# Patient Record
Sex: Male | Born: 1955
Health system: Southern US, Community
[De-identification: ages and names within clinical notes are randomized; demographics above are authoritative.]

## PROBLEM LIST (undated history)

## (undated) DIAGNOSIS — E7800 Pure hypercholesterolemia, unspecified: Secondary | ICD-10-CM

## (undated) DIAGNOSIS — H9190 Unspecified hearing loss, unspecified ear: Secondary | ICD-10-CM

## (undated) DIAGNOSIS — I639 Cerebral infarction, unspecified: Secondary | ICD-10-CM

## (undated) DIAGNOSIS — E559 Vitamin D deficiency, unspecified: Secondary | ICD-10-CM

## (undated) DIAGNOSIS — N189 Chronic kidney disease, unspecified: Secondary | ICD-10-CM

## (undated) DIAGNOSIS — K219 Gastro-esophageal reflux disease without esophagitis: Secondary | ICD-10-CM

## (undated) DIAGNOSIS — J449 Chronic obstructive pulmonary disease, unspecified: Secondary | ICD-10-CM

## (undated) DIAGNOSIS — I10 Essential (primary) hypertension: Secondary | ICD-10-CM

## (undated) HISTORY — PX: CHOLECYSTECTOMY: SHX55

## (undated) HISTORY — PX: TONSILLECTOMY: SUR1361

## (undated) HISTORY — PX: ELBOW FRACTURE SURGERY: SHX616

## (undated) HISTORY — PX: HERNIA REPAIR: SHX51

## (undated) HISTORY — PX: CATARACT EXTRACTION: SUR2

## (undated) HISTORY — PX: SPLENECTOMY, TOTAL: SHX788

## (undated) HISTORY — PX: VOCAL CORD INJECTION: SHX2663

## (undated) HISTORY — PX: HYDROCELE EXCISION / REPAIR: SUR1145

---

## 2009-02-16 ENCOUNTER — Encounter (INDEPENDENT_AMBULATORY_CARE_PROVIDER_SITE_OTHER): Payer: Self-pay | Admitting: Otolaryngology

## 2009-02-16 ENCOUNTER — Ambulatory Visit (HOSPITAL_COMMUNITY): Admission: RE | Admit: 2009-02-16 | Discharge: 2009-02-16 | Payer: Self-pay | Admitting: Otolaryngology

## 2011-02-13 LAB — CBC
HCT: 44.7 % (ref 39.0–52.0)
Hemoglobin: 15.2 g/dL (ref 13.0–17.0)
MCHC: 34 g/dL (ref 30.0–36.0)
MCV: 92.9 fL (ref 78.0–100.0)
Platelets: 212 10*3/uL (ref 150–400)
RBC: 4.81 MIL/uL (ref 4.22–5.81)
RDW: 13.8 % (ref 11.5–15.5)
WBC: 9.6 10*3/uL (ref 4.0–10.5)

## 2011-02-13 LAB — BASIC METABOLIC PANEL
CO2: 23 mEq/L (ref 19–32)
Calcium: 9.2 mg/dL (ref 8.4–10.5)
Creatinine, Ser: 0.97 mg/dL (ref 0.4–1.5)
GFR calc Af Amer: >60 mL/min (ref >60)
GFR calc non Af Amer: >60 mL/min (ref >60)
Sodium: 138 mEq/L (ref 135–145)

## 2011-03-19 NOTE — Op Note (Signed)
NAME:  Ernest Barnett, Ernest Barnett                ACCOUNT NO.:  [id number]   MEDICAL RECORD NO.:  [id number]          PATIENT TYPE:  AMB   LOCATION:  SDS                          FACILITY:  [facility]   PHYSICIAN:  Melissa Montane, M.D.       DATE OF BIRTH:  18-Dec-1955   DATE OF PROCEDURE:  02/16/2009  DATE OF DISCHARGE:  02/16/2009                               OPERATIVE REPORT   PREOPERATIVE DIAGNOSIS:  Right large vocal cord polyp.   POSTOPERATIVE DIAGNOSIS:  Right large vocal cord polyp.   SURGICAL PROCEDURE:  Microlaryngoscopy with removal of polyp and  microflap.   ANESTHESIA:  General.   ESTIMATED BLOOD LOSS:  Less than 5 mL.   INDICATIONS:  This is a 55 year old with extremely significant  hoarseness and large polyp on his vocal cord that is ball-valving in the  glottis.  He wants to have this removed.  We discussed the procedure.  We discussed risks and benefits as well as options.  All his questions  were answered and consent was obtained.   OPERATION:  The patient was taken to the operating room and placed  supine position.  After general endotracheal tube anesthesia with a  small #6 tube, the patient was examined with the [facility] scope.  It was  then positioned in the larynx and secured and suspended from the [place]  stand.  Using the microscissors, the polyp was grasped and dissection  was started on the lateral aspect of the vocal cord.  The dissection was  done to flay the polyp medially and then starting from the top it was  dissected using the suction to remove some of the thickened material as  well as dissection with the scissors.  Once this was removed, it was a  very large polyp.  The flap was then laid back down over the raw surface  of the vocal cord creating a nice vocal fold edge.  Blood was all  suctioned out and hemostasis was achieved with a pledget.  This was  soaked with adrenaline.  The patient was then examined.  There was no  other lesions identified in his larynx or  hypopharynx.  Awakened and  brought to recovery room in stable condition.  Counts correct.           ______________________________  Melissa Montane, M.D.     JB/MEDQ  D:  02/16/2009  T:  02/17/2009  Job:  [id number]

## 2011-11-05 DIAGNOSIS — I639 Cerebral infarction, unspecified: Secondary | ICD-10-CM

## 2011-11-05 HISTORY — DX: Cerebral infarction, unspecified: I63.9

## 2012-03-24 DIAGNOSIS — I639 Cerebral infarction, unspecified: Secondary | ICD-10-CM

## 2012-03-24 HISTORY — DX: Cerebral infarction, unspecified: I63.9

## 2012-10-06 ENCOUNTER — Encounter (HOSPITAL_COMMUNITY): Payer: Self-pay | Admitting: Pharmacy Technician

## 2012-10-06 ENCOUNTER — Encounter (HOSPITAL_COMMUNITY): Payer: Self-pay

## 2012-10-06 ENCOUNTER — Encounter (HOSPITAL_COMMUNITY)
Admission: RE | Admit: 2012-10-06 | Discharge: 2012-10-06 | Disposition: A | Discharge status: Home / Self Care | Payer: Medicaid Other | Source: Ambulatory Visit | Attending: Ophthalmology | Admitting: Ophthalmology

## 2012-10-06 HISTORY — DX: Unspecified hearing loss, unspecified ear: H91.90

## 2012-10-06 HISTORY — DX: Vitamin D deficiency, unspecified: E55.9

## 2012-10-06 HISTORY — DX: Essential (primary) hypertension: I10

## 2012-10-06 HISTORY — DX: Chronic obstructive pulmonary disease, unspecified: J44.9

## 2012-10-06 HISTORY — DX: Cerebral infarction, unspecified: I63.9

## 2012-10-06 HISTORY — DX: Pure hypercholesterolemia, unspecified: E78.00

## 2012-10-06 LAB — BASIC METABOLIC PANEL
Chloride: 103 mEq/L (ref 96–112)
GFR calc Af Amer: 73 mL/min — ABNORMAL LOW (ref >90)
GFR calc non Af Amer: 63 mL/min — ABNORMAL LOW (ref >90)
Glucose, Bld: 105 mg/dL — ABNORMAL HIGH (ref 70–99)
Potassium: 4.4 mEq/L (ref 3.5–5.1)
Sodium: 138 mEq/L (ref 135–145)

## 2012-10-06 LAB — HEMOGLOBIN AND HEMATOCRIT, BLOOD: HCT: 42.4 % (ref 39.0–52.0)

## 2012-10-06 NOTE — Patient Instructions (Addendum)
Your procedure is scheduled on: 10/08/2012  Report to [facility] at 1200   PM.  Call this number if you have problems the morning of surgery: [phone]   Do not eat food or drink liquids :After Midnight.      Take these medicines the morning of surgery with A SIP OF WATER: lisinopril   Do not wear jewelry, make-up or nail polish.  Do not wear lotions, powders, or perfumes. You may wear deodorant.  Do not shave 48 hours prior to surgery.  Do not bring valuables to the hospital.  Contacts, dentures or bridgework may not be worn into surgery.  Leave suitcase in the car. After surgery it may be brought to your room.  For patients admitted to the hospital, checkout time is 11:00 AM the day of discharge.   Patients discharged the day of surgery will not be allowed to drive home.  :     Please read over the following fact sheets that you were given: Coughing and Deep Breathing, Surgical Site Infection Prevention, Anesthesia Post-op Instructions and Care and Recovery After Surgery    Cataract A cataract is a clouding of the lens of the eye. When a lens becomes cloudy, vision is reduced based on the degree and nature of the clouding. Many cataracts reduce vision to some degree. Some cataracts make people more near-sighted as they develop. Other cataracts increase glare. Cataracts that are ignored and become worse can sometimes look white. The white color can be seen through the pupil. CAUSES   Aging. However, cataracts may occur at any age, even in newborns.   Certain drugs.   Trauma to the eye.   Certain diseases such as diabetes.   Specific eye diseases such as chronic inflammation inside the eye or a sudden attack of a rare form of glaucoma.   Inherited or acquired medical problems.  SYMPTOMS   Gradual, progressive drop in vision in the affected eye.   Severe, rapid visual loss. This most often happens when trauma is the cause.  DIAGNOSIS  To detect a cataract, an eye doctor  examines the lens. Cataracts are best diagnosed with an exam of the eyes with the pupils enlarged (dilated) by drops.  TREATMENT  For an early cataract, vision may improve by using different eyeglasses or stronger lighting. If that does not help your vision, surgery is the only effective treatment. A cataract needs to be surgically removed when vision loss interferes with your everyday activities, such as driving, reading, or watching TV. A cataract may also have to be removed if it prevents examination or treatment of another eye problem. Surgery removes the cloudy lens and usually replaces it with a substitute lens (intraocular lens, IOL).  At a time when both you and your doctor agree, the cataract will be surgically removed. If you have cataracts in both eyes, only one is usually removed at a time. This allows the operated eye to heal and be out of danger from any possible problems after surgery (such as infection or poor wound healing). In rare cases, a cataract may be doing damage to your eye. In these cases, your caregiver may advise surgical removal right away. The vast majority of people who have cataract surgery have better vision afterward. HOME CARE INSTRUCTIONS  If you are not planning surgery, you may be asked to do the following:  Use different eyeglasses.   Use stronger or brighter lighting.   Ask your eye doctor about reducing your medicine dose or  changing medicines if it is thought that a medicine caused your cataract. Changing medicines does not make the cataract go away on its own.   Become familiar with your surroundings. Poor vision can lead to injury. Avoid bumping into things on the affected side. You are at a higher risk for tripping or falling.   Exercise extreme care when driving or operating machinery.   Wear sunglasses if you are sensitive to bright light or experiencing problems with glare.  SEEK IMMEDIATE MEDICAL CARE IF:   You have a worsening or sudden vision  loss.   You notice redness, swelling, or increasing pain in the eye.   You have a fever.  Document Released: 10/21/2005 Document Revised: 10/10/2011 Document Reviewed: 06/14/2011 [facility] Patient Information 2012 [place].PATIENT INSTRUCTIONS POST-ANESTHESIA  IMMEDIATELY FOLLOWING SURGERY:  Do not drive or operate machinery for the first twenty four hours after surgery.  Do not make any important decisions for twenty four hours after surgery or while taking narcotic pain medications or sedatives.  If you develop intractable nausea and vomiting or a severe headache please notify your doctor immediately.  FOLLOW-UP:  Please make an appointment with your surgeon as instructed. You do not need to follow up with anesthesia unless specifically instructed to do so.  WOUND CARE INSTRUCTIONS (if applicable):  Keep a dry clean dressing on the anesthesia/puncture wound site if there is drainage.  Once the wound has quit draining you may leave it open to air.  Generally you should leave the bandage intact for twenty four hours unless there is drainage.  If the epidural site drains for more than 36-48 hours please call the anesthesia department.  QUESTIONS?:  Please feel free to call your physician or the hospital operator if you have any questions, and they will be happy to assist you.

## 2012-10-07 MED ORDER — NEOMYCIN-POLYMYXIN-DEXAMETH 3.5-10000-0.1 OP OINT
TOPICAL_OINTMENT | OPHTHALMIC | Status: AC
  Filled 2012-10-07: qty 3.5

## 2012-10-07 MED ORDER — PHENYLEPHRINE HCL 2.5 % OP SOLN
OPHTHALMIC | Status: AC
  Filled 2012-10-07: qty 2

## 2012-10-07 MED ORDER — TETRACAINE HCL 0.5 % OP SOLN
OPHTHALMIC | Status: AC
  Filled 2012-10-07: qty 2

## 2012-10-07 MED ORDER — LIDOCAINE HCL 3.5 % OP GEL
OPHTHALMIC | Status: AC
  Filled 2012-10-07: qty 5

## 2012-10-07 MED ORDER — LIDOCAINE HCL (PF) 1 % IJ SOLN
INTRAMUSCULAR | Status: AC
  Filled 2012-10-07: qty 2

## 2012-10-08 ENCOUNTER — Ambulatory Visit (HOSPITAL_COMMUNITY)
Admission: RE | Admit: 2012-10-08 | Discharge: 2012-10-08 | Disposition: A | Discharge status: Home / Self Care | Payer: Medicaid Other | Source: Ambulatory Visit | Attending: Ophthalmology | Admitting: Ophthalmology

## 2012-10-08 ENCOUNTER — Ambulatory Visit (HOSPITAL_COMMUNITY): Payer: Medicaid Other | Admitting: Anesthesiology

## 2012-10-08 ENCOUNTER — Encounter (HOSPITAL_COMMUNITY): Payer: Self-pay | Admitting: *Deleted

## 2012-10-08 ENCOUNTER — Encounter (HOSPITAL_COMMUNITY): Payer: Self-pay | Admitting: Anesthesiology

## 2012-10-08 ENCOUNTER — Encounter (HOSPITAL_COMMUNITY)
Admission: RE | Disposition: A | Discharge status: Home / Self Care | Payer: Self-pay | Source: Ambulatory Visit | Attending: Ophthalmology

## 2012-10-08 ENCOUNTER — Encounter (HOSPITAL_COMMUNITY): Payer: Self-pay | Admitting: Ophthalmology

## 2012-10-08 DIAGNOSIS — Z01810 Encounter for preprocedural cardiovascular examination: Secondary | ICD-10-CM | POA: Insufficient documentation

## 2012-10-08 DIAGNOSIS — H2589 Other age-related cataract: Secondary | ICD-10-CM | POA: Insufficient documentation

## 2012-10-08 DIAGNOSIS — Z01812 Encounter for preprocedural laboratory examination: Secondary | ICD-10-CM | POA: Insufficient documentation

## 2012-10-08 DIAGNOSIS — I10 Essential (primary) hypertension: Secondary | ICD-10-CM | POA: Insufficient documentation

## 2012-10-08 DIAGNOSIS — J449 Chronic obstructive pulmonary disease, unspecified: Secondary | ICD-10-CM | POA: Insufficient documentation

## 2012-10-08 DIAGNOSIS — F17200 Nicotine dependence, unspecified, uncomplicated: Secondary | ICD-10-CM | POA: Insufficient documentation

## 2012-10-08 DIAGNOSIS — J4489 Other specified chronic obstructive pulmonary disease: Secondary | ICD-10-CM | POA: Insufficient documentation

## 2012-10-08 HISTORY — PX: CATARACT EXTRACTION W/PHACO: SHX586

## 2012-10-08 SURGERY — PHACOEMULSIFICATION, CATARACT, WITH IOL INSERTION
Anesthesia: Monitor Anesthesia Care | Site: Eye | Laterality: Left | Wound class: Clean

## 2012-10-08 MED ORDER — LIDOCAINE HCL (PF) 1 % IJ SOLN
INTRAOCULAR | Status: DC | PRN
  Administered 2012-10-08: 13:00:00 via OPHTHALMIC

## 2012-10-08 MED ORDER — LACTATED RINGERS IV SOLN
INTRAVENOUS | Status: DC
  Administered 2012-10-08: 13:00:00 via INTRAVENOUS

## 2012-10-08 MED ORDER — POVIDONE-IODINE 5 % OP SOLN
OPHTHALMIC | Status: DC | PRN
  Administered 2012-10-08: 1 via OPHTHALMIC

## 2012-10-08 MED ORDER — CYCLOPENTOLATE-PHENYLEPHRINE 0.2-1 % OP SOLN
1.0000 [drp] | OPHTHALMIC | Status: AC
  Administered 2012-10-08 (×3): 1 [drp] via OPHTHALMIC

## 2012-10-08 MED ORDER — EPINEPHRINE HCL 1 MG/ML IJ SOLN
INTRAMUSCULAR | Status: AC
  Filled 2012-10-08: qty 1

## 2012-10-08 MED ORDER — MIDAZOLAM HCL 5 MG/5ML IJ SOLN
INTRAMUSCULAR | Status: AC
  Filled 2012-10-08: qty 5

## 2012-10-08 MED ORDER — LIDOCAINE 3.5 % OP GEL OPTIME - NO CHARGE
OPHTHALMIC | Status: DC | PRN
  Administered 2012-10-08: 2 [drp] via OPHTHALMIC

## 2012-10-08 MED ORDER — NEOMYCIN-POLYMYXIN-DEXAMETH 0.1 % OP OINT
TOPICAL_OINTMENT | OPHTHALMIC | Status: DC | PRN
  Administered 2012-10-08: 1 via OPHTHALMIC

## 2012-10-08 MED ORDER — EPINEPHRINE HCL 1 MG/ML IJ SOLN
INTRAOCULAR | Status: DC | PRN
  Administered 2012-10-08: 13:00:00

## 2012-10-08 MED ORDER — PHENYLEPHRINE HCL 2.5 % OP SOLN
1.0000 [drp] | OPHTHALMIC | Status: AC
  Administered 2012-10-08 (×3): 1 [drp] via OPHTHALMIC

## 2012-10-08 MED ORDER — BSS IO SOLN
INTRAOCULAR | Status: DC | PRN
  Administered 2012-10-08: 15 mL via INTRAOCULAR

## 2012-10-08 MED ORDER — LIDOCAINE HCL 3.5 % OP GEL
1.0000 "application " | Freq: Once | OPHTHALMIC | Status: AC
  Administered 2012-10-08: 1 via OPHTHALMIC

## 2012-10-08 MED ORDER — TETRACAINE HCL 0.5 % OP SOLN
1.0000 [drp] | OPHTHALMIC | Status: AC
  Administered 2012-10-08 (×3): 1 [drp] via OPHTHALMIC

## 2012-10-08 MED ORDER — MIDAZOLAM HCL 2 MG/2ML IJ SOLN
1.0000 mg | INTRAMUSCULAR | Status: DC | PRN
  Administered 2012-10-08: 2 mg via INTRAVENOUS

## 2012-10-08 MED ORDER — PROVISC 10 MG/ML IO SOLN
INTRAOCULAR | Status: DC | PRN
  Administered 2012-10-08: 8.5 mg via INTRAOCULAR

## 2012-10-08 SURGICAL SUPPLY — 32 items

## 2012-10-08 NOTE — Brief Op Note (Signed)
Pre-Op Dx: Cataract OS Post-Op Dx: Cataract OS Surgeon: Tonny Branch Anesthesia: Topical with MAC Surgery: Cataract Extraction with Intraocular lens Implant OS Implant: Alcon [id number], 19.5D Specimen: None Complications: None

## 2012-10-08 NOTE — H&P (Signed)
I have reviewed the H&P, the patient was re-examined, and I have identified no interval changes in medical condition and plan of care since the history and physical of record  

## 2012-10-08 NOTE — Transfer of Care (Signed)
Immediate Anesthesia Transfer of Care Note  Patient: Ernest Barnett  Procedure(s) Performed: Procedure(s) (LRB) with comments: CATARACT EXTRACTION PHACO AND INTRAOCULAR LENS PLACEMENT (IOC) (Left) - CDE:6.64  Patient Location: PACU and Short Stay  Anesthesia Type:MAC  Level of Consciousness: awake  Airway & Oxygen Therapy: Patient Spontanous Breathing  Post-op Assessment: Report given to PACU RN  Post vital signs: Reviewed and stable  Complications: No apparent anesthesia complications

## 2012-10-08 NOTE — Anesthesia Postprocedure Evaluation (Signed)
  Anesthesia Post-op Note  Patient: Ernest Barnett  Procedure(s) Performed: Procedure(s) (LRB) with comments: CATARACT EXTRACTION PHACO AND INTRAOCULAR LENS PLACEMENT (IOC) (Left) - CDE:6.64  Patient Location: PACU and Short Stay  Anesthesia Type:MAC  Level of Consciousness: awake, alert  and oriented  Airway and Oxygen Therapy: Patient Spontanous Breathing  Post-op Pain: none  Post-op Assessment: Post-op Vital signs reviewed, Patient's Cardiovascular Status Stable, Respiratory Function Stable, Patent Airway and No signs of Nausea or vomiting  Post-op Vital Signs: Reviewed and stable  Complications: No apparent anesthesia complications

## 2012-10-08 NOTE — Anesthesia Preprocedure Evaluation (Addendum)
Anesthesia Evaluation  Patient identified by MRN, date of birth, ID band Patient awake    Reviewed: Allergy & Precautions, H&P , NPO status , Patient's Chart, lab work & pertinent test results  Airway Mallampati: I TM Distance: >3 FB     Dental  (+) Edentulous Upper and Edentulous Lower   Pulmonary COPD COPD inhaler, Current Smoker,  breath sounds clear to auscultation        Cardiovascular hypertension, Pt. on medications Rhythm:Regular Rate:Normal     Neuro/Psych CVA (Left hemiparesis), Residual Symptoms    GI/Hepatic   Endo/Other    Renal/GU      Musculoskeletal   Abdominal   Peds  Hematology   Anesthesia Other Findings   Reproductive/Obstetrics                           Anesthesia Physical Anesthesia Plan  ASA: III  Anesthesia Plan: MAC   Post-op Pain Management:    Induction: Intravenous  Airway Management Planned: Nasal Cannula  Additional Equipment:   Intra-op Plan:   Post-operative Plan:   Informed Consent: I have reviewed the patients History and Physical, chart, labs and discussed the procedure including the risks, benefits and alternatives for the proposed anesthesia with the patient or authorized representative who has indicated his/her understanding and acceptance.     Plan Discussed with:   Anesthesia Plan Comments:         Anesthesia Quick Evaluation

## 2012-10-09 NOTE — Op Note (Signed)
NAME:  SIMSLaterrence, Bennight NO.:  [id number]  MEDICAL RECORD NO.:  [id number]  LOCATION:  APPO                          FACILITY:  [facility]  PHYSICIAN:  Richardo Hanks, MD       DATE OF BIRTH:  1956-06-28  DATE OF PROCEDURE:  10/08/2012 DATE OF DISCHARGE:  10/08/2012                              OPERATIVE REPORT   PREOPERATIVE DIAGNOSIS:  Combined cataract, left eye, diagnosis code 366.19.  POSTOPERATIVE DIAGNOSIS:  Combined cataract, left eye, diagnosis code 366.19.  OPERATION PERFORMED:  Phacoemulsification with posterior chamber intraocular lens implantation, left eye.  SURGEON:  Franky Macho. Anuja Manka, MD  ANESTHESIA:  Topical with monitored anesthesia care and IV sedation.  OPERATIVE SUMMARY:  In the preoperative area, dilating drops were placed into the left eye.  The patient was then brought into the operating room where he was placed under general anesthesia.  The eye was then prepped and draped.  Beginning with a 75 blade, a paracentesis port was made at the surgeon's 2 o'clock position.  The anterior chamber was then filled with a 1% nonpreserved lidocaine solution with epinephrine.  This was followed by Viscoat to deepen the chamber.  A small fornix-based peritomy was performed superiorly.  Next, a single iris hook was placed through the limbus superiorly.  A 2.4-mm keratome blade was then used to make a clear corneal incision over the iris hook.  A bent cystotome needle and Utrata forceps were used to create a continuous tear capsulotomy.  Hydrodissection was performed using balanced salt solution on a fine cannula.  The lens nucleus was then removed using phacoemulsification in a quadrant cracking technique.  The cortical material was then removed with irrigation and aspiration.  The capsular bag and anterior chamber were refilled with Provisc.  The wound was widened to approximately 3 mm and a posterior chamber intraocular lens was placed into the capsular bag  without difficulty using an [employer] lens injecting system.  A single 10-0 nylon suture was then used to close the incision as well as stromal hydration.  The Provisc was removed from the anterior chamber and capsular bag with irrigation and aspiration.  At this point, the wounds were tested for leak, which were negative.  The anterior chamber remained deep and stable.  The patient tolerated the procedure well.  There were no operative complications, and he awoke from general anesthesia without problem.  No surgical specimens.  Prosthetic device used, Alcon AcrySof posterior chamber lens, model SN60WF power of 19.5, serial number is [id number].          ______________________________ Richardo Hanks, MD     KEH/MEDQ  D:  10/08/2012  T:  10/09/2012  Job:  [id number]

## 2012-10-12 ENCOUNTER — Encounter (HOSPITAL_COMMUNITY): Payer: Self-pay | Admitting: Ophthalmology

## 2015-07-07 ENCOUNTER — Encounter: Payer: Self-pay | Admitting: Internal Medicine

## 2015-11-13 DIAGNOSIS — M25552 Pain in left hip: Secondary | ICD-10-CM | POA: Diagnosis not present

## 2015-11-13 DIAGNOSIS — E782 Mixed hyperlipidemia: Secondary | ICD-10-CM | POA: Diagnosis not present

## 2015-11-13 DIAGNOSIS — I10 Essential (primary) hypertension: Secondary | ICD-10-CM | POA: Diagnosis not present

## 2015-12-25 DIAGNOSIS — E782 Mixed hyperlipidemia: Secondary | ICD-10-CM | POA: Diagnosis not present

## 2015-12-25 DIAGNOSIS — Z0000 Encounter for general adult medical examination without abnormal findings: Secondary | ICD-10-CM | POA: Diagnosis not present

## 2015-12-25 DIAGNOSIS — I10 Essential (primary) hypertension: Secondary | ICD-10-CM | POA: Diagnosis not present

## 2016-01-01 DIAGNOSIS — M14862 Arthropathies in other specified diseases classified elsewhere, left knee: Secondary | ICD-10-CM | POA: Diagnosis not present

## 2016-03-19 DIAGNOSIS — L03311 Cellulitis of abdominal wall: Secondary | ICD-10-CM | POA: Diagnosis not present

## 2016-03-19 DIAGNOSIS — M14862 Arthropathies in other specified diseases classified elsewhere, left knee: Secondary | ICD-10-CM | POA: Diagnosis not present

## 2016-03-19 DIAGNOSIS — I10 Essential (primary) hypertension: Secondary | ICD-10-CM | POA: Diagnosis not present

## 2016-04-05 DIAGNOSIS — J168 Pneumonia due to other specified infectious organisms: Secondary | ICD-10-CM | POA: Diagnosis not present

## 2016-07-09 DIAGNOSIS — J4521 Mild intermittent asthma with (acute) exacerbation: Secondary | ICD-10-CM | POA: Diagnosis not present

## 2016-07-09 DIAGNOSIS — I10 Essential (primary) hypertension: Secondary | ICD-10-CM | POA: Diagnosis not present

## 2016-07-09 DIAGNOSIS — Z1389 Encounter for screening for other disorder: Secondary | ICD-10-CM | POA: Diagnosis not present

## 2016-07-09 DIAGNOSIS — N4289 Other specified disorders of prostate: Secondary | ICD-10-CM | POA: Diagnosis not present

## 2016-07-09 DIAGNOSIS — Z0000 Encounter for general adult medical examination without abnormal findings: Secondary | ICD-10-CM | POA: Diagnosis not present

## 2016-10-14 DIAGNOSIS — I10 Essential (primary) hypertension: Secondary | ICD-10-CM | POA: Diagnosis not present

## 2016-10-14 DIAGNOSIS — N181 Chronic kidney disease, stage 1: Secondary | ICD-10-CM | POA: Diagnosis not present

## 2016-10-14 DIAGNOSIS — J4521 Mild intermittent asthma with (acute) exacerbation: Secondary | ICD-10-CM | POA: Diagnosis not present

## 2016-10-14 DIAGNOSIS — N4289 Other specified disorders of prostate: Secondary | ICD-10-CM | POA: Diagnosis not present

## 2017-02-04 DIAGNOSIS — N4289 Other specified disorders of prostate: Secondary | ICD-10-CM | POA: Diagnosis not present

## 2017-02-04 DIAGNOSIS — J4521 Mild intermittent asthma with (acute) exacerbation: Secondary | ICD-10-CM | POA: Diagnosis not present

## 2017-02-04 DIAGNOSIS — I10 Essential (primary) hypertension: Secondary | ICD-10-CM | POA: Diagnosis not present

## 2017-02-04 DIAGNOSIS — N181 Chronic kidney disease, stage 1: Secondary | ICD-10-CM | POA: Diagnosis not present

## 2017-02-05 DIAGNOSIS — Z125 Encounter for screening for malignant neoplasm of prostate: Secondary | ICD-10-CM | POA: Diagnosis not present

## 2017-02-05 DIAGNOSIS — N181 Chronic kidney disease, stage 1: Secondary | ICD-10-CM | POA: Diagnosis not present

## 2017-02-05 DIAGNOSIS — I10 Essential (primary) hypertension: Secondary | ICD-10-CM | POA: Diagnosis not present

## 2017-05-09 DIAGNOSIS — N181 Chronic kidney disease, stage 1: Secondary | ICD-10-CM | POA: Diagnosis not present

## 2017-05-09 DIAGNOSIS — I10 Essential (primary) hypertension: Secondary | ICD-10-CM | POA: Diagnosis not present

## 2017-05-09 DIAGNOSIS — N4289 Other specified disorders of prostate: Secondary | ICD-10-CM | POA: Diagnosis not present

## 2017-05-09 DIAGNOSIS — J4521 Mild intermittent asthma with (acute) exacerbation: Secondary | ICD-10-CM | POA: Diagnosis not present

## 2017-06-30 DIAGNOSIS — K611 Rectal abscess: Secondary | ICD-10-CM | POA: Diagnosis not present

## 2017-10-09 DIAGNOSIS — I10 Essential (primary) hypertension: Secondary | ICD-10-CM | POA: Diagnosis not present

## 2017-10-09 DIAGNOSIS — J4521 Mild intermittent asthma with (acute) exacerbation: Secondary | ICD-10-CM | POA: Diagnosis not present

## 2017-10-09 DIAGNOSIS — N181 Chronic kidney disease, stage 1: Secondary | ICD-10-CM | POA: Diagnosis not present

## 2017-10-09 DIAGNOSIS — N4289 Other specified disorders of prostate: Secondary | ICD-10-CM | POA: Diagnosis not present

## 2017-10-16 ENCOUNTER — Encounter: Payer: Self-pay | Admitting: Internal Medicine

## 2017-10-16 DIAGNOSIS — N181 Chronic kidney disease, stage 1: Secondary | ICD-10-CM | POA: Diagnosis not present

## 2017-10-16 DIAGNOSIS — J4521 Mild intermittent asthma with (acute) exacerbation: Secondary | ICD-10-CM | POA: Diagnosis not present

## 2017-10-16 DIAGNOSIS — N4289 Other specified disorders of prostate: Secondary | ICD-10-CM | POA: Diagnosis not present

## 2017-10-16 DIAGNOSIS — I10 Essential (primary) hypertension: Secondary | ICD-10-CM | POA: Diagnosis not present

## 2017-11-04 HISTORY — PX: RETINAL DETACHMENT SURGERY: SHX105

## 2018-01-07 DIAGNOSIS — N181 Chronic kidney disease, stage 1: Secondary | ICD-10-CM | POA: Diagnosis not present

## 2018-01-07 DIAGNOSIS — J4521 Mild intermittent asthma with (acute) exacerbation: Secondary | ICD-10-CM | POA: Diagnosis not present

## 2018-01-07 DIAGNOSIS — I10 Essential (primary) hypertension: Secondary | ICD-10-CM | POA: Diagnosis not present

## 2018-01-07 DIAGNOSIS — N4289 Other specified disorders of prostate: Secondary | ICD-10-CM | POA: Diagnosis not present

## 2018-01-16 DIAGNOSIS — H33059 Total retinal detachment, unspecified eye: Secondary | ICD-10-CM | POA: Diagnosis not present

## 2018-01-16 DIAGNOSIS — H33022 Retinal detachment with multiple breaks, left eye: Secondary | ICD-10-CM | POA: Diagnosis not present

## 2018-01-16 DIAGNOSIS — Z961 Presence of intraocular lens: Secondary | ICD-10-CM | POA: Diagnosis not present

## 2018-01-16 DIAGNOSIS — H26491 Other secondary cataract, right eye: Secondary | ICD-10-CM | POA: Diagnosis not present

## 2018-01-19 DIAGNOSIS — H33022 Retinal detachment with multiple breaks, left eye: Secondary | ICD-10-CM | POA: Diagnosis not present

## 2018-02-10 ENCOUNTER — Telehealth (INDEPENDENT_AMBULATORY_CARE_PROVIDER_SITE_OTHER): Payer: Self-pay | Admitting: *Deleted

## 2018-02-10 NOTE — Telephone Encounter (Signed)
Opened in error

## 2018-02-16 DIAGNOSIS — H33059 Total retinal detachment, unspecified eye: Secondary | ICD-10-CM | POA: Diagnosis not present

## 2018-02-16 DIAGNOSIS — H33022 Retinal detachment with multiple breaks, left eye: Secondary | ICD-10-CM | POA: Diagnosis not present

## 2018-02-16 DIAGNOSIS — H26491 Other secondary cataract, right eye: Secondary | ICD-10-CM | POA: Diagnosis not present

## 2018-02-16 DIAGNOSIS — H3342 Traction detachment of retina, left eye: Secondary | ICD-10-CM | POA: Diagnosis not present

## 2018-03-02 DIAGNOSIS — H33012 Retinal detachment with single break, left eye: Secondary | ICD-10-CM | POA: Diagnosis not present

## 2018-03-02 DIAGNOSIS — H3342 Traction detachment of retina, left eye: Secondary | ICD-10-CM | POA: Diagnosis not present

## 2018-03-02 DIAGNOSIS — H33052 Total retinal detachment, left eye: Secondary | ICD-10-CM | POA: Diagnosis not present

## 2018-03-10 DIAGNOSIS — H3342 Traction detachment of retina, left eye: Secondary | ICD-10-CM | POA: Diagnosis not present

## 2018-03-31 ENCOUNTER — Encounter (INDEPENDENT_AMBULATORY_CARE_PROVIDER_SITE_OTHER): Payer: Self-pay | Admitting: Internal Medicine

## 2018-03-31 ENCOUNTER — Ambulatory Visit (INDEPENDENT_AMBULATORY_CARE_PROVIDER_SITE_OTHER): Payer: Medicare Other | Admitting: Internal Medicine

## 2018-03-31 ENCOUNTER — Ambulatory Visit (INDEPENDENT_AMBULATORY_CARE_PROVIDER_SITE_OTHER): Payer: Self-pay | Admitting: Internal Medicine

## 2018-03-31 VITALS — BP 122/90 | HR 70 | Temp 97.8°F | Resp 18 | Ht 67.0 in | Wt 216.7 lb

## 2018-03-31 DIAGNOSIS — Z86010 Personal history of colonic polyps: Secondary | ICD-10-CM | POA: Diagnosis not present

## 2018-03-31 DIAGNOSIS — K5909 Other constipation: Secondary | ICD-10-CM | POA: Diagnosis not present

## 2018-03-31 MED ORDER — POLYETHYLENE GLYCOL 3350 17 G PO PACK
17.0000 g | PACK | Freq: Every day | ORAL | 5 refills | Status: DC
Start: 2018-03-31 — End: 2022-11-08
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
## 2018-03-31 NOTE — Patient Instructions (Signed)
High-fiber diet. Take polyethylene glycol/MiraLAX on schedule which can be every day or every other day.  Can adjust dose as discussed. Will request copy of recent blood work from Dr. Joselyn Arrow office Colonoscopy to be scheduled in September 2019.

## 2018-04-14 DIAGNOSIS — N4289 Other specified disorders of prostate: Secondary | ICD-10-CM | POA: Diagnosis not present

## 2018-04-14 DIAGNOSIS — J4521 Mild intermittent asthma with (acute) exacerbation: Secondary | ICD-10-CM | POA: Diagnosis not present

## 2018-04-14 DIAGNOSIS — I10 Essential (primary) hypertension: Secondary | ICD-10-CM | POA: Diagnosis not present

## 2018-04-14 DIAGNOSIS — N181 Chronic kidney disease, stage 1: Secondary | ICD-10-CM | POA: Diagnosis not present

## 2018-04-14 DIAGNOSIS — Z125 Encounter for screening for malignant neoplasm of prostate: Secondary | ICD-10-CM | POA: Diagnosis not present

## 2018-04-15 DIAGNOSIS — H3342 Traction detachment of retina, left eye: Secondary | ICD-10-CM | POA: Diagnosis not present

## 2018-04-15 DIAGNOSIS — H35352 Cystoid macular degeneration, left eye: Secondary | ICD-10-CM | POA: Diagnosis not present

## 2018-06-09 DIAGNOSIS — H3342 Traction detachment of retina, left eye: Secondary | ICD-10-CM | POA: Diagnosis not present

## 2018-06-09 DIAGNOSIS — H35352 Cystoid macular degeneration, left eye: Secondary | ICD-10-CM | POA: Diagnosis not present

## 2018-06-09 DIAGNOSIS — H35372 Puckering of macula, left eye: Secondary | ICD-10-CM | POA: Diagnosis not present

## 2018-06-09 DIAGNOSIS — H33022 Retinal detachment with multiple breaks, left eye: Secondary | ICD-10-CM | POA: Diagnosis not present

## 2018-06-24 DIAGNOSIS — H35372 Puckering of macula, left eye: Secondary | ICD-10-CM | POA: Diagnosis not present

## 2018-06-24 DIAGNOSIS — Z9889 Other specified postprocedural states: Secondary | ICD-10-CM | POA: Diagnosis not present

## 2018-07-02 DIAGNOSIS — H35352 Cystoid macular degeneration, left eye: Secondary | ICD-10-CM | POA: Diagnosis not present

## 2018-07-02 DIAGNOSIS — H35372 Puckering of macula, left eye: Secondary | ICD-10-CM | POA: Diagnosis not present

## 2018-07-10 ENCOUNTER — Encounter (INDEPENDENT_AMBULATORY_CARE_PROVIDER_SITE_OTHER): Payer: Self-pay | Admitting: *Deleted

## 2018-07-16 DIAGNOSIS — H59032 Cystoid macular edema following cataract surgery, left eye: Secondary | ICD-10-CM | POA: Diagnosis not present

## 2018-07-16 DIAGNOSIS — H59812 Chorioretinal scars after surgery for detachment, left eye: Secondary | ICD-10-CM | POA: Diagnosis not present

## 2018-07-16 DIAGNOSIS — H524 Presbyopia: Secondary | ICD-10-CM | POA: Diagnosis not present

## 2018-07-27 DIAGNOSIS — J4521 Mild intermittent asthma with (acute) exacerbation: Secondary | ICD-10-CM | POA: Diagnosis not present

## 2018-07-27 DIAGNOSIS — Z1389 Encounter for screening for other disorder: Secondary | ICD-10-CM | POA: Diagnosis not present

## 2018-07-27 DIAGNOSIS — Z0000 Encounter for general adult medical examination without abnormal findings: Secondary | ICD-10-CM | POA: Diagnosis not present

## 2018-07-27 DIAGNOSIS — I10 Essential (primary) hypertension: Secondary | ICD-10-CM | POA: Diagnosis not present

## 2018-07-27 DIAGNOSIS — N181 Chronic kidney disease, stage 1: Secondary | ICD-10-CM | POA: Diagnosis not present

## 2018-08-06 ENCOUNTER — Other Ambulatory Visit (INDEPENDENT_AMBULATORY_CARE_PROVIDER_SITE_OTHER): Payer: Self-pay | Admitting: *Deleted

## 2018-08-06 DIAGNOSIS — R1310 Dysphagia, unspecified: Secondary | ICD-10-CM | POA: Insufficient documentation

## 2018-08-06 DIAGNOSIS — Z86010 Personal history of colonic polyps: Secondary | ICD-10-CM | POA: Insufficient documentation

## 2018-08-10 DIAGNOSIS — M10072 Idiopathic gout, left ankle and foot: Secondary | ICD-10-CM | POA: Diagnosis not present

## 2018-08-25 DIAGNOSIS — H35352 Cystoid macular degeneration, left eye: Secondary | ICD-10-CM | POA: Diagnosis not present

## 2018-08-25 DIAGNOSIS — H35372 Puckering of macula, left eye: Secondary | ICD-10-CM | POA: Diagnosis not present

## 2018-08-25 DIAGNOSIS — H3342 Traction detachment of retina, left eye: Secondary | ICD-10-CM | POA: Diagnosis not present

## 2018-10-06 ENCOUNTER — Telehealth (INDEPENDENT_AMBULATORY_CARE_PROVIDER_SITE_OTHER): Payer: Self-pay | Admitting: *Deleted

## 2018-10-06 ENCOUNTER — Encounter (INDEPENDENT_AMBULATORY_CARE_PROVIDER_SITE_OTHER): Payer: Self-pay | Admitting: *Deleted

## 2018-10-06 NOTE — Telephone Encounter (Signed)
Patient needs suprep 

## 2018-10-07 MED ORDER — SUPREP BOWEL PREP KIT 17.5-3.13-1.6 GM/177ML PO SOLN: 1.0000 | Freq: Once | ORAL | 0 refills | Status: AC

## 2018-10-14 ENCOUNTER — Telehealth (INDEPENDENT_AMBULATORY_CARE_PROVIDER_SITE_OTHER): Payer: Self-pay | Admitting: *Deleted

## 2018-10-14 NOTE — Telephone Encounter (Signed)
agree

## 2018-10-14 NOTE — Telephone Encounter (Signed)
Referring MD/PCP: hasanaj   Procedure: tcs/egd/ed  Reason/Indication:  Hx polyps, dysphagia  Has patient had this procedure before?  Yes, TCS 2016  If so, when, by whom and where?    Is there a family history of colon cancer?  no  Who?  What age when diagnosed?    Is patient diabetic?   no      Does patient have prosthetic heart valve or mechanical valve?  no  Do you have a pacemaker?  no  Has patient ever had endocarditis? no  Has patient had joint replacement within last 12 months?  no  Is patient constipated or do they take laxatives? no  Does patient have a history of alcohol/drug use?  no  Is patient on blood thinner such as Coumadin, Plavix and/or Aspirin? yes  Medications: asa 81 mg daily, fish oil 1000 mg tid, pravastatin 40 mg daily, tamsulosin 0.4 mg daily, sodium bicarb 10 mg bid  Allergies: nkda  Medication Adjustment per Dr Lindi Adie, NP: asa 2 dasy  Procedure date & time: 11/12/18 at 3524

## 2018-10-29 DIAGNOSIS — N181 Chronic kidney disease, stage 1: Secondary | ICD-10-CM | POA: Diagnosis not present

## 2018-10-29 DIAGNOSIS — M10072 Idiopathic gout, left ankle and foot: Secondary | ICD-10-CM | POA: Diagnosis not present

## 2018-10-29 DIAGNOSIS — I10 Essential (primary) hypertension: Secondary | ICD-10-CM | POA: Diagnosis not present

## 2018-10-29 DIAGNOSIS — E119 Type 2 diabetes mellitus without complications: Secondary | ICD-10-CM | POA: Diagnosis not present

## 2018-10-29 DIAGNOSIS — E785 Hyperlipidemia, unspecified: Secondary | ICD-10-CM | POA: Diagnosis not present

## 2018-11-12 ENCOUNTER — Ambulatory Visit (HOSPITAL_COMMUNITY)
Admission: RE | Admit: 2018-11-12 | Discharge: 2018-11-12 | Disposition: A | Discharge status: Home / Self Care | Payer: Medicare Other | Attending: Internal Medicine | Admitting: Internal Medicine

## 2018-11-12 ENCOUNTER — Encounter (HOSPITAL_COMMUNITY): Payer: Self-pay | Admitting: *Deleted

## 2018-11-12 ENCOUNTER — Other Ambulatory Visit: Payer: Self-pay

## 2018-11-12 ENCOUNTER — Encounter (HOSPITAL_COMMUNITY)
Admission: RE | Disposition: A | Discharge status: Home / Self Care | Payer: Self-pay | Source: Home / Self Care | Attending: Internal Medicine

## 2018-11-12 DIAGNOSIS — R1314 Dysphagia, pharyngoesophageal phase: Secondary | ICD-10-CM

## 2018-11-12 DIAGNOSIS — R1310 Dysphagia, unspecified: Secondary | ICD-10-CM

## 2018-11-12 DIAGNOSIS — K3189 Other diseases of stomach and duodenum: Secondary | ICD-10-CM

## 2018-11-12 DIAGNOSIS — Z79899 Other long term (current) drug therapy: Secondary | ICD-10-CM | POA: Diagnosis not present

## 2018-11-12 DIAGNOSIS — J449 Chronic obstructive pulmonary disease, unspecified: Secondary | ICD-10-CM | POA: Diagnosis not present

## 2018-11-12 DIAGNOSIS — F17210 Nicotine dependence, cigarettes, uncomplicated: Secondary | ICD-10-CM | POA: Diagnosis not present

## 2018-11-12 DIAGNOSIS — Z86010 Personal history of colonic polyps: Secondary | ICD-10-CM

## 2018-11-12 DIAGNOSIS — I10 Essential (primary) hypertension: Secondary | ICD-10-CM | POA: Diagnosis not present

## 2018-11-12 DIAGNOSIS — K210 Gastro-esophageal reflux disease with esophagitis: Secondary | ICD-10-CM | POA: Insufficient documentation

## 2018-11-12 DIAGNOSIS — D128 Benign neoplasm of rectum: Secondary | ICD-10-CM | POA: Diagnosis not present

## 2018-11-12 DIAGNOSIS — D123 Benign neoplasm of transverse colon: Secondary | ICD-10-CM

## 2018-11-12 DIAGNOSIS — Z7982 Long term (current) use of aspirin: Secondary | ICD-10-CM | POA: Insufficient documentation

## 2018-11-12 DIAGNOSIS — Q438 Other specified congenital malformations of intestine: Secondary | ICD-10-CM | POA: Diagnosis not present

## 2018-11-12 DIAGNOSIS — Z09 Encounter for follow-up examination after completed treatment for conditions other than malignant neoplasm: Secondary | ICD-10-CM

## 2018-11-12 DIAGNOSIS — K635 Polyp of colon: Secondary | ICD-10-CM | POA: Insufficient documentation

## 2018-11-12 DIAGNOSIS — K222 Esophageal obstruction: Secondary | ICD-10-CM | POA: Insufficient documentation

## 2018-11-12 DIAGNOSIS — K644 Residual hemorrhoidal skin tags: Secondary | ICD-10-CM

## 2018-11-12 DIAGNOSIS — D120 Benign neoplasm of cecum: Secondary | ICD-10-CM

## 2018-11-12 DIAGNOSIS — I69354 Hemiplegia and hemiparesis following cerebral infarction affecting left non-dominant side: Secondary | ICD-10-CM | POA: Diagnosis not present

## 2018-11-12 DIAGNOSIS — K449 Diaphragmatic hernia without obstruction or gangrene: Secondary | ICD-10-CM

## 2018-11-12 DIAGNOSIS — K621 Rectal polyp: Secondary | ICD-10-CM

## 2018-11-12 HISTORY — PX: COLONOSCOPY: SHX5424

## 2018-11-12 HISTORY — PX: ESOPHAGOGASTRODUODENOSCOPY: SHX5428

## 2018-11-12 HISTORY — PX: ESOPHAGEAL DILATION: SHX303

## 2018-11-12 HISTORY — DX: Cerebral infarction, unspecified: I63.9

## 2018-11-12 HISTORY — PX: BIOPSY: SHX5522

## 2018-11-12 HISTORY — PX: POLYPECTOMY: SHX5525

## 2018-11-12 SURGERY — COLONOSCOPY
Anesthesia: Moderate Sedation

## 2018-11-12 MED ORDER — MEPERIDINE HCL 50 MG/ML IJ SOLN
INTRAMUSCULAR | Status: AC
  Filled 2018-11-12: qty 1

## 2018-11-12 MED ORDER — MEPERIDINE HCL 50 MG/ML IJ SOLN
INTRAMUSCULAR | Status: DC | PRN
  Administered 2018-11-12: 25 mg

## 2018-11-12 MED ORDER — SODIUM CHLORIDE 0.9 % IV SOLN
INTRAVENOUS | Status: DC
  Administered 2018-11-12: 1000 mL via INTRAVENOUS

## 2018-11-12 MED ORDER — STERILE WATER FOR IRRIGATION IR SOLN
Status: DC | PRN
  Administered 2018-11-12: 11:00:00

## 2018-11-12 MED ORDER — MIDAZOLAM HCL 5 MG/5ML IJ SOLN
INTRAMUSCULAR | Status: DC | PRN
  Administered 2018-11-12 (×3): 2 mg via INTRAVENOUS

## 2018-11-12 MED ORDER — MIDAZOLAM HCL 5 MG/5ML IJ SOLN
INTRAMUSCULAR | Status: AC
  Filled 2018-11-12: qty 10

## 2018-11-12 MED ORDER — LIDOCAINE VISCOUS HCL 2 % MT SOLN
OROMUCOSAL | Status: DC | PRN
  Administered 2018-11-12: 1 via OROMUCOSAL

## 2018-11-12 MED ORDER — PANTOPRAZOLE SODIUM 40 MG PO TBEC: 40.0000 mg | DELAYED_RELEASE_TABLET | Freq: Two times a day (BID) | ORAL | 2 refills | Status: DC

## 2018-11-12 MED ORDER — LIDOCAINE VISCOUS HCL 2 % MT SOLN
OROMUCOSAL | Status: AC
  Filled 2018-11-12: qty 15

## 2018-11-12 NOTE — Op Note (Signed)
[facility] Patient Name: Ernest Barnett Procedure Date: 11/12/2018 10:07 AM MRN: [id number] Date of Birth: 06-Aug-1956 Attending MD: Hildred Laser , MD CSN: [id number] Age: 63 Admit Type: Outpatient Procedure:                Colonoscopy Indications:              High risk colon cancer surveillance: Personal                            history of colonic polyps Providers:                Hildred Laser, MD, Janeece Riggers, RN, Aram Candela Referring MD:             Stoney Bang, MD Medicines:                Midazolam 2 mg IV Complications:            No immediate complications. Estimated Blood Loss:     Estimated blood loss was minimal. Procedure:                After obtaining informed consent, the colonoscope                            was passed under direct vision. Throughout the                            procedure, the patient's blood pressure, pulse, and                            oxygen saturations were monitored continuously. The                            [id number]-[id number] ([id number]) scope was introduced through                            the anus and advanced to the the cecum, identified                            by appendiceal orifice and ileocecal valve. The                            colonoscopy was technically difficult and complex                            due to a redundant colon and a tortuous colon. The                            patient tolerated the procedure well. The quality                            of the bowel preparation was adequate. The                            ileocecal valve, appendiceal orifice, and rectum  were photographed. Scope In: 10:49:41 AM Scope Out: 11:25:40 AM Scope Withdrawal Time: 0 hours 17 minutes 17 seconds  Total Procedure Duration: 0 hours 35 minutes 59 seconds  Findings:      The perianal and digital rectal examinations were normal.      A small polyp was found in the cecum. The polyp was sessile. Biopsies   were taken with a cold forceps for histology. The pathology specimen was       placed into Bottle Number 1.      Two sessile polyps were found in the rectum and splenic flexure. The       polyps were small in size. These polyps were removed with a cold snare.       Resection and retrieval were complete. The pathology specimen was placed       into Bottle Number 1.      External hemorrhoids were found during retroflexion. Impression:               - One small polyp in the cecum. Biopsied.                           - Two small polyps in the rectum and at the splenic                            flexure, removed with a cold snare. Resected and                            retrieved.                           - External hemorrhoids. Moderate Sedation:      Moderate (conscious) sedation was administered by the endoscopy nurse       and supervised by the endoscopist. The following parameters were       monitored: oxygen saturation, heart rate, blood pressure, CO2       capnography and response to care. Total physician intraservice time was       36 minutes. Recommendation:           - Patient has a contact number available for                            emergencies. The signs and symptoms of potential                            delayed complications were discussed with the                            patient. Return to normal activities tomorrow.                            Written discharge instructions were provided to the                            patient.                           - Resume previous diet today.                           -  Continue present medications.                           - No aspirin, ibuprofen, naproxen, or other                            non-steroidal anti-inflammatory drugs for 1 day.                           - Await pathology results.                           - Repeat colonoscopy in 5 years for surveillance. Procedure Code(s):        --- Professional ---                            [phone], Colonoscopy, flexible; with removal of                            tumor(s), polyp(s), or other lesion(s) by snare                            technique                           45380, 59, Colonoscopy, flexible; with biopsy,                            single or multiple                           99153, Moderate sedation; each additional 15                            minutes intraservice time                           G0500, Moderate sedation services provided by the                            same physician or other qualified health care                            professional performing a gastrointestinal                            endoscopic service that sedation supports,                            requiring the presence of an independent trained                            observer to assist in the monitoring of the                            patient's level of consciousness and physiological  status; initial 15 minutes of intra-service time;                            patient age 45 years or older (additional time may                            be reported with [id number], as appropriate) Diagnosis Code(s):        --- Professional ---                           Z86.010, Personal history of colonic polyps                           D12.0, Benign neoplasm of cecum                           K62.1, Rectal polyp                           D12.3, Benign neoplasm of transverse colon (hepatic                            flexure or splenic flexure)                           K64.4, Residual hemorrhoidal skin tags CPT copyright 2018 [employer]. All rights reserved. The codes documented in this report are preliminary and upon coder review may  be revised to meet current compliance requirements. Hildred Laser, MD Hildred Laser, MD 11/12/2018 11:43:08 AM This report has been signed electronically. Number of Addenda: 0

## 2018-11-12 NOTE — Discharge Instructions (Signed)
No aspirin or NSAIDs for 24 hours. Resume other medications as before. Pantoprazole 40 mg by mouth 30 minutes before breakfast and evening meal daily. Resume usual diet. Antireflux measures sheet. No driving for 24 hours. Physician will call with biopsy results. Office visit in 2 months.  Gastroesophageal Reflux Disease, Adult Gastroesophageal reflux (GER) happens when acid from the stomach flows up into the tube that connects the mouth and the stomach (esophagus). Normally, food travels down the esophagus and stays in the stomach to be digested. However, when a person has GER, food and stomach acid sometimes move back up into the esophagus. If this becomes a more serious problem, the person may be diagnosed with a disease called gastroesophageal reflux disease (GERD). GERD occurs when the reflux:  Happens often.  Causes frequent or severe symptoms.  Causes problems such as damage to the esophagus. When stomach acid comes in contact with the esophagus, the acid may cause soreness (inflammation) in the esophagus. Over time, GERD may create small holes (ulcers) in the lining of the esophagus. What are the causes? This condition is caused by a problem with the muscle between the esophagus and the stomach (lower esophageal sphincter, or LES). Normally, the LES muscle closes after food passes through the esophagus to the stomach. When the LES is weakened or abnormal, it does not close properly, and that allows food and stomach acid to go back up into the esophagus. The LES can be weakened by certain dietary substances, medicines, and medical conditions, including:  Tobacco use.  Pregnancy.  Having a hiatal hernia.  Alcohol use.  Certain foods and beverages, such as coffee, chocolate, onions, and peppermint. What increases the risk? You are more likely to develop this condition if you:  Have an increased body weight.  Have a connective tissue disorder.  Use NSAID medicines. What are  the signs or symptoms? Symptoms of this condition include:  Heartburn.  Difficult or painful swallowing.  The feeling of having a lump in the throat.  Abitter taste in the mouth.  Bad breath.  Having a large amount of saliva.  Having an upset or bloated stomach.  Belching.  Chest pain. Different conditions can cause chest pain. Make sure you see your health care provider if you experience chest pain.  Shortness of breath or wheezing.  Ongoing (chronic) cough or a night-time cough.  Wearing away of tooth enamel.  Weight loss. How is this diagnosed? Your health care provider will take a medical history and perform a physical exam. To determine if you have mild or severe GERD, your health care provider may also monitor how you respond to treatment. You may also have tests, including:  A test to examine your stomach and esophagus with a small camera (endoscopy).  A test thatmeasures the acidity level in your esophagus.  A test thatmeasures how much pressure is on your esophagus.  A barium swallow or modified barium swallow test to show the shape, size, and functioning of your esophagus. How is this treated? The goal of treatment is to help relieve your symptoms and to prevent complications. Treatment for this condition may vary depending on how severe your symptoms are. Your health care provider may recommend:  Changes to your diet.  Medicine.  Surgery. Follow these instructions at home: Eating and drinking   Follow a diet as recommended by your health care provider. This may involve avoiding foods and drinks such as: ? Coffee and tea (with or without caffeine). ? Drinks that containalcohol. ?  Energy drinks and sports drinks. ? Carbonated drinks or sodas. ? Chocolate and cocoa. ? Peppermint and mint flavorings. ? Garlic and onions. ? Horseradish. ? Spicy and acidic foods, including peppers, chili powder, curry powder, vinegar, hot sauces, and barbecue  sauce. ? Citrus fruit juices and citrus fruits, such as oranges, lemons, and limes. ? Tomato-based foods, such as red sauce, chili, salsa, and pizza with red sauce. ? Fried and fatty foods, such as donuts, french fries, potato chips, and high-fat dressings. ? High-fat meats, such as hot dogs and fatty cuts of red and white meats, such as rib eye steak, sausage, ham, and bacon. ? High-fat dairy items, such as whole milk, butter, and cream cheese.  Eat small, frequent meals instead of large meals.  Avoid drinking large amounts of liquid with your meals.  Avoid eating meals during the 2-3 hours before bedtime.  Avoid lying down right after you eat.  Do not exercise right after you eat. Lifestyle   Do not use any products that contain nicotine or tobacco, such as cigarettes, e-cigarettes, and chewing tobacco. If you need help quitting, ask your health care provider.  Try to reduce your stress by using methods such as yoga or meditation. If you need help reducing stress, ask your health care provider.  If you are overweight, reduce your weight to an amount that is healthy for you. Ask your health care provider for guidance about a safe weight loss goal. General instructions  Pay attention to any changes in your symptoms.  Take over-the-counter and prescription medicines only as told by your health care provider. Do not take aspirin, ibuprofen, or other NSAIDs unless your health care provider told you to do so.  Wear loose-fitting clothing. Do not wear anything tight around your waist that causes pressure on your abdomen.  Raise (elevate) the head of your bed about 6 inches (15 cm).  Avoid bending over if this makes your symptoms worse.  Keep all follow-up visits as told by your health care provider. This is important. Contact a health care provider if:  You have: ? New symptoms. ? Unexplained weight loss. ? Difficulty swallowing or it hurts to swallow. ? Wheezing or a persistent  cough. ? A hoarse voice.  Your symptoms do not improve with treatment. Get help right away if you:  Have pain in your arms, neck, jaw, teeth, or back.  Feel sweaty, dizzy, or light-headed.  Have chest pain or shortness of breath.  Vomit and your vomit looks like blood or coffee grounds.  Faint.  Have stool that is bloody or black.  Cannot swallow, drink, or eat. Summary  Gastroesophageal reflux happens when acid from the stomach flows up into the esophagus. GERD is a disease in which the reflux happens often, causes frequent or severe symptoms, or causes problems such as damage to the esophagus.  Treatment for this condition may vary depending on how severe your symptoms are. Your health care provider may recommend diet and lifestyle changes, medicine, or surgery.  Contact a health care provider if you have new or worsening symptoms.  Take over-the-counter and prescription medicines only as told by your health care provider. Do not take aspirin, ibuprofen, or other NSAIDs unless your health care provider told you to do so.  Keep all follow-up visits as told by your health care provider. This is important. This information is not intended to replace advice given to you by your health care provider. Make sure you discuss any questions you have  with your health care provider. Document Released: 07/31/2005 Document Revised: 04/29/2018 Document Reviewed: 04/29/2018 Elsevier Interactive Patient Education  2019 [place] [facility], Adult, Care After This sheet gives you information about how to care for yourself after your procedure. Your health care provider may also give you more specific instructions. If you have problems or questions, contact your health care provider. What can I expect after the procedure? After the procedure, it is common to have:  A sore throat.  Mild stomach pain or discomfort.  Bloating.  Nausea. Follow these instructions at  home:   Follow instructions from your health care provider about what to eat or drink after your procedure.  Return to your normal activities as told by your health care provider. Ask your health care provider what activities are safe for you.  Take over-the-counter and prescription medicines only as told by your health care provider.  Do not drive for 24 hours if you were given a sedative during your procedure.  Keep all follow-up visits as told by your health care provider. This is important. Contact a health care provider if you have:  A sore throat that lasts longer than one day.  Trouble swallowing. Get help right away if:  You vomit blood or your vomit looks like coffee grounds.  You have: ? A fever. ? Bloody, black, or tarry stools. ? A severe sore throat or you cannot swallow. ? Difficulty breathing. ? Severe pain in your chest or abdomen. Summary  After the procedure, it is common to have a sore throat, mild stomach discomfort, bloating, and nausea.  Do not drive for 24 hours if you were given a sedative during the procedure.  Follow instructions from your health care provider about what to eat or drink after your procedure.  Return to your normal activities as told by your health care provider. This information is not intended to replace advice given to you by your health care provider. Make sure you discuss any questions you have with your health care provider. Document Released: 04/21/2012 Document Revised: 03/23/2018 Document Reviewed: 03/23/2018 Elsevier Interactive Patient Education  2019 [employer].  Colonoscopy, Adult, Care After This sheet gives you information about how to care for yourself after your procedure. Your health care provider may also give you more specific instructions. If you have problems or questions, contact your health care provider. What can I expect after the procedure? After the procedure, it is common to have: A small amount of  blood in your stool for 24 hours after the procedure. Some gas. Mild abdominal cramping or bloating. Follow these instructions at home: General instructions For the first 24 hours after the procedure: Do not drive or use machinery. Do not sign important documents. Do not drink alcohol. Do your regular daily activities at a slower pace than normal. Eat soft, easy-to-digest foods. Take over-the-counter or prescription medicines only as told by your health care provider. Relieving cramping and bloating  Try walking around when you have cramps or feel bloated. Apply heat to your abdomen as told by your health care provider. Use a heat source that your health care provider recommends, such as a moist heat pack or a heating pad. Place a towel between your skin and the heat source. Leave the heat on for 20-30 minutes. Remove the heat if your skin turns bright red. This is especially important if you are unable to feel pain, heat, or cold. You may have a greater risk of getting burned. Eating and  drinking  Drink enough fluid to keep your urine pale yellow. Resume your normal diet as instructed by your health care provider. Avoid heavy or fried foods that are hard to digest. Avoid drinking alcohol for as long as instructed by your health care provider. Contact a health care provider if: You have blood in your stool 2-3 days after the procedure. Get help right away if: You have more than a small spotting of blood in your stool. You pass large blood clots in your stool. Your abdomen is swollen. You have nausea or vomiting. You have a fever. You have increasing abdominal pain that is not relieved with medicine. Summary After the procedure, it is common to have a small amount of blood in your stool. You may also have mild abdominal cramping and bloating. For the first 24 hours after the procedure, do not drive or use machinery, sign important documents, or drink alcohol. Contact your health  care provider if you have a lot of blood in your stool, nausea or vomiting, a fever, or increased abdominal pain. This information is not intended to replace advice given to you by your health care provider. Make sure you discuss any questions you have with your health care provider. Document Released: 06/04/2004 Document Revised: 08/13/2017 Document Reviewed: 01/02/2016 Elsevier Interactive Patient Education  2019 [employer].

## 2018-11-12 NOTE — H&P (Addendum)
Ernest Barnett is an 63 y.o. male.   Chief Complaint: Patient is here for EGD ED and colonoscopy. HPI: Patient 63 year old Caucasian male who presents with few months history of dysphagia to solids primarily with meats.  He feels it gets lodged in lower sternal area.  He has had few episodes where he had to regurgitate food for relief.  He states swallowing has improved since he has cut back on cigarette smoking.  He is down to smoking 3 to 4 cigarettes/day.  He denies frequent heartburn.  He denies abdominal pain melena or rectal bleeding.  History of colonic adenomas.  Last exam was in [facility] by Dr. Sunday Shams in September 2016.  He had a large and a small adenoma removed. Last aspirin dose was 1 week ago.  Family history is negative for CRC.  Past Medical History:  Diagnosis Date  . COPD (chronic obstructive pulmonary disease) ([place])   . CVA (cerebral vascular accident) ([place]) 2013  . HOH (hard of hearing)   . Hypercholesteremia   . Hypertension   . Stroke ([place]) 03/24/2012   left sided weakness  . Vitamin D deficiency     Past Surgical History:  Procedure Laterality Date  . CATARACT EXTRACTION     right eye  . CATARACT EXTRACTION W/PHACO  10/08/2012   Procedure: CATARACT EXTRACTION PHACO AND INTRAOCULAR LENS PLACEMENT ([facility]);  Surgeon: Tonny Branch, MD;  Location: [facility];  Service: Ophthalmology;  Laterality: Left;  CDE:6.64  . CHOLECYSTECTOMY     [facility]  . ELBOW FRACTURE SURGERY     left  . HERNIA REPAIR     right inguinal  . HYDROCELE EXCISION / REPAIR    . RETINAL DETACHMENT SURGERY Left 2019  . TONSILLECTOMY    . VOCAL CORD INJECTION     removal of polyp-2005    History reviewed. No pertinent family history. Social History:  reports that he has been smoking cigarettes. He has a 60.00 pack-year smoking history. He has never used smokeless tobacco. He reports current alcohol use. He reports that he does not use drugs.  Allergies:  Allergies  Allergen Reactions  . Pravastatin     Knee pain    Medications Prior to Admission  Medication Sig Dispense Refill  . aspirin EC 81 MG tablet Take 81 mg by mouth daily.    . Omega-3 Fatty Acids (FISH OIL) 1000 MG CAPS Take 3,000 mg by mouth daily.     . polyethylene glycol (MIRALAX / GLYCOLAX) packet Take 17 g by mouth daily. Patient states that he takes as needed. (Patient taking differently: Take 17 g by mouth daily as needed for mild constipation. ) 30 each 5  . sodium bicarbonate 650 MG tablet Take 650 mg by mouth 2 (two) times daily.    . tamsulosin (FLOMAX) 0.4 MG CAPS capsule Take 0.4 mg by mouth daily.       No results found for this or any previous visit (from the past 48 hour(s)). No results found.  ROS  Blood pressure 104/63, pulse 92, temperature 98.3 F (36.8 C), temperature source Oral, resp. rate 17, height 5\' 7"  (1.702 m), weight 101.2 kg, SpO2 97 %. Physical Exam  Constitutional: He appears well-developed and well-nourished.  HENT:  Mouth/Throat: Oropharynx is clear and moist.  Eyes: Conjunctivae are normal. No scleral icterus.  Neck: No thyromegaly present.  Cardiovascular: Normal rate, regular rhythm and normal heart sounds.  No murmur heard. Respiratory: Effort normal and breath sounds normal.  GI:  Abdomen is  full.  He has right inguinal scar.  Small umbilical hernia noted.  Abdomen is soft and nontender with organomegaly or masses.  Musculoskeletal:        General: No edema.  Lymphadenopathy:    He has no cervical adenopathy.  Neurological: He is alert.  Skin: Skin is warm and dry.     Assessment/Plan Esophageal dysphagia. History of colonic adenomas EGD with ED and surveillance colonoscopy.  Hildred Laser, MD 11/12/2018, 10:24 AM

## 2018-11-12 NOTE — Op Note (Signed)
[facility] Patient Name: Ernest Barnett Procedure Date: 11/12/2018 10:13 AM MRN: [id number] Date of Birth: Mar 08, 1956 Attending MD: Hildred Laser , MD CSN: [id number] Age: 63 Admit Type: Outpatient Procedure:                Upper GI endoscopy Indications:              Esophageal dysphagia Providers:                Hildred Laser, MD, Janeece Riggers, RN, Aram Candela Referring MD:             Stoney Bang, MD Medicines:                Lidocaine spray, Meperidine 25 mg IV, Midazolam 6                            mg IV Complications:            No immediate complications. Estimated Blood Loss:     Estimated blood loss was minimal. Procedure:                Pre-Anesthesia Assessment:                           - Prior to the procedure, a History and Physical                            was performed, and patient medications and                            allergies were reviewed. The patient's tolerance of                            previous anesthesia was also reviewed. The risks                            and benefits of the procedure and the sedation                            options and risks were discussed with the patient.                            All questions were answered, and informed consent                            was obtained. Prior Anticoagulants: The patient                            last took aspirin 7 days prior to the procedure.                            ASA Grade Assessment: III - A patient with severe                            systemic disease. After reviewing the risks and  benefits, the patient was deemed in satisfactory                            condition to undergo the procedure.                           After obtaining informed consent, the endoscope was                            passed under direct vision. Throughout the                            procedure, the patient's blood pressure, pulse, and   oxygen saturations were monitored continuously. The                            GIF-H190 ([id number]) scope was introduced through the                            mouth, and advanced to the second part of duodenum.                            The upper GI endoscopy was accomplished without                            difficulty. The patient tolerated the procedure                            well. Scope In: 10:36:56 AM Scope Out: 10:44:40 AM Total Procedure Duration: 0 hours 7 minutes 44 seconds  Findings:      The proximal esophagus and mid esophagus were normal.      LA Grade C (one or more mucosal breaks continuous between tops of 2 or       more mucosal folds, less than 75% circumference) esophagitis with no       bleeding was found 34 to 37 cm from the incisors.      One benign-appearing, intrinsic moderate stenosis was found 37 cm from       the incisors(GEJ). This stenosis measured less than one cm (in length).       The stenosis was traversed. A TTS dilator was passed through the scope.       Dilation with a 15-16.5-18 mm balloon dilator was performed to 15 mm and       16.5 mm. The dilation site was examined and showed mild mucosal       disruption, moderate improvement in luminal narrowing and no perforation.      A 3 cm hiatal hernia was present.      The entire examined stomach was normal.      Patchy mildly erythematous mucosa without active bleeding and with no       stigmata of bleeding was found in the duodenal bulb.      The second portion of the duodenum and major papilla were normal. Impression:               - Normal proximal esophagus and mid esophagus.                           -  LA Grade C reflux esophagitis.                           - Benign-appearing esophageal stenosis at GEJ.                            Dilated.                           - 3 cm hiatal hernia.                           - Normal stomach.                           - Erythematous duodenopathy.                            - Normal second portion of the duodenum and major                            papilla.                           - No specimens collected. Moderate Sedation:      Moderate (conscious) sedation was administered by the endoscopy nurse       and supervised by the endoscopist. The following parameters were       monitored: oxygen saturation, heart rate, blood pressure, CO2       capnography and response to care. Total physician intraservice time was       14 minutes. Recommendation:           - Patient has a contact number available for                            emergencies. The signs and symptoms of potential                            delayed complications were discussed with the                            patient. Return to normal activities tomorrow.                            Written discharge instructions were provided to the                            patient.                           - Resume previous diet today.                           - Continue present medications.                           - No aspirin, ibuprofen, naproxen, or other  non-steroidal anti-inflammatory drugs for 1 day.                           - Use Protonix (pantoprazole) 40 mg PO BID today.                           - Return to GI clinic in 2 months. Procedure Code(s):        --- Professional ---                           [phone], Esophagogastroduodenoscopy, flexible,                            transoral; with transendoscopic balloon dilation of                            esophagus (less than 30 mm diameter)                           G0500, Moderate sedation services provided by the                            same physician or other qualified health care                            professional performing a gastrointestinal                            endoscopic service that sedation supports,                            requiring the presence of an independent trained                             observer to assist in the monitoring of the                            patient's level of consciousness and physiological                            status; initial 15 minutes of intra-service time;                            patient age 62 years or older (additional time may                            be reported with [phone], as appropriate) Diagnosis Code(s):        --- Professional ---                           K21.0, Gastro-esophageal reflux disease with                            esophagitis  K22.2, Esophageal obstruction                           K44.9, Diaphragmatic hernia without obstruction or                            gangrene                           K31.89, Other diseases of stomach and duodenum                           R13.14, Dysphagia, pharyngoesophageal phase CPT copyright 2018 [employer]. All rights reserved. The codes documented in this report are preliminary and upon coder review may  be revised to meet current compliance requirements. Hildred Laser, MD Hildred Laser, MD 11/12/2018 11:38:07 AM This report has been signed electronically. Number of Addenda: 0

## 2018-11-16 ENCOUNTER — Encounter (HOSPITAL_COMMUNITY): Payer: Self-pay | Admitting: Internal Medicine

## 2019-01-26 ENCOUNTER — Ambulatory Visit (INDEPENDENT_AMBULATORY_CARE_PROVIDER_SITE_OTHER): Payer: Medicare Other | Admitting: Internal Medicine

## 2019-03-23 ENCOUNTER — Ambulatory Visit (INDEPENDENT_AMBULATORY_CARE_PROVIDER_SITE_OTHER): Payer: Medicare Other | Admitting: Internal Medicine

## 2019-04-07 DIAGNOSIS — Z0000 Encounter for general adult medical examination without abnormal findings: Secondary | ICD-10-CM | POA: Diagnosis not present

## 2019-04-07 DIAGNOSIS — E785 Hyperlipidemia, unspecified: Secondary | ICD-10-CM | POA: Diagnosis not present

## 2019-04-07 DIAGNOSIS — M10072 Idiopathic gout, left ankle and foot: Secondary | ICD-10-CM | POA: Diagnosis not present

## 2019-04-07 DIAGNOSIS — I10 Essential (primary) hypertension: Secondary | ICD-10-CM | POA: Diagnosis not present

## 2019-04-07 DIAGNOSIS — N181 Chronic kidney disease, stage 1: Secondary | ICD-10-CM | POA: Diagnosis not present

## 2019-04-08 ENCOUNTER — Other Ambulatory Visit (INDEPENDENT_AMBULATORY_CARE_PROVIDER_SITE_OTHER): Payer: Self-pay | Admitting: Internal Medicine

## 2019-09-09 DIAGNOSIS — N181 Chronic kidney disease, stage 1: Secondary | ICD-10-CM | POA: Diagnosis not present

## 2019-09-09 DIAGNOSIS — E785 Hyperlipidemia, unspecified: Secondary | ICD-10-CM | POA: Diagnosis not present

## 2019-09-09 DIAGNOSIS — M10072 Idiopathic gout, left ankle and foot: Secondary | ICD-10-CM | POA: Diagnosis not present

## 2019-09-09 DIAGNOSIS — M25462 Effusion, left knee: Secondary | ICD-10-CM | POA: Diagnosis not present

## 2019-09-09 DIAGNOSIS — I10 Essential (primary) hypertension: Secondary | ICD-10-CM | POA: Diagnosis not present

## 2019-10-30 DIAGNOSIS — Z961 Presence of intraocular lens: Secondary | ICD-10-CM | POA: Diagnosis not present

## 2019-10-30 DIAGNOSIS — Z8669 Personal history of other diseases of the nervous system and sense organs: Secondary | ICD-10-CM | POA: Diagnosis not present

## 2019-10-30 DIAGNOSIS — H524 Presbyopia: Secondary | ICD-10-CM | POA: Diagnosis not present

## 2019-10-30 DIAGNOSIS — H4301 Vitreous prolapse, right eye: Secondary | ICD-10-CM | POA: Diagnosis not present

## 2019-12-07 DIAGNOSIS — N181 Chronic kidney disease, stage 1: Secondary | ICD-10-CM | POA: Diagnosis not present

## 2019-12-07 DIAGNOSIS — M25462 Effusion, left knee: Secondary | ICD-10-CM | POA: Diagnosis not present

## 2019-12-07 DIAGNOSIS — J0191 Acute recurrent sinusitis, unspecified: Secondary | ICD-10-CM | POA: Diagnosis not present

## 2019-12-07 DIAGNOSIS — I10 Essential (primary) hypertension: Secondary | ICD-10-CM | POA: Diagnosis not present

## 2019-12-07 DIAGNOSIS — E785 Hyperlipidemia, unspecified: Secondary | ICD-10-CM | POA: Diagnosis not present

## 2020-01-03 DIAGNOSIS — R0781 Pleurodynia: Secondary | ICD-10-CM | POA: Diagnosis not present

## 2020-01-04 DIAGNOSIS — R0781 Pleurodynia: Secondary | ICD-10-CM | POA: Diagnosis not present

## 2020-04-25 ENCOUNTER — Ambulatory Visit (INDEPENDENT_AMBULATORY_CARE_PROVIDER_SITE_OTHER): Payer: Medicare Other | Admitting: Internal Medicine

## 2020-04-25 ENCOUNTER — Other Ambulatory Visit: Payer: Self-pay

## 2020-04-25 ENCOUNTER — Encounter (INDEPENDENT_AMBULATORY_CARE_PROVIDER_SITE_OTHER): Payer: Self-pay | Admitting: Internal Medicine

## 2020-04-25 VITALS — BP 125/83 | HR 82 | Temp 98.4°F | Resp 22 | Ht 67.0 in | Wt 222.3 lb

## 2020-04-25 DIAGNOSIS — R1310 Dysphagia, unspecified: Secondary | ICD-10-CM

## 2020-04-25 DIAGNOSIS — K2100 Gastro-esophageal reflux disease with esophagitis, without bleeding: Secondary | ICD-10-CM

## 2020-04-25 DIAGNOSIS — K5909 Other constipation: Secondary | ICD-10-CM | POA: Insufficient documentation

## 2020-04-25 DIAGNOSIS — R1319 Other dysphagia: Secondary | ICD-10-CM

## 2020-04-25 DIAGNOSIS — K219 Gastro-esophageal reflux disease without esophagitis: Secondary | ICD-10-CM | POA: Insufficient documentation

## 2020-04-25 MED ORDER — PANTOPRAZOLE SODIUM 40 MG PO TBEC: DELAYED_RELEASE_TABLET | ORAL | 5 refills | Status: DC

## 2020-04-25 NOTE — Patient Instructions (Addendum)
Please schedule your dentures lying as soon as possible. Remember to eat slowly and chew your food thoroughly. Take pantoprazole 40 mg by mouth 30 minutes before breakfast and evening meal daily. Take polyethylene glycol daily or every other day.  You can titrate the dose.  Goal is for you to have at least 3 bowel movements per week. Esophagogastroduodenoscopy with esophageal dilation to be scheduled.

## 2020-04-25 NOTE — Progress Notes (Signed)
Presenting complaint;  Dysphagia.  Database and subjective:  Patient is 64 year old Caucasian male who has history of ulcerative reflux esophagitis complicated by distal esophageal stricture which was last dilated in January 2020 with a balloon to 16.5 mm.  Patient was advised to continue PPI double dose.  He also has history of colonic adenomas.  He had 3 small polyps removed on his last colonoscopy of January 2020 and 2 of these were adenomas.  He was advised follow-up exam in January 2025.  He is here for scheduled visit accompanied by his daughter [name]. He has been experiencing dysphagia for the last 6 months.  He has difficulty with meats and steak.  He points to upper sternal area site of bolus obstruction.  He does not take PPI anymore.  He also missed couple of appointments last year.  He states he was able to swallow much better and was not having any heartburn therefore he decided to stop pantoprazole.  He says he does not have heartburn often.  He continues to complain of constipation.  He may have 1-2 bowel movements per week.  He denies melena or rectal bleeding.  His appetite is good and his weight has been stable.  Weight record reveals that he has gained 4 pounds since his last visit of May 2019. Patient states he is a fast eater and is only using upper dentures as lower dentures do not fit well.  Current Medications: Outpatient Encounter Medications as of 04/25/2020  Medication Sig  . aspirin EC 81 MG tablet Take 1 tablet (81 mg total) by mouth daily.  . Omega-3 Fatty Acids (FISH OIL) 1000 MG CAPS Take 3,000 mg by mouth daily.   . polyethylene glycol (MIRALAX / GLYCOLAX) packet Take 17 g by mouth daily. Patient states that he takes as needed. (Patient taking differently: Take 17 g by mouth daily as needed for mild constipation. )  . sodium bicarbonate 650 MG tablet Take 650 mg by mouth 2 (two) times daily.  . tamsulosin (FLOMAX) 0.4 MG CAPS capsule Take 0.4 mg by mouth daily.    . pantoprazole (PROTONIX) 40 MG tablet TAKE 1 TABLET BY MOUTH TWICE DAILY BEFORE A  MEAL (Patient not taking: Reported on 04/25/2020)   No facility-administered encounter medications on file as of 04/25/2020.     Objective: Blood pressure 125/83, pulse 82, temperature 98.4 F (36.9 C), temperature source Oral, resp. rate (!) 22, height 5\' 7"  (1.702 m), weight 222 lb 4.8 oz (100.8 kg). Patient is alert and in no acute distress. He he is edentulous and lower jaw and has upper dental plate. Conjunctiva is pink. Sclera is nonicteric Oropharyngeal mucosa is normal. No neck masses or thyromegaly noted. Cardiac exam with regular rhythm normal S1 and S2. No murmur or gallop noted. Lungs are clear to auscultation. Abdomen is full.  He has small umbilical hernia.  Hernia is completely reducible.  Abdomen is soft and nontender with organomegaly or masses. No LE edema or clubbing noted.   Assessment:  #1.  Esophageal dysphagia.  He has well-documented history of high-grade distal esophageal stricture secondary to reflux esophagitis.  He had grade C esophagitis on his last EGD 18 months ago.  He needs to be back on acid suppression.  #2.  Chronic constipation.  He needs to be taking polyethylene glycol on schedule rather than on as needed basis.  Goal is for him to have at least 3 bowel movements a week.  #3.  History of colonic adenomas.  Next colonoscopy  in January 2025.   Plan:  Patient advised to get his dentures fixed so that he can chew food thoroughly before swallowing. Begin pantoprazole 40 mg by mouth 30 minutes before breakfast and evening meal daily. Patient advised to take polyethylene glycol daily or every other day and he can titrate the dose. Esophagogastroduodenoscopy with esophageal dilation to be scheduled in near future. Office visit in 3 months.

## 2020-04-26 ENCOUNTER — Other Ambulatory Visit (INDEPENDENT_AMBULATORY_CARE_PROVIDER_SITE_OTHER): Payer: Self-pay | Admitting: *Deleted

## 2020-04-26 DIAGNOSIS — R1319 Other dysphagia: Secondary | ICD-10-CM

## 2020-04-27 ENCOUNTER — Other Ambulatory Visit (INDEPENDENT_AMBULATORY_CARE_PROVIDER_SITE_OTHER): Payer: Self-pay | Admitting: *Deleted

## 2020-05-01 ENCOUNTER — Other Ambulatory Visit (INDEPENDENT_AMBULATORY_CARE_PROVIDER_SITE_OTHER): Payer: Self-pay | Admitting: *Deleted

## 2020-05-09 ENCOUNTER — Other Ambulatory Visit (HOSPITAL_COMMUNITY)
Admission: RE | Admit: 2020-05-09 | Discharge: 2020-05-09 | Disposition: A | Discharge status: Home / Self Care | Payer: Medicare Other | Source: Ambulatory Visit | Attending: Internal Medicine | Admitting: Internal Medicine

## 2020-05-09 ENCOUNTER — Other Ambulatory Visit: Payer: Self-pay

## 2020-05-09 DIAGNOSIS — Z01812 Encounter for preprocedural laboratory examination: Secondary | ICD-10-CM | POA: Diagnosis not present

## 2020-05-09 DIAGNOSIS — Z20822 Contact with and (suspected) exposure to covid-19: Secondary | ICD-10-CM | POA: Diagnosis not present

## 2020-05-09 LAB — SARS CORONAVIRUS 2 (TAT 6-24 HRS): SARS Coronavirus 2: NEGATIVE

## 2020-05-10 ENCOUNTER — Encounter (HOSPITAL_COMMUNITY): Payer: Self-pay | Admitting: Internal Medicine

## 2020-05-10 ENCOUNTER — Other Ambulatory Visit: Payer: Self-pay

## 2020-05-10 ENCOUNTER — Ambulatory Visit (INDEPENDENT_AMBULATORY_CARE_PROVIDER_SITE_OTHER): Payer: Medicare Other | Admitting: Gastroenterology

## 2020-05-10 ENCOUNTER — Ambulatory Visit (HOSPITAL_COMMUNITY)
Admission: RE | Admit: 2020-05-10 | Discharge: 2020-05-10 | Disposition: A | Discharge status: Home / Self Care | Payer: Medicare Other | Attending: Internal Medicine | Admitting: Internal Medicine

## 2020-05-10 ENCOUNTER — Encounter (HOSPITAL_COMMUNITY)
Admission: RE | Disposition: A | Discharge status: Home / Self Care | Payer: Self-pay | Source: Home / Self Care | Attending: Internal Medicine

## 2020-05-10 DIAGNOSIS — K2270 Barrett's esophagus without dysplasia: Secondary | ICD-10-CM | POA: Insufficient documentation

## 2020-05-10 DIAGNOSIS — F17210 Nicotine dependence, cigarettes, uncomplicated: Secondary | ICD-10-CM | POA: Insufficient documentation

## 2020-05-10 DIAGNOSIS — J449 Chronic obstructive pulmonary disease, unspecified: Secondary | ICD-10-CM | POA: Insufficient documentation

## 2020-05-10 DIAGNOSIS — I10 Essential (primary) hypertension: Secondary | ICD-10-CM | POA: Diagnosis not present

## 2020-05-10 DIAGNOSIS — Z20822 Contact with and (suspected) exposure to covid-19: Secondary | ICD-10-CM | POA: Diagnosis not present

## 2020-05-10 DIAGNOSIS — K222 Esophageal obstruction: Secondary | ICD-10-CM

## 2020-05-10 DIAGNOSIS — Z8673 Personal history of transient ischemic attack (TIA), and cerebral infarction without residual deficits: Secondary | ICD-10-CM | POA: Diagnosis not present

## 2020-05-10 DIAGNOSIS — Z79899 Other long term (current) drug therapy: Secondary | ICD-10-CM | POA: Insufficient documentation

## 2020-05-10 DIAGNOSIS — R1314 Dysphagia, pharyngoesophageal phase: Secondary | ICD-10-CM

## 2020-05-10 DIAGNOSIS — K449 Diaphragmatic hernia without obstruction or gangrene: Secondary | ICD-10-CM | POA: Insufficient documentation

## 2020-05-10 DIAGNOSIS — Z7982 Long term (current) use of aspirin: Secondary | ICD-10-CM | POA: Insufficient documentation

## 2020-05-10 DIAGNOSIS — K228 Other specified diseases of esophagus: Secondary | ICD-10-CM | POA: Insufficient documentation

## 2020-05-10 DIAGNOSIS — E7800 Pure hypercholesterolemia, unspecified: Secondary | ICD-10-CM | POA: Diagnosis not present

## 2020-05-10 DIAGNOSIS — K209 Esophagitis, unspecified without bleeding: Secondary | ICD-10-CM

## 2020-05-10 DIAGNOSIS — R1319 Other dysphagia: Secondary | ICD-10-CM

## 2020-05-10 HISTORY — PX: ESOPHAGOGASTRODUODENOSCOPY: SHX5428

## 2020-05-10 HISTORY — PX: BIOPSY: SHX5522

## 2020-05-10 HISTORY — PX: ESOPHAGEAL DILATION: SHX303

## 2020-05-10 SURGERY — EGD (ESOPHAGOGASTRODUODENOSCOPY)
Anesthesia: Moderate Sedation

## 2020-05-10 MED ORDER — LIDOCAINE VISCOUS HCL 2 % MT SOLN
OROMUCOSAL | Status: DC | PRN
  Administered 2020-05-10: 4 mL via OROMUCOSAL

## 2020-05-10 MED ORDER — MEPERIDINE HCL 50 MG/ML IJ SOLN
INTRAMUSCULAR | Status: AC
  Filled 2020-05-10: qty 1

## 2020-05-10 MED ORDER — MEPERIDINE HCL 50 MG/ML IJ SOLN
INTRAMUSCULAR | Status: DC | PRN
  Administered 2020-05-10 (×2): 25 mg via INTRAVENOUS

## 2020-05-10 MED ORDER — MIDAZOLAM HCL 5 MG/5ML IJ SOLN
INTRAMUSCULAR | Status: DC | PRN
  Administered 2020-05-10: 1 mg via INTRAVENOUS
  Administered 2020-05-10 (×2): 2 mg via INTRAVENOUS

## 2020-05-10 MED ORDER — SODIUM CHLORIDE 0.9 % IV SOLN: INTRAVENOUS | Status: DC

## 2020-05-10 MED ORDER — LIDOCAINE VISCOUS HCL 2 % MT SOLN
OROMUCOSAL | Status: AC
  Filled 2020-05-10: qty 15

## 2020-05-10 MED ORDER — STERILE WATER FOR IRRIGATION IR SOLN
Status: DC | PRN
  Administered 2020-05-10: 1.5 mL

## 2020-05-10 MED ORDER — MIDAZOLAM HCL 5 MG/5ML IJ SOLN
INTRAMUSCULAR | Status: AC
  Filled 2020-05-10: qty 10

## 2020-05-10 NOTE — Op Note (Signed)
[facility] Patient Name: Ernest Barnett Procedure Date: 05/10/2020 2:13 PM MRN: [id number] Date of Birth: 1956/03/26 Attending MD: Hildred Laser , MD CSN: [id number] Age: 64 Admit Type: Outpatient Procedure:                Upper GI endoscopy Indications:              Esophageal dysphagia, Stricture of the esophagus,                            Follow-up of esophageal stricture, For therapy of                            esophageal stricture Providers:                Hildred Laser, MD, Otis Peak B. Sharon Seller, RN, Aram Candela Referring MD:             Stoney Bang MD, MD Medicines:                Lidocaine spray, Meperidine 50 mg IV, Midazolam 5                            mg IV Complications:            No immediate complications. Estimated Blood Loss:     Estimated blood loss was minimal. Procedure:                Pre-Anesthesia Assessment:                           - Prior to the procedure, a History and Physical                            was performed, and patient medications and                            allergies were reviewed. The patient's tolerance of                            previous anesthesia was also reviewed. The risks                            and benefits of the procedure and the sedation                            options and risks were discussed with the patient.                            All questions were answered, and informed consent                            was obtained. Prior Anticoagulants: The patient has  taken no previous anticoagulant or antiplatelet                            agents except for aspirin. ASA Grade Assessment: II                            - A patient with mild systemic disease. After                            reviewing the risks and benefits, the patient was                            deemed in satisfactory condition to undergo the                            procedure.                            After obtaining informed consent, the endoscope was                            passed under direct vision. Throughout the                            procedure, the patient's blood pressure, pulse, and                            oxygen saturations were monitored continuously. The                            GIF-H190 ([id number]) scope was introduced through the                            mouth, and advanced to the second part of duodenum.                            The upper GI endoscopy was accomplished without                            difficulty. The patient tolerated the procedure                            well. Scope In: 2:42:23 PM Scope Out: 2:51:15 PM Total Procedure Duration: 0 hours 8 minutes 52 seconds  Findings:      The hypopharynx was normal.      LA Grade A (one or more mucosal breaks less than 5 mm, not extending       between tops of 2 mucosal folds) esophagitis with no bleeding was found       36 to 37 cm from the incisors.      There were esophageal mucosal changes consistent with short-segment       Barrett's esophagus present in the distal esophagus. The maximum       longitudinal extent of these mucosal changes was [place] of Barrett's  Segment]15 mm in length. Mucosa was biopsied with a cold forceps for       histology. One specimen bottle was sent to pathology. The pathology       specimen was placed into Bottle Number 1.      One benign-appearing, intrinsic mild stenosis was found 37 cm from the       incisors. The stenosis was traversed. A TTS dilator was passed through       the scope. Dilation with a 15-16.5-18 mm balloon dilator was performed       to 15 mm, 16.5 mm and 18 mm. The dilation site was examined and showed       moderate mucosal disruption, moderate improvement in luminal narrowing       and no perforation.      A 3 cm hiatal hernia was present.      The entire examined stomach was normal.      The duodenal bulb and second portion  of the duodenum were normal. Impression:               - Normal hypopharynx.                           - LA Grade A esophagitis with no bleeding.                           - Esophageal mucosal changes consistent with                            short-segment Barrett's esophagus. Biopsied.                           - Benign-appearing esophageal stenosis. Dilated.                           - 3 cm hiatal hernia.                           - Normal stomach.                           - Normal duodenal bulb and second portion of the                            duodenum.                           comment; esophageal ulcers have healed and now he                            has developed short segment Barretts. Moderate Sedation:      Moderate (conscious) sedation was administered by the endoscopy nurse       and supervised by the endoscopist. The following parameters were       monitored: oxygen saturation, heart rate, blood pressure, CO2       capnography and response to care. Total physician intraservice time was       12 minutes. Recommendation:           - Patient has a contact number available for  emergencies. The signs and symptoms of potential                            delayed complications were discussed with the                            patient. Return to normal activities tomorrow.                            Written discharge instructions were provided to the                            patient.                           - Mechanical soft diet today.                           - Continue present medications.                           - No aspirin, ibuprofen, naproxen, or other                            non-steroidal anti-inflammatory drugs for 3 days.                           - Await pathology results.                           - Return to GI clinic in 3 months. Procedure Code(s):        --- Professional ---                           [phone],  Esophagogastroduodenoscopy, flexible,                            transoral; with transendoscopic balloon dilation of                            esophagus (less than 30 mm diameter)                           G0500, Moderate sedation services provided by the                            same physician or other qualified health care                            professional performing a gastrointestinal                            endoscopic service that sedation supports,                            requiring the presence of an independent trained  observer to assist in the monitoring of the                            patient's level of consciousness and physiological                            status; initial 15 minutes of intra-service time;                            patient age 11 years or older (additional time may                            be reported with [phone], as appropriate) Diagnosis Code(s):        --- Professional ---                           K22.8, Other specified diseases of esophagus                           K20.90, Esophagitis, unspecified without bleeding                           K22.2, Esophageal obstruction                           K44.9, Diaphragmatic hernia without obstruction or                            gangrene                           R13.14, Dysphagia, pharyngoesophageal phase CPT copyright 2019 American Medical Association. All rights reserved. The codes documented in this report are preliminary and upon coder review may  be revised to meet current compliance requirements. Hildred Laser, MD Hildred Laser, MD 05/10/2020 3:05:26 PM This report has been signed electronically. Number of Addenda: 0

## 2020-05-10 NOTE — H&P (Addendum)
Ernest Barnett is an 64 y.o. male.   Chief Complaint: Patient is here for esophagogastroduodenoscopy with esophageal dilation. HPI: Patient is 64 year old Caucasian male who has history of erosive/ulcerative reflux esophagitis complicated by high-grade stricture which was dilated 18 months ago to 16.5 mm.  Patient was seen in the office recently complaining of solid food dysphagia.  He was not taking his PPI.  He was advised to go back on PPI.  He says since his office visit he only had one episode of dysphagia.  He denies heartburn throat symptoms abdominal pain melena anorexia or weight loss. Last aspirin dose was yesterday.  Past Medical History:  Diagnosis Date  . COPD (chronic obstructive pulmonary disease) ([place])   . CVA (cerebral vascular accident) ([place]) 2013  . HOH (hard of hearing)   . Hypercholesteremia   . Hypertension   . Stroke ([place]) 03/24/2012   left sided weakness  . Vitamin D deficiency         GERD complicated by esophageal stricture.  Past Surgical History:  Procedure Laterality Date  . BIOPSY  11/12/2018   Procedure: BIOPSY;  Surgeon: Rogene Houston, MD;  [facility];  Service: Endoscopy;;  colon  . CATARACT EXTRACTION     right eye  . CATARACT EXTRACTION W/PHACO  10/08/2012   Procedure: CATARACT EXTRACTION PHACO AND INTRAOCULAR LENS PLACEMENT (IOC);  Surgeon: Tonny Branch, MD;  Location: [facility];  Service: Ophthalmology;  Laterality: Left;  CDE:6.64  . CHOLECYSTECTOMY     MMH  . COLONOSCOPY N/A 11/12/2018   Procedure: COLONOSCOPY;  Surgeon: Rogene Houston, MD;  [facility];  Service: Endoscopy;  Laterality: N/A;  1030  . ELBOW FRACTURE SURGERY     left  . ESOPHAGEAL DILATION N/A 11/12/2018   Procedure: ESOPHAGEAL DILATION;  Surgeon: Rogene Houston, MD;  [facility];  Service: Endoscopy;  Laterality: N/A;  . ESOPHAGOGASTRODUODENOSCOPY N/A 11/12/2018   Procedure: ESOPHAGOGASTRODUODENOSCOPY (EGD);  Surgeon: Rogene Houston, MD;   [facility];  Service: Endoscopy;  Laterality: N/A;  . HERNIA REPAIR     right inguinal  . HYDROCELE EXCISION / REPAIR    . POLYPECTOMY  11/12/2018   Procedure: POLYPECTOMY;  Surgeon: Rogene Houston, MD;  [facility];  Service: Endoscopy;;  colon   . RETINAL DETACHMENT SURGERY Left 2019  . TONSILLECTOMY    . VOCAL CORD INJECTION     removal of polyp-2005    History reviewed. No pertinent family history. Social History:  reports that he has been smoking cigarettes. He has a 60.00 pack-year smoking history. He has never used smokeless tobacco. He reports current alcohol use. He reports that he does not use drugs.  Allergies: No Known Allergies  Medications Prior to Admission  Medication Sig Dispense Refill  . aspirin EC 81 MG tablet Take 1 tablet (81 mg total) by mouth daily.    . Omega-3 Fatty Acids (FISH OIL) 1000 MG CAPS Take 1,000 mg by mouth in the morning, at noon, and at bedtime.     . pantoprazole (PROTONIX) 40 MG tablet TAKE 1 TABLET BY MOUTH TWICE DAILY BEFORE A  MEAL (Patient taking differently: Take 40 mg by mouth 2 (two) times daily. ) 60 tablet 5  . polyethylene glycol (MIRALAX / GLYCOLAX) packet Take 17 g by mouth daily. Patient states that he takes as needed. (Patient taking differently: Take 17 g by mouth daily as needed for mild constipation. ) 30 each 5  .  pravastatin (PRAVACHOL) 40 MG tablet Take 40 mg by mouth daily.    . sodium bicarbonate 650 MG tablet Take 650 mg by mouth 2 (two) times daily.    . tamsulosin (FLOMAX) 0.4 MG CAPS capsule Take 0.4 mg by mouth daily.       Results for orders placed or performed during the hospital encounter of 05/09/20 (from the past 48 hour(s))  SARS CORONAVIRUS 2 (TAT 6-24 HRS) Nasopharyngeal Nasopharyngeal Swab     Status: None   Collection Time: 05/09/20  8:30 AM   Specimen: Nasopharyngeal Swab  Result Value Ref Range   SARS Coronavirus 2 NEGATIVE NEGATIVE    Comment: (NOTE) SARS-CoV-2 target nucleic  acids are NOT DETECTED.  The SARS-CoV-2 RNA is generally detectable in upper and lower respiratory specimens during the acute phase of infection. Negative results do not preclude SARS-CoV-2 infection, do not rule out co-infections with other pathogens, and should not be used as the sole basis for treatment or other patient management decisions. Negative results must be combined with clinical observations, patient history, and epidemiological information. The expected result is Negative.  Fact Sheet for Patients: SugarRoll.be  Fact Sheet for Healthcare Providers: https://www.woods-mathews.com/  This test is not yet approved or cleared by the [place] FDA and  has been authorized for detection and/or diagnosis of SARS-CoV-2 by FDA under an Emergency Use Authorization (EUA). This EUA will remain  in effect (meaning this test can be used) for the duration of the COVID-19 declaration under Se ction 564(b)(1) of the Act, 21 U.S.C. section 360bbb-3(b)(1), unless the authorization is terminated or revoked sooner.  Performed at [facility], [place] [address] [place], [place] [address]    No results found.  Review of Systems  Blood pressure (!) 137/96, pulse 75, temperature 98.2 F (36.8 C), temperature source Oral, resp. rate (!) 26, height 5\' 7"  (1.702 m), SpO2 98 %. Physical Exam HENT:     Mouth/Throat:     Mouth: Mucous membranes are moist.     Pharynx: Oropharynx is clear.  Eyes:     General: No scleral icterus.    Conjunctiva/sclera: Conjunctivae normal.  Cardiovascular:     Rate and Rhythm: Normal rate and regular rhythm.     Heart sounds: Normal heart sounds. No murmur heard.   Pulmonary:     Effort: Pulmonary effort is normal.     Breath sounds: Normal breath sounds.  Abdominal:     Comments: Abdomen is full but soft and nontender with organomegaly or masses.  Musculoskeletal:        General: No swelling.      Cervical back: Neck supple.  Lymphadenopathy:     Cervical: No cervical adenopathy.  Skin:    General: Skin is warm and dry.  Neurological:     Mental Status: He is alert.  Psychiatric:        Mood and Affect: Mood normal.      Assessment/Plan Esophageal dysphagia secondary to known esophageal stricture secondary to GERD. Esophagogastroduodenoscopy with esophageal dilation.  Hildred Laser, MD 05/10/2020, 2:32 PM

## 2020-05-10 NOTE — Discharge Instructions (Signed)
No aspirin or NSAIDs for 3 days. Resume other medications as before. Mechanical soft diet today and usual diet starting tomorrow. No driving for 24 hours. Physician will call with biopsy results. Office visit in 4 months.      Upper Endoscopy, Adult, Care After This sheet gives you information about how to care for yourself after your procedure. Your health care provider may also give you more specific instructions. If you have problems or questions, contact your health care provider. What can I expect after the procedure? After the procedure, it is common to have:  A sore throat.  Mild stomach pain or discomfort.  Bloating.  Nausea. Follow these instructions at home:   Follow instructions from your health care provider about what to eat or drink after your procedure.  Return to your normal activities as told by your health care provider. Ask your health care provider what activities are safe for you.  Take over-the-counter and prescription medicines only as told by your health care provider.  Do not drive for 24 hours if you were given a sedative during your procedure.  Keep all follow-up visits as told by your health care provider. This is important. Contact a health care provider if you have:  A sore throat that lasts longer than one day.  Trouble swallowing. Get help right away if:  You vomit blood or your vomit looks like coffee grounds.  You have: ? A fever. ? Bloody, black, or tarry stools. ? A severe sore throat or you cannot swallow. ? Difficulty breathing. ? Severe pain in your chest or abdomen. Summary  After the procedure, it is common to have a sore throat, mild stomach discomfort, bloating, and nausea.  Do not drive for 24 hours if you were given a sedative during the procedure.  Follow instructions from your health care provider about what to eat or drink after your procedure.  Return to your normal activities as told by your health care  provider. This information is not intended to replace advice given to you by your health care provider. Make sure you discuss any questions you have with your health care provider. Document Revised: 04/14/2018 Document Reviewed: 03/23/2018 Elsevier Patient Education  [place] A soft-food eating plan includes foods that are safe and easy to chew and swallow. Your health care provider or dietitian can help you find foods and flavors that fit into this plan. Follow this plan until your health care provider or dietitian says it is safe to start eating other foods and food textures. What are tips for following this plan? General guidelines   Take small bites of food, or cut food into pieces about  inch or smaller. Bite-sized pieces of food are easier to chew and swallow.  Eat moist foods. Avoid overly dry foods.  Avoid foods that: ? Are difficult to swallow, such as dry, chunky, crispy, or sticky foods. ? Are difficult to chew, such as hard, tough, or stringy foods. ? Contain nuts, seeds, or fruits.  Follow instructions from your dietitian about the types of liquids that are safe for you to swallow. You may be allowed to have: ? Thick liquids only. This includes only liquids that are thicker than honey. ? Thin and thick liquids. This includes all beverages and foods that become liquid at room temperature.  To make thick liquids: ? Purchase a commercial liquid thickening powder. These are available at grocery stores and pharmacies. ? Mix the thickener into  liquids according to instructions on the label. ? Purchase ready-made thickened liquids. ? Thicken soup by pureeing, straining to remove chunks, and adding flour, potato flakes, or corn starch. ? Add commercial thickener to foods that become liquid at room temperature, such as milk shakes, yogurt, ice cream, gelatin, and sherbet.  Ask your health care provider whether you need to take a fiber  supplement. Cooking  Cook meats so they stay tender and moist. Use methods like braising, stewing, or baking in liquid.  Cook vegetables and fruit until they are soft enough to be mashed with a fork.  Peel soft, fresh fruits such as peaches, nectarines, and melons.  When making soup, make sure chunks of meat and vegetables are smaller than  inch.  Reheat leftover foods slowly so that a tough crust does not form. What foods are allowed? The items listed below may not be a complete list. Talk with your dietitian about what dietary choices are best for you. Grains Breads, muffins, pancakes, or waffles moistened with syrup, jelly, or butter. Dry cereals well-moistened with milk. Moist, cooked cereals. Well-cooked pasta and rice. Vegetables All soft-cooked vegetables. Shredded lettuce. Fruits All canned and cooked fruits. Soft, peeled fresh fruits. Strawberries. Dairy Milk. Cream. Yogurt. Cottage cheese. Soft cheese without the rind. Meats and other protein foods Tender, moist ground meat, poultry, or fish. Meat cooked in gravy or sauces. Eggs. Sweets and desserts Ice cream. Milk shakes. Sherbet. Pudding. Fats and oils Butter. Margarine. Olive, canola, sunflower, and grapeseed oil. Smooth salad dressing. Smooth cream cheese. Mayonnaise. Gravy. What foods are not allowed? The items listed bemay not be a complete list. Talk with your dietitian about what dietary choices are best for you. Grains Coarse or dry cereals, such as bran, granola, and shredded wheat. Tough or chewy crusty breads, such as [place] bread or baguettes. Breads with nuts, seeds, or fruit. Vegetables All raw vegetables. Cooked corn. Cooked vegetables that are tough or stringy. Tough, crisp, fried potatoes and potato skins. Fruits Fresh fruits with skins or seeds, or both, such as apples, pears, and grapes. Stringy, high-pulp fruits, such as papaya, pineapple, coconut, and mango. Fruit leather and all dried  fruit. Dairy Yogurt with nuts or coconut. Meats and other protein foods Hard, dry sausages. Dry meat, poultry, or fish. Meats with gristle. Fish with bones. Fried meat or fish. Lunch meat and hotdogs. Nuts and seeds. Chunky peanut butter or other nut butters. Sweets and desserts Cakes or cookies that are very dry or chewy. Desserts with dried fruit, nuts, or coconut. Fried pastries. Very rich pastries. Fats and oils Cream cheese with fruit or nuts. Salad dressings with seeds or chunks. Summary  A soft-food eating plan includes foods that are safe and easy to swallow. Generally, the foods should be soft enough to be mashed with a fork.  Avoid foods that are dry, hard to chew, crunchy, sticky, stringy, or crispy.  Ask your health care provider whether you need to thicken your liquids and if you need to take a fiber supplement. This information is not intended to replace advice given to you by your health care provider. Make sure you discuss any questions you have with your health care provider. Document Revised: 02/11/2019 Document Reviewed: 12/24/2016 Elsevier Patient Education  [place].

## 2020-05-12 LAB — SURGICAL PATHOLOGY

## 2020-05-17 ENCOUNTER — Encounter (HOSPITAL_COMMUNITY): Payer: Self-pay | Admitting: Internal Medicine

## 2020-05-17 DIAGNOSIS — Z0000 Encounter for general adult medical examination without abnormal findings: Secondary | ICD-10-CM | POA: Diagnosis not present

## 2020-05-17 DIAGNOSIS — I10 Essential (primary) hypertension: Secondary | ICD-10-CM | POA: Diagnosis not present

## 2020-05-17 DIAGNOSIS — R0781 Pleurodynia: Secondary | ICD-10-CM | POA: Diagnosis not present

## 2020-05-17 DIAGNOSIS — E785 Hyperlipidemia, unspecified: Secondary | ICD-10-CM | POA: Diagnosis not present

## 2020-05-17 DIAGNOSIS — N181 Chronic kidney disease, stage 1: Secondary | ICD-10-CM | POA: Diagnosis not present

## 2020-07-25 ENCOUNTER — Encounter (INDEPENDENT_AMBULATORY_CARE_PROVIDER_SITE_OTHER): Payer: Self-pay

## 2020-07-26 ENCOUNTER — Ambulatory Visit (INDEPENDENT_AMBULATORY_CARE_PROVIDER_SITE_OTHER): Payer: Medicare Other | Admitting: Gastroenterology

## 2020-08-21 ENCOUNTER — Ambulatory Visit (INDEPENDENT_AMBULATORY_CARE_PROVIDER_SITE_OTHER): Payer: Medicare Other | Admitting: Gastroenterology

## 2020-08-30 DIAGNOSIS — E785 Hyperlipidemia, unspecified: Secondary | ICD-10-CM | POA: Diagnosis not present

## 2020-08-30 DIAGNOSIS — Z0000 Encounter for general adult medical examination without abnormal findings: Secondary | ICD-10-CM | POA: Diagnosis not present

## 2020-08-30 DIAGNOSIS — I10 Essential (primary) hypertension: Secondary | ICD-10-CM | POA: Diagnosis not present

## 2020-08-30 DIAGNOSIS — N181 Chronic kidney disease, stage 1: Secondary | ICD-10-CM | POA: Diagnosis not present

## 2020-09-11 ENCOUNTER — Other Ambulatory Visit: Payer: Self-pay

## 2020-09-11 ENCOUNTER — Encounter (INDEPENDENT_AMBULATORY_CARE_PROVIDER_SITE_OTHER): Payer: Self-pay | Admitting: Gastroenterology

## 2020-09-11 ENCOUNTER — Ambulatory Visit (INDEPENDENT_AMBULATORY_CARE_PROVIDER_SITE_OTHER): Payer: Medicare Other | Admitting: Gastroenterology

## 2020-09-11 VITALS — BP 120/75 | Temp 97.8°F | Ht 67.0 in | Wt 228.7 lb

## 2020-09-11 DIAGNOSIS — K5909 Other constipation: Secondary | ICD-10-CM

## 2020-09-11 DIAGNOSIS — Z86010 Personal history of colonic polyps: Secondary | ICD-10-CM

## 2020-09-11 DIAGNOSIS — K219 Gastro-esophageal reflux disease without esophagitis: Secondary | ICD-10-CM | POA: Diagnosis not present

## 2020-09-11 MED ORDER — PANTOPRAZOLE SODIUM 40 MG PO TBEC: DELAYED_RELEASE_TABLET | ORAL | 3 refills | Status: DC

## 2020-09-11 NOTE — Progress Notes (Signed)
Patient profile: Ernest Barnett is a 64 y.o. male seen for follow up. Last seen for EGD 05/2020 as below.   History of Present Illness: Ernest Barnett is seen today for follow-up. He reports having resolution of dysphagia after endoscopy 3 months ago. He is currently not feeling any GERD nausea vomiting or epigastric pain. He is taking PPI twice a day. He denies NSAID use. Denies severe GERD symptoms before starting PPI.  He has constipation & uses MiraLAX every other day. He does work as a Presenter, broadcasting and does not take before working given it can be a long ways from the restroom. He denies any blood in stool or abdominal pain. Feels his bowels are doing well w/ current regimen. Appetite good and weight stable. No concerns today.  Non-smoker. No alcohol.  Wt Readings from Last 3 Encounters:  09/11/20 228 lb 11.2 oz (103.7 kg)  04/25/20 222 lb 4.8 oz (100.8 kg)  11/12/18 223 lb (101.2 kg)     Last Colonoscopy: 11/2018-- One small polyp in the cecum. Biopsied. - Two small polyps in the rectum and at the splenic flexure, removed with a cold snare. Resected and retrieved. - External hemorrhoids. Patient had 3 small polyps and 2 were tubular adenomas. Results given to patient. He will need follow-up colonoscopy in 5 years.    Last Endoscopy: 05/2020-Normal hypopharynx. - LA Grade A esophagitis with no bleeding. - Esophageal mucosal changes consistent with short-segment Barrett's esophagus. Biopsied. - Benign-appearing esophageal stenosis. Dilated. - 3 cm hiatal hernia. - Normal stomach. - Normal duodenal bulb and second portion of the duodenum. comment; esophageal ulcers have healed and now he has developed short segment Barretts. Distal esophageal biopsy confirms Barrett's esophagus.     Past Medical History:  Past Medical History:  Diagnosis Date  . COPD (chronic obstructive pulmonary disease) ([place])   . CVA (cerebral vascular accident) ([place]) 2013  . HOH (hard of hearing)    . Hypercholesteremia   . Hypertension   . Stroke ([place]) 03/24/2012   left sided weakness  . Vitamin D deficiency     Problem List: Patient Active Problem List   Diagnosis Date Noted  . GERD (gastroesophageal reflux disease) 04/25/2020  . Chronic constipation 04/25/2020  . Hx of colonic polyps 08/06/2018  . Dysphagia 08/06/2018    Past Surgical History: Past Surgical History:  Procedure Laterality Date  . BIOPSY  11/12/2018   Procedure: BIOPSY;  Surgeon: Rogene Houston, MD;  [facility];  Service: Endoscopy;;  colon  . BIOPSY  05/10/2020   Procedure: BIOPSY;  Surgeon: Rogene Houston, MD;  [facility];  Service: Endoscopy;;  esophagus  . CATARACT EXTRACTION     right eye  . CATARACT EXTRACTION W/PHACO  10/08/2012   Procedure: CATARACT EXTRACTION PHACO AND INTRAOCULAR LENS PLACEMENT (IOC);  Surgeon: Tonny Branch, MD;  Location: [facility];  Service: Ophthalmology;  Laterality: Left;  CDE:6.64  . CHOLECYSTECTOMY     MMH  . COLONOSCOPY N/A 11/12/2018   Procedure: COLONOSCOPY;  Surgeon: Rogene Houston, MD;  [facility];  Service: Endoscopy;  Laterality: N/A;  1030  . ELBOW FRACTURE SURGERY     left  . ESOPHAGEAL DILATION N/A 11/12/2018   Procedure: ESOPHAGEAL DILATION;  Surgeon: Rogene Houston, MD;  [facility];  Service: Endoscopy;  Laterality: N/A;  . ESOPHAGEAL DILATION N/A 05/10/2020   Procedure: ESOPHAGEAL DILATION;  Surgeon: Rogene Houston, MD;  [facility];  Service: Endoscopy;  Laterality: N/A;  . ESOPHAGOGASTRODUODENOSCOPY N/A 11/12/2018   Procedure: ESOPHAGOGASTRODUODENOSCOPY (EGD);  Surgeon: Rogene Houston, MD;  [facility];  Service: Endoscopy;  Laterality: N/A;  . ESOPHAGOGASTRODUODENOSCOPY N/A 05/10/2020   Procedure: ESOPHAGOGASTRODUODENOSCOPY (EGD);  Surgeon: Rogene Houston, MD;  [facility];  Service: Endoscopy;  Laterality: N/A;  210  . HERNIA REPAIR     right inguinal  . HYDROCELE  EXCISION / REPAIR    . POLYPECTOMY  11/12/2018   Procedure: POLYPECTOMY;  Surgeon: Rogene Houston, MD;  [facility];  Service: Endoscopy;;  colon   . RETINAL DETACHMENT SURGERY Left 2019  . TONSILLECTOMY    . VOCAL CORD INJECTION     removal of polyp-2005    Allergies: No Known Allergies    Home Medications:  Current Outpatient Medications:  .  amLODipine (NORVASC) 5 MG tablet, Take 5 mg by mouth daily., Disp: , Rfl:  .  aspirin EC 81 MG tablet, Take 1 tablet (81 mg total) by mouth daily., Disp: 30 tablet, Rfl: 11 .  Omega-3 Fatty Acids (FISH OIL) 1000 MG CAPS, Take 1,000 mg by mouth in the morning, at noon, and at bedtime. , Disp: , Rfl:  .  pantoprazole (PROTONIX) 40 MG tablet, TAKE 1 TABLET BY MOUTH ONCE A DAY BEFORE BREAKFAST, Disp: 90 tablet, Rfl: 3 .  polyethylene glycol (MIRALAX / GLYCOLAX) packet, Take 17 g by mouth daily. Patient states that he takes as needed. (Patient taking differently: Take 17 g by mouth daily as needed for mild constipation. ), Disp: 30 each, Rfl: 5 .  pravastatin (PRAVACHOL) 40 MG tablet, Take 40 mg by mouth daily., Disp: , Rfl:  .  sodium bicarbonate 650 MG tablet, Take 650 mg by mouth 2 (two) times daily., Disp: , Rfl:  .  tamsulosin (FLOMAX) 0.4 MG CAPS capsule, Take 0.4 mg by mouth daily. , Disp: , Rfl:    Family History: family history is not on file.    Social History:   reports that he has been smoking cigarettes. He has a 60.00 pack-year smoking history. He has never used smokeless tobacco. He reports current alcohol use. He reports that he does not use drugs.   Review of Systems: Constitutional: Denies weight loss/weight gain  Eyes: No changes in vision. ENT: No oral lesions, sore throat.  GI: see HPI.  Heme/Lymph: No easy bruising.  CV: No chest pain.  GU: No hematuria.  Integumentary: No rashes.  Neuro: No headaches.  Psych: No depression/anxiety.  Endocrine: No heat/cold intolerance.  Allergic/Immunologic: No  urticaria.  Resp: No cough, SOB.  Musculoskeletal: No joint swelling.    Physical Examination: BP 120/75 (BP Location: Left Arm, Patient Position: Sitting, Cuff Size: Normal)   Temp 97.8 F (36.6 C) (Oral)   Ht 5\' 7"  (1.702 m)   Wt 228 lb 11.2 oz (103.7 kg)   BMI 35.82 kg/m  Gen: NAD, alert and oriented x 4 HEENT: PEERLA, EOMI, Neck: supple, no JVD Chest: CTA bilaterally, no wheezes, crackles, or other adventitious sounds CV: RRR, no m/g/c/r Abd: soft, NT, ND, +BS in all four quadrants; no HSM, guarding, ridigity, or rebound tenderness Ext: no edema, well perfused with 2+ pulses, Skin: no rash or lesions noted on observed skin Lymph: no noted LAD  Data Reviewed:  No labs available in Care Everywhere.   Assessment/Plan: Ernest Barnett is a 65 y.o. male seen today for follow-up.  1. GERD/Barrett's-with history of esophageal ulcers that were healed  on last endoscopy. Currently on PPI twice daily. Will decrease to Protonix 40 mg once a day. He is laying flat about 15 minutes after eating - we discussed remaining upright after meals and avoiding trigger foods.We will plan for repeat endoscopy for Barrett's in 3 to 5 years. He will contact me if he gets any reflux symptoms with dosage decrease.  2. Constipation-mild and well-controlled with as needed MiraLAX.  3. History of colon polyps-due for repeat 2025.    Follow-up 1 year if doing well, he will call sooner with any concerns or new symptoms.  Ernest Barnett was seen today for dysphagia.  Diagnoses and all orders for this visit:  Chronic GERD  Personal history of colonic polyps  Chronic constipation  Other orders -     pantoprazole (PROTONIX) 40 MG tablet; TAKE 1 TABLET BY MOUTH ONCE A DAY BEFORE BREAKFAST        I personally performed the service, non-incident to. (WP)  Laurine Blazer, [facility]

## 2020-09-11 NOTE — Patient Instructions (Addendum)
We are decreasing Protonix to 40 mg once a day. Please contact me if you get any symptoms with the lower dose. Try to remain upright after meals as discussed. Follow-up in 1 year-sooner if needed

## 2020-12-04 DIAGNOSIS — Z0000 Encounter for general adult medical examination without abnormal findings: Secondary | ICD-10-CM | POA: Diagnosis not present

## 2020-12-04 DIAGNOSIS — E785 Hyperlipidemia, unspecified: Secondary | ICD-10-CM | POA: Diagnosis not present

## 2020-12-04 DIAGNOSIS — I10 Essential (primary) hypertension: Secondary | ICD-10-CM | POA: Diagnosis not present

## 2020-12-04 DIAGNOSIS — N181 Chronic kidney disease, stage 1: Secondary | ICD-10-CM | POA: Diagnosis not present

## 2020-12-05 ENCOUNTER — Other Ambulatory Visit (INDEPENDENT_AMBULATORY_CARE_PROVIDER_SITE_OTHER): Payer: Self-pay | Admitting: *Deleted

## 2020-12-05 MED ORDER — PANTOPRAZOLE SODIUM 40 MG PO TBEC: 40.0000 mg | DELAYED_RELEASE_TABLET | Freq: Two times a day (BID) | ORAL | 3 refills | Status: DC

## 2020-12-08 DIAGNOSIS — I129 Hypertensive chronic kidney disease with stage 1 through stage 4 chronic kidney disease, or unspecified chronic kidney disease: Secondary | ICD-10-CM | POA: Diagnosis not present

## 2020-12-08 DIAGNOSIS — E559 Vitamin D deficiency, unspecified: Secondary | ICD-10-CM | POA: Diagnosis not present

## 2020-12-08 DIAGNOSIS — N185 Chronic kidney disease, stage 5: Secondary | ICD-10-CM | POA: Diagnosis not present

## 2020-12-08 DIAGNOSIS — E872 Acidosis: Secondary | ICD-10-CM | POA: Diagnosis not present

## 2020-12-08 DIAGNOSIS — N170 Acute kidney failure with tubular necrosis: Secondary | ICD-10-CM | POA: Diagnosis not present

## 2020-12-08 DIAGNOSIS — Z79899 Other long term (current) drug therapy: Secondary | ICD-10-CM | POA: Diagnosis not present

## 2020-12-08 DIAGNOSIS — N189 Chronic kidney disease, unspecified: Secondary | ICD-10-CM | POA: Diagnosis not present

## 2020-12-08 DIAGNOSIS — R809 Proteinuria, unspecified: Secondary | ICD-10-CM | POA: Diagnosis not present

## 2020-12-08 DIAGNOSIS — D631 Anemia in chronic kidney disease: Secondary | ICD-10-CM | POA: Diagnosis not present

## 2020-12-12 DIAGNOSIS — E559 Vitamin D deficiency, unspecified: Secondary | ICD-10-CM | POA: Diagnosis not present

## 2020-12-12 DIAGNOSIS — Z79899 Other long term (current) drug therapy: Secondary | ICD-10-CM | POA: Diagnosis not present

## 2020-12-12 DIAGNOSIS — N170 Acute kidney failure with tubular necrosis: Secondary | ICD-10-CM | POA: Diagnosis not present

## 2020-12-12 DIAGNOSIS — I129 Hypertensive chronic kidney disease with stage 1 through stage 4 chronic kidney disease, or unspecified chronic kidney disease: Secondary | ICD-10-CM | POA: Diagnosis not present

## 2020-12-12 DIAGNOSIS — N185 Chronic kidney disease, stage 5: Secondary | ICD-10-CM | POA: Diagnosis not present

## 2020-12-12 DIAGNOSIS — R809 Proteinuria, unspecified: Secondary | ICD-10-CM | POA: Diagnosis not present

## 2020-12-13 ENCOUNTER — Other Ambulatory Visit: Payer: Self-pay | Admitting: Nephrology

## 2020-12-13 ENCOUNTER — Other Ambulatory Visit (HOSPITAL_COMMUNITY): Payer: Self-pay | Admitting: Nephrology

## 2020-12-13 DIAGNOSIS — I129 Hypertensive chronic kidney disease with stage 1 through stage 4 chronic kidney disease, or unspecified chronic kidney disease: Secondary | ICD-10-CM

## 2020-12-13 DIAGNOSIS — N170 Acute kidney failure with tubular necrosis: Secondary | ICD-10-CM

## 2020-12-13 DIAGNOSIS — N185 Chronic kidney disease, stage 5: Secondary | ICD-10-CM

## 2020-12-13 DIAGNOSIS — D631 Anemia in chronic kidney disease: Secondary | ICD-10-CM

## 2020-12-19 ENCOUNTER — Ambulatory Visit (HOSPITAL_COMMUNITY)
Admission: RE | Admit: 2020-12-19 | Discharge: 2020-12-19 | Disposition: A | Discharge status: Home / Self Care | Payer: Medicare Other | Source: Ambulatory Visit | Attending: Nephrology | Admitting: Nephrology

## 2020-12-19 ENCOUNTER — Other Ambulatory Visit: Payer: Self-pay

## 2020-12-19 DIAGNOSIS — D631 Anemia in chronic kidney disease: Secondary | ICD-10-CM | POA: Diagnosis not present

## 2020-12-19 DIAGNOSIS — N170 Acute kidney failure with tubular necrosis: Secondary | ICD-10-CM | POA: Insufficient documentation

## 2020-12-19 DIAGNOSIS — N179 Acute kidney failure, unspecified: Secondary | ICD-10-CM | POA: Diagnosis not present

## 2020-12-19 DIAGNOSIS — N185 Chronic kidney disease, stage 5: Secondary | ICD-10-CM

## 2020-12-19 DIAGNOSIS — N281 Cyst of kidney, acquired: Secondary | ICD-10-CM | POA: Diagnosis not present

## 2020-12-19 DIAGNOSIS — I129 Hypertensive chronic kidney disease with stage 1 through stage 4 chronic kidney disease, or unspecified chronic kidney disease: Secondary | ICD-10-CM | POA: Diagnosis not present

## 2021-01-12 ENCOUNTER — Other Ambulatory Visit (HOSPITAL_COMMUNITY): Payer: Self-pay | Admitting: Nephrology

## 2021-01-12 DIAGNOSIS — N2581 Secondary hyperparathyroidism of renal origin: Secondary | ICD-10-CM

## 2021-01-12 DIAGNOSIS — E872 Acidosis, unspecified: Secondary | ICD-10-CM

## 2021-01-12 DIAGNOSIS — N185 Chronic kidney disease, stage 5: Secondary | ICD-10-CM

## 2021-01-12 DIAGNOSIS — D472 Monoclonal gammopathy: Secondary | ICD-10-CM

## 2021-01-12 DIAGNOSIS — D631 Anemia in chronic kidney disease: Secondary | ICD-10-CM

## 2021-01-12 DIAGNOSIS — N281 Cyst of kidney, acquired: Secondary | ICD-10-CM | POA: Diagnosis not present

## 2021-01-12 DIAGNOSIS — I120 Hypertensive chronic kidney disease with stage 5 chronic kidney disease or end stage renal disease: Secondary | ICD-10-CM

## 2021-01-12 DIAGNOSIS — E211 Secondary hyperparathyroidism, not elsewhere classified: Secondary | ICD-10-CM | POA: Diagnosis not present

## 2021-01-12 DIAGNOSIS — R809 Proteinuria, unspecified: Secondary | ICD-10-CM | POA: Diagnosis not present

## 2021-01-12 DIAGNOSIS — N189 Chronic kidney disease, unspecified: Secondary | ICD-10-CM | POA: Diagnosis not present

## 2021-01-15 ENCOUNTER — Encounter (HOSPITAL_COMMUNITY): Payer: Self-pay | Admitting: Radiology

## 2021-01-15 NOTE — Progress Notes (Signed)
Ernest Barnett Male, 65 y.o., 02-25-1956  MRN:  [id number] Phone:  [phone] [name])       PCP:  Neale Burly, MD Coverage:  [place] Medicare            RE: US Kidney Biopsy Received: Today [name], [name], MD  Garth Bigness D  This will be a random renal biopsy. Thanks for asking to clarify.   [name]        Previous Messages   ----- Message -----  From: Garth Bigness D  Sent: 01/12/2021  5:14 PM EDT  To: [facility] Procedure Requests  Subject: US Kidney Biopsy                 Question whether Random or needs Review, to me could go both way because it mentions Renal Cyst also.   Procedure:  US Kidney Biopsy   Reason: Stage 5 chronic kidney disease due to benign hypertension  Monoclonal gammopathy of undetermined significance  Anemia of chronic renal failure, unspecified CKD stage  Proteinuria, unspecified type  Secondary hyperparathyroidism  Complex renal cyst  Acidosis  Simple renal cyst   History: US Renal in computer   Provider: Liana Gerold   Provider Contact: [phone]

## 2021-01-23 ENCOUNTER — Other Ambulatory Visit: Payer: Self-pay | Admitting: *Deleted

## 2021-01-23 DIAGNOSIS — N186 End stage renal disease: Secondary | ICD-10-CM

## 2021-01-24 ENCOUNTER — Other Ambulatory Visit: Payer: Self-pay | Admitting: Radiology

## 2021-01-25 ENCOUNTER — Ambulatory Visit (INDEPENDENT_AMBULATORY_CARE_PROVIDER_SITE_OTHER): Payer: Medicare HMO

## 2021-01-25 ENCOUNTER — Other Ambulatory Visit (HOSPITAL_COMMUNITY): Payer: Self-pay | Admitting: Nephrology

## 2021-01-25 ENCOUNTER — Ambulatory Visit (HOSPITAL_COMMUNITY)
Admission: RE | Admit: 2021-01-25 | Discharge: 2021-01-25 | Disposition: A | Discharge status: Home / Self Care | Payer: Medicare HMO | Source: Ambulatory Visit | Attending: Nephrology | Admitting: Nephrology

## 2021-01-25 ENCOUNTER — Other Ambulatory Visit: Payer: Self-pay

## 2021-01-25 ENCOUNTER — Encounter (HOSPITAL_COMMUNITY): Payer: Self-pay

## 2021-01-25 DIAGNOSIS — E872 Acidosis, unspecified: Secondary | ICD-10-CM

## 2021-01-25 DIAGNOSIS — N186 End stage renal disease: Secondary | ICD-10-CM | POA: Diagnosis not present

## 2021-01-25 DIAGNOSIS — I120 Hypertensive chronic kidney disease with stage 5 chronic kidney disease or end stage renal disease: Secondary | ICD-10-CM

## 2021-01-25 DIAGNOSIS — N2581 Secondary hyperparathyroidism of renal origin: Secondary | ICD-10-CM

## 2021-01-25 DIAGNOSIS — E211 Secondary hyperparathyroidism, not elsewhere classified: Secondary | ICD-10-CM | POA: Diagnosis not present

## 2021-01-25 DIAGNOSIS — N281 Cyst of kidney, acquired: Secondary | ICD-10-CM

## 2021-01-25 DIAGNOSIS — D631 Anemia in chronic kidney disease: Secondary | ICD-10-CM | POA: Diagnosis not present

## 2021-01-25 DIAGNOSIS — R809 Proteinuria, unspecified: Secondary | ICD-10-CM | POA: Diagnosis not present

## 2021-01-25 DIAGNOSIS — N185 Chronic kidney disease, stage 5: Secondary | ICD-10-CM

## 2021-01-25 DIAGNOSIS — D472 Monoclonal gammopathy: Secondary | ICD-10-CM | POA: Diagnosis not present

## 2021-01-25 DIAGNOSIS — N189 Chronic kidney disease, unspecified: Secondary | ICD-10-CM | POA: Diagnosis not present

## 2021-01-25 LAB — CBC
HCT: 35.5 % — ABNORMAL LOW (ref 39.0–52.0)
Hemoglobin: 11.9 g/dL — ABNORMAL LOW (ref 13.0–17.0)
MCH: 31.5 pg (ref 26.0–34.0)
MCHC: 33.5 g/dL (ref 30.0–36.0)
MCV: 93.9 fL (ref 80.0–100.0)
Platelets: 227 10*3/uL (ref 150–400)
RBC: 3.78 MIL/uL — ABNORMAL LOW (ref 4.22–5.81)
RDW: 14.6 % (ref 11.5–15.5)
WBC: 11.2 10*3/uL — ABNORMAL HIGH (ref 4.0–10.5)
nRBC: 0 % (ref 0.0–0.2)

## 2021-01-25 LAB — PROTIME-INR
INR: 1.1 (ref 0.8–1.2)
Prothrombin Time: 13.6 seconds (ref 11.4–15.2)

## 2021-01-25 MED ORDER — SODIUM CHLORIDE 0.9 % IV SOLN: INTRAVENOUS | Status: DC

## 2021-01-25 MED ORDER — FENTANYL CITRATE (PF) 100 MCG/2ML IJ SOLN
INTRAMUSCULAR | Status: AC
  Filled 2021-01-25: qty 2

## 2021-01-25 MED ORDER — GELATIN ABSORBABLE 12-7 MM EX MISC
CUTANEOUS | Status: AC
  Filled 2021-01-25: qty 1

## 2021-01-25 MED ORDER — MIDAZOLAM HCL 2 MG/2ML IJ SOLN
INTRAMUSCULAR | Status: AC
  Filled 2021-01-25: qty 2

## 2021-01-25 MED ORDER — LIDOCAINE HCL (PF) 1 % IJ SOLN
INTRAMUSCULAR | Status: AC
  Filled 2021-01-25: qty 30

## 2021-01-25 MED ORDER — FENTANYL CITRATE (PF) 100 MCG/2ML IJ SOLN
INTRAMUSCULAR | Status: AC | PRN
  Administered 2021-01-25: 50 ug via INTRAVENOUS

## 2021-01-25 MED ORDER — MIDAZOLAM HCL 2 MG/2ML IJ SOLN
INTRAMUSCULAR | Status: AC | PRN
  Administered 2021-01-25: 1 mg via INTRAVENOUS

## 2021-01-25 NOTE — H&P (Signed)
Chief Complaint: Patient was seen in consultation today for proteinuria  Referring Physician(s): Valmy S  Supervising Physician: Corrie Mckusick  Patient Status: [facility] - Out-pt  History of Present Illness: Ernest Barnett is a 64 y.o. male with past medical history of COPD, HTN, hearing loss/deaf who has experienced worsening renal function, now Stage 5 CKD with proteinuria.  IR consulted for renal biopsy at the request of Dr. [name].   Patient presents to [facility]iology today in his usual state of health.  He has been NPO.  He does not take blood thinners.  Denies new complaints or concerns. He is agreeable to procedure today.   Past Medical History:  Diagnosis Date  . COPD (chronic obstructive pulmonary disease) ([place])   . CVA (cerebral vascular accident) ([place]) 2013  . HOH (hard of hearing)   . Hypercholesteremia   . Hypertension   . Stroke ([place]) 03/24/2012   left sided weakness  . Vitamin D deficiency     Past Surgical History:  Procedure Laterality Date  . BIOPSY  11/12/2018   Procedure: BIOPSY;  Surgeon: Rogene Houston, MD;  Location: [facility];  Service: Endoscopy;;  colon  . BIOPSY  05/10/2020   Procedure: BIOPSY;  Surgeon: Rogene Houston, MD;  Location: [facility];  Service: Endoscopy;;  esophagus  . CATARACT EXTRACTION     right eye  . CATARACT EXTRACTION W/PHACO  10/08/2012   Procedure: CATARACT EXTRACTION PHACO AND INTRAOCULAR LENS PLACEMENT (IOC);  Surgeon: Tonny Branch, MD;  Location: [facility] ORS;  Service: Ophthalmology;  Laterality: Left;  CDE:6.64  . CHOLECYSTECTOMY     MM[facility]  . COLONOSCOPY N/A 11/12/2018   Procedure: COLONOSCOPY;  Surgeon: Rogene Houston, MD;  Location: [facility];  Service: Endoscopy;  Laterality: N/A;  1030  . ELBOW FRACTURE SURGERY     left  . ESOPHAGEAL DILATION N/A 11/12/2018   Procedure: ESOPHAGEAL DILATION;  Surgeon: Rogene Houston, MD;  [facility];  Service: Endoscopy;  Laterality: N/A;  . ESOPHAGEAL  DILATION N/A 05/10/2020   Procedure: ESOPHAGEAL DILATION;  Surgeon: Rogene Houston, MD;  [facility];  Service: Endoscopy;  Laterality: N/A;  . ESOPHAGOGASTRODUODENOSCOPY N/A 11/12/2018   Procedure: ESOPHAGOGASTRODUODENOSCOPY (EGD);  Surgeon: Rogene Houston, MD;  [facility];  Service: Endoscopy;  Laterality: N/A;  . ESOPHAGOGASTRODUODENOSCOPY N/A 05/10/2020   Procedure: ESOPHAGOGASTRODUODENOSCOPY (EGD);  Surgeon: Rogene Houston, MD;  [facility];  Service: Endoscopy;  Laterality: N/A;  210  . HERNIA REPAIR     right inguinal  . HYDROCELE EXCISION / REPAIR    . POLYPECTOMY  11/12/2018   Procedure: POLYPECTOMY;  Surgeon: Rogene Houston, MD;  Location[facility];  Service: Endoscopy;;  colon   . RETINAL DETACHMENT SURGERY Left 2019  . TONSILLECTOMY    . VOCAL CORD INJECTION     removal of polyp-2005    Allergies: Patient has no known allergies.  Medications: Prior to Admission medications   Medication Sig Start Date End Date Taking? Authorizing Provider  amLODipine (NORVASC) 5 MG tablet Take 5 mg by mouth daily. 08/30/20  Yes [provider]  aspirin EC 81 MG tablet Take 1 tablet (81 mg total) by mouth daily. 05/13/20  Yes Rehman, Mechele Dawley, MD  calcitRIOL (ROCALTROL) 0.25 MCG capsule Take 0.25 mcg by mouth every Monday, Wednesday, and Friday.   Yes [provider]  cholecalciferol (VITAMIN D3) 25 MCG (1000 UNIT) tablet Take 1,000 Units by  mouth daily.   Yes [provider]  Omega-3 Fatty Acids (FISH OIL) 1000 MG CAPS Take 1,000 mg by mouth in the morning, at noon, and at bedtime.    Yes [provider]  pantoprazole (PROTONIX) 40 MG tablet Take 1 tablet (40 mg total) by mouth 2 (two) times daily. 12/05/20  Yes Rehman, Mechele Dawley, MD  polyethylene glycol (MIRALAX / GLYCOLAX) packet Take 17 g by mouth daily. Patient states that he takes as needed. Patient taking differently: Take 17 g by mouth daily as needed for mild  constipation. 03/31/18  Yes Rehman, Mechele Dawley, MD  pravastatin (PRAVACHOL) 40 MG tablet Take 40 mg by mouth daily.   Yes [provider]  sodium bicarbonate 650 MG tablet Take 1,300 mg by mouth 3 (three) times daily.   Yes [provider]  tamsulosin (FLOMAX) 0.4 MG CAPS capsule Take 0.4 mg by mouth daily.    Yes [provider]     History reviewed. No pertinent family history.  Social History   Socioeconomic History  . Marital status: Significant Other    Spouse name: Not on file  . Number of children: Not on file  . Years of education: Not on file  . Highest education level: Not on file  Occupational History  . Not on file  Tobacco Use  . Smoking status: Current Every Day Smoker    Packs/day: 2.00    Years: 30.00    Pack years: 60.00    Types: Cigarettes  . Smokeless tobacco: Never Used  Vaping Use  . Vaping Use: Every day  . Substances: Nicotine  Substance and Sexual Activity  . Alcohol use: Yes    Comment: occassional  . Drug use: No  . Sexual activity: Yes    Birth control/protection: None  Other Topics Concern  . Not on file  Social History Narrative  . Not on file   Social Determinants of Health   Financial Resource Strain: Not on file  Food Insecurity: Not on file  Transportation Needs: Not on file  Physical Activity: Not on file  Stress: Not on file  Social Connections: Not on file     Review of Systems: A 12 point ROS discussed and pertinent positives are indicated in the HPI above.  All other systems are negative.  Review of Systems  Constitutional: Negative for fatigue and fever.  Respiratory: Negative for cough and shortness of breath.   Cardiovascular: Negative for chest pain.  Gastrointestinal: Negative for abdominal pain, constipation, nausea and vomiting.  Musculoskeletal: Negative for back pain.  Psychiatric/Behavioral: Negative for behavioral problems and confusion.    Vital Signs: BP 115/84   Pulse 86   Temp  98.1 F (36.7 C) (Oral)   Ht 5\' 7"  (1.702 m)   Wt 235 lb (106.6 kg)   SpO2 98%   BMI 36.81 kg/m   Physical Exam Vitals and nursing note reviewed.  Constitutional:      General: He is not in acute distress.    Appearance: Normal appearance. He is not ill-appearing.  HENT:     Mouth/Throat:     Mouth: Mucous membranes are moist.     Pharynx: Oropharynx is clear.  Cardiovascular:     Rate and Rhythm: Normal rate and regular rhythm.  Pulmonary:     Effort: Pulmonary effort is normal. No respiratory distress.     Breath sounds: Normal breath sounds.  Abdominal:     General: Abdomen is flat.     Palpations: Abdomen  is soft.  Skin:    General: Skin is warm and dry.  Neurological:     General: No focal deficit present.     Mental Status: He is alert and oriented to person, place, and time. Mental status is at baseline.  Psychiatric:        Mood and Affect: Mood normal.        Behavior: Behavior normal.        Thought Content: Thought content normal.        Judgment: Judgment normal.      MD Evaluation Airway: WNL Heart: WNL Abdomen: WNL Chest/ Lungs: WNL ASA  Classification: 3 Mallampati/Airway Score: Three   Imaging: No results found.  Labs:  CBC: Recent Labs    01/25/21 0654  WBC 11.2*  HGB 11.9*  HCT 35.5*  PLT 227    COAGS: Recent Labs    01/25/21 0654  INR 1.1    BMP: No results for input(s): NA, K, CL, CO2, GLUCOSE, BUN, CALCIUM, CREATININE, GFRNONAA, GFRAA in the last 8760 hours.  Invalid input(s): CMP  LIVER FUNCTION TESTS: No results for input(s): BILITOT, AST, ALT, ALKPHOS, PROT, ALBUMIN in the last 8760 hours.  TUMOR MARKERS: No results for input(s): AFPTM, CEA, CA199, CHROMGRNA in the last 8760 hours.  Assessment and Plan: Patient with past medical history of HTN presents with complaint of proteinuria, progressive chronic kidney disease.  IR consulted for renal biopsy at the request of Dr. [name]. Case reviewed by Dr. Earleen Newport who  approves patient for procedure.  Patient presents today in their usual state of health.  He has been NPO and is not currently on blood thinners.  BP 115/84 today.  Risks and benefits of biopsy was discussed with the patient and/or patient's family including, but not limited to bleeding, infection, damage to adjacent structures or low yield requiring additional tests.  All of the questions were answered and there is agreement to proceed.  Consent signed and in chart.    Thank you for this interesting consult.  I greatly enjoyed meeting Ernest Barnett and look forward to participating in their care.  A copy of this report was sent to the requesting provider on this date.  Electronically Signed: Docia Barrier, PA 01/25/2021, 8:25 AM   I spent a total of  30 Minutes   in face to face in clinical consultation, greater than 50% of which was counseling/coordinating care for proteinuria.

## 2021-01-25 NOTE — Procedures (Signed)
Interventional Radiology Procedure Note  Procedure: US guided biopsy of left kidney, medical renal Complications: None EBL: None Recommendations: - Bedrest 2 hours.   - Routine wound care - Follow up pathology - Advance diet   Signed,  Remedios Mckone, DO   

## 2021-01-25 NOTE — Discharge Instructions (Signed)
Percutaneous Kidney Biopsy, Care After This sheet gives you information about how to care for yourself after your procedure. Your health care provider may also give you more specific instructions. If you have problems or questions, contact your health care provider. What can I expect after the procedure? After the procedure, it is common to have:  Pain or soreness near the biopsy site.  Pink or cloudy urine for 24 hours after the procedure. This is normal. Follow these instructions at home: Activity  Return to your normal activities as told by your health care provider. Ask your health care provider what activities are safe for you.  If you were given a sedative during the procedure, it can affect you for several hours. Do not drive or operate machinery until your health care provider says that it is safe.  Do not lift anything that is heavier than 10 lb (4.5 kg), or the limit that you are told, until your health care provider says that it is safe. 5 days  Avoid activities that take a lot of effort until your health care provider approves. Most people will have to wait 2 weeks before returning to activities such as exercise or sex. General instructions  Take over-the-counter and prescription medicines only as told by your health care provider.  Follow instructions from your health care provider about eating or drinking restrictions.  You may remove your band-aid in 24 hours. Then you may shower.  Check your biopsy site every day for signs of infection. Check for: ? More redness, swelling, or pain. ? Fluid or blood. ? Warmth. ? Pus or a bad smell.  Keep all follow-up visits as told by your health care provider. This is important.   Contact a health care provider if:  You have more redness, swelling, or pain around your biopsy site.  You have fluid or blood coming from your biopsy site.  Your biopsy site feels warm to the touch.  You have pus or a bad smell coming from your  biopsy site.  You have blood in your urine more than 24 hours after your procedure. Get help right away if:  Your urine is dark red or brown.  You have a fever.  You are not able to urinate.  You feel burning when you urinate.  You feel dizzy or light-headed.  You have severe pain in your abdomen or side. Summary  After the procedure, it is common to have pain or soreness at the biopsy site and pink or cloudy urine for the first 24 hours.  Check your biopsy site each day for signs of infection, such as more redness, swelling, or pain; fluid, blood, pus or a bad smell coming from the biopsy site; or the biopsy site feeling warm to the touch.  Return to your normal activities as told by your health care provider. This information is not intended to replace advice given to you by your health care provider. Make sure you discuss any questions you have with your health care provider. Document Revised: 01/14/2020 Document Reviewed: 01/14/2020 Elsevier Patient Education  [place].

## 2021-01-29 ENCOUNTER — Ambulatory Visit: Payer: Medicare HMO | Admitting: Vascular Surgery

## 2021-01-29 ENCOUNTER — Encounter: Payer: Self-pay | Admitting: Vascular Surgery

## 2021-01-29 ENCOUNTER — Other Ambulatory Visit: Payer: Self-pay

## 2021-01-29 VITALS — BP 115/81 | HR 97 | Temp 99.5°F | Resp 18 | Ht 67.0 in | Wt 236.0 lb

## 2021-01-29 DIAGNOSIS — N184 Chronic kidney disease, stage 4 (severe): Secondary | ICD-10-CM | POA: Diagnosis not present

## 2021-01-29 NOTE — Progress Notes (Signed)
[facility] [place]  Patient name: Ernest Barnett MRN: [id number] DOB: 27-Jul-1956 Sex: male  REASON FOR CONSULT: Discuss access for hemodialysis  Nephrologist: [name]  HPI: Ernest Barnett is a 65 y.o. male, who is here today for discussion of access for hemodialysis.  He is here with his significant other who is helpful in providing history.  He does have a progressive renal insufficiency.  Long history of hypertension.  He is not on dialysis.  He does not have a pacemaker.  He has not had central venous catheters.  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  He is left-handed.  No history of cardiac disease  Past Medical History:  Diagnosis Date  . COPD (chronic obstructive pulmonary disease) ([place])   . CVA (cerebral vascular accident) ([place]) 2013  . HOH (hard of hearing)   . Hypercholesteremia   . Hypertension   . Stroke ([place]) 03/24/2012   left sided weakness  . Vitamin D deficiency     History reviewed. No pertinent family history.  SOCIAL HISTORY: Social History   Socioeconomic History  . Marital status: Significant Other    Spouse name: Not on file  . Number of children: Not on file  . Years of education: Not on file  . Highest education level: Not on file  Occupational History  . Not on file  Tobacco Use  . Smoking status: Current Every Day Smoker    Packs/day: 2.00    Years: 30.00    Pack years: 60.00    Types: Cigarettes, E-cigarettes  . Smokeless tobacco: Never Used  Vaping Use  . Vaping Use: Every day  . Substances: Nicotine  Substance and Sexual Activity  . Alcohol use: Yes    Comment: occassional  . Drug use: No  . Sexual activity: Yes    Birth control/protection: None  Other Topics Concern  . Not on file  Social History Narrative  . Not on file   Social Determinants of Health   Financial Resource Strain: Not on file  Food  Insecurity: Not on file  Transportation Needs: Not on file  Physical Activity: Not on file  Stress: Not on file  Social Connections: Not on file  Intimate Partner Violence: Not on file    No Known Allergies  Current Outpatient Medications  Medication Sig Dispense Refill  . amLODipine (NORVASC) 5 MG tablet Take 5 mg by mouth daily.    [name] aspirin EC 81 MG tablet Take 1 tablet (81 mg total) by mouth daily. 30 tablet 11  . calcitRIOL (ROCALTROL) 0.25 MCG capsule Take 0.25 mcg by mouth every Monday, Wednesday, and Friday.    . cholecalciferol (VITAMIN D3) 25 MCG (1000 UNIT) tablet Take 1,000 Units by mouth daily.    . Omega-3 Fatty Acids (FISH OIL) 1000 MG CAPS Take 1,000 mg by mouth in the morning, at noon, and at bedtime.     . pantoprazole (PROTONIX) 40 MG tablet Take 1 tablet (40 mg total) by mouth 2 (two) times daily. 180 tablet 3  . polyethylene glycol (MIRALAX / GLYCOLAX) packet Take 17 g by mouth daily. Patient states that he takes as needed. (Patient taking differently: Take 17 g by mouth daily as needed for mild constipation.) 30 each 5  . pravastatin (PRAVACHOL) 40 MG tablet Take 40 mg by mouth daily.    . sodium bicarbonate 650 MG tablet Take 1,300 mg by mouth 3 (three)  times daily.    . tamsulosin (FLOMAX) 0.4 MG CAPS capsule Take 0.4 mg by mouth daily.      No current facility-administered medications for this visit.    REVIEW OF SYSTEMS:  [X]  denotes positive finding, [ ]  denotes negative finding Cardiac  Comments:  Chest pain or chest pressure:    Shortness of breath upon exertion: x   Short of breath when lying flat:    Irregular heart rhythm:        Vascular    Pain in calf, thigh, or hip brought on by ambulation:    Pain in feet at night that wakes you up from your sleep:     Blood clot in your veins:    Leg swelling:         Pulmonary    Oxygen at home:    Productive cough:     Wheezing:  x       Neurologic    Sudden weakness in arms or legs:     Sudden  numbness in arms or legs:     Sudden onset of difficulty speaking or slurred speech:    Temporary loss of vision in one eye:     Problems with dizziness:         Gastrointestinal    Blood in stool:     Vomited blood:         Genitourinary    Burning when urinating:     Blood in urine:        Psychiatric    Major depression:         Hematologic    Bleeding problems:    Problems with blood clotting too easily:        Skin    Rashes or ulcers:        Constitutional    Fever or chills:      PHYSICAL EXAM: Vitals:   01/29/21 1154  BP: 115/81  Pulse: 97  Resp: 18  Temp: 99.5 F (37.5 C)  TempSrc: Other (Comment)  SpO2: 95%  Weight: 236 lb (107 kg)  Height: 5\' 7"  (1.702 m)    GENERAL: The patient is a well-nourished male, in no acute distress. The vital signs are documented above. CARDIOVASCULAR: 2+ radial pulses bilaterally.  Moderate size cephalic vein superficially bilaterally PULMONARY: There is good air exchange  MUSCULOSKELETAL: There are no major deformities or cyanosis. NEUROLOGIC: No focal weakness or paresthesias are detected. SKIN: There are no ulcers or rashes noted. PSYCHIATRIC: The patient has a normal affect.  DATA:  Noninvasive studies from 01/25/2021 were reviewed.  This reveals moderate size cephalic vein throughout the course in the forearm and upper arm.  He does have normal triphasic flow at the wrist bilaterally  MEDICAL ISSUES: Had a very long discussion with the patient and his significant other.  Discussed option for hemodialysis.  I discussed tunneled catheters, AV fistula and AV graft.  I discussed the advantages and disadvantages of each of these.  He does have adequate cephalic vein for fistula attempt.  He is left-handed so therefore would recommend right arm radiocephalic fistula attempt.  If at the time of surgery is vein appears small at this level proceed with brachiocephalic.  He understands ongoing need for maintenance for any access  and the possibility of nonmaturation.  He is scheduled for surgery at [facility] on 02/08/2021   [name], MD [facility] Tel [phone] Pager [phone]  Note: Portions of this report may have been transcribed using voice recognition software.  Every effort has been made to ensure accuracy; however, inadvertent computerized transcription errors may still be present.  

## 2021-01-29 NOTE — H&P (View-Only) (Signed)
[facility] [place]  Patient name: Ernest Barnett MRN: [id number] DOB: January 15, 1956 Sex: male  REASON FOR CONSULT: Discuss access for hemodialysis  Nephrologist: [name]  HPI: Ernest Barnett is a 65 y.o. male, who is here today for discussion of access for hemodialysis.  He is here with his significant other who is helpful in providing history.  He does have a progressive renal insufficiency.  Long history of hypertension.  He is not on dialysis.  He does not have a pacemaker.  He has not had central venous catheters.  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  He is left-handed.  No history of cardiac disease  Past Medical History:  Diagnosis Date  . COPD (chronic obstructive pulmonary disease) ([place])   . CVA (cerebral vascular accident) ([place]) 2013  . HOH (hard of hearing)   . Hypercholesteremia   . Hypertension   . Stroke ([place]) 03/24/2012   left sided weakness  . Vitamin D deficiency     History reviewed. No pertinent family history.  SOCIAL HISTORY: Social History   Socioeconomic History  . Marital status: Significant Other    Spouse name: Not on file  . Number of children: Not on file  . Years of education: Not on file  . Highest education level: Not on file  Occupational History  . Not on file  Tobacco Use  . Smoking status: Current Every Day Smoker    Packs/day: 2.00    Years: 30.00    Pack years: 60.00    Types: Cigarettes, E-cigarettes  . Smokeless tobacco: Never Used  Vaping Use  . Vaping Use: Every day  . Substances: Nicotine  Substance and Sexual Activity  . Alcohol use: Yes    Comment: occassional  . Drug use: No  . Sexual activity: Yes    Birth control/protection: None  Other Topics Concern  . Not on file  Social History Narrative  . Not on file   Social Determinants of Health   Financial Resource Strain: Not on file  Food  Insecurity: Not on file  Transportation Needs: Not on file  Physical Activity: Not on file  Stress: Not on file  Social Connections: Not on file  Intimate Partner Violence: Not on file    No Known Allergies  Current Outpatient Medications  Medication Sig Dispense Refill  . amLODipine (NORVASC) 5 MG tablet Take 5 mg by mouth daily.    [name] aspirin EC 81 MG tablet Take 1 tablet (81 mg total) by mouth daily. 30 tablet 11  . calcitRIOL (ROCALTROL) 0.25 MCG capsule Take 0.25 mcg by mouth every Monday, Wednesday, and Friday.    . cholecalciferol (VITAMIN D3) 25 MCG (1000 UNIT) tablet Take 1,000 Units by mouth daily.    . Omega-3 Fatty Acids (FISH OIL) 1000 MG CAPS Take 1,000 mg by mouth in the morning, at noon, and at bedtime.     . pantoprazole (PROTONIX) 40 MG tablet Take 1 tablet (40 mg total) by mouth 2 (two) times daily. 180 tablet 3  . polyethylene glycol (MIRALAX / GLYCOLAX) packet Take 17 g by mouth daily. Patient states that he takes as needed. (Patient taking differently: Take 17 g by mouth daily as needed for mild constipation.) 30 each 5  . pravastatin (PRAVACHOL) 40 MG tablet Take 40 mg by mouth daily.    . sodium bicarbonate 650 MG tablet Take 1,300 mg by mouth 3 (three)  times daily.    . tamsulosin (FLOMAX) 0.4 MG CAPS capsule Take 0.4 mg by mouth daily.      No current facility-administered medications for this visit.    REVIEW OF SYSTEMS:  [X]  denotes positive finding, [ ]  denotes negative finding Cardiac  Comments:  Chest pain or chest pressure:    Shortness of breath upon exertion: x   Short of breath when lying flat:    Irregular heart rhythm:        Vascular    Pain in calf, thigh, or hip brought on by ambulation:    Pain in feet at night that wakes you up from your sleep:     Blood clot in your veins:    Leg swelling:         Pulmonary    Oxygen at home:    Productive cough:     Wheezing:  x       Neurologic    Sudden weakness in arms or legs:     Sudden  numbness in arms or legs:     Sudden onset of difficulty speaking or slurred speech:    Temporary loss of vision in one eye:     Problems with dizziness:         Gastrointestinal    Blood in stool:     Vomited blood:         Genitourinary    Burning when urinating:     Blood in urine:        Psychiatric    Major depression:         Hematologic    Bleeding problems:    Problems with blood clotting too easily:        Skin    Rashes or ulcers:        Constitutional    Fever or chills:      PHYSICAL EXAM: Vitals:   01/29/21 1154  BP: 115/81  Pulse: 97  Resp: 18  Temp: 99.5 F (37.5 C)  TempSrc: Other (Comment)  SpO2: 95%  Weight: 236 lb (107 kg)  Height: 5\' 7"  (1.702 m)    GENERAL: The patient is a well-nourished male, in no acute distress. The vital signs are documented above. CARDIOVASCULAR: 2+ radial pulses bilaterally.  Moderate size cephalic vein superficially bilaterally PULMONARY: There is good air exchange  MUSCULOSKELETAL: There are no major deformities or cyanosis. NEUROLOGIC: No focal weakness or paresthesias are detected. SKIN: There are no ulcers or rashes noted. PSYCHIATRIC: The patient has a normal affect.  DATA:  Noninvasive studies from 01/25/2021 were reviewed.  This reveals moderate size cephalic vein throughout the course in the forearm and upper arm.  He does have normal triphasic flow at the wrist bilaterally  MEDICAL ISSUES: Had a very long discussion with the patient and his significant other.  Discussed option for hemodialysis.  I discussed tunneled catheters, AV fistula and AV graft.  I discussed the advantages and disadvantages of each of these.  He does have adequate cephalic vein for fistula attempt.  He is left-handed so therefore would recommend right arm radiocephalic fistula attempt.  If at the time of surgery is vein appears small at this level proceed with brachiocephalic.  He understands ongoing need for maintenance for any access  and the possibility of nonmaturation.  He is scheduled for surgery at [facility] on 02/08/2021   [name], MD [facility] Tel [phone] Pager [phone]  Note: Portions of this report may have been transcribed using voice recognition software.  Every effort has been made to ensure accuracy; however, inadvertent computerized transcription errors may still be present.  

## 2021-01-31 ENCOUNTER — Other Ambulatory Visit: Payer: Self-pay

## 2021-02-01 ENCOUNTER — Inpatient Hospital Stay (HOSPITAL_COMMUNITY): Payer: Medicare HMO

## 2021-02-01 ENCOUNTER — Ambulatory Visit (HOSPITAL_COMMUNITY)
Admission: RE | Admit: 2021-02-01 | Discharge: 2021-02-01 | Disposition: A | Discharge status: Home / Self Care | Payer: Medicare HMO | Source: Ambulatory Visit | Attending: Hematology | Admitting: Hematology

## 2021-02-01 ENCOUNTER — Inpatient Hospital Stay (HOSPITAL_COMMUNITY): Payer: Medicare HMO | Attending: Hematology | Admitting: Hematology

## 2021-02-01 ENCOUNTER — Encounter (HOSPITAL_COMMUNITY): Payer: Self-pay | Admitting: Hematology

## 2021-02-01 ENCOUNTER — Other Ambulatory Visit: Payer: Self-pay

## 2021-02-01 ENCOUNTER — Encounter (HOSPITAL_COMMUNITY): Payer: Self-pay

## 2021-02-01 VITALS — BP 119/82 | HR 86 | Temp 98.1°F | Resp 18 | Ht 67.0 in | Wt 237.0 lb

## 2021-02-01 DIAGNOSIS — D472 Monoclonal gammopathy: Secondary | ICD-10-CM | POA: Insufficient documentation

## 2021-02-01 DIAGNOSIS — Z8673 Personal history of transient ischemic attack (TIA), and cerebral infarction without residual deficits: Secondary | ICD-10-CM | POA: Insufficient documentation

## 2021-02-01 DIAGNOSIS — R69 Illness, unspecified: Secondary | ICD-10-CM | POA: Diagnosis not present

## 2021-02-01 DIAGNOSIS — Z87891 Personal history of nicotine dependence: Secondary | ICD-10-CM

## 2021-02-01 DIAGNOSIS — N189 Chronic kidney disease, unspecified: Secondary | ICD-10-CM | POA: Insufficient documentation

## 2021-02-01 DIAGNOSIS — Z801 Family history of malignant neoplasm of trachea, bronchus and lung: Secondary | ICD-10-CM | POA: Insufficient documentation

## 2021-02-01 DIAGNOSIS — F17290 Nicotine dependence, other tobacco product, uncomplicated: Secondary | ICD-10-CM | POA: Diagnosis not present

## 2021-02-01 DIAGNOSIS — Z122 Encounter for screening for malignant neoplasm of respiratory organs: Secondary | ICD-10-CM

## 2021-02-01 DIAGNOSIS — N185 Chronic kidney disease, stage 5: Secondary | ICD-10-CM

## 2021-02-01 LAB — CBC WITH DIFFERENTIAL/PLATELET
Abs Immature Granulocytes: 0.04 10*3/uL (ref 0.00–0.07)
Basophils Absolute: 0.1 10*3/uL (ref 0.0–0.1)
Basophils Relative: 1 %
Eosinophils Absolute: 0.2 10*3/uL (ref 0.0–0.5)
Eosinophils Relative: 2 %
HCT: 37.6 % — ABNORMAL LOW (ref 39.0–52.0)
Hemoglobin: 12.2 g/dL — ABNORMAL LOW (ref 13.0–17.0)
Immature Granulocytes: 0 %
Lymphocytes Relative: 43 %
Lymphs Abs: 4.8 10*3/uL — ABNORMAL HIGH (ref 0.7–4.0)
MCH: 31.3 pg (ref 26.0–34.0)
MCHC: 32.4 g/dL (ref 30.0–36.0)
MCV: 96.4 fL (ref 80.0–100.0)
Monocytes Absolute: 1.1 10*3/uL — ABNORMAL HIGH (ref 0.1–1.0)
Monocytes Relative: 10 %
Neutro Abs: 4.9 10*3/uL (ref 1.7–7.7)
Neutrophils Relative %: 44 %
Platelets: 231 10*3/uL (ref 150–400)
RBC: 3.9 MIL/uL — ABNORMAL LOW (ref 4.22–5.81)
RDW: 14.9 % (ref 11.5–15.5)
WBC: 11.1 10*3/uL — ABNORMAL HIGH (ref 4.0–10.5)
nRBC: 0 % (ref 0.0–0.2)

## 2021-02-01 LAB — COMPREHENSIVE METABOLIC PANEL
ALT: 13 U/L (ref 0–44)
AST: 7 U/L — ABNORMAL LOW (ref 15–41)
Albumin: 3.5 g/dL (ref 3.5–5.0)
Alkaline Phosphatase: 27 U/L — ABNORMAL LOW (ref 38–126)
Anion gap: 9 (ref 5–15)
BUN: 31 mg/dL — ABNORMAL HIGH (ref 8–23)
CO2: 20 mmol/L — ABNORMAL LOW (ref 22–32)
Calcium: 9.4 mg/dL (ref 8.9–10.3)
Chloride: 109 mmol/L (ref 98–111)
Creatinine, Ser: 4.81 mg/dL — ABNORMAL HIGH (ref 0.61–1.24)
GFR, Estimated: 13 mL/min — ABNORMAL LOW (ref >60)
Glucose, Bld: 91 mg/dL (ref 70–99)
Potassium: 4.5 mmol/L (ref 3.5–5.1)
Sodium: 138 mmol/L (ref 135–145)
Total Bilirubin: 0.6 mg/dL (ref 0.3–1.2)
Total Protein: 8.9 g/dL — ABNORMAL HIGH (ref 6.5–8.1)

## 2021-02-01 LAB — LACTATE DEHYDROGENASE: LDH: 130 U/L (ref 98–192)

## 2021-02-01 NOTE — Progress Notes (Signed)
Received referral for initial lung cancer screening scan. Contacted patient and obtained smoking history (started age 65 until the age of 53, smoking at least 2PPD former cigarette smoker, 84 pack year) as well as answering questions related to the screening process. Patient denies signs/symptoms of lung cancer such as weight loss or hemoptysis. Patient denies comorbidity that would prevent curative treatment if lung cancer were to be found. Patient had SDMV with Dr. Delton Coombes on 02/01/2021.

## 2021-02-01 NOTE — Patient Instructions (Signed)
[place] at [facility] Discharge Instructions  You were seen and examined today by Dr. Delton Coombes. Dr. Delton Coombes is a hematologist, meaning he specializes in blood disorders. Dr. Delton Coombes discussed your past medical history, family history of cancer/blood disorders, and the events that led to you being here today.  You were referred to Dr. Delton Coombes due to an abnormal protein that is being spilled into your urine. This is likely related to a blood disorder, known as MGUS. Dr. Delton Coombes has recommended additional blood work today. Dr. Delton Coombes has also recommended a lung cancer screening CT scan due to your history of smoking.   Dr. Delton Coombes will see you back in 2-3 weeks.   Thank you for choosing [place] at [facility] to provide your oncology and hematology care.  To afford each patient quality time with our provider, please arrive at least 15 minutes before your scheduled appointment time.   If you have a lab appointment with the [facility] please come in thru the Main Entrance and check in at the main information desk.  You need to re-schedule your appointment should you arrive 10 or more minutes late.  We strive to give you quality time with our providers, and arriving late affects you and other patients whose appointments are after yours.  Also, if you no show three or more times for appointments you may be dismissed from the clinic at the providers discretion.     Again, thank you for choosing [facility].  Our hope is that these requests will decrease the amount of time that you wait before being seen by our physicians.       _____________________________________________________________  Should you have questions after your visit to [facility], please contact our office at [phone] and follow the prompts.  Our office hours are 8:00 a.m. and 4:30 p.m. Monday - Friday.  Please note that voicemails  left after 4:00 p.m. may not be returned until the following business day.  We are closed weekends and major holidays.  You do have access to a nurse 24-7, just call the main number to the clinic [phone] and do not press any options, hold on the line and a nurse will answer the phone.    For prescription refill requests, have your pharmacy contact our office and allow 72 hours.    Due to Covid, you will need to wear a mask upon entering the hospital. If you do not have a mask, a mask will be given to you at the Main Entrance upon arrival. For doctor visits, patients may have 1 support person age 16 or older with them. For treatment visits, patients can not have anyone with them due to social distancing guidelines and our immunocompromised population.
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
## 2021-02-02 LAB — KAPPA/LAMBDA LIGHT CHAINS
Kappa free light chain: 794.6 mg/L — ABNORMAL HIGH (ref 3.3–19.4)
Kappa, lambda light chain ratio: 49.97 — ABNORMAL HIGH (ref 0.26–1.65)
Lambda free light chains: 15.9 mg/L (ref 5.7–26.3)

## 2021-02-02 LAB — BETA 2 MICROGLOBULIN, SERUM: Beta-2 Microglobulin: 5.5 mg/L — ABNORMAL HIGH (ref 0.6–2.4)

## 2021-02-05 ENCOUNTER — Encounter (HOSPITAL_COMMUNITY)
Admission: RE | Admit: 2021-02-05 | Discharge: 2021-02-05 | Disposition: A | Discharge status: Home / Self Care | Payer: Medicare HMO | Source: Ambulatory Visit | Attending: Vascular Surgery | Admitting: Vascular Surgery

## 2021-02-05 ENCOUNTER — Encounter (HOSPITAL_COMMUNITY): Payer: Self-pay

## 2021-02-05 ENCOUNTER — Other Ambulatory Visit: Payer: Self-pay

## 2021-02-05 HISTORY — DX: Gastro-esophageal reflux disease without esophagitis: K21.9

## 2021-02-05 HISTORY — DX: Chronic kidney disease, unspecified: N18.9

## 2021-02-05 LAB — PROTEIN ELECTROPHORESIS, SERUM
A/G Ratio: 1 (ref 0.7–1.7)
Albumin ELP: 4 g/dL (ref 2.9–4.4)
Alpha-1-Globulin: 0.3 g/dL (ref 0.0–0.4)
Alpha-2-Globulin: 0.7 g/dL (ref 0.4–1.0)
Beta Globulin: 0.8 g/dL (ref 0.7–1.3)
Gamma Globulin: 2.4 g/dL — ABNORMAL HIGH (ref 0.4–1.8)
Globulin, Total: 4.1 g/dL — ABNORMAL HIGH (ref 2.2–3.9)
M-Spike, %: 2 g/dL — ABNORMAL HIGH
Total Protein ELP: 8.1 g/dL (ref 6.0–8.5)

## 2021-02-05 LAB — IMMUNOFIXATION ELECTROPHORESIS
IgA: 42 mg/dL — ABNORMAL LOW (ref 61–437)
IgG (Immunoglobin G), Serum: 2556 mg/dL — ABNORMAL HIGH (ref 603–1613)
IgM (Immunoglobulin M), Srm: 19 mg/dL — ABNORMAL LOW (ref 20–172)
Total Protein ELP: 8.6 g/dL — ABNORMAL HIGH (ref 6.0–8.5)

## 2021-02-07 ENCOUNTER — Other Ambulatory Visit (HOSPITAL_COMMUNITY)
Admission: RE | Admit: 2021-02-07 | Discharge: 2021-02-07 | Disposition: A | Discharge status: Home / Self Care | Payer: Medicare HMO | Source: Ambulatory Visit | Attending: Vascular Surgery | Admitting: Vascular Surgery

## 2021-02-07 ENCOUNTER — Encounter (HOSPITAL_COMMUNITY)
Admission: RE | Admit: 2021-02-07 | Discharge: 2021-02-07 | Disposition: A | Discharge status: Home / Self Care | Payer: Medicare HMO | Source: Ambulatory Visit | Attending: Vascular Surgery | Admitting: Vascular Surgery

## 2021-02-07 ENCOUNTER — Other Ambulatory Visit: Payer: Self-pay

## 2021-02-07 DIAGNOSIS — Z01818 Encounter for other preprocedural examination: Secondary | ICD-10-CM | POA: Diagnosis not present

## 2021-02-07 DIAGNOSIS — Z20822 Contact with and (suspected) exposure to covid-19: Secondary | ICD-10-CM | POA: Insufficient documentation

## 2021-02-07 LAB — SARS CORONAVIRUS 2 (TAT 6-24 HRS): SARS Coronavirus 2: NEGATIVE

## 2021-02-08 ENCOUNTER — Ambulatory Visit (HOSPITAL_COMMUNITY): Payer: Medicare HMO | Admitting: Certified Registered Nurse Anesthetist

## 2021-02-08 ENCOUNTER — Other Ambulatory Visit: Payer: Self-pay

## 2021-02-08 ENCOUNTER — Ambulatory Visit (HOSPITAL_COMMUNITY)
Admission: RE | Admit: 2021-02-08 | Discharge: 2021-02-08 | Disposition: A | Discharge status: Home / Self Care | Payer: Medicare HMO | Attending: Vascular Surgery | Admitting: Vascular Surgery

## 2021-02-08 ENCOUNTER — Encounter (HOSPITAL_COMMUNITY): Payer: Self-pay | Admitting: Vascular Surgery

## 2021-02-08 ENCOUNTER — Encounter (HOSPITAL_COMMUNITY)
Admission: RE | Disposition: A | Discharge status: Home / Self Care | Payer: Self-pay | Source: Home / Self Care | Attending: Vascular Surgery

## 2021-02-08 DIAGNOSIS — Z992 Dependence on renal dialysis: Secondary | ICD-10-CM

## 2021-02-08 DIAGNOSIS — J449 Chronic obstructive pulmonary disease, unspecified: Secondary | ICD-10-CM | POA: Diagnosis not present

## 2021-02-08 DIAGNOSIS — Z79899 Other long term (current) drug therapy: Secondary | ICD-10-CM | POA: Insufficient documentation

## 2021-02-08 DIAGNOSIS — R69 Illness, unspecified: Secondary | ICD-10-CM | POA: Diagnosis not present

## 2021-02-08 DIAGNOSIS — N185 Chronic kidney disease, stage 5: Secondary | ICD-10-CM | POA: Diagnosis not present

## 2021-02-08 DIAGNOSIS — Z7982 Long term (current) use of aspirin: Secondary | ICD-10-CM | POA: Diagnosis not present

## 2021-02-08 DIAGNOSIS — I120 Hypertensive chronic kidney disease with stage 5 chronic kidney disease or end stage renal disease: Secondary | ICD-10-CM | POA: Diagnosis not present

## 2021-02-08 DIAGNOSIS — F17210 Nicotine dependence, cigarettes, uncomplicated: Secondary | ICD-10-CM | POA: Diagnosis not present

## 2021-02-08 DIAGNOSIS — N186 End stage renal disease: Secondary | ICD-10-CM

## 2021-02-08 HISTORY — PX: AV FISTULA PLACEMENT: SHX1204

## 2021-02-08 LAB — POCT I-STAT, CHEM 8
BUN: 37 mg/dL — ABNORMAL HIGH (ref 8–23)
Calcium, Ion: 1.12 mmol/L — ABNORMAL LOW (ref 1.15–1.40)
Chloride: 110 mmol/L (ref 98–111)
Creatinine, Ser: 5.7 mg/dL — ABNORMAL HIGH (ref 0.61–1.24)
Glucose, Bld: 92 mg/dL (ref 70–99)
HCT: 38 % — ABNORMAL LOW (ref 39.0–52.0)
Hemoglobin: 12.9 g/dL — ABNORMAL LOW (ref 13.0–17.0)
Potassium: 4.3 mmol/L (ref 3.5–5.1)
Sodium: 141 mmol/L (ref 135–145)
TCO2: 19 mmol/L — ABNORMAL LOW (ref 22–32)

## 2021-02-08 SURGERY — ARTERIOVENOUS (AV) FISTULA CREATION
Anesthesia: General | Site: Arm Lower | Laterality: Right

## 2021-02-08 MED ORDER — PROPOFOL 10 MG/ML IV BOLUS
INTRAVENOUS | Status: DC | PRN
  Administered 2021-02-08: 20 mg via INTRAVENOUS
  Administered 2021-02-08: 40 mg via INTRAVENOUS
  Administered 2021-02-08: 10 mg via INTRAVENOUS

## 2021-02-08 MED ORDER — LIDOCAINE HCL (PF) 2 % IJ SOLN
INTRAMUSCULAR | Status: AC
  Filled 2021-02-08: qty 5

## 2021-02-08 MED ORDER — PROPOFOL 10 MG/ML IV BOLUS
INTRAVENOUS | Status: AC
  Filled 2021-02-08: qty 40

## 2021-02-08 MED ORDER — CHLORHEXIDINE GLUCONATE 0.12 % MT SOLN: 15.0000 mL | Freq: Once | OROMUCOSAL | Status: DC

## 2021-02-08 MED ORDER — ONDANSETRON HCL 4 MG/2ML IJ SOLN: 4.0000 mg | Freq: Once | INTRAMUSCULAR | Status: DC | PRN

## 2021-02-08 MED ORDER — PHENYLEPHRINE HCL (PRESSORS) 10 MG/ML IV SOLN
INTRAVENOUS | Status: DC | PRN
  Administered 2021-02-08 (×3): 80 ug via INTRAVENOUS

## 2021-02-08 MED ORDER — MIDAZOLAM HCL 2 MG/2ML IJ SOLN
INTRAMUSCULAR | Status: AC
  Filled 2021-02-08: qty 2

## 2021-02-08 MED ORDER — CEFAZOLIN SODIUM-DEXTROSE 2-4 GM/100ML-% IV SOLN
2.0000 g | INTRAVENOUS | Status: AC
  Administered 2021-02-08: 2 g via INTRAVENOUS

## 2021-02-08 MED ORDER — SODIUM CHLORIDE 0.9 % IV SOLN: INTRAVENOUS | Status: DC | PRN

## 2021-02-08 MED ORDER — HEPARIN SODIUM (PORCINE) 1000 UNIT/ML IJ SOLN
INTRAMUSCULAR | Status: AC
  Filled 2021-02-08: qty 6

## 2021-02-08 MED ORDER — LIDOCAINE-EPINEPHRINE 0.5 %-1:200000 IJ SOLN
INTRAMUSCULAR | Status: AC
  Filled 2021-02-08: qty 1

## 2021-02-08 MED ORDER — MIDAZOLAM HCL 2 MG/2ML IJ SOLN
INTRAMUSCULAR | Status: DC | PRN
  Administered 2021-02-08: 1 mg via INTRAVENOUS

## 2021-02-08 MED ORDER — FENTANYL CITRATE (PF) 100 MCG/2ML IJ SOLN
INTRAMUSCULAR | Status: AC
  Filled 2021-02-08: qty 2

## 2021-02-08 MED ORDER — 0.9 % SODIUM CHLORIDE (POUR BTL) OPTIME
TOPICAL | Status: DC | PRN
  Administered 2021-02-08: 1000 mL

## 2021-02-08 MED ORDER — CEFAZOLIN SODIUM-DEXTROSE 2-4 GM/100ML-% IV SOLN
INTRAVENOUS | Status: AC
  Filled 2021-02-08: qty 100

## 2021-02-08 MED ORDER — PHENYLEPHRINE 40 MCG/ML (10ML) SYRINGE FOR IV PUSH (FOR BLOOD PRESSURE SUPPORT)
PREFILLED_SYRINGE | INTRAVENOUS | Status: AC
  Filled 2021-02-08: qty 10

## 2021-02-08 MED ORDER — LIDOCAINE HCL (CARDIAC) PF 100 MG/5ML IV SOSY
PREFILLED_SYRINGE | INTRAVENOUS | Status: DC | PRN
  Administered 2021-02-08: 60 mg via INTRATRACHEAL

## 2021-02-08 MED ORDER — PROPOFOL 500 MG/50ML IV EMUL
INTRAVENOUS | Status: DC | PRN
  Administered 2021-02-08: 25 ug/kg/min via INTRAVENOUS

## 2021-02-08 MED ORDER — OXYCODONE-ACETAMINOPHEN 5-325 MG PO TABS: 1.0000 | ORAL_TABLET | Freq: Four times a day (QID) | ORAL | 0 refills | Status: DC | PRN

## 2021-02-08 MED ORDER — LIDOCAINE-EPINEPHRINE 0.5 %-1:200000 IJ SOLN
INTRAMUSCULAR | Status: DC | PRN
  Administered 2021-02-08: 2 mL

## 2021-02-08 MED ORDER — SEVOFLURANE IN SOLN
RESPIRATORY_TRACT | Status: AC
  Filled 2021-02-08: qty 250

## 2021-02-08 MED ORDER — ORAL CARE MOUTH RINSE: 15.0000 mL | Freq: Once | OROMUCOSAL | Status: DC

## 2021-02-08 MED ORDER — LACTATED RINGERS IV SOLN: INTRAVENOUS | Status: DC

## 2021-02-08 MED ORDER — FENTANYL CITRATE (PF) 100 MCG/2ML IJ SOLN: 25.0000 ug | INTRAMUSCULAR | Status: DC | PRN

## 2021-02-08 MED ORDER — FENTANYL CITRATE (PF) 100 MCG/2ML IJ SOLN
INTRAMUSCULAR | Status: DC | PRN
  Administered 2021-02-08: 25 ug via INTRAVENOUS
  Administered 2021-02-08: 50 ug via INTRAVENOUS
  Administered 2021-02-08: 25 ug via INTRAVENOUS

## 2021-02-08 MED ORDER — LACTATED RINGERS IV SOLN: INTRAVENOUS | Status: DC | PRN

## 2021-02-08 SURGICAL SUPPLY — 41 items
ADH SKN CLS APL DERMABOND .7 (GAUZE/BANDAGES/DRESSINGS) ×1
ARMBAND PINK RESTRICT EXTREMIT (MISCELLANEOUS) ×2 IMPLANT
BAG HAMPER (MISCELLANEOUS) ×2 IMPLANT
CANNULA VESSEL 3MM 2 BLNT TIP (CANNULA) ×2 IMPLANT
CLIP LIGATING EXTRA MED SLVR (CLIP) ×2 IMPLANT
CLIP LIGATING EXTRA SM BLUE (MISCELLANEOUS) ×2 IMPLANT
COVER LIGHT HANDLE STERIS (MISCELLANEOUS) ×4 IMPLANT
COVER MAYO STAND XLG (MISCELLANEOUS) ×2 IMPLANT
COVER PROBE W GEL 5X96 (DRAPES) ×2 IMPLANT
COVER WAND RF STERILE (DRAPES) ×2 IMPLANT
DECANTER SPIKE VIAL GLASS SM (MISCELLANEOUS) ×2 IMPLANT
DERMABOND ADVANCED (GAUZE/BANDAGES/DRESSINGS) ×1
DERMABOND ADVANCED .7 DNX12 (GAUZE/BANDAGES/DRESSINGS) ×1 IMPLANT
DRAPE HALF SHEET 40X57 (DRAPES) ×2 IMPLANT
ELECT REM PT RETURN 9FT ADLT (ELECTROSURGICAL) ×2
ELECTRODE REM PT RTRN 9FT ADLT (ELECTROSURGICAL) ×1 IMPLANT
GAUZE SPONGE 4X4 12PLY STRL (GAUZE/BANDAGES/DRESSINGS) ×2 IMPLANT
GLOVE SS BIOGEL STRL SZ 7.5 (GLOVE) ×1 IMPLANT
GLOVE SUPERSENSE BIOGEL SZ 7.5 (GLOVE) ×1
GLOVE SURG UNDER POLY LF SZ7 (GLOVE) ×6 IMPLANT
GOWN STRL REUS W/TWL LRG LVL3 (GOWN DISPOSABLE) ×6 IMPLANT
IV NS 500ML (IV SOLUTION) ×2
IV NS 500ML BAXH (IV SOLUTION) ×1 IMPLANT
KIT BLADEGUARD II DBL (SET/KITS/TRAYS/PACK) ×4 IMPLANT
KIT TURNOVER KIT A (KITS) ×2 IMPLANT
MANIFOLD NEPTUNE II (INSTRUMENTS) ×2 IMPLANT
MARKER SKIN DUAL TIP RULER LAB (MISCELLANEOUS) ×4 IMPLANT
NEEDLE HYPO 18GX1.5 BLUNT FILL (NEEDLE) ×2 IMPLANT
NS IRRIG 1000ML POUR BTL (IV SOLUTION) ×2 IMPLANT
PACK CV ACCESS (CUSTOM PROCEDURE TRAY) ×2 IMPLANT
PAD ARMBOARD 7.5X6 YLW CONV (MISCELLANEOUS) ×4 IMPLANT
SET BASIN LINEN APH (SET/KITS/TRAYS/PACK) ×2 IMPLANT
SOL PREP POV-IOD 4OZ 10% (MISCELLANEOUS) ×2 IMPLANT
SOL PREP PROV IODINE SCRUB 4OZ (MISCELLANEOUS) ×2 IMPLANT
SUT PROLENE 6 0 CC (SUTURE) ×2 IMPLANT
SUT SILK 2 0 SH (SUTURE) IMPLANT
SUT VIC AB 3-0 SH 27 (SUTURE) ×2
SUT VIC AB 3-0 SH 27X BRD (SUTURE) ×1 IMPLANT
SYR 10ML LL (SYRINGE) ×2 IMPLANT
SYR CONTROL 10ML LL (SYRINGE) ×2 IMPLANT
UNDERPAD 30X36 HEAVY ABSORB (UNDERPADS AND DIAPERS) ×2 IMPLANT

## 2021-02-08 NOTE — Anesthesia Preprocedure Evaluation (Signed)
Anesthesia Evaluation  Patient identified by MRN, date of birth, ID band Patient awake    Reviewed: Allergy & Precautions, H&P , NPO status , Patient's Chart, lab work & pertinent test results, reviewed documented beta blocker date and time   Airway Mallampati: II  TM Distance: >3 FB Neck ROM: full    Dental no notable dental hx.    Pulmonary COPD,  COPD inhaler, former smoker,    Pulmonary exam normal breath sounds clear to auscultation       Cardiovascular Exercise Tolerance: Good hypertension, negative cardio ROS   Rhythm:regular Rate:Normal     Neuro/Psych CVA negative psych ROS   GI/Hepatic Neg liver ROS, GERD  Medicated,  Endo/Other  negative endocrine ROS  Renal/GU CRFRenal disease  negative genitourinary   Musculoskeletal   Abdominal   Peds  Hematology negative hematology ROS (+)   Anesthesia Other Findings   Reproductive/Obstetrics negative OB ROS                             Anesthesia Physical Anesthesia Plan  ASA: III  Anesthesia Plan: General   Post-op Pain Management:    Induction:   PONV Risk Score and Plan:   Airway Management Planned:   Additional Equipment:   Intra-op Plan:   Post-operative Plan:   Informed Consent: I have reviewed the patients History and Physical, chart, labs and discussed the procedure including the risks, benefits and alternatives for the proposed anesthesia with the patient or authorized representative who has indicated his/her understanding and acceptance.     Dental Advisory Given  Plan Discussed with: CRNA  Anesthesia Plan Comments:         Anesthesia Quick Evaluation

## 2021-02-08 NOTE — Discharge Instructions (Signed)
[facility]  Discharge Instructions  AV Fistula or Graft Surgery for Dialysis Access  Please refer to the following instructions for your post-procedure care. Your surgeon or physician assistant will discuss any changes with you.  Activity  You may drive the day following your surgery, if you are comfortable and no longer taking prescription pain medication. Resume full activity as the soreness in your incision resolves.  Bathing/Showering  You may shower after you go home. Keep your incision dry for 48 hours. Do not soak in a bathtub, hot tub, or swim until the incision heals completely. You may not shower if you have a hemodialysis catheter.  Incision Care  Clean your incision with mild soap and water after 48 hours. Pat the area dry with a clean towel. You do not need a bandage unless otherwise instructed. Do not apply any ointments or creams to your incision. You may have skin glue on your incision. Do not peel it off. It will come off on its own in about one week. Your arm may swell a bit after surgery. To reduce swelling use pillows to elevate your arm so it is above your heart. Your doctor will tell you if you need to lightly wrap your arm with an ACE bandage.  Diet  Resume your normal diet. There are not special food restrictions following this procedure. In order to heal from your surgery, it is CRITICAL to get adequate nutrition. Your body requires vitamins, minerals, and protein. Vegetables are the best source of vitamins and minerals. Vegetables also provide the perfect balance of protein. Processed food has little nutritional value, so try to avoid this.  Medications  Resume taking all of your medications. If your incision is causing pain, you may take over-the counter pain relievers such as acetaminophen (Tylenol). If you were prescribed a stronger pain medication, please be aware these medications can cause nausea and constipation. Prevent  nausea by taking the medication with a snack or meal. Avoid constipation by drinking plenty of fluids and eating foods with high amount of fiber, such as fruits, vegetables, and grains.  Do not take Tylenol if you are taking prescription pain medications.  Follow up Your surgeon may want to see you in the office following your access surgery. If so, this will be arranged at the time of your surgery.  Please call us immediately for any of the following conditions:  . Increased pain, redness, drainage (pus) from your incision site . Fever of 101 degrees or higher . Severe or worsening pain at your incision site . Hand pain or numbness. .  Reduce your risk of vascular disease:  . Stop smoking. If you would like help, call [facility] at 1-800-QUIT-NOW (561)682-2799) or [place] at 574-320-0243  . Manage your cholesterol . Maintain a desired weight . Control your diabetes . Keep your blood pressure down  Dialysis  It will take several weeks to several months for your new dialysis access to be ready for use. Your surgeon will determine when it is okay to use it. Your nephrologist will continue to direct your dialysis. You can continue to use your Permcath until your new access is ready for use.   02/08/2021 Ernest Barnett [id number] 1956/01/19  Surgeon(s): Early, Arvilla Meres, MD  Procedure(s): RIGHT ARM ARTERIOVENOUS (AV) FISTULA CREATION   May stick graft immediately   May stick graft on designated area only:   x Do not stick fistula for 12 weeks    If  you have any questions, please call the office at [phone].   Monitored Anesthesia Care, Care After This sheet gives you information about how to care for yourself after your procedure. Your health care provider may also give you more specific instructions. If you have problems or questions, contact your health care provider. What can I expect after the procedure? After the procedure, it is common to  have:  Tiredness.  Forgetfulness about what happened after the procedure.  Impaired judgment for important decisions.  Nausea or vomiting.  Some difficulty with balance. Follow these instructions at home: For the time period you were told by your health care provider:  Rest as needed.  Do not participate in activities where you could fall or become injured.  Do not drive or use machinery.  Do not drink alcohol.  Do not take sleeping pills or medicines that cause drowsiness.  Do not make important decisions or sign legal documents.  Do not take care of children on your own.      Eating and drinking  Follow the diet that is recommended by your health care provider.  Drink enough fluid to keep your urine pale yellow.  If you vomit: ? Drink water, juice, or soup when you can drink without vomiting. ? Make sure you have little or no nausea before eating solid foods. General instructions  Have a responsible adult stay with you for the time you are told. It is important to have someone help care for you until you are awake and alert.  Take over-the-counter and prescription medicines only as told by your health care provider.  If you have sleep apnea, surgery and certain medicines can increase your risk for breathing problems. Follow instructions from your health care provider about wearing your sleep device: ? Anytime you are sleeping, including during daytime naps. ? While taking prescription pain medicines, sleeping medicines, or medicines that make you drowsy.  Avoid smoking.  Keep all follow-up visits as told by your health care provider. This is important. Contact a health care provider if:  You keep feeling nauseous or you keep vomiting.  You feel light-headed.  You are still sleepy or having trouble with balance after 24 hours.  You develop a rash.  You have a fever.  You have redness or swelling around the IV site. Get help right away if:  You have  trouble breathing.  You have new-onset confusion at home. Summary  For several hours after your procedure, you may feel tired. You may also be forgetful and have poor judgment.  Have a responsible adult stay with you for the time you are told. It is important to have someone help care for you until you are awake and alert.  Rest as told. Do not drive or operate machinery. Do not drink alcohol or take sleeping pills.  Get help right away if you have trouble breathing, or if you suddenly become confused. This information is not intended to replace advice given to you by your health care provider. Make sure you discuss any questions you have with your health care provider. Document Revised: 07/06/2020 Document Reviewed: 09/23/2019 Elsevier Patient Education  2021 [employer].

## 2021-02-08 NOTE — Anesthesia Postprocedure Evaluation (Signed)
Anesthesia Post Note  Patient: Ernest Barnett  Procedure(s) Performed: RIGHT ARM ARTERIOVENOUS (AV) FISTULA CREATION (Right Arm Lower)  Patient location during evaluation: Phase II Anesthesia Type: General Level of consciousness: awake Pain management: pain level controlled Vital Signs Assessment: post-procedure vital signs reviewed and stable Respiratory status: spontaneous breathing and respiratory function stable Cardiovascular status: blood pressure returned to baseline and stable Postop Assessment: no headache and no apparent nausea or vomiting Anesthetic complications: no Comments: Late entry   No complications documented.   Last Vitals:  Vitals:   02/08/21 1130 02/08/21 1139  BP: (!) 135/94 (!) 149/88  Pulse: 85 74  Resp: (!) 30 13  Temp:  36.7 C  SpO2: 98% 96%    Last Pain:  Vitals:   02/08/21 1139  TempSrc: Oral  PainSc: 0-No pain                 Louann Sjogren

## 2021-02-08 NOTE — Transfer of Care (Signed)
Immediate Anesthesia Transfer of Care Note  Patient: Ernest Barnett  Procedure(s) Performed: RIGHT ARM ARTERIOVENOUS (AV) FISTULA CREATION (Right Arm Lower)  Patient Location: PACU  Anesthesia Type:General  Level of Consciousness: awake and alert   Airway & Oxygen Therapy: Patient Spontanous Breathing and Patient connected to nasal cannula oxygen  Post-op Assessment: Report given to RN and Post -op Vital signs reviewed and stable  Post vital signs: Reviewed and stable  Last Vitals:  Vitals Value Taken Time  BP    Temp    Pulse    Resp    SpO2      Last Pain: There were no vitals filed for this visit.       Complications: No complications documented.

## 2021-02-08 NOTE — Op Note (Signed)
    OPERATIVE REPORT  DATE OF SURGERY: 02/08/2021  PATIENT: Ernest Barnett, 65 y.o. male MRN: [id number]  DOB: 1955/11/27  PRE-OPERATIVE DIAGNOSIS: Chronic renal insufficiency  POST-OPERATIVE DIAGNOSIS:  Same  PROCEDURE: Right radiocephalic AV fistula creation  SURGEON:  Curt Jews, M.D.  PHYSICIAN ASSISTANT: Fulton Mole, RNFA  The assistant was needed for exposure and to expedite the case  ANESTHESIA: Local with sedation  EBL: per anesthesia record  Total I/O In: 450 [I.V.:350; IV Piggyback:100] Out: 2 [Blood:2]  BLOOD ADMINISTERED: none  DRAINS: none  SPECIMEN: none  COUNTS CORRECT:  YES  PATIENT DISPOSITION:  PACU - hemodynamically stable  PROCEDURE DETAILS: Patient was taken operating placed supine position with area of the right arm was prepped draped you sterile fashion.  SonoSite ultrasound confirmed preoperative vein mapping showed good size cephalic vein.  Incision was made using local anesthesia between the radial artery and the cephalic vein at the wrist.  The cephalic vein was mobilized tributary branches were ligated with 3-0 and 4-0 silk ties and divided.  The vein was ligated distally and divided and was mobilized to the level of the radial artery.  The radial artery was split in the same incision.  It was of good caliber.  The artery was occluded proximally and distally with Serafin clamps and the artery was opened with an 11 blade and extended longitudinally with Potts scissors.  The vein was cut to the appropriate length and was spatulated and sewn end-to-side to the artery with a running 6-0 Prolene suture.  Clamps were removed and excellent thrill was noted.  The wounds irrigated with saline.  Hemostasis obtained after cautery.  Wounds were closed with 3-0 Vicryl in the subcutaneous and subcuticular tissue.  Sterile dressing was applied the patient was transferred to the recovery room in stable condition   [name], M.D., [facility] 02/08/2021 11:07  AM  Note: Portions of this report may have been transcribed using voice recognition software.  Every effort has been made to ensure accuracy; however, inadvertent computerized transcription errors may still be present.

## 2021-02-08 NOTE — Interval H&P Note (Signed)
History and Physical Interval Note:  02/08/2021 9:41 AM  Ernest Barnett  has presented today for surgery, with the diagnosis of CKD STAGE V.  The various methods of treatment have been discussed with the patient and family. After consideration of risks, benefits and other options for treatment, the patient has consented to  Procedure(s): RIGHT ARM ARTERIOVENOUS (AV) FISTULA CREATION (Right) as a surgical intervention.  The patient's history has been reviewed, patient examined, no change in status, stable for surgery.  I have reviewed the patient's chart and labs.  Questions were answered to the patient's satisfaction.     Curt Jews

## 2021-02-09 ENCOUNTER — Encounter (HOSPITAL_COMMUNITY): Payer: Self-pay | Admitting: Vascular Surgery

## 2021-02-13 DIAGNOSIS — N189 Chronic kidney disease, unspecified: Secondary | ICD-10-CM | POA: Diagnosis not present

## 2021-02-13 DIAGNOSIS — E211 Secondary hyperparathyroidism, not elsewhere classified: Secondary | ICD-10-CM | POA: Diagnosis not present

## 2021-02-13 DIAGNOSIS — N281 Cyst of kidney, acquired: Secondary | ICD-10-CM | POA: Diagnosis not present

## 2021-02-13 DIAGNOSIS — D472 Monoclonal gammopathy: Secondary | ICD-10-CM | POA: Diagnosis not present

## 2021-02-13 DIAGNOSIS — E872 Acidosis: Secondary | ICD-10-CM | POA: Diagnosis not present

## 2021-02-13 DIAGNOSIS — N185 Chronic kidney disease, stage 5: Secondary | ICD-10-CM | POA: Diagnosis not present

## 2021-02-13 DIAGNOSIS — I120 Hypertensive chronic kidney disease with stage 5 chronic kidney disease or end stage renal disease: Secondary | ICD-10-CM | POA: Diagnosis not present

## 2021-02-13 DIAGNOSIS — D631 Anemia in chronic kidney disease: Secondary | ICD-10-CM | POA: Diagnosis not present

## 2021-02-13 DIAGNOSIS — R809 Proteinuria, unspecified: Secondary | ICD-10-CM | POA: Diagnosis not present

## 2021-02-13 LAB — SURGICAL PATHOLOGY

## 2021-02-15 ENCOUNTER — Encounter (HOSPITAL_COMMUNITY): Payer: Self-pay

## 2021-02-15 ENCOUNTER — Other Ambulatory Visit (HOSPITAL_COMMUNITY): Payer: Self-pay

## 2021-02-15 ENCOUNTER — Ambulatory Visit (HOSPITAL_COMMUNITY)
Admission: RE | Admit: 2021-02-15 | Discharge: 2021-02-15 | Disposition: A | Discharge status: Home / Self Care | Payer: Medicare HMO | Source: Ambulatory Visit | Attending: Hematology | Admitting: Hematology

## 2021-02-15 ENCOUNTER — Other Ambulatory Visit: Payer: Self-pay

## 2021-02-15 ENCOUNTER — Other Ambulatory Visit (HOSPITAL_COMMUNITY): Payer: Self-pay | Admitting: Hematology

## 2021-02-15 DIAGNOSIS — I120 Hypertensive chronic kidney disease with stage 5 chronic kidney disease or end stage renal disease: Secondary | ICD-10-CM | POA: Diagnosis not present

## 2021-02-15 DIAGNOSIS — Z87891 Personal history of nicotine dependence: Secondary | ICD-10-CM | POA: Insufficient documentation

## 2021-02-15 DIAGNOSIS — E872 Acidosis: Secondary | ICD-10-CM | POA: Diagnosis not present

## 2021-02-15 DIAGNOSIS — N189 Chronic kidney disease, unspecified: Secondary | ICD-10-CM | POA: Diagnosis not present

## 2021-02-15 DIAGNOSIS — D472 Monoclonal gammopathy: Secondary | ICD-10-CM

## 2021-02-15 DIAGNOSIS — R809 Proteinuria, unspecified: Secondary | ICD-10-CM | POA: Diagnosis not present

## 2021-02-15 DIAGNOSIS — N08 Glomerular disorders in diseases classified elsewhere: Secondary | ICD-10-CM | POA: Diagnosis not present

## 2021-02-15 DIAGNOSIS — C9000 Multiple myeloma not having achieved remission: Secondary | ICD-10-CM | POA: Diagnosis not present

## 2021-02-15 DIAGNOSIS — Z122 Encounter for screening for malignant neoplasm of respiratory organs: Secondary | ICD-10-CM | POA: Diagnosis not present

## 2021-02-15 DIAGNOSIS — D631 Anemia in chronic kidney disease: Secondary | ICD-10-CM | POA: Diagnosis not present

## 2021-02-15 DIAGNOSIS — N185 Chronic kidney disease, stage 5: Secondary | ICD-10-CM | POA: Diagnosis not present

## 2021-02-15 DIAGNOSIS — E211 Secondary hyperparathyroidism, not elsewhere classified: Secondary | ICD-10-CM | POA: Diagnosis not present

## 2021-02-15 NOTE — Progress Notes (Signed)
This patient was evaluated for MGUS in our office at the request of Dr. [name].  He is found to have M spike of 2 g/dL.  Kappa light chains were elevated at 794 with a ratio of 49.97. Dr. [name] has called me and informed that his kidney biopsy was positive for deposition of kappa light chains consistent with myeloma involvement.  I have called and talked to him and informed him about the findings.  He will need bone marrow aspiration and biopsy and a whole-body PET CT scan for staging of multiple myeloma.  Bone marrow biopsy will be scheduled on 02/21/2021 at 7:30 AM.  PET scan will be scheduled on 02/25/2021.

## 2021-02-15 NOTE — Progress Notes (Signed)
Order for PET scan placed per Dr. Delton Coombes. Patient to be scheduled for PET scan and BMBx per Dr. Delton Coombes this will be discussed at next visit.

## 2021-02-19 ENCOUNTER — Encounter (HOSPITAL_COMMUNITY): Payer: Self-pay

## 2021-02-19 NOTE — Progress Notes (Signed)
Patient notified of LDCT Lung Cancer Screening Results via mail with the recommendation to follow-up in 12 months. Patient's referring provider has been sent a copy of results. Results are as follows:  IMPRESSION: 1. Lung-RADS 1, negative. Continue annual screening with low-dose chest CT without contrast in 12 months. 2. Aortic atherosclerosis (ICD10-I70.0), coronary artery atherosclerosis and emphysema (ICD10-J43.9). 3. Peripheral predominant areas of interstitial thickening and mild architectural distortion, likely post infectious or inflammatory scarring. Sequelae of prior COVID-19 pneumonia could have this appearance. 4. Mild distal esophageal wall thickening, suggesting esophagitis. The patient has had prior endoscopies and esophageal dilatations. 5. Apparent asymmetry involving the left side of the larynx on the first image of the exam. Correlate with any symptoms of hoarseness to suggest laryngeal pathology.

## 2021-02-20 ENCOUNTER — Encounter (HOSPITAL_COMMUNITY): Payer: Self-pay

## 2021-02-21 ENCOUNTER — Other Ambulatory Visit (HOSPITAL_COMMUNITY)
Admission: RE | Admit: 2021-02-21 | Discharge: 2021-02-21 | Disposition: A | Discharge status: Home / Self Care | Payer: Medicare HMO | Source: Ambulatory Visit | Attending: Hematology | Admitting: Hematology

## 2021-02-21 ENCOUNTER — Inpatient Hospital Stay (HOSPITAL_COMMUNITY): Payer: Medicare HMO | Attending: Hematology | Admitting: Hematology

## 2021-02-21 ENCOUNTER — Other Ambulatory Visit: Payer: Self-pay

## 2021-02-21 ENCOUNTER — Encounter (HOSPITAL_COMMUNITY): Payer: Self-pay

## 2021-02-21 VITALS — BP 148/97 | HR 89 | Temp 97.2°F | Resp 17

## 2021-02-21 DIAGNOSIS — Z87891 Personal history of nicotine dependence: Secondary | ICD-10-CM | POA: Diagnosis not present

## 2021-02-21 DIAGNOSIS — Z122 Encounter for screening for malignant neoplasm of respiratory organs: Secondary | ICD-10-CM

## 2021-02-21 DIAGNOSIS — Z801 Family history of malignant neoplasm of trachea, bronchus and lung: Secondary | ICD-10-CM | POA: Insufficient documentation

## 2021-02-21 DIAGNOSIS — D472 Monoclonal gammopathy: Secondary | ICD-10-CM | POA: Diagnosis not present

## 2021-02-21 DIAGNOSIS — F17200 Nicotine dependence, unspecified, uncomplicated: Secondary | ICD-10-CM | POA: Insufficient documentation

## 2021-02-21 DIAGNOSIS — D631 Anemia in chronic kidney disease: Secondary | ICD-10-CM | POA: Insufficient documentation

## 2021-02-21 DIAGNOSIS — D47Z9 Other specified neoplasms of uncertain behavior of lymphoid, hematopoietic and related tissue: Secondary | ICD-10-CM | POA: Diagnosis not present

## 2021-02-21 DIAGNOSIS — D72820 Lymphocytosis (symptomatic): Secondary | ICD-10-CM | POA: Diagnosis not present

## 2021-02-21 DIAGNOSIS — D72829 Elevated white blood cell count, unspecified: Secondary | ICD-10-CM | POA: Diagnosis not present

## 2021-02-21 DIAGNOSIS — D6489 Other specified anemias: Secondary | ICD-10-CM | POA: Diagnosis not present

## 2021-02-21 DIAGNOSIS — I10 Essential (primary) hypertension: Secondary | ICD-10-CM | POA: Insufficient documentation

## 2021-02-21 DIAGNOSIS — H9190 Unspecified hearing loss, unspecified ear: Secondary | ICD-10-CM | POA: Diagnosis not present

## 2021-02-21 DIAGNOSIS — R5383 Other fatigue: Secondary | ICD-10-CM | POA: Insufficient documentation

## 2021-02-21 DIAGNOSIS — N184 Chronic kidney disease, stage 4 (severe): Secondary | ICD-10-CM | POA: Insufficient documentation

## 2021-02-21 DIAGNOSIS — R69 Illness, unspecified: Secondary | ICD-10-CM | POA: Diagnosis not present

## 2021-02-21 LAB — CBC WITH DIFFERENTIAL/PLATELET
Abs Immature Granulocytes: 0.05 10*3/uL (ref 0.00–0.07)
Basophils Absolute: 0.1 10*3/uL (ref 0.0–0.1)
Basophils Relative: 1 %
Eosinophils Absolute: 0.3 10*3/uL (ref 0.0–0.5)
Eosinophils Relative: 3 %
HCT: 36 % — ABNORMAL LOW (ref 39.0–52.0)
Hemoglobin: 11.7 g/dL — ABNORMAL LOW (ref 13.0–17.0)
Immature Granulocytes: 0 %
Lymphocytes Relative: 52 %
Lymphs Abs: 6.3 10*3/uL — ABNORMAL HIGH (ref 0.7–4.0)
MCH: 31.7 pg (ref 26.0–34.0)
MCHC: 32.5 g/dL (ref 30.0–36.0)
MCV: 97.6 fL (ref 80.0–100.0)
Monocytes Absolute: 1 10*3/uL (ref 0.1–1.0)
Monocytes Relative: 9 %
Neutro Abs: 4.1 10*3/uL (ref 1.7–7.7)
Neutrophils Relative %: 35 %
Platelets: 211 10*3/uL (ref 150–400)
RBC: 3.69 MIL/uL — ABNORMAL LOW (ref 4.22–5.81)
RDW: 15.2 % (ref 11.5–15.5)
WBC Morphology: >20
WBC: 11.9 10*3/uL — ABNORMAL HIGH (ref 4.0–10.5)
nRBC: 0 % (ref 0.0–0.2)

## 2021-02-21 NOTE — Progress Notes (Signed)
Patient here today for bone marrow biopsy. Procedure explained and consent signed by all parties at 736. Patient placed in prone position with both arms above head. Time out conducted at 745 and team agreed. Procedure started at 750. Patient tolerated procedure well with minimal pain and discomfort. Specimens collected and labeled appropriately. Procedure completed at 802. Dressing applied and patient reposition on back, sitting up, resting at 807. Specimens taken to lab for processing. Patient stable during procedure and discharged home ambulatory with family member and  in stable condition at 38.

## 2021-02-23 LAB — SURGICAL PATHOLOGY

## 2021-02-26 ENCOUNTER — Ambulatory Visit (HOSPITAL_COMMUNITY): Payer: Medicare HMO | Admitting: Hematology

## 2021-02-26 ENCOUNTER — Other Ambulatory Visit (HOSPITAL_COMMUNITY): Payer: Medicare HMO

## 2021-02-27 ENCOUNTER — Encounter (HOSPITAL_COMMUNITY): Payer: Self-pay | Admitting: Hematology

## 2021-02-27 DIAGNOSIS — K22719 Barrett's esophagus with dysplasia, unspecified: Secondary | ICD-10-CM | POA: Diagnosis not present

## 2021-02-27 DIAGNOSIS — E782 Mixed hyperlipidemia: Secondary | ICD-10-CM | POA: Diagnosis not present

## 2021-02-27 DIAGNOSIS — H33019 Retinal detachment with single break, unspecified eye: Secondary | ICD-10-CM | POA: Diagnosis not present

## 2021-02-27 DIAGNOSIS — Z8673 Personal history of transient ischemic attack (TIA), and cerebral infarction without residual deficits: Secondary | ICD-10-CM | POA: Diagnosis not present

## 2021-02-27 DIAGNOSIS — Q393 Congenital stenosis and stricture of esophagus: Secondary | ICD-10-CM | POA: Diagnosis not present

## 2021-02-27 DIAGNOSIS — N185 Chronic kidney disease, stage 5: Secondary | ICD-10-CM | POA: Diagnosis not present

## 2021-02-27 DIAGNOSIS — I10 Essential (primary) hypertension: Secondary | ICD-10-CM | POA: Diagnosis not present

## 2021-02-27 DIAGNOSIS — Z1289 Encounter for screening for malignant neoplasm of other sites: Secondary | ICD-10-CM | POA: Diagnosis not present

## 2021-02-27 DIAGNOSIS — J449 Chronic obstructive pulmonary disease, unspecified: Secondary | ICD-10-CM | POA: Diagnosis not present

## 2021-02-27 DIAGNOSIS — Z0189 Encounter for other specified special examinations: Secondary | ICD-10-CM | POA: Diagnosis not present

## 2021-02-28 ENCOUNTER — Encounter (HOSPITAL_COMMUNITY): Payer: Medicare HMO | Admitting: Hematology

## 2021-03-02 NOTE — Progress Notes (Signed)
INDICATION: MGUS, to evaluate for myeloma.   Bone Marrow Biopsy and Aspiration Procedure Note   The patient was identified by name and date of birth, prior to start of the procedure and a timeout was performed.   An informed consent was obtained after discussing potential risks including bleeding, infection and pain.  The left posterior iliac crest was palpated, cleaned with ChloraPrep, and drapes applied.  1% lidocaine is infiltrated into the skin, subcutaneous tissue and periosteum.  Bone marrow was aspirated and smears made.  With the help of Jamshidi needle a core biopsy was obtained.  Pressure was applied to the biopsy site and bandage was placed over the biopsy site. Patient was made to lie on the back for 15 mins prior to discharge.  The procedure was tolerated well. COMPLICATIONS: None BLOOD LOSS: none Patient was discharged home in stable condition to return in 2 weeks to review results.  Patient was provided with post bone marrow biopsy instructions and instructed to call if there was any bleeding or worsening pain.  Specimens sent for flow cytometry, cytogenetics and additional studies.  Signed Derek Jack, MD

## 2021-03-02 NOTE — Progress Notes (Signed)
[place] [address], [place] [address]   CLINIC:  Medical Oncology/Hematology  Patient Care Team: Neale Burly, MD as PCP - General (Unknown Physician Specialty) Brien Mates, RN as Oncology Nurse Navigator (Oncology)  CHIEF COMPLAINTS/PURPOSE OF CONSULTATION:   MGUS  HISTORY OF PRESENTING ILLNESS:  Ernest Barnett 65 y.o. male is seen today for follow-up of MGUS at the request of Dr. [name].  He denies any new onset bone pains.  Baseline tiredness is stable.  No fevers, night sweats or weight loss.  No recurrent infections.   MEDICAL HISTORY:  Past Medical History:  Diagnosis Date  . Chronic kidney disease   . COPD (chronic obstructive pulmonary disease) ([place])   . CVA (cerebral vascular accident) ([employer]) 2013  . GERD (gastroesophageal reflux disease)   . HOH (hard of hearing)   . Hypercholesteremia   . Hypertension   . Stroke ([place]) 03/24/2012   left sided weakness  . Vitamin D deficiency     SURGICAL HISTORY: Past Surgical History:  Procedure Laterality Date  . AV FISTULA PLACEMENT Right 02/08/2021   Procedure: RIGHT ARM ARTERIOVENOUS (AV) FISTULA CREATION;  Surgeon: [name], MD;  Location: [facility];  Service: Vascular;  Laterality: Right;  . BIOPSY  11/12/2018   Procedure: BIOPSY;  Surgeon: Rogene Houston, MD;  Location: [facility];  Service: Endoscopy;;  colon  . BIOPSY  05/10/2020   Procedure: BIOPSY;  Surgeon: Rogene Houston, MD;  Location: [facility];  Service: Endoscopy;;  esophagus  . CATARACT EXTRACTION     right eye  . CATARACT EXTRACTION W/PHACO  10/08/2012   Procedure: CATARACT EXTRACTION PHACO AND INTRAOCULAR LENS PLACEMENT (IOC);  Surgeon: Tonny Branch, MD;  Location: [facility];  Service: Ophthalmology;  Laterality: Left;  CDE:6.64  . CHOLECYSTECTOMY     MMH  . COLONOSCOPY N/A 11/12/2018   Procedure: COLONOSCOPY;  Surgeon: Rogene Houston, MD;  Location: [facility];  Service: Endoscopy;  Laterality: N/A;  1030  .  ELBOW FRACTURE SURGERY     left  . ESOPHAGEAL DILATION N/A 11/12/2018   Procedure: ESOPHAGEAL DILATION;  Surgeon: Rogene Houston, MD;  [facility];  Service: Endoscopy;  Laterality: N/A;  . ESOPHAGEAL DILATION N/A 05/10/2020   Procedure: ESOPHAGEAL DILATION;  Surgeon: Rogene Houston, MD;  Location[facility];  Service: Endoscopy;  Laterality: N/A;  . ESOPHAGOGASTRODUODENOSCOPY N/A 11/12/2018   Procedure: ESOPHAGOGASTRODUODENOSCOPY (EGD);  Surgeon: Rogene Houston, MD;  [facility];  Service: Endoscopy;  Laterality: N/A;  . ESOPHAGOGASTRODUODENOSCOPY N/A 05/10/2020   Procedure: ESOPHAGOGASTRODUODENOSCOPY (EGD);  Surgeon: Rogene Houston, MD;  [facility];  Service: Endoscopy;  Laterality: N/A;  210  . HERNIA REPAIR     right inguinal  . HYDROCELE EXCISION / REPAIR    . POLYPECTOMY  11/12/2018   Procedure: POLYPECTOMY;  Surgeon: Rogene Houston, MD;  [facility];  Service: Endoscopy;;  colon   . RETINAL DETACHMENT SURGERY Left 2019  . SPLENECTOMY, TOTAL    . TONSILLECTOMY    . VOCAL CORD INJECTION     removal of polyp-2005    SOCIAL HISTORY: Social History   Socioeconomic History  . Marital status: Significant Other    Spouse name: Not on file  . Number of children: Not on file  . Years of education: Not on file  . Highest education level: Not on file  Occupational History  . Not on  file  Tobacco Use  . Smoking status: Former Smoker    Packs/day: 2.00    Years: 40.00    Pack years: 80.00    Types: Cigarettes, E-cigarettes  . Smokeless tobacco: Current User  . Tobacco comment: Pt only vap now  Vaping Use  . Vaping Use: Every day  . Substances: Nicotine  Substance and Sexual Activity  . Alcohol use: Yes    Comment: occassional  . Drug use: No  . Sexual activity: Yes    Birth control/protection: None  Other Topics Concern  . Not on file  Social History Narrative  . Not on file   Social Determinants of Health    Financial Resource Strain: Low Risk   . Difficulty of Paying Living Expenses: Not hard at all  Food Insecurity: No Food Insecurity  . Worried About Charity fundraiser in the Last Year: Never true  . Ran Out of Food in the Last Year: Never true  Transportation Needs: No Transportation Needs  . Lack of Transportation (Medical): No  . Lack of Transportation (Non-Medical): No  Physical Activity: Insufficiently Active  . Days of Exercise per Week: 2 days  . Minutes of Exercise per Session: 20 min  Stress: No Stress Concern Present  . Feeling of Stress : Not at all  Social Connections: Moderately Isolated  . Frequency of Communication with Friends and Family: More than three times a week  . Frequency of Social Gatherings with Friends and Family: More than three times a week  . Attends Religious Services: 1 to 4 times per year  . Active Member of Clubs or Organizations: No  . Attends Archivist Meetings: Never  . Marital Status: Never married  Intimate Partner Violence: Not At Risk  . Fear of Current or Ex-Partner: No  . Emotionally Abused: No  . Physically Abused: No  . Sexually Abused: No    FAMILY HISTORY: No family history on file.  ALLERGIES:  has No Known Allergies.  MEDICATIONS:  Current Outpatient Medications  Medication Sig Dispense Refill  . amLODipine (NORVASC) 5 MG tablet Take 5 mg by mouth daily.    [name] aspirin EC 81 MG tablet Take 1 tablet (81 mg total) by mouth daily. 30 tablet 11  . calcitRIOL (ROCALTROL) 0.25 MCG capsule Take 0.25 mcg by mouth every Monday, Wednesday, and Friday.    . cholecalciferol (VITAMIN D3) 25 MCG (1000 UNIT) tablet Take 1,000 Units by mouth daily.    . Omega-3 Fatty Acids (FISH OIL) 1000 MG CAPS Take 1,000 mg by mouth in the morning, at noon, and at bedtime.     [name] oxyCODONE-acetaminophen (PERCOCET) 5-325 MG tablet Take 1 tablet by mouth every 6 (six) hours as needed for severe pain. 8 tablet 0  . pantoprazole (PROTONIX) 40 MG  tablet Take 1 tablet (40 mg total) by mouth 2 (two) times daily. 180 tablet 3  . polyethylene glycol (MIRALAX / GLYCOLAX) packet Take 17 g by mouth daily. Patient states that he takes as needed. (Patient taking differently: Take 17 g by mouth daily as needed for mild constipation.) 30 each 5  . pravastatin (PRAVACHOL) 40 MG tablet Take 40 mg by mouth daily.    . sodium bicarbonate 650 MG tablet Take 1,300 mg by mouth 3 (three) times daily.    . tamsulosin (FLOMAX) 0.4 MG CAPS capsule Take 0.4 mg by mouth daily.      No current facility-administered medications for this visit.    REVIEW OF SYSTEMS:  Review of Systems  Constitutional: Positive for fatigue (50%). Negative for appetite change, chills, diaphoresis, fever and unexpected weight change.  HENT:   Positive for hearing loss.   Musculoskeletal: Negative for arthralgias and myalgias.  Neurological: Negative for numbness.  All other systems reviewed and are negative.    PHYSICAL EXAMINATION: ECOG PERFORMANCE STATUS: 1 - Symptomatic but completely ambulatory  Vitals:   02/21/21 0736 02/21/21 0825  BP: 118/81 (!) 148/97  Pulse: 88 89  Resp: 15 17  Temp: 97.6 F (36.4 C) (!) 97.2 F (36.2 C)  SpO2: 96% 96%   There were no vitals filed for this visit. Physical Exam Vitals reviewed.  Constitutional:      Appearance: Normal appearance. He is obese.  Cardiovascular:     Rate and Rhythm: Normal rate and regular rhythm.     Heart sounds: Normal heart sounds.  Pulmonary:     Effort: Pulmonary effort is normal.     Breath sounds: Normal breath sounds.  Chest:  Breasts:     Right: No axillary adenopathy or supraclavicular adenopathy.     Left: No axillary adenopathy or supraclavicular adenopathy.    Abdominal:     Palpations: Abdomen is soft. There is no hepatomegaly, splenomegaly or mass.     Tenderness: There is no abdominal tenderness.     Hernia: No hernia is present.  Musculoskeletal:     Right lower leg: No edema.      Left lower leg: No edema.  Lymphadenopathy:     Cervical: No cervical adenopathy.     Upper Body:     Right upper body: No supraclavicular, axillary or pectoral adenopathy.     Left upper body: No supraclavicular, axillary or pectoral adenopathy.     Lower Body: No right inguinal adenopathy. No left inguinal adenopathy.  Neurological:     General: No focal deficit present.     Mental Status: He is alert and oriented to person, place, and time.  Psychiatric:        Mood and Affect: Mood normal.        Behavior: Behavior normal.      LABORATORY DATA:  I have reviewed the data as listed Recent Results (from the past 2160 hour(s))  CBC upon arrival     Status: Abnormal   Collection Time: 01/25/21  6:54 AM  Result Value Ref Range   WBC 11.2 (H) 4.0 - 10.5 K/uL   RBC 3.78 (L) 4.22 - 5.81 MIL/uL   Hemoglobin 11.9 (L) 13.0 - 17.0 g/dL   HCT 35.5 (L) 39.0 - 52.0 %   MCV 93.9 80.0 - 100.0 fL   MCH 31.5 26.0 - 34.0 pg   MCHC 33.5 30.0 - 36.0 g/dL   RDW 14.6 11.5 - 15.5 %   Platelets 227 150 - 400 K/uL   nRBC 0.0 0.0 - 0.2 %    Comment: Performed at [facility], [place] [address], [place], [place] [address]  Protime-INR upon arrival     Status: None   Collection Time: 01/25/21  6:54 AM  Result Value Ref Range   Prothrombin Time 13.6 11.4 - 15.2 seconds   INR 1.1 0.8 - 1.2    Comment: (NOTE) INR goal varies based on device and disease states. Performed at [facility], [place] [address], [place],  [address]   Surgical pathology     Status: None   Collection Time: 01/25/21  8:56 AM  Result Value Ref Range   SURGICAL PATHOLOGY  SURGICAL PATHOLOGY CASE: [id number] PATIENT: Timmey Bade Surgical Pathology Report     Clinical History: medial renal biopsy, proteinuria (cm)   FINAL MICROSCOPIC DIAGNOSIS:  A. KIDNEY, LEFT, NEEDLE CORE BIOPSY: - See OUTSIDE PATHOLOGY REPORT under RESULTS REVIEW or the LABS TAB under "[facility] KIDNEY BIOPSY SEND OUT" in  [facility].   GROSS DESCRIPTION:  Received in saline are 2 cores of tan-pink soft tissue, each 2 cm in length and 0.1 cm in diameter. One is submitted in formalin for light microscopy and possible EM studies, and remaining core is submitted in Michel's media for immunofluorescence studies.  The entire specimen is sent to the [facility], Department of Pathology at the [facility] of Medicine at [facility].  SW 01/26/2021    Final Diagnosis performed by Thressa Sheller, MD.   Electronically signed 02/13/2021 Technical and / or Professional components performed at [facility]. [place], [place] [address], [place], [place]  [address].  Immunohistochemistry Technical component (if applicable) was performed at [facility]. [address] [place], [place], [place] [address].   IMMUNOHISTOCHEMISTRY DISCLAIMER (if applicable): Some of these immunohistochemical stains may have been developed and the performance characteristics determine by [facility] Some may not have been cleared or approved by the U.S. Food and Drug Administration. The FDA has determined that such clearance or approval is not necessary. This test is used for clinical purposes. It should not be regarded as investigational or for research. This laboratory is certified under the [place] (CLIA-88) as qualified to perform high complexity clinical laboratory testing.  The controls stained appropriately.   CBC with Differential     Status: Abnormal   Collection Time: 02/01/21  9:23 AM  Result Value Ref Range   WBC 11.1 (H) 4.0 - 10.5 K/uL   RBC 3.90 (L) 4.22 - 5.81 MIL/uL   Hemoglobin 12.2 (L) 13.0 - 17.0 g/dL   HCT 37.6 (L) 39.0 - 52.0 %   MCV 96.4 80.0 - 100.0 fL   MCH 31.3 26.0 - 34.0 pg   MCHC 32.4 30.0 - 36.0 g/dL   RDW 14.9 11.5 - 15.5 %   Platelets 231 150 - 400 K/uL   nRBC 0.0 0.0 - 0.2 %   Neutrophils Relative % 44 %    Neutro Abs 4.9 1.7 - 7.7 K/uL   Lymphocytes Relative 43 %   Lymphs Abs 4.8 (H) 0.7 - 4.0 K/uL   Monocytes Relative 10 %   Monocytes Absolute 1.1 (H) 0.1 - 1.0 K/uL   Eosinophils Relative 2 %   Eosinophils Absolute 0.2 0.0 - 0.5 K/uL   Basophils Relative 1 %   Basophils Absolute 0.1 0.0 - 0.1 K/uL   Immature Granulocytes 0 %   Abs Immature Granulocytes 0.04 0.00 - 0.07 K/uL    Comment: Performed at [facility], [address] [place], [place] [address]  Comprehensive metabolic panel     Status: Abnormal   Collection Time: 02/01/21  9:23 AM  Result Value Ref Range   Sodium 138 135 - 145 mmol/L   Potassium 4.5 3.5 - 5.1 mmol/L   Chloride 109 98 - 111 mmol/L   CO2 20 (L) 22 - 32 mmol/L   Glucose, Bld 91 70 - 99 mg/dL    Comment: Glucose reference range applies only to samples taken after fasting for at least 8 hours.   BUN 31 (H) 8 - 23 mg/dL   Creatinine, Ser 4.81 (H) 0.61 - 1.24  mg/dL   Calcium 9.4 8.9 - 10.3 mg/dL   Total Protein 8.9 (H) 6.5 - 8.1 g/dL   Albumin 3.5 3.5 - 5.0 g/dL   AST 7 (L) 15 - 41 U/L   ALT 13 0 - 44 U/L   Alkaline Phosphatase 27 (L) 38 - 126 U/L   Total Bilirubin 0.6 0.3 - 1.2 mg/dL   GFR, Estimated 13 (L) >60 mL/min    Comment: (NOTE) Calculated using the CKD-EPI Creatinine Equation (2021)    Anion gap 9 5 - 15    Comment: Performed at [facility], [address] [place], [place] [address]  Lactate dehydrogenase     Status: None   Collection Time: 02/01/21  9:23 AM  Result Value Ref Range   LDH 130 98 - 192 U/L    Comment: Performed at [facility], [address] [place], [place] [address]  Beta 2 microglobulin, serum     Status: Abnormal   Collection Time: 02/01/21  9:23 AM  Result Value Ref Range   Beta-2 Microglobulin 5.5 (H) 0.6 - 2.4 mg/L    Comment: (NOTE) Siemens Immulite 2000 Immunochemiluminometric assay (ICMA) Values obtained with different assay methods or kits cannot be used interchangeably. Results cannot be interpreted as absolute  evidence of the presence or absence of malignant disease. Performed At: [facility] [place], [place] [id number] Rush Farmer MD JS:[id number]   Immunofixation electrophoresis     Status: Abnormal   Collection Time: 02/01/21  9:23 AM  Result Value Ref Range   Total Protein ELP 8.6 (H) 6.0 - 8.5 g/dL   IgG (Immunoglobin G), Serum 2,556 (H) 603 - 1,613 mg/dL   IgA 42 (L) 61 - 437 mg/dL    Comment: Result confirmed on concentration.   IgM (Immunoglobulin M), Srm 19 (L) 20 - 172 mg/dL    Comment: (NOTE) Result confirmed on concentration. Performed At: [facility] [place], [place] [id number] Rush Farmer MD RS:[id number]    Immunofixation Result, Serum Comment (A)     Comment: (NOTE) Immunofixation shows IgG monoclonal protein with kappa light chain specificity. Please note that samples from patients receiving DARZALEX(R) (daratumumab) or SARCLISA(R)(isatuximab-irfc) treatment can appear as an "IgG kappa" and mask a complete response (CR). If this patient is receiving these therapies, this IFE assay interference can be removed by ordering test number 123218-"Immunofixation, Daratumumab-Specific, Serum" or [id number]-"Immunofixation, Isatuximab-Specific, Serum" and submitting a new sample for testing or by calling the lab to add this test to the current sample.   Protein electrophoresis, serum     Status: Abnormal   Collection Time: 02/01/21  9:23 AM  Result Value Ref Range   Total Protein ELP 8.1 6.0 - 8.5 g/dL   Albumin ELP 4.0 2.9 - 4.4 g/dL   Alpha-1-Globulin 0.3 0.0 - 0.4 g/dL   Alpha-2-Globulin 0.7 0.4 - 1.0 g/dL   Beta Globulin 0.8 0.7 - 1.3 g/dL   Gamma Globulin 2.4 (H) 0.4 - 1.8 g/dL   M-Spike, % 2.0 (H) Not Observed g/dL   SPE Interp. Comment     Comment: (NOTE) The SPE pattern demonstrates a single peak (M-spike) in the gamma region which may represent monoclonal protein. This peak may also be caused by circulating  immune complexes, cryoglobulins, C-reactive protein, fibrinogen or hemolysis.  If clinically indicated, the presence of a monoclonal gammopathy may be confirmed by immuno- fixation, as well as an evaluation of the urine for the presence of Bence-Jones protein. Performed At: [facility]  [address]  [place], [place] [id number] Rush Farmer MD [id number]    Comment Comment     Comment: (NOTE) Protein electrophoresis scan will follow via computer, mail, or courier delivery.    Globulin, Total 4.1 (H) 2.2 - 3.9 g/dL   A/G Ratio 1.0 0.7 - 1.7  Kappa/lambda light chains     Status: Abnormal   Collection Time: 02/01/21  9:23 AM  Result Value Ref Range   Kappa free light chain 794.6 (H) 3.3 - 19.4 mg/L   Lamda free light chains 15.9 5.7 - 26.3 mg/L   Kappa, lamda light chain ratio 49.97 (H) 0.26 - 1.65    Comment: (NOTE) Performed At: [facility] [place], [place] [id number] Rush Farmer MD YS:[id number]   SARS CORONAVIRUS 2 (TAT 6-24 HRS)     Status: None   Collection Time: 02/07/21  1:08 PM  Result Value Ref Range   SARS Coronavirus 2 NEGATIVE NEGATIVE    Comment: (NOTE) SARS-CoV-2 target nucleic acids are NOT DETECTED.  The SARS-CoV-2 RNA is generally detectable in upper and lower respiratory specimens during the acute phase of infection. Negative results do not preclude SARS-CoV-2 infection, do not rule out co-infections with other pathogens, and should not be used as the sole basis for treatment or other patient management decisions. Negative results must be combined with clinical observations, patient history, and epidemiological information. The expected result is Negative.  Fact Sheet for Patients: SugarRoll.be  Fact Sheet for Healthcare Providers: https://www.woods-mathews.com/  This test is not yet approved or cleared by the [place] FDA and  has been authorized for detection  and/or diagnosis of SARS-CoV-2 by FDA under an Emergency Use Authorization (EUA). This EUA will remain  in effect (meaning this test can be used) for the duration of the COVID-19 declaration under Se ction 564(b)(1) of the Act, 21 U.S.C. section 360bbb-3(b)(1), unless the authorization is terminated or revoked sooner.  Performed at [facility], [place] [address], [place], [place] [address]   I-STAT, [place] Clap 8     Status: Abnormal   Collection Time: 02/08/21  8:41 AM  Result Value Ref Range   Sodium 141 135 - 145 mmol/L   Potassium 4.3 3.5 - 5.1 mmol/L   Chloride 110 98 - 111 mmol/L   BUN 37 (H) 8 - 23 mg/dL   Creatinine, Ser 5.70 (H) 0.61 - 1.24 mg/dL   Glucose, Bld 92 70 - 99 mg/dL    Comment: Glucose reference range applies only to samples taken after fasting for at least 8 hours.   Calcium, Ion 1.12 (L) 1.15 - 1.40 mmol/L   TCO2 19 (L) 22 - 32 mmol/L   Hemoglobin 12.9 (L) 13.0 - 17.0 g/dL   HCT 38.0 (L) 39.0 - 52.0 %  Surgical pathology     Status: None   Collection Time: 02/21/21 12:00 AM  Result Value Ref Range   SURGICAL PATHOLOGY      SURGICAL PATHOLOGY CASE: [id number] PATIENT: Ernest Barnett Bone Marrow Report     Clinical History: MGUS     DIAGNOSIS:  BONE MARROW, ASPIRATE, CLOT, CORE: -Slightly hypercellular bone marrow for age with plasma cell neoplasm -Lymphocytosis. -See comment  PERIPHERAL BLOOD: -Normocytic-normochromic anemia -Leukocytosis with lymphocytosis  COMMENT:  The bone marrow is slightly hypercellular for age with trilineage hematopoiesis and generally nonspecific changes.  In this background, the plasma cells are increased in number representing 11% of all cells in the aspirate associated with numerous small clusters in the biopsy sections.  The plasma cells display kappa light chain restriction consistent with plasma cell neoplasm.  Correlation with cytogenetic and FISH studies is recommended. The bone marrow and peripheral  blood also show lymphocytosis primarily composed of small lymphoid cells without overt atypia admixed with large granular lymphocytes.  Flo w cytometric analysis shows predominance of T lymphocytes with generally nonspecific changes and no monoclonal B cell population is identified.  The features are not considered diagnostic of a lymphoproliferative process and may be secondary in nature, as a result, of infection, immune mediated process, immunosuppression/dysregulation, etc. Clinical correlation and follow-up are recommended.  MICROSCOPIC DESCRIPTION:  PERIPHERAL BLOOD SMEAR: The red blood cells display mild anisopoikilocytosis with mild polychromasia.  The white blood cells are slightly increased in number with predominance of lymphocytes consisting of small lymphoid cells without overt atypia and large granular lymphocytes.  The platelets are normal in number.  BONE MARROW ASPIRATE: Bone marrow particles present. Erythroid precursors: Progressive maturation with occasional late precursors displaying nuclear cytoplasmic dyssynchrony Granulocytic precursors: Orderly and progressive maturation for the  most part.  Occasional hypogranular and /or hypolobated neutrophils are seen Megakaryocytes: Abundant with predominantly normal morphology Lymphocytes/plasma cells: The plasma cells are increased in number representing 11% of all cells with lack of large aggregates or sheets. The lymphocytes are relatively abundant (27%) and mostly consisting of small lymphoid cells with high nuclear cytoplasmic ratio and round to slightly irregular nuclei, dense chromatin, and inconspicuous nucleoli. Admixed are scattered large granular lymphocytes.  Large lymphoid aggregates are not seen.  TOUCH PREPARATIONS: A mixture of cell types seen.  CLOT AND BIOPSY: The clot sections are suboptimal mostly consisting of blood clot.  The core biopsy shows 50% to 60% cellularity with a mixture of myeloid  cell types.  Large aggregates or sheets of plasma cells are not identified, and significant lymphoid aggregates are not seen. Immunohistochemical stain for CD138 and in situ hybridization for kap pa and lambda were performed on blocks A1 with appropriate controls.  CD138 highlights an increased plasma cell component consisting of interstitial cells and numerous predominantly small clusters.  The plasma cells display kappa light chain restriction.  IRON STAIN: Iron stains are performed on a bone marrow aspirate or touch imprint smear and section of clot. The controls stained appropriately.       Storage Iron: Increased      Ring Sideroblasts: Absent  ADDITIONAL DATA/TESTING: The specimen was sent for cytogenetic analysis and FISH for multiple myeloma and separate report will follow.  Flow cytometric analysis of the lymphoid population [phone]) shows predominance of T lymphocytes expressing pan T cell antigens but with relative abundance of CD8 positive cells and slight reversal of the CD4: CD8 ratio.  A minor fraction of T cells also expresses CD56.  This is associated with slight increase in gamma-delta T-cells.  No monoclonal B-cell population identified .  CELL COUNT DATA:  Bone Marrow count performed on 500 cells shows: Blasts:   0%   Myeloid:  44% Promyelocytes: 0%   Erythroid:     13% Myelocytes:    3%   Lymphocytes:   27% Metamyelocytes:     0%   Plasma cells:  11% Bands:    11% Neutrophils:   28%  M:E ratio:     3.4 Eosinophils:   2% Basophils:     0% Monocytes:     5%  Lab Data: CBC performed on 02/21/2021 shows: WBC: 11.9 k/uL Neutrophils:   38% Hgb: 11.7g/dL  Lymphocytes:   53% HCT: 36.0 %  Monocytes:     7% MCV: 97.6 fL   Eosinophils:   2% RDW: 15.2 %    Basophils:     0% PLT: 211 k/uL    GROSS DESCRIPTION:  A: Aspirate smear  B: The specimen is received in B-plus fixative and consists of a 5.0 x 5.0 x 2.0 mm aggregate of red-brown clotted blood.   The specimen is entirely submitted in one cassette.  C: The specimen is received in B-plus fixative and consists of a 1.0 cm in length by 0.2 cm in diameter core of tan-brown bone.  The specimen is entirely submitted in one cassette following deca lcification with Immunocal.  Craig Staggers 02/21/2021)   Final Diagnosis performed by Susanne Greenhouse, MD.   Electronically signed 02/23/2021 Technical and / or Professional components performed at [facility], [place] [address], [place], [place] [address].  Immunohistochemistry Technical component (if applicable) was performed at [facility]. [address] [place], [place], [place] [address].   IMMUNOHISTOCHEMISTRY DISCLAIMER (if applicable): Some of these immunohistochemical stains may have been developed and the performance characteristics determine by [facility]. Some may not have been cleared or approved by the U.S. Food and Drug Administration. The FDA has determined that such clearance or approval is not necessary. This test is used for clinical purposes. It should not be regarded as investigational or for research. This laboratory is certified under the [place] (CLIA-88) as qualifie d to perform high complexity clinical laboratory testing.  The controls stained appropriately.   Surgical pathology     Status: None   Collection Time: 02/21/21 12:00 AM  Result Value Ref Range   SURGICAL PATHOLOGY      Surgical Pathology CASE: [id number] PATIENT: Ernest Barnett Flow Pathology Report     Clinical history: None provided     DIAGNOSIS:  -Predominance of T lymphocytes with relative abundance of CD8 positive cells -Slight increase in gamma-delta T-cells. -No monoclonal B cell population identified -See comment  COMMENT:  Flow cytometric analysis of the lymphoid population representing 43% of all cells in the sample shows predominance of T  lymphocytes expressing pan T cell antigens but with a relative abundance of CD8 positive cells and slight reversal of the CD4: CD8 ratio.  This is associated with slight increase in gamma delta T cells.  No monoclonal B-cell population is identified. The overall findings are not considered specific or diagnostic of a lymphoproliferative process and may be related to infection, immune mediated process, immune suppression/immune dysregulation.  Clinical correlation is recommended.   GATING AND PHENOTYPIC ANALYSIS:  Gate d population: Flow cytometric immunophenotyping is performed using antibodies to the antigens listed in the table below. Electronic gates are placed around a cell cluster displaying light scatter properties corresponding to: lymphocytes  Abnormal Cells in gated population: See comment  Phenotype of Abnormal Cells: See comment                       Lymphoid Antigens       Myeloid Antigens Miscellaneous CD2  tested    CD10 tested    CD11b     ND   CD45 tested CD3  tested    CD19 tested    CD11c     ND   HLA-Dr    ND CD4  tested    CD20 tested    CD13 ND   CD34 tested CD5  tested    CD22 ND  CD14 ND   CD38 tested CD7  tested    CD79b     ND   CD15 ND   CD138     ND CD8  tested    CD103     ND   CD16 ND   TdT  ND CD25 ND   CD200     tested    CD33 ND   CD123     ND TCRab     ND   sKappa    tested    CD64 ND   CD41 ND TCRgd     tested    sLambda   tested    CD117     ND   CD61 ND CD56 tested    cKappa    ND   MPO  ND   CD71 ND CD57 ND   cLambda   ND        [id number]     GROSS DESCRIPTION:  Reference Case [id number]    Final Diagnosis performed by Susanne Greenhouse, MD.   Electronically signed 02/23/2021 Technical and / or Professional components performed at [facility], [place] [address] [place], [place] [address].  The above tests were developed and their performance characteristics determined by the [facility] system for the physical  and immunophenotypic characterization of cell populations. They have not been cleared by the U.S. Food and Drug administration. The  FDA has determined that such clearance or approval is not necessary. This test is used for clinical purposes. It should not be  regarded as investigational or for research   CBC with Differential/Platelet     Status: Abnormal   Collection Time: 02/21/21  8:00 AM  Result Value Ref Range   WBC 11.9 (H) 4.0 - 10.5 K/uL   RBC 3.69 (L) 4.22 - 5.81 MIL/uL   Hemoglobin 11.7 (L) 13.0 - 17.0 g/dL   HCT 36.0 (L) 39.0 - 52.0 %   MCV 97.6 80.0 - 100.0 fL   MCH 31.7 26.0 - 34.0 pg   MCHC 32.5 30.0 - 36.0 g/dL   RDW 15.2 11.5 - 15.5 %   Platelets 211 150 - 400 K/uL   nRBC 0.0 0.0 - 0.2 %   Neutrophils Relative % 35 %   Neutro Abs 4.1 1.7 - 7.7 K/uL   Lymphocytes Relative 52 %   Lymphs Abs 6.3 (H) 0.7 - 4.0 K/uL   Monocytes Relative 9 %   Monocytes Absolute 1.0 0.1 - 1.0 K/uL   Eosinophils Relative 3 %   Eosinophils Absolute 0.3 0.0 - 0.5 K/uL   Basophils Relative 1 %   Basophils Absolute 0.1 0.0 - 0.1 K/uL   WBC Morphology >20% Large granular lymphocytes    Immature Granulocytes 0 %   Abs Immature Granulocytes 0.05 0.00 - 0.07 K/uL   Acanthocytes PRESENT    Burr Cells PRESENT     Comment: Performed at [facility], [place] [address], [place], [place] [address]    RADIOGRAPHIC STUDIES: I have personally reviewed the radiological images as listed and agreed with the findings in the report. DG Bone Survey Met  Result Date: 02/01/2021 CLINICAL DATA:  MGUS EXAM: METASTATIC BONE SURVEY COMPARISON:  None. FINDINGS: Heart is normal size.  No confluent airspace opacities or effusions. No suspicious focal lytic lesion or acute bony abnormality. Degenerative changes in the shoulders, lower cervical spine and lumbar spine. There appear to be bilateral L5 pars defects with 10 mm of anterolisthesis of L5 on S1. IMPRESSION: No  acute bony abnormality or  focal suspicious lytic lesion. Electronically Signed   By: Rolm Baptise M.D.   On: 02/01/2021 21:11   CT CHEST LUNG CANCER SCREENING LOW DOSE WO CONTRAST  Result Date: 02/18/2021 CLINICAL DATA:  Eighty-four pack-year smoking history, quitting 5 years ago. EXAM: CT CHEST WITHOUT CONTRAST LOW-DOSE FOR LUNG CANCER SCREENING TECHNIQUE: Multidetector CT imaging of the chest was performed following the standard protocol without IV contrast. COMPARISON:  01/04/2020 chest radiograph from [facility] [place]. No prior CT. FINDINGS: Cardiovascular: Bovine arch. Aortic atherosclerosis. Mild cardiomegaly, without pericardial effusion. Multivessel coronary artery atherosclerosis. Mediastinum/Nodes: No mediastinal or definite hilar adenopathy, given limitations of unenhanced CT. Suspicion of mild distal esophageal wall thickening on 39/2. Apparent asymmetry involving the left side of the larynx on the first image of the exam. Lungs/Pleura: No pleural fluid. Mild centrilobular emphysema. Motion degradation inferiorly is mild. No suspicious pulmonary nodule or mass. Slightly peripheral predominant bilateral areas of interstitial thickening and mild architectural distortion. Upper Abdomen: Splenectomy. Cholecystectomy. Normal imaged portions of the liver, stomach, pancreas, adrenal glands, kidneys. Musculoskeletal: Remote left rib fractures. IMPRESSION: 1. Lung-RADS 1, negative. Continue annual screening with low-dose chest CT without contrast in 12 months. 2. Aortic atherosclerosis (ICD10-I70.0), coronary artery atherosclerosis and emphysema (ICD10-J43.9). 3. Peripheral predominant areas of interstitial thickening and mild architectural distortion, likely post infectious or inflammatory scarring. Sequelae of prior COVID-19 pneumonia could have this appearance. 4. Mild distal esophageal wall thickening, suggesting esophagitis. The patient has had prior endoscopies and esophageal dilatations. 5. Apparent asymmetry involving the left  side of the larynx on the first image of the exam. Correlate with any symptoms of hoarseness to suggest laryngeal pathology. Electronically Signed   By: Abigail Miyamoto M.D.   On: 02/18/2021 18:38    ASSESSMENT:  1.  IgG kappa light chain monoclonal gammopathy: -Seen at the request of Dr. [name] for IgG kappa monoclonal protein detected in the urine on work-up for CKD. -He is hard of hearing.  Denies any fevers, night sweats or weight loss.  Denies any new onset bone pains. -No recurrent infections.  2.  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  Smoked 2 packs/day for 32 years and quit smoking cigarettes and vaping now. -Mother had small cell lung cancer.  3.  CKD: -He has stage V CKD thought to be secondary to hypertension. -Renal ultrasound on 12/19/2020 was negative for obstructive uropathy and findings were consistent with chronic medical renal disease.  Bilateral kidney cysts were seen. -He has 2.7 g of proteinuria on 24-hour urine. -Kidney biopsy was done on 01/25/2021.    PLAN:  1.  IgG kappa light chain monoclonal gammopathy: -He has 2 g of M spike. - Dr. [name] has called and told me that his kidney biopsy was positive for kappa light chain deposition. - I have talked to the patient about the need for bone marrow aspiration and biopsy to evaluate for multiple myeloma. - We talked about the procedure in detail including rare chance of bleeding and infection. - He gives [place] permission to proceed with the bone marrow biopsy today. - We will follow him in 2 weeks to discuss results and further plan.  2.  Smoking history: -Because of his smoking history, we have ordered CT scan at prior visit.    All questions were answered. The patient knows to call the clinic with any problems, questions or concerns.   Derek Jack, MD 03/02/21 1:15 PM  [place]  [phone]   Saunders Revel, am acting as a scribe for Dr.  Sanda Linger.  I, Derek Jack MD, have reviewed the above documentation for accuracy and completeness, and I agree with the above.

## 2021-03-05 ENCOUNTER — Encounter (HOSPITAL_COMMUNITY): Payer: Self-pay | Admitting: Hematology

## 2021-03-06 ENCOUNTER — Encounter (INDEPENDENT_AMBULATORY_CARE_PROVIDER_SITE_OTHER): Payer: Medicare HMO | Admitting: Ophthalmology

## 2021-03-06 ENCOUNTER — Inpatient Hospital Stay (HOSPITAL_COMMUNITY): Payer: Medicare HMO | Attending: Hematology | Admitting: Hematology

## 2021-03-06 ENCOUNTER — Other Ambulatory Visit: Payer: Self-pay

## 2021-03-06 VITALS — BP 118/74 | HR 96 | Temp 98.9°F | Resp 17 | Wt 240.1 lb

## 2021-03-06 DIAGNOSIS — Z5111 Encounter for antineoplastic chemotherapy: Secondary | ICD-10-CM | POA: Insufficient documentation

## 2021-03-06 DIAGNOSIS — Z5112 Encounter for antineoplastic immunotherapy: Secondary | ICD-10-CM | POA: Insufficient documentation

## 2021-03-06 DIAGNOSIS — C9000 Multiple myeloma not having achieved remission: Secondary | ICD-10-CM | POA: Diagnosis not present

## 2021-03-06 DIAGNOSIS — D472 Monoclonal gammopathy: Secondary | ICD-10-CM | POA: Diagnosis not present

## 2021-03-06 NOTE — Progress Notes (Signed)
START ON PATHWAY REGIMEN - Multiple Myeloma and Other Plasma Cell Dyscrasias ? ? ?  A cycle is every 28 days: ?    Dexamethasone  ?    Bortezomib  ?    Cyclophosphamide  ? ?**Always confirm dose/schedule in your pharmacy ordering system** ? ?Patient Characteristics: ?Multiple Myeloma, Newly Diagnosed, Transplant Eligible, Standard Risk ?Disease Classification: Multiple Myeloma ?R-ISS Staging: II ?Therapeutic Status: Newly Diagnosed ?Is Patient Eligible for Transplant<= Transplant Eligible ?Risk Status: Standard Risk ?Intent of Therapy: ?Non-Curative / Palliative Intent, Discussed with Patient ?

## 2021-03-06 NOTE — Progress Notes (Signed)
[place], [place] [address]   CLINIC:  Medical Oncology/Hematology  PCP:  Neale Burly, MD [place] / [place] [address]  [phone]  REASON FOR VISIT:  Follow-up for MGUS  PRIOR THERAPY: None  CURRENT THERAPY: To start CyBorD weekly  INTERVAL HISTORY:  [name], a 65 y.o. male, returns for routine follow-up for his MGUS. Ernest Barnett was last seen on 02/21/2021.  Today he is accompanied by his wife and he reports feeling well. He tolerated the bone marrow biopsy well and is recovering from it. He was not started on HD yet.  He has a detached retina and is scheduled to see Dr. Zadie Rhine on 05/04. He is scheduled to have a PET scan on 05/05.   REVIEW OF SYSTEMS:  Review of Systems  Constitutional: Positive for fatigue (75%). Negative for appetite change.  All other systems reviewed and are negative.   PAST MEDICAL/SURGICAL HISTORY:  Past Medical History:  Diagnosis Date  . Chronic kidney disease   . COPD (chronic obstructive pulmonary disease) ([place])   . CVA (cerebral vascular accident) ([place]) 2013  . GERD (gastroesophageal reflux disease)   . HOH (hard of hearing)   . Hypercholesteremia   . Hypertension   . Stroke ([place]) 03/24/2012   left sided weakness  . Vitamin D deficiency    Past Surgical History:  Procedure Laterality Date  . AV FISTULA PLACEMENT Right 02/08/2021   Procedure: RIGHT ARM ARTERIOVENOUS (AV) FISTULA CREATION;  Surgeon: [name], MD;  Location: [facility];  Service: Vascular;  Laterality: Right;  . BIOPSY  11/12/2018   Procedure: BIOPSY;  Surgeon: Rogene Houston, MD;  Location: [facility];  Service: Endoscopy;;  colon  . BIOPSY  05/10/2020   Procedure: BIOPSY;  Surgeon: Rogene Houston, MD;  Location: [facility];  Service: Endoscopy;;  esophagus  . CATARACT EXTRACTION     right eye  . CATARACT EXTRACTION W/PHACO  10/08/2012   Procedure: CATARACT EXTRACTION PHACO AND INTRAOCULAR LENS  PLACEMENT (IOC);  Surgeon: Tonny Branch, MD;  Location: [facility];  Service: Ophthalmology;  Laterality: Left;  CDE:6.64  . CHOLECYSTECTOMY     [facility]  . COLONOSCOPY N/A 11/12/2018   Procedure: COLONOSCOPY;  Surgeon: Rogene Houston, MD;  Location: [facility];  Service: Endoscopy;  Laterality: N/A;  1030  . ELBOW FRACTURE SURGERY     left  . ESOPHAGEAL DILATION N/A 11/12/2018   Procedure: ESOPHAGEAL DILATION;  Surgeon: Rogene Houston, MD;  Location: [facility];  Service: Endoscopy;  Laterality: N/A;  . ESOPHAGEAL DILATION N/A 05/10/2020   Procedure: ESOPHAGEAL DILATION;  Surgeon: Rogene Houston, MD;  Location: [facility];  Service: Endoscopy;  Laterality: N/A;  . ESOPHAGOGASTRODUODENOSCOPY N/A 11/12/2018   Procedure: ESOPHAGOGASTRODUODENOSCOPY (EGD);  Surgeon: Rogene Houston, MD;  [facility]: [facility];  Service: Endoscopy;  Laterality: N/A;  . ESOPHAGOGASTRODUODENOSCOPY N/A 05/10/2020   Procedure: ESOPHAGOGASTRODUODENOSCOPY (EGD);  Surgeon: Rogene Houston, MD;  [facility];  Service: Endoscopy;  Laterality: N/A;  210  . HERNIA REPAIR     right inguinal  . HYDROCELE EXCISION / REPAIR    . POLYPECTOMY  11/12/2018   Procedure: POLYPECTOMY;  Surgeon: Rogene Houston, MD;  Location: [facility];  Service: Endoscopy;;  colon   . RETINAL DETACHMENT SURGERY Left 2019  . SPLENECTOMY, TOTAL    . TONSILLECTOMY    . VOCAL CORD INJECTION  removal of polyp-2005    SOCIAL HISTORY:  Social History   Socioeconomic History  . Marital status: Significant Other    Spouse name: Not on file  . Number of children: Not on file  . Years of education: Not on file  . Highest education level: Not on file  Occupational History  . Not on file  Tobacco Use  . Smoking status: Former Smoker    Packs/day: 2.00    Years: 40.00    Pack years: 80.00    Types: Cigarettes, E-cigarettes  . Smokeless tobacco: Current User  . Tobacco comment: Pt only vap now  Vaping Use  . Vaping Use: Every  day  . Substances: Nicotine  Substance and Sexual Activity  . Alcohol use: Yes    Comment: occassional  . Drug use: No  . Sexual activity: Yes    Birth control/protection: None  Other Topics Concern  . Not on file  Social History Narrative  . Not on file   Social Determinants of Health   Financial Resource Strain: Low Risk   . Difficulty of Paying Living Expenses: Not hard at all  Food Insecurity: No Food Insecurity  . Worried About Charity fundraiser in the Last Year: Never true  . Ran Out of Food in the Last Year: Never true  Transportation Needs: No Transportation Needs  . Lack of Transportation (Medical): No  . Lack of Transportation (Non-Medical): No  Physical Activity: Insufficiently Active  . Days of Exercise per Week: 2 days  . Minutes of Exercise per Session: 20 min  Stress: No Stress Concern Present  . Feeling of Stress : Not at all  Social Connections: Moderately Isolated  . Frequency of Communication with Friends and Family: More than three times a week  . Frequency of Social Gatherings with Friends and Family: More than three times a week  . Attends Religious Services: 1 to 4 times per year  . Active Member of Clubs or Organizations: No  . Attends Archivist Meetings: Never  . Marital Status: Never married  Intimate Partner Violence: Not At Risk  . Fear of Current or Ex-Partner: No  . Emotionally Abused: No  . Physically Abused: No  . Sexually Abused: No    FAMILY HISTORY:  No family history on file.  CURRENT MEDICATIONS:  Current Outpatient Medications  Medication Sig Dispense Refill  . amLODipine (NORVASC) 5 MG tablet Take 5 mg by mouth daily.    [name] aspirin EC 81 MG tablet Take 1 tablet (81 mg total) by mouth daily. 30 tablet 11  . calcitRIOL (ROCALTROL) 0.25 MCG capsule Take 0.25 mcg by mouth every Monday, Wednesday, and Friday.    . cholecalciferol (VITAMIN D3) 25 MCG (1000 UNIT) tablet Take 1,000 Units by mouth daily.    . Omega-3 Fatty  Acids (FISH OIL) 1000 MG CAPS Take 1,000 mg by mouth in the morning, at noon, and at bedtime.     . pantoprazole (PROTONIX) 40 MG tablet Take 1 tablet (40 mg total) by mouth 2 (two) times daily. 180 tablet 3  . polyethylene glycol (MIRALAX / GLYCOLAX) packet Take 17 g by mouth daily. Patient states that he takes as needed. (Patient taking differently: Take 17 g by mouth daily as needed for mild constipation.) 30 each 5  . pravastatin (PRAVACHOL) 40 MG tablet Take 40 mg by mouth daily.    . sodium bicarbonate 650 MG tablet Take 1,300 mg by mouth 3 (three) times daily.    [name]  tamsulosin (FLOMAX) 0.4 MG CAPS capsule Take 0.4 mg by mouth daily.      No current facility-administered medications for this visit.    ALLERGIES:  No Known Allergies  PHYSICAL EXAM:  Performance status (ECOG): 1 - Symptomatic but completely ambulatory  Vitals:   03/06/21 1626  BP: 118/74  Pulse: 96  Resp: 17  Temp: 98.9 F (37.2 C)  SpO2: 100%   Wt Readings from Last 3 Encounters:  03/06/21 240 lb 2 oz (108.9 kg)  02/05/21 236 lb (107 kg)  02/01/21 237 lb (107.5 kg)   Physical Exam Vitals reviewed.  Constitutional:      Appearance: Normal appearance. He is obese.  Neurological:     General: No focal deficit present.     Mental Status: He is alert and oriented to person, place, and time.  Psychiatric:        Mood and Affect: Mood normal.        Behavior: Behavior normal.     LABORATORY DATA:  I have reviewed the labs as listed.  CBC Latest Ref Rng & Units 02/21/2021 02/08/2021 02/01/2021  WBC 4.0 - 10.5 K/uL 11.9(H) - 11.1(H)  Hemoglobin 13.0 - 17.0 g/dL 11.7(L) 12.9(L) 12.2(L)  Hematocrit 39.0 - 52.0 % 36.0(L) 38.0(L) 37.6(L)  Platelets 150 - 400 K/uL 211 - 231   CMP Latest Ref Rng & Units 02/08/2021 02/01/2021 10/06/2012  Glucose 70 - 99 mg/dL 92 91 105(H)  BUN 8 - 23 mg/dL 37(H) 31(H) 20  Creatinine 0.61 - 1.24 mg/dL 5.70(H) 4.81(H) 1.25  Sodium 135 - 145 mmol/L 141 138 138  Potassium 3.5 - 5.1  mmol/L 4.3 4.5 4.4  Chloride 98 - 111 mmol/L 110 109 103  CO2 22 - 32 mmol/L - 20(L) 24  Calcium 8.9 - 10.3 mg/dL - 9.4 10.0  Total Protein 6.5 - 8.1 g/dL - 8.9(H) -  Total Bilirubin 0.3 - 1.2 mg/dL - 0.6 -  Alkaline Phos 38 - 126 U/L - 27(L) -  AST 15 - 41 U/L - 7(L) -  ALT 0 - 44 U/L - 13 -      Component Value Date/Time   RBC 3.69 (L) 02/21/2021 0800   MCV 97.6 02/21/2021 0800   MCH 31.7 02/21/2021 0800   MCHC 32.5 02/21/2021 0800   RDW 15.2 02/21/2021 0800   LYMPHSABS 6.3 (H) 02/21/2021 0800   MONOABS 1.0 02/21/2021 0800   EOSABS 0.3 02/21/2021 0800   BASOSABS 0.1 02/21/2021 0800    DIAGNOSTIC IMAGING:  I have independently reviewed the scans and discussed with the patient. CT CHEST LUNG CANCER SCREENING LOW DOSE WO CONTRAST  Result Date: 02/18/2021 CLINICAL DATA:  Eighty-four pack-year smoking history, quitting 5 years ago. EXAM: CT CHEST WITHOUT CONTRAST LOW-DOSE FOR LUNG CANCER SCREENING TECHNIQUE: Multidetector CT imaging of the chest was performed following the standard protocol without IV contrast. COMPARISON:  01/04/2020 chest radiograph from [facility] [place]. No prior CT. FINDINGS: Cardiovascular: Bovine arch. Aortic atherosclerosis. Mild cardiomegaly, without pericardial effusion. Multivessel coronary artery atherosclerosis. Mediastinum/Nodes: No mediastinal or definite hilar adenopathy, given limitations of unenhanced CT. Suspicion of mild distal esophageal wall thickening on 39/2. Apparent asymmetry involving the left side of the larynx on the first image of the exam. Lungs/Pleura: No pleural fluid. Mild centrilobular emphysema. Motion degradation inferiorly is mild. No suspicious pulmonary nodule or mass. Slightly peripheral predominant bilateral areas of interstitial thickening and mild architectural distortion. Upper Abdomen: Splenectomy. Cholecystectomy. Normal imaged portions of the liver, stomach, pancreas, adrenal glands, kidneys. Musculoskeletal:  Remote left rib  fractures. IMPRESSION: 1. Lung-RADS 1, negative. Continue annual screening with low-dose chest CT without contrast in 12 months. 2. Aortic atherosclerosis (ICD10-I70.0), coronary artery atherosclerosis and emphysema (ICD10-J43.9). 3. Peripheral predominant areas of interstitial thickening and mild architectural distortion, likely post infectious or inflammatory scarring. Sequelae of prior COVID-19 pneumonia could have this appearance. 4. Mild distal esophageal wall thickening, suggesting esophagitis. The patient has had prior endoscopies and esophageal dilatations. 5. Apparent asymmetry involving the left side of the larynx on the first image of the exam. Correlate with any symptoms of hoarseness to suggest laryngeal pathology. Electronically Signed   By: Abigail Miyamoto M.D.   On: 02/18/2021 18:38     ASSESSMENT:  1.  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  Basis for CKD is hypertension associated arteriosclerosis with arterionephrosclerosis. - Bone marrow biopsy on 02/21/2021 with slightly hypercellular bone marrow with trilineage hematopoiesis.  Plasma cells increased in number representing 11% of all cells with kappa light chain restriction. - Myeloma FISH panel with no evidence of T p53.  Plasma cell enrichment yielded limited cellularity, therefore only tested for T p53 probes at. - Chromosome analysis 45, X,- Y (8)/46, XY (12) - Labs on 02/01/2021-M spike 2 g, kappa light chain 794, free light chain ratio 49.97.  Beta-2 microglobulin 5.5, albumin 3.5.  LDH normal.  2.  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  Smoked 2 packs/day for 32 years and quit smoking cigarettes and vaping now. -Mother had small cell lung cancer.  3.  CKD: -He has stage V CKD thought to be secondary to hypertension. -Renal ultrasound on 12/19/2020  was negative for obstructive uropathy and findings were consistent with chronic medical renal disease.  Bilateral kidney cysts were seen. -He has 2.7 g of proteinuria on 24-hour urine. -Kidney biopsy was done on 01/25/2021.   PLAN:  1.  Stage II, standard risk IgG kappa multiple myeloma: -I have discussed the findings of bone marrow biopsy with the patient in detail. - I have recommended 24-hour urine for total protein, UPEP, urine immunofixation as a baseline prior to start of therapy. - He is already scheduled for PET scan this Friday. - Discussed treatment with CyBorD regimen because of his volatile renal function.  Once his renal function stabilizes, we will switch him to lenalidomide based regimen. - We talked about the CyBorD regimen with weekly cyclophosphamide and Velcade with dexamethasone 40 mg.  We talked about side effects in detail. - We have also talked about consolidation with autologous stem cell transplant. - We will make a referral to the closest transplant center. - We will likely get him started on treatment this week because of his rapidly worsening renal function.  2.  Smoking history: -We reviewed CT chest from 02/15/2021 which was lung RADS 1.  3.  ID prophylaxis: - Once his creatinine stabilizes, we will start him on acyclovir 400 mg once to twice daily based on his renal function. - He will also start on aspirin 81 mg for thromboprophylaxis.  4.  Myeloma bone disease: -Due to his poor renal function, will likely consider denosumab or bisphosphonates like Zometa.  Orders placed this encounter:  Orders Placed This Encounter  Procedures  . 24 hr Ur UPEP/UIFE/Light Chains/TP   Total time spent is 40 minutes with more than 70% of the time spent face-to-face discussing new diagnosis, prognosis,  treatment plan, counseling and coordination of care.  Derek Jack, MD [place] [phone]   I, Milinda Antis, am acting as a scribe for Dr.  Sanda Linger.  I, Derek Jack MD, have reviewed the above documentation for accuracy and completeness, and I agree with the above.

## 2021-03-06 NOTE — Patient Instructions (Addendum)
[place] at [facility] Discharge Instructions  You were seen and examined today by Dr. Delton Coombes.   You have been diagnosed with Multiple Myeloma, this is a type of blood cancer arising from the bone marrow. This has began to deplete your kidney function. Dr. Delton Coombes has recommended a regimen known as CyBorD with the ultimate goal of getting you stable enough for a bone marrow transplant. A bone marrow transplant could only be done if the kidney function improves and would be done at [facility].  This cancer is not curable but is controllable for a long time!   Thank you for choosing [place] at [facility] to provide your oncology and hematology care.  To afford each patient quality time with our provider, please arrive at least 15 minutes before your scheduled appointment time.   If you have a lab appointment with the [facility] please come in thru the Main Entrance and check in at the main information desk.  You need to re-schedule your appointment should you arrive 10 or more minutes late.  We strive to give you quality time with our providers, and arriving late affects you and other patients whose appointments are after yours.  Also, if you no show three or more times for appointments you may be dismissed from the clinic at the providers discretion.     Again, thank you for choosing [facility].  Our hope is that these requests will decrease the amount of time that you wait before being seen by our physicians.       _____________________________________________________________  Should you have questions after your visit to [facility], please contact our office at [phone] and follow the prompts.  Our office hours are 8:00 a.m. and 4:30 p.m. Monday - Friday.  Please note that voicemails left after 4:00 p.m. may not be returned until the following business day.  We are closed weekends and major  holidays.  You do have access to a nurse 24-7, just call the main number to the clinic [phone] and do not press any options, hold on the line and a nurse will answer the phone.    For prescription refill requests, have your pharmacy contact our office and allow 72 hours.    Due to Covid, you will need to wear a mask upon entering the hospital. If you do not have a mask, a mask will be given to you at the Main Entrance upon arrival. For doctor visits, patients may have 1 support person age 62 or older with them. For treatment visits, patients can not have anyone with them due to social distancing guidelines and our immunocompromised population.

## 2021-03-07 ENCOUNTER — Encounter (INDEPENDENT_AMBULATORY_CARE_PROVIDER_SITE_OTHER): Payer: Self-pay | Admitting: Ophthalmology

## 2021-03-07 ENCOUNTER — Ambulatory Visit (INDEPENDENT_AMBULATORY_CARE_PROVIDER_SITE_OTHER): Payer: Medicare HMO | Admitting: Ophthalmology

## 2021-03-07 DIAGNOSIS — Z8669 Personal history of other diseases of the nervous system and sense organs: Secondary | ICD-10-CM

## 2021-03-07 DIAGNOSIS — C9000 Multiple myeloma not having achieved remission: Secondary | ICD-10-CM

## 2021-03-07 DIAGNOSIS — H35352 Cystoid macular degeneration, left eye: Secondary | ICD-10-CM

## 2021-03-07 DIAGNOSIS — H35372 Puckering of macula, left eye: Secondary | ICD-10-CM

## 2021-03-07 DIAGNOSIS — Z961 Presence of intraocular lens: Secondary | ICD-10-CM

## 2021-03-07 HISTORY — DX: Presence of intraocular lens: Z96.1

## 2021-03-07 HISTORY — DX: Cystoid macular degeneration, left eye: H35.352

## 2021-03-07 HISTORY — DX: Puckering of macula, left eye: H35.372

## 2021-03-07 NOTE — Assessment & Plan Note (Signed)
Patient and family report that he has had recent diagnosis of multiple myeloma.  Therapy is pending at this point pending "insurance approval "

## 2021-03-07 NOTE — Assessment & Plan Note (Signed)
Condition resolved OS in the past

## 2021-03-07 NOTE — Addendum Note (Signed)
Addended by: Joie Bimler on: 03/07/2021 09:51 AM   Modules accepted: Orders

## 2021-03-07 NOTE — Assessment & Plan Note (Signed)
No active disease OS 

## 2021-03-07 NOTE — Progress Notes (Signed)
03/07/2021     CHIEF COMPLAINT Patient presents for Blurred Vision (WIP blurred VA OD x 1 year //Pt c/o hazy and blurred VA OD x 6 months-1 yr approx. Pt sts blur only occurs at night and in dark lighting. Pt c/o difficulty with night driving. No ocular pain, flashes, or floaters OU. Pt denies changes to VA OS.)   HISTORY OF PRESENT ILLNESS: Ernest Barnett is a 65 y.o. male who presents to the clinic today for:   HPI    Blurred Vision    In right eye.  Onset was gradual.  Vision is blurred, hazy and fluctuating.  Severity is moderate.  This started 1 year ago.  Occurring intermittently.  It is worse in the evening.  Context:  distance vision, night driving and dim lighting.  Since onset it is stable.  Treatments tried include no treatments.  Response to treatment was no improvement. Additional comments: WIP blurred VA OD x 1 year   Pt c/o hazy and blurred VA OD x 6 months-1 yr approx. Pt sts blur only occurs at night and in dark lighting. Pt c/o difficulty with night driving. No ocular pain, flashes, or floaters OU. Pt denies changes to VA OS.       Last edited by Rockie Neighbours, [place] on 03/07/2021  9:08 AM. (History)      Referring physician: Neale Burly, MD [place],  [place] [id number]  HISTORICAL INFORMATION:   Selected notes from the [facility]: No current outpatient medications on file. (Ophthalmic Drugs)   No current facility-administered medications for this visit. (Ophthalmic Drugs)   Current Outpatient Medications (Other)  Medication Sig  . amLODipine (NORVASC) 5 MG tablet Take 5 mg by mouth daily.  [name] aspirin EC 81 MG tablet Take 1 tablet (81 mg total) by mouth daily.  . calcitRIOL (ROCALTROL) 0.25 MCG capsule Take 0.25 mcg by mouth every Monday, Wednesday, and Friday.  . cholecalciferol (VITAMIN D3) 25 MCG (1000 UNIT) tablet Take 1,000 Units by mouth daily.  . Omega-3 Fatty Acids (FISH OIL) 1000 MG CAPS Take 1,000  mg by mouth in the morning, at noon, and at bedtime.   . pantoprazole (PROTONIX) 40 MG tablet Take 1 tablet (40 mg total) by mouth 2 (two) times daily.  . polyethylene glycol (MIRALAX / GLYCOLAX) packet Take 17 g by mouth daily. Patient states that he takes as needed. (Patient taking differently: Take 17 g by mouth daily as needed for mild constipation.)  . pravastatin (PRAVACHOL) 40 MG tablet Take 40 mg by mouth daily.  . sodium bicarbonate 650 MG tablet Take 1,300 mg by mouth 3 (three) times daily.  . tamsulosin (FLOMAX) 0.4 MG CAPS capsule Take 0.4 mg by mouth daily.    No current facility-administered medications for this visit. (Other)      REVIEW OF SYSTEMS:    ALLERGIES No Known Allergies  PAST MEDICAL HISTORY Past Medical History:  Diagnosis Date  . Chronic kidney disease   . COPD (chronic obstructive pulmonary disease) ([place])   . CVA (cerebral vascular accident) ([place]) 2013  . GERD (gastroesophageal reflux disease)   . HOH (hard of hearing)   . Hypercholesteremia   . Hypertension   . Stroke ([place]) 03/24/2012   left sided weakness  . Vitamin D deficiency    Past Surgical History:  Procedure Laterality Date  . AV FISTULA PLACEMENT Right 02/08/2021   Procedure: RIGHT ARM ARTERIOVENOUS (AV)  FISTULA CREATION;  Surgeon: [name], MD;  Location: [facility];  Service: Vascular;  Laterality: Right;  . BIOPSY  11/12/2018   Procedure: BIOPSY;  Surgeon: Rogene Houston, MD;  Location: [facility] SU[facility];  Service: Endoscopy;;  colon  . BIOPSY  05/10/2020   Procedure: BIOPSY;  Surgeon: Rogene Houston, MD;  Location: [facility];  Service: Endoscopy;;  esophagus  . CATARACT EXTRACTION     right eye  . CATARACT EXTRACTION W/PHACO  10/08/2012   Procedure: CATARACT EXTRACTION PHACO AND INTRAOCULAR LENS PLACEMENT (IOC);  Surgeon: Tonny Branch, MD;  Location: [facility];  Service: Ophthalmology;  Laterality: Left;  CDE:6.64  . CHOLECYSTECTOMY     MMH  . COLONOSCOPY N/A 11/12/2018   Procedure:  COLONOSCOPY;  Surgeon: Rogene Houston, MD;  Location: [facility];  Service: Endoscopy;  Laterality: N/A;  1030  . ELBOW FRACTURE SURGERY     left  . ESOPHAGEAL DILATION N/A 11/12/2018   Procedure: ESOPHAGEAL DILATION;  Surgeon: Rogene Houston, MD;  Location: [facility];  Service: Endoscopy;  Laterality: N/A;  . ESOPHAGEAL DILATION N/A 05/10/2020   Procedure: ESOPHAGEAL DILATION;  Surgeon: Rogene Houston, MD;  [facility]: [facility];  Service: Endoscopy;  Laterality: N/A;  . ESOPHAGOGASTRODUODENOSCOPY N/A 11/12/2018   Procedure: ESOPHAGOGASTRODUODENOSCOPY (EGD);  Surgeon: Rogene Houston, MD;  [facility]: [facility];  Service: Endoscopy;  Laterality: N/A;  . ESOPHAGOGASTRODUODENOSCOPY N/A 05/10/2020   Procedure: ESOPHAGOGASTRODUODENOSCOPY (EGD);  Surgeon: Rogene Houston, MD;  [facility];  Service: Endoscopy;  Laterality: N/A;  210  . HERNIA REPAIR     right inguinal  . HYDROCELE EXCISION / REPAIR    . POLYPECTOMY  11/12/2018   Procedure: POLYPECTOMY;  Surgeon: Rogene Houston, MD;  Location: [facility];  Service: Endoscopy;;  colon   . RETINAL DETACHMENT SURGERY Left 2019  . SPLENECTOMY, TOTAL    . TONSILLECTOMY    . VOCAL CORD INJECTION     removal of polyp-2005    FAMILY HISTORY History reviewed. No pertinent family history.  SOCIAL HISTORY Social History   Tobacco Use  . Smoking status: Former Smoker    Packs/day: 2.00    Years: 40.00    Pack years: 80.00    Types: Cigarettes, E-cigarettes  . Smokeless tobacco: Current User  . Tobacco comment: Pt only vap now  Vaping Use  . Vaping Use: Every day  . Substances: Nicotine  Substance Use Topics  . Alcohol use: Yes    Comment: occassional  . Drug use: No         OPHTHALMIC EXAM:  Base Eye Exam    Visual Acuity (ETDRS)      Right Left   Dist cc 20/25 20/100   Dist ph cc  NI   Correction: Glasses       Tonometry (Tonopen, 9:13 AM)      Right Left   Pressure 19 12       Pupils       Dark Light Shape React APD   Right 5 5 Irregular Minimal None   Left 2 2 Round Minimal None       Visual Fields (Counting fingers)      Left Right     Full   Restrictions Total superior nasal deficiency        Extraocular Movement      Right Left    Full Full       Neuro/Psych  Oriented x3: Yes   Mood/Affect: Normal       Dilation    Both eyes: 1.0% Mydriacyl, 2.5% Phenylephrine @ 9:13 AM        Slit Lamp and Fundus Exam    External Exam      Right Left   External Normal Normal       Slit Lamp Exam      Right Left   Lids/Lashes Normal Normal   Conjunctiva/Sclera White and quiet White and quiet   Cornea Clear Clear   Anterior Chamber Deep and quiet, with strand from the capsule to the cornea extending from the 6:00 meridian of the anterior capsule towards the corneal incision site, no visible significant traction Deep and quiet   Iris Round and reactive Round and reactive   Lens Centered posterior chamber intraocular lens Centered posterior chamber intraocular lens   Anterior Vitreous Normal Normal       Fundus Exam      Right Left   Posterior Vitreous Normal Clear, vitrectomized   Disc Normal Normal   C/D Ratio 0.6 0.7   Macula Normal Normal   Vessels Normal Normal   Periphery Normal Good chorioretinal scarring 360, previous from para retinal detachment          IMAGING AND PROCEDURES  Imaging and Procedures for 03/07/21  OCT, Retina - OU - Both Eyes       Right Eye Quality was good. Scan locations included subfoveal. Central Foveal Thickness: 240. Progression has been stable. Findings include normal foveal contour.   Left Eye Quality was good. Scan locations included subfoveal. Central Foveal Thickness: 266. Progression has been stable.   Notes No active maculopathy OD or OS  No signs of active maculopathy or retinopathy of either eye                ASSESSMENT/PLAN:  Cystoid macular edema of left eye Condition resolved OS in the  past  Left epiretinal membrane No active disease OS  Multiple myeloma ([place]) Patient and family report that he has had recent diagnosis of multiple myeloma.  Therapy is pending at this point pending "insurance approval "        ICD-10-CM   1. Left epiretinal membrane  H35.372 OCT, Retina - OU - Both Eyes  2. Cystoid macular edema of left eye  H35.352   3. Pseudophakia  Z96.1   4. History of retinal detachment  Z86.69 OCT, Retina - OU - Both Eyes  5. Multiple myeloma not having achieved remission (HCC)  C90.00     1.  I explained to the patient and family that he should use vitamin A daily for 5 days potentially simply on a multivitamin on a daily basis or if not approved for via multivitamin use, use vitamin A supplement 1 pill daily for 5 days thereafter 2-3 times weekly.  This should replenish potential for night blindness difficulties from vitamin A deficiency as he does have GI issues.    2.  I also explained that there is a distant effect of cancer, multiple myeloma with antibody antiphotoreceptor , could be potential in his case yet this will not improve until multiple myeloma therapy is instituted as is currently contemplated  3.  No structural impediments exist at this time with excellent 20/25 vision distantly during daylight, treat with vitamin A for short period of time to see if his nighttime vision improves  Ophthalmic Meds Ordered this visit:  No orders of the defined types were placed in  this encounter.      Return in about 6 weeks (around 04/18/2021) for DILATE OU, COLOR FP, OCT.  There are no Patient Instructions on file for this visit.   Explained the diagnoses, plan, and follow up with the patient and they expressed understanding.  Patient expressed understanding of the importance of proper follow up care.   Clent Demark Shia Eber M.D. Diseases & Surgery of the Retina and Vitreous [facility]ina [facility] Diabetic [place] 03/07/21     Abbreviations: M myopia  (nearsighted); A astigmatism; H hyperopia (farsighted); P presbyopia; Mrx spectacle prescription;  CTL contact lenses; OD right eye; OS left eye; OU both eyes  XT exotropia; ET esotropia; PEK punctate epithelial keratitis; PEE punctate epithelial erosions; DES dry eye syndrome; MGD meibomian gland dysfunction; ATs artificial tears; PFAT's preservative free artificial tears; [place] nuclear sclerotic cataract; PSC posterior subcapsular cataract; ERM epi-retinal membrane; PVD posterior vitreous detachment; RD retinal detachment; DM diabetes mellitus; DR diabetic retinopathy; NPDR non-proliferative diabetic retinopathy; PDR proliferative diabetic retinopathy; CSME clinically significant macular edema; DME diabetic macular edema; dbh dot blot hemorrhages; CWS cotton wool spot; POAG primary open angle glaucoma; C/D cup-to-disc ratio; HVF humphrey visual field; GVF goldmann visual field; OCT optical coherence tomography; IOP intraocular pressure; BRVO Branch retinal vein occlusion; CRVO central retinal vein occlusion; CRAO central retinal artery occlusion; BRAO branch retinal artery occlusion; RT retinal tear; SB scleral buckle; PPV pars plana vitrectomy; VH Vitreous hemorrhage; PRP panretinal laser photocoagulation; IVK intravitreal kenalog; VMT vitreomacular traction; MH Macular hole;  NVD neovascularization of the disc; NVE neovascularization elsewhere; AREDS age related eye disease study; ARMD age related macular degeneration; POAG primary open angle glaucoma; EBMD epithelial/anterior basement membrane dystrophy; ACIOL anterior chamber intraocular lens; IOL intraocular lens; PCIOL posterior chamber intraocular lens; Phaco/IOL phacoemulsification with intraocular lens placement; [place] photorefractive keratectomy; LASIK laser assisted in situ keratomileusis; HTN hypertension; DM diabetes mellitus; COPD chronic obstructive pulmonary disease

## 2021-03-08 ENCOUNTER — Encounter (HOSPITAL_COMMUNITY)
Admission: RE | Admit: 2021-03-08 | Discharge: 2021-03-08 | Disposition: A | Discharge status: Home / Self Care | Payer: Medicare HMO | Source: Ambulatory Visit | Attending: Hematology | Admitting: Hematology

## 2021-03-08 ENCOUNTER — Other Ambulatory Visit: Payer: Self-pay

## 2021-03-08 DIAGNOSIS — D472 Monoclonal gammopathy: Secondary | ICD-10-CM | POA: Diagnosis not present

## 2021-03-08 LAB — GLUCOSE, CAPILLARY: Glucose-Capillary: 85 mg/dL (ref 70–99)

## 2021-03-08 MED ORDER — FLUDEOXYGLUCOSE F - 18 (FDG) INJECTION
11.9000 | Freq: Once | INTRAVENOUS | Status: AC | PRN
  Administered 2021-03-08: 11.9 via INTRAVENOUS

## 2021-03-09 ENCOUNTER — Other Ambulatory Visit: Payer: Self-pay | Admitting: *Deleted

## 2021-03-09 DIAGNOSIS — N184 Chronic kidney disease, stage 4 (severe): Secondary | ICD-10-CM

## 2021-03-09 NOTE — Patient Instructions (Signed)
[place] are diagnosed with multiple myeloma.  You will be treated in the clinic weekly with a combination of chemotherapy drugs.  Those drugs are bortezomib (Velcade), cyclophosphamide (Cytoxan), and dexamethasone (not a chemo drug).  The intent of treatment is to control this disease, keep it from spreading further, and to alleviate any symptoms you may be having related to this disease.  You will see the doctor regularly throughout treatment.  We will obtain blood work from you prior to every treatment and monitor your results to make sure it is safe to give your treatment. The doctor monitors your response to treatment by the way you are feeling, your blood work, and by obtaining scans periodically.  There will be wait times while you are here for treatment.  It will take about 30 minutes to 1 hour for your lab work to result.  Then there will be wait times while pharmacy mixes your medications.    Bortezomib (Velcade)  About This Drug  Bortezomib is used to treat cancer. It is given in the vein (IV) or by a shot under the skin (subcutaneously).  You will receive this injection under your skin.  Possible Side Effects  . Bone marrow suppression. Decrease in the number of white blood cells, red blood cells, and platelets. This may raise your risk of infection, make you tired and weak (fatigue), and raise your risk of bleeding.  . Nausea and vomiting (throwing up)  . Constipation (not able to move bowels)  . Diarrhea (loose bowel movements)  . Fever  . Tiredness  . Decreased appetite (decreased hunger)  . Effects on the nerves are called peripheral neuropathy. You may feel numbness, tingling, or pain in your hands and feet. It may be hard for you to button your clothes, open jars, or walk as usual. The effect on the nerves may get worse with more doses of the drug. These effects get better in some people after the drug is stopped but it does  not get better in all people.  . Rash  Note: Each of the side effects above was reported in 20% or greater of patients treated with bortezomib. Not all possible side effects are included above.  Warnings and Precautions  . Severe peripheral neuropathy  . Low blood pressure  . Congestive heart failure - your heart has less ability to pump blood properly.  . Trouble breathing because of fluid build-up and/or inflammation in your lungs  . Nausea, vomiting, diarrhea and constipation which sometimes requires treatment to help lessen these side effects. There is also an increased risk of developing a partial or complete blockage of your small and/or large intestine.  . Changes in your central nervous system can happen. The central nervous system is made up of your brain and spinal cord. You could feel extreme tiredness, agitation, confusion, have hallucinations (see or hear things that are not there), trouble understanding or speaking, loss of control of your bowels or bladder, eyesight changes, numbness or lack of strength to your arms, legs, face, or body, seizures or coma. If you start to have any of these symptoms let your doctor know right away.  . Tumor lysis syndrome: This drug may act on the cancer cells very quickly. This may affect how your kidneys work.  . Changes in your liver function   Increased risk of a syndrome that affects your red blood cells, platelets and blood vessels in your kidneys, which can cause kidney  failure and be life-threatening.  Important Information  . This drug may be present in the saliva, tears, sweat, urine, stool, vomit, semen, and vaginal secretions. Talk to your doctor and/or your nurse about the necessary precautions to take during this time.  . This drug may impair your ability to drive or use machinery. Use caution and tell your nurse or doctor if you feel dizzy, very sleepy, and/or experience low blood pressure.  Treating Side Effects  .  Manage tiredness by pacing your activities for the day.  . Be sure to include periods of rest between energy-draining activities.  . To decrease the risk of infection, wash your hands regularly.  . Avoid close contact with people who have a cold, the flu, or other infections.  . Take your temperature as your doctor or nurse tells you, and whenever you feel like you may have a fever.  . To help decrease the risk of bleeding, use a soft toothbrush. Check with your nurse before using dental floss.  . Be very careful when using knives or tools.  . Use an electric shaver instead of a razor.  . Ask your doctor or nurse about medicines that are available to help stop or lessen constipation.  . If you are not able to move your bowels, check with your doctor or nurse before you use enemas, laxatives, or suppositories.  . Drink plenty of fluids (a minimum of eight glasses per day is recommended).  . If you throw up or have loose bowel movements, you should drink more fluids so that you do not become dehydrated (lack of water in the body from losing too much fluid).  . If you have diarrhea, eat low-fiber foods that are high in protein and calories and avoid foods that can irritate your digestive tracts or lead to cramping.  . Ask your nurse or doctor about medicine that can lessen or stop your diarrhea.  . To help with nausea and vomiting, eat small, frequent meals instead of three large meals a day. Choose foods and drinks that are at room temperature. Ask your nurse or doctor about other helpful tips and medicine that is available to help stop or lessen these symptoms.  . To help with decreased appetite, eat foods high in calories and protein, such as meat, poultry, fish, dry beans, tofu, eggs, nuts, milk, yogurt, cheese, ice cream, pudding, and nutritional supplements.  . Consider using sauces and spices to increase taste. Daily exercise, with your doctor's approval, may increase your  appetite.  . If you have numbness and tingling in your hands and feet, be careful when cooking, walking, and handling sharp objects and hot liquids.  . If you get a rash do not put anything on it unless your doctor or nurse says you may. Keep the area around the rash clean and dry. Ask your doctor for medicine if your rash bothers you.  Food and Drug Interactions  . This drug may interact with grapefruit and grapefruit juice. Talk to your doctor as this could make side effects worse.  . Check with your doctor or pharmacist about all other prescription medicines and over-the-counter medicines and dietary supplements (vitamins, minerals, herbs and others) you are taking before starting this medicine as there are known drug interactions with bortezomib. Also, check with your doctor or pharmacist before starting any new prescription or over-the-counter medicines, or dietary supplements to make sure that there are no interactions.  . Avoid the use of St. John's Wort with bortezomib  as this may lower the levels of the drug in your body, which can make it less effective.  When to Call the Doctor  Call your doctor or nurse if you have any of these symptoms and/or any new or unusual symptoms:  . Fever of 100.4 F (38 C) or higher  . Chills  . Tiredness that interferes with your daily activities  . Feeling dizzy or lightheaded  . Feeling that your heart is beating in a fast or not normal way (palpitations)  . Cough  . Wheezing or trouble breathing  . Easy bleeding or bruising  . Confusion and/or agitation  . Hallucinations  . Trouble understanding or speaking  . Blurry vision or changes in your eyesight  . Numbness or lack of strength to your arms, legs, face, or body  . Symptoms of a seizure such as confusion, blacking out, passing out, loss of hearing or vision, blurred vision, unusual smells or tastes (such as burning rubber), trouble talking, tremors or shaking in parts or all  of the body, repeated body movements, tense muscles that do not relax, and loss of control of urine and bowels. If you or your family member suspects you are having a seizure, call 911 right away.  . Nausea that stops you from eating or drinking and/or is not relieved by prescribed medicines  . Throwing up more than 3 times a day  . Lasting loss of appetite or rapid weight loss of five pounds in a week  . No bowel movement in 3 days or when you feel uncomfortable.  . Abdominal pain that does not go away  . Diarrhea, 4 times in one day or diarrhea with lack of strength or a feeling of being dizzy  . Numbness, tingling, or pain your hands and feet  . Swelling of legs, ankles, and/or feet  . Weight gain of 5 pounds in one week (fluid retention)  . Decreased urine, or very dark urine  . New rash and/or itching  . Rash that is not relieved by prescribed medicines  . Signs of tumor lysis: Confusion or agitation, decreased urine, nausea/vomiting, diarrhea, muscle cramping, numbness and/or tingling, seizures.  . Signs of possible liver problems: dark urine, pale bowel movements, bad stomach pain, feeling very tired and weak, unusual itching, or yellowing of the eyes or skin  . If you think you are pregnant or may have impregnated your partner  Reproduction Warnings  . Pregnancy warning: This drug can have harmful effects on the unborn baby. Women of child bearing potential should use effective methods of birth control during your cancer treatment and for at least 7 months after treatment. Men with male partners of childbearing potential should use effective methods of birth control during your cancer treatment and for at least 4 months after your cancer treatment. Let your doctor know right away if you think you may be pregnant or may have impregnated your partner.  . Breastfeeding warning: Women should not breastfeed during treatment and for 2 months month after treatment because this  drug could enter the breast milk and cause harm to a breastfeeding baby.  . Fertility warning: In men and women both, this drug may affect your ability to have children in the future. Talk with your doctor or nurse if you plan to have children. Ask for information on sperm or egg banking.   Dexamethasone (Decadron)  About This Drug  Dexamethasone is used to treat cancer, to decrease inflammation and sometimes used before and after  chemotherapy to prevent or treat nausea and/or vomiting. It is given in the vein (IV) or orally (by mouth).  Possible Side Effects  . Headache  . High blood pressure  . Abnormal heart beat  . Tiredness and weakness  . Changes in mood, which may include depression or a feeling of extreme well-being  . Trouble sleeping  . Increased sweating  . Increased appetite (increased hunger)  . Weight gain  . Increase risk of infections  . Pain in your abdomen  . Nausea  . Skin changes such as rash, dryness, redness  . Blood sugar levels may change  . Electrolyte changes  . Swelling of your legs, ankles and/or feet  . Changes in your liver function  . You may be at risk for cataracts, glaucoma or infections of the eye  . Muscle loss and / or weakness (lack of muscle strength)  . Increased risk of developing osteoporosis- your bones may become weak and brittle  Note: Not all possible side effects are included above.  Warnings and Precautions  . This drug may cause you to feel irritable, nervous or restless.  . Allergic reactions, including anaphylaxis are rare but may happen in some patients. Signs of allergic reaction to this drug may be swelling of the face, feeling like your tongue or throat are swelling, trouble breathing, rash, itching, fever, chills, feeling dizzy, and/or feeling that your heart is beating in a fast or not normal way. If this happens, do not take another dose of this drug. You should get urgent medical treatment.  .  High blood pressure and changes in electrolytes, which can cause fluid build-up around your heart, lungs or elsewhere.  . Increased risk of developing a hole in your stomach, small, and/or large intestine if you have ulcers in the lining of your stomach and/or intestine, or have diverticulitis, ulcerative colitis and/or other diseases that affect the gastrointestinal tract.  . Effects on the endocrine glands including the pituitary, adrenals or thyroid during or after use of this medication.  . Changes in the tissue of the heart, that can cause your heart to have less ability to pump blood. You may be short of breath or our arms, hands, legs and feet may swell.  . Increased risk of heart attack.  . Severe depression and other psychiatric disorders such as mood changes.  . Burning, pain and itching around your anus may happen when this drug is given in the vein too rapidly (IV). It usually happens suddenly and resolves in less than 1 minute.  Important Information  . Talk to your doctor or your nurse before stopping this medication, it should be stopped gradually. Depending on the dose and length of treatment, you could experience serious side effects if stopped abruptly (suddenly).  . Talk to your doctor before receiving any vaccinations during your treatment. Some vaccinations are not recommended while receiving dexamethasone.  How to Take Your Medication  . For Oral (by mouth): You can take the medicine with or without food. If you have nausea or upset stomach, take it with food.  . Missed dose: If you miss a dose, do not take 2 doses at the same time or extra doses. . If you vomit a dose, take your next dose at the regular time. Do not take 2 doses at the same time  . Handling: Wash your hands after handling your medicine, your caretakers should not handle your medicine with bare hands and should wear latex gloves.  . Storage:  Store this medicine in the original container at room  temperature. Protect from moisture and light. Discuss with your nurse or your doctor how to dispose of unused medicine.  Treating Side Effects  . Drink plenty of fluids (a minimum of eight glasses per day is recommended).  . To help with nausea and vomiting, eat small, frequent meals instead of three large meals a day. Choose foods and drinks that are at room temperature. Ask your nurse or doctor about other helpful tips and medicine that is available to help stop or lessen these symptoms.  . If you throw up, you should drink more fluids so that you do not become dehydrated (lack of water in the body from losing too much fluid).  . Manage tiredness by pacing your activities for the day.  . Be sure to include periods of rest between energy-draining activities.  . To help with muscle weakness, get regular exercise. If you feel too tired to exercise vigorously, try taking a short walk.  . If you are having trouble sleeping, talk to your nurse or doctor on tips to help you sleep better.  . If you are feeling depressed, talk to your nurse or doctor about it.  [name] Keeping your pain under control is important to your well-being. Please tell your doctor or nurse if you are experiencing pain.  . If you have diabetes, keep good control of your blood sugar level. Tell your nurse or your doctor if your glucose levels are higher or lower than normal.  . To decrease the risk of infection, wash your hands regularly.  . Avoid close contact with people who have a cold, the flu, or other infections.  . Take your temperature as your doctor or nurse tells you, and whenever you feel like you may have a fever.  . If you get a rash do not put anything on it unless your doctor or nurse says you may. Keep the area around the rash clean and dry. Ask your doctor for medicine if your rash bothers you.  . Moisturize your skin several times day.  . Avoid sun exposure and apply sunscreen routinely when  outdoors.  Food and Drug Interactions  . There are no known interactions of dexamethasone with food.  . Check with your doctor or pharmacist about all other prescription medicines and over-the-counter medicines and dietary supplements (vitamins, minerals, herbs and others) you are taking before starting this medicine as there are known drug interactions with dexamethasone. Also, check with your doctor or pharmacist before starting any new prescription or over-the-counter medicines, or dietary supplement to make sure that there are no interactions.  . There are known interactions of dexamethasone with other medicines and products like acetaminophen, aspirin, and ibuprofen. Ask your doctor what over-the-counter (OTC) medicines you can take.  When to Call the Doctor  Call your doctor or nurse if you have any of these symptoms and/or any new or unusual symptoms:  . Fever of 100.4 F (38 C) or higher  . Chills  . A headache that does not go away  . Trouble breathing  . Blurry vision or other changes in eyesight  . Feel irritable, nervous or restless  . Trouble falling or staying asleep  . Severe mood changes such as depression or unusual thoughts and/or behaviors  . Thoughts of hurting yourself or others, and suicide  . Tiredness that interferes with your daily activities  . Feeling that your heart is beating in a fast, slow or not  normal way  . Feeling dizzy or lightheaded  . Chest pain or symptoms of a heart attack. Most heart attacks involve pain in the center of the chest that lasts more than a few minutes. The pain may go away and come back, or it can be constant. It can feel like pressure, squeezing, fullness, or pain. Sometimes pain is felt in one or both arms, the back, neck, jaw, or stomach. If any of these symptoms last 2 minutes, call 911.  [name] Heartburn or indigestion  . Nausea that stops you from eating or drinking and/or is not relieved by prescribed medicines  .  Throwing up  . Pain in your abdomen that does not go away  . Abnormal blood sugar  . Unusual thirst, passing urine often, headache, sweating, shakiness, irritability  . Swelling of legs, ankles, or feet  . Weight gain of 5 pounds in one week (fluid retention)  . Signs of possible liver problems: dark urine, pale bowel movements, bad stomach pain, feeling very tired and weak, unusual itching, or yellowing of the eyes or skin  . Severe muscle weakness  . A new rash or a rash that is not relieved by prescribed medicines  . Signs of allergic reaction: swelling of the face, feeling like your tongue or throat are swelling, trouble breathing, rash, itching, fever, chills, feeling dizzy, and/or feeling that your heart is beating in a fast or not normal way. If this happens, call 911 for emergency care.  . If you think you may be pregnant  Reproduction Warnings  . Pregnancy warning: It is not known if this drug may harm an unborn child. For this reason, be sure to talk with your doctor if you are pregnant or planning to become pregnant while receiving this drug. Let your doctor know right away if you think you may be pregnant or may have impregnated your partner.  . Breastfeeding warning: It is not known if this drug passes into breast milk. For this reason, women should talk to their doctor about the risks and benefits of breastfeeding during treatment with this drug because this drug may enter the breast milk and cause harm to a breastfeeding baby.  . Fertility warning: Human fertility studies have not been done with this drug. Talk with your doctor or nurse if you plan to have children. Ask for information on sperm banking.   Cyclophosphamide (Generic Name) Other Names: Cytoxan, Neosar  About This Drug Cyclophosphamide is a drug used to treat cancer. It is given in the vein (IV).  Takes 30 minutes for this drug to infuse.  Possible Side Effects (More Common) . Nausea and throwing up  (vomiting). These symptoms may happen within a few hours after your treatment and may last up to 72 hours. Medicines are available to stop or lessen these side effects.  . Bone marrow depression. This is a decrease in the number of white blood cells, red blood cells, and platelets. This may raise your risk of infection, make you tired and weak (fatigue), and raise your risk of bleeding.  . Hair loss: You may notice hair getting thin. Some patients lose their hair. Hair loss is often complete scalp hair loss and can involve loss of eyebrows, eyelashes, and pubic hair. You may notice this a few days or weeks after treatment has started. Most often hair loss is temporary; your hair should grow back when treatment is done.  . Decreased appetite (decreased hunger)  . Blurred vision  . Soreness of  the mouth and throat. You may have red areas, white patches, or sores that hurt.  . Effects on the bladder. This drug may cause irritation and bleeding in the bladder. You may have blood in your urine. To help stop this, you will get extra fluids to help you pass more urine. You may get a drug called mesna, which helps to decrease irritation and bleeding. You may also get a medicine to help you pass more urine. You may have a catheter (tube) placed in your bladder so that your bladder will be washed with this drug.  Possible Side Effects (Less Common) . Darkening of the skin or nails  . Metallic taste in the mouth  . Changes in lung tissue may happen with large amounts of this drug. These changes may not last forever, and your lung tissue may go back to normal. Sometimes these changes may not be seen for many years. You may get a cough or have trouble catching your breath.  Allergic Reactions   Serious allergic reactions including anaphylaxis are rare. While you are getting this drug in your vein (IV), tell your nurse right away if you have any of these symptoms of an allergic reaction:  . Trouble  catching your breath  . Feeling like your tongue or throat are swelling  . Feeling your heart beat quickly or in a not normal way (palpitations)  . Feeling dizzy or lightheaded  . Flushing, itching, rash, and/or hives  Treating Side Effects  . Drink 6-8 cups of fluids each day unless your doctor has told you to limit your fluid intake due to some other health problem. A cup is 8 ounces of fluid. If you throw up or have loose bowel movements you should drink more fluids so that you do not become dehydrated (lack water in the body due to losing too much fluid).  . Ask your doctor or nurse about medicine that is available to help stop or lessen nausea or throwing up.  . Mouth care is very important. Your mouth care should consist of routine, gentle cleaning of your teeth or dentures and rinsing your mouth with a mixture of 1/2 teaspoon of salt in 8 ounces of water or  teaspoon of baking soda in 8 ounces of water. This should be done at least after each meal and at bedtime.  . If you have mouth sores, avoid mouthwash that has alcohol. Also avoid alcohol and smoking because they can bother your mouth and throat.  . Talk with your nurse about getting a wig before you lose your hair. Also, call the [place] at 800-ACS-2345 to find out information about the " Look Good.Marland KitchenMarland KitchenFeel Better" program close to where you live. It is a free program where women undergoing chemotherapy learn about wigs, turbans and scarves as well as makeup techniques and skin and nail care.  Important Information  . Whenever you tell a doctor or nurse your health history, always tell them that you have received cyclophosphamide in the past.  . If you take this drug by mouth swallow the medicine whole. Do not chew, break or crush it.  . You can take the medicine with or without food. If you have nausea, take it with food. Do not take the pills at bedtime.  Food and Drug Interactions There are no known  interactions of cyclophosphamide with food. This drug may interact with other medicines. Tell your doctor and pharmacist about all the medicines and dietary supplements (vitamins, minerals, herbs  and others) that you are taking at this time. The safety and use of dietary supplements and alternative diets are often not known. Using these might affect your cancer or interfere with your treatment. Until more is known, you should not use dietary supplements or alternative diets without your cancer doctor's help.  When to Call the Doctor Call your doctor or nurse right away if you have any of these symptoms:  . Fever of 100.4 F (38 C) or higher  . Chills  . Bleeding or bruising that is not normal  . Blurred vision or other changes in eyesight  . Pain when passing urine; blood in urine  . Pain in your lower back or side  . Wheezing or trouble breathing  . Swelling of legs, ankles, or feet  . Feeling dizzy or lightheaded  . Feeling confused or agitated  . Signs of liver problems: dark urine, pale bowel movements, bad stomach pain, feeling very tired and weak, unusual itching, or yellowing of the eyes or skin  . Unusual thirst or passing urine often  . Nausea that stops you from eating or drinking  . Throwing up  Call your doctor or nurse as soon as possible if any of these symptoms happen:  . Pain in your mouth or throat that makes it hard to eat or drink  . Nausea not relieved by prescribed medicines   Sexual Problems and Reproductive Concerns . Infertility warning: Sexual problems and reproduction concerns may happen. In both men and women, this drug may affect your ability to have children. This cannot be determined before your treatment. Talk with your doctor or nurse if you plan to have children. Ask for information on sperm or egg banking.  . In men, this drug may interfere with your ability to make sperm, but it should not change your ability to have sexual relations.  .  In women, menstrual bleeding may become irregular or stop while you are getting this drug. Do not assume that you cannot become pregnant if you do not have a menstrual period.  . Women may go through signs of menopause (change of life) like vaginal dryness or itching. Vaginal lubricants can be used to lessen vaginal dryness, itching, and pain during sexual relations.  . Genetic counseling is available for you to talk about the effects of this drug therapy on future pregnancies. Also, a genetic counselor can look at the possible risk of problems in the unborn baby due to this medicine if an exposure happens during pregnancy.  . Pregnancy warning: This drug may have harmful effects on the unborn child, so effective methods of birth control should be used during your cancer treatment.  . Breast feeding warning: Women should not breast feed during treatment because this drug could enter the breast milk and badly harm a breast feeding baby   [place]:  Hydration Increase your fluid intake 48 hours prior to treatment and drink at least 8 to 12 cups (64 ounces) of water/decaffeinated beverages per day after treatment. You can still have your cup of coffee or soda but these beverages do not count as part of your 8 to 12 cups that you need to drink daily. No alcohol intake.  Medications Continue taking your normal prescription medication as prescribed.  If you start any new herbal or new supplements please let us know first to make sure it is safe.  Mouth Care Have teeth cleaned professionally before starting treatment. Keep dentures and partial  plates clean. Use soft toothbrush and do not use mouthwashes that contain alcohol. Biotene is a good mouthwash that is available at most pharmacies or may be ordered by calling [phone]. Use warm salt water gargles (1 teaspoon salt per 1 quart warm water) before and after meals and at bedtime. If you need dental work,  please let the doctor know before you go for your appointment so that we can coordinate the best possible time for you in regards to your chemo regimen. You need to also let your dentist know that you are actively taking chemo. We may need to do labs prior to your dental appointment.  Skin Care Always use sunscreen that has not expired and with SPF (Sun Protection Factor) of 50 or higher. Wear hats to protect your head from the sun. Remember to use sunscreen on your hands, ears, face, & feet.  Use good moisturizing lotions such as udder cream, eucerin, or even Vaseline. Some chemotherapies can cause dry skin, color changes in your skin and nails.    . Avoid long, hot showers or baths. . Use gentle, fragrance-free soaps and laundry detergent. . Use moisturizers, preferably creams or ointments rather than lotions because the thicker consistency is better at preventing skin dehydration. Apply the cream or ointment within 15 minutes of showering. Reapply moisturizer at night, and moisturize your hands every time after you wash them.  Hair Loss (if your doctor says your hair will fall out)  . If your doctor says that your hair is likely to fall out, decide before you begin chemo whether you want to wear a wig. You may want to shop before treatment to match your hair color. . Hats, turbans, and scarves can also camouflage hair loss, although some people prefer to leave their heads uncovered. If you go bare-headed outdoors, be sure to use sunscreen on your scalp. . Cut your hair short. It eases the inconvenience of shedding lots of hair, but it also can reduce the emotional impact of watching your hair fall out. . Don't perm or color your hair during chemotherapy. Those chemical treatments are already damaging to hair and can enhance hair loss. Once your chemo treatments are done and your hair has grown back, it's OK to resume dyeing or perming hair.  With chemotherapy, hair loss is almost always temporary.  But when it grows back, it may be a different color or texture. In older adults who still had hair color before chemotherapy, the new growth may be completely gray.  Often, new hair is very fine and soft.  Infection Prevention Please wash your hands for at least 30 seconds using warm soapy water. Handwashing is the #1 way to prevent the spread of germs. Stay away from sick people or people who are getting over a cold. If you develop respiratory systems such as green/yellow mucus production or productive cough or persistent cough let us know and we will see if you need an antibiotic. It is a good idea to keep a pair of gloves on when going into grocery stores/[employer] to decrease your risk of coming into contact with germs on the carts, etc. Carry alcohol hand gel with you at all times and use it frequently if out in public. If your temperature reaches 100.4 or higher please call the clinic and let us know.  If it is after hours or on the weekend please go to the ER if your temperature is over 100.4.  Please have your own personal thermometer at  home to use.    Sex and bodily fluids If you are going to have sex, a condom must be used to protect the person that isn't taking chemotherapy. Chemo can decrease your libido (sex drive). For a few days after chemotherapy, chemotherapy can be excreted through your bodily fluids.  When using the toilet please close the lid and flush the toilet twice.  Do this for a few day after you have had chemotherapy.   Effects of chemotherapy on your sex life Some changes are simple and won't last long. They won't affect your sex life permanently.  Sometimes you may feel: . too tired . not strong enough to be very active . sick or sore  . not in the mood . anxious or low  Your anxiety might not seem related to sex. For example, you may be worried about the cancer and how your treatment is going. Or you may be worried about money, or about how you family are coping with  your illness.  These things can cause stress, which can affect your interest in sex. It's important to talk to your partner about how you feel.  Remember - the changes to your sex life don't usually last long. There's usually no medical reason to stop having sex during chemo. The drugs won't have any long term physical effects on your performance or enjoyment of sex. Cancer can't be passed on to your partner during sex  Contraception It's important to use reliable contraception during treatment. Avoid getting pregnant while you or your partner are having chemotherapy. This is because the drugs may harm the baby. Sometimes chemotherapy drugs can leave a man or woman infertile.  This means you would not be able to have children in the future. You might want to talk to someone about permanent infertility. It can be very difficult to learn that you may no longer be able to have children. Some people find counselling helpful. There might be ways to preserve your fertility, although this is easier for men than for women. You may want to speak to a fertility expert. You can talk about sperm banking or harvesting your eggs. You can also ask about other fertility options, such as donor eggs. If you have or have had breast cancer, your doctor might advise you not to take the contraceptive pill. This is because the hormones in it might affect the cancer. It is not known for sure whether or not chemotherapy drugs can be passed on through semen or secretions from the vagina. Because of this some doctors advise people to use a barrier method if you have sex during treatment. This applies to vaginal, anal or oral sex. Generally, doctors advise a barrier method only for the time you are actually having the treatment and for about a week after your treatment. Advice like this can be worrying, but this does not mean that you have to avoid being intimate with your partner. You can still have close contact with your partner and  continue to enjoy sex.  Animals If you have cats or birds we just ask that you not change the litter or change the cage.  Please have someone else do this for you while you are on chemotherapy.   Food Safety During and After Cancer Treatment Food safety is important for people both during and after cancer treatment. Cancer and cancer treatments, such as chemotherapy, radiation therapy, and stem cell/bone marrow transplantation, often weaken the immune system. This makes it harder for your  body to protect itself from foodborne illness, also called food poisoning. Foodborne illness is caused by eating food that contains harmful bacteria, parasites, or viruses.  Foods to avoid Some foods have a higher risk of becoming tainted with bacteria. These include: [name] Unwashed fresh fruit and vegetables, especially leafy vegetables that can hide dirt and other contaminants . Raw sprouts, such as alfalfa sprouts . Raw or undercooked beef, especially ground beef, or other raw or undercooked meat and poultry . Fatty, fried, or spicy foods immediately before or after treatment.  These can sit heavy on your stomach and make you feel nauseous. . Raw or undercooked shellfish, such as oysters. . Sushi and sashimi, which often contain raw fish.  . Unpasteurized beverages, such as unpasteurized fruit juices, raw milk, raw yogurt, or cider . Undercooked eggs, such as soft boiled, over easy, and poached; raw, unpasteurized eggs; or foods made with raw egg, such as homemade raw cookie dough and homemade mayonnaise  Simple steps for food safety  Shop smart. . Do not buy food stored or displayed in an unclean area. . Do not buy bruised or damaged fruits or vegetables. . Do not buy cans that have cracks, dents, or bulges. . Pick up foods that can spoil at the end of your shopping trip and store them in a cooler on the way home.  Prepare and clean up foods carefully. . Rinse all fresh fruits and vegetables under  running water, and dry them with a clean towel or paper towel. . Clean the top of cans before opening them. . After preparing food, wash your hands for 20 seconds with hot water and soap. Pay special attention to areas between fingers and under nails. . Clean your utensils and dishes with hot water and soap. [name] Disinfect your kitchen and cutting boards using 1 teaspoon of liquid, unscented bleach mixed into 1 quart of water.    Dispose of old food. . Eat canned and packaged food before its expiration date (the "use by" or "best before" date). . Consume refrigerated leftovers within 3 to 4 days. After that time, throw out the food. Even if the food does not smell or look spoiled, it still may be unsafe. Some bacteria, such as Listeria, can grow even on foods stored in the refrigerator if they are kept for too long.  Take precautions when eating out. . At restaurants, avoid buffets and salad bars where food sits out for a long time and comes in contact with many people. Food can become contaminated when someone with a virus, often a norovirus, or another "bug" handles it. . Put any leftover food in a "to-go" container yourself, rather than having the server do it. And, refrigerate leftovers as soon as you get home. . Choose restaurants that are clean and that are willing to prepare your food as you order it cooked.   AT HOME MEDICATIONS:  Compazine/Prochlorperazine 28m tablet. Take 1 tablet every 6 hours as needed for nausea/vomiting. (This can make you sleepy)   EMLA cream. Apply a quarter size amount to port site 1 hour prior to chemo. Do not rub in. Cover with plastic wrap.    Diarrhea Sheet   If you are having loose stools/diarrhea, please purchase Imodium and begin taking as outlined:  At the first sign of poorly formed or loose  stools you should begin taking Imodium (loperamide) 2 mg capsules.  Take two tablets (417m followed by one tablet (6m27mevery 2 hours - DO NOT EXCEED 8 tablets in 24 hours.  If it is bedtime and you are having loose stools, take 2 tablets at bedtime, then 2 tablets every 4 hours until morning.   Always call the [facility] you are having loose stools/diarrhea that you can't get under control.  Loose stools/diarrhea leads to dehydration (loss of water) in your body.  We have other options of trying to get the loose stools/diarrhea to stop but you must let us [place]!   Constipation Sheet  Colace - 100 mg capsules - take 2 capsules daily.  If this doesn't help then you can increase to 2 capsules twice daily.  Please call if the above does not work for you. Do not go more than 2 days without a bowel movement.  It is very important that you do not become constipated.  It will make you feel sick to your stomach (nausea) and can cause abdominal pain and vomiting.  Nausea Sheet   Compazine/Prochlorperazine 27m38mblet. Take 1 tablet every 6 hours as needed for nausea/vomiting (This can make you drowsy).  If you are having persistent nausea (nausea that does not stop) please call the [facility] let us [place] the amount of nausea that you are experiencing.  If you begin to vomit, you need to call the [facility] if it is the weekend and you have vomited more than one time and can't get it to stop-go to the Emergency Room.  Persistent nausea/vomiting can lead to dehydration (loss of fluid in your body) and will make you feel very weak and unwell. Ice chips, sips of clear liquids, foods that are at room temperature, crackers, and toast tend to be better tolerated.   SYMPTOMS TO REPORT AS SOON AS POSSIBLE AFTER TREATMENT:  FEVER GREATER THAN 100.4 F  CHILLS WITH OR WITHOUT FEVER  NAUSEA AND VOMITING THAT IS NOT CONTROLLED WITH YOUR NAUSEA MEDICATION  UNUSUAL SHORTNESS OF BREATH  UNUSUAL  BRUISING OR BLEEDING  TENDERNESS IN MOUTH AND THROAT WITH OR WITHOUT PRESENCE OF ULCERS  URINARY PROBLEMS  BOWEL PROBLEMS  UNUSUAL RASH      Wear comfortable clothing and clothing appropriate for easy access to any Portacath or PICC line. Let us [place] if there is anything that we can do to make your therapy better!    What to do if you need assistance after hours or on the weekends: CALL [phone] on the line, do not hang up.  You will hear multiple messages but at the end you will be connected with a nurse triage line.  They will contact the doctor if necessary.  Most of the time they will be able to assist you.  Do not call the hospital operator.      I have been informed and understand all of the instructions given to me and have received a copy. I have been instructed to call the clinic [phone] or my family physician as soon as possible for continued medical care, if indicated. I do not have any more questions at this time but understand that I may call the [facility] or the Patient Navigator at [phone] during office hours should I have questions or need assistance in obtaining follow-up care.

## 2021-03-13 ENCOUNTER — Other Ambulatory Visit: Payer: Self-pay

## 2021-03-13 ENCOUNTER — Inpatient Hospital Stay (HOSPITAL_COMMUNITY): Payer: Medicare HMO

## 2021-03-13 DIAGNOSIS — D472 Monoclonal gammopathy: Secondary | ICD-10-CM | POA: Diagnosis not present

## 2021-03-13 DIAGNOSIS — D631 Anemia in chronic kidney disease: Secondary | ICD-10-CM | POA: Diagnosis not present

## 2021-03-13 DIAGNOSIS — R809 Proteinuria, unspecified: Secondary | ICD-10-CM | POA: Diagnosis not present

## 2021-03-13 DIAGNOSIS — N189 Chronic kidney disease, unspecified: Secondary | ICD-10-CM | POA: Diagnosis not present

## 2021-03-13 DIAGNOSIS — C888 Other malignant immunoproliferative diseases: Secondary | ICD-10-CM | POA: Diagnosis not present

## 2021-03-13 DIAGNOSIS — I120 Hypertensive chronic kidney disease with stage 5 chronic kidney disease or end stage renal disease: Secondary | ICD-10-CM | POA: Diagnosis not present

## 2021-03-13 DIAGNOSIS — E211 Secondary hyperparathyroidism, not elsewhere classified: Secondary | ICD-10-CM | POA: Diagnosis not present

## 2021-03-13 DIAGNOSIS — Z5111 Encounter for antineoplastic chemotherapy: Secondary | ICD-10-CM | POA: Diagnosis not present

## 2021-03-13 DIAGNOSIS — Z5112 Encounter for antineoplastic immunotherapy: Secondary | ICD-10-CM | POA: Diagnosis not present

## 2021-03-13 DIAGNOSIS — N185 Chronic kidney disease, stage 5: Secondary | ICD-10-CM | POA: Diagnosis not present

## 2021-03-13 DIAGNOSIS — E872 Acidosis: Secondary | ICD-10-CM | POA: Diagnosis not present

## 2021-03-13 DIAGNOSIS — C9000 Multiple myeloma not having achieved remission: Secondary | ICD-10-CM | POA: Diagnosis not present

## 2021-03-13 MED ORDER — PROCHLORPERAZINE MALEATE 10 MG PO TABS: 10.0000 mg | ORAL_TABLET | Freq: Four times a day (QID) | ORAL | 3 refills | Status: DC | PRN

## 2021-03-13 NOTE — Progress Notes (Signed)
Pharmacist Chemotherapy Monitoring - Initial Assessment    Anticipated start date: 03/21/21   Regimen:  . Are orders appropriate based on the patient's diagnosis, regimen, and cycle? Yes . Does the plan date match the patient's scheduled date? Yes . Is the sequencing of drugs appropriate? Yes . Are the premedications appropriate for the patient's regimen? Yes . Prior Authorization for treatment is: Approved o If applicable, is the correct biosimilar selected based on the patient's insurance? not applicable  Organ Function and Labs: [name] Are dose adjustments needed based on the patient's renal function, hepatic function, or hematologic function? No . Are appropriate labs ordered prior to the start of patient's treatment? Yes . Other organ system assessment, if indicated: N/A . The following baseline labs, if indicated, have been ordered: N/A  Dose Assessment: . Are the drug doses appropriate? yes . Are the following correct: o Drug concentrations Yes o IV fluid compatible with drug Yes o Administration routes Yes o Timing of therapy Yes . If applicable, does the patient have documented access for treatment and/or plans for port-a-cath placement? no . If applicable, have lifetime cumulative doses been properly documented and assessed? not applicable Lifetime Dose Tracking  No doses have been documented on this patient for the following tracked chemicals: Doxorubicin, Epirubicin, Idarubicin, Daunorubicin, Mitoxantrone, Bleomycin, Oxaliplatin, Carboplatin, Liposomal Doxorubicin  o   Toxicity Monitoring/Prevention: . The patient has the following take home antiemetics prescribed: Prochlorperazine . The patient has the following take home medications prescribed: Needs antiviral sent to pharmacy . Medication allergies and previous infusion related reactions, if applicable, have been reviewed and addressed. Yes . The patient's current medication list has been assessed for drug-drug  interactions with their chemotherapy regimen. no significant drug-drug interactions were identified on review.  Order Review: . Are the treatment plan orders signed? No . Is the patient scheduled to see a provider prior to their treatment? Yes  I verify that I have reviewed each item in the above checklist and answered each question accordingly.  Wynona Neat, [place], 03/13/2021  4:11 PM

## 2021-03-13 NOTE — Progress Notes (Signed)

## 2021-03-14 ENCOUNTER — Inpatient Hospital Stay (HOSPITAL_COMMUNITY): Payer: Medicare HMO

## 2021-03-14 VITALS — BP 122/69 | HR 74 | Temp 97.1°F | Resp 18 | Wt 234.7 lb

## 2021-03-14 DIAGNOSIS — Z5112 Encounter for antineoplastic immunotherapy: Secondary | ICD-10-CM | POA: Diagnosis not present

## 2021-03-14 DIAGNOSIS — C9000 Multiple myeloma not having achieved remission: Secondary | ICD-10-CM

## 2021-03-14 DIAGNOSIS — Z5111 Encounter for antineoplastic chemotherapy: Secondary | ICD-10-CM | POA: Diagnosis not present

## 2021-03-14 LAB — CBC WITH DIFFERENTIAL/PLATELET
Abs Immature Granulocytes: 0.08 10*3/uL — ABNORMAL HIGH (ref 0.00–0.07)
Basophils Absolute: 0.1 10*3/uL (ref 0.0–0.1)
Basophils Relative: 1 %
Eosinophils Absolute: 0.2 10*3/uL (ref 0.0–0.5)
Eosinophils Relative: 2 %
HCT: 35.2 % — ABNORMAL LOW (ref 39.0–52.0)
Hemoglobin: 11.5 g/dL — ABNORMAL LOW (ref 13.0–17.0)
Immature Granulocytes: 1 %
Lymphocytes Relative: 51 %
Lymphs Abs: 5.7 10*3/uL — ABNORMAL HIGH (ref 0.7–4.0)
MCH: 31.5 pg (ref 26.0–34.0)
MCHC: 32.7 g/dL (ref 30.0–36.0)
MCV: 96.4 fL (ref 80.0–100.0)
Monocytes Absolute: 1 10*3/uL (ref 0.1–1.0)
Monocytes Relative: 9 %
Neutro Abs: 4 10*3/uL (ref 1.7–7.7)
Neutrophils Relative %: 36 %
Platelets: 227 10*3/uL (ref 150–400)
RBC: 3.65 MIL/uL — ABNORMAL LOW (ref 4.22–5.81)
RDW: 14.6 % (ref 11.5–15.5)
WBC: 11.1 10*3/uL — ABNORMAL HIGH (ref 4.0–10.5)
nRBC: 0 % (ref 0.0–0.2)

## 2021-03-14 LAB — COMPREHENSIVE METABOLIC PANEL WITH GFR
ALT: 19 U/L (ref 0–44)
AST: 13 U/L — ABNORMAL LOW (ref 15–41)
Albumin: 3.5 g/dL (ref 3.5–5.0)
Alkaline Phosphatase: 27 U/L — ABNORMAL LOW (ref 38–126)
Anion gap: 8 (ref 5–15)
BUN: 40 mg/dL — ABNORMAL HIGH (ref 8–23)
CO2: 17 mmol/L — ABNORMAL LOW (ref 22–32)
Calcium: 8.9 mg/dL (ref 8.9–10.3)
Chloride: 108 mmol/L (ref 98–111)
Creatinine, Ser: 4.5 mg/dL — ABNORMAL HIGH (ref 0.61–1.24)
GFR, Estimated: 14 mL/min — ABNORMAL LOW
Glucose, Bld: 99 mg/dL (ref 70–99)
Potassium: 4.1 mmol/L (ref 3.5–5.1)
Sodium: 133 mmol/L — ABNORMAL LOW (ref 135–145)
Total Bilirubin: 0.7 mg/dL (ref 0.3–1.2)
Total Protein: 8.8 g/dL — ABNORMAL HIGH (ref 6.5–8.1)

## 2021-03-14 MED ORDER — HEPARIN SOD (PORK) LOCK FLUSH 100 UNIT/ML IV SOLN: 500.0000 [IU] | Freq: Once | INTRAVENOUS | Status: DC | PRN

## 2021-03-14 MED ORDER — SODIUM CHLORIDE 0.9% FLUSH: 10.0000 mL | INTRAVENOUS | Status: DC | PRN

## 2021-03-14 MED ORDER — SODIUM CHLORIDE 0.9 % IV SOLN
200.0000 mg/m2 | Freq: Once | INTRAVENOUS | Status: AC
  Administered 2021-03-14: 460 mg via INTRAVENOUS
  Filled 2021-03-14: qty 23

## 2021-03-14 MED ORDER — SODIUM CHLORIDE 0.9 % IV SOLN
40.0000 mg | Freq: Once | INTRAVENOUS | Status: AC
  Administered 2021-03-14: 40 mg via INTRAVENOUS
  Filled 2021-03-14: qty 4

## 2021-03-14 MED ORDER — PALONOSETRON HCL INJECTION 0.25 MG/5ML
0.2500 mg | Freq: Once | INTRAVENOUS | Status: AC
  Administered 2021-03-14: 0.25 mg via INTRAVENOUS
  Filled 2021-03-14: qty 5

## 2021-03-14 MED ORDER — SODIUM CHLORIDE 0.9 % IV SOLN: Freq: Once | INTRAVENOUS | Status: AC

## 2021-03-14 MED ORDER — BORTEZOMIB CHEMO SQ INJECTION 3.5 MG (2.5MG/ML)
1.5000 mg/m2 | Freq: Once | INTRAMUSCULAR | Status: AC
  Administered 2021-03-14: 3.5 mg via SUBCUTANEOUS
  Filled 2021-03-14: qty 1.4

## 2021-03-14 NOTE — Patient Instructions (Signed)
[facility]  Discharge Instructions: Thank you for choosing [facility] to provide your oncology and hematology care.  If you have a lab appointment with the Cancer Center, please come in thru the Main Entrance and check in at the main information desk.  Wear comfortable clothing and clothing appropriate for easy access to any Portacath or PICC line.   We strive to give you quality time with your provider. You may need to reschedule your appointment if you arrive late (15 or more minutes).  Arriving late affects you and other patients whose appointments are after yours.  Also, if you miss three or more appointments without notifying the office, you may be dismissed from the clinic at the provider's discretion.      For prescription refill requests, have your pharmacy contact our office and allow 72 hours for refills to be completed.        To help prevent nausea and vomiting after your treatment, we encourage you to take your nausea medication as directed.  BELOW ARE SYMPTOMS THAT SHOULD BE REPORTED IMMEDIATELY: *FEVER GREATER THAN 100.4 F (38 C) OR HIGHER *CHILLS OR SWEATING *NAUSEA AND VOMITING THAT IS NOT CONTROLLED WITH YOUR NAUSEA MEDICATION *UNUSUAL SHORTNESS OF BREATH *UNUSUAL BRUISING OR BLEEDING *URINARY PROBLEMS (pain or burning when urinating, or frequent urination) *BOWEL PROBLEMS (unusual diarrhea, constipation, pain near the anus) TENDERNESS IN MOUTH AND THROAT WITH OR WITHOUT PRESENCE OF ULCERS (sore throat, sores in mouth, or a toothache) UNUSUAL RASH, SWELLING OR PAIN  UNUSUAL VAGINAL DISCHARGE OR ITCHING   Items with * indicate a potential emergency and should be followed up as soon as possible or go to the Emergency Department if any problems should occur.  Please show the CHEMOTHERAPY ALERT CARD or IMMUNOTHERAPY ALERT CARD at check-in to the Emergency Department and triage nurse.  Should you have questions after your visit or need to cancel  or reschedule your appointment, please contact [facility] [phone]  and follow the prompts.  Office hours are 8:00 a.m. to 4:30 p.m. Monday - Friday. Please note that voicemails left after 4:00 p.m. may not be returned until the following business day.  We are closed weekends and major holidays. You have access to a nurse at all times for urgent questions. Please call the main number to the clinic [phone] and follow the prompts.  For any non-urgent questions, you may also contact your provider using MyChart. We now offer e-Visits for anyone 18 and older to request care online for non-urgent symptoms. For details visit [email or web address].   Also download the MyChart app! Go to the app store, search "MyChart", open the app, select [facility], and log in with your MyChart username and password.  Due to Covid, a mask is required upon entering the hospital/clinic. If you do not have a mask, one will be given to you upon arrival. For doctor visits, patients may have 1 support person aged 18 or older with them. For treatment visits, patients cannot have anyone with them due to current Covid guidelines and our immunocompromised population.  

## 2021-03-14 NOTE — Progress Notes (Signed)
Treatment given per orders. Patient tolerated it well without problems. Vitals stable and discharged home from clinic ambulatory. Follow up as scheduled.  

## 2021-03-15 ENCOUNTER — Telehealth (HOSPITAL_COMMUNITY): Payer: Self-pay

## 2021-03-15 LAB — UPEP/UIFE/LIGHT CHAINS/TP, 24-HR UR
% BETA, Urine: 20.5 %
ALPHA 1 URINE: 7.5 %
Albumin, U: 12.1 %
Alpha 2, Urine: 18.7 %
Free Kappa Lt Chains,Ur: 2047.38 mg/L — ABNORMAL HIGH (ref 1.17–86.46)
Free Kappa/Lambda Ratio: 41.88 — ABNORMAL HIGH (ref 1.83–14.26)
Free Lambda Lt Chains,Ur: 48.89 mg/L — ABNORMAL HIGH (ref 0.27–15.21)
GAMMA GLOBULIN URINE: 41.2 %
M-SPIKE %, Urine: 17.6 % — ABNORMAL HIGH
M-Spike, Mg/24 Hr: 400 mg/24 hr — ABNORMAL HIGH
Total Protein, Urine-Ur/day: 2270 mg/24 hr — ABNORMAL HIGH (ref 30–150)
Total Protein, Urine: 103.2 mg/dL
Total Volume: 2200

## 2021-03-15 NOTE — Telephone Encounter (Signed)
24 hour follow up call- left message for patient to call clinic if he has any questions or concerns.

## 2021-03-16 DIAGNOSIS — N185 Chronic kidney disease, stage 5: Secondary | ICD-10-CM | POA: Diagnosis not present

## 2021-03-16 DIAGNOSIS — D631 Anemia in chronic kidney disease: Secondary | ICD-10-CM | POA: Diagnosis not present

## 2021-03-16 DIAGNOSIS — I120 Hypertensive chronic kidney disease with stage 5 chronic kidney disease or end stage renal disease: Secondary | ICD-10-CM | POA: Diagnosis not present

## 2021-03-16 DIAGNOSIS — E872 Acidosis: Secondary | ICD-10-CM | POA: Diagnosis not present

## 2021-03-16 DIAGNOSIS — N08 Glomerular disorders in diseases classified elsewhere: Secondary | ICD-10-CM | POA: Diagnosis not present

## 2021-03-16 DIAGNOSIS — C9000 Multiple myeloma not having achieved remission: Secondary | ICD-10-CM | POA: Diagnosis not present

## 2021-03-16 DIAGNOSIS — N189 Chronic kidney disease, unspecified: Secondary | ICD-10-CM | POA: Diagnosis not present

## 2021-03-16 DIAGNOSIS — E211 Secondary hyperparathyroidism, not elsewhere classified: Secondary | ICD-10-CM | POA: Diagnosis not present

## 2021-03-16 DIAGNOSIS — R809 Proteinuria, unspecified: Secondary | ICD-10-CM | POA: Diagnosis not present

## 2021-03-19 ENCOUNTER — Other Ambulatory Visit: Payer: Self-pay

## 2021-03-19 ENCOUNTER — Ambulatory Visit (INDEPENDENT_AMBULATORY_CARE_PROVIDER_SITE_OTHER): Payer: Medicare HMO

## 2021-03-19 ENCOUNTER — Ambulatory Visit (INDEPENDENT_AMBULATORY_CARE_PROVIDER_SITE_OTHER): Payer: Medicare HMO | Admitting: Vascular Surgery

## 2021-03-19 ENCOUNTER — Encounter: Payer: Self-pay | Admitting: Vascular Surgery

## 2021-03-19 VITALS — BP 114/70 | HR 78 | Temp 98.2°F | Resp 18 | Ht 67.0 in | Wt 238.0 lb

## 2021-03-19 DIAGNOSIS — N184 Chronic kidney disease, stage 4 (severe): Secondary | ICD-10-CM | POA: Diagnosis not present

## 2021-03-19 NOTE — Progress Notes (Signed)
[facility] [place]  Patient name: Ernest Barnett MRN: [id number] DOB: [birth year] Sex: male  REASON FOR VISIT: Follow-up recent right radiocephalic AV fistula  Nephrologist: Wolfgang Phoenix  HPI: Ernest Barnett is a 65 y.o. male in today for follow-up of AV fistula creation at [facility] on 02/08/2021.  He has had no discomfort regarding his incision.  He does report occasional positional numbness in his right hand which is not limiting to him.  He has started chemotherapy for his multiple myeloma.  Current Outpatient Medications  Medication Sig Dispense Refill  . amLODipine (NORVASC) 5 MG tablet Take 5 mg by mouth daily.    [name] aspirin EC 81 MG tablet Take 1 tablet (81 mg total) by mouth daily. 30 tablet 11  . bortezomib IV (VELCADE) 3.5 MG injection Inject 1.5 mg/m2 into the vein once a week.    . calcitRIOL (ROCALTROL) 0.25 MCG capsule Take 0.25 mcg by mouth every Monday, Wednesday, and Friday.    . cholecalciferol (VITAMIN D3) 25 MCG (1000 UNIT) tablet Take 1,000 Units by mouth daily.    . CYCLOPHOSPHAMIDE IV Inject 300 mg/m2 into the vein once a week.    [name] dexamethasone 40 mg in sodium chloride 0.9 % 50 mL Inject 40 mg into the vein once a week.    . Multiple Vitamin (MULTIVITAMIN) tablet Take by mouth.    . Omega-3 Fatty Acids (FISH OIL) 1000 MG CAPS Take 1,000 mg by mouth in the morning, at noon, and at bedtime.     . pantoprazole (PROTONIX) 40 MG tablet Take 1 tablet (40 mg total) by mouth 2 (two) times daily. 180 tablet 3  . polyethylene glycol (MIRALAX / GLYCOLAX) packet Take 17 g by mouth daily. Patient states that he takes as needed. (Patient taking differently: Take 17 g by mouth daily as needed for mild constipation.) 30 each 5  . pravastatin (PRAVACHOL) 40 MG tablet Take 40 mg by mouth daily.    . prochlorperazine (COMPAZINE) 10 MG tablet Take 1 tablet (10 mg total) by mouth every 6 (six) hours as needed for nausea or  vomiting. 30 tablet 3  . sodium bicarbonate 650 MG tablet Take 1,300 mg by mouth 3 (three) times daily.    . tamsulosin (FLOMAX) 0.4 MG CAPS capsule Take 0.4 mg by mouth daily.      No current facility-administered medications for this visit.     PHYSICAL EXAM: Vitals:   03/19/21 0831  BP: 114/70  Pulse: 78  Resp: 18  Temp: 98.2 F (36.8 C)  TempSrc: Other (Comment)  SpO2: 96%  Weight: 238 lb (108 kg)  Height: 5\' 7"  (1.702 m)    GENERAL: The patient is a well-nourished male, in no acute distress. The vital signs are documented above. Right radiocephalic incision well-healed.  He does have an excellent thrill with good maturation of his cephalic vein.  The vein does course with outflow into the basilic vein on the more medial aspect of his antecubital space.  He does have a Palpable competing branch in the mid distal forearm that extends to the lateral aspect of his forearm  Duplex reveals good flow through his fistula with good [name] size maturation.  Several competing branches are seen.  MEDICAL ISSUES: Good initial result from his AV fistula.  He continues to not need hemodialysis.  He will continue to be followed.  I feel that he has a very high likelihood that this will be successful for dialysis.  I  did discuss the potential need to ligate competing branches if he has failure of maturation.  Explained that this would be done as an outpatient procedure.  I feel that there is a very high chance that this will not be required.  We will be available if he needs dialysis and has inadequate maturation of his fistula.  We will not schedule a follow-up unless needed   Larina Earthly, MD [facility] [phone]  Note: Portions of this report may have been transcribed using voice recognition software.  Every effort has been made to ensure accuracy; however, inadvertent computerized transcription errors may still be present.

## 2021-03-20 NOTE — Progress Notes (Signed)
[place], [place] [address]   CLINIC:  Medical Oncology/Hematology  PCP:  Celene Squibb, MD [address] [address] [phone]   REASON FOR VISIT:  Follow-up for MGUS  PRIOR THERAPY: none  NGS Results: not done  CURRENT THERAPY: CyBorD weekly  BRIEF ONCOLOGIC HISTORY:  Oncology History  Multiple myeloma ([place])  03/06/2021 Initial Diagnosis   Multiple myeloma ([place])   03/06/2021 Cancer Staging   Staging form: Plasma Cell Myeloma and Plasma Cell Disorders, AJCC 8th Edition - Clinical stage from 03/06/2021: RISS Stage II (Beta-2-microglobulin (mg/L): 5.5, Albumin (g/dL): 3.5, ISS: Stage III, High-risk cytogenetics: Absent, LDH: Normal) - Signed by Derek Jack, MD on 03/06/2021 Stage prefix: Initial diagnosis Beta 2 microglobulin range (mg/L): Greater than or equal to 5.5 Albumin range (g/dL): Greater than or equal to 3.5 Cytogenetics: No abnormalities Pretreatment monoclonal protein level in serum (M spike) (g/dL): 2 Pretreatment serum free kappa light chain level (g/L): 794 Pretreatment serum free lambda light chain level (g/L): 15.9   03/14/2021 -  Chemotherapy    Patient is on Treatment Plan: MULTIPLE MYELOMA CYBORD - WEEKLY BORTEZOMIB        CANCER STAGING: Cancer Staging Multiple myeloma ([place]) Staging form: Plasma Cell Myeloma and Plasma Cell Disorders, AJCC 8th Edition - Clinical stage from 03/06/2021: RISS Stage II (Beta-2-microglobulin (mg/L): 5.5, Albumin (g/dL): 3.5, ISS: Stage III, High-risk cytogenetics: Absent, LDH: Normal) - Signed by Derek Jack, MD on 03/06/2021   INTERVAL HISTORY:  [name], a 65 y.o. male, returns for routine follow-up and consideration for next cycle of chemotherapy. Ernest Barnett was last seen on 03/06/2021.  Due for day#8 cycle #1 of Velcade today.   Overall, he tells me he has been feeling pretty well.  He denies n/v/d or fatigue. He denies tingling, numbness,  and reports a good appetite. After receiving his previous injection he reports redness, but he denies sleep disturbance.   Overall, he feels ready for next cycle of chemo today.   REVIEW OF SYSTEMS:  Review of Systems  Constitutional: Positive for fatigue (75%). Negative for appetite change.  Gastrointestinal: Negative for diarrhea, nausea and vomiting.  Neurological: Negative for numbness.  All other systems reviewed and are negative.   PAST MEDICAL/SURGICAL HISTORY:  Past Medical History:  Diagnosis Date  . Chronic kidney disease   . COPD (chronic obstructive pulmonary disease) ([place])   . CVA (cerebral vascular accident) ([place]) 2013  . GERD (gastroesophageal reflux disease)   . HOH (hard of hearing)   . Hypercholesteremia   . Hypertension   . Stroke ([place]) 03/24/2012   left sided weakness  . Vitamin D deficiency    Past Surgical History:  Procedure Laterality Date  . AV FISTULA PLACEMENT Right 02/08/2021   Procedure: RIGHT ARM ARTERIOVENOUS (AV) FISTULA CREATION;  Surgeon: [name], MD;  Location: [facility];  Service: Vascular;  Laterality: Right;  . BIOPSY  11/12/2018   Procedure: BIOPSY;  Surgeon: Rogene Houston, MD;  Location: [facility];  Service: Endoscopy;;  colon  . BIOPSY  05/10/2020   Procedure: BIOPSY;  Surgeon: Rogene Houston, MD;  Location: [facility];  Service: Endoscopy;;  esophagus  . CATARACT EXTRACTION     right eye  . CATARACT EXTRACTION W/PHACO  10/08/2012   Procedure: CATARACT EXTRACTION PHACO AND INTRAOCULAR LENS PLACEMENT (IOC);  Surgeon: Tonny Branch, MD;  Location: [facility];  Service: Ophthalmology;  Laterality: Left;  CDE:6.64  .  CHOLECYSTECTOMY     [facility]  . COLONOSCOPY N/A 11/12/2018   Procedure: COLONOSCOPY;  Surgeon: Rogene Houston, MD;  Location: [facility];  Service: Endoscopy;  Laterality: N/A;  1030  . ELBOW FRACTURE SURGERY     left  . ESOPHAGEAL DILATION N/A 11/12/2018   Procedure: ESOPHAGEAL DILATION;  Surgeon: Rogene Houston, MD;   Location: [facility];  Service: Endoscopy;  Laterality: N/A;  . ESOPHAGEAL DILATION N/A 05/10/2020   Procedure: ESOPHAGEAL DILATION;  Surgeon: Rogene Houston, MD;  [facility];  Service: Endoscopy;  Laterality: N/A;  . ESOPHAGOGASTRODUODENOSCOPY N/A 11/12/2018   Procedure: ESOPHAGOGASTRODUODENOSCOPY (EGD);  Surgeon: Rogene Houston, MD;  [facility];  Service: Endoscopy;  Laterality: N/A;  . ESOPHAGOGASTRODUODENOSCOPY N/A 05/10/2020   Procedure: ESOPHAGOGASTRODUODENOSCOPY (EGD);  Surgeon: Rogene Houston, MD;  [facility];  Service: Endoscopy;  Laterality: N/A;  210  . HERNIA REPAIR     right inguinal  . HYDROCELE EXCISION / REPAIR    . POLYPECTOMY  11/12/2018   Procedure: POLYPECTOMY;  Surgeon: Rogene Houston, MD;  [facility];  Service: Endoscopy;;  colon   . RETINAL DETACHMENT SURGERY Left 2019  . SPLENECTOMY, TOTAL    . TONSILLECTOMY    . VOCAL CORD INJECTION     removal of polyp-2005    SOCIAL HISTORY:  Social History   Socioeconomic History  . Marital status: Significant Other    Spouse name: Not on file  . Number of children: Not on file  . Years of education: Not on file  . Highest education level: Not on file  Occupational History  . Not on file  Tobacco Use  . Smoking status: Former Smoker    Packs/day: 2.00    Years: 40.00    Pack years: 80.00    Types: Cigarettes, E-cigarettes  . Smokeless tobacco: Current User  . Tobacco comment: Pt only vap now  Vaping Use  . Vaping Use: Every day  . Substances: Nicotine  Substance and Sexual Activity  . Alcohol use: Yes    Comment: occassional  . Drug use: No  . Sexual activity: Yes    Birth control/protection: None  Other Topics Concern  . Not on file  Social History Narrative  . Not on file   Social Determinants of Health   Financial Resource Strain: Low Risk   . Difficulty of Paying Living Expenses: Not hard at all  Food Insecurity: No Food Insecurity  .  Worried About Charity fundraiser in the Last Year: Never true  . Ran Out of Food in the Last Year: Never true  Transportation Needs: No Transportation Needs  . Lack of Transportation (Medical): No  . Lack of Transportation (Non-Medical): No  Physical Activity: Insufficiently Active  . Days of Exercise per Week: 2 days  . Minutes of Exercise per Session: 20 min  Stress: No Stress Concern Present  . Feeling of Stress : Not at all  Social Connections: Moderately Isolated  . Frequency of Communication with Friends and Family: More than three times a week  . Frequency of Social Gatherings with Friends and Family: More than three times a week  . Attends Religious Services: 1 to 4 times per year  . Active Member of Clubs or Organizations: No  . Attends Archivist Meetings: Never  . Marital Status: Never married  Intimate Partner Violence: Not At Risk  . Fear of Current or Ex-Partner: No  . Emotionally  Abused: No  . Physically Abused: No  . Sexually Abused: No    FAMILY HISTORY:  No family history on file.  CURRENT MEDICATIONS:  Current Outpatient Medications  Medication Sig Dispense Refill  . amLODipine (NORVASC) 5 MG tablet Take 5 mg by mouth daily.    [name] aspirin EC 81 MG tablet Take 1 tablet (81 mg total) by mouth daily. 30 tablet 11  . bortezomib IV (VELCADE) 3.5 MG injection Inject 1.5 mg/m2 into the vein once a week.    . calcitRIOL (ROCALTROL) 0.25 MCG capsule Take 0.25 mcg by mouth every Monday, Wednesday, and Friday.    . cholecalciferol (VITAMIN D3) 25 MCG (1000 UNIT) tablet Take 1,000 Units by mouth daily.    . CYCLOPHOSPHAMIDE IV Inject 300 mg/m2 into the vein once a week.    [name] dexamethasone 40 mg in sodium chloride 0.9 % 50 mL Inject 40 mg into the vein once a week.    . Multiple Vitamin (MULTIVITAMIN) tablet Take by mouth.    . Omega-3 Fatty Acids (FISH OIL) 1000 MG CAPS Take 1,000 mg by mouth in the morning, at noon, and at bedtime.     . pantoprazole  (PROTONIX) 40 MG tablet Take 1 tablet (40 mg total) by mouth 2 (two) times daily. 180 tablet 3  . polyethylene glycol (MIRALAX / GLYCOLAX) packet Take 17 g by mouth daily. Patient states that he takes as needed. (Patient taking differently: Take 17 g by mouth daily as needed for mild constipation.) 30 each 5  . pravastatin (PRAVACHOL) 40 MG tablet Take 40 mg by mouth daily.    . prochlorperazine (COMPAZINE) 10 MG tablet Take 1 tablet (10 mg total) by mouth every 6 (six) hours as needed for nausea or vomiting. 30 tablet 3  . sodium bicarbonate 650 MG tablet Take 1,300 mg by mouth 3 (three) times daily.    . tamsulosin (FLOMAX) 0.4 MG CAPS capsule Take 0.4 mg by mouth daily.      No current facility-administered medications for this visit.    ALLERGIES:  No Known Allergies  PHYSICAL EXAM:  Performance status (ECOG): 1 - Symptomatic but completely ambulatory  There were no vitals filed for this visit. Wt Readings from Last 3 Encounters:  03/19/21 238 lb (108 kg)  03/14/21 234 lb 11.2 oz (106.5 kg)  03/06/21 240 lb 2 oz (108.9 kg)   Physical Exam Vitals reviewed.  Constitutional:      Appearance: Normal appearance.  Cardiovascular:     Rate and Rhythm: Normal rate and regular rhythm.     Pulses: Normal pulses.     Heart sounds: Normal heart sounds.  Pulmonary:     Effort: Pulmonary effort is normal.     Breath sounds: Normal breath sounds.  Musculoskeletal:     Right lower leg: No edema.     Left lower leg: No edema.  Neurological:     General: No focal deficit present.     Mental Status: He is alert and oriented to person, place, and time.  Psychiatric:        Mood and Affect: Mood normal.        Behavior: Behavior normal.     LABORATORY DATA:  I have reviewed the labs as listed.  CBC Latest Ref Rng & Units 03/14/2021 02/21/2021 02/08/2021  WBC 4.0 - 10.5 K/uL 11.1(H) 11.9(H) -  Hemoglobin 13.0 - 17.0 g/dL 11.5(L) 11.7(L) 12.9(L)  Hematocrit 39.0 - 52.0 % 35.2(L) 36.0(L)  38.0(L)  Platelets 150 - 400  K/uL 227 211 -   CMP Latest Ref Rng & Units 03/14/2021 02/08/2021 02/01/2021  Glucose 70 - 99 mg/dL 99 92 91  BUN 8 - 23 mg/dL 40(H) 37(H) 31(H)  Creatinine 0.61 - 1.24 mg/dL 4.50(H) 5.70(H) 4.81(H)  Sodium 135 - 145 mmol/L 133(L) 141 138  Potassium 3.5 - 5.1 mmol/L 4.1 4.3 4.5  Chloride 98 - 111 mmol/L 108 110 109  CO2 22 - 32 mmol/L 17(L) - 20(L)  Calcium 8.9 - 10.3 mg/dL 8.9 - 9.4  Total Protein 6.5 - 8.1 g/dL 8.8(H) - 8.9(H)  Total Bilirubin 0.3 - 1.2 mg/dL 0.7 - 0.6  Alkaline Phos 38 - 126 U/L 27(L) - 27(L)  AST 15 - 41 U/L 13(L) - 7(L)  ALT 0 - 44 U/L 19 - 13    DIAGNOSTIC IMAGING:  I have independently reviewed the scans and discussed with the patient. NM PET Image Initial (PI) Whole Body  Result Date: 03/09/2021 CLINICAL DATA:  Initial treatment strategy for monoclonal gammopathy of undetermined significance. EXAM: NUCLEAR MEDICINE PET WHOLE BODY TECHNIQUE: 11.9 mCi F-18 FDG was injected intravenously. Full-ring PET imaging was performed from the head to foot after the radiotracer. CT data was obtained and used for attenuation correction and anatomic localization. Fasting blood glucose: Not provided mg/dl COMPARISON:  Chest CT 12/18/2020 FINDINGS: Mediastinal blood pool activity: SUV max 3.7 HEAD/NECK: Hypermetabolism in the RIGHT frontal lobe consistent with remote infarction. Stable by CT report from 2013. No hypermetabolic cervical lymph nodes. Incidental CT findings: none CHEST: No hypermetabolic mediastinal or hilar nodes. No suspicious pulmonary nodules on the CT scan. Incidental CT findings: none ABDOMEN/PELVIS: No abnormal hypermetabolic activity within the liver, pancreas, adrenal glands, or spleen. No hypermetabolic lymph nodes in the abdomen or pelvis. Incidental CT findings: Benign RIGHT renal cyst. Atherosclerotic calcification of the aorta. Prostate unremarkable. SKELETON: No focal metabolic activity within the skeleton. Incidental CT findings: No  lytic lesions on CT portion of exam. Bilateral pars defects at L5 grade 1 anterolisthesis. EXTREMITIES: Physiologic activity noted within the musculature of the RIGHT lower extremity. No abnormal activity within the skeleton. Incidental CT findings: none IMPRESSION: 1. No evidence of active multiple myeloma on whole-body FDG PET scan. 2. No soft tissue plasmacytoma. 3. No suspicious lytic lesions on CT portion exam. Electronically Signed   By: Suzy Bouchard M.D.   On: 03/09/2021 10:48   VAS US DUPLEX DIALYSIS ACCESS (AVF, AVG)  Result Date: 03/19/2021 DIALYSIS ACCESS Patient Name:  ARLYNN MCDERMID Arens  Date of Exam:   03/19/2021 Medical Rec #: [id number]       Accession #:    [id number] Date of Birth: 10-30-1956       Patient Gender: M Patient Age:   65Y Exam [facility] Procedure:      VAS US DUPLEX DIALYSIS ACCESS (AVF, AVG) Referring Phys: [place] --------------------------------------------------------------------------------  Reason for Exam: Routine follow up. Access Site: Right Upper Extremity. Access Type: Radial-cephalic AVF. History: Created on 02/08/21. Performing Technologist: Ralene Cork RVT  Examination Guidelines: A complete evaluation includes B-mode imaging, spectral Doppler, color Doppler, and power Doppler as needed of all accessible portions of each vessel. Unilateral testing is considered an integral part of a complete examination. Limited examinations for reoccurring indications may be performed as noted.  Findings: +--------------------+----------+-----------------+--------+ AVF                 PSV (cm/s)Flow Vol (mL/min)Comments +--------------------+----------+-----------------+--------+ Native artery inflow   207  695                +--------------------+----------+-----------------+--------+ AVF Anastomosis        510                              +--------------------+----------+-----------------+--------+   +------------+----------+-------------+----------+--------------------------+ OUTFLOW VEINPSV (cm/s)Diameter (cm)Depth (cm)         Describe          +------------+----------+-------------+----------+--------------------------+ Prox Forearm   111        0.42        0.55         branch: 0.329        +------------+----------+-------------+----------+--------------------------+ Mid Forearm     83        0.46        0.50         Retained valve       +------------+----------+-------------+----------+--------------------------+ Dist Forearm   405        0.38        0.67   branch: 0.347 and 0.306 cm +------------+----------+-------------+----------+--------------------------+   Summary: Patent right radiocephalic AVF. Branches in the proximal and distal forearm . Retained valve with no stenosis in the mid forearm.  *See table(s) above for measurements and observations.  Diagnosing physician: Curt Jews MD Electronically signed by Curt Jews MD on 03/19/2021 at 10:45:03 AM.    --------------------------------------------------------------------------------   Final    OCT, Retina - OU - Both Eyes  Result Date: 03/07/2021 Right Eye Quality was good. Scan locations included subfoveal. Central Foveal Thickness: 240. Progression has been stable. Findings include normal foveal contour. Left Eye Quality was good. Scan locations included subfoveal. Central Foveal Thickness: 266. Progression has been stable. Notes No active maculopathy OD or OS No signs of active maculopathy or retinopathy of either eye    ASSESSMENT:  1. 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  Basis for CKD is hypertension associated arteriosclerosis with arterionephrosclerosis. - Bone marrow biopsy on 02/21/2021 with slightly hypercellular bone marrow with trilineage hematopoiesis.  Plasma cells  increased in number representing 11% of all cells with kappa light chain restriction. - Myeloma FISH panel with no evidence of T p53.  Plasma cell enrichment yielded limited cellularity, therefore only tested for T p53 probes at. - Chromosome analysis 45, X,- Y (8)/46, XY (12) - Labs on 02/01/2021-M spike 2 g, kappa light chain 794, free light chain ratio 49.97.  Beta-2 microglobulin 5.5, albumin 3.5.  LDH normal. - PET scan on 03/08/2021 with no evidence of active myeloma.  No soft tissue plasmacytoma. - 24-hour urine total protein 2.2 g on 03/13/2021.  Immunofixation positive for IgG kappa. - CyBorD started on 03/14/2021.  2. Social/family history: -He is retired Administrator. No exposure to chemicals. Smoked 2 packs/day for 32 years and quit smoking cigarettes and vaping now. -Mother had small cell lung cancer.  3. CKD: -He has stage V CKD thought to be secondary to hypertension. -Renal ultrasound on 12/19/2020 was negative for obstructive uropathy and findings were consistent with chronic medical renal disease. Bilateral kidney cysts were seen. -He has 2.7 g of proteinuria on 24-hour urine. -Kidney biopsy was done on 01/25/2021.   PLAN:  1.  Stage II, standard riskIgG kappa multiple myeloma: -He has tolerated first weekly dose of CyBorD last week reasonably well.  Did not have any GI side  effects. - We have also reviewed PET scan results which did not show any bone lesions. - Reviewed his labs today.  Creatinine improved to 4.01 from 4.50.  LFTs are grossly normal.  White count and platelet count is adequate to proceed with treatment today. - He will proceed with day 8 cycle 1 today.  We will maintain cyclophosphamide at 200 mg per metered squared during cycle 1.  I plan to see him back in 3 weeks for follow-up. - I plan to increase cyclophosphamide 300 mg per metered square during cycle 2. - We will consider introducing Revlimid in place of cyclophosphamide once renal function  stabilizes. - We will consider referral to transplant center.  2. Smoking history: -CT chest on 02/15/2021 was lung RADS 1.  3.  ID prophylaxis: -  Continue aspirin for thromboprophylaxis. - Once his renal function improves, we will start him on acyclovir.  4.  Myeloma bone disease: -Due to his poor renal function, we will likely consider denosumab or Zometa.   Orders placed this encounter:  No orders of the defined types were placed in this encounter.    Derek Jack, MD [place] [phone]   I, Thana Ates, am acting as a scribe for Dr. Derek Jack.  I, Derek Jack MD, have reviewed the above documentation for accuracy and completeness, and I agree with the above.

## 2021-03-21 ENCOUNTER — Inpatient Hospital Stay (HOSPITAL_COMMUNITY): Payer: Medicare HMO | Admitting: Hematology

## 2021-03-21 ENCOUNTER — Inpatient Hospital Stay (HOSPITAL_COMMUNITY): Payer: Medicare HMO

## 2021-03-21 ENCOUNTER — Other Ambulatory Visit: Payer: Self-pay

## 2021-03-21 VITALS — BP 111/70 | HR 89 | Temp 97.5°F | Resp 20 | Wt 241.0 lb

## 2021-03-21 VITALS — BP 110/74 | HR 91 | Temp 97.3°F | Resp 18

## 2021-03-21 DIAGNOSIS — Z5111 Encounter for antineoplastic chemotherapy: Secondary | ICD-10-CM | POA: Diagnosis not present

## 2021-03-21 DIAGNOSIS — D472 Monoclonal gammopathy: Secondary | ICD-10-CM | POA: Diagnosis not present

## 2021-03-21 DIAGNOSIS — C9000 Multiple myeloma not having achieved remission: Secondary | ICD-10-CM

## 2021-03-21 DIAGNOSIS — Z5112 Encounter for antineoplastic immunotherapy: Secondary | ICD-10-CM | POA: Diagnosis not present

## 2021-03-21 LAB — CBC WITH DIFFERENTIAL/PLATELET
Abs Immature Granulocytes: 0.09 10*3/uL — ABNORMAL HIGH (ref 0.00–0.07)
Basophils Absolute: 0.1 10*3/uL (ref 0.0–0.1)
Basophils Relative: 1 %
Eosinophils Absolute: 0.3 10*3/uL (ref 0.0–0.5)
Eosinophils Relative: 3 %
HCT: 31.9 % — ABNORMAL LOW (ref 39.0–52.0)
Hemoglobin: 10.5 g/dL — ABNORMAL LOW (ref 13.0–17.0)
Immature Granulocytes: 1 %
Lymphocytes Relative: 43 %
Lymphs Abs: 4.9 10*3/uL — ABNORMAL HIGH (ref 0.7–4.0)
MCH: 31.3 pg (ref 26.0–34.0)
MCHC: 32.9 g/dL (ref 30.0–36.0)
MCV: 95.2 fL (ref 80.0–100.0)
Monocytes Absolute: 1 10*3/uL (ref 0.1–1.0)
Monocytes Relative: 9 %
Neutro Abs: 4.8 10*3/uL (ref 1.7–7.7)
Neutrophils Relative %: 43 %
Platelets: 186 10*3/uL (ref 150–400)
RBC: 3.35 MIL/uL — ABNORMAL LOW (ref 4.22–5.81)
RDW: 14.9 % (ref 11.5–15.5)
WBC: 11.1 10*3/uL — ABNORMAL HIGH (ref 4.0–10.5)
nRBC: 0.3 % — ABNORMAL HIGH (ref 0.0–0.2)

## 2021-03-21 LAB — COMPREHENSIVE METABOLIC PANEL
ALT: 16 U/L (ref 0–44)
AST: 10 U/L — ABNORMAL LOW (ref 15–41)
Albumin: 3.2 g/dL — ABNORMAL LOW (ref 3.5–5.0)
Alkaline Phosphatase: 27 U/L — ABNORMAL LOW (ref 38–126)
Anion gap: 7 (ref 5–15)
BUN: 36 mg/dL — ABNORMAL HIGH (ref 8–23)
CO2: 20 mmol/L — ABNORMAL LOW (ref 22–32)
Calcium: 8.5 mg/dL — ABNORMAL LOW (ref 8.9–10.3)
Chloride: 108 mmol/L (ref 98–111)
Creatinine, Ser: 4.01 mg/dL — ABNORMAL HIGH (ref 0.61–1.24)
GFR, Estimated: 16 mL/min — ABNORMAL LOW (ref >60)
Glucose, Bld: 114 mg/dL — ABNORMAL HIGH (ref 70–99)
Potassium: 3.5 mmol/L (ref 3.5–5.1)
Sodium: 135 mmol/L (ref 135–145)
Total Bilirubin: 0.6 mg/dL (ref 0.3–1.2)
Total Protein: 7.9 g/dL (ref 6.5–8.1)

## 2021-03-21 MED ORDER — SODIUM CHLORIDE 0.9 % IV SOLN: Freq: Once | INTRAVENOUS | Status: AC

## 2021-03-21 MED ORDER — SODIUM CHLORIDE 0.9 % IV SOLN
200.0000 mg/m2 | Freq: Once | INTRAVENOUS | Status: AC
  Administered 2021-03-21: 460 mg via INTRAVENOUS
  Filled 2021-03-21: qty 23

## 2021-03-21 MED ORDER — PALONOSETRON HCL INJECTION 0.25 MG/5ML
0.2500 mg | Freq: Once | INTRAVENOUS | Status: AC
  Administered 2021-03-21: 0.25 mg via INTRAVENOUS
  Filled 2021-03-21: qty 5

## 2021-03-21 MED ORDER — BORTEZOMIB CHEMO SQ INJECTION 3.5 MG (2.5MG/ML)
1.5000 mg/m2 | Freq: Once | INTRAMUSCULAR | Status: AC
  Administered 2021-03-21: 3.5 mg via SUBCUTANEOUS
  Filled 2021-03-21: qty 1.4

## 2021-03-21 MED ORDER — SODIUM CHLORIDE 0.9 % IV SOLN
40.0000 mg | Freq: Once | INTRAVENOUS | Status: AC
Start: 2021-03-21 — End: 2021-03-21
  Administered 2021-03-21: 40 mg via INTRAVENOUS
  Filled 2021-03-21: qty 4

## 2021-03-21 NOTE — Progress Notes (Signed)
OK to treat with today's labs per RN.   Maintain cytoxan at 200 mg/m2 for first cycle per Dr Delton Coombes.  T.O. Dr Rhys Martini, PharmD

## 2021-03-21 NOTE — Patient Instructions (Signed)
[facility] at [facility] Discharge Instructions  You were seen today by Dr. Katragadda. He went over your recent results. Dr. Katragadda will see you back in 3 weeks for labs and follow up.   Thank you for choosing [facility] at [facility] to provide your oncology and hematology care.  To afford each patient quality time with our provider, please arrive at least 15 minutes before your scheduled appointment time.   If you have a lab appointment with the Cancer Center please come in thru the Main Entrance and check in at the main information desk  You need to re-schedule your appointment should you arrive 10 or more minutes late.  We strive to give you quality time with our providers, and arriving late affects you and other patients whose appointments are after yours.  Also, if you no show three or more times for appointments you may be dismissed from the clinic at the providers discretion.     Again, thank you for choosing [facility].  Our hope is that these requests will decrease the amount of time that you wait before being seen by our physicians.       _____________________________________________________________  Should you have questions after your visit to [facility], please contact our office at [phone] between the hours of 8:00 a.m. and 4:30 p.m.  Voicemails left after 4:00 p.m. will not be returned until the following business day.  For prescription refill requests, have your pharmacy contact our office and allow 72 hours.    Cancer Center Support Programs:   > Cancer Support Group  2nd Tuesday of the month 1pm-2pm, Journey Room    

## 2021-03-21 NOTE — Patient Instructions (Signed)
[facility]  Discharge Instructions: Thank you for choosing [facility] to provide your oncology and hematology care.  If you have a lab appointment with the [facility], please come in thru the Main Entrance and check in at the main information desk.  Wear comfortable clothing and clothing appropriate for easy access to any Portacath or PICC line.   We strive to give you quality time with your provider. You may need to reschedule your appointment if you arrive late (15 or more minutes).  Arriving late affects you and other patients whose appointments are after yours.  Also, if you miss three or more appointments without notifying the office, you may be dismissed from the clinic at the provider's discretion.      For prescription refill requests, have your pharmacy contact our office and allow 72 hours for refills to be completed.    Today you received the following chemotherapy and/or immunotherapy agents: Velcade & Cytoxan     To help prevent nausea and vomiting after your treatment, we encourage you to take your nausea medication as directed.  BELOW ARE SYMPTOMS THAT SHOULD BE REPORTED IMMEDIATELY: . *FEVER GREATER THAN 100.4 F (38 C) OR HIGHER . *CHILLS OR SWEATING . *NAUSEA AND VOMITING THAT IS NOT CONTROLLED WITH YOUR NAUSEA MEDICATION . *UNUSUAL SHORTNESS OF BREATH . *UNUSUAL BRUISING OR BLEEDING . *URINARY PROBLEMS (pain or burning when urinating, or frequent urination) . *BOWEL PROBLEMS (unusual diarrhea, constipation, pain near the anus) . TENDERNESS IN MOUTH AND THROAT WITH OR WITHOUT PRESENCE OF ULCERS (sore throat, sores in mouth, or a toothache) . UNUSUAL RASH, SWELLING OR PAIN  . UNUSUAL VAGINAL DISCHARGE OR ITCHING   Items with * indicate a potential emergency and should be followed up as soon as possible or go to the Emergency Department if any problems should occur.  Please show the CHEMOTHERAPY ALERT CARD or IMMUNOTHERAPY ALERT CARD at  check-in to the Emergency Department and triage nurse.  Should you have questions after your visit or need to cancel or reschedule your appointment, please contact [facility] [phone]  and follow the prompts.  Office hours are 8:00 a.m. to 4:30 p.m. Monday - Friday. Please note that voicemails left after 4:00 p.m. may not be returned until the following business day.  We are closed weekends and major holidays. You have access to a nurse at all times for urgent questions. Please call the main number to the clinic [phone] and follow the prompts.  For any non-urgent questions, you may also contact your provider using MyChart. We now offer e-Visits for anyone 28 and older to request care online for non-urgent symptoms. For details visit [email or web address].   Also download the MyChart app! Go to the app store, search "MyChart", open the app, select [facility], and log in with your MyChart username and password.  Due to Covid, a mask is required upon entering the hospital/clinic. If you do not have a mask, one will be given to you upon arrival. For doctor visits, patients may have 1 support person aged 31 or older with them. For treatment visits, patients cannot have anyone with them due to current Covid guidelines and our immunocompromised population.

## 2021-03-21 NOTE — Progress Notes (Signed)
Okay to treat with today's labs per Dr. Delton Coombes.  Patient tolerated Velcade injection with no complaints voiced. Lab work reviewed. See MAR for details. Injection site clean and dry with no bruising or swelling noted. Patient stable during and after injection. Band aid applied. Patient tolerated Cytoxan with no complaints voiced. Side effects with management reviewed understanding verbalized. IV site clean and dry with no bruising or swelling noted at site. Good blood return noted before and after administration of chemotherapy. Band aid applied. Patient left in satisfactory condition with VSS and no s/s of distress noted.

## 2021-03-28 ENCOUNTER — Other Ambulatory Visit: Payer: Self-pay

## 2021-03-28 ENCOUNTER — Inpatient Hospital Stay (HOSPITAL_COMMUNITY): Payer: Medicare HMO

## 2021-03-28 VITALS — BP 117/70 | HR 79 | Temp 96.9°F | Resp 18 | Wt 239.4 lb

## 2021-03-28 DIAGNOSIS — C9000 Multiple myeloma not having achieved remission: Secondary | ICD-10-CM

## 2021-03-28 DIAGNOSIS — Z5112 Encounter for antineoplastic immunotherapy: Secondary | ICD-10-CM | POA: Diagnosis not present

## 2021-03-28 DIAGNOSIS — Z5111 Encounter for antineoplastic chemotherapy: Secondary | ICD-10-CM | POA: Diagnosis not present

## 2021-03-28 LAB — CBC WITH DIFFERENTIAL/PLATELET
Abs Immature Granulocytes: 0.05 10*3/uL (ref 0.00–0.07)
Basophils Absolute: 0.1 10*3/uL (ref 0.0–0.1)
Basophils Relative: 1 %
Eosinophils Absolute: 0.2 10*3/uL (ref 0.0–0.5)
Eosinophils Relative: 2 %
HCT: 35.4 % — ABNORMAL LOW (ref 39.0–52.0)
Hemoglobin: 11.4 g/dL — ABNORMAL LOW (ref 13.0–17.0)
Immature Granulocytes: 1 %
Lymphocytes Relative: 44 %
Lymphs Abs: 5 10*3/uL — ABNORMAL HIGH (ref 0.7–4.0)
MCH: 31.8 pg (ref 26.0–34.0)
MCHC: 32.2 g/dL (ref 30.0–36.0)
MCV: 98.6 fL (ref 80.0–100.0)
Monocytes Absolute: 1 10*3/uL (ref 0.1–1.0)
Monocytes Relative: 9 %
Neutro Abs: 4.8 10*3/uL (ref 1.7–7.7)
Neutrophils Relative %: 43 %
Platelets: 168 10*3/uL (ref 150–400)
RBC: 3.59 MIL/uL — ABNORMAL LOW (ref 4.22–5.81)
RDW: 15.5 % (ref 11.5–15.5)
WBC: 11 10*3/uL — ABNORMAL HIGH (ref 4.0–10.5)
nRBC: 0.4 % — ABNORMAL HIGH (ref 0.0–0.2)

## 2021-03-28 LAB — COMPREHENSIVE METABOLIC PANEL
ALT: 18 U/L (ref 0–44)
AST: 12 U/L — ABNORMAL LOW (ref 15–41)
Albumin: 3.5 g/dL (ref 3.5–5.0)
Alkaline Phosphatase: 30 U/L — ABNORMAL LOW (ref 38–126)
Anion gap: 7 (ref 5–15)
BUN: 30 mg/dL — ABNORMAL HIGH (ref 8–23)
CO2: 22 mmol/L (ref 22–32)
Calcium: 9.1 mg/dL (ref 8.9–10.3)
Chloride: 108 mmol/L (ref 98–111)
Creatinine, Ser: 3.58 mg/dL — ABNORMAL HIGH (ref 0.61–1.24)
GFR, Estimated: 18 mL/min — ABNORMAL LOW (ref >60)
Glucose, Bld: 86 mg/dL (ref 70–99)
Potassium: 4.2 mmol/L (ref 3.5–5.1)
Sodium: 137 mmol/L (ref 135–145)
Total Bilirubin: 0.5 mg/dL (ref 0.3–1.2)
Total Protein: 8.3 g/dL — ABNORMAL HIGH (ref 6.5–8.1)

## 2021-03-28 MED ORDER — SODIUM CHLORIDE 0.9 % IV SOLN
40.0000 mg | Freq: Once | INTRAVENOUS | Status: AC
  Administered 2021-03-28: 40 mg via INTRAVENOUS
  Filled 2021-03-28: qty 4

## 2021-03-28 MED ORDER — PALONOSETRON HCL INJECTION 0.25 MG/5ML
0.2500 mg | Freq: Once | INTRAVENOUS | Status: AC
  Administered 2021-03-28: 0.25 mg via INTRAVENOUS
  Filled 2021-03-28: qty 5

## 2021-03-28 MED ORDER — SODIUM CHLORIDE 0.9 % IV SOLN
200.0000 mg/m2 | Freq: Once | INTRAVENOUS | Status: AC
  Administered 2021-03-28: 460 mg via INTRAVENOUS
  Filled 2021-03-28: qty 23

## 2021-03-28 MED ORDER — BORTEZOMIB CHEMO SQ INJECTION 3.5 MG (2.5MG/ML)
1.5000 mg/m2 | Freq: Once | INTRAMUSCULAR | Status: AC
  Administered 2021-03-28: 3.5 mg via SUBCUTANEOUS
  Filled 2021-03-28: qty 1.4

## 2021-03-28 MED ORDER — SODIUM CHLORIDE 0.9 % IV SOLN: Freq: Once | INTRAVENOUS | Status: AC

## 2021-03-28 NOTE — Patient Instructions (Signed)
[facility]  Discharge Instructions: Thank you for choosing [facility] to provide your oncology and hematology care.  If you have a lab appointment with the Cancer Center, please come in thru the Main Entrance and check in at the main information desk.  Wear comfortable clothing and clothing appropriate for easy access to any Portacath or PICC line.   We strive to give you quality time with your provider. You may need to reschedule your appointment if you arrive late (15 or more minutes).  Arriving late affects you and other patients whose appointments are after yours.  Also, if you miss three or more appointments without notifying the office, you may be dismissed from the clinic at the provider's discretion.      For prescription refill requests, have your pharmacy contact our office and allow 72 hours for refills to be completed.    Today you received the following chemotherapy and/or immunotherapy agents Cytoxan and Velcade.   To help prevent nausea and vomiting after your treatment, we encourage you to take your nausea medication as directed.  BELOW ARE SYMPTOMS THAT SHOULD BE REPORTED IMMEDIATELY: *FEVER GREATER THAN 100.4 F (38 C) OR HIGHER *CHILLS OR SWEATING *NAUSEA AND VOMITING THAT IS NOT CONTROLLED WITH YOUR NAUSEA MEDICATION *UNUSUAL SHORTNESS OF BREATH *UNUSUAL BRUISING OR BLEEDING *URINARY PROBLEMS (pain or burning when urinating, or frequent urination) *BOWEL PROBLEMS (unusual diarrhea, constipation, pain near the anus) TENDERNESS IN MOUTH AND THROAT WITH OR WITHOUT PRESENCE OF ULCERS (sore throat, sores in mouth, or a toothache) UNUSUAL RASH, SWELLING OR PAIN  UNUSUAL VAGINAL DISCHARGE OR ITCHING   Items with * indicate a potential emergency and should be followed up as soon as possible or go to the Emergency Department if any problems should occur.  Please show the CHEMOTHERAPY ALERT CARD or IMMUNOTHERAPY ALERT CARD at check-in to the  Emergency Department and triage nurse.  Should you have questions after your visit or need to cancel or reschedule your appointment, please contact [facility] [phone]  and follow the prompts.  Office hours are 8:00 a.m. to 4:30 p.m. Monday - Friday. Please note that voicemails left after 4:00 p.m. may not be returned until the following business day.  We are closed weekends and major holidays. You have access to a nurse at all times for urgent questions. Please call the main number to the clinic [phone] and follow the prompts.  For any non-urgent questions, you may also contact your provider using MyChart. We now offer e-Visits for anyone 18 and older to request care online for non-urgent symptoms. For details visit [email or web address].   Also download the MyChart app! Go to the app store, search "MyChart", open the app, select [facility], and log in with your MyChart username and password.  Due to Covid, a mask is required upon entering the hospital/clinic. If you do not have a mask, one will be given to you upon arrival. For doctor visits, patients may have 1 support person aged 18 or older with them. For treatment visits, patients cannot have anyone with them due to current Covid guidelines and our immunocompromised population.  

## 2021-03-28 NOTE — Progress Notes (Signed)
Patient presents today for Cytoxan infusion and Velcade injection per MD order.  Vital signs within parameters for treatment.  Labs pending.  Patient has no new complaints since last visit.  Peripheral IV started and blood return noted pre and post infusion.  Cytoxan infusion and Velcade injection given today per MD orders.  Tolerated infusion and injection without adverse affects.  Injection site WNL.  Vital signs stable.  No complaints at this time.  Discharge from clinic ambulatory in stable condition.  Alert and oriented X 3.  Follow up with [facility] as scheduled.

## 2021-03-28 NOTE — Progress Notes (Signed)
Patient with CKD, Dr Delton Coombes is aware of elevated creatinine levels and ok to treat.  Keeping Cyclophosphamide at 200 mg/m2 for first cycle per Dr Delton Coombes.  Henreitta Leber, PharmD

## 2021-04-04 ENCOUNTER — Inpatient Hospital Stay (HOSPITAL_COMMUNITY): Payer: Medicare HMO

## 2021-04-04 ENCOUNTER — Encounter (HOSPITAL_COMMUNITY): Payer: Self-pay

## 2021-04-04 ENCOUNTER — Inpatient Hospital Stay (HOSPITAL_COMMUNITY): Payer: Medicare HMO | Attending: Hematology

## 2021-04-04 ENCOUNTER — Ambulatory Visit (HOSPITAL_COMMUNITY): Payer: Medicare HMO

## 2021-04-04 ENCOUNTER — Other Ambulatory Visit: Payer: Self-pay

## 2021-04-04 VITALS — BP 112/77 | HR 84 | Temp 96.9°F | Resp 19 | Wt 242.0 lb

## 2021-04-04 DIAGNOSIS — C9000 Multiple myeloma not having achieved remission: Secondary | ICD-10-CM | POA: Insufficient documentation

## 2021-04-04 DIAGNOSIS — Z5111 Encounter for antineoplastic chemotherapy: Secondary | ICD-10-CM | POA: Insufficient documentation

## 2021-04-04 DIAGNOSIS — M2550 Pain in unspecified joint: Secondary | ICD-10-CM | POA: Diagnosis not present

## 2021-04-04 DIAGNOSIS — Z7952 Long term (current) use of systemic steroids: Secondary | ICD-10-CM | POA: Insufficient documentation

## 2021-04-04 DIAGNOSIS — T451X5A Adverse effect of antineoplastic and immunosuppressive drugs, initial encounter: Secondary | ICD-10-CM | POA: Insufficient documentation

## 2021-04-04 DIAGNOSIS — Z79899 Other long term (current) drug therapy: Secondary | ICD-10-CM | POA: Diagnosis not present

## 2021-04-04 DIAGNOSIS — Z5112 Encounter for antineoplastic immunotherapy: Secondary | ICD-10-CM | POA: Diagnosis not present

## 2021-04-04 DIAGNOSIS — M7989 Other specified soft tissue disorders: Secondary | ICD-10-CM | POA: Diagnosis not present

## 2021-04-04 DIAGNOSIS — D6481 Anemia due to antineoplastic chemotherapy: Secondary | ICD-10-CM | POA: Diagnosis not present

## 2021-04-04 DIAGNOSIS — R600 Localized edema: Secondary | ICD-10-CM | POA: Diagnosis not present

## 2021-04-04 DIAGNOSIS — N184 Chronic kidney disease, stage 4 (severe): Secondary | ICD-10-CM | POA: Diagnosis not present

## 2021-04-04 LAB — CBC WITH DIFFERENTIAL/PLATELET
Basophils Absolute: 0 10*3/uL (ref 0.0–0.1)
Basophils Relative: 0 %
Eosinophils Absolute: 0.1 10*3/uL (ref 0.0–0.5)
Eosinophils Relative: 1 %
HCT: 33.3 % — ABNORMAL LOW (ref 39.0–52.0)
Hemoglobin: 10.7 g/dL — ABNORMAL LOW (ref 13.0–17.0)
Lymphocytes Relative: 58 %
Lymphs Abs: 5.3 10*3/uL — ABNORMAL HIGH (ref 0.7–4.0)
MCH: 31.5 pg (ref 26.0–34.0)
MCHC: 32.1 g/dL (ref 30.0–36.0)
MCV: 97.9 fL (ref 80.0–100.0)
Monocytes Absolute: 0.5 10*3/uL (ref 0.1–1.0)
Monocytes Relative: 5 %
Neutro Abs: 3.3 10*3/uL (ref 1.7–7.7)
Neutrophils Relative %: 36 %
Platelets: 159 10*3/uL (ref 150–400)
RBC: 3.4 MIL/uL — ABNORMAL LOW (ref 4.22–5.81)
RDW: 15.7 % — ABNORMAL HIGH (ref 11.5–15.5)
WBC: 9.1 10*3/uL (ref 4.0–10.5)
nRBC: 1.5 % — ABNORMAL HIGH (ref 0.0–0.2)
nRBC: 5 /100 WBC — ABNORMAL HIGH

## 2021-04-04 LAB — COMPREHENSIVE METABOLIC PANEL
ALT: 22 U/L (ref 0–44)
AST: 13 U/L — ABNORMAL LOW (ref 15–41)
Albumin: 3.4 g/dL — ABNORMAL LOW (ref 3.5–5.0)
Alkaline Phosphatase: 29 U/L — ABNORMAL LOW (ref 38–126)
Anion gap: 6 (ref 5–15)
BUN: 41 mg/dL — ABNORMAL HIGH (ref 8–23)
CO2: 21 mmol/L — ABNORMAL LOW (ref 22–32)
Calcium: 8.8 mg/dL — ABNORMAL LOW (ref 8.9–10.3)
Chloride: 108 mmol/L (ref 98–111)
Creatinine, Ser: 3.42 mg/dL — ABNORMAL HIGH (ref 0.61–1.24)
GFR, Estimated: 19 mL/min — ABNORMAL LOW (ref >60)
Glucose, Bld: 96 mg/dL (ref 70–99)
Potassium: 4.1 mmol/L (ref 3.5–5.1)
Sodium: 135 mmol/L (ref 135–145)
Total Bilirubin: 0.6 mg/dL (ref 0.3–1.2)
Total Protein: 7.9 g/dL (ref 6.5–8.1)

## 2021-04-04 LAB — SURGICAL PATHOLOGY

## 2021-04-04 MED ORDER — SODIUM CHLORIDE 0.9% FLUSH: 10.0000 mL | INTRAVENOUS | Status: DC | PRN

## 2021-04-04 MED ORDER — PALONOSETRON HCL INJECTION 0.25 MG/5ML
0.2500 mg | Freq: Once | INTRAVENOUS | Status: AC
Start: 2021-04-04 — End: 2021-04-04
  Administered 2021-04-04: 0.25 mg via INTRAVENOUS
  Filled 2021-04-04: qty 5

## 2021-04-04 MED ORDER — BORTEZOMIB CHEMO SQ INJECTION 3.5 MG (2.5MG/ML)
1.5000 mg/m2 | Freq: Once | INTRAMUSCULAR | Status: AC
  Administered 2021-04-04: 3.5 mg via SUBCUTANEOUS
  Filled 2021-04-04: qty 1.4

## 2021-04-04 MED ORDER — SODIUM CHLORIDE 0.9 % IV SOLN
200.0000 mg/m2 | Freq: Once | INTRAVENOUS | Status: AC
  Administered 2021-04-04: 460 mg via INTRAVENOUS
  Filled 2021-04-04: qty 23

## 2021-04-04 MED ORDER — SODIUM CHLORIDE 0.9 % IV SOLN
40.0000 mg | Freq: Once | INTRAVENOUS | Status: AC
  Administered 2021-04-04: 40 mg via INTRAVENOUS
  Filled 2021-04-04: qty 4

## 2021-04-04 MED ORDER — SODIUM CHLORIDE 0.9 % IV SOLN
Freq: Once | INTRAVENOUS | Status: AC
Start: 2021-04-04 — End: 2021-04-04

## 2021-04-04 NOTE — Progress Notes (Signed)
Creatinine 3.42.  Okay for treatment per Dr Raliegh Ip.   Tolerated treatment well today without incidence.  Stable during and after treatment.  Vital signs stable prior to discharge.  Discharged in stable condition ambulatory. AVS reviewed.

## 2021-04-04 NOTE — Patient Instructions (Signed)
[facility]  Discharge Instructions: Thank you for choosing [facility] to provide your oncology and hematology care.  If you have a lab appointment with the [facility], please come in thru the Main Entrance and check in at the main information desk.  Wear comfortable clothing and clothing appropriate for easy access to any Portacath or PICC line.   We strive to give you quality time with your provider. You may need to reschedule your appointment if you arrive late (15 or more minutes).  Arriving late affects you and other patients whose appointments are after yours.  Also, if you miss three or more appointments without notifying the office, you may be dismissed from the clinic at the provider's discretion.      For prescription refill requests, have your pharmacy contact our office and allow 72 hours for refills to be completed.    Today you received the following chemotherapy and/or immunotherapy agents velcade/cytoxan      To help prevent nausea and vomiting after your treatment, we encourage you to take your nausea medication as directed.  BELOW ARE SYMPTOMS THAT SHOULD BE REPORTED IMMEDIATELY: . *FEVER GREATER THAN 100.4 F (38 C) OR HIGHER . *CHILLS OR SWEATING . *NAUSEA AND VOMITING THAT IS NOT CONTROLLED WITH YOUR NAUSEA MEDICATION . *UNUSUAL SHORTNESS OF BREATH . *UNUSUAL BRUISING OR BLEEDING . *URINARY PROBLEMS (pain or burning when urinating, or frequent urination) . *BOWEL PROBLEMS (unusual diarrhea, constipation, pain near the anus) . TENDERNESS IN MOUTH AND THROAT WITH OR WITHOUT PRESENCE OF ULCERS (sore throat, sores in mouth, or a toothache) . UNUSUAL RASH, SWELLING OR PAIN  . UNUSUAL VAGINAL DISCHARGE OR ITCHING   Items with * indicate a potential emergency and should be followed up as soon as possible or go to the Emergency Department if any problems should occur.  Please show the CHEMOTHERAPY ALERT CARD or IMMUNOTHERAPY ALERT CARD at  check-in to the Emergency Department and triage nurse.  Should you have questions after your visit or need to cancel or reschedule your appointment, please contact [facility] [phone]  and follow the prompts.  Office hours are 8:00 a.m. to 4:30 p.m. Monday - Friday. Please note that voicemails left after 4:00 p.m. may not be returned until the following business day.  We are closed weekends and major holidays. You have access to a nurse at all times for urgent questions. Please call the main number to the clinic [phone] and follow the prompts.  For any non-urgent questions, you may also contact your provider using MyChart. We now offer e-Visits for anyone 43 and older to request care online for non-urgent symptoms. For details visit [email or web address].   Also download the MyChart app! Go to the app store, search "MyChart", open the app, select [facility], and log in with your MyChart username and password.  Due to Covid, a mask is required upon entering the hospital/clinic. If you do not have a mask, one will be given to you upon arrival. For doctor visits, patients may have 1 support person aged 76 or older with them. For treatment visits, patients cannot have anyone with them due to current Covid guidelines and our immunocompromised population.    Bortezomib injection What is this medicine? BORTEZOMIB (bor TEZ oh mib) targets proteins in cancer cells and stops the cancer cells from growing. It treats multiple myeloma and mantle cell lymphoma. This medicine may be used for other purposes; ask your health care provider or pharmacist if  you have questions. COMMON BRAND NAME(S): Velcade What should I tell my health care provider before I take this medicine? They need to know if you have any of these conditions:  dehydration  diabetes (high blood sugar)  heart disease  liver disease  tingling of the fingers or toes or other nerve disorder  an unusual or allergic  reaction to bortezomib, mannitol, boron, other medicines, foods, dyes, or preservatives  pregnant or trying to get pregnant  breast-feeding How should I use this medicine? This medicine is injected into a vein or under the skin. It is given by a health care provider in a hospital or clinic setting. Talk to your health care provider about the use of this medicine in children. Special care may be needed. Overdosage: If you think you have taken too much of this medicine contact a poison control center or emergency room at once. NOTE: This medicine is only for you. Do not share this medicine with others. What if I miss a dose? Keep appointments for follow-up doses. It is important not to miss your dose. Call your health care provider if you are unable to keep an appointment. What may interact with this medicine? This medicine may interact with the following medications:  ketoconazole  rifampin This list may not describe all possible interactions. Give your health care provider a list of all the medicines, herbs, non-prescription drugs, or dietary supplements you use. Also tell them if you smoke, drink alcohol, or use illegal drugs. Some items may interact with your medicine. What should I watch for while using this medicine? Your condition will be monitored carefully while you are receiving this medicine. You may need blood work done while you are taking this medicine. You may get drowsy or dizzy. Do not drive, use machinery, or do anything that needs mental alertness until you know how this medicine affects you. Do not stand up or sit up quickly, especially if you are an older patient. This reduces the risk of dizzy or fainting spells This medicine may increase your risk of getting an infection. Call your health care provider for advice if you get a fever, chills, sore throat, or other symptoms of a cold or flu. Do not treat yourself. Try to avoid being around people who are sick. Check with  your health care provider if you have severe diarrhea, nausea, and vomiting, or if you sweat a lot. The loss of too much body fluid may make it dangerous for you to take this medicine. Do not become pregnant while taking this medicine or for 7 months after stopping it. Women should inform their health care provider if they wish to become pregnant or think they might be pregnant. Men should not father a child while taking this medicine and for 4 months after stopping it. There is a potential for serious harm to an unborn child. Talk to your health care provider for more information. Do not breast-feed an infant while taking this medicine or for 2 months after stopping it. This medicine may make it more difficult to get pregnant or father a child. Talk to your health care provider if you are concerned about your fertility. What side effects may I notice from receiving this medicine? Side effects that you should report to your doctor or health care professional as soon as possible:  allergic reactions (skin rash; itching or hives; swelling of the face, lips, or tongue)  bleeding (bloody or black, tarry stools; red or dark brown urine; spitting up  blood or brown material that looks like coffee grounds; red spots on the skin; unusual bruising or bleeding from the eye, gums, or nose)  blurred vision or changes in vision  confusion  constipation  headache  heart failure (trouble breathing; fast, irregular heartbeat; sudden weight gain; swelling of the ankles, feet, hands)  infection (fever, chills, cough, sore throat, pain or trouble passing urine)  lack or loss of appetite  liver injury (dark yellow or brown urine; general ill feeling or flu-like symptoms; loss of appetite, right upper belly pain; yellowing of the eyes or skin)  low blood pressure (dizziness; feeling faint or lightheaded, falls; unusually weak or tired)  muscle cramps  pain, redness, or irritation at site where  injected  pain, tingling, numbness in the hands or feet  seizures  trouble breathing  unusual bruising or bleeding Side effects that usually do not require medical attention (report to your doctor or health care professional if they continue or are bothersome):  diarrhea  nausea  stomach pain  trouble sleeping  vomiting This list may not describe all possible side effects. Call your doctor for medical advice about side effects. You may report side effects to FDA at 1-800-FDA-1088. Where should I keep my medicine? This medicine is given in a hospital or clinic. It will not be stored at home. NOTE: This sheet is a summary. It may not cover all possible information. If you have questions about this medicine, talk to your doctor, pharmacist, or health care provider.  2021 Elsevier/Gold Standard (2020-10-12 13:22:53)   Cyclophosphamide Injection What is this medicine? CYCLOPHOSPHAMIDE (sye kloe FOSS fa mide) is a chemotherapy drug. It slows the growth of cancer cells. This medicine is used to treat many types of cancer like lymphoma, myeloma, leukemia, breast cancer, and ovarian cancer, to name a few. This medicine may be used for other purposes; ask your health care provider or pharmacist if you have questions. COMMON BRAND NAME(S): Cytoxan, Neosar What should I tell my health care provider before I take this medicine? They need to know if you have any of these conditions:  heart disease  history of irregular heartbeat  infection  kidney disease  liver disease  low blood counts, like white cells, platelets, or red blood cells  on hemodialysis  recent or ongoing radiation therapy  scarring or thickening of the lungs  trouble passing urine  an unusual or allergic reaction to cyclophosphamide, other medicines, foods, dyes, or preservatives  pregnant or trying to get pregnant  breast-feeding How should I use this medicine? This drug is usually given as an  injection into a vein or muscle or by infusion into a vein. It is administered in a hospital or clinic by a specially trained health care professional. Talk to your pediatrician regarding the use of this medicine in children. Special care may be needed. Overdosage: If you think you have taken too much of this medicine contact a poison control center or emergency room at once. NOTE: This medicine is only for you. Do not share this medicine with others. What if I miss a dose? It is important not to miss your dose. Call your doctor or health care professional if you are unable to keep an appointment. What may interact with this medicine?  amphotericin B  azathioprine  certain antivirals for HIV or hepatitis  certain medicines for blood pressure, heart disease, irregular heart beat  certain medicines that treat or prevent blood clots like warfarin  certain other medicines for cancer  cyclosporine  etanercept  indomethacin  medicines that relax muscles for surgery  medicines to increase blood counts  metronidazole This list may not describe all possible interactions. Give your health care provider a list of all the medicines, herbs, non-prescription drugs, or dietary supplements you use. Also tell them if you smoke, drink alcohol, or use illegal drugs. Some items may interact with your medicine. What should I watch for while using this medicine? Your condition will be monitored carefully while you are receiving this medicine. You may need blood work done while you are taking this medicine. Drink water or other fluids as directed. Urinate often, even at night. Some products may contain alcohol. Ask your health care professional if this medicine contains alcohol. Be sure to tell all health care professionals you are taking this medicine. Certain medicines, like metronidazole and disulfiram, can cause an unpleasant reaction when taken with alcohol. The reaction includes flushing,  headache, nausea, vomiting, sweating, and increased thirst. The reaction can last from 30 minutes to several hours. Do not become pregnant while taking this medicine or for 1 year after stopping it. Women should inform their health care professional if they wish to become pregnant or think they might be pregnant. Men should not father a child while taking this medicine and for 4 months after stopping it. There is potential for serious side effects to an unborn child. Talk to your health care professional for more information. Do not breast-feed an infant while taking this medicine or for 1 week after stopping it. This medicine has caused ovarian failure in some women. This medicine may make it more difficult to get pregnant. Talk to your health care professional if you are concerned about your fertility. This medicine has caused decreased sperm counts in some men. This may make it more difficult to father a child. Talk to your health care professional if you are concerned about your fertility. Call your health care professional for advice if you get a fever, chills, or sore throat, or other symptoms of a cold or flu. Do not treat yourself. This medicine decreases your body's ability to fight infections. Try to avoid being around people who are sick. Avoid taking medicines that contain aspirin, acetaminophen, ibuprofen, naproxen, or ketoprofen unless instructed by your health care professional. These medicines may hide a fever. Talk to your health care professional about your risk of cancer. You may be more at risk for certain types of cancer if you take this medicine. If you are going to need surgery or other procedure, tell your health care professional that you are using this medicine. Be careful brushing or flossing your teeth or using a toothpick because you may get an infection or bleed more easily. If you have any dental work done, tell your dentist you are receiving this medicine. What side effects  may I notice from receiving this medicine? Side effects that you should report to your doctor or health care professional as soon as possible:  allergic reactions like skin rash, itching or hives, swelling of the face, lips, or tongue  breathing problems  nausea, vomiting  signs and symptoms of bleeding such as bloody or black, tarry stools; red or dark brown urine; spitting up blood or brown material that looks like coffee grounds; red spots on the skin; unusual bruising or bleeding from the eyes, gums, or nose  signs and symptoms of heart failure like fast, irregular heartbeat, sudden weight gain; swelling of the ankles, feet, hands  signs and symptoms  of infection like fever; chills; cough; sore throat; pain or trouble passing urine  signs and symptoms of kidney injury like trouble passing urine or change in the amount of urine  signs and symptoms of liver injury like dark yellow or brown urine; general ill feeling or flu-like symptoms; light-colored stools; loss of appetite; nausea; right upper belly pain; unusually weak or tired; yellowing of the eyes or skin Side effects that usually do not require medical attention (report to your doctor or health care professional if they continue or are bothersome):  confusion  decreased hearing  diarrhea  facial flushing  hair loss  headache  loss of appetite  missed menstrual periods  signs and symptoms of low red blood cells or anemia such as unusually weak or tired; feeling faint or lightheaded; falls  skin discoloration This list may not describe all possible side effects. Call your doctor for medical advice about side effects. You may report side effects to FDA at 1-800-FDA-1088. Where should I keep my medicine? This drug is given in a hospital or clinic and will not be stored at home. NOTE: This sheet is a summary. It may not cover all possible information. If you have questions about this medicine, talk to your doctor,  pharmacist, or health care provider.  2021 Elsevier/Gold Standard (2019-07-26 09:53:29)

## 2021-04-09 DIAGNOSIS — N529 Male erectile dysfunction, unspecified: Secondary | ICD-10-CM | POA: Diagnosis not present

## 2021-04-09 DIAGNOSIS — N400 Enlarged prostate without lower urinary tract symptoms: Secondary | ICD-10-CM | POA: Diagnosis not present

## 2021-04-09 DIAGNOSIS — C9000 Multiple myeloma not having achieved remission: Secondary | ICD-10-CM | POA: Diagnosis not present

## 2021-04-09 DIAGNOSIS — N189 Chronic kidney disease, unspecified: Secondary | ICD-10-CM | POA: Diagnosis not present

## 2021-04-09 DIAGNOSIS — R69 Illness, unspecified: Secondary | ICD-10-CM | POA: Diagnosis not present

## 2021-04-09 DIAGNOSIS — I129 Hypertensive chronic kidney disease with stage 1 through stage 4 chronic kidney disease, or unspecified chronic kidney disease: Secondary | ICD-10-CM | POA: Diagnosis not present

## 2021-04-09 DIAGNOSIS — E785 Hyperlipidemia, unspecified: Secondary | ICD-10-CM | POA: Diagnosis not present

## 2021-04-09 DIAGNOSIS — Z6837 Body mass index (BMI) 37.0-37.9, adult: Secondary | ICD-10-CM | POA: Diagnosis not present

## 2021-04-09 DIAGNOSIS — K219 Gastro-esophageal reflux disease without esophagitis: Secondary | ICD-10-CM | POA: Diagnosis not present

## 2021-04-11 ENCOUNTER — Inpatient Hospital Stay (HOSPITAL_COMMUNITY): Payer: Medicare HMO | Admitting: Hematology and Oncology

## 2021-04-11 ENCOUNTER — Encounter (HOSPITAL_COMMUNITY): Payer: Self-pay | Admitting: *Deleted

## 2021-04-11 ENCOUNTER — Other Ambulatory Visit (HOSPITAL_COMMUNITY): Payer: Medicare HMO

## 2021-04-11 ENCOUNTER — Other Ambulatory Visit: Payer: Self-pay

## 2021-04-11 ENCOUNTER — Ambulatory Visit (HOSPITAL_COMMUNITY): Payer: Medicare HMO

## 2021-04-11 ENCOUNTER — Inpatient Hospital Stay (HOSPITAL_COMMUNITY): Payer: Medicare HMO

## 2021-04-11 ENCOUNTER — Other Ambulatory Visit (HOSPITAL_COMMUNITY): Payer: Self-pay | Admitting: *Deleted

## 2021-04-11 ENCOUNTER — Encounter (HOSPITAL_COMMUNITY): Payer: Self-pay | Admitting: Hematology and Oncology

## 2021-04-11 VITALS — BP 122/66 | HR 77 | Temp 97.9°F | Resp 18

## 2021-04-11 DIAGNOSIS — T451X5A Adverse effect of antineoplastic and immunosuppressive drugs, initial encounter: Secondary | ICD-10-CM | POA: Diagnosis not present

## 2021-04-11 DIAGNOSIS — D6481 Anemia due to antineoplastic chemotherapy: Secondary | ICD-10-CM

## 2021-04-11 DIAGNOSIS — M25561 Pain in right knee: Secondary | ICD-10-CM

## 2021-04-11 DIAGNOSIS — R600 Localized edema: Secondary | ICD-10-CM | POA: Diagnosis not present

## 2021-04-11 DIAGNOSIS — Z5111 Encounter for antineoplastic chemotherapy: Secondary | ICD-10-CM | POA: Diagnosis not present

## 2021-04-11 DIAGNOSIS — M25569 Pain in unspecified knee: Secondary | ICD-10-CM | POA: Insufficient documentation

## 2021-04-11 DIAGNOSIS — R252 Cramp and spasm: Secondary | ICD-10-CM

## 2021-04-11 DIAGNOSIS — M25562 Pain in left knee: Secondary | ICD-10-CM

## 2021-04-11 DIAGNOSIS — C9000 Multiple myeloma not having achieved remission: Secondary | ICD-10-CM

## 2021-04-11 DIAGNOSIS — D472 Monoclonal gammopathy: Secondary | ICD-10-CM

## 2021-04-11 DIAGNOSIS — N184 Chronic kidney disease, stage 4 (severe): Secondary | ICD-10-CM | POA: Diagnosis not present

## 2021-04-11 DIAGNOSIS — Z7952 Long term (current) use of systemic steroids: Secondary | ICD-10-CM | POA: Diagnosis not present

## 2021-04-11 DIAGNOSIS — M7989 Other specified soft tissue disorders: Secondary | ICD-10-CM | POA: Diagnosis not present

## 2021-04-11 DIAGNOSIS — M2550 Pain in unspecified joint: Secondary | ICD-10-CM | POA: Diagnosis not present

## 2021-04-11 DIAGNOSIS — Z5112 Encounter for antineoplastic immunotherapy: Secondary | ICD-10-CM | POA: Diagnosis not present

## 2021-04-11 LAB — CBC WITH DIFFERENTIAL/PLATELET
Abs Immature Granulocytes: 0.04 10*3/uL (ref 0.00–0.07)
Basophils Absolute: 0 10*3/uL (ref 0.0–0.1)
Basophils Relative: 1 %
Eosinophils Absolute: 0.3 10*3/uL (ref 0.0–0.5)
Eosinophils Relative: 3 %
HCT: 31.9 % — ABNORMAL LOW (ref 39.0–52.0)
Hemoglobin: 10.6 g/dL — ABNORMAL LOW (ref 13.0–17.0)
Immature Granulocytes: 1 %
Lymphocytes Relative: 37 %
Lymphs Abs: 2.8 10*3/uL (ref 0.7–4.0)
MCH: 32.2 pg (ref 26.0–34.0)
MCHC: 33.2 g/dL (ref 30.0–36.0)
MCV: 97 fL (ref 80.0–100.0)
Monocytes Absolute: 0.9 10*3/uL (ref 0.1–1.0)
Monocytes Relative: 11 %
Neutro Abs: 3.6 10*3/uL (ref 1.7–7.7)
Neutrophils Relative %: 47 %
Platelets: 161 10*3/uL (ref 150–400)
RBC: 3.29 MIL/uL — ABNORMAL LOW (ref 4.22–5.81)
RDW: 15.8 % — ABNORMAL HIGH (ref 11.5–15.5)
WBC: 7.6 10*3/uL (ref 4.0–10.5)
nRBC: 1.7 % — ABNORMAL HIGH (ref 0.0–0.2)

## 2021-04-11 LAB — COMPREHENSIVE METABOLIC PANEL
ALT: 24 U/L (ref 0–44)
AST: 15 U/L (ref 15–41)
Albumin: 3.5 g/dL (ref 3.5–5.0)
Alkaline Phosphatase: 30 U/L — ABNORMAL LOW (ref 38–126)
Anion gap: 8 (ref 5–15)
BUN: 33 mg/dL — ABNORMAL HIGH (ref 8–23)
CO2: 19 mmol/L — ABNORMAL LOW (ref 22–32)
Calcium: 8.8 mg/dL — ABNORMAL LOW (ref 8.9–10.3)
Chloride: 109 mmol/L (ref 98–111)
Creatinine, Ser: 3.41 mg/dL — ABNORMAL HIGH (ref 0.61–1.24)
GFR, Estimated: 19 mL/min — ABNORMAL LOW (ref >60)
Glucose, Bld: 96 mg/dL (ref 70–99)
Potassium: 4.2 mmol/L (ref 3.5–5.1)
Sodium: 136 mmol/L (ref 135–145)
Total Bilirubin: 0.7 mg/dL (ref 0.3–1.2)
Total Protein: 7.9 g/dL (ref 6.5–8.1)

## 2021-04-11 MED ORDER — PALONOSETRON HCL INJECTION 0.25 MG/5ML
0.2500 mg | Freq: Once | INTRAVENOUS | Status: AC
  Administered 2021-04-11: 0.25 mg via INTRAVENOUS
  Filled 2021-04-11: qty 5

## 2021-04-11 MED ORDER — SODIUM CHLORIDE 0.9 % IV SOLN: Freq: Once | INTRAVENOUS | Status: AC

## 2021-04-11 MED ORDER — BORTEZOMIB CHEMO SQ INJECTION 3.5 MG (2.5MG/ML)
1.5000 mg/m2 | Freq: Once | INTRAMUSCULAR | Status: AC
  Administered 2021-04-11: 3.5 mg via SUBCUTANEOUS
  Filled 2021-04-11: qty 1.4

## 2021-04-11 MED ORDER — SODIUM CHLORIDE 0.9 % IV SOLN
300.0000 mg/m2 | Freq: Once | INTRAVENOUS | Status: AC
  Administered 2021-04-11: 680 mg via INTRAVENOUS
  Filled 2021-04-11: qty 34

## 2021-04-11 MED ORDER — CALCIUM CARBONATE ANTACID 500 MG PO CHEW
2.0000 | CHEWABLE_TABLET | Freq: Once | ORAL | Status: AC
  Administered 2021-04-11: 400 mg via ORAL
  Filled 2021-04-11: qty 2

## 2021-04-11 MED ORDER — SODIUM CHLORIDE 0.9 % IV SOLN
40.0000 mg | Freq: Once | INTRAVENOUS | Status: AC
  Administered 2021-04-11: 40 mg via INTRAVENOUS
  Filled 2021-04-11: qty 4

## 2021-04-11 MED ORDER — PREDNISONE 10 MG PO TABS: 10.0000 mg | ORAL_TABLET | Freq: Every day | ORAL | 0 refills | Status: DC

## 2021-04-11 NOTE — Assessment & Plan Note (Signed)
He has bilateral acute knee pain, worse on the left On exam, it appears that his knee is slightly swollen Could be due to gout I recommend increase fluid hydration and a short course of prednisone

## 2021-04-11 NOTE — Patient Instructions (Signed)
[facility]  Discharge Instructions: Thank you for choosing [facility] to provide your oncology and hematology care.  If you have a lab appointment with the Cancer Center, please come in thru the Main Entrance and check in at the main information desk.  Wear comfortable clothing and clothing appropriate for easy access to any Portacath or PICC line.   We strive to give you quality time with your provider. You may need to reschedule your appointment if you arrive late (15 or more minutes).  Arriving late affects you and other patients whose appointments are after yours.  Also, if you miss three or more appointments without notifying the office, you may be dismissed from the clinic at the provider's discretion.      For prescription refill requests, have your pharmacy contact our office and allow 72 hours for refills to be completed.    Today you received the following chemotherapy and/or immunotherapy agents    To help prevent nausea and vomiting after your treatment, we encourage you to take your nausea medication as directed.  BELOW ARE SYMPTOMS THAT SHOULD BE REPORTED IMMEDIATELY: . *FEVER GREATER THAN 100.4 F (38 C) OR HIGHER . *CHILLS OR SWEATING . *NAUSEA AND VOMITING THAT IS NOT CONTROLLED WITH YOUR NAUSEA MEDICATION . *UNUSUAL SHORTNESS OF BREATH . *UNUSUAL BRUISING OR BLEEDING . *URINARY PROBLEMS (pain or burning when urinating, or frequent urination) . *BOWEL PROBLEMS (unusual diarrhea, constipation, pain near the anus) . TENDERNESS IN MOUTH AND THROAT WITH OR WITHOUT PRESENCE OF ULCERS (sore throat, sores in mouth, or a toothache) . UNUSUAL RASH, SWELLING OR PAIN  . UNUSUAL VAGINAL DISCHARGE OR ITCHING   Items with * indicate a potential emergency and should be followed up as soon as possible or go to the Emergency Department if any problems should occur.  Please show the CHEMOTHERAPY ALERT CARD or IMMUNOTHERAPY ALERT CARD at check-in to the  Emergency Department and triage nurse.  Should you have questions after your visit or need to cancel or reschedule your appointment, please contact [facility] [phone]  and follow the prompts.  Office hours are 8:00 a.m. to 4:30 p.m. Monday - Friday. Please note that voicemails left after 4:00 p.m. may not be returned until the following business day.  We are closed weekends and major holidays. You have access to a nurse at all times for urgent questions. Please call the main number to the clinic [phone] and follow the prompts.  For any non-urgent questions, you may also contact your provider using MyChart. We now offer e-Visits for anyone 18 and older to request care online for non-urgent symptoms. For details visit [email or web address].   Also download the MyChart app! Go to the app store, search "MyChart", open the app, select [facility], and log in with your MyChart username and password.  Due to Covid, a mask is required upon entering the hospital/clinic. If you do not have a mask, one will be given to you upon arrival. For doctor visits, patients may have 1 support person aged 18 or older with them. For treatment visits, patients cannot have anyone with them due to current Covid guidelines and our immunocompromised population.  

## 2021-04-11 NOTE — Progress Notes (Signed)
Labs reviewed with Dr. Alvy Bimler, will proceed with treatment today. Creatinine noted.   Treatment given per orders. Patient tolerated it well without problems. Vitals stable and discharged home from clinic ambulatory. Follow up as scheduled.

## 2021-04-11 NOTE — Assessment & Plan Note (Signed)
He has slight hypocalcemia I recommend Tums daily It may help with his cramps He will continue vitamin D supplement

## 2021-04-11 NOTE — Assessment & Plan Note (Signed)
So far, he tolerated treatment very well He has mild swelling of both knees, likely exacerbated by recent treatment and possible gout We will proceed with treatment as scheduled I recommend a short course of daily prednisone after today's treatment Per Dr. Tomie China instruction, we will increase the dose of Cytoxan today The patient is informed to watch out for additional side effects from treatment

## 2021-04-11 NOTE — Assessment & Plan Note (Signed)
This is likely due to recent treatment. The patient denies recent history of bleeding such as epistaxis, hematuria or hematochezia. He is asymptomatic from the anemia. I will observe for now.  He does not require transfusion now. I will continue the chemotherapy at current dose without dosage adjustment.  If the anemia gets progressive worse in the future, I might have to delay his treatment or adjust the chemotherapy dose.  

## 2021-04-11 NOTE — Progress Notes (Signed)
[facility] FOLLOW-UP progress notes  Patient Care Team: Celene Squibb, MD as PCP - General (Internal Medicine) Brien Mates, RN as Oncology Nurse Navigator (Oncology)  CHIEF COMPLAINTS/PURPOSE OF VISIT:  Multiple myeloma, for further chemotherapy   HISTORY OF PRESENTING ILLNESS:  Ernest Barnett 65 y.o. male was seen in the absence of his primary oncologist The patient was diagnosed with multiple myeloma, has been receiving CyBorD treatment He tolerated treatment well except this past week, developed significant bilateral knee pain He used to have knee problem on the left side He had history of gout He also complains of leg cramps No nausea or changes in bowel habits Denies peripheral neuropathy  I reviewed the patient's records extensive and collaborated the history with the patient. Summary of his history is as follows: Oncology History  Multiple myeloma ([place])  03/06/2021 Initial Diagnosis   Multiple myeloma ([place])   03/06/2021 Cancer Staging   Staging form: Plasma Cell Myeloma and Plasma Cell Disorders, AJCC 8th Edition - Clinical stage from 03/06/2021: RISS Stage II (Beta-2-microglobulin (mg/L): 5.5, Albumin (g/dL): 3.5, ISS: Stage III, High-risk cytogenetics: Absent, LDH: Normal) - Signed by Derek Jack, MD on 03/06/2021 Stage prefix: Initial diagnosis Beta 2 microglobulin range (mg/L): Greater than or equal to 5.5 Albumin range (g/dL): Greater than or equal to 3.5 Cytogenetics: No abnormalities Pretreatment monoclonal protein level in serum (M spike) (g/dL): 2 Pretreatment serum free kappa light chain level (g/L): 794 Pretreatment serum free lambda light chain level (g/L): 15.9   03/14/2021 -  Chemotherapy    Patient is on Treatment Plan: MULTIPLE MYELOMA CYBORD - WEEKLY BORTEZOMIB        MEDICAL HISTORY:  Past Medical History:  Diagnosis Date  . Chronic kidney disease   . COPD (chronic obstructive pulmonary disease) ([place])   . CVA (cerebral  vascular accident) ([place]) 2013  . GERD (gastroesophageal reflux disease)   . HOH (hard of hearing)   . Hypercholesteremia   . Hypertension   . Stroke ([place]) 03/24/2012   left sided weakness  . Vitamin D deficiency     SURGICAL HISTORY: Past Surgical History:  Procedure Laterality Date  . AV FISTULA PLACEMENT Right 02/08/2021   Procedure: RIGHT ARM ARTERIOVENOUS (AV) FISTULA CREATION;  Surgeon: [name], MD;  Location: [facility];  Service: Vascular;  Laterality: Right;  . BIOPSY  11/12/2018   Procedure: BIOPSY;  Surgeon: Rogene Houston, MD;  Location: [facility];  Service: Endoscopy;;  colon  . BIOPSY  05/10/2020   Procedure: BIOPSY;  Surgeon: Rogene Houston, MD;  Location: [facility];  Service: Endoscopy;;  esophagus  . CATARACT EXTRACTION     right eye  . CATARACT EXTRACTION W/PHACO  10/08/2012   Procedure: CATARACT EXTRACTION PHACO AND INTRAOCULAR LENS PLACEMENT (IOC);  Surgeon: Tonny Branch, MD;  Location: [facility];  Service: Ophthalmology;  Laterality: Left;  CDE:6.64  . CHOLECYSTECTOMY     MMH  . COLONOSCOPY N/A 11/12/2018   Procedure: COLONOSCOPY;  Surgeon: Rogene Houston, MD;  Location: [facility];  Service: Endoscopy;  Laterality: N/A;  1030  . ELBOW FRACTURE SURGERY     left  . ESOPHAGEAL DILATION N/A 11/12/2018   Procedure: ESOPHAGEAL DILATION;  Surgeon: Rogene Houston, MD;  [facility];  Service: Endoscopy;  Laterality: N/A;  . ESOPHAGEAL DILATION N/A 05/10/2020   Procedure: ESOPHAGEAL DILATION;  Surgeon: Rogene Houston, MD;  [facility];  Service: Endoscopy;  Laterality: N/A;  .  ESOPHAGOGASTRODUODENOSCOPY N/A 11/12/2018   Procedure: ESOPHAGOGASTRODUODENOSCOPY (EGD);  Surgeon: Rogene Houston, MD;  [facility];  Service: Endoscopy;  Laterality: N/A;  . ESOPHAGOGASTRODUODENOSCOPY N/A 05/10/2020   Procedure: ESOPHAGOGASTRODUODENOSCOPY (EGD);  Surgeon: Rogene Houston, MD;  [facility];  Service: Endoscopy;  Laterality: N/A;   210  . HERNIA REPAIR     right inguinal  . HYDROCELE EXCISION / REPAIR    . POLYPECTOMY  11/12/2018   Procedure: POLYPECTOMY;  Surgeon: Rogene Houston, MD;  [facility];  Service: Endoscopy;;  colon   . RETINAL DETACHMENT SURGERY Left 2019  . SPLENECTOMY, TOTAL    . TONSILLECTOMY    . VOCAL CORD INJECTION     removal of polyp-2005    SOCIAL HISTORY: Social History   Socioeconomic History  . Marital status: Significant Other    Spouse name: Not on file  . Number of children: Not on file  . Years of education: Not on file  . Highest education level: Not on file  Occupational History  . Not on file  Tobacco Use  . Smoking status: Former Smoker    Packs/day: 2.00    Years: 40.00    Pack years: 80.00    Types: Cigarettes, E-cigarettes  . Smokeless tobacco: Current User  . Tobacco comment: Pt only vap now  Vaping Use  . Vaping Use: Every day  . Substances: Nicotine  Substance and Sexual Activity  . Alcohol use: Yes    Comment: occassional  . Drug use: No  . Sexual activity: Yes    Birth control/protection: None  Other Topics Concern  . Not on file  Social History Narrative  . Not on file   Social Determinants of Health   Financial Resource Strain: Low Risk   . Difficulty of Paying Living Expenses: Not hard at all  Food Insecurity: No Food Insecurity  . Worried About Charity fundraiser in the Last Year: Never true  . Ran Out of Food in the Last Year: Never true  Transportation Needs: No Transportation Needs  . Lack of Transportation (Medical): No  . Lack of Transportation (Non-Medical): No  Physical Activity: Insufficiently Active  . Days of Exercise per Week: 2 days  . Minutes of Exercise per Session: 20 min  Stress: No Stress Concern Present  . Feeling of Stress : Not at all  Social Connections: Moderately Isolated  . Frequency of Communication with Friends and Family: More than three times a week  . Frequency of Social Gatherings with Friends  and Family: More than three times a week  . Attends Religious Services: 1 to 4 times per year  . Active Member of Clubs or Organizations: No  . Attends Archivist Meetings: Never  . Marital Status: Never married  Intimate Partner Violence: Not At Risk  . Fear of Current or Ex-Partner: No  . Emotionally Abused: No  . Physically Abused: No  . Sexually Abused: No    FAMILY HISTORY: History reviewed. No pertinent family history.  ALLERGIES:  has No Known Allergies.  MEDICATIONS:  Current Outpatient Medications  Medication Sig Dispense Refill  . amLODipine (NORVASC) 5 MG tablet Take 5 mg by mouth daily.    [name] aspirin EC 81 MG tablet Take 1 tablet (81 mg total) by mouth daily. 30 tablet 11  . bortezomib IV (VELCADE) 3.5 MG injection Inject 1.5 mg/m2 into the vein once a week.    . calcitRIOL (ROCALTROL) 0.25 MCG capsule Take 0.25  mcg by mouth every Monday, Wednesday, and Friday.    . cholecalciferol (VITAMIN D3) 25 MCG (1000 UNIT) tablet Take 1,000 Units by mouth daily.    . CYCLOPHOSPHAMIDE IV Inject 300 mg/m2 into the vein once a week.    [name] dexamethasone 40 mg in sodium chloride 0.9 % 50 mL Inject 40 mg into the vein once a week.    . Multiple Vitamin (MULTIVITAMIN) tablet Take by mouth.    . Omega-3 Fatty Acids (FISH OIL) 1000 MG CAPS Take 1,000 mg by mouth in the morning, at noon, and at bedtime.     . pantoprazole (PROTONIX) 40 MG tablet Take 1 tablet (40 mg total) by mouth 2 (two) times daily. 180 tablet 3  . pravastatin (PRAVACHOL) 40 MG tablet Take 40 mg by mouth daily.    . predniSONE (DELTASONE) 10 MG tablet Take 1 tablet (10 mg total) by mouth daily with breakfast. 7 tablet 0  . prochlorperazine (COMPAZINE) 10 MG tablet Take 1 tablet (10 mg total) by mouth every 6 (six) hours as needed for nausea or vomiting. 30 tablet 3  . sodium bicarbonate 650 MG tablet Take 1,300 mg by mouth 3 (three) times daily.    . tamsulosin (FLOMAX) 0.4 MG CAPS capsule Take 0.4 mg by mouth  daily.     . polyethylene glycol (MIRALAX / GLYCOLAX) packet Take 17 g by mouth daily. Patient states that he takes as needed. (Patient not taking: Reported on 04/11/2021) 30 each 5   No current facility-administered medications for this visit.    REVIEW OF SYSTEMS:   Constitutional: Denies fevers, chills or abnormal night sweats Eyes: Denies blurriness of vision, double vision or watery eyes Ears, nose, mouth, throat, and face: Denies mucositis or sore throat Respiratory: Denies cough, dyspnea or wheezes Cardiovascular: Denies palpitation, chest discomfort or lower extremity swelling Gastrointestinal:  Denies nausea, heartburn or change in bowel habits Skin: Denies abnormal skin rashes Lymphatics: Denies new lymphadenopathy or easy bruising Neurological:Denies numbness, tingling or new weaknesses Behavioral/Psych: Mood is stable, no new changes  All other systems were reviewed with the patient and are negative.  PHYSICAL EXAMINATION: ECOG PERFORMANCE STATUS: 1 - Symptomatic but completely ambulatory  Vitals:   04/11/21 1002  BP: 108/67  Pulse: 86  Resp: 18  Temp: 97.8 F (36.6 C)  SpO2: 98%   Filed Weights   04/11/21 1002  Weight: 240 lb (108.9 kg)    GENERAL:alert, no distress and comfortable SKIN: skin color, texture, turgor are normal, no rashes or significant lesions.  Noted skin bruises EYES: normal, conjunctiva are pink and non-injected, sclera clear OROPHARYNX:no exudate, normal lips, buccal mucosa, and tongue  NECK: supple, thyroid normal size, non-tender, without nodularity LYMPH:  no palpable lymphadenopathy in the cervical, axillary or inguinal LUNGS: clear to auscultation and percussion with normal breathing effort HEART: regular rate & rhythm and no murmurs without lower extremity edema ABDOMEN:abdomen soft, non-tender and normal bowel sounds.   Musculoskeletal:no cyanosis of digits and no clubbing Noted left knee joint swelling PSYCH: alert & oriented x 3  with fluent speech NEURO: no focal motor/sensory deficits  LABORATORY DATA:  I have reviewed the data as listed Lab Results  Component Value Date   WBC 7.6 04/11/2021   HGB 10.6 (L) 04/11/2021   HCT 31.9 (L) 04/11/2021   MCV 97.0 04/11/2021   PLT 161 04/11/2021   Recent Labs    03/21/21 1228 03/28/21 1222 04/04/21 0955  NA 135 137 135  K 3.5 4.2 4.1  CL 108 108 108  CO2 20* 22 21*  GLUCOSE 114* 86 96  BUN 36* 30* 41*  CREATININE 4.01* 3.58* 3.42*  CALCIUM 8.5* 9.1 8.8*  GFRNONAA 16* 18* 19*  PROT 7.9 8.3* 7.9  ALBUMIN 3.2* 3.5 3.4*  AST 10* 12* 13*  ALT _0 ALKPHOS 27* 30* 29*  BILITOT 0.6 0.5 0.6    RADIOGRAPHIC STUDIES: I have personally reviewed the radiological images as listed and agreed with the findings in the report. VAS US DUPLEX DIALYSIS ACCESS (AVF, AVG)  Result Date: 03/19/2021 DIALYSIS ACCESS Patient Name:  Ernest Barnett  Date of Exam:   03/19/2021 Medical Rec #: [id number]       Accession #:    [id number] Date of Birth: April 12, 1956       Patient Gender: M Patient Age:   65Y Exam Location:  [facility] Procedure:      VAS US DUPLEX DIALYSIS ACCESS (AVF, AVG) Referring Phys: [place] --------------------------------------------------------------------------------  Reason for Exam: Routine follow up. Access Site: Right Upper Extremity. Access Type: Radial-cephalic AVF. History: Created on 02/08/21. Performing Technologist: Ralene Cork RVT  Examination Guidelines: A complete evaluation includes B-mode imaging, spectral Doppler, color Doppler, and power Doppler as needed of all accessible portions of each vessel. Unilateral testing is considered an integral part of a complete examination. Limited examinations for reoccurring indications may be performed as noted.  Findings: +--------------------+----------+-----------------+--------+ AVF                 PSV (cm/s)Flow Vol (mL/min)Comments  +--------------------+----------+-----------------+--------+ Native artery inflow   207           695                +--------------------+----------+-----------------+--------+ AVF Anastomosis        510                              +--------------------+----------+-----------------+--------+  +------------+----------+-------------+----------+--------------------------+ OUTFLOW VEINPSV (cm/s)Diameter (cm)Depth (cm)         Describe          +------------+----------+-------------+----------+--------------------------+ Prox Forearm   111        0.42        0.55         branch: 0.329        +------------+----------+-------------+----------+--------------------------+ Mid Forearm     83        0.46        0.50         Retained valve       +------------+----------+-------------+----------+--------------------------+ Dist Forearm   405        0.38        0.67   branch: 0.347 and 0.306 cm +------------+----------+-------------+----------+--------------------------+   Summary: Patent right radiocephalic AVF. Branches in the proximal and distal forearm . Retained valve with no stenosis in the mid forearm.  *See table(s) above for measurements and observations.  Diagnosing physician: Curt Jews MD Electronically signed by Curt Jews MD on 03/19/2021 at 10:45:03 AM.    --------------------------------------------------------------------------------   Final     ASSESSMENT & PLAN:  Multiple myeloma ([place]) So far, he tolerated treatment very well He has mild swelling of both knees, likely exacerbated by recent treatment and possible gout We will proceed with treatment as scheduled I recommend a short course of daily prednisone after today's treatment Per Dr. Tomie China instruction, we will increase the dose of Cytoxan today The patient is informed  to watch out for additional side effects from treatment  Anemia due to antineoplastic chemotherapy This is likely due to recent  treatment. The patient denies recent history of bleeding such as epistaxis, hematuria or hematochezia. He is asymptomatic from the anemia. I will observe for now.  He does not require transfusion now. I will continue the chemotherapy at current dose without dosage adjustment.  If the anemia gets progressive worse in the future, I might have to delay his treatment or adjust the chemotherapy dose.   Knee pain, acute He has bilateral acute knee pain, worse on the left On exam, it appears that his knee is slightly swollen Could be due to gout I recommend increase fluid hydration and a short course of prednisone  Hypocalcemia He has slight hypocalcemia I recommend Tums daily It may help with his cramps He will continue vitamin D supplement   No orders of the defined types were placed in this encounter.   All questions were answered. The patient knows to call the clinic with any problems, questions or concerns. The total time spent in the appointment was 25 minutes encounter with patients including review of chart and various tests results, discussions about plan of care and coordination of care plan   Heath Lark, MD 04/11/2021 10:44 AM

## 2021-04-12 LAB — PROTEIN ELECTROPHORESIS, SERUM
A/G Ratio: 1.2 (ref 0.7–1.7)
Albumin ELP: 3.9 g/dL (ref 2.9–4.4)
Alpha-1-Globulin: 0.2 g/dL (ref 0.0–0.4)
Alpha-2-Globulin: 0.6 g/dL (ref 0.4–1.0)
Beta Globulin: 0.8 g/dL (ref 0.7–1.3)
Gamma Globulin: 1.7 g/dL (ref 0.4–1.8)
Globulin, Total: 3.3 g/dL (ref 2.2–3.9)
M-Spike, %: 1.5 g/dL — ABNORMAL HIGH
Total Protein ELP: 7.2 g/dL (ref 6.0–8.5)

## 2021-04-12 LAB — KAPPA/LAMBDA LIGHT CHAINS
Kappa free light chain: 382 mg/L — ABNORMAL HIGH (ref 3.3–19.4)
Kappa, lambda light chain ratio: 35.7 — ABNORMAL HIGH (ref 0.26–1.65)
Lambda free light chains: 10.7 mg/L (ref 5.7–26.3)

## 2021-04-18 ENCOUNTER — Encounter (INDEPENDENT_AMBULATORY_CARE_PROVIDER_SITE_OTHER): Payer: Medicare HMO | Admitting: Ophthalmology

## 2021-04-18 ENCOUNTER — Inpatient Hospital Stay (HOSPITAL_COMMUNITY): Payer: Medicare HMO

## 2021-04-18 ENCOUNTER — Encounter (INDEPENDENT_AMBULATORY_CARE_PROVIDER_SITE_OTHER): Payer: Self-pay

## 2021-04-18 ENCOUNTER — Other Ambulatory Visit: Payer: Self-pay

## 2021-04-18 VITALS — BP 119/80 | HR 91 | Temp 97.2°F | Resp 20 | Wt 243.2 lb

## 2021-04-18 DIAGNOSIS — N184 Chronic kidney disease, stage 4 (severe): Secondary | ICD-10-CM | POA: Diagnosis not present

## 2021-04-18 DIAGNOSIS — M7989 Other specified soft tissue disorders: Secondary | ICD-10-CM | POA: Diagnosis not present

## 2021-04-18 DIAGNOSIS — R600 Localized edema: Secondary | ICD-10-CM | POA: Diagnosis not present

## 2021-04-18 DIAGNOSIS — C9000 Multiple myeloma not having achieved remission: Secondary | ICD-10-CM

## 2021-04-18 DIAGNOSIS — Z5111 Encounter for antineoplastic chemotherapy: Secondary | ICD-10-CM | POA: Diagnosis not present

## 2021-04-18 DIAGNOSIS — Z5112 Encounter for antineoplastic immunotherapy: Secondary | ICD-10-CM | POA: Diagnosis not present

## 2021-04-18 DIAGNOSIS — D6481 Anemia due to antineoplastic chemotherapy: Secondary | ICD-10-CM | POA: Diagnosis not present

## 2021-04-18 DIAGNOSIS — M2550 Pain in unspecified joint: Secondary | ICD-10-CM | POA: Diagnosis not present

## 2021-04-18 DIAGNOSIS — T451X5A Adverse effect of antineoplastic and immunosuppressive drugs, initial encounter: Secondary | ICD-10-CM | POA: Diagnosis not present

## 2021-04-18 DIAGNOSIS — Z7952 Long term (current) use of systemic steroids: Secondary | ICD-10-CM | POA: Diagnosis not present

## 2021-04-18 LAB — COMPREHENSIVE METABOLIC PANEL
ALT: 20 U/L (ref 0–44)
AST: 14 U/L — ABNORMAL LOW (ref 15–41)
Albumin: 3.4 g/dL — ABNORMAL LOW (ref 3.5–5.0)
Alkaline Phosphatase: 33 U/L — ABNORMAL LOW (ref 38–126)
Anion gap: 7 (ref 5–15)
BUN: 38 mg/dL — ABNORMAL HIGH (ref 8–23)
CO2: 21 mmol/L — ABNORMAL LOW (ref 22–32)
Calcium: 9 mg/dL (ref 8.9–10.3)
Chloride: 109 mmol/L (ref 98–111)
Creatinine, Ser: 3.28 mg/dL — ABNORMAL HIGH (ref 0.61–1.24)
GFR, Estimated: 20 mL/min — ABNORMAL LOW (ref >60)
Glucose, Bld: 124 mg/dL — ABNORMAL HIGH (ref 70–99)
Potassium: 4 mmol/L (ref 3.5–5.1)
Sodium: 137 mmol/L (ref 135–145)
Total Bilirubin: 0.2 mg/dL — ABNORMAL LOW (ref 0.3–1.2)
Total Protein: 7.5 g/dL (ref 6.5–8.1)

## 2021-04-18 LAB — CBC WITH DIFFERENTIAL/PLATELET
Abs Immature Granulocytes: 0.05 10*3/uL (ref 0.00–0.07)
Basophils Absolute: 0.1 10*3/uL (ref 0.0–0.1)
Basophils Relative: 1 %
Eosinophils Absolute: 0.2 10*3/uL (ref 0.0–0.5)
Eosinophils Relative: 3 %
HCT: 31.3 % — ABNORMAL LOW (ref 39.0–52.0)
Hemoglobin: 10.3 g/dL — ABNORMAL LOW (ref 13.0–17.0)
Immature Granulocytes: 1 %
Lymphocytes Relative: 42 %
Lymphs Abs: 3.3 10*3/uL (ref 0.7–4.0)
MCH: 32.2 pg (ref 26.0–34.0)
MCHC: 32.9 g/dL (ref 30.0–36.0)
MCV: 97.8 fL (ref 80.0–100.0)
Monocytes Absolute: 0.8 10*3/uL (ref 0.1–1.0)
Monocytes Relative: 10 %
Neutro Abs: 3.5 10*3/uL (ref 1.7–7.7)
Neutrophils Relative %: 43 %
Platelets: 177 10*3/uL (ref 150–400)
RBC: 3.2 MIL/uL — ABNORMAL LOW (ref 4.22–5.81)
RDW: 15.8 % — ABNORMAL HIGH (ref 11.5–15.5)
WBC: 7.8 10*3/uL (ref 4.0–10.5)
nRBC: 1.7 % — ABNORMAL HIGH (ref 0.0–0.2)

## 2021-04-18 MED ORDER — SODIUM CHLORIDE 0.9 % IV SOLN
300.0000 mg/m2 | Freq: Once | INTRAVENOUS | Status: AC
  Administered 2021-04-18: 680 mg via INTRAVENOUS
  Filled 2021-04-18: qty 34

## 2021-04-18 MED ORDER — SODIUM CHLORIDE 0.9 % IV SOLN
40.0000 mg | Freq: Once | INTRAVENOUS | Status: AC
Start: 2021-04-18 — End: 2021-04-18
  Administered 2021-04-18: 40 mg via INTRAVENOUS
  Filled 2021-04-18: qty 4

## 2021-04-18 MED ORDER — SODIUM CHLORIDE 0.9 % IV SOLN: Freq: Once | INTRAVENOUS | Status: AC

## 2021-04-18 MED ORDER — BORTEZOMIB CHEMO SQ INJECTION 3.5 MG (2.5MG/ML)
1.5000 mg/m2 | Freq: Once | INTRAMUSCULAR | Status: AC
  Administered 2021-04-18: 3.5 mg via SUBCUTANEOUS
  Filled 2021-04-18: qty 1.4

## 2021-04-18 MED ORDER — PALONOSETRON HCL INJECTION 0.25 MG/5ML
0.2500 mg | Freq: Once | INTRAVENOUS | Status: AC
  Administered 2021-04-18: 0.25 mg via INTRAVENOUS
  Filled 2021-04-18: qty 5

## 2021-04-18 NOTE — Progress Notes (Signed)
Patient presents today for Cytoxan and Velcade per MD order.  Vital signs within parameters for treatment.  Labs pending.  Patient has no new complaints since last treatment.  Labs reviewed, creatinine noted to be 3.28.  Dr. Delton Coombes notified, message received patient okay for treatment.   Peripheral IV started and blood return noted pre and post infusion.    Stable during Velcade administration without incident; injection site WNL; see MAR for injection details.  Patient tolerated procedure well and without incident.  Tolerated infusion without adverse affects.  Vital signs stable.  No complaints at this time.  Discharge from clinic ambulatory in stable condition.  Alert and oriented X 3.  Follow up with [facility] as scheduled.

## 2021-04-18 NOTE — Patient Instructions (Signed)
[facility]  Discharge Instructions: Thank you for choosing [facility] to provide your oncology and hematology care.  If you have a lab appointment with the [facility], please come in thru the Main Entrance and check in at the main information desk.  Wear comfortable clothing and clothing appropriate for easy access to any Portacath or PICC line.   We strive to give you quality time with your provider. You may need to reschedule your appointment if you arrive late (15 or more minutes).  Arriving late affects you and other patients whose appointments are after yours.  Also, if you miss three or more appointments without notifying the office, you may be dismissed from the clinic at the provider's discretion.      For prescription refill requests, have your pharmacy contact our office and allow 72 hours for refills to be completed.    Today you received the following chemotherapy and/or immunotherapy agents Cytoxan infusion/Velcade injection      To help prevent nausea and vomiting after your treatment, we encourage you to take your nausea medication as directed.  BELOW ARE SYMPTOMS THAT SHOULD BE REPORTED IMMEDIATELY: *FEVER GREATER THAN 100.4 F (38 C) OR HIGHER *CHILLS OR SWEATING *NAUSEA AND VOMITING THAT IS NOT CONTROLLED WITH YOUR NAUSEA MEDICATION *UNUSUAL SHORTNESS OF BREATH *UNUSUAL BRUISING OR BLEEDING *URINARY PROBLEMS (pain or burning when urinating, or frequent urination) *BOWEL PROBLEMS (unusual diarrhea, constipation, pain near the anus) TENDERNESS IN MOUTH AND THROAT WITH OR WITHOUT PRESENCE OF ULCERS (sore throat, sores in mouth, or a toothache) UNUSUAL RASH, SWELLING OR PAIN  UNUSUAL VAGINAL DISCHARGE OR ITCHING   Items with * indicate a potential emergency and should be followed up as soon as possible or go to the Emergency Department if any problems should occur.  Please show the CHEMOTHERAPY ALERT CARD or IMMUNOTHERAPY ALERT CARD at  check-in to the Emergency Department and triage nurse.  Should you have questions after your visit or need to cancel or reschedule your appointment, please contact [facility] [phone]  and follow the prompts.  Office hours are 8:00 a.m. to 4:30 p.m. Monday - Friday. Please note that voicemails left after 4:00 p.m. may not be returned until the following business day.  We are closed weekends and major holidays. You have access to a nurse at all times for urgent questions. Please call the main number to the clinic [phone] and follow the prompts.  For any non-urgent questions, you may also contact your provider using MyChart. We now offer e-Visits for anyone 65 and older to request care online for non-urgent symptoms. For details visit [email or web address].   Also download the MyChart app! Go to the app store, search "MyChart", open the app, select [facility], and log in with your MyChart username and password.  Due to Covid, a mask is required upon entering the hospital/clinic. If you do not have a mask, one will be given to you upon arrival. For doctor visits, patients may have 1 support person aged 16 or older with them. For treatment visits, patients cannot have anyone with them due to current Covid guidelines and our immunocompromised population.

## 2021-04-25 ENCOUNTER — Inpatient Hospital Stay (HOSPITAL_COMMUNITY): Payer: Medicare HMO

## 2021-04-25 ENCOUNTER — Other Ambulatory Visit: Payer: Self-pay

## 2021-04-25 VITALS — BP 109/62 | HR 87 | Temp 96.7°F | Resp 18 | Wt 245.0 lb

## 2021-04-25 DIAGNOSIS — T451X5A Adverse effect of antineoplastic and immunosuppressive drugs, initial encounter: Secondary | ICD-10-CM | POA: Diagnosis not present

## 2021-04-25 DIAGNOSIS — Z5111 Encounter for antineoplastic chemotherapy: Secondary | ICD-10-CM | POA: Diagnosis not present

## 2021-04-25 DIAGNOSIS — Z7952 Long term (current) use of systemic steroids: Secondary | ICD-10-CM | POA: Diagnosis not present

## 2021-04-25 DIAGNOSIS — N184 Chronic kidney disease, stage 4 (severe): Secondary | ICD-10-CM | POA: Diagnosis not present

## 2021-04-25 DIAGNOSIS — C9000 Multiple myeloma not having achieved remission: Secondary | ICD-10-CM

## 2021-04-25 DIAGNOSIS — D6481 Anemia due to antineoplastic chemotherapy: Secondary | ICD-10-CM | POA: Diagnosis not present

## 2021-04-25 DIAGNOSIS — M7989 Other specified soft tissue disorders: Secondary | ICD-10-CM | POA: Diagnosis not present

## 2021-04-25 DIAGNOSIS — M2550 Pain in unspecified joint: Secondary | ICD-10-CM | POA: Diagnosis not present

## 2021-04-25 DIAGNOSIS — R600 Localized edema: Secondary | ICD-10-CM | POA: Diagnosis not present

## 2021-04-25 DIAGNOSIS — Z5112 Encounter for antineoplastic immunotherapy: Secondary | ICD-10-CM | POA: Diagnosis not present

## 2021-04-25 LAB — COMPREHENSIVE METABOLIC PANEL
ALT: 23 U/L (ref 0–44)
AST: 12 U/L — ABNORMAL LOW (ref 15–41)
Albumin: 3.3 g/dL — ABNORMAL LOW (ref 3.5–5.0)
Alkaline Phosphatase: 31 U/L — ABNORMAL LOW (ref 38–126)
Anion gap: 6 (ref 5–15)
BUN: 32 mg/dL — ABNORMAL HIGH (ref 8–23)
CO2: 21 mmol/L — ABNORMAL LOW (ref 22–32)
Calcium: 8.9 mg/dL (ref 8.9–10.3)
Chloride: 109 mmol/L (ref 98–111)
Creatinine, Ser: 3.19 mg/dL — ABNORMAL HIGH (ref 0.61–1.24)
GFR, Estimated: 21 mL/min — ABNORMAL LOW (ref >60)
Glucose, Bld: 118 mg/dL — ABNORMAL HIGH (ref 70–99)
Potassium: 4.2 mmol/L (ref 3.5–5.1)
Sodium: 136 mmol/L (ref 135–145)
Total Bilirubin: 0.6 mg/dL (ref 0.3–1.2)
Total Protein: 6.9 g/dL (ref 6.5–8.1)

## 2021-04-25 LAB — CBC WITH DIFFERENTIAL/PLATELET
Abs Immature Granulocytes: 0.09 10*3/uL — ABNORMAL HIGH (ref 0.00–0.07)
Basophils Absolute: 0.1 10*3/uL (ref 0.0–0.1)
Basophils Relative: 1 %
Eosinophils Absolute: 0.2 10*3/uL (ref 0.0–0.5)
Eosinophils Relative: 2 %
HCT: 30.1 % — ABNORMAL LOW (ref 39.0–52.0)
Hemoglobin: 9.9 g/dL — ABNORMAL LOW (ref 13.0–17.0)
Immature Granulocytes: 1 %
Lymphocytes Relative: 25 %
Lymphs Abs: 2.5 10*3/uL (ref 0.7–4.0)
MCH: 32.4 pg (ref 26.0–34.0)
MCHC: 32.9 g/dL (ref 30.0–36.0)
MCV: 98.4 fL (ref 80.0–100.0)
Monocytes Absolute: 1.1 10*3/uL — ABNORMAL HIGH (ref 0.1–1.0)
Monocytes Relative: 11 %
Neutro Abs: 6 10*3/uL (ref 1.7–7.7)
Neutrophils Relative %: 60 %
Platelets: 184 10*3/uL (ref 150–400)
RBC: 3.06 MIL/uL — ABNORMAL LOW (ref 4.22–5.81)
RDW: 16.4 % — ABNORMAL HIGH (ref 11.5–15.5)
WBC: 9.9 10*3/uL (ref 4.0–10.5)
nRBC: 2.4 % — ABNORMAL HIGH (ref 0.0–0.2)

## 2021-04-25 MED ORDER — SODIUM CHLORIDE 0.9 % IV SOLN: Freq: Once | INTRAVENOUS | Status: AC

## 2021-04-25 MED ORDER — PALONOSETRON HCL INJECTION 0.25 MG/5ML
0.2500 mg | Freq: Once | INTRAVENOUS | Status: AC
  Administered 2021-04-25: 0.25 mg via INTRAVENOUS
  Filled 2021-04-25: qty 5

## 2021-04-25 MED ORDER — SODIUM CHLORIDE 0.9 % IV SOLN
300.0000 mg/m2 | Freq: Once | INTRAVENOUS | Status: AC
  Administered 2021-04-25: 680 mg via INTRAVENOUS
  Filled 2021-04-25: qty 34

## 2021-04-25 MED ORDER — SODIUM CHLORIDE 0.9 % IV SOLN
40.0000 mg | Freq: Once | INTRAVENOUS | Status: AC
  Administered 2021-04-25: 40 mg via INTRAVENOUS
  Filled 2021-04-25: qty 4

## 2021-04-25 MED ORDER — BORTEZOMIB CHEMO SQ INJECTION 3.5 MG (2.5MG/ML)
1.5000 mg/m2 | Freq: Once | INTRAMUSCULAR | Status: AC
  Administered 2021-04-25: 3.5 mg via SUBCUTANEOUS
  Filled 2021-04-25: qty 1.4

## 2021-04-25 NOTE — Progress Notes (Signed)
Patient presents today for Velcade and Cytoxan per providers orders.  Vital signs within parameters for treatment.  Labs reviewed and creatinine noted to be elevate, MD aware.  Message from Dr. Alvy Bimler okay to proceed with treatment.  Velcade injection and Cytoxan infusion given today per MD orders.  Stable during injection and infusion without adverse affects.  Injection site WNL.  Vital signs stable.  No complaints at this time.  Discharge from clinic ambulatory in stable condition.  Alert and oriented X 3.  Follow up with [facility] as scheduled.

## 2021-04-25 NOTE — Patient Instructions (Signed)
[facility]  Discharge Instructions: Thank you for choosing [facility] to provide your oncology and hematology care.  If you have a lab appointment with the Cancer Center, please come in thru the Main Entrance and check in at the main information desk.  Wear comfortable clothing and clothing appropriate for easy access to any Portacath or PICC line.   We strive to give you quality time with your provider. You may need to reschedule your appointment if you arrive late (15 or more minutes).  Arriving late affects you and other patients whose appointments are after yours.  Also, if you miss three or more appointments without notifying the office, you may be dismissed from the clinic at the provider's discretion.      For prescription refill requests, have your pharmacy contact our office and allow 72 hours for refills to be completed.    Today you received the following chemotherapy and/or immunotherapy agents Cytoxan and Velcade.   To help prevent nausea and vomiting after your treatment, we encourage you to take your nausea medication as directed.  BELOW ARE SYMPTOMS THAT SHOULD BE REPORTED IMMEDIATELY: *FEVER GREATER THAN 100.4 F (38 C) OR HIGHER *CHILLS OR SWEATING *NAUSEA AND VOMITING THAT IS NOT CONTROLLED WITH YOUR NAUSEA MEDICATION *UNUSUAL SHORTNESS OF BREATH *UNUSUAL BRUISING OR BLEEDING *URINARY PROBLEMS (pain or burning when urinating, or frequent urination) *BOWEL PROBLEMS (unusual diarrhea, constipation, pain near the anus) TENDERNESS IN MOUTH AND THROAT WITH OR WITHOUT PRESENCE OF ULCERS (sore throat, sores in mouth, or a toothache) UNUSUAL RASH, SWELLING OR PAIN  UNUSUAL VAGINAL DISCHARGE OR ITCHING   Items with * indicate a potential emergency and should be followed up as soon as possible or go to the Emergency Department if any problems should occur.  Please show the CHEMOTHERAPY ALERT CARD or IMMUNOTHERAPY ALERT CARD at check-in to the  Emergency Department and triage nurse.  Should you have questions after your visit or need to cancel or reschedule your appointment, please contact [facility] [phone]  and follow the prompts.  Office hours are 8:00 a.m. to 4:30 p.m. Monday - Friday. Please note that voicemails left after 4:00 p.m. may not be returned until the following business day.  We are closed weekends and major holidays. You have access to a nurse at all times for urgent questions. Please call the main number to the clinic [phone] and follow the prompts.  For any non-urgent questions, you may also contact your provider using MyChart. We now offer e-Visits for anyone 18 and older to request care online for non-urgent symptoms. For details visit [email or web address].   Also download the MyChart app! Go to the app store, search "MyChart", open the app, select [facility], and log in with your MyChart username and password.  Due to Covid, a mask is required upon entering the hospital/clinic. If you do not have a mask, one will be given to you upon arrival. For doctor visits, patients may have 1 support person aged 18 or older with them. For treatment visits, patients cannot have anyone with them due to current Covid guidelines and our immunocompromised population.  

## 2021-05-01 DIAGNOSIS — E872 Acidosis: Secondary | ICD-10-CM | POA: Diagnosis not present

## 2021-05-01 DIAGNOSIS — N185 Chronic kidney disease, stage 5: Secondary | ICD-10-CM | POA: Diagnosis not present

## 2021-05-01 DIAGNOSIS — N189 Chronic kidney disease, unspecified: Secondary | ICD-10-CM | POA: Diagnosis not present

## 2021-05-01 DIAGNOSIS — N08 Glomerular disorders in diseases classified elsewhere: Secondary | ICD-10-CM | POA: Diagnosis not present

## 2021-05-01 DIAGNOSIS — C9000 Multiple myeloma not having achieved remission: Secondary | ICD-10-CM | POA: Diagnosis not present

## 2021-05-01 DIAGNOSIS — I120 Hypertensive chronic kidney disease with stage 5 chronic kidney disease or end stage renal disease: Secondary | ICD-10-CM | POA: Diagnosis not present

## 2021-05-01 DIAGNOSIS — D631 Anemia in chronic kidney disease: Secondary | ICD-10-CM | POA: Diagnosis not present

## 2021-05-01 DIAGNOSIS — R809 Proteinuria, unspecified: Secondary | ICD-10-CM | POA: Diagnosis not present

## 2021-05-01 DIAGNOSIS — E211 Secondary hyperparathyroidism, not elsewhere classified: Secondary | ICD-10-CM | POA: Diagnosis not present

## 2021-05-02 ENCOUNTER — Inpatient Hospital Stay (HOSPITAL_COMMUNITY): Payer: Medicare HMO

## 2021-05-02 ENCOUNTER — Other Ambulatory Visit: Payer: Self-pay

## 2021-05-02 ENCOUNTER — Encounter (HOSPITAL_COMMUNITY): Payer: Self-pay | Admitting: Hematology and Oncology

## 2021-05-02 ENCOUNTER — Inpatient Hospital Stay (HOSPITAL_BASED_OUTPATIENT_CLINIC_OR_DEPARTMENT_OTHER): Payer: Medicare HMO | Admitting: Hematology and Oncology

## 2021-05-02 VITALS — BP 104/64 | HR 83 | Temp 97.0°F | Resp 18

## 2021-05-02 DIAGNOSIS — C9000 Multiple myeloma not having achieved remission: Secondary | ICD-10-CM

## 2021-05-02 DIAGNOSIS — D6481 Anemia due to antineoplastic chemotherapy: Secondary | ICD-10-CM | POA: Diagnosis not present

## 2021-05-02 DIAGNOSIS — Z5112 Encounter for antineoplastic immunotherapy: Secondary | ICD-10-CM | POA: Diagnosis not present

## 2021-05-02 DIAGNOSIS — M2550 Pain in unspecified joint: Secondary | ICD-10-CM | POA: Diagnosis not present

## 2021-05-02 DIAGNOSIS — T451X5A Adverse effect of antineoplastic and immunosuppressive drugs, initial encounter: Secondary | ICD-10-CM

## 2021-05-02 DIAGNOSIS — R600 Localized edema: Secondary | ICD-10-CM | POA: Insufficient documentation

## 2021-05-02 DIAGNOSIS — Z5111 Encounter for antineoplastic chemotherapy: Secondary | ICD-10-CM | POA: Diagnosis not present

## 2021-05-02 DIAGNOSIS — M7989 Other specified soft tissue disorders: Secondary | ICD-10-CM | POA: Diagnosis not present

## 2021-05-02 DIAGNOSIS — Z7952 Long term (current) use of systemic steroids: Secondary | ICD-10-CM | POA: Diagnosis not present

## 2021-05-02 DIAGNOSIS — N184 Chronic kidney disease, stage 4 (severe): Secondary | ICD-10-CM | POA: Diagnosis not present

## 2021-05-02 LAB — COMPREHENSIVE METABOLIC PANEL
ALT: 25 U/L (ref 0–44)
AST: 16 U/L (ref 15–41)
Albumin: 3.4 g/dL — ABNORMAL LOW (ref 3.5–5.0)
Alkaline Phosphatase: 33 U/L — ABNORMAL LOW (ref 38–126)
Anion gap: 6 (ref 5–15)
BUN: 32 mg/dL — ABNORMAL HIGH (ref 8–23)
CO2: 23 mmol/L (ref 22–32)
Calcium: 8.4 mg/dL — ABNORMAL LOW (ref 8.9–10.3)
Chloride: 107 mmol/L (ref 98–111)
Creatinine, Ser: 3.06 mg/dL — ABNORMAL HIGH (ref 0.61–1.24)
GFR, Estimated: 22 mL/min — ABNORMAL LOW (ref >60)
Glucose, Bld: 100 mg/dL — ABNORMAL HIGH (ref 70–99)
Potassium: 4.1 mmol/L (ref 3.5–5.1)
Sodium: 136 mmol/L (ref 135–145)
Total Bilirubin: 0.4 mg/dL (ref 0.3–1.2)
Total Protein: 7.3 g/dL (ref 6.5–8.1)

## 2021-05-02 LAB — CBC WITH DIFFERENTIAL/PLATELET
Abs Immature Granulocytes: 0.08 10*3/uL — ABNORMAL HIGH (ref 0.00–0.07)
Basophils Absolute: 0.1 10*3/uL (ref 0.0–0.1)
Basophils Relative: 1 %
Eosinophils Absolute: 0.2 10*3/uL (ref 0.0–0.5)
Eosinophils Relative: 3 %
HCT: 31.5 % — ABNORMAL LOW (ref 39.0–52.0)
Hemoglobin: 10.2 g/dL — ABNORMAL LOW (ref 13.0–17.0)
Immature Granulocytes: 1 %
Lymphocytes Relative: 33 %
Lymphs Abs: 2.4 10*3/uL (ref 0.7–4.0)
MCH: 32.5 pg (ref 26.0–34.0)
MCHC: 32.4 g/dL (ref 30.0–36.0)
MCV: 100.3 fL — ABNORMAL HIGH (ref 80.0–100.0)
Monocytes Absolute: 1.2 10*3/uL — ABNORMAL HIGH (ref 0.1–1.0)
Monocytes Relative: 16 %
Neutro Abs: 3.3 10*3/uL (ref 1.7–7.7)
Neutrophils Relative %: 46 %
Platelets: 156 10*3/uL (ref 150–400)
RBC: 3.14 MIL/uL — ABNORMAL LOW (ref 4.22–5.81)
RDW: 16.5 % — ABNORMAL HIGH (ref 11.5–15.5)
WBC: 7.1 10*3/uL (ref 4.0–10.5)
nRBC: 2.9 % — ABNORMAL HIGH (ref 0.0–0.2)

## 2021-05-02 MED ORDER — BORTEZOMIB CHEMO SQ INJECTION 3.5 MG (2.5MG/ML)
1.5000 mg/m2 | Freq: Once | INTRAMUSCULAR | Status: AC
  Administered 2021-05-02: 3.5 mg via SUBCUTANEOUS
  Filled 2021-05-02: qty 1.4

## 2021-05-02 MED ORDER — PALONOSETRON HCL INJECTION 0.25 MG/5ML
0.2500 mg | Freq: Once | INTRAVENOUS | Status: AC
  Administered 2021-05-02: 0.25 mg via INTRAVENOUS
  Filled 2021-05-02: qty 5

## 2021-05-02 MED ORDER — SODIUM CHLORIDE 0.9 % IV SOLN: Freq: Once | INTRAVENOUS | Status: AC

## 2021-05-02 MED ORDER — SODIUM CHLORIDE 0.9 % IV SOLN
300.0000 mg/m2 | Freq: Once | INTRAVENOUS | Status: AC
  Administered 2021-05-02: 680 mg via INTRAVENOUS
  Filled 2021-05-02: qty 34

## 2021-05-02 MED ORDER — SODIUM CHLORIDE 0.9 % IV SOLN
20.0000 mg | Freq: Once | INTRAVENOUS | Status: AC
  Administered 2021-05-02: 20 mg via INTRAVENOUS
  Filled 2021-05-02: qty 20

## 2021-05-02 NOTE — Assessment & Plan Note (Signed)
His kidney function is stable We will proceed with treatment as scheduled

## 2021-05-02 NOTE — Assessment & Plan Note (Signed)
This is likely due to recent treatment. The patient denies recent history of bleeding such as epistaxis, hematuria or hematochezia. He is asymptomatic from the anemia. I will observe for now.  He does not require transfusion now. I will continue the chemotherapy at current dose without dosage adjustment.  If the anemia gets progressive worse in the future, I might have to delay his treatment or adjust the chemotherapy dose.  

## 2021-05-02 NOTE — Progress Notes (Signed)
[name] OFFICE PROGRESS NOTE  Patient Care Team: Celene Squibb, MD as PCP - General (Internal Medicine) Brien Mates, RN as Oncology Nurse Navigator (Oncology)  ASSESSMENT & PLAN:  Multiple myeloma [facility]) So far, he tolerated treatment very well except for occasional joint pain as well as progressive weight gain and swelling We will proceed with treatment as scheduled I plan to reduce his premedication dexamethasone to 20 mg for the next 2 weeks   Anemia due to antineoplastic chemotherapy This is likely due to recent treatment. The patient denies recent history of bleeding such as epistaxis, hematuria or hematochezia. He is asymptomatic from the anemia. I will observe for now.  He does not require transfusion now. I will continue the chemotherapy at current dose without dosage adjustment.  If the anemia gets progressive worse in the future, I might have to delay his treatment or adjust the chemotherapy dose.   Chronic kidney disease (CKD), stage IV (severe) (HCC) His kidney function is stable We will proceed with treatment as scheduled  Bilateral leg edema He appears somewhat cushingoid His legs are swollen likely due to side effects of treatment As above, plan to reduce the dose of dexamethasone premedication He stated he is not taking much of prednisone I also recommend he stop taking amlodipine for a week to see if that would reduce his leg swelling  No orders of the defined types were placed in this encounter.   All questions were answered. The patient knows to call the clinic with any problems, questions or concerns. The total time spent in the appointment was 30 minutes encounter with patients including review of chart and various tests results, discussions about plan of care and coordination of care plan   Ernest Lark, MD 05/02/2021 10:38 AM  INTERVAL HISTORY: Please see below for problem oriented charting. He returns for chemotherapy and  follow-up He complains of leg swelling He denies nausea or side effects of treatment He has minimal joint pain since last time I saw him and only uses prednisone sporadically No recent infection, fever or chills  SUMMARY OF ONCOLOGIC HISTORY: Oncology History  Multiple myeloma ([place])  03/06/2021 Initial Diagnosis   Multiple myeloma ([place])    03/06/2021 Cancer Staging   Staging form: Plasma Cell Myeloma and Plasma Cell Disorders, AJCC 8th Edition - Clinical stage from 03/06/2021: RISS Stage II (Beta-2-microglobulin (mg/L): 5.5, Albumin (g/dL): 3.5, ISS: Stage III, High-risk cytogenetics: Absent, LDH: Normal) - Signed by Derek Jack, MD on 03/06/2021  Stage prefix: Initial diagnosis  Beta 2 microglobulin range (mg/L): Greater than or equal to 5.5  Albumin range (g/dL): Greater than or equal to 3.5  Cytogenetics: No abnormalities  Pretreatment monoclonal protein level in serum (M spike) (g/dL): 2  Pretreatment serum free kappa light chain level (g/L): 794  Pretreatment serum free lambda light chain level (g/L): 15.9    03/14/2021 -  Chemotherapy    Patient is on Treatment Plan: MULTIPLE MYELOMA CYBORD - WEEKLY BORTEZOMIB         REVIEW OF SYSTEMS:   Constitutional: Denies fevers, chills or abnormal weight loss Eyes: Denies blurriness of vision Ears, nose, mouth, throat, and face: Denies mucositis or sore throat Respiratory: Denies cough, dyspnea or wheezes Cardiovascular: Denies palpitation, chest discomfort  Gastrointestinal:  Denies nausea, heartburn or change in bowel habits Skin: Denies abnormal skin rashes Lymphatics: Denies new lymphadenopathy or easy bruising Neurological:Denies numbness, tingling or new weaknesses Behavioral/Psych: Mood is stable, no new changes  All other systems  were reviewed with the patient and are negative.  I have reviewed the past medical history, past surgical history, social history and family history with the patient and they are  unchanged from previous note.  ALLERGIES:  has No Known Allergies.  MEDICATIONS:  Current Outpatient Medications  Medication Sig Dispense Refill   amLODipine (NORVASC) 5 MG tablet Take 5 mg by mouth daily.     aspirin EC 81 MG tablet Take 1 tablet (81 mg total) by mouth daily. 30 tablet 11   bortezomib IV (VELCADE) 3.5 MG injection Inject 1.5 mg/m2 into the vein once a week.     calcitRIOL (ROCALTROL) 0.25 MCG capsule Take 0.25 mcg by mouth every Monday, Wednesday, and Friday.     cholecalciferol (VITAMIN D3) 25 MCG (1000 UNIT) tablet Take 1,000 Units by mouth daily.     CYCLOPHOSPHAMIDE IV Inject 300 mg/m2 into the vein once a week.     dexamethasone 40 mg in sodium chloride 0.9 % 50 mL Inject 40 mg into the vein once a week.     Multiple Vitamin (MULTIVITAMIN) tablet Take by mouth.     Omega-3 Fatty Acids (FISH OIL) 1000 MG CAPS Take 1,000 mg by mouth in the morning, at noon, and at bedtime.      pantoprazole (PROTONIX) 40 MG tablet Take 1 tablet (40 mg total) by mouth 2 (two) times daily. 180 tablet 3   pravastatin (PRAVACHOL) 40 MG tablet Take 40 mg by mouth daily.     predniSONE (DELTASONE) 10 MG tablet Take 1 tablet (10 mg total) by mouth daily with breakfast. 7 tablet 0   sodium bicarbonate 650 MG tablet Take 1,300 mg by mouth 3 (three) times daily.     tamsulosin (FLOMAX) 0.4 MG CAPS capsule Take 0.4 mg by mouth daily.      polyethylene glycol (MIRALAX / GLYCOLAX) packet Take 17 g by mouth daily. Patient states that he takes as needed. (Patient not taking: Reported on 05/02/2021) 30 each 5   prochlorperazine (COMPAZINE) 10 MG tablet Take 1 tablet (10 mg total) by mouth every 6 (six) hours as needed for nausea or vomiting. (Patient not taking: Reported on 05/02/2021) 30 tablet 3   No current facility-administered medications for this visit.    PHYSICAL EXAMINATION: ECOG PERFORMANCE STATUS: 1 - Symptomatic but completely ambulatory  Vitals:   05/02/21 0903  BP: 110/67  Pulse: 87   Resp: 18  Temp: (!) 97 F (36.1 C)  SpO2: 98%   Filed Weights   05/02/21 0903  Weight: 242 lb 14.4 oz (110.2 kg)    GENERAL:alert, no distress and comfortable.  He looks cushingoid SKIN: skin color, texture, turgor are normal, no rashes or significant lesions EYES: normal, Conjunctiva are pink and non-injected, sclera clear OROPHARYNX:no exudate, no erythema and lips, buccal mucosa, and tongue normal  NECK: supple, thyroid normal size, non-tender, without nodularity LYMPH:  no palpable lymphadenopathy in the cervical, axillary or inguinal LUNGS: clear to auscultation and percussion with normal breathing effort HEART: regular rate & rhythm and no murmurs with moderate bilateral lower extremity edema ABDOMEN:abdomen soft, non-tender and normal bowel sounds Musculoskeletal:no cyanosis of digits and no clubbing  NEURO: alert & oriented x 3 with fluent speech, no focal motor/sensory deficits  LABORATORY DATA:  I have reviewed the data as listed    Component Value Date/Time   NA 136 05/02/2021 0805   K 4.1 05/02/2021 0805   CL 107 05/02/2021 0805   CO2 23 05/02/2021 0805   GLUCOSE  100 (H) 05/02/2021 0805   BUN 32 (H) 05/02/2021 0805   CREATININE 3.06 (H) 05/02/2021 0805   CALCIUM 8.4 (L) 05/02/2021 0805   PROT 7.3 05/02/2021 0805   ALBUMIN 3.4 (L) 05/02/2021 0805   AST 16 05/02/2021 0805   ALT 25 05/02/2021 0805   ALKPHOS 33 (L) 05/02/2021 0805   BILITOT 0.4 05/02/2021 0805   GFRNONAA 22 (L) 05/02/2021 0805   GFRAA 73 (L) 10/06/2012 0925    No results found for: SPEP, UPEP  Lab Results  Component Value Date   WBC 7.1 05/02/2021   NEUTROABS 3.3 05/02/2021   HGB 10.2 (L) 05/02/2021   HCT 31.5 (L) 05/02/2021   MCV 100.3 (H) 05/02/2021   PLT 156 05/02/2021      Chemistry      Component Value Date/Time   NA 136 05/02/2021 0805   K 4.1 05/02/2021 0805   CL 107 05/02/2021 0805   CO2 23 05/02/2021 0805   BUN 32 (H) 05/02/2021 0805   CREATININE 3.06 (H) 05/02/2021  0805      Component Value Date/Time   CALCIUM 8.4 (L) 05/02/2021 0805   ALKPHOS 33 (L) 05/02/2021 0805   AST 16 05/02/2021 0805   ALT 25 05/02/2021 0805   BILITOT 0.4 05/02/2021 0805

## 2021-05-02 NOTE — Progress Notes (Signed)
Patient presents today for treatment and follow up visit with Dr. Alvy Bimler. Vital signs within parameters for today's treatment. Patient denies pain . Patient denies any significant changes since his last treatment. Patient denies fatigue, nausea, diarrhea, and rashes.  Patient has complaints of his ankles swelling bilateral that he states is worse since his last treatment. 2+ pitting edema noted bilateral ankles.  Creatinine today 3.06. CKD diagnosis.   Patient seen by Dr. Alvy Bimler today.  Labs reviewed by MD. Per note we will proceed with treatment today. Decadron 40 mgs decreased to 20 mgs IV per Dr. Calton Dach note.    Treatment given today per MD orders. Tolerated infusion without adverse affects. Vital signs stable. No complaints at this time. Discharged from clinic ambulatory in stable condition. Alert and oriented x 3. F/U with [facility] as scheduled.

## 2021-05-02 NOTE — Patient Instructions (Signed)
[facility]  Discharge Instructions: Thank you for choosing [facility] to provide your oncology and hematology care.  If you have a lab appointment with the Cancer Center, please come in thru the Main Entrance and check in at the main information desk.  Wear comfortable clothing and clothing appropriate for easy access to any Portacath or PICC line.   We strive to give you quality time with your provider. You may need to reschedule your appointment if you arrive late (15 or more minutes).  Arriving late affects you and other patients whose appointments are after yours.  Also, if you miss three or more appointments without notifying the office, you may be dismissed from the clinic at the provider's discretion.      For prescription refill requests, have your pharmacy contact our office and allow 72 hours for refills to be completed.    Today you received the following chemotherapy and/or immunotherapy agents Cytoxan and Velcade.   To help prevent nausea and vomiting after your treatment, we encourage you to take your nausea medication as directed.  BELOW ARE SYMPTOMS THAT SHOULD BE REPORTED IMMEDIATELY: *FEVER GREATER THAN 100.4 F (38 C) OR HIGHER *CHILLS OR SWEATING *NAUSEA AND VOMITING THAT IS NOT CONTROLLED WITH YOUR NAUSEA MEDICATION *UNUSUAL SHORTNESS OF BREATH *UNUSUAL BRUISING OR BLEEDING *URINARY PROBLEMS (pain or burning when urinating, or frequent urination) *BOWEL PROBLEMS (unusual diarrhea, constipation, pain near the anus) TENDERNESS IN MOUTH AND THROAT WITH OR WITHOUT PRESENCE OF ULCERS (sore throat, sores in mouth, or a toothache) UNUSUAL RASH, SWELLING OR PAIN  UNUSUAL VAGINAL DISCHARGE OR ITCHING   Items with * indicate a potential emergency and should be followed up as soon as possible or go to the Emergency Department if any problems should occur.  Please show the CHEMOTHERAPY ALERT CARD or IMMUNOTHERAPY ALERT CARD at check-in to the  Emergency Department and triage nurse.  Should you have questions after your visit or need to cancel or reschedule your appointment, please contact [facility] [phone]  and follow the prompts.  Office hours are 8:00 a.m. to 4:30 p.m. Monday - Friday. Please note that voicemails left after 4:00 p.m. may not be returned until the following business day.  We are closed weekends and major holidays. You have access to a nurse at all times for urgent questions. Please call the main number to the clinic [phone] and follow the prompts.  For any non-urgent questions, you may also contact your provider using MyChart. We now offer e-Visits for anyone 18 and older to request care online for non-urgent symptoms. For details visit [email or web address].   Also download the MyChart app! Go to the app store, search "MyChart", open the app, select [facility], and log in with your MyChart username and password.  Due to Covid, a mask is required upon entering the hospital/clinic. If you do not have a mask, one will be given to you upon arrival. For doctor visits, patients may have 1 support person aged 18 or older with them. For treatment visits, patients cannot have anyone with them due to current Covid guidelines and our immunocompromised population.  

## 2021-05-02 NOTE — Assessment & Plan Note (Signed)
So far, he tolerated treatment very well except for occasional joint pain as well as progressive weight gain and swelling We will proceed with treatment as scheduled I plan to reduce his premedication dexamethasone to 20 mg for the next 2 weeks

## 2021-05-02 NOTE — Assessment & Plan Note (Signed)
He appears somewhat cushingoid His legs are swollen likely due to side effects of treatment As above, plan to reduce the dose of dexamethasone premedication He stated he is not taking much of prednisone I also recommend he stop taking amlodipine for a week to see if that would reduce his leg swelling

## 2021-05-04 ENCOUNTER — Other Ambulatory Visit (HOSPITAL_COMMUNITY): Payer: Self-pay

## 2021-05-04 DIAGNOSIS — C9000 Multiple myeloma not having achieved remission: Secondary | ICD-10-CM

## 2021-05-04 DIAGNOSIS — N08 Glomerular disorders in diseases classified elsewhere: Secondary | ICD-10-CM | POA: Diagnosis not present

## 2021-05-04 DIAGNOSIS — D631 Anemia in chronic kidney disease: Secondary | ICD-10-CM | POA: Diagnosis not present

## 2021-05-04 DIAGNOSIS — N189 Chronic kidney disease, unspecified: Secondary | ICD-10-CM | POA: Diagnosis not present

## 2021-05-04 DIAGNOSIS — R600 Localized edema: Secondary | ICD-10-CM | POA: Diagnosis not present

## 2021-05-04 DIAGNOSIS — E211 Secondary hyperparathyroidism, not elsewhere classified: Secondary | ICD-10-CM | POA: Diagnosis not present

## 2021-05-04 DIAGNOSIS — R809 Proteinuria, unspecified: Secondary | ICD-10-CM | POA: Diagnosis not present

## 2021-05-04 DIAGNOSIS — I120 Hypertensive chronic kidney disease with stage 5 chronic kidney disease or end stage renal disease: Secondary | ICD-10-CM | POA: Diagnosis not present

## 2021-05-04 DIAGNOSIS — N185 Chronic kidney disease, stage 5: Secondary | ICD-10-CM | POA: Diagnosis not present

## 2021-05-04 DIAGNOSIS — E872 Acidosis: Secondary | ICD-10-CM | POA: Diagnosis not present

## 2021-05-09 ENCOUNTER — Other Ambulatory Visit (HOSPITAL_COMMUNITY): Payer: Self-pay | Admitting: Hematology and Oncology

## 2021-05-09 ENCOUNTER — Inpatient Hospital Stay (HOSPITAL_COMMUNITY): Payer: Medicare HMO | Attending: Hematology

## 2021-05-09 ENCOUNTER — Inpatient Hospital Stay (HOSPITAL_COMMUNITY): Payer: Medicare HMO

## 2021-05-09 ENCOUNTER — Other Ambulatory Visit: Payer: Self-pay

## 2021-05-09 VITALS — BP 115/73 | HR 84 | Temp 97.1°F | Resp 20 | Wt 242.2 lb

## 2021-05-09 DIAGNOSIS — Z5111 Encounter for antineoplastic chemotherapy: Secondary | ICD-10-CM | POA: Diagnosis present

## 2021-05-09 DIAGNOSIS — Z5112 Encounter for antineoplastic immunotherapy: Secondary | ICD-10-CM | POA: Insufficient documentation

## 2021-05-09 DIAGNOSIS — C9000 Multiple myeloma not having achieved remission: Secondary | ICD-10-CM | POA: Diagnosis present

## 2021-05-09 LAB — COMPREHENSIVE METABOLIC PANEL
ALT: 22 U/L (ref 0–44)
AST: 17 U/L (ref 15–41)
Albumin: 3.5 g/dL (ref 3.5–5.0)
Alkaline Phosphatase: 37 U/L — ABNORMAL LOW (ref 38–126)
Anion gap: 8 (ref 5–15)
BUN: 33 mg/dL — ABNORMAL HIGH (ref 8–23)
CO2: 24 mmol/L (ref 22–32)
Calcium: 8.7 mg/dL — ABNORMAL LOW (ref 8.9–10.3)
Chloride: 105 mmol/L (ref 98–111)
Creatinine, Ser: 3.32 mg/dL — ABNORMAL HIGH (ref 0.61–1.24)
GFR, Estimated: 20 mL/min — ABNORMAL LOW (ref >60)
Glucose, Bld: 99 mg/dL (ref 70–99)
Potassium: 4.1 mmol/L (ref 3.5–5.1)
Sodium: 137 mmol/L (ref 135–145)
Total Bilirubin: 0.2 mg/dL — ABNORMAL LOW (ref 0.3–1.2)
Total Protein: 7.6 g/dL (ref 6.5–8.1)

## 2021-05-09 LAB — CBC WITH DIFFERENTIAL/PLATELET
Abs Immature Granulocytes: 0.08 10*3/uL — ABNORMAL HIGH (ref 0.00–0.07)
Basophils Absolute: 0.1 10*3/uL (ref 0.0–0.1)
Basophils Relative: 1 %
Eosinophils Absolute: 0.2 10*3/uL (ref 0.0–0.5)
Eosinophils Relative: 3 %
HCT: 31.9 % — ABNORMAL LOW (ref 39.0–52.0)
Hemoglobin: 10.4 g/dL — ABNORMAL LOW (ref 13.0–17.0)
Immature Granulocytes: 1 %
Lymphocytes Relative: 37 %
Lymphs Abs: 2.8 10*3/uL (ref 0.7–4.0)
MCH: 32.5 pg (ref 26.0–34.0)
MCHC: 32.6 g/dL (ref 30.0–36.0)
MCV: 99.7 fL (ref 80.0–100.0)
Monocytes Absolute: 1.1 10*3/uL — ABNORMAL HIGH (ref 0.1–1.0)
Monocytes Relative: 15 %
Neutro Abs: 3.2 10*3/uL (ref 1.7–7.7)
Neutrophils Relative %: 43 %
Platelets: 176 10*3/uL (ref 150–400)
RBC: 3.2 MIL/uL — ABNORMAL LOW (ref 4.22–5.81)
RDW: 16.5 % — ABNORMAL HIGH (ref 11.5–15.5)
WBC: 7.6 10*3/uL (ref 4.0–10.5)
nRBC: 1.9 % — ABNORMAL HIGH (ref 0.0–0.2)

## 2021-05-09 LAB — MAGNESIUM: Magnesium: 2 mg/dL (ref 1.7–2.4)

## 2021-05-09 MED ORDER — PALONOSETRON HCL INJECTION 0.25 MG/5ML
0.2500 mg | Freq: Once | INTRAVENOUS | Status: AC
  Administered 2021-05-09: 0.25 mg via INTRAVENOUS
  Filled 2021-05-09: qty 5

## 2021-05-09 MED ORDER — SODIUM CHLORIDE 0.9 % IV SOLN
300.0000 mg/m2 | Freq: Once | INTRAVENOUS | Status: AC
  Administered 2021-05-09: 680 mg via INTRAVENOUS
  Filled 2021-05-09: qty 34

## 2021-05-09 MED ORDER — SODIUM CHLORIDE 0.9% FLUSH: 10.0000 mL | INTRAVENOUS | Status: DC | PRN

## 2021-05-09 MED ORDER — HEPARIN SOD (PORK) LOCK FLUSH 100 UNIT/ML IV SOLN: 500.0000 [IU] | Freq: Once | INTRAVENOUS | Status: DC | PRN

## 2021-05-09 MED ORDER — BORTEZOMIB CHEMO SQ INJECTION 3.5 MG (2.5MG/ML)
1.5000 mg/m2 | Freq: Once | INTRAMUSCULAR | Status: AC
  Administered 2021-05-09: 3.5 mg via SUBCUTANEOUS
  Filled 2021-05-09: qty 1.4

## 2021-05-09 MED ORDER — SODIUM CHLORIDE 0.9 % IV SOLN
20.0000 mg | Freq: Once | INTRAVENOUS | Status: AC
  Administered 2021-05-09: 20 mg via INTRAVENOUS
  Filled 2021-05-09: qty 20

## 2021-05-09 MED ORDER — SODIUM CHLORIDE 0.9 % IV SOLN: Freq: Once | INTRAVENOUS | Status: AC

## 2021-05-09 NOTE — Patient Instructions (Signed)
[facility]  Discharge Instructions: Thank you for choosing [facility] to provide your oncology and hematology care.  If you have a lab appointment with the Cancer Center, please come in thru the Main Entrance and check in at the main information desk.  Wear comfortable clothing and clothing appropriate for easy access to any Portacath or PICC line.   We strive to give you quality time with your provider. You may need to reschedule your appointment if you arrive late (15 or more minutes).  Arriving late affects you and other patients whose appointments are after yours.  Also, if you miss three or more appointments without notifying the office, you may be dismissed from the clinic at the provider's discretion.      For prescription refill requests, have your pharmacy contact our office and allow 72 hours for refills to be completed.    Today you received the following chemotherapy and/or immunotherapy agents Cytoxan and Velcade.   To help prevent nausea and vomiting after your treatment, we encourage you to take your nausea medication as directed.  BELOW ARE SYMPTOMS THAT SHOULD BE REPORTED IMMEDIATELY: *FEVER GREATER THAN 100.4 F (38 C) OR HIGHER *CHILLS OR SWEATING *NAUSEA AND VOMITING THAT IS NOT CONTROLLED WITH YOUR NAUSEA MEDICATION *UNUSUAL SHORTNESS OF BREATH *UNUSUAL BRUISING OR BLEEDING *URINARY PROBLEMS (pain or burning when urinating, or frequent urination) *BOWEL PROBLEMS (unusual diarrhea, constipation, pain near the anus) TENDERNESS IN MOUTH AND THROAT WITH OR WITHOUT PRESENCE OF ULCERS (sore throat, sores in mouth, or a toothache) UNUSUAL RASH, SWELLING OR PAIN  UNUSUAL VAGINAL DISCHARGE OR ITCHING   Items with * indicate a potential emergency and should be followed up as soon as possible or go to the Emergency Department if any problems should occur.  Please show the CHEMOTHERAPY ALERT CARD or IMMUNOTHERAPY ALERT CARD at check-in to the  Emergency Department and triage nurse.  Should you have questions after your visit or need to cancel or reschedule your appointment, please contact [facility] [phone]  and follow the prompts.  Office hours are 8:00 a.m. to 4:30 p.m. Monday - Friday. Please note that voicemails left after 4:00 p.m. may not be returned until the following business day.  We are closed weekends and major holidays. You have access to a nurse at all times for urgent questions. Please call the main number to the clinic [phone] and follow the prompts.  For any non-urgent questions, you may also contact your provider using MyChart. We now offer e-Visits for anyone 18 and older to request care online for non-urgent symptoms. For details visit [email or web address].   Also download the MyChart app! Go to the app store, search "MyChart", open the app, select , and log in with your MyChart username and password.  Due to Covid, a mask is required upon entering the hospital/clinic. If you do not have a mask, one will be given to you upon arrival. For doctor visits, patients may have 1 support person aged 18 or older with them. For treatment visits, patients cannot have anyone with them due to current Covid guidelines and our immunocompromised population.  

## 2021-05-09 NOTE — Progress Notes (Signed)
Patient presents today for Cytoxan infusion and Velcade injection per providers orders.  Vital signs within parameters for treatment.  Labs pending.  Patient has no new complaints since last treatment.  Patient states that his nephrologist started him on Lasix BID but he can not remember the milligrams.  He also states that he stopped taking his blood pressure medication like the doctor instructed him to do, but he can not remember the name of that medication either.  Labs reviewed and creatinine noted to be 3.32, MD notified.  Per Dr. Alvy Bimler okay to proceed with treatment.  Cytoxan and Velcade given today per MD orders.  Stable during infusion and injection without adverse affects.  Injection site WNL.  Vital signs stable.  No complaints at this time.  Discharge from clinic ambulatory in stable condition.  Alert and oriented X 3.  Follow up with [facility] as scheduled.

## 2021-05-16 ENCOUNTER — Inpatient Hospital Stay (HOSPITAL_COMMUNITY): Payer: Medicare HMO

## 2021-05-16 ENCOUNTER — Other Ambulatory Visit: Payer: Self-pay

## 2021-05-16 VITALS — BP 110/66 | HR 86 | Temp 96.9°F | Resp 18 | Wt 243.0 lb

## 2021-05-16 DIAGNOSIS — Z5112 Encounter for antineoplastic immunotherapy: Secondary | ICD-10-CM | POA: Diagnosis not present

## 2021-05-16 DIAGNOSIS — Z5111 Encounter for antineoplastic chemotherapy: Secondary | ICD-10-CM | POA: Diagnosis not present

## 2021-05-16 DIAGNOSIS — C9000 Multiple myeloma not having achieved remission: Secondary | ICD-10-CM | POA: Diagnosis not present

## 2021-05-16 LAB — CBC WITH DIFFERENTIAL/PLATELET
Abs Immature Granulocytes: 0.08 10*3/uL — ABNORMAL HIGH (ref 0.00–0.07)
Basophils Absolute: 0.1 10*3/uL (ref 0.0–0.1)
Basophils Relative: 1 %
Eosinophils Absolute: 0.2 10*3/uL (ref 0.0–0.5)
Eosinophils Relative: 3 %
HCT: 31.3 % — ABNORMAL LOW (ref 39.0–52.0)
Hemoglobin: 10.5 g/dL — ABNORMAL LOW (ref 13.0–17.0)
Immature Granulocytes: 1 %
Lymphocytes Relative: 37 %
Lymphs Abs: 2.5 10*3/uL (ref 0.7–4.0)
MCH: 32.9 pg (ref 26.0–34.0)
MCHC: 33.5 g/dL (ref 30.0–36.0)
MCV: 98.1 fL (ref 80.0–100.0)
Monocytes Absolute: 1 10*3/uL (ref 0.1–1.0)
Monocytes Relative: 15 %
Neutro Abs: 2.8 10*3/uL (ref 1.7–7.7)
Neutrophils Relative %: 43 %
Platelets: 184 10*3/uL (ref 150–400)
RBC: 3.19 MIL/uL — ABNORMAL LOW (ref 4.22–5.81)
RDW: 16.6 % — ABNORMAL HIGH (ref 11.5–15.5)
WBC: 6.7 10*3/uL (ref 4.0–10.5)
nRBC: 1.5 % — ABNORMAL HIGH (ref 0.0–0.2)

## 2021-05-16 LAB — COMPREHENSIVE METABOLIC PANEL
ALT: 25 U/L (ref 0–44)
AST: 16 U/L (ref 15–41)
Albumin: 3.7 g/dL (ref 3.5–5.0)
Alkaline Phosphatase: 37 U/L — ABNORMAL LOW (ref 38–126)
Anion gap: 8 (ref 5–15)
BUN: 35 mg/dL — ABNORMAL HIGH (ref 8–23)
CO2: 21 mmol/L — ABNORMAL LOW (ref 22–32)
Calcium: 8.6 mg/dL — ABNORMAL LOW (ref 8.9–10.3)
Chloride: 107 mmol/L (ref 98–111)
Creatinine, Ser: 3.61 mg/dL — ABNORMAL HIGH (ref 0.61–1.24)
GFR, Estimated: 18 mL/min — ABNORMAL LOW (ref >60)
Glucose, Bld: 100 mg/dL — ABNORMAL HIGH (ref 70–99)
Potassium: 4.1 mmol/L (ref 3.5–5.1)
Sodium: 136 mmol/L (ref 135–145)
Total Bilirubin: 0.8 mg/dL (ref 0.3–1.2)
Total Protein: 7.6 g/dL (ref 6.5–8.1)

## 2021-05-16 LAB — LACTATE DEHYDROGENASE: LDH: 210 U/L — ABNORMAL HIGH (ref 98–192)

## 2021-05-16 LAB — MAGNESIUM: Magnesium: 2.1 mg/dL (ref 1.7–2.4)

## 2021-05-16 MED ORDER — SODIUM CHLORIDE 0.9 % IV SOLN
300.0000 mg/m2 | Freq: Once | INTRAVENOUS | Status: AC
  Administered 2021-05-16: 680 mg via INTRAVENOUS
  Filled 2021-05-16: qty 34

## 2021-05-16 MED ORDER — SODIUM CHLORIDE 0.9 % IV SOLN
40.0000 mg | Freq: Once | INTRAVENOUS | Status: AC
  Administered 2021-05-16: 40 mg via INTRAVENOUS
  Filled 2021-05-16: qty 4

## 2021-05-16 MED ORDER — SODIUM CHLORIDE 0.9 % IV SOLN
Freq: Once | INTRAVENOUS | Status: AC
Start: 2021-05-16 — End: 2021-05-16

## 2021-05-16 MED ORDER — PALONOSETRON HCL INJECTION 0.25 MG/5ML
0.2500 mg | Freq: Once | INTRAVENOUS | Status: AC
  Administered 2021-05-16: 0.25 mg via INTRAVENOUS
  Filled 2021-05-16: qty 5

## 2021-05-16 MED ORDER — BORTEZOMIB CHEMO SQ INJECTION 3.5 MG (2.5MG/ML)
1.5000 mg/m2 | Freq: Once | INTRAMUSCULAR | Status: AC
  Administered 2021-05-16: 3.5 mg via SUBCUTANEOUS
  Filled 2021-05-16: qty 1.4

## 2021-05-16 NOTE — Patient Instructions (Signed)
[facility]  Discharge Instructions: Thank you for choosing [facility] to provide your oncology and hematology care.  If you have a lab appointment with the Cancer Center, please come in thru the Main Entrance and check in at the main information desk.  Wear comfortable clothing and clothing appropriate for easy access to any Portacath or PICC line.   We strive to give you quality time with your provider. You may need to reschedule your appointment if you arrive late (15 or more minutes).  Arriving late affects you and other patients whose appointments are after yours.  Also, if you miss three or more appointments without notifying the office, you may be dismissed from the clinic at the provider's discretion.      For prescription refill requests, have your pharmacy contact our office and allow 72 hours for refills to be completed.    Today you received the following chemotherapy and/or immunotherapy agents Cytoxan and Velcade.   To help prevent nausea and vomiting after your treatment, we encourage you to take your nausea medication as directed.  BELOW ARE SYMPTOMS THAT SHOULD BE REPORTED IMMEDIATELY: *FEVER GREATER THAN 100.4 F (38 C) OR HIGHER *CHILLS OR SWEATING *NAUSEA AND VOMITING THAT IS NOT CONTROLLED WITH YOUR NAUSEA MEDICATION *UNUSUAL SHORTNESS OF BREATH *UNUSUAL BRUISING OR BLEEDING *URINARY PROBLEMS (pain or burning when urinating, or frequent urination) *BOWEL PROBLEMS (unusual diarrhea, constipation, pain near the anus) TENDERNESS IN MOUTH AND THROAT WITH OR WITHOUT PRESENCE OF ULCERS (sore throat, sores in mouth, or a toothache) UNUSUAL RASH, SWELLING OR PAIN  UNUSUAL VAGINAL DISCHARGE OR ITCHING   Items with * indicate a potential emergency and should be followed up as soon as possible or go to the Emergency Department if any problems should occur.  Please show the CHEMOTHERAPY ALERT CARD or IMMUNOTHERAPY ALERT CARD at check-in to the  Emergency Department and triage nurse.  Should you have questions after your visit or need to cancel or reschedule your appointment, please contact [facility] [phone]  and follow the prompts.  Office hours are 8:00 a.m. to 4:30 p.m. Monday - Friday. Please note that voicemails left after 4:00 p.m. may not be returned until the following business day.  We are closed weekends and major holidays. You have access to a nurse at all times for urgent questions. Please call the main number to the clinic [phone] and follow the prompts.  For any non-urgent questions, you may also contact your provider using MyChart. We now offer e-Visits for anyone 18 and older to request care online for non-urgent symptoms. For details visit [email or web address].   Also download the MyChart app! Go to the app store, search "MyChart", open the app, select [facility], and log in with your MyChart username and password.  Due to Covid, a mask is required upon entering the hospital/clinic. If you do not have a mask, one will be given to you upon arrival. For doctor visits, patients may have 1 support person aged 18 or older with them. For treatment visits, patients cannot have anyone with them due to current Covid guidelines and our immunocompromised population.  

## 2021-05-16 NOTE — Progress Notes (Signed)
Patient presents today for Cytoxan and Velcade per providers order.  Vital signs within parameters for treatment.  Labs reviewed and creatinine noted to be 3.61, MD notified.  Message received from Dr. Delton Coombes, okay to proceed with treatment.  Peripheral IV started and blood return noted pre and post infusion.    Cytoxan and Velcade given today per MD orders.  Tolerated infusion without adverse affects.   Velcade administration without incident; injection site WNL; see MAR for injection details.  Discharge from clinic ambulatory in stable condition.  Alert and oriented X 3.  Follow up with [facility] as scheduled.

## 2021-05-16 NOTE — Progress Notes (Signed)
Ok to proceed with elevated serum creatinine per Dr Delton Coombes.  Henreitta Leber, PharmD

## 2021-05-17 LAB — KAPPA/LAMBDA LIGHT CHAINS
Kappa free light chain: 298.5 mg/L — ABNORMAL HIGH (ref 3.3–19.4)
Kappa, lambda light chain ratio: 25.08 — ABNORMAL HIGH (ref 0.26–1.65)
Lambda free light chains: 11.9 mg/L (ref 5.7–26.3)

## 2021-05-18 ENCOUNTER — Encounter (HOSPITAL_COMMUNITY): Payer: Self-pay

## 2021-05-18 LAB — PROTEIN ELECTROPHORESIS, SERUM
A/G Ratio: 1.2 (ref 0.7–1.7)
Albumin ELP: 4 g/dL (ref 2.9–4.4)
Alpha-1-Globulin: 0.2 g/dL (ref 0.0–0.4)
Alpha-2-Globulin: 0.7 g/dL (ref 0.4–1.0)
Beta Globulin: 0.8 g/dL (ref 0.7–1.3)
Gamma Globulin: 1.6 g/dL (ref 0.4–1.8)
Globulin, Total: 3.3 g/dL (ref 2.2–3.9)
M-Spike, %: 1 g/dL — ABNORMAL HIGH
Total Protein ELP: 7.3 g/dL (ref 6.0–8.5)

## 2021-05-18 NOTE — Progress Notes (Signed)
This patient called saying that his right leg from the knee down is swollen after treatment yesterday @ 4:49pm. Wants to know if the treatment he is on now can cause this or what he needs to do.  Dr. Tomie China response-  it is concerning for DVT. can he get evaluated in ER asap? Myeloma and treatments put them at high risk for DVT.  Patient is aware of Dr. Tomie China recommendation of going to the ER due to increased risk of DVT.  Patient states that both of his legs have been swelling but the right was worse yesterday evening. He states that the swelling has come down today and that he is going to wait and see if it changes. He states that if the swelling gets worse again, starts hurting or feeling warm to the touch he will go to the ER.

## 2021-05-21 LAB — IMMUNOFIXATION ELECTROPHORESIS
IgA: 26 mg/dL — ABNORMAL LOW (ref 61–437)
IgG (Immunoglobin G), Serum: 1576 mg/dL (ref 603–1613)
IgM (Immunoglobulin M), Srm: 12 mg/dL — ABNORMAL LOW (ref 20–172)
Total Protein ELP: 7.1 g/dL (ref 6.0–8.5)

## 2021-05-22 ENCOUNTER — Encounter: Payer: Self-pay | Admitting: Nurse Practitioner

## 2021-05-22 ENCOUNTER — Ambulatory Visit (INDEPENDENT_AMBULATORY_CARE_PROVIDER_SITE_OTHER): Payer: Medicare HMO | Admitting: Nurse Practitioner

## 2021-05-22 ENCOUNTER — Other Ambulatory Visit: Payer: Self-pay

## 2021-05-22 VITALS — BP 132/76 | HR 102 | Temp 97.4°F | Ht 67.0 in | Wt 243.0 lb

## 2021-05-22 DIAGNOSIS — R1319 Other dysphagia: Secondary | ICD-10-CM | POA: Diagnosis not present

## 2021-05-22 DIAGNOSIS — N185 Chronic kidney disease, stage 5: Secondary | ICD-10-CM

## 2021-05-22 DIAGNOSIS — D472 Monoclonal gammopathy: Secondary | ICD-10-CM | POA: Diagnosis not present

## 2021-05-22 DIAGNOSIS — Z0001 Encounter for general adult medical examination with abnormal findings: Secondary | ICD-10-CM

## 2021-05-22 DIAGNOSIS — C9000 Multiple myeloma not having achieved remission: Secondary | ICD-10-CM

## 2021-05-22 DIAGNOSIS — Z139 Encounter for screening, unspecified: Secondary | ICD-10-CM

## 2021-05-22 DIAGNOSIS — R351 Nocturia: Secondary | ICD-10-CM

## 2021-05-22 DIAGNOSIS — I10 Essential (primary) hypertension: Secondary | ICD-10-CM

## 2021-05-22 NOTE — Assessment & Plan Note (Signed)
-  has had weight gain since starting steroids -reviewed records from oncology

## 2021-05-22 NOTE — Assessment & Plan Note (Signed)
-  had recent leg swelling -he stopped amlodipine recently on advice of oncology for the leg swelling -avoid ACEi/ARB per nephrology recommendations

## 2021-05-22 NOTE — Assessment & Plan Note (Signed)
-  followed by Dr. Delton Coombes -undergoing treatment now

## 2021-05-22 NOTE — Assessment & Plan Note (Signed)
-  takes flomax for BPH -change lasix to 20 mg in the AM and 20 mg at lunch to prevent nocturia

## 2021-05-22 NOTE — Progress Notes (Signed)
New Patient Office Visit  Subjective:  Patient ID: Ernest Barnett, male    DOB: [birth year]  Age: 65 y.o. MRN: [id number]  CC:  Chief Complaint  Patient presents with   New Patient (Initial Visit)    Here to establish care. Complains about his fluid pill today, is up all through the night to urinate.     HPI Ernest Barnett presents for new patient visit. Transferring care from [name], MD. Last physical was over a year ago. Last labs were drawn last Wed. With [facility].  He is undergoing treatment for multiple myeloma at [facility]. He also sees Dr. [name] for CKD-4.  He had AV fistula created by Dr. Donnetta Hutching on 02/08/21 in case he needs hemodialysis in the future.  He had right leg swelling after his cancer treatment last week, but the swelling has since resolved.  Past Medical History:  Diagnosis Date   Chronic kidney disease    COPD (chronic obstructive pulmonary disease) ([place])    CVA (cerebral vascular accident) ([place]) 2013   GERD (gastroesophageal reflux disease)    HOH (hard of hearing)    Hypercholesteremia    Hypertension    Stroke ([place]) 03/24/2012   left sided weakness   Vitamin D deficiency     Past Surgical History:  Procedure Laterality Date   AV FISTULA PLACEMENT Right 02/08/2021   Procedure: RIGHT ARM ARTERIOVENOUS (AV) FISTULA CREATION;  Surgeon: [name], MD;  Location: [facility];  Service: Vascular;  Laterality: Right;   BIOPSY  11/12/2018   Procedure: BIOPSY;  Surgeon: Rogene Houston, MD;  Location: [facility];  Service: Endoscopy;;  colon   BIOPSY  05/10/2020   Procedure: BIOPSY;  Surgeon: Rogene Houston, MD;  Location: [facility];  Service: Endoscopy;;  esophagus   CATARACT EXTRACTION     right eye   CATARACT EXTRACTION W/PHACO  10/08/2012   Procedure: CATARACT EXTRACTION PHACO AND INTRAOCULAR LENS PLACEMENT ([place]);  Surgeon: Tonny Branch, MD;  Location: [facility];  Service: Ophthalmology;  Laterality: Left;  CDE:6.64   CHOLECYSTECTOMY      [place]   COLONOSCOPY N/A 11/12/2018   Procedure: COLONOSCOPY;  Surgeon: Rogene Houston, MD;  Location: [facility];  Service: Endoscopy;  Laterality: N/A;  1030   ELBOW FRACTURE SURGERY     left   ESOPHAGEAL DILATION N/A 11/12/2018   Procedure: ESOPHAGEAL DILATION;  Surgeon: Rogene Houston, MD;  [facility];  Service: Endoscopy;  Laterality: N/A;   ESOPHAGEAL DILATION N/A 05/10/2020   Procedure: ESOPHAGEAL DILATION;  Surgeon: Rogene Houston, MD;  [facility];  Service: Endoscopy;  Laterality: N/A;   ESOPHAGOGASTRODUODENOSCOPY N/A 11/12/2018   Procedure: ESOPHAGOGASTRODUODENOSCOPY (EGD);  Surgeon: Rogene Houston, MD;  [facility];  Service: Endoscopy;  Laterality: N/A;   ESOPHAGOGASTRODUODENOSCOPY N/A 05/10/2020   Procedure: ESOPHAGOGASTRODUODENOSCOPY (EGD);  Surgeon: Rogene Houston, MD;  [facility];  Service: Endoscopy;  Laterality: N/A;  210   HERNIA REPAIR     right inguinal   HYDROCELE EXCISION / REPAIR     POLYPECTOMY  11/12/2018   Procedure: POLYPECTOMY;  Surgeon: Rogene Houston, MD;  Location[facility];  Service: Endoscopy;;  colon    RETINAL DETACHMENT SURGERY Left 2019   SPLENECTOMY, TOTAL     TONSILLECTOMY     VOCAL CORD INJECTION     removal of polyp-2005    History reviewed. No pertinent family history.  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  Tobacco Use   Smoking status: Former    Packs/day: 2.00    Years: 40.00    Pack years: 80.00    Types: Cigarettes, E-cigarettes   Smokeless tobacco: Current   Tobacco comments:    Pt only vap now  Vaping Use   Vaping Use: Every day   Substances: Nicotine  Substance and Sexual Activity   Alcohol use: Yes     Comment: occassional- 1 beer, but usually less than 1 drink per day   Drug use: No   Sexual activity: Yes    Birth control/protection: None  Other Topics Concern   Not on file  Social History Narrative   2 children, both nearby   Social Determinants of Health   Financial Resource Strain: Low Risk    Difficulty of Paying Living Expenses: Not hard at all  Food Insecurity: No Food Insecurity   Worried About Charity fundraiser in the Last Year: Never true   Arboriculturist in the Last Year: Never true  Transportation Needs: No Transportation Needs   Lack of Transportation (Medical): No   Lack of Transportation (Non-Medical): No  Physical Activity: Insufficiently Active   Days of Exercise per Week: 2 days   Minutes of Exercise per Session: 20 min  Stress: No Stress Concern Present   Feeling of Stress : Not at all  Social Connections: Moderately Isolated   Frequency of Communication with Friends and Family: More than three times a week   Frequency of Social Gatherings with Friends and Family: More than three times a week   Attends Religious Services: 1 to 4 times per year   Active Member of [employer] or Organizations: No   Attends Archivist Meetings: Never   Marital Status: Never married  Human resources officer Violence: Not At Risk   Fear of Current or Ex-Partner: No   Emotionally Abused: No   Physically Abused: No   Sexually Abused: No    ROS Review of Systems  Constitutional: Negative.   Respiratory: Negative.    Cardiovascular:        Leg swelling resolved  Genitourinary:        Urinating at night that started with lasix BID; he has been taking lasix around 8 AM and 8 PM  Psychiatric/Behavioral: Negative.     Objective:   Today's Vitals: BP 132/76 (BP Location: Left Arm, Patient Position: Sitting, Cuff Size: Normal)   Pulse (!) 102   Temp (!) 97.4 F (36.3 C) (Temporal)   Ht _0  (1.702 m)   Wt 243 lb (110.2 kg)   SpO2 94%   BMI 38.06 kg/m   Physical  Exam Constitutional:      Appearance: Normal appearance. He is obese.  Cardiovascular:     Rate and Rhythm: Normal rate and regular rhythm.     Pulses: Normal pulses.     Heart sounds: Normal heart sounds.  Pulmonary:     Effort: Pulmonary effort is normal.     Breath sounds: Normal breath sounds.  Neurological:     Mental Status: He is alert.  Psychiatric:  Mood and Affect: Mood normal.        Behavior: Behavior normal.        Thought Content: Thought content normal.        Judgment: Judgment normal.    Assessment & Plan:   Problem List Items Addressed This Visit       Cardiovascular and Mediastinum   Essential hypertension, benign    -had recent leg swelling -he stopped amlodipine recently on advice of oncology for the leg swelling -avoid ACEi/ARB per nephrology recommendations       Relevant Medications   furosemide (LASIX) 20 MG tablet   Other Relevant Orders   Lipid Panel With LDL/HDL Ratio     Digestive   Dysphagia    -no current isseus         Genitourinary   Chronic kidney disease, stage V (HCC)     Other   MGUS (monoclonal gammopathy of unknown significance)    -followed by Dr. Delton Coombes -undergoing treatment now       Multiple myeloma ([place])    -has had weight gain since starting steroids -reviewed records from oncology       Hypocalcemia   Nocturia    -takes flomax for BPH -change lasix to 20 mg in the AM and 20 mg at lunch to prevent nocturia       Relevant Orders   PSA   Other Visit Diagnoses     Screening due    -  Primary   Relevant Orders   Hepatitis C antibody   HIV Antibody (routine testing w rflx)   Encounter for general adult medical examination with abnormal findings       Relevant Orders   Lipid Panel With LDL/HDL Ratio   TSH       Outpatient Encounter Medications as of 05/22/2021  Medication Sig   aspirin EC 81 MG tablet Take 1 tablet (81 mg total) by mouth daily.   bortezomib IV (VELCADE) 3.5 MG  injection Inject 1.5 mg/m2 into the vein once a week.   calcitRIOL (ROCALTROL) 0.25 MCG capsule Take 0.25 mcg by mouth every Monday, Wednesday, and Friday.   cholecalciferol (VITAMIN D3) 25 MCG (1000 UNIT) tablet Take 1,000 Units by mouth daily.   CYCLOPHOSPHAMIDE IV Inject 300 mg/m2 into the vein once a week.   dexamethasone 40 mg in sodium chloride 0.9 % 50 mL Inject 40 mg into the vein once a week.   furosemide (LASIX) 20 MG tablet Take 20 mg by mouth daily.   Multiple Vitamin (MULTIVITAMIN) tablet Take by mouth.   Omega-3 Fatty Acids (FISH OIL) 1000 MG CAPS Take 1,000 mg by mouth in the morning, at noon, and at bedtime.    pantoprazole (PROTONIX) 40 MG tablet Take 1 tablet (40 mg total) by mouth 2 (two) times daily.   polyethylene glycol (MIRALAX / GLYCOLAX) packet Take 17 g by mouth daily. Patient states that he takes as needed.   pravastatin (PRAVACHOL) 40 MG tablet Take 40 mg by mouth daily.   sodium bicarbonate 650 MG tablet Take 1,300 mg by mouth 3 (three) times daily.   tamsulosin (FLOMAX) 0.4 MG CAPS capsule Take 0.4 mg by mouth daily.    amLODipine (NORVASC) 5 MG tablet Take 5 mg by mouth daily. (Patient not taking: Reported on 05/22/2021)   [DISCONTINUED] predniSONE (DELTASONE) 10 MG tablet Take 1 tablet (10 mg total) by mouth daily with breakfast. (Patient not taking: Reported on 05/22/2021)   [DISCONTINUED] prochlorperazine (COMPAZINE) 10 MG tablet Take 1  tablet (10 mg total) by mouth every 6 (six) hours as needed for nausea or vomiting. (Patient not taking: Reported on 05/22/2021)   No facility-administered encounter medications on file as of 05/22/2021.    Follow-up: Return in about 1 month (around 06/22/2021) for Physical Exam.   Noreene Larsson, NP

## 2021-05-22 NOTE — Progress Notes (Signed)
Ernest Barnett [address], [name]   CLINIC:  Medical Oncology/Hematology  PCP:  Ernest Larsson, NP [address]  [address] / [place] [address] [phone]   REASON FOR VISIT:  Follow-up for MGUS  PRIOR THERAPY: none  NGS Results: not done  CURRENT THERAPY: CyBorD weekly  BRIEF ONCOLOGIC HISTORY:  Oncology History  Multiple myeloma ([place])  03/06/2021 Initial Diagnosis   Multiple myeloma ([place])    03/06/2021 Cancer Staging   Staging form: Plasma Cell Myeloma and Plasma Cell Disorders, AJCC 8th Edition - Clinical stage from 03/06/2021: RISS Stage II (Beta-2-microglobulin (mg/L): 5.5, Albumin (g/dL): 3.5, ISS: Stage III, High-risk cytogenetics: Absent, LDH: Normal) - Signed by Derek Jack, MD on 03/06/2021  Stage prefix: Initial diagnosis  Beta 2 microglobulin range (mg/L): Greater than or equal to 5.5  Albumin range (g/dL): Greater than or equal to 3.5  Cytogenetics: No abnormalities  Pretreatment monoclonal protein level in serum (M spike) (g/dL): 2  Pretreatment serum free kappa light chain level (g/L): 794  Pretreatment serum free lambda light chain level (g/L): 15.9    03/14/2021 -  Chemotherapy    Patient is on Treatment Plan: MULTIPLE MYELOMA CYBORD - WEEKLY BORTEZOMIB         CANCER STAGING: Cancer Staging Multiple myeloma ([place]) Staging form: Plasma Cell Myeloma and Plasma Cell Disorders, AJCC 8th Edition - Clinical stage from 03/06/2021: RISS Stage II (Beta-2-microglobulin (mg/L): 5.5, Albumin (g/dL): 3.5, ISS: Stage III, High-risk cytogenetics: Absent, LDH: Normal) - Signed by Derek Jack, MD on 03/06/2021  Virtual Visit via Video Note  I connected with Ernest Barnett on '@TODAY' @ at 10:45 AM EDT by a video enabled telemedicine application and verified that I am speaking with the correct person using two identifiers.  Location: Patient: clinic Provider: home  Others present: Ernest Rolls, RN  I  discussed the limitations of evaluation and management by telemedicine and the availability of in person appointments. The patient expressed understanding and agreed to proceed.  INTERVAL HISTORY:  Ernest Barnett, a 65 y.o. male, returns for routine follow-up and consideration for next cycle of chemotherapy. Ernest Barnett was last seen on 03/21/21.  Due for day #15 cycle #3 of CyBorD today.   Overall, he tells me he has been feeling pretty well. He reports swellings in his legs which are more pronounced in the right leg. He also reports cramping in his legs and right hand. He has been taking an antacid to try and treat the cramping. He is taking lasix BID. He denies tingling and numbness in his hands and feet.  Overall, he feels ready for next cycle of chemo today.   REVIEW OF SYSTEMS:  Review of Systems  Respiratory:  Positive for cough and shortness of breath.   Cardiovascular:  Positive for leg swelling.  Musculoskeletal:  Positive for myalgias (Cramps in R hand and legs).   PAST MEDICAL/SURGICAL HISTORY:  Past Medical History:  Diagnosis Date   Chronic kidney disease    COPD (chronic obstructive pulmonary disease) ([place])    CVA (cerebral vascular accident) ([place]) 2013   GERD (gastroesophageal reflux disease)    HOH (hard of hearing)    Hypercholesteremia    Hypertension    Stroke ([place]) 03/24/2012   left sided weakness   Vitamin D deficiency    Past Surgical History:  Procedure Laterality Date   AV FISTULA PLACEMENT Right 02/08/2021   Procedure: RIGHT ARM ARTERIOVENOUS (AV) FISTULA CREATION;  Surgeon: Donnetta Hutching,  Arvilla Meres, MD;  Location: [facility];  Service: Vascular;  Laterality: Right;   BIOPSY  11/12/2018   Procedure: BIOPSY;  Surgeon: Rogene Houston, MD;  Location: [facility];  Service: Endoscopy;;  colon   BIOPSY  05/10/2020   Procedure: BIOPSY;  Surgeon: Rogene Houston, MD;  Location: [facility];  Service: Endoscopy;;  esophagus   CATARACT EXTRACTION     right eye   CATARACT  EXTRACTION [facility]  10/08/2012   Procedure: CATARACT EXTRACTION PHACO AND INTRAOCULAR LENS PLACEMENT ([place]);  Surgeon: Tonny Branch, MD;  Location: [facility];  Service: Ophthalmology;  Laterality: Left;  CDE:6.64   CHOLECYSTECTOMY     [place]   COLONOSCOPY N/A 11/12/2018   Procedure: COLONOSCOPY;  Surgeon: Rogene Houston, MD;  Location: [facility];  Service: Endoscopy;  Laterality: N/A;  1030   ELBOW FRACTURE SURGERY     left   ESOPHAGEAL DILATION N/A 11/12/2018   Procedure: ESOPHAGEAL DILATION;  Surgeon: Rogene Houston, MD;  Location: [facility];  Service: Endoscopy;  Laterality: N/A;   ESOPHAGEAL DILATION N/A 05/10/2020   Procedure: ESOPHAGEAL DILATION;  Surgeon: Rogene Houston, MD;  [facility]: [facility];  Service: Endoscopy;  Laterality: N/A;   ESOPHAGOGASTRODUODENOSCOPY N/A 11/12/2018   Procedure: ESOPHAGOGASTRODUODENOSCOPY (EGD);  Surgeon: Rogene Houston, MD;  [facility];  Service: Endoscopy;  Laterality: N/A;   ESOPHAGOGASTRODUODENOSCOPY N/A 05/10/2020   Procedure: ESOPHAGOGASTRODUODENOSCOPY (EGD);  Surgeon: Rogene Houston, MD;  [facility];  Service: Endoscopy;  Laterality: N/A;  210   HERNIA REPAIR     right inguinal   HYDROCELE EXCISION / REPAIR     POLYPECTOMY  11/12/2018   Procedure: POLYPECTOMY;  Surgeon: Rogene Houston, MD;  [facility];  Service: Endoscopy;;  colon    RETINAL DETACHMENT SURGERY Left 2019   SPLENECTOMY, TOTAL     TONSILLECTOMY     VOCAL CORD INJECTION     removal of polyp-2005    SOCIAL HISTORY:  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  Tobacco Use   Smoking status: Former    Packs/day: 2.00    Years: 40.00    Pack years: 80.00    Types: Cigarettes,  E-cigarettes   Smokeless tobacco: Current   Tobacco comments:    Pt only vap now  Media planner   Vaping Use: Every day   Substances: Nicotine  Substance and Sexual Activity   Alcohol use: Yes    Comment: occassional- 1 beer, but usually less than 1 drink per day   Drug use: No   Sexual activity: Yes    Birth control/protection: None  Other Topics Concern   Not on file  Social History Narrative   2 children, both nearby   Social Determinants of Health   Financial Resource Strain: Low Risk    Difficulty of Paying Living Expenses: Not hard at all  Food Insecurity: No Food Insecurity   Worried About Charity fundraiser in the Last Year: Never true   [place] in the Last Year: Never true  Transportation Needs: No Transportation Needs   Lack of Transportation (Medical): No  Lack of Transportation (Non-Medical): No  Physical Activity: Insufficiently Active   Days of Exercise per Week: 2 days   Minutes of Exercise per Session: 20 min  Stress: No Stress Concern Present   Feeling of Stress : Not at all  Social Connections: Moderately Isolated   Frequency of Communication with Friends and Family: More than three times a week   Frequency of Social Gatherings with Friends and Family: More than three times a week   Attends Religious Services: 1 to 4 times per year   Active Member of [employer] or Organizations: No   Attends Archivist Meetings: Never   Marital Status: Never married  Human resources officer Violence: Not At Risk   Fear of Current or Ex-Partner: No   Emotionally Abused: No   Physically Abused: No   Sexually Abused: No    FAMILY HISTORY:  No family history on file.  CURRENT MEDICATIONS:  Current Outpatient Medications  Medication Sig Dispense Refill   amLODipine (NORVASC) 5 MG tablet Take 5 mg by mouth daily. (Patient not taking: Reported on 05/22/2021)     aspirin EC 81 MG tablet Take 1 tablet (81 mg total) by mouth daily. 30 tablet 11   bortezomib IV  (VELCADE) 3.5 MG injection Inject 1.5 mg/m2 into the vein once a week.     calcitRIOL (ROCALTROL) 0.25 MCG capsule Take 0.25 mcg by mouth every Monday, Wednesday, and Friday.     cholecalciferol (VITAMIN D3) 25 MCG (1000 UNIT) tablet Take 1,000 Units by mouth daily.     CYCLOPHOSPHAMIDE IV Inject 300 mg/m2 into the vein once a week.     dexamethasone 40 mg in sodium chloride 0.9 % 50 mL Inject 40 mg into the vein once a week.     furosemide (LASIX) 20 MG tablet Take 20 mg by mouth daily.     Multiple Vitamin (MULTIVITAMIN) tablet Take by mouth.     Omega-3 Fatty Acids (FISH OIL) 1000 MG CAPS Take 1,000 mg by mouth in the morning, at noon, and at bedtime.      pantoprazole (PROTONIX) 40 MG tablet Take 1 tablet (40 mg total) by mouth 2 (two) times daily. 180 tablet 3   polyethylene glycol (MIRALAX / GLYCOLAX) packet Take 17 g by mouth daily. Patient states that he takes as needed. 30 each 5   pravastatin (PRAVACHOL) 40 MG tablet Take 40 mg by mouth daily.     sodium bicarbonate 650 MG tablet Take 1,300 mg by mouth 3 (three) times daily.     tamsulosin (FLOMAX) 0.4 MG CAPS capsule Take 0.4 mg by mouth daily.      No current facility-administered medications for this visit.    ALLERGIES:  No Known Allergies  Performance status (ECOG): 1 - Symptomatic but completely ambulatory  There were no vitals filed for this visit. Wt Readings from Last 3 Encounters:  05/22/21 243 lb (110.2 kg)  05/16/21 243 lb (110.2 kg)  05/09/21 242 lb 3.2 oz (109.9 kg)   LABORATORY DATA:  I have reviewed the labs as listed.  CBC Latest Ref Rng & Units 05/16/2021 05/09/2021 05/02/2021  WBC 4.0 - 10.5 K/uL 6.7 7.6 7.1  Hemoglobin 13.0 - 17.0 g/dL 10.5(L) 10.4(L) 10.2(L)  Hematocrit 39.0 - 52.0 % 31.3(L) 31.9(L) 31.5(L)  Platelets 150 - 400 K/uL 184 176 156   CMP Latest Ref Rng & Units 05/16/2021 05/09/2021 05/02/2021  Glucose 70 - 99 mg/dL 100(H) 99 100(H)  BUN 8 - 23 mg/dL 35(H) 33(H) 32(H)  Creatinine  0.61 - 1.24  mg/dL 3.61(H) 3.32(H) 3.06(H)  Sodium 135 - 145 mmol/L 136 137 136  Potassium 3.5 - 5.1 mmol/L 4.1 4.1 4.1  Chloride 98 - 111 mmol/L 107 105 107  CO2 22 - 32 mmol/L 21(L) 24 23  Calcium 8.9 - 10.3 mg/dL 8.6(L) 8.7(L) 8.4(L)  Total Protein 6.5 - 8.1 g/dL 7.6 7.6 7.3  Total Bilirubin 0.3 - 1.2 mg/dL 0.8 0.2(L) 0.4  Alkaline Phos 38 - 126 U/L 37(L) 37(L) 33(L)  AST 15 - 41 U/L '16 17 16  ' ALT 0 - 44 U/L '25 22 25    ' DIAGNOSTIC IMAGING:  I have independently reviewed the scans and discussed with the patient. No results found.   ASSESSMENT:  1.  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  Basis for CKD is hypertension associated arteriosclerosis with arterionephrosclerosis. - Bone marrow biopsy on 02/21/2021 with slightly hypercellular bone marrow with trilineage hematopoiesis.  Plasma cells increased in number representing 11% of all cells with kappa light chain restriction. - Myeloma FISH panel with no evidence of T p53.  Plasma cell enrichment yielded limited cellularity, therefore only tested for T p53 probes at. - Chromosome analysis 45, X,- Y (8)/46, XY (12) - Labs on 02/01/2021-M spike 2 g, kappa light chain 794, free light chain ratio 49.97.  Beta-2 microglobulin 5.5, albumin 3.5.  LDH normal. - PET scan on 03/08/2021 with no evidence of active myeloma.  No soft tissue plasmacytoma. - 24-hour urine total protein 2.2 g on 03/13/2021.  Immunofixation positive for IgG kappa. - CyBorD started on 03/14/2021.   2.  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  Smoked 2 packs/day for 32 years and quit smoking cigarettes and vaping now. -Mother had small cell lung cancer.   3.  CKD: -He has stage V CKD thought to be secondary to hypertension. -Renal ultrasound on 12/19/2020 was negative for obstructive uropathy and findings were  consistent with chronic medical renal disease.  Bilateral kidney cysts were seen. -He has 2.7 g of proteinuria on 24-hour urine. -Kidney biopsy was done on 01/25/2021.   PLAN:  1.  Stage II, standard risk IgG kappa multiple myeloma: - He is here for cycle 3-day 15 of CyBorD.  Cycle 3-day 1 was 05/09/2021. - Dexamethasone was cut down to 20 mg weekly secondary to leg swelling and weight gain.  Norvasc was discontinued. - He reports leg swellings, right more than left.  He does not report any pain.  We will get ultrasound Doppler of the right leg. - He also complains of leg cramps at nighttime.  Electrolytes are normal.  I have told him to try some mustard or pickle juice. - I have reviewed his labs today which showed normal white count and platelet count.  Creatinine is 3.6 and calcium 9.1.  LFTs are normal. - Reviewed myeloma panel from 05/16/2021.  M spike improved to 1 g from 2 g prior to treatment.  Kappa light chains improved to 98 from 794.  Ratio improved to 25 from 49. - I have talked to him about bone marrow transplant consultation.  He was initially reluctant.  I will send him to Dr. Aris Lot at [facility] for an opinion regarding auto stem cell transplant. - If he is a candidate for transplant, will likely hold Cytoxan after 4-6 cycles. - RTC 3 weeks for follow-up.  Plan to repeat  myeloma labs in 2 weeks.   2.  Smoking history: - CT chest on 02/15/2021 was lung RADS 1.   3.  ID prophylaxis: - Continue aspirin for thromboprophylaxis. - Acyclovir was delayed due to worsening renal function.   4.  Myeloma bone disease: - His creatinine is 3.66 with a GFR of 18. - He is not a candidate for Zometa.  We will start him on denosumab.  5.  Lower extremity swelling: - He is on Lasix 20 mg twice daily.  Norvasc was discontinued.   Orders placed this encounter:  No orders of the defined types were placed in this encounter.  I provided 30 minutes of non-face-to-face time during this  encounter.  Derek Jack, MD [place] [phone]   I, Thana Ates, am acting as a scribe for Dr. Derek Jack.  I, Derek Jack MD, have reviewed the above documentation for accuracy and completeness, and I agree with the above.

## 2021-05-22 NOTE — Patient Instructions (Signed)
Please have fasting labs drawn 2-3 days prior to your appointment so we can discuss the results during your office visit.  

## 2021-05-22 NOTE — Assessment & Plan Note (Signed)
-  no current isseus

## 2021-05-23 ENCOUNTER — Inpatient Hospital Stay (HOSPITAL_COMMUNITY): Payer: Medicare HMO

## 2021-05-23 ENCOUNTER — Inpatient Hospital Stay (HOSPITAL_COMMUNITY): Payer: Medicare HMO | Admitting: Hematology

## 2021-05-23 ENCOUNTER — Ambulatory Visit (HOSPITAL_COMMUNITY)
Admission: RE | Admit: 2021-05-23 | Discharge: 2021-05-23 | Disposition: A | Discharge status: Home / Self Care | Payer: Medicare HMO | Source: Ambulatory Visit | Attending: Hematology | Admitting: Hematology

## 2021-05-23 VITALS — BP 108/76 | HR 95 | Temp 98.3°F | Resp 16 | Wt 241.4 lb

## 2021-05-23 VITALS — BP 117/75 | HR 87 | Temp 96.8°F | Resp 18

## 2021-05-23 DIAGNOSIS — R252 Cramp and spasm: Secondary | ICD-10-CM | POA: Diagnosis not present

## 2021-05-23 DIAGNOSIS — C9000 Multiple myeloma not having achieved remission: Secondary | ICD-10-CM | POA: Diagnosis not present

## 2021-05-23 DIAGNOSIS — M79661 Pain in right lower leg: Secondary | ICD-10-CM | POA: Diagnosis not present

## 2021-05-23 DIAGNOSIS — M7989 Other specified soft tissue disorders: Secondary | ICD-10-CM

## 2021-05-23 DIAGNOSIS — Z5112 Encounter for antineoplastic immunotherapy: Secondary | ICD-10-CM | POA: Diagnosis not present

## 2021-05-23 DIAGNOSIS — Z5111 Encounter for antineoplastic chemotherapy: Secondary | ICD-10-CM | POA: Diagnosis not present

## 2021-05-23 LAB — CBC WITH DIFFERENTIAL/PLATELET
Abs Immature Granulocytes: 0.12 10*3/uL — ABNORMAL HIGH (ref 0.00–0.07)
Basophils Absolute: 0.1 10*3/uL (ref 0.0–0.1)
Basophils Relative: 1 %
Eosinophils Absolute: 0.3 10*3/uL (ref 0.0–0.5)
Eosinophils Relative: 3 %
HCT: 31.8 % — ABNORMAL LOW (ref 39.0–52.0)
Hemoglobin: 10.7 g/dL — ABNORMAL LOW (ref 13.0–17.0)
Immature Granulocytes: 2 %
Lymphocytes Relative: 32 %
Lymphs Abs: 2.4 10*3/uL (ref 0.7–4.0)
MCH: 33.2 pg (ref 26.0–34.0)
MCHC: 33.6 g/dL (ref 30.0–36.0)
MCV: 98.8 fL (ref 80.0–100.0)
Monocytes Absolute: 1.2 10*3/uL — ABNORMAL HIGH (ref 0.1–1.0)
Monocytes Relative: 16 %
Neutro Abs: 3.6 10*3/uL (ref 1.7–7.7)
Neutrophils Relative %: 46 %
Platelets: 179 10*3/uL (ref 150–400)
RBC: 3.22 MIL/uL — ABNORMAL LOW (ref 4.22–5.81)
RDW: 16.6 % — ABNORMAL HIGH (ref 11.5–15.5)
WBC: 7.6 10*3/uL (ref 4.0–10.5)
nRBC: 3 % — ABNORMAL HIGH (ref 0.0–0.2)

## 2021-05-23 LAB — COMPREHENSIVE METABOLIC PANEL
ALT: 24 U/L (ref 0–44)
AST: 16 U/L (ref 15–41)
Albumin: 3.7 g/dL (ref 3.5–5.0)
Alkaline Phosphatase: 36 U/L — ABNORMAL LOW (ref 38–126)
Anion gap: 8 (ref 5–15)
BUN: 35 mg/dL — ABNORMAL HIGH (ref 8–23)
CO2: 23 mmol/L (ref 22–32)
Calcium: 9.1 mg/dL (ref 8.9–10.3)
Chloride: 103 mmol/L (ref 98–111)
Creatinine, Ser: 3.66 mg/dL — ABNORMAL HIGH (ref 0.61–1.24)
GFR, Estimated: 18 mL/min — ABNORMAL LOW (ref >60)
Glucose, Bld: 91 mg/dL (ref 70–99)
Potassium: 4.3 mmol/L (ref 3.5–5.1)
Sodium: 134 mmol/L — ABNORMAL LOW (ref 135–145)
Total Bilirubin: 0.8 mg/dL (ref 0.3–1.2)
Total Protein: 7.5 g/dL (ref 6.5–8.1)

## 2021-05-23 LAB — MAGNESIUM: Magnesium: 2.4 mg/dL (ref 1.7–2.4)

## 2021-05-23 MED ORDER — PALONOSETRON HCL INJECTION 0.25 MG/5ML
0.2500 mg | Freq: Once | INTRAVENOUS | Status: AC
  Administered 2021-05-23: 0.25 mg via INTRAVENOUS
  Filled 2021-05-23: qty 5

## 2021-05-23 MED ORDER — BORTEZOMIB CHEMO SQ INJECTION 3.5 MG (2.5MG/ML)
1.5000 mg/m2 | Freq: Once | INTRAMUSCULAR | Status: AC
  Administered 2021-05-23: 3.5 mg via SUBCUTANEOUS
  Filled 2021-05-23: qty 1.4

## 2021-05-23 MED ORDER — SODIUM CHLORIDE 0.9 % IV SOLN
300.0000 mg/m2 | Freq: Once | INTRAVENOUS | Status: AC
  Administered 2021-05-23: 680 mg via INTRAVENOUS
  Filled 2021-05-23: qty 34

## 2021-05-23 MED ORDER — SODIUM CHLORIDE 0.9 % IV SOLN
40.0000 mg | Freq: Once | INTRAVENOUS | Status: AC
  Administered 2021-05-23: 40 mg via INTRAVENOUS
  Filled 2021-05-23: qty 4

## 2021-05-23 MED ORDER — SODIUM CHLORIDE 0.9 % IV SOLN: Freq: Once | INTRAVENOUS | Status: AC

## 2021-05-23 NOTE — Progress Notes (Signed)
Patient has been assessed, vital signs and labs have been reviewed by Dr. Katragadda. ANC, Creatinine, LFTs, and Platelets are within treatment parameters per Dr. Katragadda. The patient is good to proceed with treatment at this time. Primary RN and pharmacy aware.  

## 2021-05-23 NOTE — Patient Instructions (Signed)
[place]  Discharge Instructions: Thank you for choosing [facility] to provide your oncology and hematology care.  If you have a lab appointment with the [facility], please come in thru the Main Entrance and check in at the main information desk.  Wear comfortable clothing and clothing appropriate for easy access to any Portacath or PICC line.   We strive to give you quality time with your provider. You may need to reschedule your appointment if you arrive late (15 or more minutes).  Arriving late affects you and other patients whose appointments are after yours.  Also, if you miss three or more appointments without notifying the office, you may be dismissed from the clinic at the provider's discretion.      For prescription refill requests, have your pharmacy contact our office and allow 72 hours for refills to be completed.    Today you received the following chemotherapy and/or immunotherapy agents Velcade/Cytoxan.       To help prevent nausea and vomiting after your treatment, we encourage you to take your nausea medication as directed.  BELOW ARE SYMPTOMS THAT SHOULD BE REPORTED IMMEDIATELY: *FEVER GREATER THAN 100.4 F (38 C) OR HIGHER *CHILLS OR SWEATING *NAUSEA AND VOMITING THAT IS NOT CONTROLLED WITH YOUR NAUSEA MEDICATION *UNUSUAL SHORTNESS OF BREATH *UNUSUAL BRUISING OR BLEEDING *URINARY PROBLEMS (pain or burning when urinating, or frequent urination) *BOWEL PROBLEMS (unusual diarrhea, constipation, pain near the anus) TENDERNESS IN MOUTH AND THROAT WITH OR WITHOUT PRESENCE OF ULCERS (sore throat, sores in mouth, or a toothache) UNUSUAL RASH, SWELLING OR PAIN  UNUSUAL VAGINAL DISCHARGE OR ITCHING   Items with * indicate a potential emergency and should be followed up as soon as possible or go to the Emergency Department if any problems should occur.  Please show the CHEMOTHERAPY ALERT CARD or IMMUNOTHERAPY ALERT CARD at check-in to the  Emergency Department and triage nurse.  Should you have questions after your visit or need to cancel or reschedule your appointment, please contact [facility] [phone]  and follow the prompts.  Office hours are 8:00 a.m. to 4:30 p.m. Monday - Friday. Please note that voicemails left after 4:00 p.m. may not be returned until the following business day.  We are closed weekends and major holidays. You have access to a nurse at all times for urgent questions. Please call the main number to the clinic [phone] and follow the prompts.  For any non-urgent questions, you may also contact your provider using MyChart. We now offer e-Visits for anyone 64 and older to request care online for non-urgent symptoms. For details visit [email or web address].   Also download the MyChart app! Go to the app store, search "MyChart", open the app, select [place], and log in with your MyChart username and password.  Due to Covid, a mask is required upon entering the hospital/clinic. If you do not have a mask, one will be given to you upon arrival. For doctor visits, patients may have 1 support person aged 64 or older with them. For treatment visits, patients cannot have anyone with them due to current Covid guidelines and our immunocompromised population.

## 2021-05-23 NOTE — Progress Notes (Signed)
      Message received from [facility] / Dr. Delton Coombes to proceed with treatment. Labs reviewd and MD aware of patient's 3.66 Creatinine level. Patient to receive Delton See today if authorized. U/S today of the right leg due to swelling and cramping per secure chat.   Treatment given today per MD orders. Tolerated infusion without adverse affects. Vital signs stable. No complaints at this time. Discharged from clinic ambulatory in stable condition. Alert and oriented x 3. F/U with [facility] as scheduled.

## 2021-05-23 NOTE — Patient Instructions (Addendum)
[facility] at [facility] Discharge Instructions  You were seen today by Dr. Katragadda. He went over your recent results, and you received your treatment. Dr. Katragadda will see you back in 3 weeks for labs and follow up.   Thank you for choosing [facility] at [facility] to provide your oncology and hematology care.  To afford each patient quality time with our provider, please arrive at least 15 minutes before your scheduled appointment time.   If you have a lab appointment with the Cancer Center please come in thru the Main Entrance and check in at the main information desk  You need to re-schedule your appointment should you arrive 10 or more minutes late.  We strive to give you quality time with our providers, and arriving late affects you and other patients whose appointments are after yours.  Also, if you no show three or more times for appointments you may be dismissed from the clinic at the providers discretion.     Again, thank you for choosing [facility].  Our hope is that these requests will decrease the amount of time that you wait before being seen by our physicians.       _____________________________________________________________  Should you have questions after your visit to [facility], please contact our office at [phone] between the hours of 8:00 a.m. and 4:30 p.m.  Voicemails left after 4:00 p.m. will not be returned until the following business day.  For prescription refill requests, have your pharmacy contact our office and allow 72 hours.    Cancer Center Support Programs:   > Cancer Support Group  2nd Tuesday of the month 1pm-2pm, Journey Room   

## 2021-05-24 ENCOUNTER — Encounter (HOSPITAL_COMMUNITY): Payer: Self-pay | Admitting: Lab

## 2021-05-24 NOTE — Progress Notes (Unsigned)
Referral sent to [facility] Dr Aris Lot .  Records faxed on 7/21

## 2021-05-29 ENCOUNTER — Ambulatory Visit (INDEPENDENT_AMBULATORY_CARE_PROVIDER_SITE_OTHER): Payer: Medicare HMO | Admitting: *Deleted

## 2021-05-29 ENCOUNTER — Other Ambulatory Visit: Payer: Self-pay

## 2021-05-29 DIAGNOSIS — Z0000 Encounter for general adult medical examination without abnormal findings: Secondary | ICD-10-CM | POA: Diagnosis not present

## 2021-05-29 DIAGNOSIS — Z1159 Encounter for screening for other viral diseases: Secondary | ICD-10-CM

## 2021-05-29 NOTE — Patient Instructions (Signed)
Ernest Barnett , Thank you for taking time to come for your Medicare Wellness Visit. I appreciate your ongoing commitment to your health goals. Please review the following plan we discussed and let me know if I can assist you in the future.   Screening recommendations/referrals: Colonoscopy: Completed Due 11-13-2023 Recommended yearly ophthalmology/optometry visit for glaucoma screening and checkup Recommended yearly dental visit for hygiene and checkup  Vaccinations: Influenza vaccine: Will discuss with provider at upcoming appointment Pneumococcal vaccine: Completed at prior providers office Tdap vaccine: Completed at prior providers office Shingles vaccine: Patient declined, advised where to get if he chooses to have done     Advanced directives: Information provided   Conditions/risks identified: Hypertension  Next appointment: 1 year   Preventive Care 40-64 Years, Male Preventive care refers to lifestyle choices and visits with your health care provider that can promote health and wellness. What does preventive care include? A yearly physical exam. This is also called an annual well check. Dental exams once or twice a year. Routine eye exams. Ask your health care provider how often you should have your eyes checked. Personal lifestyle choices, including: Daily care of your teeth and gums. Regular physical activity. Eating a healthy diet. Avoiding tobacco and drug use. Limiting alcohol use. Practicing safe sex. Taking low-dose aspirin every day starting at age 3. What happens during an annual well check? The services and screenings done by your health care provider during your annual well check will depend on your age, overall health, lifestyle risk factors, and family history of disease. Counseling  Your health care provider may ask you questions about your: Alcohol use. Tobacco use. Drug use. Emotional well-being. Home and relationship well-being. Sexual activity. Eating  habits. Work and work Statistician. Screening  You may have the following tests or measurements: Height, weight, and BMI. Blood pressure. Lipid and cholesterol levels. These may be checked every 5 years, or more frequently if you are over 29 years old. Skin check. Lung cancer screening. You may have this screening every year starting at age 23 if you have a 30-pack-year history of smoking and currently smoke or have quit within the past 15 years. Fecal occult blood test (FOBT) of the stool. You may have this test every year starting at age 84. Flexible sigmoidoscopy or colonoscopy. You may have a sigmoidoscopy every 5 years or a colonoscopy every 10 years starting at age 8. Prostate cancer screening. Recommendations will vary depending on your family history and other risks. Hepatitis C blood test. Hepatitis B blood test. Sexually transmitted disease (STD) testing. Diabetes screening. This is done by checking your blood sugar (glucose) after you have not eaten for a while (fasting). You may have this done every 1-3 years. Discuss your test results, treatment options, and if necessary, the need for more tests with your health care provider. Vaccines  Your health care provider may recommend certain vaccines, such as: Influenza vaccine. This is recommended every year. Tetanus, diphtheria, and acellular pertussis (Tdap, Td) vaccine. You may need a Td booster every 10 years. Zoster vaccine. You may need this after age 10. Pneumococcal 13-valent conjugate (PCV13) vaccine. You may need this if you have certain conditions and have not been vaccinated. Pneumococcal polysaccharide (PPSV23) vaccine. You may need one or two doses if you smoke cigarettes or if you have certain conditions. Talk to your health care provider about which screenings and vaccines you need and how often you need them. This information is not intended to replace advice given to  you by your health care provider. Make sure you  discuss any questions you have with your health care provider. Document Released: 11/17/2015 Document Revised: 07/10/2016 Document Reviewed: 08/22/2015 Elsevier Interactive Patient Education  2017 [place] Prevention in the Home Falls can cause injuries. They can happen to people of all ages. There are many things you can do to make your home safe and to help prevent falls. What can I do on the outside of my home? Regularly fix the edges of walkways and driveways and fix any cracks. Remove anything that might make you trip as you walk through a door, such as a raised step or threshold. Trim any bushes or trees on the path to your home. Use bright outdoor lighting. Clear any walking paths of anything that might make someone trip, such as rocks or tools. Regularly check to see if handrails are loose or broken. Make sure that both sides of any steps have handrails. Any raised decks and porches should have guardrails on the edges. Have any leaves, snow, or ice cleared regularly. Use sand or salt on walking paths during winter. Clean up any spills in your garage right away. This includes oil or grease spills. What can I do in the bathroom? Use night lights. Install grab bars by the toilet and in the tub and shower. Do not use towel bars as grab bars. Use non-skid mats or decals in the tub or shower. If you need to sit down in the shower, use a plastic, non-slip stool. Keep the floor dry. Clean up any water that spills on the floor as soon as it happens. Remove soap buildup in the tub or shower regularly. Attach bath mats securely with double-sided non-slip rug tape. Do not have throw rugs and other things on the floor that can make you trip. What can I do in the bedroom? Use night lights. Make sure that you have a light by your bed that is easy to reach. Do not use any sheets or blankets that are too big for your bed. They should not hang down onto the floor. Have a firm chair  that has side arms. You can use this for support while you get dressed. Do not have throw rugs and other things on the floor that can make you trip. What can I do in the kitchen? Clean up any spills right away. Avoid walking on wet floors. Keep items that you use a lot in easy-to-reach places. If you need to reach something above you, use a strong step stool that has a grab bar. Keep electrical cords out of the way. Do not use floor polish or wax that makes floors slippery. If you must use wax, use non-skid floor wax. Do not have throw rugs and other things on the floor that can make you trip. What can I do with my stairs? Do not leave any items on the stairs. Make sure that there are handrails on both sides of the stairs and use them. Fix handrails that are broken or loose. Make sure that handrails are as long as the stairways. Check any carpeting to make sure that it is firmly attached to the stairs. Fix any carpet that is loose or worn. Avoid having throw rugs at the top or bottom of the stairs. If you do have throw rugs, attach them to the floor with carpet tape. Make sure that you have a light switch at the top of the stairs and the bottom of the stairs.  If you do not have them, ask someone to add them for you. What else can I do to help prevent falls? Wear shoes that: Do not have high heels. Have rubber bottoms. Are comfortable and fit you well. Are closed at the toe. Do not wear sandals. If you use a stepladder: Make sure that it is fully opened. Do not climb a closed stepladder. Make sure that both sides of the stepladder are locked into place. Ask someone to hold it for you, if possible. Clearly mark and make sure that you can see: Any grab bars or handrails. First and last steps. Where the edge of each step is. Use tools that help you move around (mobility aids) if they are needed. These include: Canes. Walkers. Scooters. Crutches. Turn on the lights when you go into a  dark area. Replace any light bulbs as soon as they burn out. Set up your furniture so you have a clear path. Avoid moving your furniture around. If any of your floors are uneven, fix them. If there are any pets around you, be aware of where they are. Review your medicines with your doctor. Some medicines can make you feel dizzy. This can increase your chance of falling. Ask your doctor what other things that you can do to help prevent falls. This information is not intended to replace advice given to you by your health care provider. Make sure you discuss any questions you have with your health care provider. Document Released: 08/17/2009 Document Revised: 03/28/2016 Document Reviewed: 11/25/2014 Elsevier Interactive Patient Education  2017 [employer].

## 2021-05-29 NOTE — Progress Notes (Signed)
Subjective:   Ernest Barnett is a 65 y.o. male who presents for Medicare Annual/Subsequent preventive examination.  Review of Systems           Objective:    There were no vitals filed for this visit. There is no height or weight on file to calculate BMI.  Advanced Directives 05/23/2021 05/09/2021 05/02/2021 04/18/2021 04/11/2021 04/04/2021 03/28/2021  Does Patient Have a Medical Advance Directive? No No No No No No No  Would patient like information on creating a medical advance directive? No - Patient declined No - Patient declined No - Patient declined No - Patient declined No - Patient declined No - Patient declined No - Patient declined  Pre-existing out of facility DNR order (yellow form or pink MOST form) - - - - - - -    Current Medications (verified) Outpatient Encounter Medications as of 05/29/2021  Medication Sig   amLODipine (NORVASC) 5 MG tablet Take 5 mg by mouth daily.   aspirin EC 81 MG tablet Take 1 tablet (81 mg total) by mouth daily.   bortezomib IV (VELCADE) 3.5 MG injection Inject 1.5 mg/m2 into the vein once a week.   calcitRIOL (ROCALTROL) 0.25 MCG capsule Take 0.25 mcg by mouth every Monday, Wednesday, and Friday.   Calcium Carbonate-Vit D-Min (CALCIUM 1200 PO) Take 1-2 tablets by mouth daily.   cholecalciferol (VITAMIN D3) 25 MCG (1000 UNIT) tablet Take 1,000 Units by mouth daily.   CYCLOPHOSPHAMIDE IV Inject 300 mg/m2 into the vein once a week.   dexamethasone 40 mg in sodium chloride 0.9 % 50 mL Inject 40 mg into the vein once a week.   furosemide (LASIX) 20 MG tablet Take 20 mg by mouth daily.   Multiple Vitamin (MULTIVITAMIN) tablet Take by mouth.   Omega-3 Fatty Acids (FISH OIL) 1000 MG CAPS Take 1,000 mg by mouth in the morning, at noon, and at bedtime.    pantoprazole (PROTONIX) 40 MG tablet Take 1 tablet (40 mg total) by mouth 2 (two) times daily.   polyethylene glycol (MIRALAX / GLYCOLAX) packet Take 17 g by mouth daily. Patient states that he takes as  needed.   pravastatin (PRAVACHOL) 40 MG tablet Take 40 mg by mouth daily.   sodium bicarbonate 650 MG tablet Take 1,300 mg by mouth 3 (three) times daily.   tamsulosin (FLOMAX) 0.4 MG CAPS capsule Take 0.4 mg by mouth daily.    No facility-administered encounter medications on file as of 05/29/2021.    Allergies (verified) Patient has no known allergies.   History: Past Medical History:  Diagnosis Date   Chronic kidney disease    COPD (chronic obstructive pulmonary disease) ([place])    CVA (cerebral vascular accident) ([place]) 2013   GERD (gastroesophageal reflux disease)    HOH (hard of hearing)    Hypercholesteremia    Hypertension    Stroke ([place]) 03/24/2012   left sided weakness   Vitamin D deficiency    Past Surgical History:  Procedure Laterality Date   AV FISTULA PLACEMENT Right 02/08/2021   Procedure: RIGHT ARM ARTERIOVENOUS (AV) FISTULA CREATION;  Surgeon: [name], MD;  Location: [facility];  Service: Vascular;  Laterality: Right;   BIOPSY  11/12/2018   Procedure: BIOPSY;  Surgeon: Rogene Houston, MD;  Location[facility];  Service: Endoscopy;;  colon   BIOPSY  05/10/2020   Procedure: BIOPSY;  Surgeon: Rogene Houston, MD;  [facility];  Service: Endoscopy;;  esophagus   CATARACT EXTRACTION  right eye   CATARACT EXTRACTION W/PHACO  10/08/2012   Procedure: CATARACT EXTRACTION PHACO AND INTRAOCULAR LENS PLACEMENT (IOC);  Surgeon: Tonny Branch, MD;  Location: [facility];  Service: Ophthalmology;  Laterality: Left;  CDE:6.64   CHOLECYSTECTOMY     [place]   COLONOSCOPY N/A 11/12/2018   Procedure: COLONOSCOPY;  Surgeon: Rogene Houston, MD;  [facility];  Service: Endoscopy;  Laterality: N/A;  1030   ELBOW FRACTURE SURGERY     left   ESOPHAGEAL DILATION N/A 11/12/2018   Procedure: ESOPHAGEAL DILATION;  Surgeon: Rogene Houston, MD;  [facility];  Service: Endoscopy;  Laterality: N/A;   ESOPHAGEAL DILATION N/A 05/10/2020   Procedure: ESOPHAGEAL  DILATION;  Surgeon: Rogene Houston, MD;  [facility];  Service: Endoscopy;  Laterality: N/A;   ESOPHAGOGASTRODUODENOSCOPY N/A 11/12/2018   Procedure: ESOPHAGOGASTRODUODENOSCOPY (EGD);  Surgeon: Rogene Houston, MD;  [facility];  Service: Endoscopy;  Laterality: N/A;   ESOPHAGOGASTRODUODENOSCOPY N/A 05/10/2020   Procedure: ESOPHAGOGASTRODUODENOSCOPY (EGD);  Surgeon: Rogene Houston, MD;  [facility];  Service: Endoscopy;  Laterality: N/A;  210   HERNIA REPAIR     right inguinal   HYDROCELE EXCISION / REPAIR     POLYPECTOMY  11/12/2018   Procedure: POLYPECTOMY;  Surgeon: Rogene Houston, MD;  [facility];  Service: Endoscopy;;  colon    RETINAL DETACHMENT SURGERY Left 2019   SPLENECTOMY, TOTAL     TONSILLECTOMY     VOCAL CORD INJECTION     removal of polyp-2005   No family history on file. 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  Tobacco Use   Smoking status: Former    Packs/day: 2.00    Years: 40.00    Pack years: 80.00    Types: Cigarettes, E-cigarettes   Smokeless tobacco: Current   Tobacco comments:    Pt only vap now  Vaping Use   Vaping Use: Every day   Substances: Nicotine  Substance and Sexual Activity   Alcohol use: Yes    Comment: occassional- 1 beer, but usually less than 1 drink per day   Drug use: No   Sexual activity: Yes    Birth control/protection: None  Other Topics Concern   Not on file  Social History Narrative   2 children, both nearby   Social Determinants of Health   Financial Resource Strain: Low Risk    Difficulty of Paying Living Expenses: Not hard at all  Food Insecurity: No Food Insecurity   Worried About Charity fundraiser in the  Last Year: Never true   Arboriculturist in the Last Year: Never true  Transportation Needs: No Transportation Needs   Lack of Transportation (Medical): No   Lack of Transportation (Non-Medical): No  Physical Activity: Insufficiently Active   Days of Exercise per Week: 2 days   Minutes of Exercise per Session: 20 min  Stress: No Stress Concern Present   Feeling of Stress : Not at all  Social Connections: Moderately Isolated   Frequency of Communication with Friends and Family: More than three times a week   Frequency of Social Gatherings with  Friends and Family: More than three times a week   Attends Religious Services: 1 to 4 times per year   Active Member of [employer] or Organizations: No   Attends Archivist Meetings: Never   Marital Status: Never married    Tobacco Counseling Ready to quit: Not Answered Counseling given: Not Answered Tobacco comments: Pt only vap now   Clinical Intake:                 Diabetic?no         Activities of Daily Living In your present state of health, do you have any difficulty performing the following activities: 02/05/2021  Hearing? Y  Comment bilateral  Vision? N  Difficulty concentrating or making decisions? N  Walking or climbing stairs? (No Data)  Comment left sided weakness  Dressing or bathing? N  Doing errands, shopping? N  Some recent data might be hidden    Patient Care Team: Lindell Spar, MD as PCP - General (Internal Medicine) Brien Mates, RN as Oncology Nurse Navigator (Oncology)  Indicate any recent Medical Services you may have received from other than [facility] providers in the past year (date may be approximate).     Assessment:   This is a routine wellness examination for [place].  Hearing/Vision screen No results found.  Dietary issues and exercise activities discussed:     Goals Addressed   None   Depression Screen PHQ 2/9 Scores 05/22/2021 02/01/2021  PHQ - 2 Score 0 0    Fall  Risk Fall Risk  05/22/2021  Falls in the past year? 0  Number falls in past yr: 0  Injury with Fall? 0  Risk for fall due to : No Fall Risks  Follow up Falls evaluation completed    [place]:  Any stairs in or around the home? No  If so, are there any without handrails? No  Home free of loose throw rugs in walkways, pet beds, electrical cords, etc? Yes  Adequate lighting in your home to reduce risk of falls? Yes   ASSISTIVE DEVICES UTILIZED TO PREVENT FALLS:  Life alert? No  Use of a cane, walker or w/c? No  Grab bars in the bathroom? No  Shower chair or bench in shower? No  Elevated toilet seat or a handicapped toilet? No   TIMED UP AND GO:  Was the test performed? No .  Length of time to ambulate 10 feet: NA sec.     Cognitive Function:        Immunizations  There is no immunization history on file for this patient.  TDAP status: Up to date patient states it has not been but 4 years since last   Flu Vaccine status: Declined, Education has been provided regarding the importance of this vaccine but patient still declined. Advised may receive this vaccine at local pharmacy or Health Dept. Aware to provide a copy of the vaccination record if obtained from local pharmacy or Health Dept. Verbalized acceptance and understanding.  Pneumococcal vaccine status: Up to date completed at last dr before coming to our office   Covid-19 vaccine status: Declined, Education has been provided regarding the importance of this vaccine but patient still declined. Advised may receive this vaccine at local pharmacy or Health Dept.or vaccine clinic. Aware to provide a copy of the vaccination record if obtained from local pharmacy or Health Dept. Verbalized acceptance and understanding.  Qualifies for Shingles Vaccine? Yes   Zostavax completed  No   Shingrix Completed?: No.    Education has been provided regarding the importance of this vaccine. Patient has  been advised to call insurance company to determine out of pocket expense if they have not yet received this vaccine. Advised may also receive vaccine at local pharmacy or Health Dept. Verbalized acceptance and understanding.  Screening Tests Health Maintenance  Topic Date Due   Pneumococcal Vaccine 28-61 Years old (1 - PCV) Never done   HIV Screening  Never done   Hepatitis C Screening  Never done   Zoster Vaccines- Shingrix (1 of 2) Never done   COVID-19 Vaccine (1) 06/07/2021 (Originally 01/13/1961)   TETANUS/TDAP  05/22/2022 (Originally 01/14/1975)   INFLUENZA VACCINE  06/04/2021   HPV VACCINES  Aged Out   COLONOSCOPY (Pts 45-41yrs Insurance coverage will need to be confirmed)  Discontinued    Health Maintenance  Health Maintenance Due  Topic Date Due   Pneumococcal Vaccine 62-3 Years old (1 - PCV) Never done   HIV Screening  Never done   Hepatitis C Screening  Never done   Zoster Vaccines- Shingrix (1 of 2) Never done    Colorectal cancer screening: Type of screening: Colonoscopy. Completed 11-12-2018. Repeat every 11-13-2023 years  Lung Cancer Screening: (Low Dose CT Chest recommended if Age 53-80 years, 30 pack-year currently smoking OR have quit w/in 15years.) does qualify.   Lung Cancer Screening Referral: No Completed 02-18-21  Additional Screening:  Hepatitis C Screening: does qualify; order placed   Vision Screening: Recommended annual ophthalmology exams for early detection of glaucoma and other disorders of the eye. Is the patient up to date with their annual eye exam?  No Who is the provider or what is the name of the office in which the patient attends annual eye exams? [facility]  If pt is not established with a provider, would they like to be referred to a provider to establish care? No .   Dental Screening: Recommended annual dental exams for proper oral hygiene  Community Resource Referral / Chronic Care Management: CRR required this visit?  No   CCM  required this visit?  No      Plan:     I have personally reviewed and noted the following in the patient's chart:   Medical and social history Use of alcohol, tobacco or illicit drugs  Current medications and supplements including opioid prescriptions. Patient is not currently taking opioid prescriptions. Functional ability and status Nutritional status Physical activity Advanced directives List of other physicians Hospitalizations, surgeries, and ER visits in previous 12 months Vitals Screenings to include cognitive, depression, and falls Referrals and appointments  In addition, I have reviewed and discussed with patient certain preventive protocols, quality metrics, and best practice recommendations. A written personalized care plan for preventive services as well as general preventive health recommendations were provided to patient.     Shelda Altes, [place]   05/29/2021   Nurse Notes: Telehealth Visit. Spent 40 mins talking to patient

## 2021-05-30 ENCOUNTER — Inpatient Hospital Stay (HOSPITAL_COMMUNITY): Payer: Medicare HMO

## 2021-05-30 ENCOUNTER — Encounter (HOSPITAL_COMMUNITY): Payer: Self-pay

## 2021-05-30 ENCOUNTER — Other Ambulatory Visit: Payer: Self-pay

## 2021-05-30 VITALS — BP 117/64 | HR 94 | Temp 97.0°F | Resp 18 | Wt 244.6 lb

## 2021-05-30 DIAGNOSIS — Z5112 Encounter for antineoplastic immunotherapy: Secondary | ICD-10-CM | POA: Diagnosis not present

## 2021-05-30 DIAGNOSIS — Z5111 Encounter for antineoplastic chemotherapy: Secondary | ICD-10-CM | POA: Diagnosis not present

## 2021-05-30 DIAGNOSIS — C9000 Multiple myeloma not having achieved remission: Secondary | ICD-10-CM | POA: Diagnosis not present

## 2021-05-30 LAB — COMPREHENSIVE METABOLIC PANEL
ALT: 22 U/L (ref 0–44)
AST: 15 U/L (ref 15–41)
Albumin: 3.4 g/dL — ABNORMAL LOW (ref 3.5–5.0)
Alkaline Phosphatase: 35 U/L — ABNORMAL LOW (ref 38–126)
Anion gap: 4 — ABNORMAL LOW (ref 5–15)
BUN: 31 mg/dL — ABNORMAL HIGH (ref 8–23)
CO2: 22 mmol/L (ref 22–32)
Calcium: 8.7 mg/dL — ABNORMAL LOW (ref 8.9–10.3)
Chloride: 108 mmol/L (ref 98–111)
Creatinine, Ser: 3.03 mg/dL — ABNORMAL HIGH (ref 0.61–1.24)
GFR, Estimated: 22 mL/min — ABNORMAL LOW (ref >60)
Glucose, Bld: 132 mg/dL — ABNORMAL HIGH (ref 70–99)
Potassium: 3.9 mmol/L (ref 3.5–5.1)
Sodium: 134 mmol/L — ABNORMAL LOW (ref 135–145)
Total Bilirubin: 0.6 mg/dL (ref 0.3–1.2)
Total Protein: 7 g/dL (ref 6.5–8.1)

## 2021-05-30 LAB — CBC WITH DIFFERENTIAL/PLATELET
Abs Immature Granulocytes: 0.07 10*3/uL (ref 0.00–0.07)
Basophils Absolute: 0.1 10*3/uL (ref 0.0–0.1)
Basophils Relative: 1 %
Eosinophils Absolute: 0.3 10*3/uL (ref 0.0–0.5)
Eosinophils Relative: 5 %
HCT: 29.8 % — ABNORMAL LOW (ref 39.0–52.0)
Hemoglobin: 10.1 g/dL — ABNORMAL LOW (ref 13.0–17.0)
Immature Granulocytes: 1 %
Lymphocytes Relative: 34 %
Lymphs Abs: 2.3 10*3/uL (ref 0.7–4.0)
MCH: 34 pg (ref 26.0–34.0)
MCHC: 33.9 g/dL (ref 30.0–36.0)
MCV: 100.3 fL — ABNORMAL HIGH (ref 80.0–100.0)
Monocytes Absolute: 0.8 10*3/uL (ref 0.1–1.0)
Monocytes Relative: 12 %
Neutro Abs: 3.3 10*3/uL (ref 1.7–7.7)
Neutrophils Relative %: 47 %
Platelets: 162 10*3/uL (ref 150–400)
RBC: 2.97 MIL/uL — ABNORMAL LOW (ref 4.22–5.81)
RDW: 16.6 % — ABNORMAL HIGH (ref 11.5–15.5)
Smear Review: NORMAL
WBC: 6.9 10*3/uL (ref 4.0–10.5)
nRBC: 3 % — ABNORMAL HIGH (ref 0.0–0.2)

## 2021-05-30 MED ORDER — DENOSUMAB 120 MG/1.7ML ~~LOC~~ SOLN
120.0000 mg | Freq: Once | SUBCUTANEOUS | Status: AC
  Administered 2021-05-30: 120 mg via SUBCUTANEOUS
  Filled 2021-05-30: qty 1.7

## 2021-05-30 NOTE — Progress Notes (Signed)
Xgeva injedtion given per orders. Patient tolerated it well without problems. Vitals stable and discharged home from clinic ambulatory. Follow up as scheduled.

## 2021-05-30 NOTE — Patient Instructions (Signed)
[facility]  Discharge Instructions: Thank you for choosing [facility] to provide your oncology and hematology care.  If you have a lab appointment with the Cancer Center, please come in thru the Main Entrance and check in at the main information desk.  Wear comfortable clothing and clothing appropriate for easy access to any Portacath or PICC line.   We strive to give you quality time with your provider. You may need to reschedule your appointment if you arrive late (15 or more minutes).  Arriving late affects you and other patients whose appointments are after yours.  Also, if you miss three or more appointments without notifying the office, you may be dismissed from the clinic at the provider's discretion.      For prescription refill requests, have your pharmacy contact our office and allow 72 hours for refills to be completed.        To help prevent nausea and vomiting after your treatment, we encourage you to take your nausea medication as directed.  BELOW ARE SYMPTOMS THAT SHOULD BE REPORTED IMMEDIATELY: *FEVER GREATER THAN 100.4 F (38 C) OR HIGHER *CHILLS OR SWEATING *NAUSEA AND VOMITING THAT IS NOT CONTROLLED WITH YOUR NAUSEA MEDICATION *UNUSUAL SHORTNESS OF BREATH *UNUSUAL BRUISING OR BLEEDING *URINARY PROBLEMS (pain or burning when urinating, or frequent urination) *BOWEL PROBLEMS (unusual diarrhea, constipation, pain near the anus) TENDERNESS IN MOUTH AND THROAT WITH OR WITHOUT PRESENCE OF ULCERS (sore throat, sores in mouth, or a toothache) UNUSUAL RASH, SWELLING OR PAIN  UNUSUAL VAGINAL DISCHARGE OR ITCHING   Items with * indicate a potential emergency and should be followed up as soon as possible or go to the Emergency Department if any problems should occur.  Please show the CHEMOTHERAPY ALERT CARD or IMMUNOTHERAPY ALERT CARD at check-in to the Emergency Department and triage nurse.  Should you have questions after your visit or need to cancel  or reschedule your appointment, please contact [facility] [phone]  and follow the prompts.  Office hours are 8:00 a.m. to 4:30 p.m. Monday - Friday. Please note that voicemails left after 4:00 p.m. may not be returned until the following business day.  We are closed weekends and major holidays. You have access to a nurse at all times for urgent questions. Please call the main number to the clinic [phone] and follow the prompts.  For any non-urgent questions, you may also contact your provider using MyChart. We now offer e-Visits for anyone 18 and older to request care online for non-urgent symptoms. For details visit [email or web address].   Also download the MyChart app! Go to the app store, search "MyChart", open the app, select , and log in with your MyChart username and password.  Due to Covid, a mask is required upon entering the hospital/clinic. If you do not have a mask, one will be given to you upon arrival. For doctor visits, patients may have 1 support person aged 18 or older with them. For treatment visits, patients cannot have anyone with them due to current Covid guidelines and our immunocompromised population.  

## 2021-05-31 DIAGNOSIS — C9000 Multiple myeloma not having achieved remission: Secondary | ICD-10-CM | POA: Diagnosis not present

## 2021-06-01 ENCOUNTER — Other Ambulatory Visit: Payer: Self-pay

## 2021-06-01 ENCOUNTER — Inpatient Hospital Stay (HOSPITAL_COMMUNITY): Payer: Medicare HMO

## 2021-06-01 VITALS — BP 120/76 | HR 83 | Temp 97.7°F | Resp 18 | Wt 242.0 lb

## 2021-06-01 DIAGNOSIS — Z5112 Encounter for antineoplastic immunotherapy: Secondary | ICD-10-CM | POA: Diagnosis not present

## 2021-06-01 DIAGNOSIS — Z5111 Encounter for antineoplastic chemotherapy: Secondary | ICD-10-CM | POA: Diagnosis not present

## 2021-06-01 DIAGNOSIS — C9000 Multiple myeloma not having achieved remission: Secondary | ICD-10-CM | POA: Diagnosis not present

## 2021-06-01 MED ORDER — PALONOSETRON HCL INJECTION 0.25 MG/5ML
0.2500 mg | Freq: Once | INTRAVENOUS | Status: AC
  Administered 2021-06-01: 0.25 mg via INTRAVENOUS
  Filled 2021-06-01: qty 5

## 2021-06-01 MED ORDER — SODIUM CHLORIDE 0.9 % IV SOLN
300.0000 mg/m2 | Freq: Once | INTRAVENOUS | Status: AC
  Administered 2021-06-01: 680 mg via INTRAVENOUS
  Filled 2021-06-01: qty 34

## 2021-06-01 MED ORDER — SODIUM CHLORIDE 0.9 % IV SOLN
40.0000 mg | Freq: Once | INTRAVENOUS | Status: AC
  Administered 2021-06-01: 40 mg via INTRAVENOUS
  Filled 2021-06-01: qty 4

## 2021-06-01 MED ORDER — BORTEZOMIB CHEMO SQ INJECTION 3.5 MG (2.5MG/ML)
1.5000 mg/m2 | Freq: Once | INTRAMUSCULAR | Status: AC
  Administered 2021-06-01: 3.5 mg via SUBCUTANEOUS
  Filled 2021-06-01: qty 1.4

## 2021-06-01 MED ORDER — SODIUM CHLORIDE 0.9 % IV SOLN: Freq: Once | INTRAVENOUS | Status: AC

## 2021-06-01 NOTE — Progress Notes (Signed)
Labs parameters met for treatment today. Okay for treatment per Dr.Katragadda.   No new complaints at this time.

## 2021-06-01 NOTE — Patient Instructions (Signed)
[facility]  Discharge Instructions: Thank you for choosing [facility] to provide your oncology and hematology care.  If you have a lab appointment with the Cancer Center, please come in thru the Main Entrance and check in at the main information desk.  Wear comfortable clothing and clothing appropriate for easy access to any Portacath or PICC line.   We strive to give you quality time with your provider. You may need to reschedule your appointment if you arrive late (15 or more minutes).  Arriving late affects you and other patients whose appointments are after yours.  Also, if you miss three or more appointments without notifying the office, you may be dismissed from the clinic at the provider's discretion.      For prescription refill requests, have your pharmacy contact our office and allow 72 hours for refills to be completed.        To help prevent nausea and vomiting after your treatment, we encourage you to take your nausea medication as directed.  BELOW ARE SYMPTOMS THAT SHOULD BE REPORTED IMMEDIATELY: *FEVER GREATER THAN 100.4 F (38 C) OR HIGHER *CHILLS OR SWEATING *NAUSEA AND VOMITING THAT IS NOT CONTROLLED WITH YOUR NAUSEA MEDICATION *UNUSUAL SHORTNESS OF BREATH *UNUSUAL BRUISING OR BLEEDING *URINARY PROBLEMS (pain or burning when urinating, or frequent urination) *BOWEL PROBLEMS (unusual diarrhea, constipation, pain near the anus) TENDERNESS IN MOUTH AND THROAT WITH OR WITHOUT PRESENCE OF ULCERS (sore throat, sores in mouth, or a toothache) UNUSUAL RASH, SWELLING OR PAIN  UNUSUAL VAGINAL DISCHARGE OR ITCHING   Items with * indicate a potential emergency and should be followed up as soon as possible or go to the Emergency Department if any problems should occur.  Please show the CHEMOTHERAPY ALERT CARD or IMMUNOTHERAPY ALERT CARD at check-in to the Emergency Department and triage nurse.  Should you have questions after your visit or need to cancel  or reschedule your appointment, please contact [facility] [phone]  and follow the prompts.  Office hours are 8:00 a.m. to 4:30 p.m. Monday - Friday. Please note that voicemails left after 4:00 p.m. may not be returned until the following business day.  We are closed weekends and major holidays. You have access to a nurse at all times for urgent questions. Please call the main number to the clinic [phone] and follow the prompts.  For any non-urgent questions, you may also contact your provider using MyChart. We now offer e-Visits for anyone 18 and older to request care online for non-urgent symptoms. For details visit [email or web address].   Also download the MyChart app! Go to the app store, search "MyChart", open the app, select [facility], and log in with your MyChart username and password.  Due to Covid, a mask is required upon entering the hospital/clinic. If you do not have a mask, one will be given to you upon arrival. For doctor visits, patients may have 1 support person aged 18 or older with them. For treatment visits, patients cannot have anyone with them due to current Covid guidelines and our immunocompromised population.  

## 2021-06-01 NOTE — Progress Notes (Signed)
Treatment given per orders. Patient tolerated it well without problems. Vitals stable and discharged home from clinic ambulatory. Follow up as scheduled.  

## 2021-06-05 DIAGNOSIS — N186 End stage renal disease: Secondary | ICD-10-CM | POA: Diagnosis not present

## 2021-06-05 DIAGNOSIS — C9000 Multiple myeloma not having achieved remission: Secondary | ICD-10-CM | POA: Diagnosis not present

## 2021-06-05 DIAGNOSIS — Z01818 Encounter for other preprocedural examination: Secondary | ICD-10-CM | POA: Diagnosis not present

## 2021-06-06 ENCOUNTER — Other Ambulatory Visit: Payer: Self-pay

## 2021-06-06 ENCOUNTER — Inpatient Hospital Stay (HOSPITAL_COMMUNITY): Payer: Medicare HMO

## 2021-06-06 ENCOUNTER — Inpatient Hospital Stay (HOSPITAL_COMMUNITY): Payer: Medicare HMO | Attending: Hematology

## 2021-06-06 ENCOUNTER — Encounter (HOSPITAL_COMMUNITY): Payer: Self-pay

## 2021-06-06 VITALS — BP 113/69 | HR 76 | Temp 97.7°F | Resp 18 | Wt 245.6 lb

## 2021-06-06 DIAGNOSIS — Z5112 Encounter for antineoplastic immunotherapy: Secondary | ICD-10-CM | POA: Insufficient documentation

## 2021-06-06 DIAGNOSIS — C9000 Multiple myeloma not having achieved remission: Secondary | ICD-10-CM | POA: Insufficient documentation

## 2021-06-06 DIAGNOSIS — Z5111 Encounter for antineoplastic chemotherapy: Secondary | ICD-10-CM | POA: Diagnosis not present

## 2021-06-06 LAB — CBC WITH DIFFERENTIAL/PLATELET
Abs Immature Granulocytes: 0.09 10*3/uL — ABNORMAL HIGH (ref 0.00–0.07)
Basophils Absolute: 0 10*3/uL (ref 0.0–0.1)
Basophils Relative: 1 %
Eosinophils Absolute: 0.2 10*3/uL (ref 0.0–0.5)
Eosinophils Relative: 4 %
HCT: 30.6 % — ABNORMAL LOW (ref 39.0–52.0)
Hemoglobin: 10.1 g/dL — ABNORMAL LOW (ref 13.0–17.0)
Immature Granulocytes: 1 %
Lymphocytes Relative: 33 %
Lymphs Abs: 2.1 10*3/uL (ref 0.7–4.0)
MCH: 33.4 pg (ref 26.0–34.0)
MCHC: 33 g/dL (ref 30.0–36.0)
MCV: 101.3 fL — ABNORMAL HIGH (ref 80.0–100.0)
Monocytes Absolute: 1.1 10*3/uL — ABNORMAL HIGH (ref 0.1–1.0)
Monocytes Relative: 17 %
Neutro Abs: 2.9 10*3/uL (ref 1.7–7.7)
Neutrophils Relative %: 44 %
Platelets: 185 10*3/uL (ref 150–400)
RBC: 3.02 MIL/uL — ABNORMAL LOW (ref 4.22–5.81)
RDW: 16.7 % — ABNORMAL HIGH (ref 11.5–15.5)
WBC: 6.4 10*3/uL (ref 4.0–10.5)
nRBC: 12 % — ABNORMAL HIGH (ref 0.0–0.2)

## 2021-06-06 LAB — COMPREHENSIVE METABOLIC PANEL
ALT: 26 U/L (ref 0–44)
AST: 20 U/L (ref 15–41)
Albumin: 3.5 g/dL (ref 3.5–5.0)
Alkaline Phosphatase: 37 U/L — ABNORMAL LOW (ref 38–126)
Anion gap: 9 (ref 5–15)
BUN: 37 mg/dL — ABNORMAL HIGH (ref 8–23)
CO2: 20 mmol/L — ABNORMAL LOW (ref 22–32)
Calcium: 8.3 mg/dL — ABNORMAL LOW (ref 8.9–10.3)
Chloride: 110 mmol/L (ref 98–111)
Creatinine, Ser: 2.98 mg/dL — ABNORMAL HIGH (ref 0.61–1.24)
GFR, Estimated: 23 mL/min — ABNORMAL LOW (ref >60)
Glucose, Bld: 112 mg/dL — ABNORMAL HIGH (ref 70–99)
Potassium: 4.4 mmol/L (ref 3.5–5.1)
Sodium: 139 mmol/L (ref 135–145)
Total Bilirubin: 0.6 mg/dL (ref 0.3–1.2)
Total Protein: 7.2 g/dL (ref 6.5–8.1)

## 2021-06-06 LAB — MAGNESIUM: Magnesium: 2.2 mg/dL (ref 1.7–2.4)

## 2021-06-06 LAB — LACTATE DEHYDROGENASE: LDH: 208 U/L — ABNORMAL HIGH (ref 98–192)

## 2021-06-06 MED ORDER — PALONOSETRON HCL INJECTION 0.25 MG/5ML
0.2500 mg | Freq: Once | INTRAVENOUS | Status: AC
  Administered 2021-06-06: 0.25 mg via INTRAVENOUS
  Filled 2021-06-06: qty 5

## 2021-06-06 MED ORDER — SODIUM CHLORIDE 0.9 % IV SOLN: Freq: Once | INTRAVENOUS | Status: AC

## 2021-06-06 MED ORDER — SODIUM CHLORIDE 0.9% FLUSH: 10.0000 mL | INTRAVENOUS | Status: DC | PRN

## 2021-06-06 MED ORDER — SODIUM CHLORIDE 0.9 % IV SOLN
300.0000 mg/m2 | Freq: Once | INTRAVENOUS | Status: AC
  Administered 2021-06-06: 680 mg via INTRAVENOUS
  Filled 2021-06-06: qty 34

## 2021-06-06 MED ORDER — BORTEZOMIB CHEMO SQ INJECTION 3.5 MG (2.5MG/ML)
1.5000 mg/m2 | Freq: Once | INTRAMUSCULAR | Status: AC
  Administered 2021-06-06: 3.5 mg via SUBCUTANEOUS
  Filled 2021-06-06: qty 1.4

## 2021-06-06 MED ORDER — SODIUM CHLORIDE 0.9 % IV SOLN
40.0000 mg | Freq: Once | INTRAVENOUS | Status: AC
  Administered 2021-06-06: 40 mg via INTRAVENOUS
  Filled 2021-06-06: qty 4

## 2021-06-06 NOTE — Patient Instructions (Signed)
[facility]  Discharge Instructions: Thank you for choosing [facility] to provide your oncology and hematology care.  If you have a lab appointment with the [facility], please come in thru the Main Entrance and check in at the main information desk.  Wear comfortable clothing and clothing appropriate for easy access to any Portacath or PICC line.   We strive to give you quality time with your provider. You may need to reschedule your appointment if you arrive late (15 or more minutes).  Arriving late affects you and other patients whose appointments are after yours.  Also, if you miss three or more appointments without notifying the office, you may be dismissed from the clinic at the provider's discretion.      For prescription refill requests, have your pharmacy contact our office and allow 72 hours for refills to be completed.    Today you received the following chemotherapy and/or immunotherapy agents velcade, cytoxan      To help prevent nausea and vomiting after your treatment, we encourage you to take your nausea medication as directed.  BELOW ARE SYMPTOMS THAT SHOULD BE REPORTED IMMEDIATELY: *FEVER GREATER THAN 100.4 F (38 C) OR HIGHER *CHILLS OR SWEATING *NAUSEA AND VOMITING THAT IS NOT CONTROLLED WITH YOUR NAUSEA MEDICATION *UNUSUAL SHORTNESS OF BREATH *UNUSUAL BRUISING OR BLEEDING *URINARY PROBLEMS (pain or burning when urinating, or frequent urination) *BOWEL PROBLEMS (unusual diarrhea, constipation, pain near the anus) TENDERNESS IN MOUTH AND THROAT WITH OR WITHOUT PRESENCE OF ULCERS (sore throat, sores in mouth, or a toothache) UNUSUAL RASH, SWELLING OR PAIN  UNUSUAL VAGINAL DISCHARGE OR ITCHING   Items with * indicate a potential emergency and should be followed up as soon as possible or go to the Emergency Department if any problems should occur.  Please show the CHEMOTHERAPY ALERT CARD or IMMUNOTHERAPY ALERT CARD at check-in to the Emergency  Department and triage nurse.  Should you have questions after your visit or need to cancel or reschedule your appointment, please contact [facility] [phone]  and follow the prompts.  Office hours are 8:00 a.m. to 4:30 p.m. Monday - Friday. Please note that voicemails left after 4:00 p.m. may not be returned until the following business day.  We are closed weekends and major holidays. You have access to a nurse at all times for urgent questions. Please call the main number to the clinic [phone] and follow the prompts.  For any non-urgent questions, you may also contact your provider using MyChart. We now offer e-Visits for anyone 50 and older to request care online for non-urgent symptoms. For details visit [email or web address].   Also download the MyChart app! Go to the app store, search "MyChart", open the app, select [facility], and log in with your MyChart username and password.  Due to Covid, a mask is required upon entering the hospital/clinic. If you do not have a mask, one will be given to you upon arrival. For doctor visits, patients may have 1 support person aged 16 or older with them. For treatment visits, patients cannot have anyone with them due to current Covid guidelines and our immunocompromised population.   Bortezomib injection What is this medication? BORTEZOMIB (bor TEZ oh mib) targets proteins in cancer cells and stops thecancer cells from growing. It treats multiple myeloma and mantle cell lymphoma. This medicine may be used for other purposes; ask your health care provider orpharmacist if you have questions. COMMON BRAND NAME(S): Velcade What should I tell my  care team before I take this medication? They need to know if you have any of these conditions: dehydration diabetes (high blood sugar) heart disease liver disease tingling of the fingers or toes or other nerve disorder an unusual or allergic reaction to bortezomib, mannitol, boron, other  medicines, foods, dyes, or preservatives pregnant or trying to get pregnant breast-feeding How should I use this medication? This medicine is injected into a vein or under the skin. It is given by ahealth care provider in a hospital or clinic setting. Talk to your health care provider about the use of this medicine in children.Special care may be needed. Overdosage: If you think you have taken too much of this medicine contact apoison control center or emergency room at once. NOTE: This medicine is only for you. Do not share this medicine with others. What if I miss a dose? Keep appointments for follow-up doses. It is important not to miss your dose.Call your health care provider if you are unable to keep an appointment. What may interact with this medication? This medicine may interact with the following medications: ketoconazole rifampin This list may not describe all possible interactions. Give your health care provider a list of all the medicines, herbs, non-prescription drugs, or dietary supplements you use. Also tell them if you smoke, drink alcohol, or use illegaldrugs. Some items may interact with your medicine. What should I watch for while using this medication? Your condition will be monitored carefully while you are receiving thismedicine. You may need blood work done while you are taking this medicine. You may get drowsy or dizzy. Do not drive, use machinery, or do anything that needs mental alertness until you know how this medicine affects you. Do not stand up or sit up quickly, especially if you are an older patient. Thisreduces the risk of dizzy or fainting spells This medicine may increase your risk of getting an infection. Call your health care provider for advice if you get a fever, chills, sore throat, or other symptoms of a cold or flu. Do not treat yourself. Try to avoid being aroundpeople who are sick. Check with your health care provider if you have severe diarrhea,  nausea, and vomiting, or if you sweat a lot. The loss of too much body fluid may make itdangerous for you to take this medicine. Do not become pregnant while taking this medicine or for 7 months after stopping it. Women should inform their health care provider if they wish to become pregnant or think they might be pregnant. Men should not father a child while taking this medicine and for 4 months after stopping it. There is a potential for serious harm to an unborn child. Talk to your health care provider for more information. Do not breast-feed an infant while taking thismedicine or for 2 months after stopping it. This medicine may make it more difficult to get pregnant or father a child.Talk to your health care provider if you are concerned about your fertility. What side effects may I notice from receiving this medication? Side effects that you should report to your doctor or health care professionalas soon as possible: allergic reactions (skin rash; itching or hives; swelling of the face, lips, or tongue) bleeding (bloody or black, tarry stools; red or dark brown urine; spitting up blood or brown material that looks like coffee grounds; red spots on the skin; unusual bruising or bleeding from the eye, gums, or nose) blurred vision or changes in vision confusion constipation headache heart failure (trouble breathing;  fast, irregular heartbeat; sudden weight gain; swelling of the ankles, feet, hands) infection (fever, chills, cough, sore throat, pain or trouble passing urine) lack or loss of appetite liver injury (dark yellow or brown urine; general ill feeling or flu-like symptoms; loss of appetite, right upper belly pain; yellowing of the eyes or skin) low blood pressure (dizziness; feeling faint or lightheaded, falls; unusually weak or tired) muscle cramps pain, redness, or irritation at site where injected pain, tingling, numbness in the hands or feet seizures trouble breathing unusual  bruising or bleeding Side effects that usually do not require medical attention (report to yourdoctor or health care professional if they continue or are bothersome): diarrhea nausea stomach pain trouble sleeping vomiting This list may not describe all possible side effects. Call your doctor for medical advice about side effects. You may report side effects to FDA at1-800-FDA-1088. Where should I keep my medication? This medicine is given in a hospital or clinic. It will not be stored at home. NOTE: This sheet is a summary. It may not cover all possible information. If you have questions about this medicine, talk to your doctor, pharmacist, orhealth care provider.  2022 Elsevier/Gold Standard (2020-10-12 13:22:53)   Cyclophosphamide Injection What is this medication? CYCLOPHOSPHAMIDE (sye kloe FOSS fa mide) is a chemotherapy drug. It slows the growth of cancer cells. This medicine is used to treat many types of cancer like lymphoma, myeloma, leukemia, breast cancer, and ovarian cancer, to name afew. This medicine may be used for other purposes; ask your health care provider orpharmacist if you have questions. COMMON BRAND NAME(S): Cytoxan, Neosar What should I tell my care team before I take this medication? They need to know if you have any of these conditions: heart disease history of irregular heartbeat infection kidney disease liver disease low blood counts, like white cells, platelets, or red blood cells on hemodialysis recent or ongoing radiation therapy scarring or thickening of the lungs trouble passing urine an unusual or allergic reaction to cyclophosphamide, other medicines, foods, dyes, or preservatives pregnant or trying to get pregnant breast-feeding How should I use this medication? This drug is usually given as an injection into a vein or muscle or by infusion into a vein. It is administered in a hospital or clinic by a specially trainedhealth care  professional. Talk to your pediatrician regarding the use of this medicine in children.Special care may be needed. Overdosage: If you think you have taken too much of this medicine contact apoison control center or emergency room at once. NOTE: This medicine is only for you. Do not share this medicine with others. What if I miss a dose? It is important not to miss your dose. Call your doctor or health careprofessional if you are unable to keep an appointment. What may interact with this medication? amphotericin B azathioprine certain antivirals for HIV or hepatitis certain medicines for blood pressure, heart disease, irregular heart beat certain medicines that treat or prevent blood clots like warfarin certain other medicines for cancer cyclosporine etanercept indomethacin medicines that relax muscles for surgery medicines to increase blood counts metronidazole This list may not describe all possible interactions. Give your health care provider a list of all the medicines, herbs, non-prescription drugs, or dietary supplements you use. Also tell them if you smoke, drink alcohol, or use illegaldrugs. Some items may interact with your medicine. What should I watch for while using this medication? Your condition will be monitored carefully while you are receiving thismedicine. You may need blood work done  while you are taking this medicine. Drink water or other fluids as directed. Urinate often, even at night. Some products may contain alcohol. Ask your health care professional if this medicine contains alcohol. Be sure to tell all health care professionals you are taking this medicine. Certain medicines, like metronidazole and disulfiram, can cause an unpleasant reaction when taken with alcohol. The reaction includes flushing, headache, nausea, vomiting, sweating, and increased thirst. Thereaction can last from 30 minutes to several hours. Do not become pregnant while taking this medicine or  for 1 year after stopping it. Women should inform their health care professional if they wish to become pregnant or think they might be pregnant. Men should not father a child while taking this medicine and for 4 months after stopping it. There is potential for serious side effects to an unborn child. Talk to your health care professionalfor more information. Do not breast-feed an infant while taking this medicine or for 1 week afterstopping it. This medicine has caused ovarian failure in some women. This medicine may make it more difficult to get pregnant. Talk to your health care professional if [place] concerned about your fertility. This medicine has caused decreased sperm counts in some men. This may make it more difficult to father a child. Talk to your health care professional if [place] concerned about your fertility. Call your health care professional for advice if you get a fever, chills, or sore throat, or other symptoms of a cold or flu. Do not treat yourself. This medicine decreases your body's ability to fight infections. Try to avoid beingaround people who are sick. Avoid taking medicines that contain aspirin, acetaminophen, ibuprofen, naproxen, or ketoprofen unless instructed by your health care professional.These medicines may hide a fever. Talk to your health care professional about your risk of cancer. You may bemore at risk for certain types of cancer if you take this medicine. If you are going to need surgery or other procedure, tell your health careprofessional that you are using this medicine. Be careful brushing or flossing your teeth or using a toothpick because you may get an infection or bleed more easily. If you have any dental work done, Primary school teacher you are receiving this medicine. What side effects may I notice from receiving this medication? Side effects that you should report to your doctor or health care professionalas soon as possible: allergic reactions like skin  rash, itching or hives, swelling of the face, lips, or tongue breathing problems nausea, vomiting signs and symptoms of bleeding such as bloody or black, tarry stools; red or dark brown urine; spitting up blood or brown material that looks like coffee grounds; red spots on the skin; unusual bruising or bleeding from the eyes, gums, or nose signs and symptoms of heart failure like fast, irregular heartbeat, sudden weight gain; swelling of the ankles, feet, hands signs and symptoms of infection like fever; chills; cough; sore throat; pain or trouble passing urine signs and symptoms of kidney injury like trouble passing urine or change in the amount of urine signs and symptoms of liver injury like dark yellow or brown urine; general ill feeling or flu-like symptoms; light-colored stools; loss of appetite; nausea; right upper belly pain; unusually weak or tired; yellowing of the eyes or skin Side effects that usually do not require medical attention (report to yourdoctor or health care professional if they continue or are bothersome): confusion decreased hearing diarrhea facial flushing hair loss headache loss of appetite missed menstrual periods signs and symptoms of low  red blood cells or anemia such as unusually weak or tired; feeling faint or lightheaded; falls skin discoloration This list may not describe all possible side effects. Call your doctor for medical advice about side effects. You may report side effects to FDA at1-800-FDA-1088. Where should I keep my medication? This drug is given in a hospital or clinic and will not be stored at home. NOTE: This sheet is a summary. It may not cover all possible information. If you have questions about this medicine, talk to your doctor, pharmacist, orhealth care provider.  2022 Elsevier/Gold Standard (2019-07-26 09:53:29)

## 2021-06-06 NOTE — Progress Notes (Signed)
Pt here for cytoxan and velcade.  Vital signs WNL today.  Creatinine 2.98, LDH 208. Okay for treatment per Dr Raliegh Ip.  Tolerated treatment well today without incidence.  Stable during and after treatment.  Vital signs stable prior to discharge. AVS reviewed.  Discharged in stable condition ambulatory.

## 2021-06-07 LAB — KAPPA/LAMBDA LIGHT CHAINS
Kappa free light chain: 183.9 mg/L — ABNORMAL HIGH (ref 3.3–19.4)
Kappa, lambda light chain ratio: 18.21 — ABNORMAL HIGH (ref 0.26–1.65)
Lambda free light chains: 10.1 mg/L (ref 5.7–26.3)

## 2021-06-08 LAB — PROTEIN ELECTROPHORESIS, SERUM
A/G Ratio: 1.3 (ref 0.7–1.7)
Albumin ELP: 3.9 g/dL (ref 2.9–4.4)
Alpha-1-Globulin: 0.2 g/dL (ref 0.0–0.4)
Alpha-2-Globulin: 0.6 g/dL (ref 0.4–1.0)
Beta Globulin: 0.8 g/dL (ref 0.7–1.3)
Gamma Globulin: 1.4 g/dL (ref 0.4–1.8)
Globulin, Total: 3 g/dL (ref 2.2–3.9)
M-Spike, %: 1 g/dL — ABNORMAL HIGH
Total Protein ELP: 6.9 g/dL (ref 6.0–8.5)

## 2021-06-08 LAB — IMMUNOFIXATION ELECTROPHORESIS
IgA: 24 mg/dL — ABNORMAL LOW (ref 61–437)
IgG (Immunoglobin G), Serum: 1529 mg/dL (ref 603–1613)
IgM (Immunoglobulin M), Srm: 11 mg/dL — ABNORMAL LOW (ref 20–172)
Total Protein ELP: 6.8 g/dL (ref 6.0–8.5)

## 2021-06-12 NOTE — Progress Notes (Signed)
[place],  [address]   CLINIC:  Medical Oncology/Hematology  PCP:  Lindell Spar, MD [address] / [place] [address] [phone]   REASON FOR VISIT:  Follow-up for MGUS  PRIOR THERAPY: none  NGS Results: not done  CURRENT THERAPY: CyBorD weekly  BRIEF ONCOLOGIC HISTORY:  Oncology History  Multiple myeloma ([place])  03/06/2021 Initial Diagnosis   Multiple myeloma ([place])    03/06/2021 Cancer Staging   Staging form: Plasma Cell Myeloma and Plasma Cell Disorders, AJCC 8th Edition - Clinical stage from 03/06/2021: RISS Stage II (Beta-2-microglobulin (mg/L): 5.5, Albumin (g/dL): 3.5, ISS: Stage III, High-risk cytogenetics: Absent, LDH: Normal) - Signed by Derek Jack, MD on 03/06/2021  Stage prefix: Initial diagnosis  Beta 2 microglobulin range (mg/L): Greater than or equal to 5.5  Albumin range (g/dL): Greater than or equal to 3.5  Cytogenetics: No abnormalities  Pretreatment monoclonal protein level in serum (M spike) (g/dL): 2  Pretreatment serum free kappa light chain level (g/L): 794  Pretreatment serum free lambda light chain level (g/L): 15.9    03/14/2021 -  Chemotherapy    Patient is on Treatment Plan: MULTIPLE MYELOMA CYBORD - WEEKLY BORTEZOMIB         CANCER STAGING: Cancer Staging Multiple myeloma ([place]) Staging form: Plasma Cell Myeloma and Plasma Cell Disorders, AJCC 8th Edition - Clinical stage from 03/06/2021: RISS Stage II (Beta-2-microglobulin (mg/L): 5.5, Albumin (g/dL): 3.5, ISS: Stage III, High-risk cytogenetics: Absent, LDH: Normal) - Signed by Derek Jack, MD on 03/06/2021   INTERVAL HISTORY:  Ernest Barnett, a 65 y.o. male, returns for routine follow-up and consideration for next cycle of chemotherapy. Ernest Barnett was last seen on 05/23/21.  Due for day #8 cycle #4 of CyBorD today.   Overall, he tells me he has been feeling pretty well. He denies n/v/d, but reports  intermittent cramping in his right hand and leg accompanied by increased frequency of urination at night which interrupts his sleep and occurs 2-3 times a week, lasting for a full day; he reports this cramping existed prior to starting chemotherapy. He reports increased appetite and a sore pain in his left chest. He denies tingling and numbness.   Overall, he feels ready for next cycle of chemo today.   REVIEW OF SYSTEMS:  Review of Systems  Constitutional:  Negative for appetite change (80%) and fatigue (75%).  Gastrointestinal:  Positive for constipation. Negative for diarrhea, nausea and vomiting.  Genitourinary:  Positive for nocturia.   Neurological:  Negative for numbness.  Psychiatric/Behavioral:  Positive for sleep disturbance.   All other systems reviewed and are negative.  PAST MEDICAL/SURGICAL HISTORY:  Past Medical History:  Diagnosis Date   Chronic kidney disease    COPD (chronic obstructive pulmonary disease) ([place])    CVA (cerebral vascular accident) ([place]) 2013   GERD (gastroesophageal reflux disease)    HOH (hard of hearing)    Hypercholesteremia    Hypertension    Stroke ([place]) 03/24/2012   left sided weakness   Vitamin D deficiency    Past Surgical History:  Procedure Laterality Date   AV FISTULA PLACEMENT Right 02/08/2021   Procedure: RIGHT ARM ARTERIOVENOUS (AV) FISTULA CREATION;  Surgeon: [name], MD;  Location: [facility];  Service: Vascular;  Laterality: Right;   BIOPSY  11/12/2018   Procedure: BIOPSY;  Surgeon: Rogene Houston, MD;  Location: [facility];  Service: Endoscopy;;  colon   BIOPSY  05/10/2020  Procedure: BIOPSY;  Surgeon: Rogene Houston, MD;  [facility];  Service: Endoscopy;;  esophagus   CATARACT EXTRACTION     right eye   CATARACT EXTRACTION W/PHACO  10/08/2012   Procedure: CATARACT EXTRACTION PHACO AND INTRAOCULAR LENS PLACEMENT ([place]);  Surgeon: Tonny Branch, MD;  Location: [facility];  Service: Ophthalmology;  Laterality: Left;   CDE:6.64   CHOLECYSTECTOMY     [place]   COLONOSCOPY N/A 11/12/2018   Procedure: COLONOSCOPY;  Surgeon: Rogene Houston, MD;  Location[facility];  Service: Endoscopy;  Laterality: N/A;  1030   ELBOW FRACTURE SURGERY     left   ESOPHAGEAL DILATION N/A 11/12/2018   Procedure: ESOPHAGEAL DILATION;  Surgeon: Rogene Houston, MD;  [facility];  Service: Endoscopy;  Laterality: N/A;   ESOPHAGEAL DILATION N/A 05/10/2020   Procedure: ESOPHAGEAL DILATION;  Surgeon: Rogene Houston, MD;  [facility];  Service: Endoscopy;  Laterality: N/A;   ESOPHAGOGASTRODUODENOSCOPY N/A 11/12/2018   Procedure: ESOPHAGOGASTRODUODENOSCOPY (EGD);  Surgeon: Rogene Houston, MD;  [facility];  Service: Endoscopy;  Laterality: N/A;   ESOPHAGOGASTRODUODENOSCOPY N/A 05/10/2020   Procedure: ESOPHAGOGASTRODUODENOSCOPY (EGD);  Surgeon: Rogene Houston, MD;  [facility];  Service: Endoscopy;  Laterality: N/A;  210   HERNIA REPAIR     right inguinal   HYDROCELE EXCISION / REPAIR     POLYPECTOMY  11/12/2018   Procedure: POLYPECTOMY;  Surgeon: Rogene Houston, MD;  [facility];  Service: Endoscopy;;  colon    RETINAL DETACHMENT SURGERY Left 2019   SPLENECTOMY, TOTAL     TONSILLECTOMY     VOCAL CORD INJECTION     removal of polyp-2005    SOCIAL HISTORY:  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  Tobacco Use   Smoking status: Former    Packs/day: 3.00    Years: 30.00    Pack years: 90.00    Types: Cigarettes   Smokeless tobacco: Current   Tobacco comments:    Currently vape  Vaping Use   Vaping Use: Every day   Substances: Nicotine  Substance and Sexual Activity   Alcohol use: Yes    Alcohol/week:  1.0 - 2.0 standard drink    Types: 1 - 2 Cans of beer per week    Comment: Occasionally   Drug use: No   Sexual activity: Not Currently    Birth control/protection: Abstinence, None  Other Topics Concern   Not on file  Social History Narrative   2 children, both nearby   Social Determinants of Health   Financial Resource Strain: Low Risk    Difficulty of Paying Living Expenses: Not hard at all  Food Insecurity: No Food Insecurity   Worried About Charity fundraiser in the Last Year: Never true   Arboriculturist in the Last Year: Never true  Transportation Needs: No Transportation Needs   Lack of Transportation (Medical): No   Lack of Transportation (Non-Medical): No  Physical Activity: Insufficiently Active   Days of Exercise per Week: 3 days   Minutes of Exercise per Session: 30 min  Stress: No Stress Concern Present  Feeling of Stress : Only a little  Social Connections: Socially Isolated   Frequency of Communication with Friends and Family: More than three times a week   Frequency of Social Gatherings with Friends and Family: More than three times a week   Attends Religious Services: Never   Marine scientist or Organizations: No   Attends Music therapist: Never   Marital Status: Divorced  Human resources officer Violence: Not At Risk   Fear of Current or Ex-Partner: No   Emotionally Abused: No   Physically Abused: No   Sexually Abused: No    FAMILY HISTORY:  No family history on file.  CURRENT MEDICATIONS:  Current Outpatient Medications  Medication Sig Dispense Refill   amLODipine (NORVASC) 5 MG tablet Take 5 mg by mouth daily.     aspirin EC 81 MG tablet Take 1 tablet (81 mg total) by mouth daily. 30 tablet 11   bortezomib IV (VELCADE) 3.5 MG injection Inject 1.5 mg/m2 into the vein once a week.     calcitRIOL (ROCALTROL) 0.25 MCG capsule Take 0.25 mcg by mouth every Monday, Wednesday, and Friday.     Calcium Carbonate-Vit D-Min (CALCIUM 1200 PO)  Take 1-2 tablets by mouth daily.     cholecalciferol (VITAMIN D3) 25 MCG (1000 UNIT) tablet Take 1,000 Units by mouth daily.     CYCLOPHOSPHAMIDE IV Inject 300 mg/m2 into the vein once a week.     dexamethasone 40 mg in sodium chloride 0.9 % 50 mL Inject 40 mg into the vein once a week.     furosemide (LASIX) 20 MG tablet Take 20 mg by mouth daily.     Multiple Vitamin (MULTIVITAMIN) tablet Take by mouth.     Omega-3 Fatty Acids (FISH OIL) 1000 MG CAPS Take 1,000 mg by mouth in the morning, at noon, and at bedtime.      pantoprazole (PROTONIX) 40 MG tablet Take 1 tablet (40 mg total) by mouth 2 (two) times daily. 180 tablet 3   polyethylene glycol (MIRALAX / GLYCOLAX) packet Take 17 g by mouth daily. Patient states that he takes as needed. 30 each 5   pravastatin (PRAVACHOL) 40 MG tablet Take 40 mg by mouth daily.     sodium bicarbonate 650 MG tablet Take 650 mg by mouth 3 (three) times daily.     tamsulosin (FLOMAX) 0.4 MG CAPS capsule Take 0.4 mg by mouth daily.      No current facility-administered medications for this visit.    ALLERGIES:  No Known Allergies  PHYSICAL EXAM:  Performance status (ECOG): 1 - Symptomatic but completely ambulatory  Vitals:   06/13/21 1242  BP: 116/67  Pulse: 76  Resp: 18  Temp: (!) 97.2 F (36.2 C)  SpO2: 96%   Wt Readings from Last 3 Encounters:  06/13/21 244 lb 14.4 oz (111.1 kg)  06/06/21 245 lb 9.6 oz (111.4 kg)  06/01/21 242 lb (109.8 kg)   Physical Exam Vitals reviewed.  Constitutional:      Appearance: Normal appearance.  Cardiovascular:     Rate and Rhythm: Normal rate and regular rhythm.     Pulses: Normal pulses.     Heart sounds: Normal heart sounds.  Pulmonary:     Effort: Pulmonary effort is normal.     Breath sounds: Normal breath sounds.  Neurological:     General: No focal deficit present.     Mental Status: He is alert and oriented to person, place, and time.  Psychiatric:  Mood and Affect: Mood normal.         Behavior: Behavior normal.    LABORATORY DATA:  I have reviewed the labs as listed.  CBC Latest Ref Rng & Units 06/13/2021 06/06/2021 05/30/2021  WBC 4.0 - 10.5 K/uL 6.9 6.4 6.9  Hemoglobin 13.0 - 17.0 g/dL 10.2(L) 10.1(L) 10.1(L)  Hematocrit 39.0 - 52.0 % 30.6(L) 30.6(L) 29.8(L)  Platelets 150 - 400 K/uL 157 185 162   CMP Latest Ref Rng & Units 06/13/2021 06/06/2021 05/30/2021  Glucose 70 - 99 mg/dL 105(H) 112(H) 132(H)  BUN 8 - 23 mg/dL 37(H) 37(H) 31(H)  Creatinine 0.61 - 1.24 mg/dL 3.02(H) 2.98(H) 3.03(H)  Sodium 135 - 145 mmol/L 138 139 134(L)  Potassium 3.5 - 5.1 mmol/L 4.1 4.4 3.9  Chloride 98 - 111 mmol/L 112(H) 110 108  CO2 22 - 32 mmol/L 21(L) 20(L) 22  Calcium 8.9 - 10.3 mg/dL 8.9 8.3(L) 8.7(L)  Total Protein 6.5 - 8.1 g/dL 7.2 7.2 7.0  Total Bilirubin 0.3 - 1.2 mg/dL 0.8 0.6 0.6  Alkaline Phos 38 - 126 U/L 36(L) 37(L) 35(L)  AST 15 - 41 U/L _0 ALT 0 - 44 U/L _1 DIAGNOSTIC IMAGING:  I have independently reviewed the scans and discussed with the patient. US Venous Img Lower Unilateral Right  Result Date: 05/23/2021 CLINICAL DATA:  Right lower extremity pain and swelling EXAM: RIGHT LOWER EXTREMITY VENOUS DOPPLER ULTRASOUND TECHNIQUE: Gray-scale sonography with compression, as well as color and duplex ultrasound, were performed to evaluate the deep venous system(s) from the level of the common femoral vein through the popliteal and proximal calf veins. COMPARISON:  None. FINDINGS: VENOUS Normal compressibility of the common femoral, superficial femoral, and popliteal veins, as well as the visualized calf veins. Visualized portions of profunda femoral vein and great saphenous vein unremarkable. No filling defects to suggest DVT on grayscale or color Doppler imaging. Doppler waveforms show normal direction of venous flow, normal respiratory plasticity and response to augmentation. Limited views of the contralateral common femoral vein are unremarkable. OTHER None.  Limitations: none IMPRESSION: Negative. Electronically Signed   By: Jacqulynn Cadet M.D.   On: 05/23/2021 15:37     ASSESSMENT:  1.  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  Basis for CKD is hypertension associated arteriosclerosis with arterionephrosclerosis. - Bone marrow biopsy on 02/21/2021 with slightly hypercellular bone marrow with trilineage hematopoiesis.  Plasma cells increased in number representing 11% of all cells with kappa light chain restriction. - Myeloma FISH panel with no evidence of T p53.  Plasma cell enrichment yielded limited cellularity, therefore only tested for T p53 probes at. - Chromosome analysis 45, X,- Y (8)/46, XY (12) - Labs on 02/01/2021-M spike 2 g, kappa light chain 794, free light chain ratio 49.97.  Beta-2 microglobulin 5.5, albumin 3.5.  LDH normal. - PET scan on 03/08/2021 with no evidence of active myeloma.  No soft tissue plasmacytoma. - 24-hour urine total protein 2.2 g on 03/13/2021.  Immunofixation positive for IgG kappa. - CyBorD started on 03/14/2021.   2.  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  Smoked 2 packs/day for 32 years and quit smoking cigarettes and vaping now. -Mother had small cell lung cancer.   3.  CKD: -He has stage V CKD thought to be secondary to hypertension. -Renal ultrasound on 12/19/2020 was  negative for obstructive uropathy and findings were consistent with chronic medical renal disease.  Bilateral kidney cysts were seen. -He has 2.7 g of proteinuria on 24-hour urine. -Kidney biopsy was done on 01/25/2021.   PLAN:  1.  Stage II, standard risk IgG kappa multiple myeloma: - He has completed 3 cycles of CyBorD. - He reported right hand cramps about 3 times per week.  He had cramps even prior to start of chemotherapy but they have gotten slightly worse.   Right hand cramps may last a whole day.  He usually gets them after eating.  He also reported right leg cramps at nighttime which gets better on urination.  He will continue using pickle juice for the cramps which helps. - He does not report any GI side effects including nausea or vomiting.  No tingling or numbness in the extremities. - Reviewed myeloma labs from 06/06/2021 which showed M spike is stable at 1 g.  Free light chain ratio has improved to 18.21 with free kappa light chains at 183.  His creatinine today is 3.02. - He will proceed with the cycle 4-day 8 today. - He complained of weight gain due to increased appetite.  I will cut back on dexamethasone dose to 20 mg weekly. - RTC 4 weeks for follow-up.  Myeloma panel in 3 weeks.   2.  Smoking history: - CT chest on 02/15/2021 was lung RADS 1.   3.  ID prophylaxis: - Continue aspirin for thromboprophylaxis. - Continue acyclovir twice daily.   4.  Myeloma bone disease: - Continue denosumab monthly.  Continue calcium and vitamin D supplements.  5.  Lower extremity swelling: - Continue Lasix 20 mg twice daily.   Orders placed this encounter:  No orders of the defined types were placed in this encounter.    Derek Jack, MD [place] [phone]   I, Thana Ates, am acting as a scribe for Dr. Derek Jack.  I, Derek Jack MD, have reviewed the above documentation for accuracy and completeness, and I agree with the above.

## 2021-06-13 ENCOUNTER — Inpatient Hospital Stay (HOSPITAL_COMMUNITY): Payer: Medicare HMO

## 2021-06-13 ENCOUNTER — Other Ambulatory Visit: Payer: Self-pay

## 2021-06-13 ENCOUNTER — Other Ambulatory Visit (HOSPITAL_COMMUNITY): Payer: Medicare HMO

## 2021-06-13 ENCOUNTER — Inpatient Hospital Stay (HOSPITAL_BASED_OUTPATIENT_CLINIC_OR_DEPARTMENT_OTHER): Payer: Medicare HMO | Admitting: Hematology

## 2021-06-13 VITALS — BP 116/67 | HR 76 | Temp 97.2°F | Resp 18 | Wt 244.9 lb

## 2021-06-13 VITALS — BP 113/54 | HR 87 | Temp 96.9°F | Resp 18

## 2021-06-13 DIAGNOSIS — C9000 Multiple myeloma not having achieved remission: Secondary | ICD-10-CM

## 2021-06-13 DIAGNOSIS — Z5111 Encounter for antineoplastic chemotherapy: Secondary | ICD-10-CM | POA: Diagnosis not present

## 2021-06-13 DIAGNOSIS — D472 Monoclonal gammopathy: Secondary | ICD-10-CM | POA: Diagnosis not present

## 2021-06-13 DIAGNOSIS — Z5112 Encounter for antineoplastic immunotherapy: Secondary | ICD-10-CM | POA: Diagnosis not present

## 2021-06-13 LAB — CBC WITH DIFFERENTIAL/PLATELET
Abs Immature Granulocytes: 0.09 10*3/uL — ABNORMAL HIGH (ref 0.00–0.07)
Basophils Absolute: 0 10*3/uL (ref 0.0–0.1)
Basophils Relative: 0 %
Eosinophils Absolute: 0.3 10*3/uL (ref 0.0–0.5)
Eosinophils Relative: 4 %
HCT: 30.6 % — ABNORMAL LOW (ref 39.0–52.0)
Hemoglobin: 10.2 g/dL — ABNORMAL LOW (ref 13.0–17.0)
Immature Granulocytes: 1 %
Lymphocytes Relative: 27 %
Lymphs Abs: 1.8 10*3/uL (ref 0.7–4.0)
MCH: 33.7 pg (ref 26.0–34.0)
MCHC: 33.3 g/dL (ref 30.0–36.0)
MCV: 101 fL — ABNORMAL HIGH (ref 80.0–100.0)
Monocytes Absolute: 1 10*3/uL (ref 0.1–1.0)
Monocytes Relative: 14 %
Neutro Abs: 3.7 10*3/uL (ref 1.7–7.7)
Neutrophils Relative %: 54 %
Platelets: 157 10*3/uL (ref 150–400)
RBC: 3.03 MIL/uL — ABNORMAL LOW (ref 4.22–5.81)
RDW: 17 % — ABNORMAL HIGH (ref 11.5–15.5)
WBC: 6.9 10*3/uL (ref 4.0–10.5)
nRBC: 6.8 % — ABNORMAL HIGH (ref 0.0–0.2)

## 2021-06-13 LAB — COMPREHENSIVE METABOLIC PANEL
ALT: 24 U/L (ref 0–44)
AST: 16 U/L (ref 15–41)
Albumin: 3.6 g/dL (ref 3.5–5.0)
Alkaline Phosphatase: 36 U/L — ABNORMAL LOW (ref 38–126)
Anion gap: 5 (ref 5–15)
BUN: 37 mg/dL — ABNORMAL HIGH (ref 8–23)
CO2: 21 mmol/L — ABNORMAL LOW (ref 22–32)
Calcium: 8.9 mg/dL (ref 8.9–10.3)
Chloride: 112 mmol/L — ABNORMAL HIGH (ref 98–111)
Creatinine, Ser: 3.02 mg/dL — ABNORMAL HIGH (ref 0.61–1.24)
GFR, Estimated: 22 mL/min — ABNORMAL LOW (ref >60)
Glucose, Bld: 105 mg/dL — ABNORMAL HIGH (ref 70–99)
Potassium: 4.1 mmol/L (ref 3.5–5.1)
Sodium: 138 mmol/L (ref 135–145)
Total Bilirubin: 0.8 mg/dL (ref 0.3–1.2)
Total Protein: 7.2 g/dL (ref 6.5–8.1)

## 2021-06-13 LAB — MAGNESIUM: Magnesium: 2.2 mg/dL (ref 1.7–2.4)

## 2021-06-13 MED ORDER — PALONOSETRON HCL INJECTION 0.25 MG/5ML
0.2500 mg | Freq: Once | INTRAVENOUS | Status: AC
  Administered 2021-06-13: 0.25 mg via INTRAVENOUS

## 2021-06-13 MED ORDER — BORTEZOMIB CHEMO SQ INJECTION 3.5 MG (2.5MG/ML)
1.5000 mg/m2 | Freq: Once | INTRAMUSCULAR | Status: AC
  Administered 2021-06-13: 3.5 mg via SUBCUTANEOUS
  Filled 2021-06-13: qty 1.4

## 2021-06-13 MED ORDER — PALONOSETRON HCL INJECTION 0.25 MG/5ML
INTRAVENOUS | Status: AC
  Filled 2021-06-13: qty 5

## 2021-06-13 MED ORDER — SODIUM CHLORIDE 0.9 % IV SOLN
20.0000 mg | Freq: Once | INTRAVENOUS | Status: AC
  Administered 2021-06-13: 20 mg via INTRAVENOUS
  Filled 2021-06-13: qty 20

## 2021-06-13 MED ORDER — SODIUM CHLORIDE 0.9 % IV SOLN: Freq: Once | INTRAVENOUS | Status: AC

## 2021-06-13 MED ORDER — SODIUM CHLORIDE 0.9 % IV SOLN
300.0000 mg/m2 | Freq: Once | INTRAVENOUS | Status: AC
  Administered 2021-06-13: 680 mg via INTRAVENOUS
  Filled 2021-06-13: qty 34

## 2021-06-13 NOTE — Progress Notes (Signed)
Patient presents today for treatment and follow up visit with Dr. Delton Coombes. Vital signs within parameters for treatment. Creatinine today 3.02.   Message received from [facility] / Dr.Katragadda to proceed with treatment. Labs reviewed by MD  Treatment given today per MD orders. Tolerated infusion without adverse affects. Vital signs stable. No complaints at this time. Discharged from clinic ambulatory in stable condition. Alert and oriented x 3. F/U with [facility] as scheduled.

## 2021-06-13 NOTE — Progress Notes (Signed)
Patient has been assessed, vital signs and labs have been reviewed by Dr. Katragadda. ANC, Creatinine, LFTs, and Platelets are within treatment parameters per Dr. Katragadda. The patient is good to proceed with treatment at this time. Primary RN and pharmacy aware.  

## 2021-06-13 NOTE — Patient Instructions (Addendum)
[facility] at [facility] Discharge Instructions  You were seen today by Dr. Katragadda. He went over your recent results, and you received your treatment. Dr. Katragadda will see you back in 1 month for labs and follow up.   Thank you for choosing [facility] at [facility] to provide your oncology and hematology care.  To afford each patient quality time with our provider, please arrive at least 15 minutes before your scheduled appointment time.   If you have a lab appointment with the Cancer Center please come in thru the Main Entrance and check in at the main information desk  You need to re-schedule your appointment should you arrive 10 or more minutes late.  We strive to give you quality time with our providers, and arriving late affects you and other patients whose appointments are after yours.  Also, if you no show three or more times for appointments you may be dismissed from the clinic at the providers discretion.     Again, thank you for choosing [facility].  Our hope is that these requests will decrease the amount of time that you wait before being seen by our physicians.       _____________________________________________________________  Should you have questions after your visit to [facility], please contact our office at [phone] between the hours of 8:00 a.m. and 4:30 p.m.  Voicemails left after 4:00 p.m. will not be returned until the following business day.  For prescription refill requests, have your pharmacy contact our office and allow 72 hours.    Cancer Center Support Programs:   > Cancer Support Group  2nd Tuesday of the month 1pm-2pm, Journey Room   

## 2021-06-13 NOTE — Patient Instructions (Signed)
[facility]  Discharge Instructions: Thank you for choosing [facility] to provide your oncology and hematology care.  If you have a lab appointment with the [facility], please come in thru the Main Entrance and check in at the main information desk.  Wear comfortable clothing and clothing appropriate for easy access to any Portacath or PICC line.   We strive to give you quality time with your provider. You may need to reschedule your appointment if you arrive late (15 or more minutes).  Arriving late affects you and other patients whose appointments are after yours.  Also, if you miss three or more appointments without notifying the office, you may be dismissed from the clinic at the provider's discretion.      For prescription refill requests, have your pharmacy contact our office and allow 72 hours for refills to be completed.    Today you received the following chemotherapy and/or immunotherapy agents Velcade/Cytoxan.       To help prevent nausea and vomiting after your treatment, we encourage you to take your nausea medication as directed.  BELOW ARE SYMPTOMS THAT SHOULD BE REPORTED IMMEDIATELY: *FEVER GREATER THAN 100.4 F (38 C) OR HIGHER *CHILLS OR SWEATING *NAUSEA AND VOMITING THAT IS NOT CONTROLLED WITH YOUR NAUSEA MEDICATION *UNUSUAL SHORTNESS OF BREATH *UNUSUAL BRUISING OR BLEEDING *URINARY PROBLEMS (pain or burning when urinating, or frequent urination) *BOWEL PROBLEMS (unusual diarrhea, constipation, pain near the anus) TENDERNESS IN MOUTH AND THROAT WITH OR WITHOUT PRESENCE OF ULCERS (sore throat, sores in mouth, or a toothache) UNUSUAL RASH, SWELLING OR PAIN  UNUSUAL VAGINAL DISCHARGE OR ITCHING   Items with * indicate a potential emergency and should be followed up as soon as possible or go to the Emergency Department if any problems should occur.  Please show the CHEMOTHERAPY ALERT CARD or IMMUNOTHERAPY ALERT CARD at check-in to the  Emergency Department and triage nurse.  Should you have questions after your visit or need to cancel or reschedule your appointment, please contact [facility] [phone]  and follow the prompts.  Office hours are 8:00 a.m. to 4:30 p.m. Monday - Friday. Please note that voicemails left after 4:00 p.m. may not be returned until the following business day.  We are closed weekends and major holidays. You have access to a nurse at all times for urgent questions. Please call the main number to the clinic [phone] and follow the prompts.  For any non-urgent questions, you may also contact your provider using MyChart. We now offer e-Visits for anyone 19 and older to request care online for non-urgent symptoms. For details visit [email or web address].   Also download the MyChart app! Go to the app store, search "MyChart", open the app, select [facility], and log in with your MyChart username and password.  Due to Covid, a mask is required upon entering the hospital/clinic. If you do not have a mask, one will be given to you upon arrival. For doctor visits, patients may have 1 support person aged 53 or older with them. For treatment visits, patients cannot have anyone with them due to current Covid guidelines and our immunocompromised population.

## 2021-06-18 NOTE — Progress Notes (Signed)
I connected with  Ernest Barnett on 06/18/21 by an audio enabled telemedicine application and verified that I am speaking with the correct person using two identifiers.   I discussed the limitations, risks, security and privacy concerns of performing an evaluation and management service by telephone and the availability of in person appointments. I also discussed with the patient that there may be a patient responsible charge related to this service. The patient expressed understanding and verbally consented to this telephonic visit.   The patient was at home. The provider was Porfirio Mylar NP who was in office. This was a telehealth visit.

## 2021-06-20 ENCOUNTER — Inpatient Hospital Stay (HOSPITAL_COMMUNITY): Payer: Medicare HMO

## 2021-06-20 ENCOUNTER — Other Ambulatory Visit: Payer: Self-pay

## 2021-06-20 ENCOUNTER — Encounter (HOSPITAL_COMMUNITY): Payer: Self-pay

## 2021-06-20 VITALS — BP 110/69 | HR 77 | Temp 97.1°F | Resp 18 | Wt 246.6 lb

## 2021-06-20 DIAGNOSIS — I10 Essential (primary) hypertension: Secondary | ICD-10-CM | POA: Diagnosis not present

## 2021-06-20 DIAGNOSIS — Z139 Encounter for screening, unspecified: Secondary | ICD-10-CM | POA: Diagnosis not present

## 2021-06-20 DIAGNOSIS — Z5111 Encounter for antineoplastic chemotherapy: Secondary | ICD-10-CM | POA: Diagnosis not present

## 2021-06-20 DIAGNOSIS — C9000 Multiple myeloma not having achieved remission: Secondary | ICD-10-CM

## 2021-06-20 DIAGNOSIS — Z5112 Encounter for antineoplastic immunotherapy: Secondary | ICD-10-CM | POA: Diagnosis not present

## 2021-06-20 DIAGNOSIS — R351 Nocturia: Secondary | ICD-10-CM | POA: Diagnosis not present

## 2021-06-20 DIAGNOSIS — Z0001 Encounter for general adult medical examination with abnormal findings: Secondary | ICD-10-CM | POA: Diagnosis not present

## 2021-06-20 LAB — COMPREHENSIVE METABOLIC PANEL
ALT: 22 U/L (ref 0–44)
AST: 17 U/L (ref 15–41)
Albumin: 3.6 g/dL (ref 3.5–5.0)
Alkaline Phosphatase: 31 U/L — ABNORMAL LOW (ref 38–126)
Anion gap: 5 (ref 5–15)
BUN: 37 mg/dL — ABNORMAL HIGH (ref 8–23)
CO2: 22 mmol/L (ref 22–32)
Calcium: 8.7 mg/dL — ABNORMAL LOW (ref 8.9–10.3)
Chloride: 110 mmol/L (ref 98–111)
Creatinine, Ser: 3.26 mg/dL — ABNORMAL HIGH (ref 0.61–1.24)
GFR, Estimated: 20 mL/min — ABNORMAL LOW (ref >60)
Glucose, Bld: 104 mg/dL — ABNORMAL HIGH (ref 70–99)
Potassium: 4.3 mmol/L (ref 3.5–5.1)
Sodium: 137 mmol/L (ref 135–145)
Total Bilirubin: 0.7 mg/dL (ref 0.3–1.2)
Total Protein: 7.3 g/dL (ref 6.5–8.1)

## 2021-06-20 LAB — CBC WITH DIFFERENTIAL/PLATELET
Abs Immature Granulocytes: 0.06 10*3/uL (ref 0.00–0.07)
Basophils Absolute: 0.1 10*3/uL (ref 0.0–0.1)
Basophils Relative: 1 %
Eosinophils Absolute: 0.3 10*3/uL (ref 0.0–0.5)
Eosinophils Relative: 5 %
HCT: 29.6 % — ABNORMAL LOW (ref 39.0–52.0)
Hemoglobin: 9.8 g/dL — ABNORMAL LOW (ref 13.0–17.0)
Immature Granulocytes: 1 %
Lymphocytes Relative: 28 %
Lymphs Abs: 1.9 10*3/uL (ref 0.7–4.0)
MCH: 33.7 pg (ref 26.0–34.0)
MCHC: 33.1 g/dL (ref 30.0–36.0)
MCV: 101.7 fL — ABNORMAL HIGH (ref 80.0–100.0)
Monocytes Absolute: 1 10*3/uL (ref 0.1–1.0)
Monocytes Relative: 15 %
Neutro Abs: 3.4 10*3/uL (ref 1.7–7.7)
Neutrophils Relative %: 50 %
Platelets: 182 10*3/uL (ref 150–400)
RBC: 2.91 MIL/uL — ABNORMAL LOW (ref 4.22–5.81)
RDW: 16.2 % — ABNORMAL HIGH (ref 11.5–15.5)
WBC: 6.8 10*3/uL (ref 4.0–10.5)
nRBC: 4 % — ABNORMAL HIGH (ref 0.0–0.2)

## 2021-06-20 LAB — MAGNESIUM: Magnesium: 2.3 mg/dL (ref 1.7–2.4)

## 2021-06-20 MED ORDER — PALONOSETRON HCL INJECTION 0.25 MG/5ML
0.2500 mg | Freq: Once | INTRAVENOUS | Status: AC
  Administered 2021-06-20: 0.25 mg via INTRAVENOUS
  Filled 2021-06-20: qty 5

## 2021-06-20 MED ORDER — SODIUM CHLORIDE 0.9% FLUSH: 10.0000 mL | INTRAVENOUS | Status: DC | PRN

## 2021-06-20 MED ORDER — SODIUM CHLORIDE 0.9 % IV SOLN
20.0000 mg | Freq: Once | INTRAVENOUS | Status: AC
  Administered 2021-06-20: 20 mg via INTRAVENOUS
  Filled 2021-06-20: qty 20

## 2021-06-20 MED ORDER — BORTEZOMIB CHEMO SQ INJECTION 3.5 MG (2.5MG/ML)
1.5000 mg/m2 | Freq: Once | INTRAMUSCULAR | Status: AC
  Administered 2021-06-20: 3.5 mg via SUBCUTANEOUS
  Filled 2021-06-20: qty 1.4

## 2021-06-20 MED ORDER — SODIUM CHLORIDE 0.9 % IV SOLN: Freq: Once | INTRAVENOUS | Status: AC

## 2021-06-20 MED ORDER — SODIUM CHLORIDE 0.9 % IV SOLN
300.0000 mg/m2 | Freq: Once | INTRAVENOUS | Status: AC
  Administered 2021-06-20: 680 mg via INTRAVENOUS
  Filled 2021-06-20: qty 34

## 2021-06-20 NOTE — Patient Instructions (Addendum)
[facility]  Discharge Instructions: Thank you for choosing [facility] to provide your oncology and hematology care.  If you have a lab appointment with the Cancer Center, please come in thru the Main Entrance and check in at the main information desk.  Wear comfortable clothing and clothing appropriate for easy access to any Portacath or PICC line.   We strive to give you quality time with your provider. You may need to reschedule your appointment if you arrive late (15 or more minutes).  Arriving late affects you and other patients whose appointments are after yours.  Also, if you miss three or more appointments without notifying the office, you may be dismissed from the clinic at the provider's discretion.      For prescription refill requests, have your pharmacy contact our office and allow 72 hours for refills to be completed.    Today you received the following chemotherapy and/or immunotherapy agents velcade, cytoxan      To help prevent nausea and vomiting after your treatment, we encourage you to take your nausea medication as directed.  BELOW ARE SYMPTOMS THAT SHOULD BE REPORTED IMMEDIATELY: *FEVER GREATER THAN 100.4 F (38 C) OR HIGHER *CHILLS OR SWEATING *NAUSEA AND VOMITING THAT IS NOT CONTROLLED WITH YOUR NAUSEA MEDICATION *UNUSUAL SHORTNESS OF BREATH *UNUSUAL BRUISING OR BLEEDING *URINARY PROBLEMS (pain or burning when urinating, or frequent urination) *BOWEL PROBLEMS (unusual diarrhea, constipation, pain near the anus) TENDERNESS IN MOUTH AND THROAT WITH OR WITHOUT PRESENCE OF ULCERS (sore throat, sores in mouth, or a toothache) UNUSUAL RASH, SWELLING OR PAIN  UNUSUAL VAGINAL DISCHARGE OR ITCHING   Items with * indicate a potential emergency and should be followed up as soon as possible or go to the Emergency Department if any problems should occur.  Please show the CHEMOTHERAPY ALERT CARD or IMMUNOTHERAPY ALERT CARD at check-in to the Emergency  Department and triage nurse.  Should you have questions after your visit or need to cancel or reschedule your appointment, please contact [facility] [phone]  and follow the prompts.  Office hours are 8:00 a.m. to 4:30 p.m. Monday - Friday. Please note that voicemails left after 4:00 p.m. may not be returned until the following business day.  We are closed weekends and major holidays. You have access to a nurse at all times for urgent questions. Please call the main number to the clinic [phone] and follow the prompts.  For any non-urgent questions, you may also contact your provider using MyChart. We now offer e-Visits for anyone 18 and older to request care online for non-urgent symptoms. For details visit [email or web address].   Also download the MyChart app! Go to the app store, search "MyChart", open the app, select [facility], and log in with your MyChart username and password.  Due to Covid, a mask is required upon entering the hospital/clinic. If you do not have a mask, one will be given to you upon arrival. For doctor visits, patients may have 1 support person aged 18 or older with them. For treatment visits, patients cannot have anyone with them due to current Covid guidelines and our immunocompromised population.   Bortezomib injection What is this medication? BORTEZOMIB (bor TEZ oh mib) targets proteins in cancer cells and stops thecancer cells from growing. It treats multiple myeloma and mantle cell lymphoma. This medicine may be used for other purposes; ask your health care provider orpharmacist if you have questions. COMMON BRAND NAME(S): Velcade What should I tell my   care team before I take this medication? They need to know if you have any of these conditions: dehydration diabetes (high blood sugar) heart disease liver disease tingling of the fingers or toes or other nerve disorder an unusual or allergic reaction to bortezomib, mannitol, boron, other  medicines, foods, dyes, or preservatives pregnant or trying to get pregnant breast-feeding How should I use this medication? This medicine is injected into a vein or under the skin. It is given by ahealth care provider in a hospital or clinic setting. Talk to your health care provider about the use of this medicine in children.Special care may be needed. Overdosage: If you think you have taken too much of this medicine contact apoison control center or emergency room at once. NOTE: This medicine is only for you. Do not share this medicine with others. What if I miss a dose? Keep appointments for follow-up doses. It is important not to miss your dose.Call your health care provider if you are unable to keep an appointment. What may interact with this medication? This medicine may interact with the following medications: ketoconazole rifampin This list may not describe all possible interactions. Give your health care provider a list of all the medicines, herbs, non-prescription drugs, or dietary supplements you use. Also tell them if you smoke, drink alcohol, or use illegaldrugs. Some items may interact with your medicine. What should I watch for while using this medication? Your condition will be monitored carefully while you are receiving thismedicine. You may need blood work done while you are taking this medicine. You may get drowsy or dizzy. Do not drive, use machinery, or do anything that needs mental alertness until you know how this medicine affects you. Do not stand up or sit up quickly, especially if you are an older patient. Thisreduces the risk of dizzy or fainting spells This medicine may increase your risk of getting an infection. Call your health care provider for advice if you get a fever, chills, sore throat, or other symptoms of a cold or flu. Do not treat yourself. Try to avoid being aroundpeople who are sick. Check with your health care provider if you have severe diarrhea,  nausea, and vomiting, or if you sweat a lot. The loss of too much body fluid may make itdangerous for you to take this medicine. Do not become pregnant while taking this medicine or for 7 months after stopping it. Women should inform their health care provider if they wish to become pregnant or think they might be pregnant. Men should not father a child while taking this medicine and for 4 months after stopping it. There is a potential for serious harm to an unborn child. Talk to your health care provider for more information. Do not breast-feed an infant while taking thismedicine or for 2 months after stopping it. This medicine may make it more difficult to get pregnant or father a child.Talk to your health care provider if you are concerned about your fertility. What side effects may I notice from receiving this medication? Side effects that you should report to your doctor or health care professionalas soon as possible: allergic reactions (skin rash; itching or hives; swelling of the face, lips, or tongue) bleeding (bloody or black, tarry stools; red or dark brown urine; spitting up blood or brown material that looks like coffee grounds; red spots on the skin; unusual bruising or bleeding from the eye, gums, or nose) blurred vision or changes in vision confusion constipation headache heart failure (trouble breathing;  fast, irregular heartbeat; sudden weight gain; swelling of the ankles, feet, hands) infection (fever, chills, cough, sore throat, pain or trouble passing urine) lack or loss of appetite liver injury (dark yellow or brown urine; general ill feeling or flu-like symptoms; loss of appetite, right upper belly pain; yellowing of the eyes or skin) low blood pressure (dizziness; feeling faint or lightheaded, falls; unusually weak or tired) muscle cramps pain, redness, or irritation at site where injected pain, tingling, numbness in the hands or feet seizures trouble breathing unusual  bruising or bleeding Side effects that usually do not require medical attention (report to yourdoctor or health care professional if they continue or are bothersome): diarrhea nausea stomach pain trouble sleeping vomiting This list may not describe all possible side effects. Call your doctor for medical advice about side effects. You may report side effects to FDA at1-800-FDA-1088. Where should I keep my medication? This medicine is given in a hospital or clinic. It will not be stored at home. NOTE: This sheet is a summary. It may not cover all possible information. If you have questions about this medicine, talk to your doctor, pharmacist, orhealth care provider.  2022 Elsevier/Gold Standard (2020-10-12 13:22:53)   Cyclophosphamide Injection What is this medication? CYCLOPHOSPHAMIDE (sye kloe FOSS fa mide) is a chemotherapy drug. It slows the growth of cancer cells. This medicine is used to treat many types of cancer like lymphoma, myeloma, leukemia, breast cancer, and ovarian cancer, to name afew. This medicine may be used for other purposes; ask your health care provider orpharmacist if you have questions. COMMON BRAND NAME(S): Cytoxan, Neosar What should I tell my care team before I take this medication? They need to know if you have any of these conditions: heart disease history of irregular heartbeat infection kidney disease liver disease low blood counts, like white cells, platelets, or red blood cells on hemodialysis recent or ongoing radiation therapy scarring or thickening of the lungs trouble passing urine an unusual or allergic reaction to cyclophosphamide, other medicines, foods, dyes, or preservatives pregnant or trying to get pregnant breast-feeding How should I use this medication? This drug is usually given as an injection into a vein or muscle or by infusion into a vein. It is administered in a hospital or clinic by a specially trainedhealth care  professional. Talk to your pediatrician regarding the use of this medicine in children.Special care may be needed. Overdosage: If you think you have taken too much of this medicine contact apoison control center or emergency room at once. NOTE: This medicine is only for you. Do not share this medicine with others. What if I miss a dose? It is important not to miss your dose. Call your doctor or health careprofessional if you are unable to keep an appointment. What may interact with this medication? amphotericin B azathioprine certain antivirals for HIV or hepatitis certain medicines for blood pressure, heart disease, irregular heart beat certain medicines that treat or prevent blood clots like warfarin certain other medicines for cancer cyclosporine etanercept indomethacin medicines that relax muscles for surgery medicines to increase blood counts metronidazole This list may not describe all possible interactions. Give your health care provider a list of all the medicines, herbs, non-prescription drugs, or dietary supplements you use. Also tell them if you smoke, drink alcohol, or use illegaldrugs. Some items may interact with your medicine. What should I watch for while using this medication? Your condition will be monitored carefully while you are receiving thismedicine. You may need blood work done   you are taking this medicine. Drink water or other fluids as directed. Urinate often, even at night. Some products may contain alcohol. Ask your health care professional if this medicine contains alcohol. Be sure to tell all health care professionals you are taking this medicine. Certain medicines, like metronidazole and disulfiram, can cause an unpleasant reaction when taken with alcohol. The reaction includes flushing, headache, nausea, vomiting, sweating, and increased thirst. Thereaction can last from 30 minutes to several hours. Do not become pregnant while taking this medicine or  for 1 year after stopping it. Women should inform their health care professional if they wish to become pregnant or think they might be pregnant. Men should not father a child while taking this medicine and for 4 months after stopping it. There is potential for serious side effects to an unborn child. Talk to your health care professionalfor more information. Do not breast-feed an infant while taking this medicine or for 1 week afterstopping it. This medicine has caused ovarian failure in some women. This medicine may make it more difficult to get pregnant. Talk to your health care professional if [place] concerned about your fertility. This medicine has caused decreased sperm counts in some men. This may make it more difficult to father a child. Talk to your health care professional if [place] concerned about your fertility. Call your health care professional for advice if you get a fever, chills, or sore throat, or other symptoms of a cold or flu. Do not treat yourself. This medicine decreases your body's ability to fight infections. Try to avoid beingaround people who are sick. Avoid taking medicines that contain aspirin, acetaminophen, ibuprofen, naproxen, or ketoprofen unless instructed by your health care professional.These medicines may hide a fever. Talk to your health care professional about your risk of cancer. You may bemore at risk for certain types of cancer if you take this medicine. If you are going to need surgery or other procedure, tell your health careprofessional that you are using this medicine. Be careful brushing or flossing your teeth or using a toothpick because you may get an infection or bleed more easily. If you have any dental work done, Primary school teacher you are receiving this medicine. What side effects may I notice from receiving this medication? Side effects that you should report to your doctor or health care professionalas soon as possible: allergic reactions like skin  rash, itching or hives, swelling of the face, lips, or tongue breathing problems nausea, vomiting signs and symptoms of bleeding such as bloody or black, tarry stools; red or dark brown urine; spitting up blood or brown material that looks like coffee grounds; red spots on the skin; unusual bruising or bleeding from the eyes, gums, or nose signs and symptoms of heart failure like fast, irregular heartbeat, sudden weight gain; swelling of the ankles, feet, hands signs and symptoms of infection like fever; chills; cough; sore throat; pain or trouble passing urine signs and symptoms of kidney injury like trouble passing urine or change in the amount of urine signs and symptoms of liver injury like dark yellow or brown urine; general ill feeling or flu-like symptoms; light-colored stools; loss of appetite; nausea; right upper belly pain; unusually weak or tired; yellowing of the eyes or skin Side effects that usually do not require medical attention (report to yourdoctor or health care professional if they continue or are bothersome): confusion decreased hearing diarrhea facial flushing hair loss headache loss of appetite missed menstrual periods signs and symptoms of low red  blood cells or anemia such as unusually weak or tired; feeling faint or lightheaded; falls skin discoloration This list may not describe all possible side effects. Call your doctor for medical advice about side effects. You may report side effects to FDA at1-800-FDA-1088. Where should I keep my medication? This drug is given in a hospital or clinic and will not be stored at home. NOTE: This sheet is a summary. It may not cover all possible information. If you have questions about this medicine, talk to your doctor, pharmacist, orhealth care provider.  2022 Elsevier/Gold Standard (2019-07-26 09:53:29)

## 2021-06-20 NOTE — Progress Notes (Signed)
Pt here for cytoxan and velcade.  Creatinine 3.26.  MD aware of of elevated creatinine.   Pt tolerated treatment well today without incidence.  AVS reviewed. Vital signs stable prior to discharge.  Discharged in stable condition ambulatory.

## 2021-06-21 LAB — LIPID PANEL WITH LDL/HDL RATIO
Cholesterol, Total: 179 mg/dL (ref 100–199)
HDL: 34 mg/dL — ABNORMAL LOW (ref >39)
LDL Chol Calc (NIH): 106 mg/dL — ABNORMAL HIGH (ref 0–99)
LDL/HDL Ratio: 3.1 ratio (ref 0.0–3.6)
Triglycerides: 223 mg/dL — ABNORMAL HIGH (ref 0–149)
VLDL Cholesterol Cal: 39 mg/dL (ref 5–40)

## 2021-06-21 LAB — HIV ANTIBODY (ROUTINE TESTING W REFLEX): HIV Screen 4th Generation wRfx: NONREACTIVE

## 2021-06-21 LAB — HEPATITIS C ANTIBODY: Hep C Virus Ab: <0.1 {s_co_ratio} (ref 0.0–0.9)

## 2021-06-21 LAB — TSH: TSH: 1.2 u[IU]/mL (ref 0.450–4.500)

## 2021-06-21 LAB — PSA: Prostate Specific Ag, Serum: 0.9 ng/mL (ref 0.0–4.0)

## 2021-06-21 NOTE — Progress Notes (Signed)
Cholesterol slightly elevated, so we will discuss this at his appt on 8/22.

## 2021-06-25 ENCOUNTER — Other Ambulatory Visit: Payer: Self-pay

## 2021-06-25 ENCOUNTER — Ambulatory Visit (INDEPENDENT_AMBULATORY_CARE_PROVIDER_SITE_OTHER): Payer: Medicare HMO | Admitting: Nurse Practitioner

## 2021-06-25 ENCOUNTER — Encounter: Payer: Self-pay | Admitting: Nurse Practitioner

## 2021-06-25 VITALS — BP 126/77 | HR 93 | Temp 98.0°F | Ht 67.0 in | Wt 247.0 lb

## 2021-06-25 DIAGNOSIS — M7989 Other specified soft tissue disorders: Secondary | ICD-10-CM | POA: Diagnosis not present

## 2021-06-25 DIAGNOSIS — J449 Chronic obstructive pulmonary disease, unspecified: Secondary | ICD-10-CM | POA: Insufficient documentation

## 2021-06-25 DIAGNOSIS — M549 Dorsalgia, unspecified: Secondary | ICD-10-CM | POA: Insufficient documentation

## 2021-06-25 DIAGNOSIS — I10 Essential (primary) hypertension: Secondary | ICD-10-CM

## 2021-06-25 DIAGNOSIS — M545 Low back pain, unspecified: Secondary | ICD-10-CM

## 2021-06-25 DIAGNOSIS — Z0001 Encounter for general adult medical examination with abnormal findings: Secondary | ICD-10-CM

## 2021-06-25 DIAGNOSIS — G8929 Other chronic pain: Secondary | ICD-10-CM

## 2021-06-25 DIAGNOSIS — E782 Mixed hyperlipidemia: Secondary | ICD-10-CM

## 2021-06-25 MED ORDER — TIZANIDINE HCL 2 MG PO TABS: 2.0000 mg | ORAL_TABLET | Freq: Four times a day (QID) | ORAL | 1 refills | Status: DC | PRN

## 2021-06-25 MED ORDER — PRAVASTATIN SODIUM 80 MG PO TABS: 80.0000 mg | ORAL_TABLET | Freq: Every day | ORAL | 3 refills | Status: DC

## 2021-06-25 MED ORDER — ALBUTEROL SULFATE HFA 108 (90 BASE) MCG/ACT IN AERS: 2.0000 | INHALATION_SPRAY | Freq: Four times a day (QID) | RESPIRATORY_TRACT | 0 refills | Status: DC | PRN

## 2021-06-25 NOTE — Assessment & Plan Note (Signed)
BP Readings from Last 3 Encounters:  06/25/21 126/77  06/20/21 110/69  06/13/21 (!) 113/54   -well controlled

## 2021-06-25 NOTE — Progress Notes (Signed)
Established Patient Office Visit  Subjective:  Patient ID: Ernest Barnett, male    DOB: 05-02-1956  Age: 65 y.o. MRN: [id number]  CC:  Chief Complaint  Patient presents with   Annual Exam    CPE-complains of L hip pain intermittently, hurts when active, sometimes hurts to walk.    HPI Ernest Barnett presents for physical exam.  He is followed by nephrology for CKD-4 and oncology for multiple myeloma.  Past Medical History:  Diagnosis Date   Chronic kidney disease    COPD (chronic obstructive pulmonary disease) (HCC)    CVA (cerebral vascular accident) ([place]) 2013   Cystoid macular edema of left eye 03/07/2021   GERD (gastroesophageal reflux disease)    HOH (hard of hearing)    Hypercholesteremia    Hypertension    Left epiretinal membrane 03/07/2021   Pseudophakia 03/07/2021   Stroke ([place]) 03/24/2012   left sided weakness   Vitamin D deficiency     Past Surgical History:  Procedure Laterality Date   AV FISTULA PLACEMENT Right 02/08/2021   Procedure: RIGHT ARM ARTERIOVENOUS (AV) FISTULA CREATION;  Surgeon: [name], MD;  Location: [facility];  Service: Vascular;  Laterality: Right;   BIOPSY  11/12/2018   Procedure: BIOPSY;  Surgeon: Rogene Houston, MD;  Location: [facility];  Service: Endoscopy;;  colon   BIOPSY  05/10/2020   Procedure: BIOPSY;  Surgeon: Rogene Houston, MD;  Location: [facility];  Service: Endoscopy;;  esophagus   CATARACT EXTRACTION     right eye   CATARACT EXTRACTION W/PHACO  10/08/2012   Procedure: CATARACT EXTRACTION PHACO AND INTRAOCULAR LENS PLACEMENT ([place]);  Surgeon: Tonny Branch, MD;  Location: [facility];  Service: Ophthalmology;  Laterality: Left;  CDE:6.64   CHOLECYSTECTOMY     [place]   COLONOSCOPY N/A 11/12/2018   Procedure: COLONOSCOPY;  Surgeon: Rogene Houston, MD;  Location: [facility];  Service: Endoscopy;  Laterality: N/A;  1030   ELBOW FRACTURE SURGERY     left   ESOPHAGEAL DILATION N/A 11/12/2018   Procedure: ESOPHAGEAL DILATION;  Surgeon:  Rogene Houston, MD;  Location: [facility];  Service: Endoscopy;  Laterality: N/A;   ESOPHAGEAL DILATION N/A 05/10/2020   Procedure: ESOPHAGEAL DILATION;  Surgeon: Rogene Houston, MD;  Location: [facility];  Service: Endoscopy;  Laterality: N/A;   ESOPHAGOGASTRODUODENOSCOPY N/A 11/12/2018   Procedure: ESOPHAGOGASTRODUODENOSCOPY (EGD);  Surgeon: Rogene Houston, MD;  Location: [facility];  Service: Endoscopy;  Laterality: N/A;   ESOPHAGOGASTRODUODENOSCOPY N/A 05/10/2020   Procedure: ESOPHAGOGASTRODUODENOSCOPY (EGD);  Surgeon: Rogene Houston, MD;  Location[facility];  Service: Endoscopy;  Laterality: N/A;  210   HERNIA REPAIR     right inguinal   HYDROCELE EXCISION / REPAIR     POLYPECTOMY  11/12/2018   Procedure: POLYPECTOMY;  Surgeon: Rogene Houston, MD;  Location: [facility];  Service: Endoscopy;;  colon    RETINAL DETACHMENT SURGERY Left 2019   SPLENECTOMY, TOTAL     TONSILLECTOMY     VOCAL CORD INJECTION     removal of polyp-2005    History reviewed. No pertinent family history.  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  Tobacco Use   Smoking status: Former    Packs/day: 3.00    Years: 30.00    Pack years: 90.00    Types: Cigarettes   Smokeless tobacco: Current   Tobacco comments:    Currently vape  Vaping Use   Vaping Use: Every day   Substances: Nicotine  Substance and Sexual Activity   Alcohol use: Yes    Alcohol/week: 1.0 - 2.0 standard drink    Types: 1 - 2 Cans of beer per week    Comment: Occasionally   Drug use: No   Sexual activity: Not Currently    Birth control/protection: Abstinence, None  Other Topics Concern   Not on file  Social History Narrative   2 children, both nearby    Social Determinants of Health   Financial Resource Strain: Low Risk    Difficulty of Paying Living Expenses: Not hard at all  Food Insecurity: No Food Insecurity   Worried About Charity fundraiser in the Last Year: Never true   Arboriculturist in the Last Year: Never true  Transportation Needs: No Transportation Needs   Lack of Transportation (Medical): No   Lack of Transportation (Non-Medical): No  Physical Activity: Insufficiently Active   Days of Exercise per Week: 3 days   Minutes of Exercise per Session: 30 min  Stress: No Stress Concern Present   Feeling of Stress : Only a little  Social Connections: Socially Isolated   Frequency of Communication with Friends and Family: More than three times a week   Frequency of Social Gatherings with Friends and Family: More than three times a week   Attends Religious Services: Never   Marine scientist or Organizations: No   Attends Music therapist: Never   Marital Status: Divorced  Human resources officer Violence: Not At Risk   Fear of Current or Ex-Partner: No   Emotionally Abused: No   Physically Abused: No   Sexually Abused: No    Outpatient Medications Prior to Visit  Medication Sig Dispense Refill   amLODipine (NORVASC) 5 MG tablet Take 5 mg by mouth daily.     aspirin EC 81 MG tablet Take 1 tablet (81 mg total) by mouth daily. 30 tablet 11   bortezomib IV (VELCADE) 3.5 MG injection Inject 1.5 mg/m2 into the vein once a week.     budesonide-formoterol (SYMBICORT) 80-4.5 MCG/ACT inhaler Inhale 2 puffs into the lungs 2 (two) times daily as needed.     calcitRIOL (ROCALTROL) 0.25 MCG capsule Take 0.25 mcg by mouth every Monday, Wednesday, and Friday.     Calcium Carbonate-Vit D-Min (CALCIUM 1200 PO) Take 1-2 tablets by mouth daily.     cholecalciferol (VITAMIN D3) 25 MCG (1000 UNIT) tablet Take 1,000 Units by mouth daily.     CYCLOPHOSPHAMIDE IV Inject 300 mg/m2 into the vein once a week.     dexamethasone 40 mg  in sodium chloride 0.9 % 50 mL Inject 40 mg into the vein once a week.     furosemide (LASIX) 20 MG tablet Take 20 mg by mouth daily.     Multiple Vitamin (MULTIVITAMIN) tablet Take by mouth.     Omega-3 Fatty Acids (FISH OIL) 1000 MG CAPS Take 1,000 mg by mouth in the morning, at noon, and at bedtime.      pantoprazole (PROTONIX) 40 MG tablet Take 1 tablet (40 mg total) by mouth 2 (two) times daily. 180 tablet 3   polyethylene  glycol (MIRALAX / GLYCOLAX) packet Take 17 g by mouth daily. Patient states that he takes as needed. 30 each 5   sodium bicarbonate 650 MG tablet Take 650 mg by mouth 3 (three) times daily.     tamsulosin (FLOMAX) 0.4 MG CAPS capsule Take 0.4 mg by mouth daily.      pravastatin (PRAVACHOL) 40 MG tablet Take 80 mg by mouth daily.     Facility-Administered Medications Prior to Visit  Medication Dose Route Frequency Provider Last Rate Last Admin   palonosetron (ALOXI) 0.25 MG/5ML injection             No Known Allergies  ROS Review of Systems  Constitutional: Negative.        Weight gain since taking steroids  HENT: Negative.    Eyes: Negative.   Respiratory:  Positive for shortness of breath.   Cardiovascular: Negative.   Gastrointestinal: Negative.   Endocrine: Negative.   Genitourinary: Negative.   Musculoskeletal:  Positive for back pain.  Skin: Negative.   Allergic/Immunologic: Negative.   Neurological: Negative.   Hematological: Negative.   Psychiatric/Behavioral: Negative.       Objective:    Physical Exam Constitutional:      Appearance: Normal appearance.  HENT:     Head: Normocephalic and atraumatic.     Right Ear: Tympanic membrane, ear canal and external ear normal.     Left Ear: Tympanic membrane, ear canal and external ear normal.     Ears:     Comments: Left pupil constricted; right pupil irregularly shaped (he states he had surgery bilaterally and has poor left-eye vision)    Nose: Nose normal.     Mouth/Throat:     Mouth: Mucous  membranes are dry.     Pharynx: Oropharynx is clear.  Eyes:     Extraocular Movements: Extraocular movements intact.     Conjunctiva/sclera: Conjunctivae normal.     Pupils: Pupils are equal, round, and reactive to light.  Cardiovascular:     Rate and Rhythm: Normal rate and regular rhythm.     Pulses: Normal pulses.     Heart sounds: Normal heart sounds.  Pulmonary:     Effort: Pulmonary effort is normal.     Breath sounds: Wheezing present.  Abdominal:     General: Abdomen is flat. Bowel sounds are normal.     Palpations: Abdomen is soft.  Musculoskeletal:        General: Swelling present.     Cervical back: Normal range of motion and neck supple.     Comments: Left lower back pain; bilateral leg swelling  Skin:    General: Skin is warm.     Capillary Refill: Capillary refill takes less than 2 seconds.  Neurological:     General: No focal deficit present.     Mental Status: He is alert and oriented to person, place, and time.     Cranial Nerves: No cranial nerve deficit.     Sensory: No sensory deficit.     Motor: No weakness.     Coordination: Coordination normal.     Gait: Gait normal.  Psychiatric:        Mood and Affect: Mood normal.        Behavior: Behavior normal.        Thought Content: Thought content normal.        Judgment: Judgment normal.    BP 126/77 (BP Location: Left Arm, Patient Position: Sitting, Cuff Size: Large)   Pulse 93  Temp 98 F (36.7 C) (Oral)   Ht '5\' 7"'  (1.702 m)   Wt 247 lb (112 kg)   SpO2 94%   BMI 38.69 kg/m  Wt Readings from Last 3 Encounters:  06/25/21 247 lb (112 kg)  06/20/21 246 lb 9.6 oz (111.9 kg)  06/13/21 244 lb 14.4 oz (111.1 kg)     Health Maintenance Due  Topic Date Due   Pneumococcal Vaccine 107-63 Years old (1 - PCV) Never done    There are no preventive care reminders to display for this patient.  Lab Results  Component Value Date   TSH 1.200 06/20/2021   Lab Results  Component Value Date   WBC 6.8  06/20/2021   HGB 9.8 (L) 06/20/2021   HCT 29.6 (L) 06/20/2021   MCV 101.7 (H) 06/20/2021   PLT 182 06/20/2021   Lab Results  Component Value Date   NA 137 06/20/2021   K 4.3 06/20/2021   CO2 22 06/20/2021   GLUCOSE 104 (H) 06/20/2021   BUN 37 (H) 06/20/2021   CREATININE 3.26 (H) 06/20/2021   BILITOT 0.7 06/20/2021   ALKPHOS 31 (L) 06/20/2021   AST 17 06/20/2021   ALT 22 06/20/2021   PROT 7.3 06/20/2021   ALBUMIN 3.6 06/20/2021   CALCIUM 8.7 (L) 06/20/2021   ANIONGAP 5 06/20/2021   Lab Results  Component Value Date   CHOL 179 06/20/2021   Lab Results  Component Value Date   HDL 34 (L) 06/20/2021   Lab Results  Component Value Date   LDLCALC 106 (H) 06/20/2021   Lab Results  Component Value Date   TRIG 223 (H) 06/20/2021   No results found for: CHOLHDL No results found for: HGBA1C    Assessment & Plan:   Problem List Items Addressed This Visit       Cardiovascular and Mediastinum   Essential hypertension, benign    BP Readings from Last 3 Encounters:  06/25/21 126/77  06/20/21 110/69  06/13/21 (!) 113/54  -well controlled      Relevant Medications   pravastatin (PRAVACHOL) 80 MG tablet   Other Relevant Orders   CBC with Differential/Platelet   CMP14+EGFR   Lipid Panel With LDL/HDL Ratio     Respiratory   COPD (chronic obstructive pulmonary disease) ([place])    -states he uses symbicort 1 puff daily and sometimes BID -educated that symbicort is maintenance medication and should be used 2 puffs BID -Rx. albuterol      Relevant Medications   budesonide-formoterol (SYMBICORT) 80-4.5 MCG/ACT inhaler   albuterol (VENTOLIN HFA) 108 (90 Base) MCG/ACT inhaler     Other   Back pain    -taking tylenol -Rx. tizanidine      Relevant Medications   tiZANidine (ZANAFLEX) 2 MG tablet   Mixed hyperlipidemia    Lab Results  Component Value Date   CHOL 179 06/20/2021   HDL 34 (L) 06/20/2021   LDLCALC 106 (H) 06/20/2021   TRIG 223 (H) 06/20/2021   -INCREASE pravastatin      Relevant Medications   pravastatin (PRAVACHOL) 80 MG tablet   Other Relevant Orders   CBC with Differential/Platelet   CMP14+EGFR   Lipid Panel With LDL/HDL Ratio   Leg swelling    -has CKD-5, and is followed by nephrology -has some SOB and leg swelling -will refer to cardiology to r/o CHF      Relevant Orders   Ambulatory referral to Cardiology   Other Visit Diagnoses     Encounter for general adult  medical examination with abnormal findings    -  Primary       Meds ordered this encounter  Medications   albuterol (VENTOLIN HFA) 108 (90 Base) MCG/ACT inhaler    Sig: Inhale 2 puffs into the lungs every 6 (six) hours as needed for wheezing or shortness of breath.    Dispense:  8 g    Refill:  0   tiZANidine (ZANAFLEX) 2 MG tablet    Sig: Take 1 tablet (2 mg total) by mouth every 6 (six) hours as needed for muscle spasms.    Dispense:  30 tablet    Refill:  1   pravastatin (PRAVACHOL) 80 MG tablet    Sig: Take 1 tablet (80 mg total) by mouth daily.    Dispense:  90 tablet    Refill:  3    Follow-up: Return in about 3 months (around 09/25/2021) for Lab follow-up (HLD).    Noreene Larsson, NP

## 2021-06-25 NOTE — Assessment & Plan Note (Signed)
Lab Results  Component Value Date   CHOL 179 06/20/2021   HDL 34 (L) 06/20/2021   LDLCALC 106 (H) 06/20/2021   TRIG 223 (H) 06/20/2021   -INCREASE pravastatin

## 2021-06-25 NOTE — Assessment & Plan Note (Signed)
-  has CKD-5, and is followed by nephrology -has some SOB and leg swelling -will refer to cardiology to r/o CHF

## 2021-06-25 NOTE — Patient Instructions (Signed)
Please have fasting labs drawn 2-3 days prior to your appointment so we can discuss the results during your office visit.  

## 2021-06-25 NOTE — Assessment & Plan Note (Signed)
-  taking tylenol -Rx. tizanidine

## 2021-06-25 NOTE — Assessment & Plan Note (Signed)
-  states he uses symbicort 1 puff daily and sometimes BID -educated that symbicort is maintenance medication and should be used 2 puffs BID -Rx. albuterol

## 2021-06-27 ENCOUNTER — Inpatient Hospital Stay (HOSPITAL_COMMUNITY): Payer: Medicare HMO

## 2021-06-27 ENCOUNTER — Encounter (HOSPITAL_COMMUNITY): Payer: Self-pay

## 2021-06-27 ENCOUNTER — Other Ambulatory Visit: Payer: Self-pay

## 2021-06-27 VITALS — BP 125/75 | HR 85 | Temp 97.0°F | Resp 18 | Wt 247.0 lb

## 2021-06-27 DIAGNOSIS — C9000 Multiple myeloma not having achieved remission: Secondary | ICD-10-CM | POA: Diagnosis not present

## 2021-06-27 DIAGNOSIS — Z5112 Encounter for antineoplastic immunotherapy: Secondary | ICD-10-CM | POA: Diagnosis not present

## 2021-06-27 DIAGNOSIS — Z5111 Encounter for antineoplastic chemotherapy: Secondary | ICD-10-CM | POA: Diagnosis not present

## 2021-06-27 LAB — CBC WITH DIFFERENTIAL/PLATELET
Abs Immature Granulocytes: 0.06 10*3/uL (ref 0.00–0.07)
Basophils Absolute: 0.1 10*3/uL (ref 0.0–0.1)
Basophils Relative: 1 %
Eosinophils Absolute: 0.4 10*3/uL (ref 0.0–0.5)
Eosinophils Relative: 5 %
HCT: 30.6 % — ABNORMAL LOW (ref 39.0–52.0)
Hemoglobin: 10.2 g/dL — ABNORMAL LOW (ref 13.0–17.0)
Immature Granulocytes: 1 %
Lymphocytes Relative: 30 %
Lymphs Abs: 2 10*3/uL (ref 0.7–4.0)
MCH: 34.3 pg — ABNORMAL HIGH (ref 26.0–34.0)
MCHC: 33.3 g/dL (ref 30.0–36.0)
MCV: 103 fL — ABNORMAL HIGH (ref 80.0–100.0)
Monocytes Absolute: 1.2 10*3/uL — ABNORMAL HIGH (ref 0.1–1.0)
Monocytes Relative: 19 %
Neutro Abs: 2.9 10*3/uL (ref 1.7–7.7)
Neutrophils Relative %: 44 %
Platelets: 175 10*3/uL (ref 150–400)
RBC: 2.97 MIL/uL — ABNORMAL LOW (ref 4.22–5.81)
RDW: 15.8 % — ABNORMAL HIGH (ref 11.5–15.5)
WBC: 6.6 10*3/uL (ref 4.0–10.5)
nRBC: 4.3 % — ABNORMAL HIGH (ref 0.0–0.2)

## 2021-06-27 LAB — COMPREHENSIVE METABOLIC PANEL
ALT: 25 U/L (ref 0–44)
AST: 19 U/L (ref 15–41)
Albumin: 3.6 g/dL (ref 3.5–5.0)
Alkaline Phosphatase: 31 U/L — ABNORMAL LOW (ref 38–126)
Anion gap: 6 (ref 5–15)
BUN: 32 mg/dL — ABNORMAL HIGH (ref 8–23)
CO2: 22 mmol/L (ref 22–32)
Calcium: 8.4 mg/dL — ABNORMAL LOW (ref 8.9–10.3)
Chloride: 108 mmol/L (ref 98–111)
Creatinine, Ser: 3.2 mg/dL — ABNORMAL HIGH (ref 0.61–1.24)
GFR, Estimated: 21 mL/min — ABNORMAL LOW (ref >60)
Glucose, Bld: 109 mg/dL — ABNORMAL HIGH (ref 70–99)
Potassium: 4.1 mmol/L (ref 3.5–5.1)
Sodium: 136 mmol/L (ref 135–145)
Total Bilirubin: 0.7 mg/dL (ref 0.3–1.2)
Total Protein: 7.4 g/dL (ref 6.5–8.1)

## 2021-06-27 LAB — MAGNESIUM: Magnesium: 2.1 mg/dL (ref 1.7–2.4)

## 2021-06-27 MED ORDER — DENOSUMAB 120 MG/1.7ML ~~LOC~~ SOLN
120.0000 mg | Freq: Once | SUBCUTANEOUS | Status: AC
  Administered 2021-06-27: 120 mg via SUBCUTANEOUS
  Filled 2021-06-27: qty 1.7

## 2021-06-27 MED ORDER — PALONOSETRON HCL INJECTION 0.25 MG/5ML
0.2500 mg | Freq: Once | INTRAVENOUS | Status: AC
  Administered 2021-06-27: 0.25 mg via INTRAVENOUS
  Filled 2021-06-27: qty 5

## 2021-06-27 MED ORDER — SODIUM CHLORIDE 0.9 % IV SOLN
300.0000 mg/m2 | Freq: Once | INTRAVENOUS | Status: AC
  Administered 2021-06-27: 680 mg via INTRAVENOUS
  Filled 2021-06-27: qty 34

## 2021-06-27 MED ORDER — SODIUM CHLORIDE 0.9 % IV SOLN: Freq: Once | INTRAVENOUS | Status: AC

## 2021-06-27 MED ORDER — SODIUM CHLORIDE 0.9 % IV SOLN
20.0000 mg | Freq: Once | INTRAVENOUS | Status: AC
  Administered 2021-06-27: 20 mg via INTRAVENOUS
  Filled 2021-06-27: qty 20

## 2021-06-27 MED ORDER — BORTEZOMIB CHEMO SQ INJECTION 3.5 MG (2.5MG/ML)
1.5000 mg/m2 | Freq: Once | INTRAMUSCULAR | Status: AC
  Administered 2021-06-27: 3.5 mg via SUBCUTANEOUS
  Filled 2021-06-27: qty 1.4

## 2021-06-27 NOTE — Patient Instructions (Signed)
Ernest Barnett  Discharge Instructions: Thank you for choosing [facility] to provide your oncology and hematology care.  If you have a lab appointment with the [facility], please come in thru the Main Entrance and check in at the main information desk.  Wear comfortable clothing and clothing appropriate for easy access to any Portacath or PICC line.   We strive to give you quality time with your provider. You may need to reschedule your appointment if you arrive late (15 or more minutes).  Arriving late affects you and other patients whose appointments are after yours.  Also, if you miss three or more appointments without notifying the office, you may be dismissed from the clinic at the provider's discretion.      For prescription refill requests, have your pharmacy contact our office and allow 72 hours for refills to be completed.    Today you received the following chemotherapy and/or immunotherapy agents Velcade/ Cytoxan.       To help prevent nausea and vomiting after your treatment, we encourage you to take your nausea medication as directed.  BELOW ARE SYMPTOMS THAT SHOULD BE REPORTED IMMEDIATELY: *FEVER GREATER THAN 100.4 F (38 C) OR HIGHER *CHILLS OR SWEATING *NAUSEA AND VOMITING THAT IS NOT CONTROLLED WITH YOUR NAUSEA MEDICATION *UNUSUAL SHORTNESS OF BREATH *UNUSUAL BRUISING OR BLEEDING *URINARY PROBLEMS (pain or burning when urinating, or frequent urination) *BOWEL PROBLEMS (unusual diarrhea, constipation, pain near the anus) TENDERNESS IN MOUTH AND THROAT WITH OR WITHOUT PRESENCE OF ULCERS (sore throat, sores in mouth, or a toothache) UNUSUAL RASH, SWELLING OR PAIN  UNUSUAL VAGINAL DISCHARGE OR ITCHING   Items with * indicate a potential emergency and should be followed up as soon as possible or go to the Emergency Department if any problems should occur.  Please show the CHEMOTHERAPY ALERT CARD or IMMUNOTHERAPY ALERT CARD at check-in to the  Emergency Department and triage nurse.  Should you have questions after your visit or need to cancel or reschedule your appointment, please contact [facility] [phone]  and follow the prompts.  Office hours are 8:00 a.m. to 4:30 p.m. Monday - Friday. Please note that voicemails left after 4:00 p.m. may not be returned until the following business day.  We are closed weekends and major holidays. You have access to a nurse at all times for urgent questions. Please call the main number to the clinic [phone] and follow the prompts.  For any non-urgent questions, you may also contact your provider using MyChart. We now offer e-Visits for anyone 76 and older to request care online for non-urgent symptoms. For details visit [email or web address].   Also download the MyChart app! Go to the app store, search "MyChart", open the app, select [facility], and log in with your MyChart username and password.  Due to Covid, a mask is required upon entering the hospital/clinic. If you do not have a mask, one will be given to you upon arrival. For doctor visits, patients may have 1 support person aged 34 or older with them. For treatment visits, patients cannot have anyone with them due to current Covid guidelines and our immunocompromised population.

## 2021-06-27 NOTE — Progress Notes (Signed)
Patient presents today for treatment. Labs within parameters for treatment. Per Note in tx plan, MD aware of elevated serum creatinine. MAR reviewed and updated. Heart rate elevated on arrival. Heart rate rechecked at 96. Creatinine 3.20. Patient has chronic kidney disease. Patient denies any significant changes since his last treatment.   Treatment given today per MD orders. Tolerated infusion without adverse affects. Vital signs stable. No complaints at this time. Discharged from clinic ambulatory in stable condition. Alert and oriented x 3. F/U with [facility] as scheduled.

## 2021-07-04 ENCOUNTER — Inpatient Hospital Stay (HOSPITAL_COMMUNITY): Payer: Medicare HMO

## 2021-07-04 ENCOUNTER — Other Ambulatory Visit: Payer: Self-pay

## 2021-07-04 VITALS — BP 116/75 | HR 89 | Temp 96.9°F | Resp 18 | Wt 246.8 lb

## 2021-07-04 DIAGNOSIS — C9000 Multiple myeloma not having achieved remission: Secondary | ICD-10-CM | POA: Diagnosis not present

## 2021-07-04 DIAGNOSIS — Z5112 Encounter for antineoplastic immunotherapy: Secondary | ICD-10-CM | POA: Diagnosis not present

## 2021-07-04 DIAGNOSIS — Z5111 Encounter for antineoplastic chemotherapy: Secondary | ICD-10-CM | POA: Diagnosis not present

## 2021-07-04 LAB — CBC WITH DIFFERENTIAL/PLATELET
Basophils Absolute: 0.1 10*3/uL (ref 0.0–0.1)
Basophils Relative: 2 %
Eosinophils Absolute: 0.3 10*3/uL (ref 0.0–0.5)
Eosinophils Relative: 4 %
HCT: 29.7 % — ABNORMAL LOW (ref 39.0–52.0)
Hemoglobin: 10 g/dL — ABNORMAL LOW (ref 13.0–17.0)
Lymphocytes Relative: 25 %
Lymphs Abs: 1.6 10*3/uL (ref 0.7–4.0)
MCH: 34.7 pg — ABNORMAL HIGH (ref 26.0–34.0)
MCHC: 33.7 g/dL (ref 30.0–36.0)
MCV: 103.1 fL — ABNORMAL HIGH (ref 80.0–100.0)
Monocytes Absolute: 0.7 10*3/uL (ref 0.1–1.0)
Monocytes Relative: 10 %
Myelocytes: 3 %
Neutro Abs: 3.6 10*3/uL (ref 1.7–7.7)
Neutrophils Relative %: 56 %
Platelets: 168 10*3/uL (ref 150–400)
RBC: 2.88 MIL/uL — ABNORMAL LOW (ref 4.22–5.81)
RDW: 14.8 % (ref 11.5–15.5)
WBC: 6.5 10*3/uL (ref 4.0–10.5)
nRBC: 3.4 % — ABNORMAL HIGH (ref 0.0–0.2)
nRBC: 5 /100 WBC — ABNORMAL HIGH

## 2021-07-04 LAB — COMPREHENSIVE METABOLIC PANEL
ALT: 21 U/L (ref 0–44)
AST: 17 U/L (ref 15–41)
Albumin: 3.8 g/dL (ref 3.5–5.0)
Alkaline Phosphatase: 31 U/L — ABNORMAL LOW (ref 38–126)
Anion gap: 8 (ref 5–15)
BUN: 42 mg/dL — ABNORMAL HIGH (ref 8–23)
CO2: 21 mmol/L — ABNORMAL LOW (ref 22–32)
Calcium: 8.7 mg/dL — ABNORMAL LOW (ref 8.9–10.3)
Chloride: 106 mmol/L (ref 98–111)
Creatinine, Ser: 3.75 mg/dL — ABNORMAL HIGH (ref 0.61–1.24)
GFR, Estimated: 17 mL/min — ABNORMAL LOW (ref >60)
Glucose, Bld: 118 mg/dL — ABNORMAL HIGH (ref 70–99)
Potassium: 4.4 mmol/L (ref 3.5–5.1)
Sodium: 135 mmol/L (ref 135–145)
Total Bilirubin: 0.5 mg/dL (ref 0.3–1.2)
Total Protein: 7.5 g/dL (ref 6.5–8.1)

## 2021-07-04 LAB — MAGNESIUM: Magnesium: 2.2 mg/dL (ref 1.7–2.4)

## 2021-07-04 LAB — LACTATE DEHYDROGENASE: LDH: 217 U/L — ABNORMAL HIGH (ref 98–192)

## 2021-07-04 MED ORDER — BORTEZOMIB CHEMO SQ INJECTION 3.5 MG (2.5MG/ML)
1.5000 mg/m2 | Freq: Once | INTRAMUSCULAR | Status: AC
  Administered 2021-07-04: 3.5 mg via SUBCUTANEOUS
  Filled 2021-07-04: qty 1.4

## 2021-07-04 MED ORDER — PALONOSETRON HCL INJECTION 0.25 MG/5ML
0.2500 mg | Freq: Once | INTRAVENOUS | Status: AC
  Administered 2021-07-04: 0.25 mg via INTRAVENOUS
  Filled 2021-07-04: qty 5

## 2021-07-04 MED ORDER — SODIUM CHLORIDE 0.9 % IV SOLN: Freq: Once | INTRAVENOUS | Status: AC

## 2021-07-04 MED ORDER — SODIUM CHLORIDE 0.9 % IV SOLN
20.0000 mg | Freq: Once | INTRAVENOUS | Status: AC
  Administered 2021-07-04: 20 mg via INTRAVENOUS
  Filled 2021-07-04: qty 20

## 2021-07-04 MED ORDER — SODIUM CHLORIDE 0.9 % IV SOLN
300.0000 mg/m2 | Freq: Once | INTRAVENOUS | Status: AC
  Administered 2021-07-04: 680 mg via INTRAVENOUS
  Filled 2021-07-04: qty 34

## 2021-07-04 NOTE — Progress Notes (Signed)
Patient present today for Cytoxan and Velcade per providers order.  Vital signs and Labs within parameters for treatment.  Patient has no new complaints at this time.   Peripheral IV started and blood return noted pre and post infusion.  Velcade administration without incident; injection site WNL; see MAR for injection details.  Patient tolerated procedure well and without incident.  No questions or complaints noted at this time.   Cytoxan and Velcade given today per MD orders.  Stable during injection and infusion without adverse affects.  Vital signs stable.  No complaints at this time.  Discharge from clinic ambulatory in stable condition.  Alert and oriented X 3.  Follow up with [facility] as scheduled.

## 2021-07-04 NOTE — Patient Instructions (Signed)
[facility]  Discharge Instructions: Thank you for choosing [facility] to provide your oncology and hematology care.  If you have a lab appointment with the [facility], please come in thru the Main Entrance and check in at the main information desk.  Wear comfortable clothing and clothing appropriate for easy access to any Portacath or PICC line.   We strive to give you quality time with your provider. You may need to reschedule your appointment if you arrive late (15 or more minutes).  Arriving late affects you and other patients whose appointments are after yours.  Also, if you miss three or more appointments without notifying the office, you may be dismissed from the clinic at the provider's discretion.      For prescription refill requests, have your pharmacy contact our office and allow 72 hours for refills to be completed.    Today you received the following chemotherapy and/or immunotherapy agents Velcade Cytoxan      To help prevent nausea and vomiting after your treatment, we encourage you to take your nausea medication as directed.  BELOW ARE SYMPTOMS THAT SHOULD BE REPORTED IMMEDIATELY: *FEVER GREATER THAN 100.4 F (38 C) OR HIGHER *CHILLS OR SWEATING *NAUSEA AND VOMITING THAT IS NOT CONTROLLED WITH YOUR NAUSEA MEDICATION *UNUSUAL SHORTNESS OF BREATH *UNUSUAL BRUISING OR BLEEDING *URINARY PROBLEMS (pain or burning when urinating, or frequent urination) *BOWEL PROBLEMS (unusual diarrhea, constipation, pain near the anus) TENDERNESS IN MOUTH AND THROAT WITH OR WITHOUT PRESENCE OF ULCERS (sore throat, sores in mouth, or a toothache) UNUSUAL RASH, SWELLING OR PAIN  UNUSUAL VAGINAL DISCHARGE OR ITCHING   Items with * indicate a potential emergency and should be followed up as soon as possible or go to the Emergency Department if any problems should occur.  Please show the CHEMOTHERAPY ALERT CARD or IMMUNOTHERAPY ALERT CARD at check-in to the Emergency  Department and triage nurse.  Should you have questions after your visit or need to cancel or reschedule your appointment, please contact [facility] [phone]  and follow the prompts.  Office hours are 8:00 a.m. to 4:30 p.m. Monday - Friday. Please note that voicemails left after 4:00 p.m. may not be returned until the following business day.  We are closed weekends and major holidays. You have access to a nurse at all times for urgent questions. Please call the main number to the clinic [phone] and follow the prompts.  For any non-urgent questions, you may also contact your provider using MyChart. We now offer e-Visits for anyone 83 and older to request care online for non-urgent symptoms. For details visit [email or web address].   Also download the MyChart app! Go to the app store, search "MyChart", open the app, select [facility], and log in with your MyChart username and password.  Due to Covid, a mask is required upon entering the hospital/clinic. If you do not have a mask, one will be given to you upon arrival. For doctor visits, patients may have 1 support person aged 10 or older with them. For treatment visits, patients cannot have anyone with them due to current Covid guidelines and our immunocompromised population.

## 2021-07-05 ENCOUNTER — Encounter: Payer: Self-pay | Admitting: Cardiology

## 2021-07-05 ENCOUNTER — Ambulatory Visit: Payer: Medicare HMO | Admitting: Cardiology

## 2021-07-05 VITALS — BP 116/74 | HR 98 | Wt 248.0 lb

## 2021-07-05 DIAGNOSIS — R0600 Dyspnea, unspecified: Secondary | ICD-10-CM

## 2021-07-05 DIAGNOSIS — R0602 Shortness of breath: Secondary | ICD-10-CM | POA: Diagnosis not present

## 2021-07-05 DIAGNOSIS — R0609 Other forms of dyspnea: Secondary | ICD-10-CM

## 2021-07-05 LAB — KAPPA/LAMBDA LIGHT CHAINS
Kappa free light chain: 207.4 mg/L — ABNORMAL HIGH (ref 3.3–19.4)
Kappa, lambda light chain ratio: 20.33 — ABNORMAL HIGH (ref 0.26–1.65)
Lambda free light chains: 10.2 mg/L (ref 5.7–26.3)

## 2021-07-05 NOTE — Patient Instructions (Signed)
Medication Instructions:  Your physician recommends that you continue on your current medications as directed. Please refer to the Current Medication list given to you today.  *If you need a refill on your cardiac medications before your next appointment, please call your pharmacy*   Lab Work: NONE   If you have labs (blood work) drawn today and your tests are completely normal, you will receive your results only by: [facility] (if you have MyChart) OR A paper copy in the mail If you have any lab test that is abnormal or we need to change your treatment, we will call you to review the results.   Testing/Procedures: Your physician has requested that you have an echocardiogram. Echocardiography is a painless test that uses sound waves to create images of your heart. It provides your doctor with information about the size and shape of your heart and how well your heart's chambers and valves are working. This procedure takes approximately one hour. There are no restrictions for this procedure.    Follow-Up: At [facility], you and your health needs are our priority.  As part of our continuing mission to provide you with exceptional heart care, we have created designated Provider Care Teams.  These Care Teams include your primary Cardiologist (physician) and Advanced Practice Providers (APPs -  Physician Assistants and Nurse Practitioners) who all work together to provide you with the care you need, when you need it.  We recommend signing up for the patient portal called "MyChart".  Sign up information is provided on this After Visit Summary.  MyChart is used to connect with patients for Virtual Visits (Telemedicine).  Patients are able to view lab/test results, encounter notes, upcoming appointments, etc.  Non-urgent messages can be sent to your provider as well.   To learn more about what you can do with MyChart, go to [email or web address].    Your next appointment:   6  week(s)  The format for your next appointment:   In Person  Provider:   Bernerd Pho, PA-C or Ermalinda Barrios, PA-C   Other Instructions Thank you for choosing [facility]!

## 2021-07-05 NOTE — Progress Notes (Signed)
Clinical Summary Ernest Barnett is a 65 y.o.male seen today as a new consult, referred by NP Pearline Cables for the following medical problems.   SOB/LE edema - 05/2021 renal stopped norvasc, started lasix - ongoing SOB that has progressed. Doing light yardowrk, walking from parking lot into office.  -some recent LE edema.  - no chest pain. No orthopnea  2. CKD V - followed by Dr [name]   3. Prior CVA  4. COPD - compliant with inhalers.   Past Medical History:  Diagnosis Date   Chronic kidney disease    COPD (chronic obstructive pulmonary disease) ([place])    CVA (cerebral vascular accident) ([place]) 2013   Cystoid macular edema of left eye 03/07/2021   GERD (gastroesophageal reflux disease)    HOH (hard of hearing)    Hypercholesteremia    Hypertension    Left epiretinal membrane 03/07/2021   Pseudophakia 03/07/2021   Stroke ([place]) 03/24/2012   left sided weakness   Vitamin D deficiency      No Known Allergies   Current Outpatient Medications  Medication Sig Dispense Refill   albuterol (VENTOLIN HFA) 108 (90 Base) MCG/ACT inhaler Inhale 2 puffs into the lungs every 6 (six) hours as needed for wheezing or shortness of breath. 8 g 0   amLODipine (NORVASC) 5 MG tablet Take 5 mg by mouth daily.     aspirin EC 81 MG tablet Take 1 tablet (81 mg total) by mouth daily. 30 tablet 11   bortezomib IV (VELCADE) 3.5 MG injection Inject 1.5 mg/m2 into the vein once a week.     budesonide-formoterol (SYMBICORT) 80-4.5 MCG/ACT inhaler Inhale 2 puffs into the lungs 2 (two) times daily as needed.     calcitRIOL (ROCALTROL) 0.25 MCG capsule Take 0.25 mcg by mouth every Monday, Wednesday, and Friday.     Calcium Carbonate-Vit D-Min (CALCIUM 1200 PO) Take 1-2 tablets by mouth daily.     cholecalciferol (VITAMIN D3) 25 MCG (1000 UNIT) tablet Take 1,000 Units by mouth daily.     CYCLOPHOSPHAMIDE IV Inject 300 mg/m2 into the vein once a week.     dexamethasone 40 mg in sodium chloride 0.9 % 50 mL Inject 40 mg  into the vein once a week.     furosemide (LASIX) 20 MG tablet Take 20 mg by mouth daily.     Multiple Vitamin (MULTIVITAMIN) tablet Take by mouth.     Omega-3 Fatty Acids (FISH OIL) 1000 MG CAPS Take 1,000 mg by mouth in the morning, at noon, and at bedtime.      pantoprazole (PROTONIX) 40 MG tablet Take 1 tablet (40 mg total) by mouth 2 (two) times daily. 180 tablet 3   polyethylene glycol (MIRALAX / GLYCOLAX) packet Take 17 g by mouth daily. Patient states that he takes as needed. 30 each 5   pravastatin (PRAVACHOL) 80 MG tablet Take 1 tablet (80 mg total) by mouth daily. 90 tablet 3   sodium bicarbonate 650 MG tablet Take 650 mg by mouth 3 (three) times daily.     tamsulosin (FLOMAX) 0.4 MG CAPS capsule Take 0.4 mg by mouth daily.      tiZANidine (ZANAFLEX) 2 MG tablet Take 1 tablet (2 mg total) by mouth every 6 (six) hours as needed for muscle spasms. 30 tablet 1   No current facility-administered medications for this visit.   Facility-Administered Medications Ordered in Other Visits  Medication Dose Route Frequency Provider Last Rate Last Admin   palonosetron (ALOXI) 0.25 MG/5ML injection  Past Surgical History:  Procedure Laterality Date   AV FISTULA PLACEMENT Right 02/08/2021   Procedure: RIGHT ARM ARTERIOVENOUS (AV) FISTULA CREATION;  Surgeon: [name], MD;  Location: [facility];  Service: Vascular;  Laterality: Right;   BIOPSY  11/12/2018   Procedure: BIOPSY;  Surgeon: Rogene Houston, MD;  Location: [facility];  Service: Endoscopy;;  colon   BIOPSY  05/10/2020   Procedure: BIOPSY;  Surgeon: Rogene Houston, MD;  Location: [facility];  Service: Endoscopy;;  esophagus   CATARACT EXTRACTION     right eye   CATARACT EXTRACTION W/PHACO  10/08/2012   Procedure: CATARACT EXTRACTION PHACO AND INTRAOCULAR LENS PLACEMENT ([place]);  Surgeon: Tonny , MD;  Location: [facility];  Service: Ophthalmology;  Laterality: Left;  CDE:6.64   CHOLECYSTECTOMY     [place]   COLONOSCOPY N/A  11/12/2018   Procedure: COLONOSCOPY;  Surgeon: Rogene Houston, MD;  Location: [facility];  Service: Endoscopy;  Laterality: N/A;  1030   ELBOW FRACTURE SURGERY     left   ESOPHAGEAL DILATION N/A 11/12/2018   Procedure: ESOPHAGEAL DILATION;  Surgeon: Rogene Houston, MD;  [facility]: [facility];  Service: Endoscopy;  Laterality: N/A;   ESOPHAGEAL DILATION N/A 05/10/2020   Procedure: ESOPHAGEAL DILATION;  Surgeon: Rogene Houston, MD;  [facility];  Service: Endoscopy;  Laterality: N/A;   ESOPHAGOGASTRODUODENOSCOPY N/A 11/12/2018   Procedure: ESOPHAGOGASTRODUODENOSCOPY (EGD);  Surgeon: Rogene Houston, MD;  [facility];  Service: Endoscopy;  Laterality: N/A;   ESOPHAGOGASTRODUODENOSCOPY N/A 05/10/2020   Procedure: ESOPHAGOGASTRODUODENOSCOPY (EGD);  Surgeon: Rogene Houston, MD;  [facility];  Service: Endoscopy;  Laterality: N/A;  210   HERNIA REPAIR     right inguinal   HYDROCELE EXCISION / REPAIR     POLYPECTOMY  11/12/2018   Procedure: POLYPECTOMY;  Surgeon: Rogene Houston, MD;  [facility];  Service: Endoscopy;;  colon    RETINAL DETACHMENT SURGERY Left 2019   SPLENECTOMY, TOTAL     TONSILLECTOMY     VOCAL CORD INJECTION     removal of polyp-2005     No Known Allergies    No family history on file.   Social History Ernest Barnett reports that he has quit smoking. His smoking use included cigarettes. He has a 90.00 pack-year smoking history. He uses smokeless tobacco. Ernest Barnett reports current alcohol use of about 1.0 - 2.0 standard drink per week.   Review of Systems CONSTITUTIONAL: No weight loss, fever, chills, weakness or fatigue.  HEENT: Eyes: No visual loss, blurred vision, double vision or yellow sclerae.No hearing loss, sneezing, congestion, runny nose or sore throat.  SKIN: No rash or itching.  CARDIOVASCULAR: per hpi RESPIRATORY: per hpi GASTROINTESTINAL: No anorexia, nausea, vomiting or diarrhea. No abdominal pain or  blood.  GENITOURINARY: No burning on urination, no polyuria NEUROLOGICAL: No headache, dizziness, syncope, paralysis, ataxia, numbness or tingling in the extremities. No change in bowel or bladder control.  MUSCULOSKELETAL: No muscle, back pain, joint pain or stiffness.  LYMPHATICS: No enlarged nodes. No history of splenectomy.  PSYCHIATRIC: No history of depression or anxiety.  ENDOCRINOLOGIC: No reports of sweating, cold or heat intolerance. No polyuria or polydipsia.  [name]   Physical Examination Today's Vitals   07/05/21 0906  BP: 116/74  Pulse: 98  SpO2: 96%  Weight: 248 lb (112.5 kg)   Body mass index is 38.84 kg/m.  Gen: resting comfortably, no acute distress HEENT: no  scleral icterus, pupils equal round and reactive, no palptable cervical adenopathy,  CV: RRR, no m/r/g no jvd Resp: Clear to auscultation bilaterally GI: abdomen is soft, non-tender, non-distended, normal bowel sounds, no hepatosplenomegaly MSK: extremities are warm, 1+ bilateral LE edema Skin: warm, no rash Neuro:  no focal deficits Psych: appropriate affect    Assessment and Plan  1.SOB/DOE/LE edema - will obtain echo to evaluate for any underlying structural heart disease - with advanced CKD will defer diuretic dosing to neprhology - if benign echo, may consider lexiscan      Arnoldo Lenis, M.D.

## 2021-07-06 LAB — IMMUNOFIXATION ELECTROPHORESIS
IgA: 25 mg/dL — ABNORMAL LOW (ref 61–437)
IgG (Immunoglobin G), Serum: 1449 mg/dL (ref 603–1613)
IgM (Immunoglobulin M), Srm: 10 mg/dL — ABNORMAL LOW (ref 20–172)
Total Protein ELP: 6.9 g/dL (ref 6.0–8.5)

## 2021-07-10 ENCOUNTER — Ambulatory Visit (HOSPITAL_COMMUNITY)
Admission: RE | Admit: 2021-07-10 | Discharge: 2021-07-10 | Disposition: A | Discharge status: Home / Self Care | Payer: Medicare HMO | Source: Ambulatory Visit | Attending: Cardiology | Admitting: Cardiology

## 2021-07-10 ENCOUNTER — Other Ambulatory Visit: Payer: Self-pay

## 2021-07-10 DIAGNOSIS — R0602 Shortness of breath: Secondary | ICD-10-CM | POA: Insufficient documentation

## 2021-07-10 DIAGNOSIS — N189 Chronic kidney disease, unspecified: Secondary | ICD-10-CM | POA: Diagnosis not present

## 2021-07-10 DIAGNOSIS — E211 Secondary hyperparathyroidism, not elsewhere classified: Secondary | ICD-10-CM | POA: Diagnosis not present

## 2021-07-10 DIAGNOSIS — N08 Glomerular disorders in diseases classified elsewhere: Secondary | ICD-10-CM | POA: Diagnosis not present

## 2021-07-10 DIAGNOSIS — N185 Chronic kidney disease, stage 5: Secondary | ICD-10-CM | POA: Diagnosis not present

## 2021-07-10 DIAGNOSIS — I120 Hypertensive chronic kidney disease with stage 5 chronic kidney disease or end stage renal disease: Secondary | ICD-10-CM | POA: Diagnosis not present

## 2021-07-10 DIAGNOSIS — E872 Acidosis: Secondary | ICD-10-CM | POA: Diagnosis not present

## 2021-07-10 DIAGNOSIS — R809 Proteinuria, unspecified: Secondary | ICD-10-CM | POA: Diagnosis not present

## 2021-07-10 DIAGNOSIS — D631 Anemia in chronic kidney disease: Secondary | ICD-10-CM | POA: Diagnosis not present

## 2021-07-10 DIAGNOSIS — C9000 Multiple myeloma not having achieved remission: Secondary | ICD-10-CM | POA: Diagnosis not present

## 2021-07-10 LAB — PROTEIN ELECTROPHORESIS, SERUM
A/G Ratio: 1.2 (ref 0.7–1.7)
Albumin ELP: 3.7 g/dL (ref 2.9–4.4)
Alpha-1-Globulin: 0.2 g/dL (ref 0.0–0.4)
Alpha-2-Globulin: 0.7 g/dL (ref 0.4–1.0)
Beta Globulin: 0.8 g/dL (ref 0.7–1.3)
Gamma Globulin: 1.4 g/dL (ref 0.4–1.8)
Globulin, Total: 3.2 g/dL (ref 2.2–3.9)
M-Spike, %: 1.1 g/dL — ABNORMAL HIGH
Total Protein ELP: 6.9 g/dL (ref 6.0–8.5)

## 2021-07-10 LAB — ECHOCARDIOGRAM COMPLETE
Area-P 1/2: 4.33 cm2
S' Lateral: 3 cm

## 2021-07-10 NOTE — Progress Notes (Signed)
[facility] [address], [place] [address]   CLINIC:  Medical Oncology/Hematology  PCP:  Ernest Roberts, NP [address]  [address] / [place] [address] [phone]   REASON FOR VISIT:  Follow-up for MGUS  PRIOR THERAPY: none  NGS Results: not done  CURRENT THERAPY: CyBorD weekly  BRIEF ONCOLOGIC HISTORY:  Oncology History  Multiple myeloma (HCC)  03/06/2021 Initial Diagnosis   Multiple myeloma (HCC)   03/06/2021 Cancer Staging   Staging form: Plasma Cell Myeloma and Plasma Cell Disorders, AJCC 8th Edition - Clinical stage from 03/06/2021: RISS Stage II (Beta-2-microglobulin (mg/L): 5.5, Albumin (g/dL): 3.5, ISS: Stage III, High-risk cytogenetics: Absent, LDH: Normal) - Signed by Doreatha Massed, MD on 03/06/2021 Stage prefix: Initial diagnosis Beta 2 microglobulin range (mg/L): Greater than or equal to 5.5 Albumin range (g/dL): Greater than or equal to 3.5 Cytogenetics: No abnormalities Pretreatment monoclonal protein level in serum (M spike) (g/dL): 2 Pretreatment serum free kappa light chain level (g/L): 794 Pretreatment serum free lambda light chain level (g/L): 15.9   03/14/2021 -  Chemotherapy    Patient is on Treatment Plan: MULTIPLE MYELOMA CYBORD - WEEKLY BORTEZOMIB         CANCER STAGING: Cancer Staging Multiple myeloma (HCC) Staging form: Plasma Cell Myeloma and Plasma Cell Disorders, AJCC 8th Edition - Clinical stage from 03/06/2021: RISS Stage II (Beta-2-microglobulin (mg/L): 5.5, Albumin (g/dL): 3.5, ISS: Stage III, High-risk cytogenetics: Absent, LDH: Normal) - Signed by Doreatha Massed, MD on 03/06/2021   INTERVAL HISTORY:  [name], a 65 y.o. male, returns for routine follow-up and consideration for next cycle of chemotherapy. Ernest Barnett was last seen on 06/13/2021.  Due for day #8 cycle #5 of CyBorD today.   Overall, he tells me he has been feeling pretty well. He reports severe pain in his feet as  well as ankle swellings bilaterally. He also reports cramping in his legs bilaterally. He reports frequent urination. He takes Lasix 2 times a day. He reports chronic SOB due to smoking history. The cramping in his hand has resolved.   Overall, he feels ready for next cycle of chemo today.   REVIEW OF SYSTEMS:  Review of Systems  Constitutional:  Negative for appetite change and fatigue (75%).  Respiratory:  Positive for shortness of breath.   Cardiovascular:  Positive for leg swelling.  Genitourinary:  Positive for frequency.   Musculoskeletal:  Positive for arthralgias (8/10 feet).  All other systems reviewed and are negative.  PAST MEDICAL/SURGICAL HISTORY:  Past Medical History:  Diagnosis Date   Chronic kidney disease    COPD (chronic obstructive pulmonary disease) (HCC)    CVA (cerebral vascular accident) (HCC) 2013   Cystoid macular edema of left eye 03/07/2021   GERD (gastroesophageal reflux disease)    HOH (hard of hearing)    Hypercholesteremia    Hypertension    Left epiretinal membrane 03/07/2021   Pseudophakia 03/07/2021   Stroke (HCC) 03/24/2012   left sided weakness   Vitamin D deficiency    Past Surgical History:  Procedure Laterality Date   AV FISTULA PLACEMENT Right 02/08/2021   Procedure: RIGHT ARM ARTERIOVENOUS (AV) FISTULA CREATION;  Surgeon: Larina Earthly, MD;  Location: [facility];  Service: Vascular;  Laterality: Right;   BIOPSY  11/12/2018   Procedure: BIOPSY;  Surgeon: Malissa Hippo, MD;  Location: [facility];  Service: Endoscopy;;  colon   BIOPSY  05/10/2020   Procedure: BIOPSY;  Surgeon: Karilyn Cota,  Mechele Dawley, MD;  Location: [facility];  Service: Endoscopy;;  esophagus   CATARACT EXTRACTION     right eye   CATARACT EXTRACTION W/PHACO  10/08/2012   Procedure: CATARACT EXTRACTION PHACO AND INTRAOCULAR LENS PLACEMENT (IOC);  Surgeon: Tonny Branch, MD;  Location: [facility];  Service: Ophthalmology;  Laterality: Left;  CDE:6.64   CHOLECYSTECTOMY     [place]   COLONOSCOPY  N/A 11/12/2018   Procedure: COLONOSCOPY;  Surgeon: Rogene Houston, MD;  Location: [facility];  Service: Endoscopy;  Laterality: N/A;  1030   ELBOW FRACTURE SURGERY     left   ESOPHAGEAL DILATION N/A 11/12/2018   Procedure: ESOPHAGEAL DILATION;  Surgeon: Rogene Houston, MD;  [facility]: [facility];  Service: Endoscopy;  Laterality: N/A;   ESOPHAGEAL DILATION N/A 05/10/2020   Procedure: ESOPHAGEAL DILATION;  Surgeon: Rogene Houston, MD;  [facility];  Service: Endoscopy;  Laterality: N/A;   ESOPHAGOGASTRODUODENOSCOPY N/A 11/12/2018   Procedure: ESOPHAGOGASTRODUODENOSCOPY (EGD);  Surgeon: Rogene Houston, MD;  [facility];  Service: Endoscopy;  Laterality: N/A;   ESOPHAGOGASTRODUODENOSCOPY N/A 05/10/2020   Procedure: ESOPHAGOGASTRODUODENOSCOPY (EGD);  Surgeon: Rogene Houston, MD;  [facility];  Service: Endoscopy;  Laterality: N/A;  210   HERNIA REPAIR     right inguinal   HYDROCELE EXCISION / REPAIR     POLYPECTOMY  11/12/2018   Procedure: POLYPECTOMY;  Surgeon: Rogene Houston, MD;  [facility]: [facility];  Service: Endoscopy;;  colon    RETINAL DETACHMENT SURGERY Left 2019   SPLENECTOMY, TOTAL     TONSILLECTOMY     VOCAL CORD INJECTION     removal of polyp-2005    SOCIAL HISTORY:  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  Tobacco Use   Smoking status: Former    Packs/day: 3.00    Years: 30.00    Pack years: 90.00    Types: Cigarettes   Smokeless tobacco: Current   Tobacco comments:    Currently vape  Vaping Use   Vaping Use: Every day   Substances: Nicotine  Substance and Sexual Activity   Alcohol use: Yes    Alcohol/week: 1.0 - 2.0 standard drink    Types: 1 - 2 Cans of  beer per week    Comment: Occasionally   Drug use: No   Sexual activity: Not Currently    Birth control/protection: Abstinence, None  Other Topics Concern   Not on file  Social History Narrative   2 children, both nearby   Social Determinants of Health   Financial Resource Strain: Low Risk    Difficulty of Paying Living Expenses: Not hard at all  Food Insecurity: No Food Insecurity   Worried About Charity fundraiser in the Last Year: Never true   Arboriculturist in the Last Year: Never true  Transportation Needs: No Transportation Needs   Lack of Transportation (Medical): No   Lack of Transportation (Non-Medical): No  Physical Activity: Insufficiently Active   Days of Exercise per Week: 3 days   Minutes of Exercise per Session: 30 min  Stress: No Stress Concern Present   Feeling of Stress :  Only a little  Social Connections: Socially Isolated   Frequency of Communication with Friends and Family: More than three times a week   Frequency of Social Gatherings with Friends and Family: More than three times a week   Attends Religious Services: Never   Marine scientist or Organizations: No   Attends Music therapist: Never   Marital Status: Divorced  Human resources officer Violence: Not At Risk   Fear of Current or Ex-Partner: No   Emotionally Abused: No   Physically Abused: No   Sexually Abused: No    FAMILY HISTORY:  No family history on file.  CURRENT MEDICATIONS:  Current Outpatient Medications  Medication Sig Dispense Refill   albuterol (VENTOLIN HFA) 108 (90 Base) MCG/ACT inhaler Inhale 2 puffs into the lungs every 6 (six) hours as needed for wheezing or shortness of breath. 8 g 0   amLODipine (NORVASC) 5 MG tablet Take 5 mg by mouth daily. (Patient not taking: Reported on 07/05/2021)     aspirin EC 81 MG tablet Take 1 tablet (81 mg total) by mouth daily. 30 tablet 11   bortezomib IV (VELCADE) 3.5 MG injection Inject 1.5 mg/m2 into the vein once a week.      budesonide-formoterol (SYMBICORT) 80-4.5 MCG/ACT inhaler Inhale 2 puffs into the lungs 2 (two) times daily as needed.     calcitRIOL (ROCALTROL) 0.25 MCG capsule Take 0.25 mcg by mouth every Monday, Wednesday, and Friday.     Calcium Carbonate-Vit D-Min (CALCIUM 1200 PO) Take 1-2 tablets by mouth daily.     cholecalciferol (VITAMIN D3) 25 MCG (1000 UNIT) tablet Take 1,000 Units by mouth daily.     CYCLOPHOSPHAMIDE IV Inject 300 mg/m2 into the vein once a week.     dexamethasone 40 mg in sodium chloride 0.9 % 50 mL Inject 40 mg into the vein once a week.     furosemide (LASIX) 20 MG tablet Take 20 mg by mouth 2 (two) times daily.     Multiple Vitamin (MULTIVITAMIN) tablet Take by mouth.     Omega-3 Fatty Acids (FISH OIL) 1000 MG CAPS Take 1,000 mg by mouth in the morning, at noon, and at bedtime.      pantoprazole (PROTONIX) 40 MG tablet Take 1 tablet (40 mg total) by mouth 2 (two) times daily. 180 tablet 3   polyethylene glycol (MIRALAX / GLYCOLAX) packet Take 17 g by mouth daily. Patient states that he takes as needed. 30 each 5   pravastatin (PRAVACHOL) 80 MG tablet Take 1 tablet (80 mg total) by mouth daily. 90 tablet 3   sodium bicarbonate 650 MG tablet Take 650 mg by mouth 3 (three) times daily.     tamsulosin (FLOMAX) 0.4 MG CAPS capsule Take 0.4 mg by mouth daily.      tiZANidine (ZANAFLEX) 2 MG tablet Take 1 tablet (2 mg total) by mouth every 6 (six) hours as needed for muscle spasms. 30 tablet 1   No current facility-administered medications for this visit.   Facility-Administered Medications Ordered in Other Visits  Medication Dose Route Frequency Provider Last Rate Last Admin   palonosetron (ALOXI) 0.25 MG/5ML injection             ALLERGIES:  No Known Allergies  PHYSICAL EXAM:  Performance status (ECOG): 1 - Symptomatic but completely ambulatory  There were no vitals filed for this visit. Wt Readings from Last 3 Encounters:  07/05/21 248 lb (112.5 kg)  07/04/21 246 lb  12.8 oz (111.9 kg)  06/27/21 247 lb (112 kg)   Physical Exam Vitals reviewed.  Constitutional:      Appearance: Normal appearance.  Cardiovascular:     Rate and Rhythm: Normal rate and regular rhythm.     Pulses: Normal pulses.     Heart sounds: Normal heart sounds.  Pulmonary:     Effort: Pulmonary effort is normal.     Breath sounds: Normal breath sounds.  Musculoskeletal:     Right lower leg: Edema (2+) present.     Left lower leg: Edema (2+) present.  Neurological:     General: No focal deficit present.     Mental Status: He is alert and oriented to person, place, and time.  Psychiatric:        Mood and Affect: Mood normal.        Behavior: Behavior normal.    LABORATORY DATA:  I have reviewed the labs as listed.  CBC Latest Ref Rng & Units 07/04/2021 06/27/2021 06/20/2021  WBC 4.0 - 10.5 K/uL 6.5 6.6 6.8  Hemoglobin 13.0 - 17.0 g/dL 10.0(L) 10.2(L) 9.8(L)  Hematocrit 39.0 - 52.0 % 29.7(L) 30.6(L) 29.6(L)  Platelets 150 - 400 K/uL 168 175 182   CMP Latest Ref Rng & Units 07/04/2021 06/27/2021 06/20/2021  Glucose 70 - 99 mg/dL 118(H) 109(H) 104(H)  BUN 8 - 23 mg/dL 42(H) 32(H) 37(H)  Creatinine 0.61 - 1.24 mg/dL 3.75(H) 3.20(H) 3.26(H)  Sodium 135 - 145 mmol/L 135 136 137  Potassium 3.5 - 5.1 mmol/L 4.4 4.1 4.3  Chloride 98 - 111 mmol/L 106 108 110  CO2 22 - 32 mmol/L 21(L) 22 22  Calcium 8.9 - 10.3 mg/dL 8.7(L) 8.4(L) 8.7(L)  Total Protein 6.5 - 8.1 g/dL 7.5 7.4 7.3  Total Bilirubin 0.3 - 1.2 mg/dL 0.5 0.7 0.7  Alkaline Phos 38 - 126 U/L 31(L) 31(L) 31(L)  AST 15 - 41 U/L $Remo'17 19 17  'DiNXD$ ALT 0 - 44 U/L $Remo'21 25 22    'AhraU$ DIAGNOSTIC IMAGING:  I have independently reviewed the scans and discussed with the patient. ECHOCARDIOGRAM COMPLETE  Result Date: 07/10/2021    ECHOCARDIOGRAM REPORT   Patient Name:   Ernest Barnett Date of Exam: 07/10/2021 Medical Rec #:  [id number]      Height:       67.0 in Accession #:    [id number]     Weight:       248.0 lb Date of Birth:  December 11, 1955      BSA:           2.216 m Patient Age:    89 years       BP:           113/77 mmHg Patient Gender: M              HR:           94 bpm. Exam Location:  [facility] Procedure: 2D Echo, Cardiac Doppler and Color Doppler Indications:    R06.02 (ICD-10-CM) - SOB (shortness of breath)  History:        Patient has no prior history of Echocardiogram examinations.                 COPD and Stroke; Risk Factors:Dyslipidemia and Hypertension.                 Chronic kidney disease, stage V ([place]). Multiple myeloma ([place]).  Sonographer:    Alvino Chapel RCS Referring Phys: [id number] [place]  1. Mild distal anterior  and apical hypokinesis. Consider limited echo with Definity to further define wall motion. . Left ventricular ejection fraction, by estimation, is 50 to 55%. The left ventricle has low normal function. The left ventricle has no regional wall motion abnormalities. There is mild left ventricular hypertrophy. Indeterminate diastolic filling due to E-A fusion.  2. Right ventricular systolic function is normal. The right ventricular size is normal.  3. The mitral valve is normal in structure. Trivial mitral valve regurgitation.  4. The aortic valve is tricuspid. Aortic valve regurgitation is not visualized. Mild aortic valve sclerosis is present, with no evidence of aortic valve stenosis.  5. The inferior vena cava is dilated in size with >50% respiratory variability, suggesting right atrial pressure of 8 mmHg. FINDINGS  Left Ventricle: Mild distal anterior and apical hypokinesis. Consider limited echo with Definity to further define wall motion. Left ventricular ejection fraction, by estimation, is 50 to 55%. The left ventricle has low normal function. The left ventricle has no regional wall motion abnormalities. The left ventricular internal cavity size was normal in size. There is mild left ventricular hypertrophy. Indeterminate diastolic filling due to E-A fusion. Right Ventricle: The right ventricular size  is normal. Right vetricular wall thickness was not assessed. Right ventricular systolic function is normal. Left Atrium: Left atrial size was normal in size. Right Atrium: Right atrial size was normal in size. Pericardium: Trivial pericardial effusion is present. Mitral Valve: The mitral valve is normal in structure. Trivial mitral valve regurgitation. Tricuspid Valve: The tricuspid valve is normal in structure. Tricuspid valve regurgitation is trivial. Aortic Valve: The aortic valve is tricuspid. Aortic valve regurgitation is not visualized. Mild aortic valve sclerosis is present, with no evidence of aortic valve stenosis. Pulmonic Valve: The pulmonic valve was not well visualized. Pulmonic valve regurgitation is not visualized. No evidence of pulmonic stenosis. Aorta: The aortic root is normal in size and structure. Venous: The inferior vena cava is dilated in size with greater than 50% respiratory variability, suggesting right atrial pressure of 8 mmHg. IAS/Shunts: No atrial level shunt detected by color flow Doppler.  LEFT VENTRICLE PLAX 2D LVIDd:         4.30 cm LVIDs:         3.00 cm LV PW:         1.20 cm LV IVS:        1.30 cm LVOT diam:     2.10 cm LV SV:         65 LV SV Index:   30 LVOT Area:     3.46 cm  RIGHT VENTRICLE RV S prime:     11.20 cm/s TAPSE (M-mode): 1.8 cm LEFT ATRIUM             Index       RIGHT ATRIUM           Index LA diam:        3.00 cm 1.35 cm/m  RA Area:     13.00 cm LA Vol (A2C):   56.3 ml 25.41 ml/m RA Volume:   30.20 ml  13.63 ml/m LA Vol (A4C):   51.8 ml 23.38 ml/m LA Biplane Vol: 54.3 ml 24.51 ml/m  AORTIC VALVE LVOT Vmax:   85.70 cm/s LVOT Vmean:  53.200 cm/s LVOT VTI:    0.189 m  AORTA Ao Root diam: 3.70 cm MITRAL VALVE MV Area (PHT): 4.33 cm     SHUNTS MV Decel Time: 175 msec     Systemic VTI:  0.19 m  MV E velocity: 117.00 cm/s  Systemic Diam: 2.10 cm Dorris Carnes MD Electronically signed by Dorris Carnes MD Signature Date/Time: 07/10/2021/5:57:45 PM    Final       ASSESSMENT:  1.  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  Basis for CKD is hypertension associated arteriosclerosis with arterionephrosclerosis. - Bone marrow biopsy on 02/21/2021 with slightly hypercellular bone marrow with trilineage hematopoiesis.  Plasma cells increased in number representing 11% of all cells with kappa light chain restriction. - Myeloma FISH panel with no evidence of T p53.  Plasma cell enrichment yielded limited cellularity, therefore only tested for T p53 probes at. - Chromosome analysis 45, X,- Y (8)/46, XY (12) - Labs on 02/01/2021-M spike 2 g, kappa light chain 794, free light chain ratio 49.97.  Beta-2 microglobulin 5.5, albumin 3.5.  LDH normal. - PET scan on 03/08/2021 with no evidence of active myeloma.  No soft tissue plasmacytoma. - 24-hour urine total protein 2.2 g on 03/13/2021.  Immunofixation positive for IgG kappa. - CyBorD started on 03/14/2021.   2.  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  Smoked 2 packs/day for 32 years and quit smoking cigarettes and vaping now. -Mother had small cell lung cancer.   3.  CKD: -He has stage V CKD thought to be secondary to hypertension. -Renal ultrasound on 12/19/2020 was negative for obstructive uropathy and findings were consistent with chronic medical renal disease.  Bilateral kidney cysts were seen. -He has 2.7 g of proteinuria on 24-hour urine. -Kidney biopsy was done on 01/25/2021.   PLAN:  1.  Stage II, standard risk IgG kappa multiple myeloma: - He has completed 4 cycles of CyBorD. - Reviewed myeloma labs from 07/04/2021.  M spike is 1.1 g, more or less stable.  Kappa light chains and ratio has increased slightly based on worsening renal function.  No significant changes compared to prior myeloma labs. - He reported worsening fluid  retention in the legs extending up to the upper thighs.  He is currently taking Lasix 20 mg in the morning and 20 mg at noontime. - Reviewed his labs today.  Creatinine is 3.57 and the rest of the CMP was within normal limits.  CBC was normal. - We will hold off on treatments for the next 2 weeks. - We will consider decreasing Velcade dose once we restart treatments and might also consider switching Cytoxan to lenalidomide, renally dosed.   2.  Smoking history: - CT chest on 02/15/2021 was lung RADS 1.   3.  ID prophylaxis: - Continue aspirin for thromboprophylaxis and acyclovir twice daily.   4.  Myeloma bone disease: - He is tolerating denosumab monthly very well.  Calcium today is 9.1.  Continue monthly denosumab.  5.  Lower extremity swelling: - He was told to take Lasix 40 mg in the mornings daily.  6.  Peripheral neuropathy: - He reports some pain in the bottom of his feet.  We will start him on gabapentin 300 mg twice daily, renally dosed. - We will consider decreasing Velcade dose on restart of treatments.   Orders placed this encounter:  No orders of the defined types were placed in this encounter.    Derek Jack, MD [place] [phone]   I, Thana Ates, am acting as a scribe for Dr. Derek Jack.  Kinnie Scales MD, have reviewed the  above documentation for accuracy and completeness, and I agree with the above.     

## 2021-07-10 NOTE — Progress Notes (Signed)
*  PRELIMINARY RESULTS* Echocardiogram 2D Echocardiogram has been performed.  Ernest Barnett 07/10/2021, 10:20 AM

## 2021-07-11 ENCOUNTER — Inpatient Hospital Stay (HOSPITAL_COMMUNITY): Payer: Medicare HMO

## 2021-07-11 ENCOUNTER — Encounter (HOSPITAL_COMMUNITY): Payer: Self-pay | Admitting: Hematology

## 2021-07-11 ENCOUNTER — Inpatient Hospital Stay (HOSPITAL_COMMUNITY): Payer: Medicare HMO | Admitting: Hematology

## 2021-07-11 ENCOUNTER — Inpatient Hospital Stay (HOSPITAL_COMMUNITY): Payer: Medicare HMO | Attending: Hematology

## 2021-07-11 DIAGNOSIS — F17290 Nicotine dependence, other tobacco product, uncomplicated: Secondary | ICD-10-CM | POA: Insufficient documentation

## 2021-07-11 DIAGNOSIS — C9000 Multiple myeloma not having achieved remission: Secondary | ICD-10-CM | POA: Insufficient documentation

## 2021-07-11 DIAGNOSIS — M7989 Other specified soft tissue disorders: Secondary | ICD-10-CM | POA: Diagnosis not present

## 2021-07-11 DIAGNOSIS — G629 Polyneuropathy, unspecified: Secondary | ICD-10-CM | POA: Diagnosis not present

## 2021-07-11 DIAGNOSIS — D472 Monoclonal gammopathy: Secondary | ICD-10-CM | POA: Diagnosis not present

## 2021-07-11 DIAGNOSIS — N189 Chronic kidney disease, unspecified: Secondary | ICD-10-CM | POA: Insufficient documentation

## 2021-07-11 DIAGNOSIS — R69 Illness, unspecified: Secondary | ICD-10-CM | POA: Diagnosis not present

## 2021-07-11 LAB — COMPREHENSIVE METABOLIC PANEL
ALT: 22 U/L (ref 0–44)
AST: 17 U/L (ref 15–41)
Albumin: 3.8 g/dL (ref 3.5–5.0)
Alkaline Phosphatase: 31 U/L — ABNORMAL LOW (ref 38–126)
Anion gap: 7 (ref 5–15)
BUN: 35 mg/dL — ABNORMAL HIGH (ref 8–23)
CO2: 22 mmol/L (ref 22–32)
Calcium: 9.1 mg/dL (ref 8.9–10.3)
Chloride: 110 mmol/L (ref 98–111)
Creatinine, Ser: 3.57 mg/dL — ABNORMAL HIGH (ref 0.61–1.24)
GFR, Estimated: 18 mL/min — ABNORMAL LOW (ref >60)
Glucose, Bld: 105 mg/dL — ABNORMAL HIGH (ref 70–99)
Potassium: 4.4 mmol/L (ref 3.5–5.1)
Sodium: 139 mmol/L (ref 135–145)
Total Bilirubin: 0.7 mg/dL (ref 0.3–1.2)
Total Protein: 7.4 g/dL (ref 6.5–8.1)

## 2021-07-11 LAB — CBC WITH DIFFERENTIAL/PLATELET
Abs Immature Granulocytes: 0.09 10*3/uL — ABNORMAL HIGH (ref 0.00–0.07)
Basophils Absolute: 0.1 10*3/uL (ref 0.0–0.1)
Basophils Relative: 1 %
Eosinophils Absolute: 0.4 10*3/uL (ref 0.0–0.5)
Eosinophils Relative: 6 %
HCT: 31.5 % — ABNORMAL LOW (ref 39.0–52.0)
Hemoglobin: 10.3 g/dL — ABNORMAL LOW (ref 13.0–17.0)
Immature Granulocytes: 1 %
Lymphocytes Relative: 25 %
Lymphs Abs: 1.7 10*3/uL (ref 0.7–4.0)
MCH: 34 pg (ref 26.0–34.0)
MCHC: 32.7 g/dL (ref 30.0–36.0)
MCV: 104 fL — ABNORMAL HIGH (ref 80.0–100.0)
Monocytes Absolute: 1.3 10*3/uL — ABNORMAL HIGH (ref 0.1–1.0)
Monocytes Relative: 18 %
Neutro Abs: 3.4 10*3/uL (ref 1.7–7.7)
Neutrophils Relative %: 49 %
Platelets: 184 10*3/uL (ref 150–400)
RBC: 3.03 MIL/uL — ABNORMAL LOW (ref 4.22–5.81)
RDW: 15.3 % (ref 11.5–15.5)
WBC: 6.9 10*3/uL (ref 4.0–10.5)
nRBC: 3.9 % — ABNORMAL HIGH (ref 0.0–0.2)

## 2021-07-11 LAB — MAGNESIUM: Magnesium: 2.2 mg/dL (ref 1.7–2.4)

## 2021-07-11 MED ORDER — GABAPENTIN 300 MG PO CAPS: 300.0000 mg | ORAL_CAPSULE | Freq: Two times a day (BID) | ORAL | 1 refills | Status: DC

## 2021-07-11 NOTE — Patient Instructions (Addendum)
[place] at [facility] Discharge Instructions  You were seen today by Dr. Delton Coombes. He went over your recent results. You have been prescribed Gabapentin: take 1 tablet in the morning and 1 tablet at bedtime. Dr. Delton Coombes will see you back in 2 weeks for labs and follow up.   Thank you for choosing [place] at [facility] to provide your oncology and hematology care.  To afford each patient quality time with our provider, please arrive at least 15 minutes before your scheduled appointment time.   If you have a lab appointment with the [facility] please come in thru the Main Entrance and check in at the main information desk  You need to re-schedule your appointment should you arrive 10 or more minutes late.  We strive to give you quality time with our providers, and arriving late affects you and other patients whose appointments are after yours.  Also, if you no show three or more times for appointments you may be dismissed from the clinic at the providers discretion.     Again, thank you for choosing [facility].  Our hope is that these requests will decrease the amount of time that you wait before being seen by our physicians.       _____________________________________________________________  Should you have questions after your visit to [facility], please contact our office at [phone] between the hours of 8:00 a.m. and 4:30 p.m.  Voicemails left after 4:00 p.m. will not be returned until the following business day.  For prescription refill requests, have your pharmacy contact our office and allow 72 hours.    Cancer Center Support Programs:   > Cancer Support Group  2nd Tuesday of the month 1pm-2pm, Journey Room

## 2021-07-11 NOTE — Progress Notes (Signed)
Not receiving treatment today per Dr.Katragadda.

## 2021-07-13 DIAGNOSIS — N189 Chronic kidney disease, unspecified: Secondary | ICD-10-CM | POA: Diagnosis not present

## 2021-07-13 DIAGNOSIS — R809 Proteinuria, unspecified: Secondary | ICD-10-CM | POA: Diagnosis not present

## 2021-07-13 DIAGNOSIS — N184 Chronic kidney disease, stage 4 (severe): Secondary | ICD-10-CM | POA: Diagnosis not present

## 2021-07-13 DIAGNOSIS — E211 Secondary hyperparathyroidism, not elsewhere classified: Secondary | ICD-10-CM | POA: Diagnosis not present

## 2021-07-13 DIAGNOSIS — C9000 Multiple myeloma not having achieved remission: Secondary | ICD-10-CM | POA: Diagnosis not present

## 2021-07-13 DIAGNOSIS — N08 Glomerular disorders in diseases classified elsewhere: Secondary | ICD-10-CM | POA: Diagnosis not present

## 2021-07-13 DIAGNOSIS — R600 Localized edema: Secondary | ICD-10-CM | POA: Diagnosis not present

## 2021-07-13 DIAGNOSIS — D631 Anemia in chronic kidney disease: Secondary | ICD-10-CM | POA: Diagnosis not present

## 2021-07-19 ENCOUNTER — Telehealth: Payer: Self-pay

## 2021-07-19 DIAGNOSIS — R0602 Shortness of breath: Secondary | ICD-10-CM

## 2021-07-19 NOTE — Telephone Encounter (Signed)
-----   Message from Arnoldo Lenis, MD sent at 07/15/2021  8:29 AM EDT ----- Difficult visualization on echocardiogram, can he get a limited to better evaluate LV function and wall motion   Zandra Abts MD

## 2021-07-19 NOTE — Telephone Encounter (Signed)
Pt notified and agreed to limited echo.

## 2021-07-24 ENCOUNTER — Telehealth (HOSPITAL_COMMUNITY): Payer: Self-pay | Admitting: *Deleted

## 2021-07-24 NOTE — Telephone Encounter (Signed)
Patient called to advise that Dr [name] has put him on 80 mg of lasix bid for leg swelling.  Unfortunately, his edema is the same as it was at last OV.  Has an appointment for labs and treatment tomorrow and feels he needs to wait until this is resolved.

## 2021-07-25 ENCOUNTER — Ambulatory Visit (HOSPITAL_COMMUNITY): Payer: Medicare HMO | Admitting: Hematology

## 2021-07-25 ENCOUNTER — Ambulatory Visit (HOSPITAL_COMMUNITY): Payer: Medicare HMO

## 2021-07-25 ENCOUNTER — Inpatient Hospital Stay (HOSPITAL_COMMUNITY): Payer: Medicare HMO

## 2021-07-25 DIAGNOSIS — N08 Glomerular disorders in diseases classified elsewhere: Secondary | ICD-10-CM | POA: Diagnosis not present

## 2021-07-25 DIAGNOSIS — N184 Chronic kidney disease, stage 4 (severe): Secondary | ICD-10-CM | POA: Diagnosis not present

## 2021-07-25 DIAGNOSIS — R809 Proteinuria, unspecified: Secondary | ICD-10-CM | POA: Diagnosis not present

## 2021-07-25 DIAGNOSIS — C9000 Multiple myeloma not having achieved remission: Secondary | ICD-10-CM | POA: Diagnosis not present

## 2021-07-25 DIAGNOSIS — D631 Anemia in chronic kidney disease: Secondary | ICD-10-CM | POA: Diagnosis not present

## 2021-07-25 DIAGNOSIS — E211 Secondary hyperparathyroidism, not elsewhere classified: Secondary | ICD-10-CM | POA: Diagnosis not present

## 2021-07-25 DIAGNOSIS — N189 Chronic kidney disease, unspecified: Secondary | ICD-10-CM | POA: Diagnosis not present

## 2021-07-27 ENCOUNTER — Encounter: Payer: Self-pay | Admitting: Nurse Practitioner

## 2021-07-27 DIAGNOSIS — D631 Anemia in chronic kidney disease: Secondary | ICD-10-CM | POA: Diagnosis not present

## 2021-07-27 DIAGNOSIS — R809 Proteinuria, unspecified: Secondary | ICD-10-CM | POA: Diagnosis not present

## 2021-07-27 DIAGNOSIS — I120 Hypertensive chronic kidney disease with stage 5 chronic kidney disease or end stage renal disease: Secondary | ICD-10-CM | POA: Diagnosis not present

## 2021-07-27 DIAGNOSIS — R600 Localized edema: Secondary | ICD-10-CM | POA: Diagnosis not present

## 2021-07-27 DIAGNOSIS — E211 Secondary hyperparathyroidism, not elsewhere classified: Secondary | ICD-10-CM | POA: Diagnosis not present

## 2021-07-27 DIAGNOSIS — N185 Chronic kidney disease, stage 5: Secondary | ICD-10-CM | POA: Diagnosis not present

## 2021-07-27 DIAGNOSIS — N08 Glomerular disorders in diseases classified elsewhere: Secondary | ICD-10-CM | POA: Diagnosis not present

## 2021-07-27 DIAGNOSIS — C9000 Multiple myeloma not having achieved remission: Secondary | ICD-10-CM | POA: Diagnosis not present

## 2021-07-27 DIAGNOSIS — N189 Chronic kidney disease, unspecified: Secondary | ICD-10-CM | POA: Diagnosis not present

## 2021-07-30 DIAGNOSIS — N08 Glomerular disorders in diseases classified elsewhere: Secondary | ICD-10-CM | POA: Diagnosis not present

## 2021-07-30 DIAGNOSIS — N189 Chronic kidney disease, unspecified: Secondary | ICD-10-CM | POA: Diagnosis not present

## 2021-07-30 DIAGNOSIS — N184 Chronic kidney disease, stage 4 (severe): Secondary | ICD-10-CM | POA: Diagnosis not present

## 2021-07-30 DIAGNOSIS — D631 Anemia in chronic kidney disease: Secondary | ICD-10-CM | POA: Diagnosis not present

## 2021-07-30 DIAGNOSIS — R600 Localized edema: Secondary | ICD-10-CM | POA: Diagnosis not present

## 2021-07-30 DIAGNOSIS — R809 Proteinuria, unspecified: Secondary | ICD-10-CM | POA: Diagnosis not present

## 2021-07-30 DIAGNOSIS — E211 Secondary hyperparathyroidism, not elsewhere classified: Secondary | ICD-10-CM | POA: Diagnosis not present

## 2021-07-30 DIAGNOSIS — C9000 Multiple myeloma not having achieved remission: Secondary | ICD-10-CM | POA: Diagnosis not present

## 2021-08-01 DIAGNOSIS — N189 Chronic kidney disease, unspecified: Secondary | ICD-10-CM | POA: Diagnosis not present

## 2021-08-01 DIAGNOSIS — N08 Glomerular disorders in diseases classified elsewhere: Secondary | ICD-10-CM | POA: Diagnosis not present

## 2021-08-01 DIAGNOSIS — C9000 Multiple myeloma not having achieved remission: Secondary | ICD-10-CM | POA: Diagnosis not present

## 2021-08-01 DIAGNOSIS — E211 Secondary hyperparathyroidism, not elsewhere classified: Secondary | ICD-10-CM | POA: Diagnosis not present

## 2021-08-01 DIAGNOSIS — D631 Anemia in chronic kidney disease: Secondary | ICD-10-CM | POA: Diagnosis not present

## 2021-08-01 DIAGNOSIS — R809 Proteinuria, unspecified: Secondary | ICD-10-CM | POA: Diagnosis not present

## 2021-08-01 DIAGNOSIS — R600 Localized edema: Secondary | ICD-10-CM | POA: Diagnosis not present

## 2021-08-01 DIAGNOSIS — N184 Chronic kidney disease, stage 4 (severe): Secondary | ICD-10-CM | POA: Diagnosis not present

## 2021-08-07 NOTE — Progress Notes (Signed)
Ernest Barnett [address], [place] [address]   CLINIC:  Medical Oncology/Hematology  PCP:  Ernest Roberts, NP [address]  [address] / [place] [address] [phone]   REASON FOR VISIT:  Follow-up for MGUS  PRIOR THERAPY: none  NGS Results: not done  CURRENT THERAPY: CyBorD weekly  BRIEF ONCOLOGIC HISTORY:  Oncology History  Multiple myeloma (HCC)  03/06/2021 Initial Diagnosis   Multiple myeloma (HCC)   03/06/2021 Cancer Staging   Staging form: Plasma Cell Myeloma and Plasma Cell Disorders, AJCC 8th Edition - Clinical stage from 03/06/2021: RISS Stage II (Beta-2-microglobulin (mg/L): 5.5, Albumin (g/dL): 3.5, ISS: Stage III, High-risk cytogenetics: Absent, LDH: Normal) - Signed by Doreatha Massed, MD on 03/06/2021 Stage prefix: Initial diagnosis Beta 2 microglobulin range (mg/L): Greater than or equal to 5.5 Albumin range (g/dL): Greater than or equal to 3.5 Cytogenetics: No abnormalities Pretreatment monoclonal protein level in serum (M spike) (g/dL): 2 Pretreatment serum free kappa light chain level (g/L): 794 Pretreatment serum free lambda light chain level (g/L): 15.9   03/14/2021 -  Chemotherapy    Patient is on Treatment Plan: MULTIPLE MYELOMA CYBORD - WEEKLY BORTEZOMIB         CANCER STAGING: Cancer Staging Multiple myeloma (HCC) Staging form: Plasma Cell Myeloma and Plasma Cell Disorders, AJCC 8th Edition - Clinical stage from 03/06/2021: RISS Stage II (Beta-2-microglobulin (mg/L): 5.5, Albumin (g/dL): 3.5, ISS: Stage III, High-risk cytogenetics: Absent, LDH: Normal) - Signed by Doreatha Massed, MD on 03/06/2021   INTERVAL HISTORY:  Ernest Barnett, a 65 y.o. male, returns for routine follow-up of his MGUS. Ernest Barnett was last seen on 07/11/2021.   Today he reports feeling good. He reports occasional back pain which is not helped by tylenol; the pain is worsened upon straightening up. The neuropathy in the soles of his  feet has improved with Gabapentin, and the swellings in his leg are also improved although the swellings are worse in the right leg. He denies currently taking any steroids.   REVIEW OF SYSTEMS:  Review of Systems  Constitutional:  Positive for appetite change (25%). Negative for fatigue (75%).  Respiratory:  Positive for cough and shortness of breath.   Musculoskeletal:  Positive for arthralgias (4/10 L knee) and back pain (4/10).  All other systems reviewed and are negative.  PAST MEDICAL/SURGICAL HISTORY:  Past Medical History:  Diagnosis Date   Chronic kidney disease    COPD (chronic obstructive pulmonary disease) (HCC)    CVA (cerebral vascular accident) (HCC) 2013   Cystoid macular edema of left eye 03/07/2021   GERD (gastroesophageal reflux disease)    HOH (hard of hearing)    Hypercholesteremia    Hypertension    Left epiretinal membrane 03/07/2021   Pseudophakia 03/07/2021   Stroke (HCC) 03/24/2012   left sided weakness   Vitamin D deficiency    Past Surgical History:  Procedure Laterality Date   AV FISTULA PLACEMENT Right 02/08/2021   Procedure: RIGHT ARM ARTERIOVENOUS (AV) FISTULA CREATION;  Surgeon: Larina Earthly, MD;  Location: [facility];  Service: Vascular;  Laterality: Right;   BIOPSY  11/12/2018   Procedure: BIOPSY;  Surgeon: Malissa Hippo, MD;  Location: [facility];  Service: Endoscopy;;  colon   BIOPSY  05/10/2020   Procedure: BIOPSY;  Surgeon: Malissa Hippo, MD;  [facility]: [facility];  Service: Endoscopy;;  esophagus   CATARACT EXTRACTION     right eye   CATARACT EXTRACTION W/PHACO  10/08/2012   Procedure: CATARACT EXTRACTION PHACO AND INTRAOCULAR LENS PLACEMENT (IOC);  Surgeon: Tonny Branch, MD;  Location: [facility];  Service: Ophthalmology;  Laterality: Left;  CDE:6.64   CHOLECYSTECTOMY     [place]   COLONOSCOPY N/A 11/12/2018   Procedure: COLONOSCOPY;  Surgeon: Rogene Houston, MD;  Location: [facility];  Service: Endoscopy;  Laterality: N/A;  1030   ELBOW FRACTURE  SURGERY     left   ESOPHAGEAL DILATION N/A 11/12/2018   Procedure: ESOPHAGEAL DILATION;  Surgeon: Rogene Houston, MD;  [facility];  Service: Endoscopy;  Laterality: N/A;   ESOPHAGEAL DILATION N/A 05/10/2020   Procedure: ESOPHAGEAL DILATION;  Surgeon: Rogene Houston, MD;  [facility];  Service: Endoscopy;  Laterality: N/A;   ESOPHAGOGASTRODUODENOSCOPY N/A 11/12/2018   Procedure: ESOPHAGOGASTRODUODENOSCOPY (EGD);  Surgeon: Rogene Houston, MD;  [facility];  Service: Endoscopy;  Laterality: N/A;   ESOPHAGOGASTRODUODENOSCOPY N/A 05/10/2020   Procedure: ESOPHAGOGASTRODUODENOSCOPY (EGD);  Surgeon: Rogene Houston, MD;  [facility];  Service: Endoscopy;  Laterality: N/A;  210   HERNIA REPAIR     right inguinal   HYDROCELE EXCISION / REPAIR     POLYPECTOMY  11/12/2018   Procedure: POLYPECTOMY;  Surgeon: Rogene Houston, MD;  [facility];  Service: Endoscopy;;  colon    RETINAL DETACHMENT SURGERY Left 2019   SPLENECTOMY, TOTAL     TONSILLECTOMY     VOCAL CORD INJECTION     removal of polyp-2005    SOCIAL HISTORY:  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  Tobacco Use   Smoking status: Former    Packs/day: 3.00    Years: 30.00    Pack years: 90.00    Types: Cigarettes   Smokeless tobacco: Current   Tobacco comments:    Currently vape  Vaping Use   Vaping Use: Every day   Substances: Nicotine  Substance and Sexual Activity   Alcohol use: Yes    Alcohol/week: 1.0 - 2.0 standard drink    Types: 1 - 2 Cans of beer per week    Comment: Occasionally   Drug use: No   Sexual activity: Not Currently    Birth control/protection: Abstinence, None  Other Topics Concern    Not on file  Social History Narrative   2 children, both nearby   Social Determinants of Health   Financial Resource Strain: Low Risk    Difficulty of Paying Living Expenses: Not hard at all  Food Insecurity: No Food Insecurity   Worried About Charity fundraiser in the Last Year: Never true   Arboriculturist in the Last Year: Never true  Transportation Needs: No Transportation Needs   Lack of Transportation (Medical): No   Lack of Transportation (Non-Medical): No  Physical Activity: Insufficiently Active   Days of Exercise per Week: 3 days   Minutes of Exercise per Session: 30 min  Stress: No Stress Concern Present   Feeling of Stress : Only a little  Social Connections: Socially Isolated   Frequency of Communication with Friends and Family: More than three times a week   Frequency of Social Gatherings  with Friends and Family: More than three times a week   Attends Religious Services: Never   Marine scientist or Organizations: No   Attends Archivist Meetings: Never   Marital Status: Divorced  Human resources officer Violence: Not At Risk   Fear of Current or Ex-Partner: No   Emotionally Abused: No   Physically Abused: No   Sexually Abused: No    FAMILY HISTORY:  No family history on file.  CURRENT MEDICATIONS:  Current Outpatient Medications  Medication Sig Dispense Refill   amLODipine (NORVASC) 5 MG tablet Take 5 mg by mouth daily.     aspirin EC 81 MG tablet Take 1 tablet (81 mg total) by mouth daily. 30 tablet 11   bortezomib IV (VELCADE) 3.5 MG injection Inject 1.5 mg/m2 into the vein once a week.     budesonide-formoterol (SYMBICORT) 80-4.5 MCG/ACT inhaler Inhale 2 puffs into the lungs 2 (two) times daily as needed.     calcitRIOL (ROCALTROL) 0.25 MCG capsule Take by mouth.     Calcium Carbonate-Vit D-Min (CALCIUM 1200 PO) Take 1-2 tablets by mouth daily.     cholecalciferol (VITAMIN D3) 25 MCG (1000 UNIT) tablet Take 1,000 Units by mouth daily.      CYCLOPHOSPHAMIDE IV Inject 300 mg/m2 into the vein once a week.     dexamethasone 40 mg in sodium chloride 0.9 % 50 mL Inject 40 mg into the vein once a week.     furosemide (LASIX) 20 MG tablet Take 20 mg by mouth 2 (two) times daily.     gabapentin (NEURONTIN) 300 MG capsule Take 1 capsule (300 mg total) by mouth 2 (two) times daily. 60 capsule 1   Multiple Vitamin (MULTIVITAMIN) tablet Take by mouth.     Omega-3 Fatty Acids (FISH OIL) 1000 MG CAPS Take 1,000 mg by mouth in the morning, at noon, and at bedtime.      pantoprazole (PROTONIX) 40 MG tablet Take 1 tablet (40 mg total) by mouth 2 (two) times daily. 180 tablet 3   pravastatin (PRAVACHOL) 80 MG tablet Take 1 tablet (80 mg total) by mouth daily. 90 tablet 3   sodium bicarbonate 650 MG tablet Take 650 mg by mouth 3 (three) times daily.     tamsulosin (FLOMAX) 0.4 MG CAPS capsule Take 0.4 mg by mouth daily.      torsemide (DEMADEX) 100 MG tablet Take by mouth.     albuterol (VENTOLIN HFA) 108 (90 Base) MCG/ACT inhaler Inhale 2 puffs into the lungs every 6 (six) hours as needed for wheezing or shortness of breath. (Patient not taking: Reported on 08/08/2021) 8 g 0   polyethylene glycol (MIRALAX / GLYCOLAX) packet Take 17 g by mouth daily. Patient states that he takes as needed. (Patient not taking: Reported on 08/08/2021) 30 each 5   tiZANidine (ZANAFLEX) 2 MG tablet Take 1 tablet (2 mg total) by mouth every 6 (six) hours as needed for muscle spasms. (Patient not taking: Reported on 08/08/2021) 30 tablet 1   No current facility-administered medications for this visit.   Facility-Administered Medications Ordered in Other Visits  Medication Dose Route Frequency Provider Last Rate Last Admin   palonosetron (ALOXI) 0.25 MG/5ML injection             ALLERGIES:  No Known Allergies  PHYSICAL EXAM:  Performance status (ECOG): 1 - Symptomatic but completely ambulatory  Vitals:   08/08/21 0849  BP: 115/74  Pulse: 93  Resp: 18  Temp: (!)  96.7 F (  35.9 C)  SpO2: 96%   Wt Readings from Last 3 Encounters:  08/08/21 255 lb 9.6 oz (115.9 kg)  07/05/21 248 lb (112.5 kg)  07/04/21 246 lb 12.8 oz (111.9 kg)   Physical Exam Vitals reviewed.  Constitutional:      Appearance: Normal appearance.  Cardiovascular:     Rate and Rhythm: Normal rate and regular rhythm.     Pulses: Normal pulses.     Heart sounds: Normal heart sounds.  Pulmonary:     Effort: Pulmonary effort is normal.     Breath sounds: Normal breath sounds.  Musculoskeletal:     Right lower leg: 2+ Edema present.     Left lower leg: 2+ Edema present.  Neurological:     General: No focal deficit present.     Mental Status: He is alert and oriented to person, place, and time.  Psychiatric:        Mood and Affect: Mood normal.        Behavior: Behavior normal.     LABORATORY DATA:  I have reviewed the labs as listed.  CBC Latest Ref Rng & Units 08/08/2021 07/11/2021 07/04/2021  WBC 4.0 - 10.5 K/uL 8.0 6.9 6.5  Hemoglobin 13.0 - 17.0 g/dL 12.3(L) 10.3(L) 10.0(L)  Hematocrit 39.0 - 52.0 % 39.1 31.5(L) 29.7(L)  Platelets 150 - 400 K/uL 205 184 168   CMP Latest Ref Rng & Units 08/08/2021 07/11/2021 07/04/2021  Glucose 70 - 99 mg/dL 110(H) 105(H) 118(H)  BUN 8 - 23 mg/dL 56(H) 35(H) 42(H)  Creatinine 0.61 - 1.24 mg/dL 5.26(H) 3.57(H) 3.75(H)  Sodium 135 - 145 mmol/L 138 139 135  Potassium 3.5 - 5.1 mmol/L 3.5 4.4 4.4  Chloride 98 - 111 mmol/L 99 110 106  CO2 22 - 32 mmol/L 26 22 21(L)  Calcium 8.9 - 10.3 mg/dL 9.2 9.1 8.7(L)  Total Protein 6.5 - 8.1 g/dL 8.1 7.4 7.5  Total Bilirubin 0.3 - 1.2 mg/dL 0.7 0.7 0.5  Alkaline Phos 38 - 126 U/L 29(L) 31(L) 31(L)  AST 15 - 41 U/L $Remo'16 17 17  'rPdIl$ ALT 0 - 44 U/L $Remo'17 22 21    'PBnDG$ DIAGNOSTIC IMAGING:  I have independently reviewed the scans and discussed with the patient. ECHOCARDIOGRAM COMPLETE  Result Date: 07/10/2021    ECHOCARDIOGRAM REPORT   Patient Name:   Ernest Barnett Dave Date of Exam: 07/10/2021 Medical Rec #:  [id number]       Height:       67.0 in Accession #:    [id number]     Weight:       248.0 lb Date of Birth:  12-16-1955      BSA:          2.216 m Patient Age:    40 years       BP:           113/77 mmHg Patient Gender: M              HR:           94 bpm. Exam Location:  [facility] Procedure: 2D Echo, Cardiac Doppler and Color Doppler Indications:    R06.02 (ICD-10-CM) - SOB (shortness of breath)  History:        Patient has no prior history of Echocardiogram examinations.                 COPD and Stroke; Risk Factors:Dyslipidemia and Hypertension.  Chronic kidney disease, stage V ([place]). Multiple myeloma ([place]).  Sonographer:    Alvino Chapel RCS Referring Phys: [id number] [place]  1. Mild distal anterior and apical hypokinesis. Consider limited echo with Definity to further define wall motion. . Left ventricular ejection fraction, by estimation, is 50 to 55%. The left ventricle has low normal function. The left ventricle has no regional wall motion abnormalities. There is mild left ventricular hypertrophy. Indeterminate diastolic filling due to E-A fusion.  2. Right ventricular systolic function is normal. The right ventricular size is normal.  3. The mitral valve is normal in structure. Trivial mitral valve regurgitation.  4. The aortic valve is tricuspid. Aortic valve regurgitation is not visualized. Mild aortic valve sclerosis is present, with no evidence of aortic valve stenosis.  5. The inferior vena cava is dilated in size with >50% respiratory variability, suggesting right atrial pressure of 8 mmHg. FINDINGS  Left Ventricle: Mild distal anterior and apical hypokinesis. Consider limited echo with Definity to further define wall motion. Left ventricular ejection fraction, by estimation, is 50 to 55%. The left ventricle has low normal function. The left ventricle has no regional wall motion abnormalities. The left ventricular internal cavity size was normal in size. There is mild left  ventricular hypertrophy. Indeterminate diastolic filling due to E-A fusion. Right Ventricle: The right ventricular size is normal. Right vetricular wall thickness was not assessed. Right ventricular systolic function is normal. Left Atrium: Left atrial size was normal in size. Right Atrium: Right atrial size was normal in size. Pericardium: Trivial pericardial effusion is present. Mitral Valve: The mitral valve is normal in structure. Trivial mitral valve regurgitation. Tricuspid Valve: The tricuspid valve is normal in structure. Tricuspid valve regurgitation is trivial. Aortic Valve: The aortic valve is tricuspid. Aortic valve regurgitation is not visualized. Mild aortic valve sclerosis is present, with no evidence of aortic valve stenosis. Pulmonic Valve: The pulmonic valve was not well visualized. Pulmonic valve regurgitation is not visualized. No evidence of pulmonic stenosis. Aorta: The aortic root is normal in size and structure. Venous: The inferior vena cava is dilated in size with greater than 50% respiratory variability, suggesting right atrial pressure of 8 mmHg. IAS/Shunts: No atrial level shunt detected by color flow Doppler.  LEFT VENTRICLE PLAX 2D LVIDd:         4.30 cm LVIDs:         3.00 cm LV PW:         1.20 cm LV IVS:        1.30 cm LVOT diam:     2.10 cm LV SV:         65 LV SV Index:   30 LVOT Area:     3.46 cm  RIGHT VENTRICLE RV S prime:     11.20 cm/s TAPSE (M-mode): 1.8 cm LEFT ATRIUM             Index       RIGHT ATRIUM           Index LA diam:        3.00 cm 1.35 cm/m  RA Area:     13.00 cm LA Vol (A2C):   56.3 ml 25.41 ml/m RA Volume:   30.20 ml  13.63 ml/m LA Vol (A4C):   51.8 ml 23.38 ml/m LA Biplane Vol: 54.3 ml 24.51 ml/m  AORTIC VALVE LVOT Vmax:   85.70 cm/s LVOT Vmean:  53.200 cm/s LVOT VTI:    0.189 m  AORTA Ao Root  diam: 3.70 cm MITRAL VALVE MV Area (PHT): 4.33 cm     SHUNTS MV Decel Time: 175 msec     Systemic VTI:  0.19 m MV E velocity: 117.00 cm/s  Systemic Diam: 2.10  cm Dorris Carnes MD Electronically signed by Dorris Carnes MD Signature Date/Time: 07/10/2021/5:57:45 PM    Final      ASSESSMENT:  1.  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  Basis for CKD is hypertension associated arteriosclerosis with arterionephrosclerosis. - Bone marrow biopsy on 02/21/2021 with slightly hypercellular bone marrow with trilineage hematopoiesis.  Plasma cells increased in number representing 11% of all cells with kappa light chain restriction. - Myeloma FISH panel with no evidence of T p53.  Plasma cell enrichment yielded limited cellularity, therefore only tested for T p53 probes at. - Chromosome analysis 45, X,- Y (8)/46, XY (12) - Labs on 02/01/2021-M spike 2 g, kappa light chain 794, free light chain ratio 49.97.  Beta-2 microglobulin 5.5, albumin 3.5.  LDH normal. - PET scan on 03/08/2021 with no evidence of active myeloma.  No soft tissue plasmacytoma. - 24-hour urine total protein 2.2 g on 03/13/2021.  Immunofixation positive for IgG kappa. - CyBorD started on 03/14/2021.   2.  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  Smoked 2 packs/day for 32 years and quit smoking cigarettes and vaping now. -Mother had small cell lung cancer.   3.  CKD: -He has stage V CKD thought to be secondary to hypertension. -Renal ultrasound on 12/19/2020 was negative for obstructive uropathy and findings were consistent with chronic medical renal disease.  Bilateral kidney cysts were seen. -He has 2.7 g of proteinuria on 24-hour urine. -Kidney biopsy was done on 01/25/2021.   PLAN:  1.  Stage II, standard risk IgG kappa multiple myeloma: - He completed 4 cycles of CyBorD. - Myeloma labs on 07/04/2021 with M spike 1.1 g slightly increased from 1 g previously.  Free light chain ratio has increased to 20.3. - Reviewed labs from  today which showed increased creatinine of 5.26, most likely from torsemide. - CBC has improved hemoglobin 12.3. - Myeloma labs from today are pending. - We will reevaluate him in 2 weeks.  If there is worsening of myeloma labs, will consider decreasing Velcade dose.  If renal Function is more stable, will also consider dose reduced lenalidomide instead of Cytoxan.   2.  Smoking history: - CT chest on 02/15/2021 was lung RADS 1.   3.  ID prophylaxis: - Continue aspirin for thromboprophylaxis.  Continue acyclovir twice daily.   4.  Myeloma bone disease: - Last denosumab on 06/27/2021. - Will restart denosumab once therapy for myeloma resumed.  5.  Lower extremity swelling: - Lasix did not help.  His weight has increased to 255.  He has 2+ edema in the legs. - Dr. [name] changed fluid pills to torsemide 100 mg daily.  6.  Peripheral neuropathy: - Continue gabapentin 300 mg twice daily which is helping with the neuropathic pain. - We will consider restarting Velcade at a lower dose.  7.  Low back pain: - He reports low back pain which is new.  He took Tylenol which is not helping. - He will be started on oxycodone 5 mg twice daily as needed.   Orders placed this encounter:  No orders of the defined types were placed in this encounter.  Derek Jack, MD [facility] [phone]   I, Thana Ates, am acting as a scribe for Dr. Derek Jack.  I, Derek Jack MD, have reviewed the above documentation for accuracy and completeness, and I agree with the above.

## 2021-08-08 ENCOUNTER — Other Ambulatory Visit: Payer: Self-pay

## 2021-08-08 ENCOUNTER — Inpatient Hospital Stay (HOSPITAL_COMMUNITY): Payer: Medicare HMO | Attending: Hematology

## 2021-08-08 ENCOUNTER — Inpatient Hospital Stay (HOSPITAL_COMMUNITY): Payer: Medicare HMO

## 2021-08-08 ENCOUNTER — Other Ambulatory Visit (HOSPITAL_COMMUNITY): Payer: Self-pay | Admitting: *Deleted

## 2021-08-08 ENCOUNTER — Inpatient Hospital Stay (HOSPITAL_COMMUNITY): Payer: Medicare HMO | Admitting: Hematology

## 2021-08-08 VITALS — BP 115/74 | HR 93 | Temp 96.7°F | Resp 18 | Wt 255.6 lb

## 2021-08-08 DIAGNOSIS — Z5112 Encounter for antineoplastic immunotherapy: Secondary | ICD-10-CM | POA: Diagnosis present

## 2021-08-08 DIAGNOSIS — D472 Monoclonal gammopathy: Secondary | ICD-10-CM | POA: Diagnosis not present

## 2021-08-08 DIAGNOSIS — C9000 Multiple myeloma not having achieved remission: Secondary | ICD-10-CM | POA: Diagnosis present

## 2021-08-08 LAB — CBC WITH DIFFERENTIAL/PLATELET
Abs Immature Granulocytes: 0.03 10*3/uL (ref 0.00–0.07)
Basophils Absolute: 0.1 10*3/uL (ref 0.0–0.1)
Basophils Relative: 1 %
Eosinophils Absolute: 0.3 10*3/uL (ref 0.0–0.5)
Eosinophils Relative: 4 %
HCT: 39.1 % (ref 39.0–52.0)
Hemoglobin: 12.3 g/dL — ABNORMAL LOW (ref 13.0–17.0)
Immature Granulocytes: 0 %
Lymphocytes Relative: 34 %
Lymphs Abs: 2.7 10*3/uL (ref 0.7–4.0)
MCH: 33.7 pg (ref 26.0–34.0)
MCHC: 31.5 g/dL (ref 30.0–36.0)
MCV: 107.1 fL — ABNORMAL HIGH (ref 80.0–100.0)
Monocytes Absolute: 1.5 10*3/uL — ABNORMAL HIGH (ref 0.1–1.0)
Monocytes Relative: 19 %
Neutro Abs: 3.4 10*3/uL (ref 1.7–7.7)
Neutrophils Relative %: 42 %
Platelets: 205 10*3/uL (ref 150–400)
RBC: 3.65 MIL/uL — ABNORMAL LOW (ref 4.22–5.81)
RDW: 13 % (ref 11.5–15.5)
WBC: 8 10*3/uL (ref 4.0–10.5)
nRBC: 0 % (ref 0.0–0.2)

## 2021-08-08 LAB — COMPREHENSIVE METABOLIC PANEL
ALT: 17 U/L (ref 0–44)
AST: 16 U/L (ref 15–41)
Albumin: 4.1 g/dL (ref 3.5–5.0)
Alkaline Phosphatase: 29 U/L — ABNORMAL LOW (ref 38–126)
Anion gap: 13 (ref 5–15)
BUN: 56 mg/dL — ABNORMAL HIGH (ref 8–23)
CO2: 26 mmol/L (ref 22–32)
Calcium: 9.2 mg/dL (ref 8.9–10.3)
Chloride: 99 mmol/L (ref 98–111)
Creatinine, Ser: 5.26 mg/dL — ABNORMAL HIGH (ref 0.61–1.24)
GFR, Estimated: 11 mL/min — ABNORMAL LOW (ref >60)
Glucose, Bld: 110 mg/dL — ABNORMAL HIGH (ref 70–99)
Potassium: 3.5 mmol/L (ref 3.5–5.1)
Sodium: 138 mmol/L (ref 135–145)
Total Bilirubin: 0.7 mg/dL (ref 0.3–1.2)
Total Protein: 8.1 g/dL (ref 6.5–8.1)

## 2021-08-08 LAB — LACTATE DEHYDROGENASE: LDH: 196 U/L — ABNORMAL HIGH (ref 98–192)

## 2021-08-08 LAB — MAGNESIUM: Magnesium: 2.3 mg/dL (ref 1.7–2.4)

## 2021-08-08 MED ORDER — GABAPENTIN 300 MG PO CAPS: 300.0000 mg | ORAL_CAPSULE | Freq: Two times a day (BID) | ORAL | 1 refills | Status: DC

## 2021-08-08 MED ORDER — OXYCODONE HCL 5 MG PO TABS: 5.0000 mg | ORAL_TABLET | Freq: Two times a day (BID) | ORAL | 0 refills | Status: DC | PRN

## 2021-08-08 NOTE — Patient Instructions (Signed)
[place] at [facility] Discharge Instructions  You were seen and examined by Dr. Delton Coombes. No treatment today. Prescription sent for oxycodone 5 mg twice a day as needed. Prescription sent for gabapentin for neuropathy. Return in 2 weeks as scheduled.    Thank you for choosing [place] at [facility] to provide your oncology and hematology care.  To afford each patient quality time with our provider, please arrive at least 15 minutes before your scheduled appointment time.   If you have a lab appointment with the [facility] please come in thru the Main Entrance and check in at the main information desk.  You need to re-schedule your appointment should you arrive 10 or more minutes late.  We strive to give you quality time with our providers, and arriving late affects you and other patients whose appointments are after yours.  Also, if you no show three or more times for appointments you may be dismissed from the clinic at the providers discretion.     Again, thank you for choosing [facility].  Our hope is that these requests will decrease the amount of time that you wait before being seen by our physicians.       _____________________________________________________________  Should you have questions after your visit to [facility], please contact our office at [phone] and follow the prompts.  Our office hours are 8:00 a.m. and 4:30 p.m. Monday - Friday.  Please note that voicemails left after 4:00 p.m. may not be returned until the following business day.  We are closed weekends and major holidays.  You do have access to a nurse 24-7, just call the main number to the clinic [phone] and do not press any options, hold on the line and a nurse will answer the phone.    For prescription refill requests, have your pharmacy contact our office and allow 72 hours.    Due to Covid, you will need to wear a mask  upon entering the hospital. If you do not have a mask, a mask will be given to you at the Main Entrance upon arrival. For doctor visits, patients may have 1 support person age 24 or older with them. For treatment visits, patients can not have anyone with them due to social distancing guidelines and our immunocompromised population.

## 2021-08-08 NOTE — Progress Notes (Signed)
Patient presents today for treatment and follow up visit with Dr. Delton Coombes.   Creatinine today 5.26. Message received from [place] / Dr. Delton Coombes NO treatment today. Patient will return in 2 weeks for labs and possible treatment.

## 2021-08-09 LAB — KAPPA/LAMBDA LIGHT CHAINS
Kappa free light chain: 375.4 mg/L — ABNORMAL HIGH (ref 3.3–19.4)
Kappa, lambda light chain ratio: 28.44 — ABNORMAL HIGH (ref 0.26–1.65)
Lambda free light chains: 13.2 mg/L (ref 5.7–26.3)

## 2021-08-10 LAB — PROTEIN ELECTROPHORESIS, SERUM
A/G Ratio: 1.1 (ref 0.7–1.7)
Albumin ELP: 4.2 g/dL (ref 2.9–4.4)
Alpha-1-Globulin: 0.3 g/dL (ref 0.0–0.4)
Alpha-2-Globulin: 0.9 g/dL (ref 0.4–1.0)
Beta Globulin: 0.9 g/dL (ref 0.7–1.3)
Gamma Globulin: 1.8 g/dL (ref 0.4–1.8)
Globulin, Total: 3.8 g/dL (ref 2.2–3.9)
M-Spike, %: 1.2 g/dL — ABNORMAL HIGH
Total Protein ELP: 8 g/dL (ref 6.0–8.5)

## 2021-08-13 ENCOUNTER — Ambulatory Visit: Payer: Medicare HMO | Admitting: Family Medicine

## 2021-08-15 DIAGNOSIS — C9000 Multiple myeloma not having achieved remission: Secondary | ICD-10-CM | POA: Diagnosis not present

## 2021-08-15 DIAGNOSIS — D631 Anemia in chronic kidney disease: Secondary | ICD-10-CM | POA: Diagnosis not present

## 2021-08-15 DIAGNOSIS — N185 Chronic kidney disease, stage 5: Secondary | ICD-10-CM | POA: Diagnosis not present

## 2021-08-15 DIAGNOSIS — R600 Localized edema: Secondary | ICD-10-CM | POA: Diagnosis not present

## 2021-08-15 DIAGNOSIS — I120 Hypertensive chronic kidney disease with stage 5 chronic kidney disease or end stage renal disease: Secondary | ICD-10-CM | POA: Diagnosis not present

## 2021-08-15 DIAGNOSIS — N08 Glomerular disorders in diseases classified elsewhere: Secondary | ICD-10-CM | POA: Diagnosis not present

## 2021-08-15 DIAGNOSIS — E211 Secondary hyperparathyroidism, not elsewhere classified: Secondary | ICD-10-CM | POA: Diagnosis not present

## 2021-08-15 DIAGNOSIS — R809 Proteinuria, unspecified: Secondary | ICD-10-CM | POA: Diagnosis not present

## 2021-08-15 DIAGNOSIS — N189 Chronic kidney disease, unspecified: Secondary | ICD-10-CM | POA: Diagnosis not present

## 2021-08-16 ENCOUNTER — Other Ambulatory Visit (HOSPITAL_COMMUNITY): Payer: Self-pay | Admitting: Nephrology

## 2021-08-16 ENCOUNTER — Ambulatory Visit (HOSPITAL_COMMUNITY): Payer: Medicare HMO

## 2021-08-16 DIAGNOSIS — I120 Hypertensive chronic kidney disease with stage 5 chronic kidney disease or end stage renal disease: Secondary | ICD-10-CM | POA: Diagnosis not present

## 2021-08-16 DIAGNOSIS — N08 Glomerular disorders in diseases classified elsewhere: Secondary | ICD-10-CM | POA: Diagnosis not present

## 2021-08-16 DIAGNOSIS — C9000 Multiple myeloma not having achieved remission: Secondary | ICD-10-CM | POA: Diagnosis not present

## 2021-08-16 DIAGNOSIS — D631 Anemia in chronic kidney disease: Secondary | ICD-10-CM | POA: Diagnosis not present

## 2021-08-16 DIAGNOSIS — R809 Proteinuria, unspecified: Secondary | ICD-10-CM | POA: Diagnosis not present

## 2021-08-16 DIAGNOSIS — N185 Chronic kidney disease, stage 5: Secondary | ICD-10-CM | POA: Diagnosis not present

## 2021-08-16 DIAGNOSIS — R600 Localized edema: Secondary | ICD-10-CM | POA: Diagnosis not present

## 2021-08-16 DIAGNOSIS — N189 Chronic kidney disease, unspecified: Secondary | ICD-10-CM | POA: Diagnosis not present

## 2021-08-16 DIAGNOSIS — E211 Secondary hyperparathyroidism, not elsewhere classified: Secondary | ICD-10-CM | POA: Diagnosis not present

## 2021-08-16 LAB — IMMUNOFIXATION ELECTROPHORESIS
IgA: 31 mg/dL — ABNORMAL LOW (ref 61–437)
IgG (Immunoglobin G), Serum: 1947 mg/dL — ABNORMAL HIGH (ref 603–1613)
IgM (Immunoglobulin M), Srm: 10 mg/dL — ABNORMAL LOW (ref 20–172)
Total Protein ELP: 11.7 g/dL — ABNORMAL HIGH (ref 6.0–8.5)

## 2021-08-17 ENCOUNTER — Other Ambulatory Visit: Payer: Self-pay

## 2021-08-17 ENCOUNTER — Other Ambulatory Visit (HOSPITAL_COMMUNITY): Payer: Self-pay | Admitting: Nephrology

## 2021-08-17 ENCOUNTER — Ambulatory Visit (HOSPITAL_COMMUNITY)
Admission: RE | Admit: 2021-08-17 | Discharge: 2021-08-17 | Disposition: A | Discharge status: Home / Self Care | Payer: Medicare HMO | Source: Ambulatory Visit | Attending: Nephrology | Admitting: Nephrology

## 2021-08-17 DIAGNOSIS — N186 End stage renal disease: Secondary | ICD-10-CM | POA: Diagnosis not present

## 2021-08-17 DIAGNOSIS — C9000 Multiple myeloma not having achieved remission: Secondary | ICD-10-CM | POA: Diagnosis not present

## 2021-08-17 DIAGNOSIS — Z79899 Other long term (current) drug therapy: Secondary | ICD-10-CM | POA: Insufficient documentation

## 2021-08-17 DIAGNOSIS — N189 Chronic kidney disease, unspecified: Secondary | ICD-10-CM | POA: Diagnosis not present

## 2021-08-17 DIAGNOSIS — R69 Illness, unspecified: Secondary | ICD-10-CM | POA: Diagnosis not present

## 2021-08-17 DIAGNOSIS — N185 Chronic kidney disease, stage 5: Secondary | ICD-10-CM

## 2021-08-17 DIAGNOSIS — Z7982 Long term (current) use of aspirin: Secondary | ICD-10-CM | POA: Insufficient documentation

## 2021-08-17 DIAGNOSIS — E785 Hyperlipidemia, unspecified: Secondary | ICD-10-CM | POA: Diagnosis not present

## 2021-08-17 DIAGNOSIS — F17210 Nicotine dependence, cigarettes, uncomplicated: Secondary | ICD-10-CM | POA: Insufficient documentation

## 2021-08-17 DIAGNOSIS — Z4901 Encounter for fitting and adjustment of extracorporeal dialysis catheter: Secondary | ICD-10-CM | POA: Diagnosis not present

## 2021-08-17 DIAGNOSIS — I129 Hypertensive chronic kidney disease with stage 1 through stage 4 chronic kidney disease, or unspecified chronic kidney disease: Secondary | ICD-10-CM | POA: Insufficient documentation

## 2021-08-17 DIAGNOSIS — T82590A Other mechanical complication of surgically created arteriovenous fistula, initial encounter: Secondary | ICD-10-CM | POA: Diagnosis not present

## 2021-08-17 HISTORY — PX: IR FLUORO GUIDE CV LINE LEFT: IMG2282

## 2021-08-17 HISTORY — PX: IR US GUIDE VASC ACCESS LEFT: IMG2389

## 2021-08-17 MED ORDER — FENTANYL CITRATE (PF) 100 MCG/2ML IJ SOLN
INTRAMUSCULAR | Status: AC
  Filled 2021-08-17: qty 2

## 2021-08-17 MED ORDER — SODIUM CHLORIDE 0.9 % IV SOLN
INTRAVENOUS | Status: DC | PRN
  Administered 2021-08-17: 10 mL/h via INTRAVENOUS

## 2021-08-17 MED ORDER — HEPARIN SODIUM (PORCINE) 1000 UNIT/ML IJ SOLN
INTRAMUSCULAR | Status: AC
  Administered 2021-08-17: 4.2 mL
  Filled 2021-08-17: qty 1

## 2021-08-17 MED ORDER — CEFAZOLIN SODIUM-DEXTROSE 2-4 GM/100ML-% IV SOLN
INTRAVENOUS | Status: AC
  Filled 2021-08-17: qty 100

## 2021-08-17 MED ORDER — LIDOCAINE-EPINEPHRINE 1 %-1:100000 IJ SOLN
INTRAMUSCULAR | Status: AC
  Administered 2021-08-17: 10 mL via SUBCUTANEOUS
  Filled 2021-08-17: qty 1

## 2021-08-17 MED ORDER — MIDAZOLAM HCL 2 MG/2ML IJ SOLN
INTRAMUSCULAR | Status: AC
  Filled 2021-08-17: qty 2

## 2021-08-17 MED ORDER — CEFAZOLIN SODIUM-DEXTROSE 2-4 GM/100ML-% IV SOLN
2.0000 g | INTRAVENOUS | Status: AC
  Administered 2021-08-17: 2 g via INTRAVENOUS

## 2021-08-17 MED ORDER — MIDAZOLAM HCL 2 MG/2ML IJ SOLN
INTRAMUSCULAR | Status: DC | PRN
  Administered 2021-08-17: .5 mg via INTRAVENOUS
  Administered 2021-08-17: 1 mg via INTRAVENOUS

## 2021-08-17 MED ORDER — FENTANYL CITRATE (PF) 100 MCG/2ML IJ SOLN
INTRAMUSCULAR | Status: DC | PRN
  Administered 2021-08-17 (×3): 25 ug via INTRAVENOUS

## 2021-08-17 NOTE — H&P (Signed)
Chief Complaint: Hemodialysis access. Request is for tunneled HD catheter placement.  Referring Physician(s): Bhutani,Manpreet S  Supervising Physician: Sandi Mariscal  Patient Status: [facility] - Out-pt  History of Present Illness: Ernest Barnett is a 65 y.o. male Smoker. History of CVA, GERD, HLD, HTN, multiple myeloma, MGUS, CKD s/p right brachiocephalic AVF placed on 7.4.08.  Per note from 5.16.22 from Dr. Donnetta Hutching there was some concern of possible competing branches in the mid distal forearm that may need ligating.  Patient states that his AVF has not matured yet. Team is requesting a tunneled HD catheter for outpatient dialysis access.    Currently  endorses bilateral lower extremity swelling and left flank pain that he associates with his CKD. Patient alert and laying in bed, calm and comfortable. Denies any fevers, headache, chest pain, SOB, cough, abdominal pain, nausea, vomiting or bleeding. Return precautions and treatment recommendations and follow-up discussed with the patient who is agreeable with the plan.   Past Medical History:  Diagnosis Date   Chronic kidney disease    COPD (chronic obstructive pulmonary disease) (HCC)    CVA (cerebral vascular accident) ([place]) 2013   Cystoid macular edema of left eye 03/07/2021   GERD (gastroesophageal reflux disease)    HOH (hard of hearing)    Hypercholesteremia    Hypertension    Left epiretinal membrane 03/07/2021   Pseudophakia 03/07/2021   Stroke ([place]) 03/24/2012   left sided weakness   Vitamin D deficiency     Past Surgical History:  Procedure Laterality Date   AV FISTULA PLACEMENT Right 02/08/2021   Procedure: RIGHT ARM ARTERIOVENOUS (AV) FISTULA CREATION;  Surgeon: [name], MD;  Location: [facility] ORS;  Service: Vascular;  Laterality: Right;   BIOPSY  11/12/2018   Procedure: BIOPSY;  Surgeon: Rogene Houston, MD;  Location: [facility] ENDO SU[facility];  Service: Endoscopy;;  colon   BIOPSY  05/10/2020   Procedure: BIOPSY;  Surgeon: Rogene Houston, MD;  Location: [facility];  Service: Endoscopy;;  esophagus   CATARACT EXTRACTION     right eye   CATARACT EXTRACTION W/PHACO  10/08/2012   Procedure: CATARACT EXTRACTION PHACO AND INTRAOCULAR LENS PLACEMENT ([place]);  Surgeon: Tonny Branch, MD;  Location: [facility] ORS;  Service: Ophthalmology;  Laterality: Left;  CDE:6.64   CHOLECYSTECTOMY     [place]   COLONOSCOPY N/A 11/12/2018   Procedure: COLONOSCOPY;  Surgeon: Rogene Houston, MD;  Location: [facility];  Service: Endoscopy;  Laterality: N/A;  1030   ELBOW FRACTURE SURGERY     left   ESOPHAGEAL DILATION N/A 11/12/2018   Procedure: ESOPHAGEAL DILATION;  Surgeon: Rogene Houston, MD;  Location: [facility];  Service: Endoscopy;  Laterality: N/A;   ESOPHAGEAL DILATION N/A 05/10/2020   Procedure: ESOPHAGEAL DILATION;  Surgeon: Rogene Houston, MD;  Location: [facility];  Service: Endoscopy;  Laterality: N/A;   ESOPHAGOGASTRODUODENOSCOPY N/A 11/12/2018   Procedure: ESOPHAGOGASTRODUODENOSCOPY (EGD);  Surgeon: Rogene Houston, MD;  [facility]: [facility];  Service: Endoscopy;  Laterality: N/A;   ESOPHAGOGASTRODUODENOSCOPY N/A 05/10/2020   Procedure: ESOPHAGOGASTRODUODENOSCOPY (EGD);  Surgeon: Rogene Houston, MD;  [facility]: [facility];  Service: Endoscopy;  Laterality: N/A;  210   HERNIA REPAIR     right inguinal   HYDROCELE EXCISION / REPAIR     POLYPECTOMY  11/12/2018   Procedure: POLYPECTOMY;  Surgeon: Rogene Houston, MD;  Location: [facility];  Service: Endoscopy;;  colon    RETINAL DETACHMENT SURGERY Left 2019  SPLENECTOMY, TOTAL     TONSILLECTOMY     VOCAL CORD INJECTION     removal of polyp-2005    Allergies: Patient has no known allergies.  Medications: Prior to Admission medications   Medication Sig Start Date End Date Taking? Authorizing Provider  albuterol (VENTOLIN HFA) 108 (90 Base) MCG/ACT inhaler Inhale 2 puffs into the lungs every 6 (six) hours as needed for wheezing or shortness of breath. Patient not  taking: Reported on 08/08/2021 06/25/21   Noreene Larsson, NP  amLODipine (NORVASC) 5 MG tablet Take 5 mg by mouth daily. 08/30/20   [provider]  aspirin EC 81 MG tablet Take 1 tablet (81 mg total) by mouth daily. 05/13/20   Rehman, Mechele Dawley, MD  bortezomib IV (VELCADE) 3.5 MG injection Inject 1.5 mg/m2 into the vein once a week. 03/14/21   [provider]  budesonide-formoterol (SYMBICORT) 80-4.5 MCG/ACT inhaler Inhale 2 puffs into the lungs 2 (two) times daily as needed.    [provider]  calcitRIOL (ROCALTROL) 0.25 MCG capsule Take by mouth. 08/01/21 08/01/22  [provider]  Calcium Carbonate-Vit D-Min (CALCIUM 1200 PO) Take 1-2 tablets by mouth daily. 05/23/21   [provider]  cholecalciferol (VITAMIN D3) 25 MCG (1000 UNIT) tablet Take 1,000 Units by mouth daily.    [provider]  CYCLOPHOSPHAMIDE IV Inject 300 mg/m2 into the vein once a week. 03/14/21   [provider]  dexamethasone 40 mg in sodium chloride 0.9 % 50 mL Inject 40 mg into the vein once a week. 03/14/21   [provider]  furosemide (LASIX) 20 MG tablet Take 20 mg by mouth 2 (two) times daily.    [provider]  gabapentin (NEURONTIN) 300 MG capsule Take 1 capsule (300 mg total) by mouth 2 (two) times daily. 08/08/21   Derek Jack, MD  Multiple Vitamin (MULTIVITAMIN) tablet Take by mouth.    [provider]  Omega-3 Fatty Acids (FISH OIL) 1000 MG CAPS Take 1,000 mg by mouth in the morning, at noon, and at bedtime.     [provider]  oxyCODONE (OXY IR/ROXICODONE) 5 MG immediate release tablet Take 1 tablet (5 mg total) by mouth 2 (two) times daily as needed for severe pain. 08/08/21   Derek Jack, MD  pantoprazole (PROTONIX) 40 MG tablet Take 1 tablet (40 mg total) by mouth 2 (two) times daily. 12/05/20   Rehman, Mechele Dawley, MD  polyethylene glycol (MIRALAX / GLYCOLAX) packet Take 17 g by mouth daily. Patient states  that he takes as needed. Patient not taking: Reported on 08/08/2021 03/31/18   Rogene Houston, MD  pravastatin (PRAVACHOL) 80 MG tablet Take 1 tablet (80 mg total) by mouth daily. 06/25/21   Noreene Larsson, NP  sodium bicarbonate 650 MG tablet Take 650 mg by mouth 3 (three) times daily.    [provider]  tamsulosin (FLOMAX) 0.4 MG CAPS capsule Take 0.4 mg by mouth daily.     [provider]  tiZANidine (ZANAFLEX) 2 MG tablet Take 1 tablet (2 mg total) by mouth every 6 (six) hours as needed for muscle spasms. Patient not taking: Reported on 08/08/2021 06/25/21   Noreene Larsson, NP  torsemide [facility]) 100 MG tablet Take by mouth. 07/27/21   [provider]     No family history on file.  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  Tobacco Use   Smoking status: Former    Packs/day: 3.00    Years: 30.00    Pack years: 90.00    Types: Cigarettes   Smokeless tobacco: Current   Tobacco comments:    Currently vape  Vaping Use   Vaping Use: Every day   Substances: Nicotine  Substance and Sexual Activity   Alcohol use: Yes    Alcohol/week: 1.0 - 2.0 standard drink    Types: 1 - 2 Cans of beer per week    Comment: Occasionally   Drug use: No   Sexual activity: Not Currently    Birth control/protection: Abstinence, None  Other Topics Concern   Not on file  Social History Narrative   2 children, both nearby   Social Determinants of Health   Financial Resource Strain: Low Risk    Difficulty of Paying Living Expenses: Not hard at all  Food Insecurity: No Food Insecurity   Worried About Charity fundraiser in the Last Year: Never true   Arboriculturist in the Last Year: Never true  Transportation  Needs: No Transportation Needs   Lack of Transportation (Medical): No   Lack of Transportation (Non-Medical): No  Physical Activity: Insufficiently Active   Days of Exercise per Week: 3 days   Minutes of Exercise per Session: 30 min  Stress: No Stress Concern Present   Feeling of Stress : Only a little  Social Connections: Socially Isolated   Frequency of Communication with Friends and Family: More than three times a week   Frequency of Social Gatherings with Friends and Family: More than three times a week   Attends Religious Services: Never   Marine scientist or Organizations: No   Attends Archivist Meetings: Never   Marital Status: Divorced    Review of Systems: A 12 point ROS discussed and pertinent positives are indicated in the HPI above.  All other systems are negative.  Review of Systems  Constitutional:  Negative for fever.  HENT:  Negative for congestion.   Respiratory:  Negative for cough and shortness of breath.   Cardiovascular:  Negative for chest pain.  Gastrointestinal:  Negative for abdominal pain.  Musculoskeletal:  Positive for back pain (left lower back he states is related to CKD).       Bilateral lower extremity swelling.  Neurological:  Negative for headaches.  Psychiatric/Behavioral:  Negative for behavioral problems and confusion.    Vital Signs: BP 127/81   Pulse 97   Temp 98.1 F (36.7 C) (Oral)   Resp 18   Ht '5\' 7"'  (1.702 m)   Wt 258 lb (117 kg)   SpO2 96%   BMI 40.41 kg/m   Physical Exam Vitals and nursing note reviewed.  Constitutional:      Appearance: He is well-developed.  HENT:     Head: Normocephalic.  Cardiovascular:     Rate and Rhythm: Normal rate and regular rhythm.     Heart sounds: Normal heart sounds.  Pulmonary:     Effort: Pulmonary effort is normal.     Breath sounds: Normal breath sounds.  Musculoskeletal:        General: Normal range of motion.     Cervical back: Normal range of motion.  Skin:  General: Skin is dry.  Neurological:     Mental Status: He is alert and oriented to person, place, and time.    Imaging: No results found.  Labs:  CBC: Recent Labs    06/27/21 1232 07/04/21 1224 07/11/21 1245 08/08/21 0804  WBC 6.6 6.5 6.9 8.0  HGB 10.2* 10.0* 10.3* 12.3*  HCT 30.6* 29.7* 31.5* 39.1  PLT 175 168 184 205    COAGS: Recent Labs    01/25/21 0654  INR 1.1    BMP: Recent Labs    06/27/21 1232 07/04/21 1224 07/11/21 1245 08/08/21 0804  NA 136 135 139 138  K 4.1 4.4 4.4 3.5  CL 108 106 110 99  CO2 22 21* 22 26  GLUCOSE 109* 118* 105* 110*  BUN 32* 42* 35* 56*  CALCIUM 8.4* 8.7* 9.1 9.2  CREATININE 3.20* 3.75* 3.57* 5.26*  GFRNONAA 21* 17* 18* 11*    LIVER FUNCTION TESTS: Recent Labs    06/27/21 1232 07/04/21 1224 07/11/21 1245 08/08/21 0804  BILITOT 0.7 0.5 0.7 0.7  AST '19 17 17 16  ' ALT '25 21 22 17  ' ALKPHOS 31* 31* 31* 29*  PROT 7.4 7.5 7.4 8.1  ALBUMIN 3.6 3.8 3.8 4.1      Assessment and Plan:  65 y.o. male outpatient. Smoker. History of CVA, GERD, HLD, HTN, multiple myeloma, MGUS, CKD s/p right brachiocephalic AVF placed on 1.6.10.  Per note from 5.16.22 from Dr. Donnetta Hutching there was some concern of possible competing branches in the mid distal forearm that may need ligating.  Patient states that his AVF has not matured yet. Team is requesting a tunneled HD catheter for outpatient dialysis access.    Labs pending. All medications are within acceptable parameters. NKDA. Patient has been NPO since midnight.   Risks and benefits discussed with the patient including, but not limited to bleeding, infection, vascular injury, pneumothorax which may require chest tube placement, air embolism or even death  All of the patient's questions were answered, patient is agreeable to proceed. Consent signed and in chart.   Thank you for this interesting consult.  I greatly enjoyed meeting Ernest Barnett and look forward to participating in their care.   A copy of this report was sent to the requesting provider on this date.  Electronically Signed: Jacqualine Mau, NP 08/17/2021, 8:30 AM   I spent a total of  30 Minutes   in face to face in clinical consultation, greater than 50% of which was counseling/coordinating care for tunneled HD catheter placement

## 2021-08-17 NOTE — Procedures (Signed)
Pre-procedure Diagnosis: ESRD Post-procedure Diagnosis: Same  Successful placement of tunneled HD catheter with tips terminating within the superior aspect of the right atrium.    Complications: None Immediate  EBL: Minimal   The catheter is ready for immediate use.   Jay Emaly Boschert, MD Pager #: [phone]   

## 2021-08-17 NOTE — Progress Notes (Signed)
Pt is very SOB, abdomen large , lower lung sounds were diminished, possible JVD, this has been his norm from a week ago, sat 92% on rm air, encouraged to go to PCP or ER, pt and so voiced understanding, all VSS

## 2021-08-17 NOTE — Sedation Documentation (Signed)
Attempted to give report. Nurse unavailable. Will call back 

## 2021-08-18 DIAGNOSIS — Z20822 Contact with and (suspected) exposure to covid-19: Secondary | ICD-10-CM | POA: Diagnosis not present

## 2021-08-19 NOTE — Progress Notes (Deleted)
Cardiology Office Note  Date: 08/19/2021   ID: Ernest Barnett, DOB December 12, 1955, MRN [id number]  PCP:  Noreene Larsson, NP  Cardiologist:  Carlyle Dolly, MD Electrophysiologist:  None   Chief Complaint: 6-week follow-up  History of Present Illness: Ernest Barnett is a 65 y.o. male with a history of HTN, COPD, CKD stage V, GERD, CVA, HLD.  He was last seen by Dr. Harl Bowie on 07/05/2021 for shortness of breath and recent lower extremity edema.  He had ongoing shortness of breath that had progressed.  He had previously started Lasix.  Nephrology had stopped Norvasc on 05/2021.  His CKD stage V was followed by Dr. [name].  History of prior CVA.  He was compliant with his inhalers for COPD.  Echocardiogram was ordered to evaluate for any underlying structural heart disease.  Diuretic dosage was being deferred to nephrology.  If benign echo may consider Lexiscan.  Echo imaging was not adequate for evaluation of wall motion abnormalities. Echo : Mild distal anterior and apical hypokinesis. (conider limited echo with definity to further define wall motion, LVEF 50-55%.  LV no WMAs. Mild LVH, trivial MR.Limited echo with contrast was ordered.   Past Medical History:  Diagnosis Date   Chronic kidney disease    COPD (chronic obstructive pulmonary disease) (HCC)    CVA (cerebral vascular accident) ([place]) 2013   Cystoid macular edema of left eye 03/07/2021   GERD (gastroesophageal reflux disease)    HOH (hard of hearing)    Hypercholesteremia    Hypertension    Left epiretinal membrane 03/07/2021   Pseudophakia 03/07/2021   Stroke ([place]) 03/24/2012   left sided weakness   Vitamin D deficiency     Past Surgical History:  Procedure Laterality Date   AV FISTULA PLACEMENT Right 02/08/2021   Procedure: RIGHT ARM ARTERIOVENOUS (AV) FISTULA CREATION;  Surgeon: [name], MD;  Location: [facility];  Service: Vascular;  Laterality: Right;   BIOPSY  11/12/2018   Procedure: BIOPSY;  Surgeon: Rogene Houston,  MD;  Location[facility];  Service: Endoscopy;;  colon   BIOPSY  05/10/2020   Procedure: BIOPSY;  Surgeon: Rogene Houston, MD;  [facility];  Service: Endoscopy;;  esophagus   CATARACT EXTRACTION     right eye   CATARACT EXTRACTION W/PHACO  10/08/2012   Procedure: CATARACT EXTRACTION PHACO AND INTRAOCULAR LENS PLACEMENT ([place]);  Surgeon: Tonny Branch, MD;  Location: [facility];  Service: Ophthalmology;  Laterality: Left;  CDE:6.64   CHOLECYSTECTOMY     [place]   COLONOSCOPY N/A 11/12/2018   Procedure: COLONOSCOPY;  Surgeon: Rogene Houston, MD;  [facility];  Service: Endoscopy;  Laterality: N/A;  1030   ELBOW FRACTURE SURGERY     left   ESOPHAGEAL DILATION N/A 11/12/2018   Procedure: ESOPHAGEAL DILATION;  Surgeon: Rogene Houston, MD;  [facility];  Service: Endoscopy;  Laterality: N/A;   ESOPHAGEAL DILATION N/A 05/10/2020   Procedure: ESOPHAGEAL DILATION;  Surgeon: Rogene Houston, MD;  [facility];  Service: Endoscopy;  Laterality: N/A;   ESOPHAGOGASTRODUODENOSCOPY N/A 11/12/2018   Procedure: ESOPHAGOGASTRODUODENOSCOPY (EGD);  Surgeon: Rogene Houston, MD;  [facility];  Service: Endoscopy;  Laterality: N/A;   ESOPHAGOGASTRODUODENOSCOPY N/A 05/10/2020   Procedure: ESOPHAGOGASTRODUODENOSCOPY (EGD);  Surgeon: Rogene Houston, MD;  [facility];  Service: Endoscopy;  Laterality: N/A;  210   HERNIA REPAIR     right inguinal   HYDROCELE  EXCISION / REPAIR     IR FLUORO GUIDE CV LINE LEFT  08/17/2021   IR US GUIDE VASC ACCESS LEFT  08/17/2021   POLYPECTOMY  11/12/2018   Procedure: POLYPECTOMY;  Surgeon: Rogene Houston, MD;  [facility];  Service: Endoscopy;;  colon    RETINAL DETACHMENT SURGERY Left 2019   SPLENECTOMY, TOTAL     TONSILLECTOMY     VOCAL CORD INJECTION     removal of polyp-2005    Current Outpatient Medications  Medication Sig Dispense Refill   albuterol (VENTOLIN HFA) 108 (90 Base) MCG/ACT inhaler  Inhale 2 puffs into the lungs every 6 (six) hours as needed for wheezing or shortness of breath. 8 g 0   amLODipine (NORVASC) 5 MG tablet Take 5 mg by mouth daily.     aspirin EC 81 MG tablet Take 1 tablet (81 mg total) by mouth daily. 30 tablet 11   bortezomib IV (VELCADE) 3.5 MG injection Inject 1.5 mg/m2 into the vein once a week.     budesonide-formoterol (SYMBICORT) 80-4.5 MCG/ACT inhaler Inhale 2 puffs into the lungs 2 (two) times daily as needed.     calcitRIOL (ROCALTROL) 0.25 MCG capsule Take by mouth.     Calcium Carbonate-Vit D-Min (CALCIUM 1200 PO) Take 1-2 tablets by mouth daily.     cholecalciferol (VITAMIN D3) 25 MCG (1000 UNIT) tablet Take 1,000 Units by mouth daily.     CYCLOPHOSPHAMIDE IV Inject 300 mg/m2 into the vein once a week.     dexamethasone 40 mg in sodium chloride 0.9 % 50 mL Inject 40 mg into the vein once a week.     furosemide (LASIX) 20 MG tablet Take 20 mg by mouth 2 (two) times daily.     gabapentin (NEURONTIN) 300 MG capsule Take 1 capsule (300 mg total) by mouth 2 (two) times daily. 60 capsule 1   Multiple Vitamin (MULTIVITAMIN) tablet Take by mouth.     Omega-3 Fatty Acids (FISH OIL) 1000 MG CAPS Take 1,000 mg by mouth in the morning, at noon, and at bedtime.      oxyCODONE (OXY IR/ROXICODONE) 5 MG immediate release tablet Take 1 tablet (5 mg total) by mouth 2 (two) times daily as needed for severe pain. 30 tablet 0   pantoprazole (PROTONIX) 40 MG tablet Take 1 tablet (40 mg total) by mouth 2 (two) times daily. 180 tablet 3   polyethylene glycol (MIRALAX / GLYCOLAX) packet Take 17 g by mouth daily. Patient states that he takes as needed. 30 each 5   pravastatin (PRAVACHOL) 80 MG tablet Take 1 tablet (80 mg total) by mouth daily. 90 tablet 3   sodium bicarbonate 650 MG tablet Take 650 mg by mouth 3 (three) times daily.     tamsulosin (FLOMAX) 0.4 MG CAPS capsule Take 0.4 mg by mouth daily.      tiZANidine (ZANAFLEX) 2 MG tablet Take 1 tablet (2 mg total) by  mouth every 6 (six) hours as needed for muscle spasms. 30 tablet 1   torsemide (DEMADEX) 100 MG tablet Take by mouth.     No current facility-administered medications for this visit.   Facility-Administered Medications Ordered in Other Visits  Medication Dose Route Frequency Provider Last Rate Last Admin   palonosetron (ALOXI) 0.25 MG/5ML injection            Allergies:  Patient has no known allergies.   Social History: The patient  reports that he has quit smoking. His smoking use included cigarettes. He  has a 90.00 pack-year smoking history. He uses smokeless tobacco. He reports current alcohol use of about 1.0 - 2.0 standard drink per week. He reports that he does not use drugs.   Family History: The patient's family history is not on file.   ROS:  Please see the history of present illness. Otherwise, complete review of systems is positive for none.  All other systems are reviewed and negative.   Physical Exam: VS:  There were no vitals taken for this visit., BMI There is no height or weight on file to calculate BMI.  Wt Readings from Last 3 Encounters:  08/17/21 258 lb (117 kg)  08/08/21 255 lb 9.6 oz (115.9 kg)  07/05/21 248 lb (112.5 kg)    General: Patient appears comfortable at rest. HEENT: Conjunctiva and lids normal, oropharynx clear with moist mucosa. Neck: Supple, no elevated JVP or carotid bruits, no thyromegaly. Lungs: Clear to auscultation, nonlabored breathing at rest. Cardiac: Regular rate and rhythm, no S3 or significant systolic murmur, no pericardial rub. Abdomen: Soft, nontender, no hepatomegaly, bowel sounds present, no guarding or rebound. Extremities: No pitting edema, distal pulses 2+. Skin: Warm and dry. Musculoskeletal: No kyphosis. Neuropsychiatric: Alert and oriented x3, affect grossly appropriate.  ECG:  {EKG/Telemetry Strips Reviewed:[phone]}  Recent Labwork: 06/20/2021: TSH 1.200 08/08/2021: ALT 17; AST 16; BUN 56; Creatinine, Ser 5.26;  Hemoglobin 12.3; Magnesium 2.3; Platelets 205; Potassium 3.5; Sodium 138     Component Value Date/Time   CHOL 179 06/20/2021 0836   TRIG 223 (H) 06/20/2021 0836   HDL 34 (L) 06/20/2021 0836   LDLCALC 106 (H) 06/20/2021 0836    Other Studies Reviewed Today:  Echocardiogram 07/10/2021  1. Mild distal anterior and apical hypokinesis. Consider limited echo  with Definity to further define wall motion. . Left ventricular ejection  fraction, by estimation, is 50 to 55%. The left ventricle has low normal  function. The left ventricle has no  regional wall motion abnormalities. There is mild left ventricular  hypertrophy. Indeterminate diastolic filling due to E-A fusion.   2. Right ventricular systolic function is normal. The right ventricular  size is normal.   3. The mitral valve is normal in structure. Trivial mitral valve  regurgitation.   4. The aortic valve is tricuspid. Aortic valve regurgitation is not  visualized. Mild aortic valve sclerosis is present, with no evidence of  aortic valve stenosis.   5. The inferior vena cava is dilated in size with >50% respiratory  variability, suggesting right atrial pressure of 8 mmHg.    Chest CT for lung cancer screening 02/18/2021 IMPRESSION: 1. Lung-RADS 1, negative. Continue annual screening with low-dose chest CT without contrast in 12 months. 2. Aortic atherosclerosis (ICD10-I70.0), coronary artery atherosclerosis and emphysema (ICD10-J43.9). 3. Peripheral predominant areas of interstitial thickening and mild architectural distortion, likely post infectious or inflammatory scarring. Sequelae of prior COVID-19 pneumonia could have this appearance. 4. Mild distal esophageal wall thickening, suggesting esophagitis. The patient has had prior endoscopies and esophageal dilatations. 5. Apparent asymmetry involving the left side of the larynx on the first image of the exam. Correlate with any symptoms of hoarseness to suggest laryngeal  pathology.  NM PET scan  5/52022 IMPRESSION: 1. No evidence of active multiple myeloma on whole-body FDG PET scan. 2. No soft tissue plasmacytoma. 3. No suspicious lytic lesions on CT portion exam.   Assessment and Plan:  1. SOB (shortness of breath)   2. DOE (dyspnea on exertion)   3. Leg swelling  1. SOB (shortness of breath) ***  2. DOE (dyspnea on exertion) ***  3. Leg swelling ***   Medication Adjustments/Labs and Tests Ordered: Current medicines are reviewed at length with the patient today.  Concerns regarding medicines are outlined above.   Disposition: Follow-up with ***  Signed, Levell July, NP 08/19/2021 6:01 PM    [place] [facility], [place], [place] [address] Phone: [phone]; Fax: [phone]

## 2021-08-20 ENCOUNTER — Ambulatory Visit: Payer: Medicare HMO | Admitting: Family Medicine

## 2021-08-20 DIAGNOSIS — M7989 Other specified soft tissue disorders: Secondary | ICD-10-CM

## 2021-08-20 DIAGNOSIS — R0602 Shortness of breath: Secondary | ICD-10-CM

## 2021-08-20 DIAGNOSIS — R0609 Other forms of dyspnea: Secondary | ICD-10-CM

## 2021-08-22 ENCOUNTER — Inpatient Hospital Stay (HOSPITAL_COMMUNITY): Payer: Medicare HMO

## 2021-08-22 ENCOUNTER — Ambulatory Visit (HOSPITAL_COMMUNITY): Payer: Medicare HMO

## 2021-08-22 ENCOUNTER — Ambulatory Visit (HOSPITAL_COMMUNITY): Payer: Medicare HMO | Admitting: Hematology

## 2021-08-22 DIAGNOSIS — R17 Unspecified jaundice: Secondary | ICD-10-CM | POA: Diagnosis not present

## 2021-08-22 DIAGNOSIS — Z992 Dependence on renal dialysis: Secondary | ICD-10-CM | POA: Insufficient documentation

## 2021-08-22 DIAGNOSIS — N186 End stage renal disease: Secondary | ICD-10-CM | POA: Diagnosis not present

## 2021-08-24 DIAGNOSIS — Z992 Dependence on renal dialysis: Secondary | ICD-10-CM | POA: Diagnosis not present

## 2021-08-24 DIAGNOSIS — N186 End stage renal disease: Secondary | ICD-10-CM | POA: Diagnosis not present

## 2021-08-27 DIAGNOSIS — N186 End stage renal disease: Secondary | ICD-10-CM | POA: Diagnosis not present

## 2021-08-27 DIAGNOSIS — Z992 Dependence on renal dialysis: Secondary | ICD-10-CM | POA: Diagnosis not present

## 2021-08-27 DIAGNOSIS — D509 Iron deficiency anemia, unspecified: Secondary | ICD-10-CM | POA: Diagnosis not present

## 2021-08-27 NOTE — Progress Notes (Signed)
[facility], [place] [address]   CLINIC:  Medical Oncology/Hematology  PCP:  Noreene Larsson, NP [address]  [address] / [place] [address] [phone]   REASON FOR VISIT:  Follow-up for multiple myeloma  PRIOR THERAPY: CyBorD  NGS Results: not done  CURRENT THERAPY: Velcade, daratumumab and dexamethasone  BRIEF ONCOLOGIC HISTORY:  Oncology History  Multiple myeloma ([place])  03/06/2021 Initial Diagnosis   Multiple myeloma ([place])   03/06/2021 Cancer Staging   Staging form: Plasma Cell Myeloma and Plasma Cell Disorders, AJCC 8th Edition - Clinical stage from 03/06/2021: RISS Stage II (Beta-2-microglobulin (mg/L): 5.5, Albumin (g/dL): 3.5, ISS: Stage III, High-risk cytogenetics: Absent, LDH: Normal) - Signed by Derek Jack, MD on 03/06/2021 Stage prefix: Initial diagnosis Beta 2 microglobulin range (mg/L): Greater than or equal to 5.5 Albumin range (g/dL): Greater than or equal to 3.5 Cytogenetics: No abnormalities Pretreatment monoclonal protein level in serum (M spike) (g/dL): 2 Pretreatment serum free kappa light chain level (g/L): 794 Pretreatment serum free lambda light chain level (g/L): 15.9    03/14/2021 -  Chemotherapy    Patient is on Treatment Plan: MULTIPLE MYELOMA CYBORD - WEEKLY BORTEZOMIB         CANCER STAGING: Cancer Staging Multiple myeloma ([place]) Staging form: Plasma Cell Myeloma and Plasma Cell Disorders, AJCC 8th Edition - Clinical stage from 03/06/2021: RISS Stage II (Beta-2-microglobulin (mg/L): 5.5, Albumin (g/dL): 3.5, ISS: Stage III, High-risk cytogenetics: Absent, LDH: Normal) - Signed by Derek Jack, MD on 03/06/2021   INTERVAL HISTORY:  [name], a 65 y.o. male, returns for routine follow-up of his MGUS. Ernest Barnett was last seen on 08/08/2021.   Today he reports feeling okay. He reports stable lower back pain which is worsened upon standing and has been present for 10 years;  this is helped by oxycodone and tylenol prn. He continues to have swelling in his legs bilaterally accompanied by knee pain. He reports occasional numbness and tingling in the soles of his feet which had improved with Gabapentin.   REVIEW OF SYSTEMS:  Review of Systems  Constitutional:  Positive for fatigue (depleted). Negative for appetite change.  Respiratory:  Positive for shortness of breath.   Cardiovascular:  Positive for leg swelling.  Musculoskeletal:  Positive for arthralgias (knees).  Neurological:  Positive for numbness (feet).  All other systems reviewed and are negative.  PAST MEDICAL/SURGICAL HISTORY:  Past Medical History:  Diagnosis Date   Chronic kidney disease    COPD (chronic obstructive pulmonary disease) (HCC)    CVA (cerebral vascular accident) ([place]) 2013   Cystoid macular edema of left eye 03/07/2021   GERD (gastroesophageal reflux disease)    HOH (hard of hearing)    Hypercholesteremia    Hypertension    Left epiretinal membrane 03/07/2021   Pseudophakia 03/07/2021   Stroke ([place]) 03/24/2012   left sided weakness   Vitamin D deficiency    Past Surgical History:  Procedure Laterality Date   AV FISTULA PLACEMENT Right 02/08/2021   Procedure: RIGHT ARM ARTERIOVENOUS (AV) FISTULA CREATION;  Surgeon: [name], MD;  Location: [facility];  Service: Vascular;  Laterality: Right;   BIOPSY  11/12/2018   Procedure: BIOPSY;  Surgeon: Rogene Houston, MD;  Location: [facility];  Service: Endoscopy;;  colon   BIOPSY  05/10/2020   Procedure: BIOPSY;  Surgeon: Rogene Houston, MD;  [facility];  Service: Endoscopy;;  esophagus   CATARACT EXTRACTION  right eye   CATARACT EXTRACTION W/PHACO  10/08/2012   Procedure: CATARACT EXTRACTION PHACO AND INTRAOCULAR LENS PLACEMENT (IOC);  Surgeon: Tonny Branch, MD;  Location: [facility];  Service: Ophthalmology;  Laterality: Left;  CDE:6.64   CHOLECYSTECTOMY     [place]   COLONOSCOPY N/A 11/12/2018   Procedure: COLONOSCOPY;  Surgeon:  Rogene Houston, MD;  Location: [facility];  Service: Endoscopy;  Laterality: N/A;  1030   ELBOW FRACTURE SURGERY     left   ESOPHAGEAL DILATION N/A 11/12/2018   Procedure: ESOPHAGEAL DILATION;  Surgeon: Rogene Houston, MD;  [facility];  Service: Endoscopy;  Laterality: N/A;   ESOPHAGEAL DILATION N/A 05/10/2020   Procedure: ESOPHAGEAL DILATION;  Surgeon: Rogene Houston, MD;  [facility];  Service: Endoscopy;  Laterality: N/A;   ESOPHAGOGASTRODUODENOSCOPY N/A 11/12/2018   Procedure: ESOPHAGOGASTRODUODENOSCOPY (EGD);  Surgeon: Rogene Houston, MD;  [facility];  Service: Endoscopy;  Laterality: N/A;   ESOPHAGOGASTRODUODENOSCOPY N/A 05/10/2020   Procedure: ESOPHAGOGASTRODUODENOSCOPY (EGD);  Surgeon: Rogene Houston, MD;  [facility];  Service: Endoscopy;  Laterality: N/A;  210   HERNIA REPAIR     right inguinal   HYDROCELE EXCISION / REPAIR     IR FLUORO GUIDE CV LINE LEFT  08/17/2021   IR US GUIDE VASC ACCESS LEFT  08/17/2021   POLYPECTOMY  11/12/2018   Procedure: POLYPECTOMY;  Surgeon: Rogene Houston, MD;  [facility];  Service: Endoscopy;;  colon    RETINAL DETACHMENT SURGERY Left 2019   SPLENECTOMY, TOTAL     TONSILLECTOMY     VOCAL CORD INJECTION     removal of polyp-2005    SOCIAL HISTORY:  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  Tobacco Use   Smoking status: Former    Packs/day: 3.00    Years: 30.00    Pack years: 90.00    Types: Cigarettes   Smokeless tobacco: Current   Tobacco comments:    Currently vape  Vaping Use   Vaping Use: Every day   Substances: Nicotine  Substance and Sexual Activity   Alcohol use: Yes    Alcohol/week: 1.0 - 2.0  standard drink    Types: 1 - 2 Cans of beer per week    Comment: Occasionally   Drug use: No   Sexual activity: Not Currently    Birth control/protection: Abstinence, None  Other Topics Concern   Not on file  Social History Narrative   2 children, both nearby   Social Determinants of Health   Financial Resource Strain: Low Risk    Difficulty of Paying Living Expenses: Not hard at all  Food Insecurity: No Food Insecurity   Worried About Charity fundraiser in the Last Year: Never true   Arboriculturist in the Last Year: Never true  Transportation Needs: No Transportation Needs   Lack of Transportation (Medical): No   Lack of Transportation (Non-Medical): No  Physical Activity: Insufficiently Active   Days of Exercise per Week: 3 days   Minutes of Exercise per Session: 30 min  Stress: No Stress Concern Present   Feeling of Stress : Only  a little  Social Connections: Socially Isolated   Frequency of Communication with Friends and Family: More than three times a week   Frequency of Social Gatherings with Friends and Family: More than three times a week   Attends Religious Services: Never   Marine scientist or Organizations: No   Attends Music therapist: Never   Marital Status: Divorced  Human resources officer Violence: Not At Risk   Fear of Current or Ex-Partner: No   Emotionally Abused: No   Physically Abused: No   Sexually Abused: No    FAMILY HISTORY:  No family history on file.  CURRENT MEDICATIONS:  Current Outpatient Medications  Medication Sig Dispense Refill   albuterol (VENTOLIN HFA) 108 (90 Base) MCG/ACT inhaler Inhale 2 puffs into the lungs every 6 (six) hours as needed for wheezing or shortness of breath. 8 g 0   amLODipine (NORVASC) 5 MG tablet Take 5 mg by mouth daily.     aspirin EC 81 MG tablet Take 1 tablet (81 mg total) by mouth daily. 30 tablet 11   bortezomib IV (VELCADE) 3.5 MG injection Inject 1.5 mg/m2 into the vein once a week.      budesonide-formoterol (SYMBICORT) 80-4.5 MCG/ACT inhaler Inhale 2 puffs into the lungs 2 (two) times daily as needed.     calcitRIOL (ROCALTROL) 0.25 MCG capsule Take by mouth.     Calcium Carbonate-Vit D-Min (CALCIUM 1200 PO) Take 1-2 tablets by mouth daily.     cholecalciferol (VITAMIN D3) 25 MCG (1000 UNIT) tablet Take 1,000 Units by mouth daily.     CYCLOPHOSPHAMIDE IV Inject 300 mg/m2 into the vein once a week.     dexamethasone 40 mg in sodium chloride 0.9 % 50 mL Inject 40 mg into the vein once a week.     furosemide (LASIX) 20 MG tablet Take 20 mg by mouth 2 (two) times daily.     gabapentin (NEURONTIN) 300 MG capsule Take 1 capsule (300 mg total) by mouth 2 (two) times daily. 60 capsule 1   Multiple Vitamin (MULTIVITAMIN) tablet Take by mouth.     Omega-3 Fatty Acids (FISH OIL) 1000 MG CAPS Take 1,000 mg by mouth in the morning, at noon, and at bedtime.      oxyCODONE (OXY IR/ROXICODONE) 5 MG immediate release tablet Take 1 tablet (5 mg total) by mouth 2 (two) times daily as needed for severe pain. 30 tablet 0   pantoprazole (PROTONIX) 40 MG tablet Take 1 tablet (40 mg total) by mouth 2 (two) times daily. 180 tablet 3   polyethylene glycol (MIRALAX / GLYCOLAX) packet Take 17 g by mouth daily. Patient states that he takes as needed. 30 each 5   pravastatin (PRAVACHOL) 80 MG tablet Take 1 tablet (80 mg total) by mouth daily. 90 tablet 3   sodium bicarbonate 650 MG tablet Take 650 mg by mouth 3 (three) times daily.     tamsulosin (FLOMAX) 0.4 MG CAPS capsule Take 0.4 mg by mouth daily.      tiZANidine (ZANAFLEX) 2 MG tablet Take 1 tablet (2 mg total) by mouth every 6 (six) hours as needed for muscle spasms. 30 tablet 1   torsemide (DEMADEX) 100 MG tablet Take by mouth.     No current facility-administered medications for this visit.   Facility-Administered Medications Ordered in Other Visits  Medication Dose Route Frequency Provider Last Rate Last Admin   palonosetron (ALOXI) 0.25 MG/5ML  injection  ALLERGIES:  No Known Allergies  PHYSICAL EXAM:  Performance status (ECOG): 1 - Symptomatic but completely ambulatory  There were no vitals filed for this visit. Wt Readings from Last 3 Encounters:  08/17/21 258 lb (117 kg)  08/08/21 255 lb 9.6 oz (115.9 kg)  07/05/21 248 lb (112.5 kg)   Physical Exam Vitals reviewed.  Constitutional:      Appearance: Normal appearance.  Cardiovascular:     Rate and Rhythm: Normal rate and regular rhythm.     Pulses: Normal pulses.     Heart sounds: Normal heart sounds.  Pulmonary:     Effort: Pulmonary effort is normal.     Breath sounds: Normal breath sounds.  Musculoskeletal:     Right lower leg: Tenderness present. 3+ Edema present.     Left lower leg: Tenderness present. 3+ Edema present.  Neurological:     General: No focal deficit present.     Mental Status: He is alert and oriented to person, place, and time.  Psychiatric:        Mood and Affect: Mood normal.        Behavior: Behavior normal.     LABORATORY DATA:  I have reviewed the labs as listed.  CBC Latest Ref Rng & Units 08/08/2021 07/11/2021 07/04/2021  WBC 4.0 - 10.5 K/uL 8.0 6.9 6.5  Hemoglobin 13.0 - 17.0 g/dL 12.3(L) 10.3(L) 10.0(L)  Hematocrit 39.0 - 52.0 % 39.1 31.5(L) 29.7(L)  Platelets 150 - 400 K/uL 205 184 168   CMP Latest Ref Rng & Units 08/08/2021 07/11/2021 07/04/2021  Glucose 70 - 99 mg/dL 110(H) 105(H) 118(H)  BUN 8 - 23 mg/dL 56(H) 35(H) 42(H)  Creatinine 0.61 - 1.24 mg/dL 5.26(H) 3.57(H) 3.75(H)  Sodium 135 - 145 mmol/L 138 139 135  Potassium 3.5 - 5.1 mmol/L 3.5 4.4 4.4  Chloride 98 - 111 mmol/L 99 110 106  CO2 22 - 32 mmol/L 26 22 21(L)  Calcium 8.9 - 10.3 mg/dL 9.2 9.1 8.7(L)  Total Protein 6.5 - 8.1 g/dL 8.1 7.4 7.5  Total Bilirubin 0.3 - 1.2 mg/dL 0.7 0.7 0.5  Alkaline Phos 38 - 126 U/L 29(L) 31(L) 31(L)  AST 15 - 41 U/L _0 ALT 0 - 44 U/L _1 DIAGNOSTIC IMAGING:  I have independently reviewed the scans and  discussed with the patient. IR Fluoro Guide CV Line Left  Result Date: 08/17/2021 INDICATION: End-stage renal disease. In need of durable intravenous access for the initiation of hemodialysis. EXAM: TUNNELED CENTRAL VENOUS HEMODIALYSIS CATHETER PLACEMENT WITH ULTRASOUND AND FLUOROSCOPIC GUIDANCE MEDICATIONS: Ancef 2 gm IV . The antibiotic was given in an appropriate time interval prior to skin puncture. ANESTHESIA/SEDATION: Moderate (conscious) sedation was employed during this procedure as administered by the Interventional Radiology RN. A total of Versed 1.5 mg and Fentanyl 75 mcg was administered intravenously. Moderate Sedation Time: 29 minutes. The patient's level of consciousness and vital signs were monitored continuously by radiology nursing throughout the procedure under my direct supervision. FLUOROSCOPY TIME:  3 minutes, 42 seconds (52 mGy) COMPLICATIONS: None immediate. PROCEDURE: Informed written consent was obtained from the patient after a discussion of the risks, benefits, and alternatives to treatment. Questions regarding the procedure were encouraged and answered. Given the presence of the maturing right forearm AV fistula, the decision was made to proceed with left internal jugular approach dialysis catheter placement as to not impede central venous flow from the right upper extremity. As such, the left neck and chest were  prepped with chlorhexidine in a sterile fashion, and a sterile drape was applied covering the operative field. Maximum barrier sterile technique with sterile gowns and gloves were used for the procedure. A timeout was performed prior to the initiation of the procedure. After creating a small venotomy incision, a micropuncture kit was utilized to access the internal jugular vein. Real-time ultrasound guidance was utilized for vascular access including the acquisition of a permanent ultrasound image documenting patency of the accessed vessel. The microwire was utilized to  measure appropriate catheter length. A stiff Glidewire was advanced to the level of the IVC and the micropuncture sheath was exchanged for a peel-away sheath. Initially, a palindrome tunneled hemodialysis catheter measuring 23 cm from tip to cuff was tunneled in a retrograde fashion from the anterior chest wall to the venotomy incision. The catheter was then placed through the peel-away sheath with tips ultimately positioned within the mid SVC. As such, decision was made to exchange the catheter for a slightly longer hemodialysis catheter. As such, both lumens of the dialysis catheter was cannulated with stiff glide wires which were advanced to the level of the main pulmonary artery. Next, under intermittent fluoroscopic guidance, the existing dialysis catheter was exchanged for a new, slightly longer now 28 cm palindrome hemodialysis catheter with tip ultimately terminating within the superior aspect of the right atrium. Final catheter positioning was confirmed and documented with a spot radiographic image. The catheter aspirates and flushes normally. The catheter was flushed with appropriate volume heparin dwells. The catheter exit site was secured with a 0-Prolene retention suture. The venotomy incision was closed with Dermabond and Steri-strips. Dressings were applied. The patient tolerated the procedure well without immediate post procedural complication. IMPRESSION: Successful placement of 28 cm tip to cuff tunneled hemodialysis catheter via the left internal jugular vein with tips terminating within the superior aspect of the right atrium. The catheter is ready for immediate use. Note, a left internal jugular approach dialysis catheter was placed secondary to the presence of a maturing right forearm AV fistula. Electronically Signed   By: Sandi Mariscal M.D.   On: 08/17/2021 13:12   IR US Guide Vasc Access Left  Result Date: 08/17/2021 INDICATION: End-stage renal disease. In need of durable intravenous  access for the initiation of hemodialysis. EXAM: TUNNELED CENTRAL VENOUS HEMODIALYSIS CATHETER PLACEMENT WITH ULTRASOUND AND FLUOROSCOPIC GUIDANCE MEDICATIONS: Ancef 2 gm IV . The antibiotic was given in an appropriate time interval prior to skin puncture. ANESTHESIA/SEDATION: Moderate (conscious) sedation was employed during this procedure as administered by the Interventional Radiology RN. A total of Versed 1.5 mg and Fentanyl 75 mcg was administered intravenously. Moderate Sedation Time: 29 minutes. The patient's level of consciousness and vital signs were monitored continuously by radiology nursing throughout the procedure under my direct supervision. FLUOROSCOPY TIME:  3 minutes, 42 seconds (52 mGy) COMPLICATIONS: None immediate. PROCEDURE: Informed written consent was obtained from the patient after a discussion of the risks, benefits, and alternatives to treatment. Questions regarding the procedure were encouraged and answered. Given the presence of the maturing right forearm AV fistula, the decision was made to proceed with left internal jugular approach dialysis catheter placement as to not impede central venous flow from the right upper extremity. As such, the left neck and chest were prepped with chlorhexidine in a sterile fashion, and a sterile drape was applied covering the operative field. Maximum barrier sterile technique with sterile gowns and gloves were used for the procedure. A timeout was performed prior to the initiation  of the procedure. After creating a small venotomy incision, a micropuncture kit was utilized to access the internal jugular vein. Real-time ultrasound guidance was utilized for vascular access including the acquisition of a permanent ultrasound image documenting patency of the accessed vessel. The microwire was utilized to measure appropriate catheter length. A stiff Glidewire was advanced to the level of the IVC and the micropuncture sheath was exchanged for a peel-away sheath.  Initially, a palindrome tunneled hemodialysis catheter measuring 23 cm from tip to cuff was tunneled in a retrograde fashion from the anterior chest wall to the venotomy incision. The catheter was then placed through the peel-away sheath with tips ultimately positioned within the mid SVC. As such, decision was made to exchange the catheter for a slightly longer hemodialysis catheter. As such, both lumens of the dialysis catheter was cannulated with stiff glide wires which were advanced to the level of the main pulmonary artery. Next, under intermittent fluoroscopic guidance, the existing dialysis catheter was exchanged for a new, slightly longer now 28 cm palindrome hemodialysis catheter with tip ultimately terminating within the superior aspect of the right atrium. Final catheter positioning was confirmed and documented with a spot radiographic image. The catheter aspirates and flushes normally. The catheter was flushed with appropriate volume heparin dwells. The catheter exit site was secured with a 0-Prolene retention suture. The venotomy incision was closed with Dermabond and Steri-strips. Dressings were applied. The patient tolerated the procedure well without immediate post procedural complication. IMPRESSION: Successful placement of 28 cm tip to cuff tunneled hemodialysis catheter via the left internal jugular vein with tips terminating within the superior aspect of the right atrium. The catheter is ready for immediate use. Note, a left internal jugular approach dialysis catheter was placed secondary to the presence of a maturing right forearm AV fistula. Electronically Signed   By: Sandi Mariscal M.D.   On: 08/17/2021 13:12     ASSESSMENT:  1.  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  Basis for CKD is hypertension associated arteriosclerosis with  arterionephrosclerosis. - Bone marrow biopsy on 02/21/2021 with slightly hypercellular bone marrow with trilineage hematopoiesis.  Plasma cells increased in number representing 11% of all cells with kappa light chain restriction. - Myeloma FISH panel with no evidence of T p53.  Plasma cell enrichment yielded limited cellularity, therefore only tested for T p53 probes at. - Chromosome analysis 45, X,- Y (8)/46, XY (12) - Labs on 02/01/2021-M spike 2 g, kappa light chain 794, free light chain ratio 49.97.  Beta-2 microglobulin 5.5, albumin 3.5.  LDH normal. - PET scan on 03/08/2021 with no evidence of active myeloma.  No soft tissue plasmacytoma. - 24-hour urine total protein 2.2 g on 03/13/2021.  Immunofixation positive for IgG kappa. - CyBorD started on 03/14/2021.   2.  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  Smoked 2 packs/day for 32 years and quit smoking cigarettes and vaping now. -Mother had small cell lung cancer.   3.  CKD: -He has stage V CKD thought to be secondary to hypertension. -Renal ultrasound on 12/19/2020 was negative for obstructive uropathy and findings were consistent with chronic medical renal disease.  Bilateral kidney cysts were seen. -He has 2.7 g of proteinuria on 24-hour urine. -Kidney biopsy was done on 01/25/2021.   PLAN:  1.  Stage II, standard risk IgG kappa multiple myeloma: - He has  completed 4 cycles of CyBorD. - We held his treatment because of fluid retention. - We reviewed myeloma labs from 08/08/2021.  M spike has increased to 1.2 g.  Kappa free light chains increased to 375 and ratio increased to 28.4. - I have recommended restarting treatment for multiple myeloma. - His M spike went up slightly when he was on CyBorD regimen.  Hence I will discontinue Cytoxan and start daratumumab with Velcade.  He had problems with neuropathy.  We will give Velcade weekly dose rather than twice weekly dose.  Because of fluid retention, we  will decrease dexamethasone to 10 mg daily. - We discussed the regimen in detail.  We discussed the side effects in detail. - We will start his regimen the next day after dialysis. - We will see him back in 3 weeks for follow-up.   2.  Smoking history: - CT chest on 02/15/2021 was lung RADS 1.   3.  ID prophylaxis: - Continue aspirin for thromboprophylaxis.  Continue acyclovir twice daily.   4.  Myeloma bone disease: - Last denosumab on 06/27/2021. - We will restart denosumab in the next few weeks.  5.  Lower extremity swelling: - Continue torsemide 100 mg twice daily.  He still has 3+ edema.  6.  Peripheral neuropathy: - Continue gabapentin 300 mg twice daily which is helping.  7.  Low back pain: - Continue oxycodone 5 mg along with Tylenol as needed.  This is helping.  8.  ESRD on HD: - He was started on hemodialysis on 08/22/2021.  His dialysis days are Monday, Wednesday and Friday.   Orders placed this encounter:  No orders of the defined types were placed in this encounter.    Derek Jack, MD [place] [phone]   I, Thana Ates, am acting as a scribe for Dr. Derek Jack.  I, Derek Jack MD, have reviewed the above documentation for accuracy and completeness, and I agree with the above.

## 2021-08-28 ENCOUNTER — Inpatient Hospital Stay (HOSPITAL_BASED_OUTPATIENT_CLINIC_OR_DEPARTMENT_OTHER): Payer: Medicare HMO | Admitting: Hematology

## 2021-08-28 ENCOUNTER — Other Ambulatory Visit: Payer: Self-pay

## 2021-08-28 ENCOUNTER — Inpatient Hospital Stay (HOSPITAL_COMMUNITY): Payer: Medicare HMO

## 2021-08-28 DIAGNOSIS — C9000 Multiple myeloma not having achieved remission: Secondary | ICD-10-CM | POA: Diagnosis not present

## 2021-08-28 DIAGNOSIS — D472 Monoclonal gammopathy: Secondary | ICD-10-CM

## 2021-08-28 DIAGNOSIS — Z5112 Encounter for antineoplastic immunotherapy: Secondary | ICD-10-CM | POA: Diagnosis not present

## 2021-08-28 LAB — COMPREHENSIVE METABOLIC PANEL
ALT: 18 U/L (ref 0–44)
AST: 28 U/L (ref 15–41)
Albumin: 3.9 g/dL (ref 3.5–5.0)
Alkaline Phosphatase: 26 U/L — ABNORMAL LOW (ref 38–126)
Anion gap: 10 (ref 5–15)
BUN: 22 mg/dL (ref 8–23)
CO2: 27 mmol/L (ref 22–32)
Calcium: 8.2 mg/dL — ABNORMAL LOW (ref 8.9–10.3)
Chloride: 98 mmol/L (ref 98–111)
Creatinine, Ser: 3.47 mg/dL — ABNORMAL HIGH (ref 0.61–1.24)
GFR, Estimated: 19 mL/min — ABNORMAL LOW (ref >60)
Glucose, Bld: 99 mg/dL (ref 70–99)
Potassium: 3.7 mmol/L (ref 3.5–5.1)
Sodium: 135 mmol/L (ref 135–145)
Total Bilirubin: 0.8 mg/dL (ref 0.3–1.2)
Total Protein: 8.1 g/dL (ref 6.5–8.1)

## 2021-08-28 LAB — LACTATE DEHYDROGENASE: LDH: 259 U/L — ABNORMAL HIGH (ref 98–192)

## 2021-08-28 LAB — MAGNESIUM: Magnesium: 2.1 mg/dL (ref 1.7–2.4)

## 2021-08-28 NOTE — Progress Notes (Signed)
DISCONTINUE ON PATHWAY REGIMEN - Multiple Myeloma and Other Plasma Cell Dyscrasias     A cycle is every 28 days:     Dexamethasone      Bortezomib      Cyclophosphamide   **Always confirm dose/schedule in your pharmacy ordering system**  REASON: Toxicities / Adverse Event PRIOR TREATMENT: [id number]: CyBord (Cyclophosphamide 300 mg/m2 IV D1, 8, 15, 22 + Bortezomib 1.5 mg/m2 SUBQ D1, 8, 15, 22 + Dexamethasone 40 mg PO D1, 8, 15, 22) q28 Days x 4 Cycles TREATMENT RESPONSE: Partial Response (PR)  START ON PATHWAY REGIMEN - Multiple Myeloma and Other Plasma Cell Dyscrasias     Cycles 1 through 3: A cycle is every 21 days:     Dexamethasone      Bortezomib      Daratumumab and hyaluronidase-fihj    Cycles 4 through 8: A cycle is every 21 days:     Dexamethasone      Bortezomib      Daratumumab and hyaluronidase-fihj    Cycles 9 and beyond: A cycle is every 28 days:     Daratumumab and hyaluronidase-fihj   **Always confirm dose/schedule in your pharmacy ordering system**  Patient Characteristics: Multiple Myeloma, Relapsed / Refractory, Second through Fourth Lines of Therapy, Fit or Candidate for Triplet Therapy, Lenalidomide-Refractory or Lenalidomide-based Regimen Not Preferred, Candidate for Anti-CD38 Antibody Disease Classification: Multiple Myeloma R-ISS Staging: II Therapeutic Status: Relapsed Line of Therapy: Second Line Anti-CD38 Antibody Candidacy: Candidate for Anti-CD38 Antibody Lenalidomide-based Regimen Preference/Candidacy: Lenalidomide-based Regimen Not Preferred Intent of Therapy: Non-Curative / Palliative Intent, Discussed with Patient

## 2021-08-28 NOTE — Patient Instructions (Addendum)
[place] at [facility] Discharge Instructions   You were seen and examined today by Dr. Delton Coombes.   We are changing your treatment plan due to your myeloma numbers increasing.  You will be getting 2 injections to help treat this myeloma - Velcade (which you've already been getting) and Darzalex.   Thank you for choosing [place] at [facility] to provide your oncology and hematology care.  To afford each patient quality time with our provider, please arrive at least 15 minutes before your scheduled appointment time.   If you have a lab appointment with the [facility] please come in thru the Main Entrance and check in at the main information desk.  You need to re-schedule your appointment should you arrive 10 or more minutes late.  We strive to give you quality time with our providers, and arriving late affects you and other patients whose appointments are after yours.  Also, if you no show three or more times for appointments you may be dismissed from the clinic at the providers discretion.     Again, thank you for choosing [facility].  Our hope is that these requests will decrease the amount of time that you wait before being seen by our physicians.       _____________________________________________________________  Should you have questions after your visit to [facility], please contact our office at [phone] and follow the prompts.  Our office hours are 8:00 a.m. and 4:30 p.m. Monday - Friday.  Please note that voicemails left after 4:00 p.m. may not be returned until the following business day.  We are closed weekends and major holidays.  You do have access to a nurse 24-7, just call the main number to the clinic [phone] and do not press any options, hold on the line and a nurse will answer the phone.    For prescription refill requests, have your pharmacy contact our office and allow 72 hours.     Due to Covid, you will need to wear a mask upon entering the hospital. If you do not have a mask, a mask will be given to you at the Main Entrance upon arrival. For doctor visits, patients may have 1 support person age 26 or older with them. For treatment visits, patients can not have anyone with them due to social distancing guidelines and our immunocompromised population.

## 2021-08-28 NOTE — Progress Notes (Signed)
No treatment today, will start a new regimen on Thursday per MD.

## 2021-08-29 ENCOUNTER — Other Ambulatory Visit (HOSPITAL_COMMUNITY): Payer: Self-pay

## 2021-08-29 DIAGNOSIS — Z992 Dependence on renal dialysis: Secondary | ICD-10-CM | POA: Diagnosis not present

## 2021-08-29 DIAGNOSIS — D472 Monoclonal gammopathy: Secondary | ICD-10-CM

## 2021-08-29 DIAGNOSIS — N186 End stage renal disease: Secondary | ICD-10-CM | POA: Diagnosis not present

## 2021-08-30 ENCOUNTER — Inpatient Hospital Stay (HOSPITAL_COMMUNITY): Payer: Medicare HMO

## 2021-08-30 ENCOUNTER — Other Ambulatory Visit: Payer: Self-pay

## 2021-08-30 VITALS — BP 121/85 | HR 88 | Temp 97.7°F | Resp 20 | Wt 257.8 lb

## 2021-08-30 DIAGNOSIS — C9000 Multiple myeloma not having achieved remission: Secondary | ICD-10-CM | POA: Diagnosis not present

## 2021-08-30 DIAGNOSIS — D472 Monoclonal gammopathy: Secondary | ICD-10-CM

## 2021-08-30 DIAGNOSIS — Z5112 Encounter for antineoplastic immunotherapy: Secondary | ICD-10-CM | POA: Diagnosis not present

## 2021-08-30 LAB — CBC WITH DIFFERENTIAL/PLATELET
Abs Immature Granulocytes: 0.02 10*3/uL (ref 0.00–0.07)
Basophils Absolute: 0.1 10*3/uL (ref 0.0–0.1)
Basophils Relative: 1 %
Eosinophils Absolute: 0.2 10*3/uL (ref 0.0–0.5)
Eosinophils Relative: 3 %
HCT: 35.3 % — ABNORMAL LOW (ref 39.0–52.0)
Hemoglobin: 11.6 g/dL — ABNORMAL LOW (ref 13.0–17.0)
Immature Granulocytes: 0 %
Lymphocytes Relative: 36 %
Lymphs Abs: 2.5 10*3/uL (ref 0.7–4.0)
MCH: 33.4 pg (ref 26.0–34.0)
MCHC: 32.9 g/dL (ref 30.0–36.0)
MCV: 101.7 fL — ABNORMAL HIGH (ref 80.0–100.0)
Monocytes Absolute: 0.9 10*3/uL (ref 0.1–1.0)
Monocytes Relative: 13 %
Neutro Abs: 3.2 10*3/uL (ref 1.7–7.7)
Neutrophils Relative %: 47 %
Platelets: 187 10*3/uL (ref 150–400)
RBC: 3.47 MIL/uL — ABNORMAL LOW (ref 4.22–5.81)
RDW: 12.7 % (ref 11.5–15.5)
WBC: 6.9 10*3/uL (ref 4.0–10.5)
nRBC: 0 % (ref 0.0–0.2)

## 2021-08-30 LAB — TYPE AND SCREEN
ABO/RH(D): A POS
Antibody Screen: NEGATIVE

## 2021-08-30 MED ORDER — DARATUMUMAB-HYALURONIDASE-FIHJ 1800-30000 MG-UT/15ML ~~LOC~~ SOLN
1800.0000 mg | Freq: Once | SUBCUTANEOUS | Status: AC
  Administered 2021-08-30: 1800 mg via SUBCUTANEOUS
  Filled 2021-08-30: qty 15

## 2021-08-30 MED ORDER — MONTELUKAST SODIUM 10 MG PO TABS
10.0000 mg | ORAL_TABLET | Freq: Once | ORAL | Status: AC
  Administered 2021-08-30: 10 mg via ORAL
  Filled 2021-08-30: qty 1

## 2021-08-30 MED ORDER — ACETAMINOPHEN 325 MG PO TABS
650.0000 mg | ORAL_TABLET | Freq: Once | ORAL | Status: AC
  Administered 2021-08-30: 650 mg via ORAL
  Filled 2021-08-30: qty 2

## 2021-08-30 MED ORDER — BORTEZOMIB CHEMO SQ INJECTION 3.5 MG (2.5MG/ML)
1.0000 mg/m2 | Freq: Once | INTRAMUSCULAR | Status: AC
  Administered 2021-08-30: 2.25 mg via SUBCUTANEOUS
  Filled 2021-08-30: qty 0.9

## 2021-08-30 MED ORDER — DIPHENHYDRAMINE HCL 25 MG PO CAPS
50.0000 mg | ORAL_CAPSULE | Freq: Once | ORAL | Status: AC
  Administered 2021-08-30: 50 mg via ORAL
  Filled 2021-08-30: qty 2

## 2021-08-30 MED ORDER — DEXAMETHASONE 4 MG PO TABS
10.0000 mg | ORAL_TABLET | Freq: Once | ORAL | Status: AC
  Administered 2021-08-30: 10 mg via ORAL
  Filled 2021-08-30: qty 3

## 2021-08-30 NOTE — Progress Notes (Signed)
Patient presents today for D1,C1 Daratumumab and Velcade injections per providers order.  Vital signs and labs within parameters for treatment.  Creatinine is elevated and provider aware.  Stable during administration and 2 hour post injection wait time without incident; injection sites WNL; see MAR for injection details.  Patient tolerated procedure well and without incident.  No questions or complaints noted at this time.  Discharge from clinic ambulatory in stable condition.  Alert and oriented X 3.  Follow up with [facility] as scheduled.

## 2021-08-30 NOTE — Patient Instructions (Signed)
[place]  Discharge Instructions: Thank you for choosing [place] to provide your oncology and hematology care.  If you have a lab appointment with the [facility], please come in thru the Main Entrance and check in at the main information desk.  Wear comfortable clothing and clothing appropriate for easy access to any Portacath or PICC line.   We strive to give you quality time with your provider. You may need to reschedule your appointment if you arrive late (15 or more minutes).  Arriving late affects you and other patients whose appointments are after yours.  Also, if you miss three or more appointments without notifying the office, you may be dismissed from the clinic at the provider's discretion.      For prescription refill requests, have your pharmacy contact our office and allow 72 hours for refills to be completed.    Today you received the following chemotherapy and/or immunotherapy agents Daratumamab/Velcade      To help prevent nausea and vomiting after your treatment, we encourage you to take your nausea medication as directed.  BELOW ARE SYMPTOMS THAT SHOULD BE REPORTED IMMEDIATELY: *FEVER GREATER THAN 100.4 F (38 C) OR HIGHER *CHILLS OR SWEATING *NAUSEA AND VOMITING THAT IS NOT CONTROLLED WITH YOUR NAUSEA MEDICATION *UNUSUAL SHORTNESS OF BREATH *UNUSUAL BRUISING OR BLEEDING *URINARY PROBLEMS (pain or burning when urinating, or frequent urination) *BOWEL PROBLEMS (unusual diarrhea, constipation, pain near the anus) TENDERNESS IN MOUTH AND THROAT WITH OR WITHOUT PRESENCE OF ULCERS (sore throat, sores in mouth, or a toothache) UNUSUAL RASH, SWELLING OR PAIN  UNUSUAL VAGINAL DISCHARGE OR ITCHING   Items with * indicate a potential emergency and should be followed up as soon as possible or go to the Emergency Department if any problems should occur.  Please show the CHEMOTHERAPY ALERT CARD or IMMUNOTHERAPY ALERT CARD at check-in to the  Emergency Department and triage nurse.  Should you have questions after your visit or need to cancel or reschedule your appointment, please contact [facility] [phone]  and follow the prompts.  Office hours are 8:00 a.m. to 4:30 p.m. Monday - Friday. Please note that voicemails left after 4:00 p.m. may not be returned until the following business day.  We are closed weekends and major holidays. You have access to a nurse at all times for urgent questions. Please call the main number to the clinic [phone] and follow the prompts.  For any non-urgent questions, you may also contact your provider using MyChart. We now offer e-Visits for anyone 54 and older to request care online for non-urgent symptoms. For details visit [email or web address].   Also download the MyChart app! Go to the app store, search "MyChart", open the app, select [facility], and log in with your MyChart username and password.  Due to Covid, a mask is required upon entering the hospital/clinic. If you do not have a mask, one will be given to you upon arrival. For doctor visits, patients may have 1 support person aged 52 or older with them. For treatment visits, patients cannot have anyone with them due to current Covid guidelines and our immunocompromised population.

## 2021-08-31 DIAGNOSIS — Z992 Dependence on renal dialysis: Secondary | ICD-10-CM | POA: Diagnosis not present

## 2021-08-31 DIAGNOSIS — N186 End stage renal disease: Secondary | ICD-10-CM | POA: Diagnosis not present

## 2021-08-31 LAB — PRETREATMENT RBC PHENOTYPE

## 2021-08-31 NOTE — Progress Notes (Signed)
24 hour call back, called patient, no answer.  Mailbox was full and unable to leave a message.

## 2021-09-03 DIAGNOSIS — Z992 Dependence on renal dialysis: Secondary | ICD-10-CM | POA: Diagnosis not present

## 2021-09-03 DIAGNOSIS — N186 End stage renal disease: Secondary | ICD-10-CM | POA: Diagnosis not present

## 2021-09-05 ENCOUNTER — Ambulatory Visit (INDEPENDENT_AMBULATORY_CARE_PROVIDER_SITE_OTHER): Payer: Medicare HMO | Admitting: Vascular Surgery

## 2021-09-05 ENCOUNTER — Other Ambulatory Visit: Payer: Self-pay

## 2021-09-05 ENCOUNTER — Ambulatory Visit: Payer: Medicare HMO | Admitting: Vascular Surgery

## 2021-09-05 ENCOUNTER — Encounter: Payer: Self-pay | Admitting: Vascular Surgery

## 2021-09-05 VITALS — BP 107/72 | HR 103 | Temp 98.4°F | Resp 18 | Ht 67.0 in | Wt 258.0 lb

## 2021-09-05 DIAGNOSIS — Z992 Dependence on renal dialysis: Secondary | ICD-10-CM | POA: Diagnosis not present

## 2021-09-05 DIAGNOSIS — N186 End stage renal disease: Secondary | ICD-10-CM

## 2021-09-05 NOTE — H&P (View-Only) (Signed)
[facility] [place]  Patient name: Ernest Barnett MRN: [id number] DOB: 04-Aug-1956 Sex: male  REASON FOR VISIT: Follow-up right radiocephalic AV fistula  HPI: Ernest Barnett is a 64 y.o. male here today for follow-up.  He is here today with his significant other.  He is well-known to me from prior right radiocephalic AV fistula creation on 02/08/2021.  Follow-up office visit with me on 04/19/2021 revealed patency of the fistula with good Jaquez Farrington maturation.  I did note some competing branches at that time.  He has now progressed to renal failure and has a left IJ catheter placed by [facility] interventional radiology.  He had developed progressive lower extremity edema.  Past Medical History:  Diagnosis Date   Chronic kidney disease    COPD (chronic obstructive pulmonary disease) (HCC)    CVA (cerebral vascular accident) (HCC) 2013   Cystoid macular edema of left eye 03/07/2021   GERD (gastroesophageal reflux disease)    HOH (hard of hearing)    Hypercholesteremia    Hypertension    Left epiretinal membrane 03/07/2021   Pseudophakia 03/07/2021   Stroke (HCC) 03/24/2012   left sided weakness   Vitamin D deficiency     History reviewed. No pertinent family history.  SOCIAL HISTORY: Social History   Tobacco Use   Smoking status: Former    Packs/day: 3.00    Years: 30.00    Pack years: 90.00    Types: Cigarettes   Smokeless tobacco: Current   Tobacco comments:    Currently vape  Substance Use Topics   Alcohol use: Yes    Alcohol/week: 1.0 - 2.0 standard drink    Types: 1 - 2 Cans of beer per week    Comment: Occasionally    No Known Allergies  Current Outpatient Medications  Medication Sig Dispense Refill   albuterol (VENTOLIN HFA) 108 (90 Base) MCG/ACT inhaler Inhale 2 puffs into the lungs every 6 (six) hours as needed for wheezing or shortness of breath. 8 g 0   aspirin EC 81 MG tablet Take 1 tablet (81 mg total) by mouth  daily. 30 tablet 11   bortezomib IV (VELCADE) 3.5 MG injection Inject 1.5 mg/m2 into the vein once a week.     budesonide-formoterol (SYMBICORT) 80-4.5 MCG/ACT inhaler Inhale 2 puffs into the lungs 2 (two) times daily as needed.     calcitRIOL (ROCALTROL) 0.25 MCG capsule Take by mouth.     Calcium Carbonate-Vit D-Min (CALCIUM 1200 PO) Take 1-2 tablets by mouth daily.     cholecalciferol (VITAMIN D3) 25 MCG (1000 UNIT) tablet Take 1,000 Units by mouth daily.     CYCLOPHOSPHAMIDE IV Inject 300 mg/m2 into the vein once a week.     dexamethasone 40 mg in sodium chloride 0.9 % 50 mL Inject 40 mg into the vein once a week.     gabapentin (NEURONTIN) 300 MG capsule Take 1 capsule (300 mg total) by mouth 2 (two) times daily. 60 capsule 1   Multiple Vitamin (MULTIVITAMIN) tablet Take by mouth.     Omega-3 Fatty Acids (FISH OIL) 1000 MG CAPS Take 1,000 mg by mouth in the morning, at noon, and at bedtime.      oxyCODONE (OXY IR/ROXICODONE) 5 MG immediate release tablet Take 1 tablet (5 mg total) by mouth 2 (two) times daily as needed for severe pain. 30 tablet 0   pantoprazole (PROTONIX) 40 MG tablet Take 1 tablet (40 mg total) by mouth 2 (two) times daily. 180  tablet 3   polyethylene glycol (MIRALAX / GLYCOLAX) packet Take 17 g by mouth daily. Patient states that he takes as needed. 30 each 5   pravastatin (PRAVACHOL) 80 MG tablet Take 1 tablet (80 mg total) by mouth daily. 90 tablet 3   sodium bicarbonate 650 MG tablet Take 650 mg by mouth 3 (three) times daily.     tamsulosin (FLOMAX) 0.4 MG CAPS capsule Take 0.4 mg by mouth daily.      tiZANidine (ZANAFLEX) 2 MG tablet Take 1 tablet (2 mg total) by mouth every 6 (six) hours as needed for muscle spasms. 30 tablet 1   torsemide (DEMADEX) 100 MG tablet Take by mouth 2 (two) times daily.     No current facility-administered medications for this visit.   Facility-Administered Medications Ordered in Other Visits  Medication Dose Route Frequency Provider  Last Rate Last Admin   palonosetron (ALOXI) 0.25 MG/5ML injection             REVIEW OF SYSTEMS:  [X]  denotes positive finding, [ ]  denotes negative finding Cardiac  Comments:  Chest pain or chest pressure:    Shortness of breath upon exertion:    Short of breath when lying flat:    Irregular heart rhythm:        Vascular    Pain in calf, thigh, or hip brought on by ambulation:    Pain in feet at night that wakes you up from your sleep:     Blood clot in your veins:    Leg swelling:  x         PHYSICAL EXAM: Vitals:   09/05/21 0911  BP: 107/72  Pulse: (!) 103  Resp: 18  Temp: 98.4 F (36.9 C)  TempSrc: Temporal  SpO2: 95%  Weight: 258 lb (117 kg)  Height: 5\' 7"  (1.702 m)    GENERAL: The patient is a well-nourished male, in no acute distress. The vital signs are documented above. CARDIOVASCULAR: He does have a good thrill in his right forearm AV fistula.  There is a large competing branch arising above his right wrist.  He has cephalic vein is easily visible on the surface and is approximately 6 mm in diameter. PULMONARY: There is good air exchange  MUSCULOSKELETAL: There are no major deformities or cyanosis. NEUROLOGIC: No focal weakness or paresthesias are detected. SKIN: There are no ulcers or rashes noted. PSYCHIATRIC: The patient has a normal affect.  DATA:  I did image his fistula with SonoSite ultrasound.  He does have a large competing branch above the wrist and several smaller competing branches in the forearm.  There is good size.  His forearm cephalic vein drains both into the basilic and cephalic vein at the antecubital space and he does have a good caliber cephalic vein above the antecubital space  MEDICAL ISSUES: Had long discussion with the patient and his significant other.  I discussed attempts at improving his right radiocephalic fistula versus abandonment of this and placing a new access.  His vein caliber is nearly adequate for access.  I have reported  attempts at salvage of this.  I do not see any evidence that there is difficulty with inflow by physical exam or ultrasound.  We will assess this at the time of revision as well.  I have recommended ligation of competing branches at [facility].  He should be able to use his fistula 1 month following this assuming adequate maturation.  We have scheduled his surgery for 09/11/2021  Larina Earthly, MD FACS [facility] [phone]  Note: Portions of this report may have been transcribed using voice recognition software.  Every effort has been made to ensure accuracy; however, inadvertent computerized transcription errors may still be present.

## 2021-09-05 NOTE — Progress Notes (Signed)
[facility] [place]  Patient name: Ernest Barnett MRN: [id number] DOB: 04-Aug-1956 Sex: male  REASON FOR VISIT: Follow-up right radiocephalic AV fistula  HPI: Ernest Barnett is a 65 y.o. male here today for follow-up.  He is here today with his significant other.  He is well-known to me from prior right radiocephalic AV fistula creation on 02/08/2021.  Follow-up office visit with me on 04/19/2021 revealed patency of the fistula with good Jaquez Farrington maturation.  I did note some competing branches at that time.  He has now progressed to renal failure and has a left IJ catheter placed by [facility] interventional radiology.  He had developed progressive lower extremity edema.  Past Medical History:  Diagnosis Date   Chronic kidney disease    COPD (chronic obstructive pulmonary disease) (HCC)    CVA (cerebral vascular accident) (HCC) 2013   Cystoid macular edema of left eye 03/07/2021   GERD (gastroesophageal reflux disease)    HOH (hard of hearing)    Hypercholesteremia    Hypertension    Left epiretinal membrane 03/07/2021   Pseudophakia 03/07/2021   Stroke (HCC) 03/24/2012   left sided weakness   Vitamin D deficiency     History reviewed. No pertinent family history.  SOCIAL HISTORY: Social History   Tobacco Use   Smoking status: Former    Packs/day: 3.00    Years: 30.00    Pack years: 90.00    Types: Cigarettes   Smokeless tobacco: Current   Tobacco comments:    Currently vape  Substance Use Topics   Alcohol use: Yes    Alcohol/week: 1.0 - 2.0 standard drink    Types: 1 - 2 Cans of beer per week    Comment: Occasionally    No Known Allergies  Current Outpatient Medications  Medication Sig Dispense Refill   albuterol (VENTOLIN HFA) 108 (90 Base) MCG/ACT inhaler Inhale 2 puffs into the lungs every 6 (six) hours as needed for wheezing or shortness of breath. 8 g 0   aspirin EC 81 MG tablet Take 1 tablet (81 mg total) by mouth  daily. 30 tablet 11   bortezomib IV (VELCADE) 3.5 MG injection Inject 1.5 mg/m2 into the vein once a week.     budesonide-formoterol (SYMBICORT) 80-4.5 MCG/ACT inhaler Inhale 2 puffs into the lungs 2 (two) times daily as needed.     calcitRIOL (ROCALTROL) 0.25 MCG capsule Take by mouth.     Calcium Carbonate-Vit D-Min (CALCIUM 1200 PO) Take 1-2 tablets by mouth daily.     cholecalciferol (VITAMIN D3) 25 MCG (1000 UNIT) tablet Take 1,000 Units by mouth daily.     CYCLOPHOSPHAMIDE IV Inject 300 mg/m2 into the vein once a week.     dexamethasone 40 mg in sodium chloride 0.9 % 50 mL Inject 40 mg into the vein once a week.     gabapentin (NEURONTIN) 300 MG capsule Take 1 capsule (300 mg total) by mouth 2 (two) times daily. 60 capsule 1   Multiple Vitamin (MULTIVITAMIN) tablet Take by mouth.     Omega-3 Fatty Acids (FISH OIL) 1000 MG CAPS Take 1,000 mg by mouth in the morning, at noon, and at bedtime.      oxyCODONE (OXY IR/ROXICODONE) 5 MG immediate release tablet Take 1 tablet (5 mg total) by mouth 2 (two) times daily as needed for severe pain. 30 tablet 0   pantoprazole (PROTONIX) 40 MG tablet Take 1 tablet (40 mg total) by mouth 2 (two) times daily. 180  tablet 3   polyethylene glycol (MIRALAX / GLYCOLAX) packet Take 17 g by mouth daily. Patient states that he takes as needed. 30 each 5   pravastatin (PRAVACHOL) 80 MG tablet Take 1 tablet (80 mg total) by mouth daily. 90 tablet 3   sodium bicarbonate 650 MG tablet Take 650 mg by mouth 3 (three) times daily.     tamsulosin (FLOMAX) 0.4 MG CAPS capsule Take 0.4 mg by mouth daily.      tiZANidine (ZANAFLEX) 2 MG tablet Take 1 tablet (2 mg total) by mouth every 6 (six) hours as needed for muscle spasms. 30 tablet 1   torsemide (DEMADEX) 100 MG tablet Take by mouth 2 (two) times daily.     No current facility-administered medications for this visit.   Facility-Administered Medications Ordered in Other Visits  Medication Dose Route Frequency Provider  Last Rate Last Admin   palonosetron (ALOXI) 0.25 MG/5ML injection             REVIEW OF SYSTEMS:  [X]  denotes positive finding, [ ]  denotes negative finding Cardiac  Comments:  Chest pain or chest pressure:    Shortness of breath upon exertion:    Short of breath when lying flat:    Irregular heart rhythm:        Vascular    Pain in calf, thigh, or hip brought on by ambulation:    Pain in feet at night that wakes you up from your sleep:     Blood clot in your veins:    Leg swelling:  x         PHYSICAL EXAM: Vitals:   09/05/21 0911  BP: 107/72  Pulse: (!) 103  Resp: 18  Temp: 98.4 F (36.9 C)  TempSrc: Temporal  SpO2: 95%  Weight: 258 lb (117 kg)  Height: 5\' 7"  (1.702 m)    GENERAL: The patient is a well-nourished male, in no acute distress. The vital signs are documented above. CARDIOVASCULAR: He does have a good thrill in his right forearm AV fistula.  There is a large competing branch arising above his right wrist.  He has cephalic vein is easily visible on the surface and is approximately 6 mm in diameter. PULMONARY: There is good air exchange  MUSCULOSKELETAL: There are no major deformities or cyanosis. NEUROLOGIC: No focal weakness or paresthesias are detected. SKIN: There are no ulcers or rashes noted. PSYCHIATRIC: The patient has a normal affect.  DATA:  I did image his fistula with SonoSite ultrasound.  He does have a large competing branch above the wrist and several smaller competing branches in the forearm.  There is good size.  His forearm cephalic vein drains both into the basilic and cephalic vein at the antecubital space and he does have a good caliber cephalic vein above the antecubital space  MEDICAL ISSUES: Had long discussion with the patient and his significant other.  I discussed attempts at improving his right radiocephalic fistula versus abandonment of this and placing a new access.  His vein caliber is nearly adequate for access.  I have reported  attempts at salvage of this.  I do not see any evidence that there is difficulty with inflow by physical exam or ultrasound.  We will assess this at the time of revision as well.  I have recommended ligation of competing branches at [facility].  He should be able to use his fistula 1 month following this assuming adequate maturation.  We have scheduled his surgery for 09/11/2021  Larina Earthly, MD FACS [facility] [phone]  Note: Portions of this report may have been transcribed using voice recognition software.  Every effort has been made to ensure accuracy; however, inadvertent computerized transcription errors may still be present.

## 2021-09-06 ENCOUNTER — Inpatient Hospital Stay (HOSPITAL_COMMUNITY): Payer: Medicare HMO

## 2021-09-06 ENCOUNTER — Encounter (HOSPITAL_COMMUNITY): Payer: Self-pay

## 2021-09-06 ENCOUNTER — Inpatient Hospital Stay (HOSPITAL_COMMUNITY): Payer: Medicare HMO | Attending: Hematology

## 2021-09-06 VITALS — BP 107/71 | HR 99 | Temp 97.4°F | Resp 20 | Wt 255.6 lb

## 2021-09-06 DIAGNOSIS — C9000 Multiple myeloma not having achieved remission: Secondary | ICD-10-CM | POA: Diagnosis present

## 2021-09-06 DIAGNOSIS — Z5112 Encounter for antineoplastic immunotherapy: Secondary | ICD-10-CM | POA: Diagnosis not present

## 2021-09-06 DIAGNOSIS — Z96642 Presence of left artificial hip joint: Secondary | ICD-10-CM

## 2021-09-06 LAB — CBC WITH DIFFERENTIAL/PLATELET
Abs Immature Granulocytes: 0.01 10*3/uL (ref 0.00–0.07)
Basophils Absolute: 0.1 10*3/uL (ref 0.0–0.1)
Basophils Relative: 1 %
Eosinophils Absolute: 0.2 10*3/uL (ref 0.0–0.5)
Eosinophils Relative: 3 %
HCT: 35.7 % — ABNORMAL LOW (ref 39.0–52.0)
Hemoglobin: 11.9 g/dL — ABNORMAL LOW (ref 13.0–17.0)
Immature Granulocytes: 0 %
Lymphocytes Relative: 30 %
Lymphs Abs: 2 10*3/uL (ref 0.7–4.0)
MCH: 33.6 pg (ref 26.0–34.0)
MCHC: 33.3 g/dL (ref 30.0–36.0)
MCV: 100.8 fL — ABNORMAL HIGH (ref 80.0–100.0)
Monocytes Absolute: 1.6 10*3/uL — ABNORMAL HIGH (ref 0.1–1.0)
Monocytes Relative: 23 %
Neutro Abs: 3 10*3/uL (ref 1.7–7.7)
Neutrophils Relative %: 43 %
Platelets: 208 10*3/uL (ref 150–400)
RBC: 3.54 MIL/uL — ABNORMAL LOW (ref 4.22–5.81)
RDW: 12.4 % (ref 11.5–15.5)
WBC: 6.9 10*3/uL (ref 4.0–10.5)
nRBC: 0 % (ref 0.0–0.2)

## 2021-09-06 LAB — COMPREHENSIVE METABOLIC PANEL
ALT: 22 U/L (ref 0–44)
AST: 18 U/L (ref 15–41)
Albumin: 3.7 g/dL (ref 3.5–5.0)
Alkaline Phosphatase: 26 U/L — ABNORMAL LOW (ref 38–126)
Anion gap: 10 (ref 5–15)
BUN: 24 mg/dL — ABNORMAL HIGH (ref 8–23)
CO2: 27 mmol/L (ref 22–32)
Calcium: 8.4 mg/dL — ABNORMAL LOW (ref 8.9–10.3)
Chloride: 99 mmol/L (ref 98–111)
Creatinine, Ser: 3.4 mg/dL — ABNORMAL HIGH (ref 0.61–1.24)
GFR, Estimated: 19 mL/min — ABNORMAL LOW (ref >60)
Glucose, Bld: 118 mg/dL — ABNORMAL HIGH (ref 70–99)
Potassium: 3.4 mmol/L — ABNORMAL LOW (ref 3.5–5.1)
Sodium: 136 mmol/L (ref 135–145)
Total Bilirubin: 0.6 mg/dL (ref 0.3–1.2)
Total Protein: 7.6 g/dL (ref 6.5–8.1)

## 2021-09-06 LAB — LACTATE DEHYDROGENASE: LDH: 205 U/L — ABNORMAL HIGH (ref 98–192)

## 2021-09-06 LAB — MAGNESIUM: Magnesium: 2.1 mg/dL (ref 1.7–2.4)

## 2021-09-06 MED ORDER — ACETAMINOPHEN 325 MG PO TABS
650.0000 mg | ORAL_TABLET | Freq: Once | ORAL | Status: AC
  Administered 2021-09-06: 650 mg via ORAL
  Filled 2021-09-06: qty 2

## 2021-09-06 MED ORDER — DARATUMUMAB-HYALURONIDASE-FIHJ 1800-30000 MG-UT/15ML ~~LOC~~ SOLN
1800.0000 mg | Freq: Once | SUBCUTANEOUS | Status: AC
  Administered 2021-09-06: 1800 mg via SUBCUTANEOUS
  Filled 2021-09-06: qty 15

## 2021-09-06 MED ORDER — DEXAMETHASONE 4 MG PO TABS
10.0000 mg | ORAL_TABLET | Freq: Once | ORAL | Status: AC
  Administered 2021-09-06: 10 mg via ORAL
  Filled 2021-09-06: qty 3

## 2021-09-06 MED ORDER — DIPHENHYDRAMINE HCL 25 MG PO CAPS
50.0000 mg | ORAL_CAPSULE | Freq: Once | ORAL | Status: AC
  Administered 2021-09-06: 50 mg via ORAL
  Filled 2021-09-06: qty 2

## 2021-09-06 MED ORDER — MONTELUKAST SODIUM 10 MG PO TABS
10.0000 mg | ORAL_TABLET | Freq: Once | ORAL | Status: AC
  Administered 2021-09-06: 10 mg via ORAL
  Filled 2021-09-06: qty 1

## 2021-09-06 MED ORDER — BORTEZOMIB CHEMO SQ INJECTION 3.5 MG (2.5MG/ML)
1.0000 mg/m2 | Freq: Once | INTRAMUSCULAR | Status: AC
  Administered 2021-09-06: 2.25 mg via SUBCUTANEOUS
  Filled 2021-09-06: qty 0.9

## 2021-09-06 NOTE — Patient Instructions (Signed)
[facility]  Discharge Instructions: Thank you for choosing [facility] to provide your oncology and hematology care.  If you have a lab appointment with the Cancer Center, please come in thru the Main Entrance and check in at the main information desk.  Wear comfortable clothing and clothing appropriate for easy access to any Portacath or PICC line.   We strive to give you quality time with your provider. You may need to reschedule your appointment if you arrive late (15 or more minutes).  Arriving late affects you and other patients whose appointments are after yours.  Also, if you miss three or more appointments without notifying the office, you may be dismissed from the clinic at the provider's discretion.      For prescription refill requests, have your pharmacy contact our office and allow 72 hours for refills to be completed.        To help prevent nausea and vomiting after your treatment, we encourage you to take your nausea medication as directed.  BELOW ARE SYMPTOMS THAT SHOULD BE REPORTED IMMEDIATELY: *FEVER GREATER THAN 100.4 F (38 C) OR HIGHER *CHILLS OR SWEATING *NAUSEA AND VOMITING THAT IS NOT CONTROLLED WITH YOUR NAUSEA MEDICATION *UNUSUAL SHORTNESS OF BREATH *UNUSUAL BRUISING OR BLEEDING *URINARY PROBLEMS (pain or burning when urinating, or frequent urination) *BOWEL PROBLEMS (unusual diarrhea, constipation, pain near the anus) TENDERNESS IN MOUTH AND THROAT WITH OR WITHOUT PRESENCE OF ULCERS (sore throat, sores in mouth, or a toothache) UNUSUAL RASH, SWELLING OR PAIN  UNUSUAL VAGINAL DISCHARGE OR ITCHING   Items with * indicate a potential emergency and should be followed up as soon as possible or go to the Emergency Department if any problems should occur.  Please show the CHEMOTHERAPY ALERT CARD or IMMUNOTHERAPY ALERT CARD at check-in to the Emergency Department and triage nurse.  Should you have questions after your visit or need to cancel  or reschedule your appointment, please contact  [facility] [phone]  and follow the prompts.  Office hours are 8:00 a.m. to 4:30 p.m. Monday - Friday. Please note that voicemails left after 4:00 p.m. may not be returned until the following business day.  We are closed weekends and major holidays. You have access to a nurse at all times for urgent questions. Please call the main number to the clinic [phone] and follow the prompts.  For any non-urgent questions, you may also contact your provider using MyChart. We now offer e-Visits for anyone 18 and older to request care online for non-urgent symptoms. For details visit [email or web address].   Also download the MyChart app! Go to the app store, search "MyChart", open the app, select [facility], and log in with your MyChart username and password.  Due to Covid, a mask is required upon entering the hospital/clinic. If you do not have a mask, one will be given to you upon arrival. For doctor visits, patients may have 1 support person aged 18 or older with them. For treatment visits, patients cannot have anyone with them due to current Covid guidelines and our immunocompromised population.  

## 2021-09-06 NOTE — Progress Notes (Signed)
Labs meet parameters for treatment. Creatinine is 3.40 which is slightly lower than last time. Ok to treat per MD.  Treatment given per orders. Patient tolerated it well without problems. Vitals stable and discharged home from clinic ambulatory. Follow up as scheduled.

## 2021-09-07 ENCOUNTER — Encounter (HOSPITAL_COMMUNITY)
Admission: RE | Admit: 2021-09-07 | Discharge: 2021-09-07 | Disposition: A | Discharge status: Home / Self Care | Payer: Medicare HMO | Source: Ambulatory Visit | Attending: Vascular Surgery | Admitting: Vascular Surgery

## 2021-09-07 ENCOUNTER — Other Ambulatory Visit: Payer: Self-pay

## 2021-09-07 ENCOUNTER — Encounter (HOSPITAL_COMMUNITY): Payer: Self-pay

## 2021-09-07 DIAGNOSIS — N186 End stage renal disease: Secondary | ICD-10-CM | POA: Diagnosis not present

## 2021-09-07 DIAGNOSIS — Z992 Dependence on renal dialysis: Secondary | ICD-10-CM | POA: Diagnosis not present

## 2021-09-10 ENCOUNTER — Other Ambulatory Visit (HOSPITAL_COMMUNITY): Payer: Self-pay

## 2021-09-10 DIAGNOSIS — Z992 Dependence on renal dialysis: Secondary | ICD-10-CM | POA: Diagnosis not present

## 2021-09-10 DIAGNOSIS — N186 End stage renal disease: Secondary | ICD-10-CM | POA: Diagnosis not present

## 2021-09-11 ENCOUNTER — Encounter (HOSPITAL_COMMUNITY): Payer: Self-pay | Admitting: Hematology

## 2021-09-11 ENCOUNTER — Ambulatory Visit (HOSPITAL_COMMUNITY): Payer: Medicare HMO | Admitting: Anesthesiology

## 2021-09-11 ENCOUNTER — Ambulatory Visit (HOSPITAL_COMMUNITY)
Admission: RE | Admit: 2021-09-11 | Discharge: 2021-09-11 | Disposition: A | Discharge status: Home / Self Care | Payer: Medicare HMO | Attending: Vascular Surgery | Admitting: Vascular Surgery

## 2021-09-11 ENCOUNTER — Encounter (HOSPITAL_COMMUNITY)
Admission: RE | Disposition: A | Discharge status: Home / Self Care | Payer: Self-pay | Source: Home / Self Care | Attending: Vascular Surgery

## 2021-09-11 ENCOUNTER — Encounter (HOSPITAL_COMMUNITY): Payer: Self-pay | Admitting: Vascular Surgery

## 2021-09-11 ENCOUNTER — Encounter (HOSPITAL_COMMUNITY): Payer: Self-pay | Admitting: *Deleted

## 2021-09-11 DIAGNOSIS — T82898A Other specified complication of vascular prosthetic devices, implants and grafts, initial encounter: Secondary | ICD-10-CM | POA: Diagnosis not present

## 2021-09-11 DIAGNOSIS — I120 Hypertensive chronic kidney disease with stage 5 chronic kidney disease or end stage renal disease: Secondary | ICD-10-CM | POA: Diagnosis not present

## 2021-09-11 DIAGNOSIS — Z992 Dependence on renal dialysis: Secondary | ICD-10-CM | POA: Insufficient documentation

## 2021-09-11 DIAGNOSIS — N186 End stage renal disease: Secondary | ICD-10-CM | POA: Insufficient documentation

## 2021-09-11 HISTORY — PX: LIGATION OF COMPETING BRANCHES OF ARTERIOVENOUS FISTULA: SHX5949

## 2021-09-11 LAB — POCT I-STAT, CHEM 8
BUN: 43 mg/dL — ABNORMAL HIGH (ref 8–23)
Calcium, Ion: 1.05 mmol/L — ABNORMAL LOW (ref 1.15–1.40)
Chloride: 103 mmol/L (ref 98–111)
Creatinine, Ser: 4.2 mg/dL — ABNORMAL HIGH (ref 0.61–1.24)
Glucose, Bld: 111 mg/dL — ABNORMAL HIGH (ref 70–99)
HCT: 35 % — ABNORMAL LOW (ref 39.0–52.0)
Hemoglobin: 11.9 g/dL — ABNORMAL LOW (ref 13.0–17.0)
Potassium: 4 mmol/L (ref 3.5–5.1)
Sodium: 139 mmol/L (ref 135–145)
TCO2: 28 mmol/L (ref 22–32)

## 2021-09-11 SURGERY — LIGATION OF COMPETING BRANCHES OF ARTERIOVENOUS FISTULA
Anesthesia: General | Site: Arm Lower | Laterality: Right

## 2021-09-11 MED ORDER — LIDOCAINE HCL (PF) 2 % IJ SOLN
INTRAMUSCULAR | Status: AC
  Filled 2021-09-11: qty 5

## 2021-09-11 MED ORDER — 0.9 % SODIUM CHLORIDE (POUR BTL) OPTIME
TOPICAL | Status: DC | PRN
  Administered 2021-09-11: 1000 mL

## 2021-09-11 MED ORDER — FENTANYL CITRATE PF 50 MCG/ML IJ SOSY: 25.0000 ug | PREFILLED_SYRINGE | INTRAMUSCULAR | Status: DC | PRN

## 2021-09-11 MED ORDER — ONDANSETRON HCL 4 MG/2ML IJ SOLN: 4.0000 mg | Freq: Once | INTRAMUSCULAR | Status: DC | PRN

## 2021-09-11 MED ORDER — CHLORHEXIDINE GLUCONATE 0.12 % MT SOLN
15.0000 mL | Freq: Once | OROMUCOSAL | Status: AC
  Administered 2021-09-11: 15 mL via OROMUCOSAL
  Filled 2021-09-11: qty 15

## 2021-09-11 MED ORDER — MIDAZOLAM HCL 2 MG/2ML IJ SOLN
INTRAMUSCULAR | Status: AC
  Filled 2021-09-11: qty 2

## 2021-09-11 MED ORDER — OXYCODONE HCL 5 MG PO TABS: 5.0000 mg | ORAL_TABLET | Freq: Two times a day (BID) | ORAL | 0 refills | Status: DC | PRN

## 2021-09-11 MED ORDER — HEPARIN SODIUM (PORCINE) 1000 UNIT/ML IJ SOLN
INTRAMUSCULAR | Status: AC
  Filled 2021-09-11: qty 6

## 2021-09-11 MED ORDER — ORAL CARE MOUTH RINSE: 15.0000 mL | Freq: Once | OROMUCOSAL | Status: AC

## 2021-09-11 MED ORDER — PROPOFOL 10 MG/ML IV BOLUS
INTRAVENOUS | Status: AC
  Filled 2021-09-11: qty 40

## 2021-09-11 MED ORDER — SODIUM CHLORIDE 0.9 % IV SOLN: INTRAVENOUS | Status: DC

## 2021-09-11 MED ORDER — PROPOFOL 10 MG/ML IV BOLUS
INTRAVENOUS | Status: DC | PRN
  Administered 2021-09-11: 30 mg via INTRAVENOUS
  Administered 2021-09-11 (×7): 20 mg via INTRAVENOUS
  Administered 2021-09-11: 30 mg via INTRAVENOUS
  Administered 2021-09-11: 20 mg via INTRAVENOUS

## 2021-09-11 MED ORDER — LIDOCAINE HCL (CARDIAC) PF 100 MG/5ML IV SOSY
PREFILLED_SYRINGE | INTRAVENOUS | Status: DC | PRN
  Administered 2021-09-11: 40 mg via INTRATRACHEAL

## 2021-09-11 MED ORDER — PHENYLEPHRINE HCL (PRESSORS) 10 MG/ML IV SOLN
INTRAVENOUS | Status: DC | PRN
  Administered 2021-09-11 (×3): 80 ug via INTRAVENOUS

## 2021-09-11 MED ORDER — LIDOCAINE-EPINEPHRINE 0.5 %-1:200000 IJ SOLN
INTRAMUSCULAR | Status: AC
  Filled 2021-09-11: qty 1

## 2021-09-11 MED ORDER — CEFAZOLIN SODIUM-DEXTROSE 2-4 GM/100ML-% IV SOLN
2.0000 g | INTRAVENOUS | Status: AC
  Administered 2021-09-11: 2 g via INTRAVENOUS
  Filled 2021-09-11: qty 100

## 2021-09-11 MED ORDER — FENTANYL CITRATE (PF) 100 MCG/2ML IJ SOLN
INTRAMUSCULAR | Status: DC | PRN
  Administered 2021-09-11 (×4): 25 ug via INTRAVENOUS

## 2021-09-11 MED ORDER — FENTANYL CITRATE (PF) 100 MCG/2ML IJ SOLN
INTRAMUSCULAR | Status: AC
  Filled 2021-09-11: qty 2

## 2021-09-11 MED ORDER — CHLORHEXIDINE GLUCONATE 4 % EX LIQD: 60.0000 mL | Freq: Once | CUTANEOUS | Status: DC

## 2021-09-11 MED ORDER — LIDOCAINE-EPINEPHRINE 0.5 %-1:200000 IJ SOLN
INTRAMUSCULAR | Status: DC | PRN
  Administered 2021-09-11: 5 mL

## 2021-09-11 MED ORDER — LACTATED RINGERS IV SOLN
INTRAVENOUS | Status: DC | PRN
Start: 2021-09-11 — End: 2021-09-11

## 2021-09-11 SURGICAL SUPPLY — 33 items
ADH SKN CLS APL DERMABOND .7 (GAUZE/BANDAGES/DRESSINGS) ×1
ARMBAND PINK RESTRICT EXTREMIT (MISCELLANEOUS) ×3 IMPLANT
CLIP LIGATING EXTRA MED SLVR (CLIP) ×3 IMPLANT
CLIP LIGATING EXTRA SM BLUE (MISCELLANEOUS) ×3 IMPLANT
COVER LIGHT HANDLE STERIS (MISCELLANEOUS) ×6 IMPLANT
COVER MAYO STAND XLG (MISCELLANEOUS) ×3 IMPLANT
DERMABOND ADVANCED (GAUZE/BANDAGES/DRESSINGS) ×2
DERMABOND ADVANCED .7 DNX12 (GAUZE/BANDAGES/DRESSINGS) ×1 IMPLANT
ELECT REM PT RETURN 9FT ADLT (ELECTROSURGICAL) ×3
ELECTRODE REM PT RTRN 9FT ADLT (ELECTROSURGICAL) ×1 IMPLANT
GAUZE SPONGE 4X4 12PLY STRL (GAUZE/BANDAGES/DRESSINGS) ×3 IMPLANT
GLOVE SURG MICRO LTX SZ7.5 (GLOVE) ×3 IMPLANT
GLOVE SURG UNDER POLY LF SZ7 (GLOVE) ×9 IMPLANT
GOWN STRL REUS W/TWL LRG LVL3 (GOWN DISPOSABLE) ×6 IMPLANT
IV NS 500ML (IV SOLUTION)
IV NS 500ML BAXH (IV SOLUTION) IMPLANT
KIT BLADEGUARD II DBL (SET/KITS/TRAYS/PACK) ×3 IMPLANT
KIT TURNOVER KIT A (KITS) ×3 IMPLANT
MANIFOLD NEPTUNE II (INSTRUMENTS) ×3 IMPLANT
MARKER SKIN DUAL TIP RULER LAB (MISCELLANEOUS) ×6 IMPLANT
NS IRRIG 1000ML POUR BTL (IV SOLUTION) ×3 IMPLANT
PACK CV ACCESS (CUSTOM PROCEDURE TRAY) ×3 IMPLANT
PAD ARMBOARD 7.5X6 YLW CONV (MISCELLANEOUS) ×6 IMPLANT
SET BASIN LINEN APH (SET/KITS/TRAYS/PACK) ×3 IMPLANT
SOL PREP POV-IOD 4OZ 10% (MISCELLANEOUS) ×3 IMPLANT
SOL PREP PROV IODINE SCRUB 4OZ (MISCELLANEOUS) ×3 IMPLANT
SUT ETHILON 3 0 PS 1 (SUTURE) IMPLANT
SUT PROLENE 6 0 CC (SUTURE) IMPLANT
SUT SILK 0 TIES 10X30 (SUTURE) IMPLANT
SUT SILK 1 TIES 10/18 (SUTURE) IMPLANT
SUT VIC AB 3-0 SH 27 (SUTURE) ×3
SUT VIC AB 3-0 SH 27X BRD (SUTURE) ×1 IMPLANT
UNDERPAD 30X36 HEAVY ABSORB (UNDERPADS AND DIAPERS) ×3 IMPLANT

## 2021-09-11 NOTE — Interval H&P Note (Signed)
History and Physical Interval Note:  09/11/2021 7:22 AM  Ernest Barnett  has presented today for surgery, with the diagnosis of ESRD.  The various methods of treatment have been discussed with the patient and family. After consideration of risks, benefits and other options for treatment, the patient has consented to  Procedure(s): LIGATION OF COMPETING BRANCHES OF RIGHT ARM ARTERIOVENOUS FISTULA (Right) as a surgical intervention.  The patient's history has been reviewed, patient examined, no change in status, stable for surgery.  I have reviewed the patient's chart and labs.  Questions were answered to the patient's satisfaction.     Curt Jews

## 2021-09-11 NOTE — Anesthesia Procedure Notes (Signed)
Date/Time: 09/11/2021 7:35 AM Performed by: Karna Dupes, CRNA Pre-anesthesia Checklist: Patient identified, Emergency Drugs available, Suction available and Patient being monitored Patient Re-evaluated:Patient Re-evaluated prior to induction Oxygen Delivery Method: Non-rebreather mask

## 2021-09-11 NOTE — Anesthesia Preprocedure Evaluation (Signed)
Anesthesia Evaluation  Patient identified by MRN, date of birth, ID band Patient awake    Reviewed: Allergy & Precautions, NPO status , Patient's Chart, lab work & pertinent test results  Airway Mallampati: III  TM Distance: >3 FB Neck ROM: Full    Dental  (+) Upper Dentures, Edentulous Lower   Pulmonary shortness of breath and with exertion, COPD,  COPD inhaler, former smoker,    Pulmonary exam normal breath sounds clear to auscultation       Cardiovascular hypertension, Pt. on medications  Rhythm:Regular Rate:Tachycardia  1. Mild distal anterior and apical hypokinesis. Consider limited echo  with Definity to further define wall motion. . Left ventricular ejection  fraction, by estimation, is 50 to 55%. The left ventricle has low normal  function. The left ventricle has no  regional wall motion abnormalities. There is mild left ventricular  hypertrophy. Indeterminate diastolic filling due to E-A fusion.  2. Right ventricular systolic function is normal. The right ventricular  size is normal.  3. The mitral valve is normal in structure. Trivial mitral valve  regurgitation.  4. The aortic valve is tricuspid. Aortic valve regurgitation is not  visualized. Mild aortic valve sclerosis is present, with no evidence of  aortic valve stenosis.  5. The inferior vena cava is dilated in size with >50% respiratory  variability, suggesting right atrial pressure of 8 mmHg.    Neuro/Psych CVA, Residual Symptoms negative psych ROS   GI/Hepatic Neg liver ROS, GERD  Medicated and Controlled,  Endo/Other  negative endocrine ROS  Renal/GU CRF and Renal InsufficiencyRenal disease  negative genitourinary   Musculoskeletal negative musculoskeletal ROS (+)   Abdominal   Peds negative pediatric ROS (+)  Hematology negative hematology ROS (+) anemia ,   Anesthesia Other Findings   Reproductive/Obstetrics negative OB ROS                             Anesthesia Physical Anesthesia Plan  ASA: 3  Anesthesia Plan: General   Post-op Pain Management:    Induction: Intravenous  PONV Risk Score and Plan: 3 and TIVA  Airway Management Planned: Nasal Cannula, Natural Airway and Simple Face Mask  Additional Equipment:   Intra-op Plan:   Post-operative Plan:   Informed Consent: I have reviewed the patients History and Physical, chart, labs and discussed the procedure including the risks, benefits and alternatives for the proposed anesthesia with the patient or authorized representative who has indicated his/her understanding and acceptance.     Dental advisory given  Plan Discussed with: CRNA and Surgeon  Anesthesia Plan Comments: (Possible GA with airway was explained.)        Anesthesia Quick Evaluation

## 2021-09-11 NOTE — Transfer of Care (Signed)
Immediate Anesthesia Transfer of Care Note  Patient: Ernest Barnett  Procedure(s) Performed: LIGATION OF COMPETING BRANCHES OF RIGHT ARM ARTERIOVENOUS FISTULA (Right: Arm Lower)  Patient Location: Short Stay  Anesthesia Type:General  Level of Consciousness: awake, alert  and oriented  Airway & Oxygen Therapy: Patient Spontanous Breathing  Post-op Assessment: Report given to RN and Post -op Vital signs reviewed and stable  Post vital signs: Reviewed and stable  Last Vitals:  Vitals Value Taken Time  BP    Temp    Pulse    Resp    SpO2      Last Pain:  Vitals:   09/11/21 0722  TempSrc: Oral  PainSc:       Patients Stated Pain Goal: 8 (16/10/96 0454)  Complications: No notable events documented.

## 2021-09-11 NOTE — Anesthesia Postprocedure Evaluation (Signed)
Anesthesia Post Note  Patient: Ernest Barnett  Procedure(s) Performed: LIGATION OF COMPETING BRANCHES OF RIGHT ARM ARTERIOVENOUS FISTULA (Right: Arm Lower)  Patient location during evaluation: PACU Anesthesia Type: General Level of consciousness: awake and alert and oriented Pain management: pain level controlled Vital Signs Assessment: post-procedure vital signs reviewed and stable Respiratory status: spontaneous breathing, nonlabored ventilation and respiratory function stable Cardiovascular status: blood pressure returned to baseline and stable Postop Assessment: no apparent nausea or vomiting Anesthetic complications: no   No notable events documented.   Last Vitals:  Vitals:   09/11/21 0722 09/11/21 0834  BP:  102/74  Pulse:  96  Resp:  12  Temp: 36.6 C 37.2 C  SpO2:  96%    Last Pain:  Vitals:   09/11/21 0834  TempSrc: Oral  PainSc: 0-No pain                 Jerlisa Diliberto C Hilda Rynders

## 2021-09-11 NOTE — Discharge Instructions (Signed)
[facility]  Discharge Instructions  AV Fistula or Graft Surgery for Dialysis Access  Please refer to the following instructions for your post-procedure care. Your surgeon or physician assistant will discuss any changes with you.  Activity  You may drive the day following your surgery, if you are comfortable and no longer taking prescription pain medication. Resume full activity as the soreness in your incision resolves.  Bathing/Showering  You may shower after you go home. Keep your incision dry for 48 hours. Do not soak in a bathtub, hot tub, or swim until the incision heals completely. You may not shower if you have a hemodialysis catheter.  Incision Care  Clean your incision with mild soap and water after 48 hours. Pat the area dry with a clean towel. You do not need a bandage unless otherwise instructed. Do not apply any ointments or creams to your incision. You may have skin glue on your incision. Do not peel it off. It will come off on its own in about one week. Your arm may swell a bit after surgery. To reduce swelling use pillows to elevate your arm so it is above your heart. Your doctor will tell you if you need to lightly wrap your arm with an ACE bandage.  Diet  Resume your normal diet. There are not special food restrictions following this procedure. In order to heal from your surgery, it is CRITICAL to get adequate nutrition. Your body requires vitamins, minerals, and protein. Vegetables are the best source of vitamins and minerals. Vegetables also provide the perfect balance of protein. Processed food has little nutritional value, so try to avoid this.  Medications  Resume taking all of your medications. If your incision is causing pain, you may take over-the counter pain relievers such as acetaminophen (Tylenol). If you were prescribed a stronger pain medication, please be aware these medications can cause nausea and constipation. Prevent  nausea by taking the medication with a snack or meal. Avoid constipation by drinking plenty of fluids and eating foods with high amount of fiber, such as fruits, vegetables, and grains.  Do not take Tylenol if you are taking prescription pain medications.  Follow up Your surgeon may want to see you in the office following your access surgery. If so, this will be arranged at the time of your surgery.  Please call us immediately for any of the following conditions:  Increased pain, redness, drainage (pus) from your incision site Fever of 101 degrees or higher Severe or worsening pain at your incision site Hand pain or numbness.  Reduce your risk of vascular disease:  Stop smoking. If you would like help, call [facility] at 1-800-QUIT-NOW (365-313-5401) or [place] at (769) 575-6570  Manage your cholesterol Maintain a desired weight Control your diabetes Keep your blood pressure down  Dialysis  It will take several weeks to several months for your new dialysis access to be ready for use. Your surgeon will determine when it is okay to use it. Your nephrologist will continue to direct your dialysis. You can continue to use your Permcath until your new access is ready for use.   09/11/2021 HALO WILTFONG [id number] [birth year]  Surgeon(s): Santhosh Gulino, Kristen Loader, MD  Procedure(s): LIGATION OF COMPETING BRANCHES OF RIGHT ARM ARTERIOVENOUS FISTULA   May stick graft immediately   May stick graft on designated area only:    Do not stick fistula for 6 weeks    If you have any questions, please call the  office at [phone].

## 2021-09-11 NOTE — Op Note (Signed)
    OPERATIVE REPORT  DATE OF SURGERY: 09/11/2021  PATIENT: Ernest Barnett, 65 y.o. male MRN: [id number]  DOB: 1956-08-15  PRE-OPERATIVE DIAGNOSIS: End-stage renal disease with competing branches right radiocephalic AV fistula  POST-OPERATIVE DIAGNOSIS:  Same  PROCEDURE: Ligation of competing branches right arm AV fistula  SURGEON:  Curt Jews, M.D.  PHYSICIAN ASSISTANT: Nurse  The assistant was needed for exposure and to expedite the case  ANESTHESIA: Local with sedation  EBL: per anesthesia record  Total I/O In: 600 [I.V.:500; IV Piggyback:100] Out: 1 [Blood:1]  BLOOD ADMINISTERED: none  DRAINS: none  SPECIMEN: none  COUNTS CORRECT:  YES  PATIENT DISPOSITION:  PACU - hemodynamically stable  PROCEDURE DETAILS: The patient was taken the operating placed supine position where the area of the right arm was imaged with SonoSite ultrasound.  There was a large competing branch just above the wrist above the radiocephalic anastomosis.  There was another competing branch in the mid forearm.  These were marked.  The right arm was prepped and draped in usual sterile fashion.  Using local anesthesia, incision was made over both of these branches.  The branches were exposed and were ligated.  The wounds were irrigated with saline.  Hemostasis was obtained with electrocautery.  The wounds were closed with 3-0 Vicryl in the subcutaneous and subcuticular tissue.  Sterile dressing was applied and the patient was transferred to the recovery in stable condition   [name], M.D., [facility] 09/11/2021 8:30 AM  Note: Portions of this report may have been transcribed using voice recognition software.  Every effort has been made to ensure accuracy; however, inadvertent computerized transcription errors may still be present.

## 2021-09-12 ENCOUNTER — Encounter (HOSPITAL_COMMUNITY): Payer: Self-pay | Admitting: Vascular Surgery

## 2021-09-12 DIAGNOSIS — Z992 Dependence on renal dialysis: Secondary | ICD-10-CM | POA: Diagnosis not present

## 2021-09-12 DIAGNOSIS — N186 End stage renal disease: Secondary | ICD-10-CM | POA: Diagnosis not present

## 2021-09-13 ENCOUNTER — Inpatient Hospital Stay (HOSPITAL_COMMUNITY): Payer: Medicare HMO

## 2021-09-13 ENCOUNTER — Other Ambulatory Visit: Payer: Self-pay

## 2021-09-13 VITALS — BP 112/65 | HR 100 | Temp 96.9°F | Resp 20 | Wt 254.6 lb

## 2021-09-13 DIAGNOSIS — C9000 Multiple myeloma not having achieved remission: Secondary | ICD-10-CM

## 2021-09-13 DIAGNOSIS — Z5112 Encounter for antineoplastic immunotherapy: Secondary | ICD-10-CM | POA: Diagnosis not present

## 2021-09-13 LAB — COMPREHENSIVE METABOLIC PANEL
ALT: 12 U/L (ref 0–44)
AST: 18 U/L (ref 15–41)
Albumin: 3.7 g/dL (ref 3.5–5.0)
Alkaline Phosphatase: 25 U/L — ABNORMAL LOW (ref 38–126)
Anion gap: 11 (ref 5–15)
BUN: 28 mg/dL — ABNORMAL HIGH (ref 8–23)
CO2: 27 mmol/L (ref 22–32)
Calcium: 8.9 mg/dL (ref 8.9–10.3)
Chloride: 99 mmol/L (ref 98–111)
Creatinine, Ser: 3.48 mg/dL — ABNORMAL HIGH (ref 0.61–1.24)
GFR, Estimated: 19 mL/min — ABNORMAL LOW (ref >60)
Glucose, Bld: 105 mg/dL — ABNORMAL HIGH (ref 70–99)
Potassium: 3.5 mmol/L (ref 3.5–5.1)
Sodium: 137 mmol/L (ref 135–145)
Total Bilirubin: 0.4 mg/dL (ref 0.3–1.2)
Total Protein: 7.3 g/dL (ref 6.5–8.1)

## 2021-09-13 LAB — CBC WITH DIFFERENTIAL/PLATELET
Abs Immature Granulocytes: 0.02 10*3/uL (ref 0.00–0.07)
Basophils Absolute: 0.1 10*3/uL (ref 0.0–0.1)
Basophils Relative: 1 %
Eosinophils Absolute: 0.1 10*3/uL (ref 0.0–0.5)
Eosinophils Relative: 2 %
HCT: 33.3 % — ABNORMAL LOW (ref 39.0–52.0)
Hemoglobin: 11.2 g/dL — ABNORMAL LOW (ref 13.0–17.0)
Immature Granulocytes: 0 %
Lymphocytes Relative: 27 %
Lymphs Abs: 1.8 10*3/uL (ref 0.7–4.0)
MCH: 32.7 pg (ref 26.0–34.0)
MCHC: 33.6 g/dL (ref 30.0–36.0)
MCV: 97.4 fL (ref 80.0–100.0)
Monocytes Absolute: 1.2 10*3/uL — ABNORMAL HIGH (ref 0.1–1.0)
Monocytes Relative: 18 %
Neutro Abs: 3.4 10*3/uL (ref 1.7–7.7)
Neutrophils Relative %: 52 %
Platelets: 193 10*3/uL (ref 150–400)
RBC: 3.42 MIL/uL — ABNORMAL LOW (ref 4.22–5.81)
RDW: 12.9 % (ref 11.5–15.5)
WBC: 6.5 10*3/uL (ref 4.0–10.5)
nRBC: 0 % (ref 0.0–0.2)

## 2021-09-13 LAB — LACTATE DEHYDROGENASE: LDH: 197 U/L — ABNORMAL HIGH (ref 98–192)

## 2021-09-13 LAB — MAGNESIUM: Magnesium: 2.1 mg/dL (ref 1.7–2.4)

## 2021-09-13 MED ORDER — DARATUMUMAB-HYALURONIDASE-FIHJ 1800-30000 MG-UT/15ML ~~LOC~~ SOLN
1800.0000 mg | Freq: Once | SUBCUTANEOUS | Status: AC
  Administered 2021-09-13: 1800 mg via SUBCUTANEOUS
  Filled 2021-09-13: qty 15

## 2021-09-13 MED ORDER — BORTEZOMIB CHEMO SQ INJECTION 3.5 MG (2.5MG/ML)
1.0000 mg/m2 | Freq: Once | INTRAMUSCULAR | Status: AC
  Administered 2021-09-13: 2.25 mg via SUBCUTANEOUS
  Filled 2021-09-13: qty 0.9

## 2021-09-13 MED ORDER — MONTELUKAST SODIUM 10 MG PO TABS
10.0000 mg | ORAL_TABLET | Freq: Once | ORAL | Status: AC
  Administered 2021-09-13: 10 mg via ORAL
  Filled 2021-09-13: qty 1

## 2021-09-13 MED ORDER — DEXAMETHASONE 4 MG PO TABS
10.0000 mg | ORAL_TABLET | Freq: Once | ORAL | Status: AC
  Administered 2021-09-13: 10 mg via ORAL
  Filled 2021-09-13: qty 3

## 2021-09-13 MED ORDER — ACETAMINOPHEN 325 MG PO TABS
650.0000 mg | ORAL_TABLET | Freq: Once | ORAL | Status: AC
  Administered 2021-09-13: 650 mg via ORAL
  Filled 2021-09-13: qty 2

## 2021-09-13 MED ORDER — DIPHENHYDRAMINE HCL 25 MG PO CAPS
50.0000 mg | ORAL_CAPSULE | Freq: Once | ORAL | Status: AC
  Administered 2021-09-13: 50 mg via ORAL
  Filled 2021-09-13: qty 2

## 2021-09-13 MED ORDER — DENOSUMAB 120 MG/1.7ML ~~LOC~~ SOLN
120.0000 mg | Freq: Once | SUBCUTANEOUS | Status: AC
  Administered 2021-09-13: 120 mg via SUBCUTANEOUS
  Filled 2021-09-13: qty 1.7

## 2021-09-13 NOTE — Patient Instructions (Signed)
[facility]  Discharge Instructions: Thank you for choosing [facility] to provide your oncology and hematology care.  If you have a lab appointment with the [facility], please come in thru the Main Entrance and check in at the main information desk.  Wear comfortable clothing and clothing appropriate for easy access to any Portacath or PICC line.   We strive to give you quality time with your provider. You may need to reschedule your appointment if you arrive late (15 or more minutes).  Arriving late affects you and other patients whose appointments are after yours.  Also, if you miss three or more appointments without notifying the office, you may be dismissed from the clinic at the provider's discretion.      For prescription refill requests, have your pharmacy contact our office and allow 72 hours for refills to be completed.    Today you received the following chemotherapy and/or immunotherapy agents Dara [place]ph[place],Velcade, and Xgeva injection.   To help prevent nausea and vomiting after your treatment, we encourage you to take your nausea medication as directed.  BELOW ARE SYMPTOMS THAT SHOULD BE REPORTED IMMEDIATELY: *FEVER GREATER THAN 100.4 F (38 C) OR HIGHER *CHILLS OR SWEATING *NAUSEA AND VOMITING THAT IS NOT CONTROLLED WITH YOUR NAUSEA MEDICATION *UNUSUAL SHORTNESS OF BREATH *UNUSUAL BRUISING OR BLEEDING *URINARY PROBLEMS (pain or burning when urinating, or frequent urination) *BOWEL PROBLEMS (unusual diarrhea, constipation, pain near the anus) TENDERNESS IN MOUTH AND THROAT WITH OR WITHOUT PRESENCE OF ULCERS (sore throat, sores in mouth, or a toothache) UNUSUAL RASH, SWELLING OR PAIN  UNUSUAL VAGINAL DISCHARGE OR ITCHING   Items with * indicate a potential emergency and should be followed up as soon as possible or go to the Emergency Department if any problems should occur.  Please show the CHEMOTHERAPY ALERT CARD or IMMUNOTHERAPY ALERT CARD at  check-in to the Emergency Department and triage nurse.  Should you have questions after your visit or need to cancel or reschedule your appointment, please contact [facility] [phone]  and follow the prompts.  Office hours are 8:00 a.m. to 4:30 p.m. Monday - Friday. Please note that voicemails left after 4:00 p.m. may not be returned until the following business day.  We are closed weekends and major holidays. You have access to a nurse at all times for urgent questions. Please call the main number to the clinic [phone] and follow the prompts.  For any non-urgent questions, you may also contact your provider using MyChart. We now offer e-Visits for anyone 92 and older to request care online for non-urgent symptoms. For details visit [email or web address].   Also download the MyChart app! Go to the app store, search "MyChart", open the app, select , and log in with your MyChart username and password.  Due to Covid, a mask is required upon entering the hospital/clinic. If you do not have a mask, one will be given to you upon arrival. For doctor visits, patients may have 1 support person aged 55 or older with them. For treatment visits, patients cannot have anyone with them due to current Covid guidelines and our immunocompromised population.

## 2021-09-13 NOTE — Progress Notes (Signed)
Pt presents today for Dara Nicholson, Velcade, and Xgeva injections. Vital signs and other labs WNL for treatment.  Creatinine 3.48 MD made aware. Okay to proceed with treatment today per Dr.K. Pt is okay to restart Xgeva injections today and will schedule from here on out per Dr.K. Calcium is 8.9 Pt is taking Calcium and Vit D as directed. Pt denies any tooth or jaw pain and no recent or future dental appointments at this time.  Dara [place], Velcade,and Xgeva injection given today per MD orders. Tolerated infusion without adverse affects. Vital signs stable. No complaints at this time. Discharged from clinic ambulatory in stable condition. Alert and oriented x 3. F/U with [facility] as scheduled.

## 2021-09-14 DIAGNOSIS — Z992 Dependence on renal dialysis: Secondary | ICD-10-CM | POA: Diagnosis not present

## 2021-09-14 DIAGNOSIS — N186 End stage renal disease: Secondary | ICD-10-CM | POA: Diagnosis not present

## 2021-09-17 ENCOUNTER — Other Ambulatory Visit (HOSPITAL_COMMUNITY): Payer: Self-pay | Admitting: Hematology

## 2021-09-17 DIAGNOSIS — N186 End stage renal disease: Secondary | ICD-10-CM | POA: Diagnosis not present

## 2021-09-17 DIAGNOSIS — Z992 Dependence on renal dialysis: Secondary | ICD-10-CM | POA: Diagnosis not present

## 2021-09-18 ENCOUNTER — Other Ambulatory Visit (HOSPITAL_COMMUNITY): Payer: Medicare HMO

## 2021-09-19 DIAGNOSIS — N186 End stage renal disease: Secondary | ICD-10-CM | POA: Diagnosis not present

## 2021-09-19 DIAGNOSIS — Z992 Dependence on renal dialysis: Secondary | ICD-10-CM | POA: Diagnosis not present

## 2021-09-20 ENCOUNTER — Inpatient Hospital Stay (HOSPITAL_COMMUNITY): Payer: Medicare HMO

## 2021-09-20 ENCOUNTER — Ambulatory Visit (HOSPITAL_COMMUNITY)
Admission: RE | Admit: 2021-09-20 | Discharge: 2021-09-20 | Disposition: A | Discharge status: Home / Self Care | Payer: Medicare HMO | Source: Ambulatory Visit | Attending: Hematology | Admitting: Hematology

## 2021-09-20 ENCOUNTER — Other Ambulatory Visit: Payer: Self-pay

## 2021-09-20 VITALS — BP 118/78 | HR 107 | Temp 97.0°F | Resp 19 | Wt 255.0 lb

## 2021-09-20 DIAGNOSIS — C9000 Multiple myeloma not having achieved remission: Secondary | ICD-10-CM

## 2021-09-20 DIAGNOSIS — Z96642 Presence of left artificial hip joint: Secondary | ICD-10-CM

## 2021-09-20 DIAGNOSIS — M1612 Unilateral primary osteoarthritis, left hip: Secondary | ICD-10-CM | POA: Diagnosis not present

## 2021-09-20 DIAGNOSIS — M533 Sacrococcygeal disorders, not elsewhere classified: Secondary | ICD-10-CM | POA: Diagnosis not present

## 2021-09-20 LAB — LACTATE DEHYDROGENASE: LDH: 207 U/L — ABNORMAL HIGH (ref 98–192)

## 2021-09-20 LAB — COMPREHENSIVE METABOLIC PANEL
ALT: 26 U/L (ref 0–44)
AST: 26 U/L (ref 15–41)
Albumin: 3.8 g/dL (ref 3.5–5.0)
Alkaline Phosphatase: 28 U/L — ABNORMAL LOW (ref 38–126)
Anion gap: 11 (ref 5–15)
BUN: 34 mg/dL — ABNORMAL HIGH (ref 8–23)
CO2: 24 mmol/L (ref 22–32)
Calcium: 8.6 mg/dL — ABNORMAL LOW (ref 8.9–10.3)
Chloride: 100 mmol/L (ref 98–111)
Creatinine, Ser: 3.62 mg/dL — ABNORMAL HIGH (ref 0.61–1.24)
GFR, Estimated: 18 mL/min — ABNORMAL LOW (ref >60)
Glucose, Bld: 122 mg/dL — ABNORMAL HIGH (ref 70–99)
Potassium: 3.3 mmol/L — ABNORMAL LOW (ref 3.5–5.1)
Sodium: 135 mmol/L (ref 135–145)
Total Bilirubin: 0.5 mg/dL (ref 0.3–1.2)
Total Protein: 7.3 g/dL (ref 6.5–8.1)

## 2021-09-20 LAB — CBC WITH DIFFERENTIAL/PLATELET
Abs Immature Granulocytes: 0.07 10*3/uL (ref 0.00–0.07)
Basophils Absolute: 0.1 10*3/uL (ref 0.0–0.1)
Basophils Relative: 1 %
Eosinophils Absolute: 0.1 10*3/uL (ref 0.0–0.5)
Eosinophils Relative: 0 %
HCT: 35.3 % — ABNORMAL LOW (ref 39.0–52.0)
Hemoglobin: 11.5 g/dL — ABNORMAL LOW (ref 13.0–17.0)
Immature Granulocytes: 1 %
Lymphocytes Relative: 14 %
Lymphs Abs: 1.6 10*3/uL (ref 0.7–4.0)
MCH: 32.3 pg (ref 26.0–34.0)
MCHC: 32.6 g/dL (ref 30.0–36.0)
MCV: 99.2 fL (ref 80.0–100.0)
Monocytes Absolute: 1.3 10*3/uL — ABNORMAL HIGH (ref 0.1–1.0)
Monocytes Relative: 11 %
Neutro Abs: 8.5 10*3/uL — ABNORMAL HIGH (ref 1.7–7.7)
Neutrophils Relative %: 73 %
Platelets: 184 10*3/uL (ref 150–400)
RBC: 3.56 MIL/uL — ABNORMAL LOW (ref 4.22–5.81)
RDW: 12.8 % (ref 11.5–15.5)
WBC: 11.6 10*3/uL — ABNORMAL HIGH (ref 4.0–10.5)
nRBC: 0 % (ref 0.0–0.2)

## 2021-09-20 LAB — MAGNESIUM: Magnesium: 2 mg/dL (ref 1.7–2.4)

## 2021-09-20 MED ORDER — ACETAMINOPHEN 325 MG PO TABS
650.0000 mg | ORAL_TABLET | Freq: Once | ORAL | Status: AC
  Administered 2021-09-20: 10:00:00 650 mg via ORAL
  Filled 2021-09-20: qty 2

## 2021-09-20 MED ORDER — DARATUMUMAB-HYALURONIDASE-FIHJ 1800-30000 MG-UT/15ML ~~LOC~~ SOLN
1800.0000 mg | Freq: Once | SUBCUTANEOUS | Status: AC
  Administered 2021-09-20: 11:00:00 1800 mg via SUBCUTANEOUS
  Filled 2021-09-20: qty 15

## 2021-09-20 MED ORDER — BORTEZOMIB CHEMO SQ INJECTION 3.5 MG (2.5MG/ML)
1.0000 mg/m2 | Freq: Once | INTRAMUSCULAR | Status: AC
  Administered 2021-09-20: 11:00:00 2.25 mg via SUBCUTANEOUS
  Filled 2021-09-20: qty 0.9

## 2021-09-20 MED ORDER — DIPHENHYDRAMINE HCL 25 MG PO CAPS
50.0000 mg | ORAL_CAPSULE | Freq: Once | ORAL | Status: AC
  Administered 2021-09-20: 10:00:00 50 mg via ORAL
  Filled 2021-09-20: qty 2

## 2021-09-20 MED ORDER — DEXAMETHASONE 4 MG PO TABS
10.0000 mg | ORAL_TABLET | Freq: Once | ORAL | Status: AC
  Administered 2021-09-20: 10:00:00 10 mg via ORAL
  Filled 2021-09-20: qty 3

## 2021-09-20 NOTE — Progress Notes (Signed)
Pt presents today for Ernest Barnett and Ernest Barnett injection per provider's order. Vital signs and other labs WNL for treatment. Pt's creatinine was 3.62 Potassium 3.3 per Dr.K's standing order 23mEq p.o x 1 dose to be given but pt refused due to him being on dialysis. Pt's HR is 107 okay for treatment. Okay for treatment today.  Ernest Barnett and Ernest Barnett injection given today per MD orders. Tolerated infusion without adverse affects. Vital signs stable. No complaints at this time. Discharged from clinic ambulatory in stable condition. Alert and oriented x 3. F/U with [facility] as scheduled.

## 2021-09-20 NOTE — Patient Instructions (Signed)
[facility]  Discharge Instructions: Thank you for choosing [facility] to provide your oncology and hematology care.  If you have a lab appointment with the [facility], please come in thru the Main Entrance and check in at the main information desk.  Wear comfortable clothing and clothing appropriate for easy access to any Portacath or PICC line.   We strive to give you quality time with your provider. You may need to reschedule your appointment if you arrive late (15 or more minutes).  Arriving late affects you and other patients whose appointments are after yours.  Also, if you miss three or more appointments without notifying the office, you may be dismissed from the clinic at the provider's discretion.      For prescription refill requests, have your pharmacy contact our office and allow 72 hours for refills to be completed.    Today you received the following chemotherapy and/or immunotherapy agents Dara [place] and Velcade injection   To help prevent nausea and vomiting after your treatment, we encourage you to take your nausea medication as directed.  BELOW ARE SYMPTOMS THAT SHOULD BE REPORTED IMMEDIATELY: *FEVER GREATER THAN 100.4 F (38 C) OR HIGHER *CHILLS OR SWEATING *NAUSEA AND VOMITING THAT IS NOT CONTROLLED WITH YOUR NAUSEA MEDICATION *UNUSUAL SHORTNESS OF BREATH *UNUSUAL BRUISING OR BLEEDING *URINARY PROBLEMS (pain or burning when urinating, or frequent urination) *BOWEL PROBLEMS (unusual diarrhea, constipation, pain near the anus) TENDERNESS IN MOUTH AND THROAT WITH OR WITHOUT PRESENCE OF ULCERS (sore throat, sores in mouth, or a toothache) UNUSUAL RASH, SWELLING OR PAIN  UNUSUAL VAGINAL DISCHARGE OR ITCHING   Items with * indicate a potential emergency and should be followed up as soon as possible or go to the Emergency Department if any problems should occur.  Please show the CHEMOTHERAPY ALERT CARD or IMMUNOTHERAPY ALERT CARD at check-in to the  Emergency Department and triage nurse.  Should you have questions after your visit or need to cancel or reschedule your appointment, please contact [facility] [phone]  and follow the prompts.  Office hours are 8:00 a.m. to 4:30 p.m. Monday - Friday. Please note that voicemails left after 4:00 p.m. may not be returned until the following business day.  We are closed weekends and major holidays. You have access to a nurse at all times for urgent questions. Please call the main number to the clinic [phone] and follow the prompts.  For any non-urgent questions, you may also contact your provider using MyChart. We now offer e-Visits for anyone 75 and older to request care online for non-urgent symptoms. For details visit [email or web address].   Also download the MyChart app! Go to the app store, search "MyChart", open the app, select [facility], and log in with your MyChart username and password.  Due to Covid, a mask is required upon entering the hospital/clinic. If you do not have a mask, one will be given to you upon arrival. For doctor visits, patients may have 1 support person aged 19 or older with them. For treatment visits, patients cannot have anyone with them due to current Covid guidelines and our immunocompromised population.

## 2021-09-21 DIAGNOSIS — Z992 Dependence on renal dialysis: Secondary | ICD-10-CM | POA: Diagnosis not present

## 2021-09-21 DIAGNOSIS — N186 End stage renal disease: Secondary | ICD-10-CM | POA: Diagnosis not present

## 2021-09-21 LAB — KAPPA/LAMBDA LIGHT CHAINS
Kappa free light chain: 76.6 mg/L — ABNORMAL HIGH (ref 3.3–19.4)
Kappa, lambda light chain ratio: 8.06 — ABNORMAL HIGH (ref 0.26–1.65)
Lambda free light chains: 9.5 mg/L (ref 5.7–26.3)

## 2021-09-24 DIAGNOSIS — N186 End stage renal disease: Secondary | ICD-10-CM | POA: Diagnosis not present

## 2021-09-24 DIAGNOSIS — Z992 Dependence on renal dialysis: Secondary | ICD-10-CM | POA: Diagnosis not present

## 2021-09-25 ENCOUNTER — Other Ambulatory Visit (HOSPITAL_COMMUNITY): Payer: Medicare HMO

## 2021-09-25 ENCOUNTER — Other Ambulatory Visit: Payer: Self-pay

## 2021-09-25 ENCOUNTER — Ambulatory Visit: Payer: Medicare HMO | Admitting: Nurse Practitioner

## 2021-09-25 ENCOUNTER — Encounter (HOSPITAL_COMMUNITY): Payer: Self-pay

## 2021-09-25 ENCOUNTER — Ambulatory Visit (HOSPITAL_COMMUNITY): Payer: Medicare HMO

## 2021-09-25 ENCOUNTER — Inpatient Hospital Stay (HOSPITAL_COMMUNITY): Payer: Medicare HMO

## 2021-09-25 VITALS — BP 109/71 | HR 103 | Temp 99.6°F | Resp 20 | Wt 252.0 lb

## 2021-09-25 DIAGNOSIS — Z5112 Encounter for antineoplastic immunotherapy: Secondary | ICD-10-CM | POA: Diagnosis not present

## 2021-09-25 DIAGNOSIS — C9000 Multiple myeloma not having achieved remission: Secondary | ICD-10-CM

## 2021-09-25 LAB — PROTEIN ELECTROPHORESIS, SERUM
A/G Ratio: 1.4 (ref 0.7–1.7)
Albumin ELP: 4.2 g/dL (ref 2.9–4.4)
Alpha-1-Globulin: 0.2 g/dL (ref 0.0–0.4)
Alpha-2-Globulin: 0.8 g/dL (ref 0.4–1.0)
Beta Globulin: 0.8 g/dL (ref 0.7–1.3)
Gamma Globulin: 1.2 g/dL (ref 0.4–1.8)
Globulin, Total: 3 g/dL (ref 2.2–3.9)
M-Spike, %: 0.8 g/dL — ABNORMAL HIGH
Total Protein ELP: 7.2 g/dL (ref 6.0–8.5)

## 2021-09-25 LAB — IMMUNOFIXATION ELECTROPHORESIS
IgA: 19 mg/dL — ABNORMAL LOW (ref 61–437)
IgG (Immunoglobin G), Serum: 1375 mg/dL (ref 603–1613)
IgM (Immunoglobulin M), Srm: 7 mg/dL — ABNORMAL LOW (ref 20–172)
Total Protein ELP: 7.2 g/dL (ref 6.0–8.5)

## 2021-09-25 LAB — COMPREHENSIVE METABOLIC PANEL
ALT: 42 U/L (ref 0–44)
AST: 54 U/L — ABNORMAL HIGH (ref 15–41)
Albumin: 3.8 g/dL (ref 3.5–5.0)
Alkaline Phosphatase: 36 U/L — ABNORMAL LOW (ref 38–126)
Anion gap: 10 (ref 5–15)
BUN: 34 mg/dL — ABNORMAL HIGH (ref 8–23)
CO2: 26 mmol/L (ref 22–32)
Calcium: 8.4 mg/dL — ABNORMAL LOW (ref 8.9–10.3)
Chloride: 99 mmol/L (ref 98–111)
Creatinine, Ser: 3.77 mg/dL — ABNORMAL HIGH (ref 0.61–1.24)
GFR, Estimated: 17 mL/min — ABNORMAL LOW (ref >60)
Glucose, Bld: 102 mg/dL — ABNORMAL HIGH (ref 70–99)
Potassium: 3.8 mmol/L (ref 3.5–5.1)
Sodium: 135 mmol/L (ref 135–145)
Total Bilirubin: 0.6 mg/dL (ref 0.3–1.2)
Total Protein: 7.2 g/dL (ref 6.5–8.1)

## 2021-09-25 LAB — CBC WITH DIFFERENTIAL/PLATELET
Abs Immature Granulocytes: 0.02 10*3/uL (ref 0.00–0.07)
Basophils Absolute: 0 10*3/uL (ref 0.0–0.1)
Basophils Relative: 0 %
Eosinophils Absolute: 0.1 10*3/uL (ref 0.0–0.5)
Eosinophils Relative: 1 %
HCT: 32.3 % — ABNORMAL LOW (ref 39.0–52.0)
Hemoglobin: 10.9 g/dL — ABNORMAL LOW (ref 13.0–17.0)
Immature Granulocytes: 0 %
Lymphocytes Relative: 20 %
Lymphs Abs: 1.4 10*3/uL (ref 0.7–4.0)
MCH: 32.6 pg (ref 26.0–34.0)
MCHC: 33.7 g/dL (ref 30.0–36.0)
MCV: 96.7 fL (ref 80.0–100.0)
Monocytes Absolute: 0.9 10*3/uL (ref 0.1–1.0)
Monocytes Relative: 14 %
Neutro Abs: 4.3 10*3/uL (ref 1.7–7.7)
Neutrophils Relative %: 65 %
Platelets: 195 10*3/uL (ref 150–400)
RBC: 3.34 MIL/uL — ABNORMAL LOW (ref 4.22–5.81)
RDW: 13.1 % (ref 11.5–15.5)
WBC: 6.7 10*3/uL (ref 4.0–10.5)
nRBC: 0 % (ref 0.0–0.2)

## 2021-09-25 LAB — MAGNESIUM: Magnesium: 1.9 mg/dL (ref 1.7–2.4)

## 2021-09-25 LAB — LACTATE DEHYDROGENASE: LDH: 231 U/L — ABNORMAL HIGH (ref 98–192)

## 2021-09-25 MED ORDER — ACETAMINOPHEN 325 MG PO TABS
650.0000 mg | ORAL_TABLET | Freq: Once | ORAL | Status: AC
  Administered 2021-09-25: 650 mg via ORAL
  Filled 2021-09-25: qty 2

## 2021-09-25 MED ORDER — DIPHENHYDRAMINE HCL 25 MG PO CAPS
50.0000 mg | ORAL_CAPSULE | Freq: Once | ORAL | Status: AC
  Administered 2021-09-25: 50 mg via ORAL
  Filled 2021-09-25: qty 2

## 2021-09-25 MED ORDER — DEXAMETHASONE 4 MG PO TABS
10.0000 mg | ORAL_TABLET | Freq: Once | ORAL | Status: AC
  Administered 2021-09-25: 10 mg via ORAL
  Filled 2021-09-25: qty 3

## 2021-09-25 MED ORDER — BORTEZOMIB CHEMO SQ INJECTION 3.5 MG (2.5MG/ML)
1.0000 mg/m2 | Freq: Once | INTRAMUSCULAR | Status: AC
  Administered 2021-09-25: 2.25 mg via SUBCUTANEOUS
  Filled 2021-09-25: qty 0.9

## 2021-09-25 MED ORDER — DARATUMUMAB-HYALURONIDASE-FIHJ 1800-30000 MG-UT/15ML ~~LOC~~ SOLN
1800.0000 mg | Freq: Once | SUBCUTANEOUS | Status: AC
  Administered 2021-09-25: 1800 mg via SUBCUTANEOUS
  Filled 2021-09-25: qty 15

## 2021-09-25 NOTE — Progress Notes (Signed)
Patient presents today for Velcade/Daratumumab/Dexamethasone. Patient's Ser. Creatine 3.77, on-call MD Dr. Benay Spice made aware of patient's creatinine. Per Dr. Benay Spice patient is okay for treatment today.  Patient tolerated Velcade injection with no complaints voiced. Lab work reviewed. See MAR for details. Injection site clean and dry with no bruising or swelling noted. Patient stable during and after injection. Band aid applied.  Patient tolerated Daratumumab injection with no complaints voiced. See MAR for details. Lab reviewed. Injection site clean and dry with no bruising or swelling noted at site. Band aid applied. Patient declined AVS. Vss with discharge and left in satisfactory condition with nos/s of distress noted.

## 2021-09-25 NOTE — Patient Instructions (Signed)
[facility]  Discharge Instructions: Thank you for choosing [facility] to provide your oncology and hematology care.  If you have a lab appointment with the [facility], please come in thru the Main Entrance and check in at the main information desk.  Wear comfortable clothing and clothing appropriate for easy access to any Portacath or PICC line.   We strive to give you quality time with your provider. You may need to reschedule your appointment if you arrive late (15 or more minutes).  Arriving late affects you and other patients whose appointments are after yours.  Also, if you miss three or more appointments without notifying the office, you may be dismissed from the clinic at the provider's discretion.      For prescription refill requests, have your pharmacy contact our office and allow 72 hours for refills to be completed.    Today you received the following chemotherapy and/or immunotherapy agents Velcade and Daratumumab, return as scheduled.   To help prevent nausea and vomiting after your treatment, we encourage you to take your nausea medication as directed.  BELOW ARE SYMPTOMS THAT SHOULD BE REPORTED IMMEDIATELY: *FEVER GREATER THAN 100.4 F (38 C) OR HIGHER *CHILLS OR SWEATING *NAUSEA AND VOMITING THAT IS NOT CONTROLLED WITH YOUR NAUSEA MEDICATION *UNUSUAL SHORTNESS OF BREATH *UNUSUAL BRUISING OR BLEEDING *URINARY PROBLEMS (pain or burning when urinating, or frequent urination) *BOWEL PROBLEMS (unusual diarrhea, constipation, pain near the anus) TENDERNESS IN MOUTH AND THROAT WITH OR WITHOUT PRESENCE OF ULCERS (sore throat, sores in mouth, or a toothache) UNUSUAL RASH, SWELLING OR PAIN  UNUSUAL VAGINAL DISCHARGE OR ITCHING   Items with * indicate a potential emergency and should be followed up as soon as possible or go to the Emergency Department if any problems should occur.  Please show the CHEMOTHERAPY ALERT CARD or IMMUNOTHERAPY ALERT CARD at  check-in to the Emergency Department and triage nurse.  Should you have questions after your visit or need to cancel or reschedule your appointment, please contact [facility] [phone]  and follow the prompts.  Office hours are 8:00 a.m. to 4:30 p.m. Monday - Friday. Please note that voicemails left after 4:00 p.m. may not be returned until the following business day.  We are closed weekends and major holidays. You have access to a nurse at all times for urgent questions. Please call the main number to the clinic [phone] and follow the prompts.  For any non-urgent questions, you may also contact your provider using MyChart. We now offer e-Visits for anyone 29 and older to request care online for non-urgent symptoms. For details visit [email or web address].   Also download the MyChart app! Go to the app store, search "MyChart", open the app, select [facility], and log in with your MyChart username and password.  Due to Covid, a mask is required upon entering the hospital/clinic. If you do not have a mask, one will be given to you upon arrival. For doctor visits, patients may have 1 support person aged 72 or older with them. For treatment visits, patients cannot have anyone with them due to current Covid guidelines and our immunocompromised population.

## 2021-09-26 DIAGNOSIS — Z992 Dependence on renal dialysis: Secondary | ICD-10-CM | POA: Diagnosis not present

## 2021-09-26 DIAGNOSIS — N186 End stage renal disease: Secondary | ICD-10-CM | POA: Diagnosis not present

## 2021-09-26 LAB — KAPPA/LAMBDA LIGHT CHAINS
Kappa free light chain: 71.8 mg/L — ABNORMAL HIGH (ref 3.3–19.4)
Kappa, lambda light chain ratio: 7.18 — ABNORMAL HIGH (ref 0.26–1.65)
Lambda free light chains: 10 mg/L (ref 5.7–26.3)

## 2021-09-28 DIAGNOSIS — Z992 Dependence on renal dialysis: Secondary | ICD-10-CM | POA: Diagnosis not present

## 2021-09-28 DIAGNOSIS — N186 End stage renal disease: Secondary | ICD-10-CM | POA: Diagnosis not present

## 2021-10-01 ENCOUNTER — Ambulatory Visit (HOSPITAL_COMMUNITY): Payer: Medicare HMO | Admitting: Hematology

## 2021-10-01 ENCOUNTER — Ambulatory Visit (HOSPITAL_COMMUNITY): Payer: Medicare HMO

## 2021-10-01 ENCOUNTER — Other Ambulatory Visit (HOSPITAL_COMMUNITY): Payer: Medicare HMO

## 2021-10-01 ENCOUNTER — Ambulatory Visit: Payer: Medicare HMO | Admitting: Family Medicine

## 2021-10-01 DIAGNOSIS — Z992 Dependence on renal dialysis: Secondary | ICD-10-CM | POA: Diagnosis not present

## 2021-10-01 DIAGNOSIS — N186 End stage renal disease: Secondary | ICD-10-CM | POA: Diagnosis not present

## 2021-10-01 LAB — IMMUNOFIXATION ELECTROPHORESIS
IgA: 21 mg/dL — ABNORMAL LOW (ref 61–437)
IgG (Immunoglobin G), Serum: 1222 mg/dL (ref 603–1613)
IgM (Immunoglobulin M), Srm: 8 mg/dL — ABNORMAL LOW (ref 20–172)
Total Protein ELP: 7 g/dL (ref 6.0–8.5)

## 2021-10-02 ENCOUNTER — Ambulatory Visit (HOSPITAL_COMMUNITY): Payer: Medicare HMO | Admitting: Hematology

## 2021-10-02 ENCOUNTER — Ambulatory Visit (HOSPITAL_COMMUNITY): Payer: Medicare HMO

## 2021-10-02 ENCOUNTER — Other Ambulatory Visit (HOSPITAL_COMMUNITY): Payer: Medicare HMO

## 2021-10-02 LAB — PROTEIN ELECTROPHORESIS, SERUM
A/G Ratio: 1.1 (ref 0.7–1.7)
Albumin ELP: 3.7 g/dL (ref 2.9–4.4)
Alpha-1-Globulin: 0.3 g/dL (ref 0.0–0.4)
Alpha-2-Globulin: 0.9 g/dL (ref 0.4–1.0)
Beta Globulin: 0.9 g/dL (ref 0.7–1.3)
Gamma Globulin: 1.3 g/dL (ref 0.4–1.8)
Globulin, Total: 3.4 g/dL (ref 2.2–3.9)
M-Spike, %: 0.8 g/dL — ABNORMAL HIGH
Total Protein ELP: 7.1 g/dL (ref 6.0–8.5)

## 2021-10-03 DIAGNOSIS — Z992 Dependence on renal dialysis: Secondary | ICD-10-CM | POA: Diagnosis not present

## 2021-10-03 DIAGNOSIS — N186 End stage renal disease: Secondary | ICD-10-CM | POA: Diagnosis not present

## 2021-10-03 NOTE — Progress Notes (Signed)
Ernest Barnett, [place] [address]   CLINIC:  Medical Oncology/Hematology  PCP:  Noreene Larsson, NP [address]  [address] / [place] [address] [phone]   REASON FOR VISIT:  Follow-up for multiple myeloma  PRIOR THERAPY: CyBorD  NGS Results: not done  CURRENT THERAPY: Velcade, daratumumab and dexamethasone  BRIEF ONCOLOGIC HISTORY:  Oncology History  Multiple myeloma ([place])  03/06/2021 Initial Diagnosis   Multiple myeloma ([place])   03/06/2021 Cancer Staging   Staging form: Plasma Cell Myeloma and Plasma Cell Disorders, AJCC 8th Edition - Clinical stage from 03/06/2021: RISS Stage II (Beta-2-microglobulin (mg/L): 5.5, Albumin (g/dL): 3.5, ISS: Stage III, High-risk cytogenetics: Absent, LDH: Normal) - Signed by Derek Jack, MD on 03/06/2021 Stage prefix: Initial diagnosis Beta 2 microglobulin range (mg/L): Greater than or equal to 5.5 Albumin range (g/dL): Greater than or equal to 3.5 Cytogenetics: No abnormalities Pretreatment monoclonal protein level in serum (M spike) (g/dL): 2 Pretreatment serum free kappa light chain level (g/L): 794 Pretreatment serum free lambda light chain level (g/L): 15.9    03/14/2021 - 07/04/2021 Chemotherapy   Patient is on Treatment Plan : MULTIPLE MYELOMA CyBorD - Weekly Bortezomib     08/30/2021 -  Chemotherapy   Patient is on Treatment Plan : MYELOMA RELAPSED / REFRACTORY Daratumumab SQ + Bortezomib + Dexamethasone (DaraVd) q21d / Daratumumab SQ q28d        CANCER STAGING:  Cancer Staging  Multiple myeloma ([place]) Staging form: Plasma Cell Myeloma and Plasma Cell Disorders, AJCC 8th Edition - Clinical stage from 03/06/2021: RISS Stage II (Beta-2-microglobulin (mg/L): 5.5, Albumin (g/dL): 3.5, ISS: Stage III, High-risk cytogenetics: Absent, LDH: Normal) - Signed by Derek Jack, MD on 03/06/2021   INTERVAL HISTORY:  [name], a 65 y.o. male, returns for routine follow-up and  consideration for next cycle of chemotherapy. Ernest Barnett was last seen on 08/28/2021.  Due for day #15 cycle #2 of Velcade, daratumumab and dexamethasone today.   Overall, he tells me he has been feeling pretty well. His appetite is good. He reports his leg swellings have improved. He denies n/v/d/c. He reports he is urinating every 2 hours.   Overall, he feels ready for next cycle of chemo today.   REVIEW OF SYSTEMS:  Review of Systems  Constitutional:  Positive for fatigue (75%). Negative for appetite change.  Respiratory:  Positive for shortness of breath.   Cardiovascular:  Negative for leg swelling (improved).  Gastrointestinal:  Negative for constipation, diarrhea, nausea and vomiting.  Neurological:  Positive for numbness (feet).  Psychiatric/Behavioral:  Positive for sleep disturbance.   All other systems reviewed and are negative.  PAST MEDICAL/SURGICAL HISTORY:  Past Medical History:  Diagnosis Date   Chronic kidney disease    COPD (chronic obstructive pulmonary disease) ([place])    CVA (cerebral vascular accident) ([place]) 2013   Cystoid macular edema of left eye 03/07/2021   GERD (gastroesophageal reflux disease)    HOH (hard of hearing)    Hypercholesteremia    Hypertension    Left epiretinal membrane 03/07/2021   Pseudophakia 03/07/2021   Stroke ([place]) 03/24/2012   left sided weakness   Vitamin D deficiency    Past Surgical History:  Procedure Laterality Date   AV FISTULA PLACEMENT Right 02/08/2021   Procedure: RIGHT ARM ARTERIOVENOUS (AV) FISTULA CREATION;  Surgeon: [name], MD;  Location: [facility];  Service: Vascular;  Laterality: Right;   BIOPSY  11/12/2018   Procedure: BIOPSY;  Surgeon: Rogene Houston, MD;  Location: [facility];  Service: Endoscopy;;  colon   BIOPSY  05/10/2020   Procedure: BIOPSY;  Surgeon: Rogene Houston, MD;  Location: [facility];  Service: Endoscopy;;  esophagus   CATARACT EXTRACTION     right eye   CATARACT EXTRACTION [facility]  10/08/2012    Procedure: CATARACT EXTRACTION PHACO AND INTRAOCULAR LENS PLACEMENT ([place]);  Surgeon: Tonny Branch, MD;  Location: [facility];  Service: Ophthalmology;  Laterality: Left;  CDE:6.64   CHOLECYSTECTOMY     [place]   COLONOSCOPY N/A 11/12/2018   Procedure: COLONOSCOPY;  Surgeon: Rogene Houston, MD;  Location: [facility];  Service: Endoscopy;  Laterality: N/A;  1030   ELBOW FRACTURE SURGERY     left   ESOPHAGEAL DILATION N/A 11/12/2018   Procedure: ESOPHAGEAL DILATION;  Surgeon: Rogene Houston, MD;  [facility];  Service: Endoscopy;  Laterality: N/A;   ESOPHAGEAL DILATION N/A 05/10/2020   Procedure: ESOPHAGEAL DILATION;  Surgeon: Rogene Houston, MD;  [facility];  Service: Endoscopy;  Laterality: N/A;   ESOPHAGOGASTRODUODENOSCOPY N/A 11/12/2018   Procedure: ESOPHAGOGASTRODUODENOSCOPY (EGD);  Surgeon: Rogene Houston, MD;  [facility];  Service: Endoscopy;  Laterality: N/A;   ESOPHAGOGASTRODUODENOSCOPY N/A 05/10/2020   Procedure: ESOPHAGOGASTRODUODENOSCOPY (EGD);  Surgeon: Rogene Houston, MD;  [facility];  Service: Endoscopy;  Laterality: N/A;  210   HERNIA REPAIR     right inguinal   HYDROCELE EXCISION / REPAIR     IR FLUORO GUIDE CV LINE LEFT  08/17/2021   IR US GUIDE VASC ACCESS LEFT  08/17/2021   LIGATION OF COMPETING BRANCHES OF ARTERIOVENOUS FISTULA Right 09/11/2021   Procedure: LIGATION OF COMPETING BRANCHES OF RIGHT ARM ARTERIOVENOUS FISTULA;  Surgeon: [name], MD;  Location: [facility];  Service: Vascular;  Laterality: Right;   POLYPECTOMY  11/12/2018   Procedure: POLYPECTOMY;  Surgeon: Rogene Houston, MD;  [facility];  Service: Endoscopy;;  colon    RETINAL DETACHMENT SURGERY Left 2019   SPLENECTOMY, TOTAL     TONSILLECTOMY     VOCAL CORD INJECTION     removal of polyp-2005    SOCIAL HISTORY:  Social History   Socioeconomic History   Marital status: Significant Other    Spouse name: Not on file   Number of children: 2    Years of education: Not on file   Highest education level: Not on file  Occupational History   Occupation: Disability since having a stroke    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  Tobacco Use   Smoking status: Former    Packs/day: 3.00    Years: 30.00    Pack years: 90.00    Types: Cigarettes   Smokeless tobacco: Current   Tobacco comments:    Currently vape  Vaping Use   Vaping Use: Every day   Substances: Nicotine  Substance and Sexual Activity   Alcohol use: Yes    Alcohol/week: 1.0 - 2.0 standard drink    Types: 1 - 2 Cans of beer per week    Comment: Occasionally   Drug use: No   Sexual activity: Not Currently    Birth control/protection: Abstinence, None  Other Topics Concern   Not on file  Social History Narrative   2 children, both nearby   Social Determinants of Health   Financial Resource Strain: Low Risk    Difficulty of Paying Living Expenses: Not hard at all  Food Insecurity: No Food Insecurity   Worried About Running Out of Food in the Last Year: Never true   Ran Out of Food in the Last Year: Never true  Transportation Needs: No Transportation Needs   Lack of Transportation (Medical): No   Lack of Transportation (Non-Medical): No  Physical Activity: Insufficiently Active   Days of Exercise per Week: 3 days   Minutes of Exercise per Session: 30 min  Stress: No Stress Concern Present   Feeling of Stress : Only a little  Social Connections: Socially Isolated   Frequency of Communication with Friends and Family: More than three times a week   Frequency of Social Gatherings with Friends and Family: More than three times a week   Attends Religious Services: Never   Active Member of Clubs or Organizations: No   Attends Club or Organization Meetings: Never   Marital Status: Divorced  Intimate Partner Violence: Not At Risk   Fear of Current or Ex-Partner: No   Emotionally Abused: No   Physically Abused: No   Sexually Abused: No    FAMILY HISTORY:  History  reviewed. No pertinent family history.  CURRENT MEDICATIONS:  Current Outpatient Medications  Medication Sig Dispense Refill   aspirin EC 81 MG tablet Take 1 tablet (81 mg total) by mouth daily. 30 tablet 11   bortezomib IV (VELCADE) 3.5 MG injection Inject 1.5 mg/m2 into the vein once a week.     calcitRIOL (ROCALTROL) 0.25 MCG capsule Take 0.25 mcg by mouth daily.     cholecalciferol (VITAMIN D3) 25 MCG (1000 UNIT) tablet Take 2,000 Units by mouth daily.     CYCLOPHOSPHAMIDE IV Inject 300 mg/m2 into the vein once a week.     dexamethasone 40 mg in sodium chloride 0.9 % 50 mL Inject 40 mg into the vein once a week.     docusate sodium (COLACE) 100 MG capsule Take 100 mg by mouth daily.     gabapentin (NEURONTIN) 300 MG capsule Take 1 capsule (300 mg total) by mouth 2 (two) times daily. 60 capsule 1   midodrine (PROAMATINE) 10 MG tablet Take by mouth.     Omega-3 Fatty Acids (FISH OIL) 1000 MG CAPS Take 1,000 mg by mouth in the morning, at noon, and at bedtime.      oxyCODONE (OXY IR/ROXICODONE) 5 MG immediate release tablet Take 1 tablet (5 mg total) by mouth 2 (two) times daily as needed for severe pain. 30 tablet 0   pantoprazole (PROTONIX) 40 MG tablet Take 1 tablet (40 mg total) by mouth 2 (two) times daily. 180 tablet 3   polyethylene glycol (MIRALAX / GLYCOLAX) packet Take 17 g by mouth daily. Patient states that he takes as needed. 30 each 5   pravastatin (PRAVACHOL) 80 MG tablet Take 1 tablet (80 mg total) by mouth daily. 90 tablet 3   sodium bicarbonate 325 MG tablet Take 325 mg by mouth 3 (three) times daily.     tamsulosin (FLOMAX) 0.4 MG CAPS capsule Take 0.4 mg by mouth daily.      tiZANidine (ZANAFLEX) 2 MG tablet Take 1 tablet (2 mg total) by mouth every 6 (six) hours as needed for muscle spasms. 30 tablet 1   torsemide (DEMADEX) 100 MG tablet Take 100 mg by mouth 2 (two) times daily.     albuterol (VENTOLIN HFA) 108 (90 Base) MCG/ACT inhaler Inhale 2 puffs into the lungs every  6 (six) hours as needed for wheezing or shortness of breath. (Patient not taking:   Reported on 09/20/2021) 8 g 0   budesonide-formoterol (SYMBICORT) 80-4.5 MCG/ACT inhaler Inhale 2 puffs into the lungs 2 (two) times daily as needed (shortness of breath). (Patient not taking: Reported on 09/20/2021)     No current facility-administered medications for this visit.   Facility-Administered Medications Ordered in Other Visits  Medication Dose Route Frequency Provider Last Rate Last Admin   palonosetron (ALOXI) 0.25 MG/5ML injection             ALLERGIES:  No Known Allergies  PHYSICAL EXAM:  Performance status (ECOG): 1 - Symptomatic but completely ambulatory  Vitals:   10/04/21 1043  BP: 116/89  Pulse: (!) 101  Resp: 20  Temp: 98.8 F (37.1 C)  SpO2: 95%   Wt Readings from Last 3 Encounters:  10/04/21 250 lb 6 oz (113.6 kg)  09/25/21 252 lb (114.3 kg)  09/20/21 255 lb (115.7 kg)   Physical Exam Vitals reviewed.  Constitutional:      Appearance: Normal appearance.  Cardiovascular:     Rate and Rhythm: Normal rate and regular rhythm.     Pulses: Normal pulses.     Heart sounds: Normal heart sounds.  Pulmonary:     Effort: Pulmonary effort is normal.     Breath sounds: Normal breath sounds.  Neurological:     General: No focal deficit present.     Mental Status: He is alert and oriented to person, place, and time.  Psychiatric:        Mood and Affect: Mood normal.        Behavior: Behavior normal.    LABORATORY DATA:  I have reviewed the labs as listed.  CBC Latest Ref Rng & Units 10/04/2021 09/25/2021 09/20/2021  WBC 4.0 - 10.5 K/uL 6.6 6.7 11.6(H)  Hemoglobin 13.0 - 17.0 g/dL 11.6(L) 10.9(L) 11.5(L)  Hematocrit 39.0 - 52.0 % 34.4(L) 32.3(L) 35.3(L)  Platelets 150 - 400 K/uL 213 195 184   CMP Latest Ref Rng & Units 10/04/2021 09/25/2021 09/20/2021  Glucose 70 - 99 mg/dL 102(H) 102(H) 122(H)  BUN 8 - 23 mg/dL 21 34(H) 34(H)  Creatinine 0.61 - 1.24 mg/dL 3.43(H)  3.77(H) 3.62(H)  Sodium 135 - 145 mmol/L 133(L) 135 135  Potassium 3.5 - 5.1 mmol/L 3.5 3.8 3.3(L)  Chloride 98 - 111 mmol/L 98 99 100  CO2 22 - 32 mmol/L _0 Calcium 8.9 - 10.3 mg/dL 8.5(L) 8.4(L) 8.6(L)  Total Protein 6.5 - 8.1 g/dL 7.2 7.2 7.3  Total Bilirubin 0.3 - 1.2 mg/dL 0.3 0.6 0.5  Alkaline Phos 38 - 126 U/L 32(L) 36(L) 28(L)  AST 15 - 41 U/L 22 54(H) 26  ALT 0 - 44 U/L 33 42 26    DIAGNOSTIC IMAGING:  I have independently reviewed the scans and discussed with the patient. MR HIP LEFT WO CONTRAST  Result Date: 09/21/2021 CLINICAL DATA:  Left hip pain for 3 weeks. No known injury. History of multiple myeloma. EXAM: MR OF THE LEFT HIP WITHOUT CONTRAST TECHNIQUE: Multiplanar, multisequence MR imaging was performed. No intravenous contrast was administered. COMPARISON:  03/08/2021 FINDINGS: Technical Note: Despite efforts by the technologist and patient, motion artifact is present on today's exam and could not be eliminated. This reduces exam sensitivity and specificity. Bones: No acute fracture. No dislocation. No femoral head avascular necrosis. Bony pelvis intact without diastasis. Mild arthropathy of the bilateral sacroiliac joints. No SI joint effusion or erosion. Pubic symphysis within normal limits. Partially visualized lower lumbar spondylosis. No bone marrow edema. No marrow replacing  bone lesion. Articular cartilage and labrum Articular cartilage: Mild chondral thinning without focal defect. No subchondral marrow signal changes. Labrum: Suboptimally evaluated on motion degraded, non arthrographic images. No paralabral cyst. Joint or bursal effusion Joint effusion:  None. Bursae: Small volume bilateral peritrochanteric bursal fluid collections. Muscles and tendons Muscles and tendons: The gluteal, hamstring, iliopsoas, rectus femoris, and adductor tendons appear intact without tear or significant tendinosis. Normal muscle bulk and signal intensity without edema, atrophy, or  fatty infiltration. Other findings Miscellaneous: No soft tissue mass lesion identified. No soft tissue edema or fluid collection. No inguinal lymphadenopathy. No acute findings are evident within the pelvis. IMPRESSION: 1. No evidence of marrow replacing bone lesion. 2. Mild left hip osteoarthritis. 3. Mild bilateral peritrochanteric bursal fluid collections. 4. Mild degenerative changes of the bilateral sacroiliac joints. Electronically Signed   By: Davina Poke D.O.   On: 09/21/2021 13:24     ASSESSMENT:  1.  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  Basis for CKD is hypertension associated arteriosclerosis with arterionephrosclerosis. - Bone marrow biopsy on 02/21/2021 with slightly hypercellular bone marrow with trilineage hematopoiesis.  Plasma cells increased in number representing 11% of all cells with kappa light chain restriction. - Myeloma FISH panel with no evidence of T p53.  Plasma cell enrichment yielded limited cellularity, therefore only tested for T p53 probes at. - Chromosome analysis 45, X,- Y (8)/46, XY (12) - Labs on 02/01/2021-M spike 2 g, kappa light chain 794, free light chain ratio 49.97.  Beta-2 microglobulin 5.5, albumin 3.5.  LDH normal. - PET scan on 03/08/2021 with no evidence of active myeloma.  No soft tissue plasmacytoma. - 24-hour urine total protein 2.2 g on 03/13/2021.  Immunofixation positive for IgG kappa. - 4 cycles of CyBorD from 03/14/2021 through 07/04/2021 held due to fluid retention.  Myeloma labs on 08/08/2021 showed progression. - Daratumumab, Velcade and dexamethasone started on 08/30/2021.   2.  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  Smoked 2 packs/day for 32 years and quit smoking cigarettes and vaping now. -Mother had small cell lung cancer.   3.  CKD: -He has stage V CKD  thought to be secondary to hypertension. -Renal ultrasound on 12/19/2020 was negative for obstructive uropathy and findings were consistent with chronic medical renal disease.  Bilateral kidney cysts were seen. -He has 2.7 g of proteinuria on 24-hour urine. -Kidney biopsy was done on 01/25/2021.   PLAN:  1.  Stage II, standard risk IgG kappa multiple myeloma: - Daratumumab, Velcade and dexamethasone were started on 08/30/2021. - Dexamethasone is 10 mg weekly. - I reviewed his myeloma labs from 09/20/2021.  M spike improved to 0.8 g from 1.2 g after 1 cycle.  Light chain ratio also improved to 8 from 20. - His labs today shows normal white count and platelet count.  LFTs are normal.  Overall his fluid in the legs also improved. - We will continue same treatment at this time.  He did not report any major side effects. - Starting next week, he will take premeds including Tylenol Benadryl and dexamethasone 10 mg at home.  I have sent a prescription for dexamethasone to his pharmacy.  RTC 4 weeks for follow-up.  I plan to repeat myeloma panel 1 week prior.   2.  Smoking history: - CT chest on 02/15/2021 was lung RADS category 1.  3.  ID prophylaxis: - Continue aspirin for thromboprophylaxis.  Continue acyclovir twice daily.   4.  Myeloma bone disease: - Last denosumab on 09/13/2021.  Calcium is 8.5.  5.  Lower extremity swelling: - Continue torsemide 100 mg twice daily.  Edema has improved.  6.  Peripheral neuropathy: - Continue gabapentin daily which is helping.  7.  Low back pain: -Continue oxycodone along with Tylenol as needed.  8.  ESRD on HD: - HD started on 08/22/2021, on Monday Wednesday and Friday.  His weight has improved by 8 pounds in the last 6 weeks.   Orders placed this encounter:  No orders of the defined types were placed in this encounter.     , MD [facility] [phone]   I, Kirstyn Evans, am acting as a scribe for Dr.   .  I,   MD, have reviewed the above documentation for accuracy and completeness, and I agree with the above.     

## 2021-10-04 ENCOUNTER — Inpatient Hospital Stay (HOSPITAL_COMMUNITY): Payer: Medicare HMO

## 2021-10-04 ENCOUNTER — Other Ambulatory Visit (HOSPITAL_COMMUNITY): Payer: Self-pay | Admitting: *Deleted

## 2021-10-04 ENCOUNTER — Other Ambulatory Visit: Payer: Self-pay

## 2021-10-04 ENCOUNTER — Inpatient Hospital Stay (HOSPITAL_COMMUNITY): Payer: Medicare HMO | Attending: Hematology | Admitting: Hematology

## 2021-10-04 ENCOUNTER — Encounter (HOSPITAL_COMMUNITY): Payer: Self-pay | Admitting: Hematology

## 2021-10-04 VITALS — BP 116/89 | HR 101 | Temp 98.8°F | Resp 20 | Wt 250.4 lb

## 2021-10-04 DIAGNOSIS — D472 Monoclonal gammopathy: Secondary | ICD-10-CM | POA: Diagnosis not present

## 2021-10-04 DIAGNOSIS — C9000 Multiple myeloma not having achieved remission: Secondary | ICD-10-CM

## 2021-10-04 DIAGNOSIS — Z5112 Encounter for antineoplastic immunotherapy: Secondary | ICD-10-CM | POA: Insufficient documentation

## 2021-10-04 LAB — COMPREHENSIVE METABOLIC PANEL
ALT: 33 U/L (ref 0–44)
AST: 22 U/L (ref 15–41)
Albumin: 3.8 g/dL (ref 3.5–5.0)
Alkaline Phosphatase: 32 U/L — ABNORMAL LOW (ref 38–126)
Anion gap: 10 (ref 5–15)
BUN: 21 mg/dL (ref 8–23)
CO2: 25 mmol/L (ref 22–32)
Calcium: 8.5 mg/dL — ABNORMAL LOW (ref 8.9–10.3)
Chloride: 98 mmol/L (ref 98–111)
Creatinine, Ser: 3.43 mg/dL — ABNORMAL HIGH (ref 0.61–1.24)
GFR, Estimated: 19 mL/min — ABNORMAL LOW (ref >60)
Glucose, Bld: 102 mg/dL — ABNORMAL HIGH (ref 70–99)
Potassium: 3.5 mmol/L (ref 3.5–5.1)
Sodium: 133 mmol/L — ABNORMAL LOW (ref 135–145)
Total Bilirubin: 0.3 mg/dL (ref 0.3–1.2)
Total Protein: 7.2 g/dL (ref 6.5–8.1)

## 2021-10-04 LAB — CBC WITH DIFFERENTIAL/PLATELET
Abs Immature Granulocytes: 0.02 10*3/uL (ref 0.00–0.07)
Basophils Absolute: 0.1 10*3/uL (ref 0.0–0.1)
Basophils Relative: 1 %
Eosinophils Absolute: 0.1 10*3/uL (ref 0.0–0.5)
Eosinophils Relative: 2 %
HCT: 34.4 % — ABNORMAL LOW (ref 39.0–52.0)
Hemoglobin: 11.6 g/dL — ABNORMAL LOW (ref 13.0–17.0)
Immature Granulocytes: 0 %
Lymphocytes Relative: 25 %
Lymphs Abs: 1.6 10*3/uL (ref 0.7–4.0)
MCH: 32.8 pg (ref 26.0–34.0)
MCHC: 33.7 g/dL (ref 30.0–36.0)
MCV: 97.2 fL (ref 80.0–100.0)
Monocytes Absolute: 1.2 10*3/uL — ABNORMAL HIGH (ref 0.1–1.0)
Monocytes Relative: 19 %
Neutro Abs: 3.5 10*3/uL (ref 1.7–7.7)
Neutrophils Relative %: 53 %
Platelets: 213 10*3/uL (ref 150–400)
RBC: 3.54 MIL/uL — ABNORMAL LOW (ref 4.22–5.81)
RDW: 13.2 % (ref 11.5–15.5)
WBC: 6.6 10*3/uL (ref 4.0–10.5)
nRBC: 0 % (ref 0.0–0.2)

## 2021-10-04 LAB — MAGNESIUM: Magnesium: 2 mg/dL (ref 1.7–2.4)

## 2021-10-04 LAB — LACTATE DEHYDROGENASE: LDH: 234 U/L — ABNORMAL HIGH (ref 98–192)

## 2021-10-04 MED ORDER — DARATUMUMAB-HYALURONIDASE-FIHJ 1800-30000 MG-UT/15ML ~~LOC~~ SOLN
1800.0000 mg | Freq: Once | SUBCUTANEOUS | Status: AC
  Administered 2021-10-04: 1800 mg via SUBCUTANEOUS
  Filled 2021-10-04: qty 15

## 2021-10-04 MED ORDER — DIPHENHYDRAMINE HCL 25 MG PO CAPS
50.0000 mg | ORAL_CAPSULE | Freq: Once | ORAL | Status: AC
  Administered 2021-10-04: 50 mg via ORAL
  Filled 2021-10-04: qty 2

## 2021-10-04 MED ORDER — ACETAMINOPHEN 325 MG PO TABS
650.0000 mg | ORAL_TABLET | Freq: Once | ORAL | Status: AC
  Administered 2021-10-04: 650 mg via ORAL
  Filled 2021-10-04: qty 2

## 2021-10-04 MED ORDER — DEXAMETHASONE 4 MG PO TABS: 10.0000 mg | ORAL_TABLET | ORAL | 2 refills | Status: DC

## 2021-10-04 MED ORDER — DEXAMETHASONE 4 MG PO TABS
10.0000 mg | ORAL_TABLET | Freq: Once | ORAL | Status: AC
  Administered 2021-10-04: 10 mg via ORAL
  Filled 2021-10-04: qty 3

## 2021-10-04 MED ORDER — BORTEZOMIB CHEMO SQ INJECTION 3.5 MG (2.5MG/ML)
1.0000 mg/m2 | Freq: Once | INTRAMUSCULAR | Status: AC
  Administered 2021-10-04: 2.25 mg via SUBCUTANEOUS
  Filled 2021-10-04: qty 0.9

## 2021-10-04 NOTE — Progress Notes (Signed)
Pt presents today for Velcade injection and Dara SQ per provider's order. Vital signs and other labs WNL for treatment. Ser creatinine 3.43 okay to proceed with treatment per Dr.K. Pt voiced no new complaints at this time.  Velcade and Dara SQ injection given today per MD orders. Tolerated infusion without adverse affects. Vital signs stable. No complaints at this time. Discharged from clinic ambulatory in stable condition. Alert and oriented x 3. F/U with [facility] as scheduled. Pt declined AVS at this time.

## 2021-10-04 NOTE — Patient Instructions (Addendum)
[place] at [facility] Discharge Instructions   You were seen and examined today by Dr. Delton Coombes. He reviewed your myeloma labs, which have improved. We will proceed with your treatment today. Return as scheduled for labs, office visit, and treatment.    Thank you for choosing [place] at [facility] to provide your oncology and hematology care.  To afford each patient quality time with our provider, please arrive at least 15 minutes before your scheduled appointment time.   If you have a lab appointment with the [facility] please come in thru the Main Entrance and check in at the main information desk.  You need to re-schedule your appointment should you arrive 10 or more minutes late.  We strive to give you quality time with our providers, and arriving late affects you and other patients whose appointments are after yours.  Also, if you no show three or more times for appointments you may be dismissed from the clinic at the providers discretion.     Again, thank you for choosing [facility].  Our hope is that these requests will decrease the amount of time that you wait before being seen by our physicians.       _____________________________________________________________  Should you have questions after your visit to [facility], please contact our office at [phone] and follow the prompts.  Our office hours are 8:00 a.m. and 4:30 p.m. Monday - Friday.  Please note that voicemails left after 4:00 p.m. may not be returned until the following business day.  We are closed weekends and major holidays.  You do have access to a nurse 24-7, just call the main number to the clinic [phone] and do not press any options, hold on the line and a nurse will answer the phone.    For prescription refill requests, have your pharmacy contact our office and allow 72 hours.    Due to Covid, you will need to wear a mask  upon entering the hospital. If you do not have a mask, a mask will be given to you at the Main Entrance upon arrival. For doctor visits, patients may have 1 support person age 37 or older with them. For treatment visits, patients can not have anyone with them due to social distancing guidelines and our immunocompromised population.

## 2021-10-05 DIAGNOSIS — N186 End stage renal disease: Secondary | ICD-10-CM | POA: Diagnosis not present

## 2021-10-05 DIAGNOSIS — Z992 Dependence on renal dialysis: Secondary | ICD-10-CM | POA: Diagnosis not present

## 2021-10-05 LAB — KAPPA/LAMBDA LIGHT CHAINS
Kappa free light chain: 58.1 mg/L — ABNORMAL HIGH (ref 3.3–19.4)
Kappa, lambda light chain ratio: 6.32 — ABNORMAL HIGH (ref 0.26–1.65)
Lambda free light chains: 9.2 mg/L (ref 5.7–26.3)

## 2021-10-08 ENCOUNTER — Other Ambulatory Visit (HOSPITAL_COMMUNITY): Payer: Medicare HMO

## 2021-10-08 ENCOUNTER — Ambulatory Visit (HOSPITAL_COMMUNITY): Payer: Medicare HMO

## 2021-10-08 DIAGNOSIS — Z992 Dependence on renal dialysis: Secondary | ICD-10-CM | POA: Diagnosis not present

## 2021-10-08 DIAGNOSIS — N186 End stage renal disease: Secondary | ICD-10-CM | POA: Diagnosis not present

## 2021-10-09 ENCOUNTER — Ambulatory Visit (HOSPITAL_COMMUNITY): Payer: Medicare HMO

## 2021-10-09 ENCOUNTER — Other Ambulatory Visit (HOSPITAL_COMMUNITY): Payer: Medicare HMO

## 2021-10-09 LAB — PROTEIN ELECTROPHORESIS, SERUM
A/G Ratio: 1.2 (ref 0.7–1.7)
Albumin ELP: 3.8 g/dL (ref 2.9–4.4)
Alpha-1-Globulin: 0.3 g/dL (ref 0.0–0.4)
Alpha-2-Globulin: 1 g/dL (ref 0.4–1.0)
Beta Globulin: 1.7 g/dL — ABNORMAL HIGH (ref 0.7–1.3)
Gamma Globulin: 0.2 g/dL — ABNORMAL LOW (ref 0.4–1.8)
Globulin, Total: 3.2 g/dL (ref 2.2–3.9)
M-Spike, %: 0.7 g/dL — ABNORMAL HIGH
Total Protein ELP: 7 g/dL (ref 6.0–8.5)

## 2021-10-09 LAB — IMMUNOFIXATION ELECTROPHORESIS
IgA: 22 mg/dL — ABNORMAL LOW (ref 61–437)
IgG (Immunoglobin G), Serum: 1150 mg/dL (ref 603–1613)
IgM (Immunoglobulin M), Srm: 8 mg/dL — ABNORMAL LOW (ref 20–172)
Total Protein ELP: 7 g/dL (ref 6.0–8.5)

## 2021-10-10 DIAGNOSIS — Z992 Dependence on renal dialysis: Secondary | ICD-10-CM | POA: Diagnosis not present

## 2021-10-10 DIAGNOSIS — N186 End stage renal disease: Secondary | ICD-10-CM | POA: Diagnosis not present

## 2021-10-11 ENCOUNTER — Inpatient Hospital Stay (HOSPITAL_COMMUNITY): Payer: Medicare HMO

## 2021-10-11 ENCOUNTER — Other Ambulatory Visit: Payer: Self-pay

## 2021-10-11 VITALS — BP 103/76 | HR 111 | Temp 98.7°F | Resp 19 | Wt 247.8 lb

## 2021-10-11 DIAGNOSIS — C9000 Multiple myeloma not having achieved remission: Secondary | ICD-10-CM

## 2021-10-11 DIAGNOSIS — Z5112 Encounter for antineoplastic immunotherapy: Secondary | ICD-10-CM | POA: Diagnosis not present

## 2021-10-11 LAB — COMPREHENSIVE METABOLIC PANEL
ALT: 36 U/L (ref 0–44)
AST: 22 U/L (ref 15–41)
Albumin: 3.6 g/dL (ref 3.5–5.0)
Alkaline Phosphatase: 32 U/L — ABNORMAL LOW (ref 38–126)
Anion gap: 10 (ref 5–15)
BUN: 36 mg/dL — ABNORMAL HIGH (ref 8–23)
CO2: 24 mmol/L (ref 22–32)
Calcium: 8.9 mg/dL (ref 8.9–10.3)
Chloride: 101 mmol/L (ref 98–111)
Creatinine, Ser: 4.04 mg/dL — ABNORMAL HIGH (ref 0.61–1.24)
GFR, Estimated: 16 mL/min — ABNORMAL LOW (ref >60)
Glucose, Bld: 126 mg/dL — ABNORMAL HIGH (ref 70–99)
Potassium: 3.5 mmol/L (ref 3.5–5.1)
Sodium: 135 mmol/L (ref 135–145)
Total Bilirubin: 0.4 mg/dL (ref 0.3–1.2)
Total Protein: 6.7 g/dL (ref 6.5–8.1)

## 2021-10-11 LAB — CBC WITH DIFFERENTIAL/PLATELET
Abs Immature Granulocytes: 0.03 10*3/uL (ref 0.00–0.07)
Basophils Absolute: 0.1 10*3/uL (ref 0.0–0.1)
Basophils Relative: 1 %
Eosinophils Absolute: 0.1 10*3/uL (ref 0.0–0.5)
Eosinophils Relative: 2 %
HCT: 33.2 % — ABNORMAL LOW (ref 39.0–52.0)
Hemoglobin: 11.2 g/dL — ABNORMAL LOW (ref 13.0–17.0)
Immature Granulocytes: 0 %
Lymphocytes Relative: 35 %
Lymphs Abs: 2.6 10*3/uL (ref 0.7–4.0)
MCH: 32.7 pg (ref 26.0–34.0)
MCHC: 33.7 g/dL (ref 30.0–36.0)
MCV: 97.1 fL (ref 80.0–100.0)
Monocytes Absolute: 1.3 10*3/uL — ABNORMAL HIGH (ref 0.1–1.0)
Monocytes Relative: 17 %
Neutro Abs: 3.4 10*3/uL (ref 1.7–7.7)
Neutrophils Relative %: 45 %
Platelets: 184 10*3/uL (ref 150–400)
RBC: 3.42 MIL/uL — ABNORMAL LOW (ref 4.22–5.81)
RDW: 13.6 % (ref 11.5–15.5)
WBC: 7.4 10*3/uL (ref 4.0–10.5)
nRBC: 0.3 % — ABNORMAL HIGH (ref 0.0–0.2)

## 2021-10-11 LAB — MAGNESIUM: Magnesium: 2 mg/dL (ref 1.7–2.4)

## 2021-10-11 MED ORDER — BORTEZOMIB CHEMO SQ INJECTION 3.5 MG (2.5MG/ML)
1.0000 mg/m2 | Freq: Once | INTRAMUSCULAR | Status: AC
  Administered 2021-10-11: 2.25 mg via SUBCUTANEOUS
  Filled 2021-10-11: qty 0.9

## 2021-10-11 MED ORDER — DARATUMUMAB-HYALURONIDASE-FIHJ 1800-30000 MG-UT/15ML ~~LOC~~ SOLN
1800.0000 mg | Freq: Once | SUBCUTANEOUS | Status: AC
  Administered 2021-10-11: 1800 mg via SUBCUTANEOUS
  Filled 2021-10-11: qty 15

## 2021-10-11 NOTE — Progress Notes (Signed)
Patient presents today for Velcade and Daratumumab injections per providers order.  Labs and vital signs reviewed.  Creatinine 4.04, patient is a dialysis patient, Dr. Delton Coombes notified.  Patient took premedications (Tylenol,Benadryl,Dexamethasone) at home.  Velcade and Daratumumab injections given today per MD orders.  Stable during infusion without adverse affects.  Vital signs stable.  No complaints at this time.  Discharge from clinic ambulatory in stable condition.  Alert and oriented X 3.  Patient declined AVS.  Follow up with [facility] as scheduled.

## 2021-10-11 NOTE — Patient Instructions (Signed)
Ernest Barnett  Discharge Instructions: Thank you for choosing [facility] to provide your oncology and hematology care.  If you have a lab appointment with the [facility], please come in thru the Main Entrance and check in at the main information desk.  Wear comfortable clothing and clothing appropriate for easy access to any Portacath or PICC line.   We strive to give you quality time with your provider. You may need to reschedule your appointment if you arrive late (15 or more minutes).  Arriving late affects you and other patients whose appointments are after yours.  Also, if you miss three or more appointments without notifying the office, you may be dismissed from the clinic at the provider's discretion.      For prescription refill requests, have your pharmacy contact our office and allow 72 hours for refills to be completed.    Today you received the following chemotherapy and/or immunotherapy agents Velcade, Daratumumab      To help prevent nausea and vomiting after your treatment, we encourage you to take your nausea medication as directed.  BELOW ARE SYMPTOMS THAT SHOULD BE REPORTED IMMEDIATELY: *FEVER GREATER THAN 100.4 F (38 C) OR HIGHER *CHILLS OR SWEATING *NAUSEA AND VOMITING THAT IS NOT CONTROLLED WITH YOUR NAUSEA MEDICATION *UNUSUAL SHORTNESS OF BREATH *UNUSUAL BRUISING OR BLEEDING *URINARY PROBLEMS (pain or burning when urinating, or frequent urination) *BOWEL PROBLEMS (unusual diarrhea, constipation, pain near the anus) TENDERNESS IN MOUTH AND THROAT WITH OR WITHOUT PRESENCE OF ULCERS (sore throat, sores in mouth, or a toothache) UNUSUAL RASH, SWELLING OR PAIN  UNUSUAL VAGINAL DISCHARGE OR ITCHING   Items with * indicate a potential emergency and should be followed up as soon as possible or go to the Emergency Department if any problems should occur.  Please show the CHEMOTHERAPY ALERT CARD or IMMUNOTHERAPY ALERT CARD at check-in to the  Emergency Department and triage nurse.  Should you have questions after your visit or need to cancel or reschedule your appointment, please contact [facility] [phone]  and follow the prompts.  Office hours are 8:00 a.m. to 4:30 p.m. Monday - Friday. Please note that voicemails left after 4:00 p.m. may not be returned until the following business day.  We are closed weekends and major holidays. You have access to a nurse at all times for urgent questions. Please call the main number to the clinic [phone] and follow the prompts.  For any non-urgent questions, you may also contact your provider using MyChart. We now offer e-Visits for anyone 50 and older to request care online for non-urgent symptoms. For details visit [email or web address].   Also download the MyChart app! Go to the app store, search "MyChart", open the app, select [facility], and log in with your MyChart username and password.  Due to Covid, a mask is required upon entering the hospital/clinic. If you do not have a mask, one will be given to you upon arrival. For doctor visits, patients may have 1 support person aged 68 or older with them. For treatment visits, patients cannot have anyone with them due to current Covid guidelines and our immunocompromised population.

## 2021-10-12 DIAGNOSIS — Z992 Dependence on renal dialysis: Secondary | ICD-10-CM | POA: Diagnosis not present

## 2021-10-12 DIAGNOSIS — N186 End stage renal disease: Secondary | ICD-10-CM | POA: Diagnosis not present

## 2021-10-15 ENCOUNTER — Other Ambulatory Visit (HOSPITAL_COMMUNITY): Payer: Medicare HMO

## 2021-10-15 ENCOUNTER — Ambulatory Visit (HOSPITAL_COMMUNITY): Payer: Medicare HMO

## 2021-10-15 DIAGNOSIS — Z992 Dependence on renal dialysis: Secondary | ICD-10-CM | POA: Diagnosis not present

## 2021-10-15 DIAGNOSIS — N186 End stage renal disease: Secondary | ICD-10-CM | POA: Diagnosis not present

## 2021-10-16 ENCOUNTER — Other Ambulatory Visit: Payer: Self-pay

## 2021-10-16 DIAGNOSIS — R0602 Shortness of breath: Secondary | ICD-10-CM

## 2021-10-17 ENCOUNTER — Other Ambulatory Visit: Payer: Self-pay

## 2021-10-17 ENCOUNTER — Encounter: Payer: Self-pay | Admitting: Vascular Surgery

## 2021-10-17 ENCOUNTER — Ambulatory Visit (INDEPENDENT_AMBULATORY_CARE_PROVIDER_SITE_OTHER): Payer: Medicare HMO | Admitting: Vascular Surgery

## 2021-10-17 VITALS — BP 94/63 | HR 103 | Temp 98.4°F | Resp 20 | Ht 67.0 in | Wt 252.4 lb

## 2021-10-17 DIAGNOSIS — N186 End stage renal disease: Secondary | ICD-10-CM

## 2021-10-17 DIAGNOSIS — Z992 Dependence on renal dialysis: Secondary | ICD-10-CM | POA: Diagnosis not present

## 2021-10-17 NOTE — Progress Notes (Signed)
[facility]  Patient name: Ernest Barnett MRN: [id number] DOB: Jul 05, 1956 Sex: male  REASON FOR VISIT: Follow-up ligation competing branches of right radiocephalic AV fistula on 09/11/2021.  HPI: Ernest Barnett is a 65 y.o. male here today for follow-up.  He currently is dialysis via left IJ catheter.  He had initial placement of a right radiocephalic fistula with moderate Delvin Hedeen maturation.  He did have a large competing branch and was felt that his cephalic vein was inadequate for fistula use in his forearm.  He was taken to the operating room on 09/11/2021 at [facility] had ligation of competing branches and is here today for follow-up.  He has no steal symptoms.  Current Outpatient Medications  Medication Sig Dispense Refill   albuterol (VENTOLIN HFA) 108 (90 Base) MCG/ACT inhaler Inhale 2 puffs into the lungs every 6 (six) hours as needed for wheezing or shortness of breath. 8 g 0   aspirin EC 81 MG tablet Take 1 tablet (81 mg total) by mouth daily. 30 tablet 11   bortezomib IV (VELCADE) 3.5 MG injection Inject 1.5 mg/m2 into the vein once a week.     budesonide-formoterol (SYMBICORT) 80-4.5 MCG/ACT inhaler Inhale 2 puffs into the lungs 2 (two) times daily as needed (shortness of breath).     calcitRIOL (ROCALTROL) 0.25 MCG capsule Take 0.25 mcg by mouth daily.     cholecalciferol (VITAMIN D3) 25 MCG (1000 UNIT) tablet Take 2,000 Units by mouth daily.     CYCLOPHOSPHAMIDE IV Inject 300 mg/m2 into the vein once a week.     dexamethasone (DECADRON) 4 MG tablet Take 2.5 tablets (10 mg total) by mouth once a week. Take at home along with Tylenol and Benadryl weekly on the day of daratumumab injection. 30 tablet 2   dexamethasone 40 mg in sodium chloride 0.9 % 50 mL Inject 40 mg into the vein once a week.     docusate sodium (COLACE) 100 MG capsule Take 100 mg by mouth daily.     gabapentin (NEURONTIN) 300 MG capsule Take 1  capsule (300 mg total) by mouth 2 (two) times daily. 60 capsule 1   midodrine (PROAMATINE) 10 MG tablet Take by mouth.     Omega-3 Fatty Acids (FISH OIL) 1000 MG CAPS Take 1,000 mg by mouth in the morning, at noon, and at bedtime.      oxyCODONE (OXY IR/ROXICODONE) 5 MG immediate release tablet Take 1 tablet (5 mg total) by mouth 2 (two) times daily as needed for severe pain. 30 tablet 0   pantoprazole (PROTONIX) 40 MG tablet Take 1 tablet (40 mg total) by mouth 2 (two) times daily. 180 tablet 3   polyethylene glycol (MIRALAX / GLYCOLAX) packet Take 17 g by mouth daily. Patient states that he takes as needed. 30 each 5   pravastatin (PRAVACHOL) 80 MG tablet Take 1 tablet (80 mg total) by mouth daily. 90 tablet 3   sodium bicarbonate 325 MG tablet Take 325 mg by mouth 3 (three) times daily.     tamsulosin (FLOMAX) 0.4 MG CAPS capsule Take 0.4 mg by mouth daily.      tiZANidine (ZANAFLEX) 2 MG tablet Take 1 tablet (2 mg total) by mouth every 6 (six) hours as needed for muscle spasms. 30 tablet 1   torsemide (DEMADEX) 100 MG tablet Take 100 mg by mouth 2 (two) times daily.     No current facility-administered medications for this visit.   Facility-Administered Medications Ordered  in Other Visits  Medication Dose Route Frequency Provider Last Rate Last Admin   palonosetron (ALOXI) 0.25 MG/5ML injection              PHYSICAL EXAM: Vitals:   10/17/21 1028  BP: 94/63  Pulse: (!) 103  Resp: 20  Temp: 98.4 F (36.9 C)  TempSrc: Oral  SpO2: 95%  Weight: 252 lb 6.4 oz (114.5 kg)  Height: 5\' 7"  (1.702 m)    GENERAL: The patient is a well-nourished male, in no acute distress. The vital signs are documented above. He does have straight course of his cephalic vein fistula from the wrist to the upper third of his forearm.  The size does not appear to be approximately 6 mm in diameter.  He has a good thrill.  MEDICAL ISSUES: Had long discussion with the patient and his family present.  I feel  that it is appropriate to attempt use of his fistula.  I did image his fistula with SonoSite ultrasound and he does have moderate size.  This does extend to the antecubital vein and then in the upper arm cephalic vein.  He has a small upper arm basilic vein.  Hopefully he will have adequate size for fistula use.  He understands that this may not be adequate.  His next fistula would be a upper arm brachiocephalic fistula.  He is quite anxious to get his catheter out.  I explained that we would certainly want to have several successful sessions before removal of the catheter.  We can remove the catheter at [facility] procedural room.   Larina Earthly, MD FACS [facility] [phone]  Note: Portions of this report may have been transcribed using voice recognition software.  Every effort has been made to ensure accuracy; however, inadvertent computerized transcription errors may still be present.

## 2021-10-18 ENCOUNTER — Ambulatory Visit (HOSPITAL_COMMUNITY): Payer: Medicare HMO | Admitting: Hematology

## 2021-10-18 ENCOUNTER — Inpatient Hospital Stay (HOSPITAL_COMMUNITY): Payer: Medicare HMO

## 2021-10-18 ENCOUNTER — Other Ambulatory Visit (HOSPITAL_COMMUNITY): Payer: Medicare HMO

## 2021-10-18 ENCOUNTER — Encounter (HOSPITAL_COMMUNITY): Payer: Self-pay

## 2021-10-18 VITALS — BP 98/69 | HR 98 | Temp 98.0°F | Resp 18 | Ht 66.0 in | Wt 251.2 lb

## 2021-10-18 DIAGNOSIS — C9000 Multiple myeloma not having achieved remission: Secondary | ICD-10-CM | POA: Diagnosis not present

## 2021-10-18 DIAGNOSIS — Z5112 Encounter for antineoplastic immunotherapy: Secondary | ICD-10-CM | POA: Diagnosis not present

## 2021-10-18 LAB — CBC WITH DIFFERENTIAL/PLATELET
Abs Immature Granulocytes: 0.01 10*3/uL (ref 0.00–0.07)
Basophils Absolute: 0.1 10*3/uL (ref 0.0–0.1)
Basophils Relative: 1 %
Eosinophils Absolute: 0.1 10*3/uL (ref 0.0–0.5)
Eosinophils Relative: 1 %
HCT: 33.2 % — ABNORMAL LOW (ref 39.0–52.0)
Hemoglobin: 11.1 g/dL — ABNORMAL LOW (ref 13.0–17.0)
Immature Granulocytes: 0 %
Lymphocytes Relative: 37 %
Lymphs Abs: 2.9 10*3/uL (ref 0.7–4.0)
MCH: 32.9 pg (ref 26.0–34.0)
MCHC: 33.4 g/dL (ref 30.0–36.0)
MCV: 98.5 fL (ref 80.0–100.0)
Monocytes Absolute: 1.2 10*3/uL — ABNORMAL HIGH (ref 0.1–1.0)
Monocytes Relative: 16 %
Neutro Abs: 3.5 10*3/uL (ref 1.7–7.7)
Neutrophils Relative %: 45 %
Platelets: 195 10*3/uL (ref 150–400)
RBC: 3.37 MIL/uL — ABNORMAL LOW (ref 4.22–5.81)
RDW: 14.1 % (ref 11.5–15.5)
WBC: 7.7 10*3/uL (ref 4.0–10.5)
nRBC: 0 % (ref 0.0–0.2)

## 2021-10-18 LAB — COMPREHENSIVE METABOLIC PANEL
ALT: 31 U/L (ref 0–44)
AST: 22 U/L (ref 15–41)
Albumin: 3.5 g/dL (ref 3.5–5.0)
Alkaline Phosphatase: 28 U/L — ABNORMAL LOW (ref 38–126)
Anion gap: 10 (ref 5–15)
BUN: 19 mg/dL (ref 8–23)
CO2: 26 mmol/L (ref 22–32)
Calcium: 8.2 mg/dL — ABNORMAL LOW (ref 8.9–10.3)
Chloride: 99 mmol/L (ref 98–111)
Creatinine, Ser: 3.11 mg/dL — ABNORMAL HIGH (ref 0.61–1.24)
GFR, Estimated: 22 mL/min — ABNORMAL LOW (ref >60)
Glucose, Bld: 107 mg/dL — ABNORMAL HIGH (ref 70–99)
Potassium: 3.7 mmol/L (ref 3.5–5.1)
Sodium: 135 mmol/L (ref 135–145)
Total Bilirubin: 0.6 mg/dL (ref 0.3–1.2)
Total Protein: 6.8 g/dL (ref 6.5–8.1)

## 2021-10-18 LAB — MAGNESIUM: Magnesium: 1.9 mg/dL (ref 1.7–2.4)

## 2021-10-18 LAB — LACTATE DEHYDROGENASE: LDH: 247 U/L — ABNORMAL HIGH (ref 98–192)

## 2021-10-18 MED ORDER — BORTEZOMIB CHEMO SQ INJECTION 3.5 MG (2.5MG/ML)
1.0000 mg/m2 | Freq: Once | INTRAMUSCULAR | Status: AC
  Administered 2021-10-18: 2.25 mg via SUBCUTANEOUS
  Filled 2021-10-18: qty 0.9

## 2021-10-18 MED ORDER — DARATUMUMAB-HYALURONIDASE-FIHJ 1800-30000 MG-UT/15ML ~~LOC~~ SOLN
1800.0000 mg | Freq: Once | SUBCUTANEOUS | Status: AC
  Administered 2021-10-18: 1800 mg via SUBCUTANEOUS
  Filled 2021-10-18: qty 15

## 2021-10-18 NOTE — Patient Instructions (Signed)
[facility]  Discharge Instructions: Thank you for choosing [facility] to provide your oncology and hematology care.  If you have a lab appointment with the [facility], please come in thru the Main Entrance and check in at the main information desk.  Wear comfortable clothing and clothing appropriate for easy access to any Portacath or PICC line.   We strive to give you quality time with your provider. You may need to reschedule your appointment if you arrive late (15 or more minutes).  Arriving late affects you and other patients whose appointments are after yours.  Also, if you miss three or more appointments without notifying the office, you may be dismissed from the clinic at the providers discretion.      For prescription refill requests, have your pharmacy contact our office and allow 72 hours for refills to be completed.    Today you received the following chemotherapy Darzalez Faspro and Velcade injection.       To help prevent nausea and vomiting after your treatment, we encourage you to take your nausea medication as directed.  BELOW ARE SYMPTOMS THAT SHOULD BE REPORTED IMMEDIATELY: *FEVER GREATER THAN 100.4 F (38 C) OR HIGHER *CHILLS OR SWEATING *NAUSEA AND VOMITING THAT IS NOT CONTROLLED WITH YOUR NAUSEA MEDICATION *UNUSUAL SHORTNESS OF BREATH *UNUSUAL BRUISING OR BLEEDING *URINARY PROBLEMS (pain or burning when urinating, or frequent urination) *BOWEL PROBLEMS (unusual diarrhea, constipation, pain near the anus) TENDERNESS IN MOUTH AND THROAT WITH OR WITHOUT PRESENCE OF ULCERS (sore throat, sores in mouth, or a toothache) UNUSUAL RASH, SWELLING OR PAIN  UNUSUAL VAGINAL DISCHARGE OR ITCHING   Items with * indicate a potential emergency and should be followed up as soon as possible or go to the Emergency Department if any problems should occur.  Please show the CHEMOTHERAPY ALERT CARD or IMMUNOTHERAPY ALERT CARD at check-in to the Emergency  Department and triage nurse.  Should you have questions after your visit or need to cancel or reschedule your appointment, please contact [facility] [phone]  and follow the prompts.  Office hours are 8:00 a.m. to 4:30 p.m. Monday - Friday. Please note that voicemails left after 4:00 p.m. may not be returned until the following business day.  We are closed weekends and major holidays. You have access to a nurse at all times for urgent questions. Please call the main number to the clinic [phone] and follow the prompts.  For any non-urgent questions, you may also contact your provider using MyChart. We now offer e-Visits for anyone 64 and older to request care online for non-urgent symptoms. For details visit [email or web address].   Also download the MyChart app! Go to the app store, search "MyChart", open the app, select [facility], and log in with your MyChart username and password.  Due to Covid, a mask is required upon entering the hospital/clinic. If you do not have a mask, one will be given to you upon arrival. For doctor visits, patients may have 1 support person aged 64 or older with them. For treatment visits, patients cannot have anyone with them due to current Covid guidelines and our immunocompromised population.

## 2021-10-18 NOTE — Progress Notes (Signed)
Patient presents today for treatment. No complaints of any changes since his last visit. Patient denies any side effects of his last treatment. Patient took pre-medications prior to arrival at 09:55 am per patient's words. (Tylenol 650 mgs po, Benadryl 50 mgs po and decadron 10 mg po). Labs pending.    Reported creatinine of 3.3 to Dr. Delton Coombes. Message received to proceed with treatment and note added to treatment plan to ignore creatinine under treatment conditions.   Treatment given today per MD orders. Tolerated without adverse affects. Vital signs stable. No complaints at this time. Discharged from clinic ambulatory in stable condition. Alert and oriented x 3. F/U with [facility] as scheduled.

## 2021-10-19 ENCOUNTER — Inpatient Hospital Stay (HOSPITAL_COMMUNITY): Admission: RE | Admit: 2021-10-19 | Payer: Medicare HMO | Source: Ambulatory Visit

## 2021-10-19 DIAGNOSIS — Z992 Dependence on renal dialysis: Secondary | ICD-10-CM | POA: Diagnosis not present

## 2021-10-19 DIAGNOSIS — N186 End stage renal disease: Secondary | ICD-10-CM | POA: Diagnosis not present

## 2021-10-19 LAB — KAPPA/LAMBDA LIGHT CHAINS
Kappa free light chain: 62.2 mg/L — ABNORMAL HIGH (ref 3.3–19.4)
Kappa, lambda light chain ratio: 4.9 — ABNORMAL HIGH (ref 0.26–1.65)
Lambda free light chains: 12.7 mg/L (ref 5.7–26.3)

## 2021-10-22 ENCOUNTER — Telehealth: Payer: Self-pay | Admitting: Cardiology

## 2021-10-22 ENCOUNTER — Ambulatory Visit (HOSPITAL_COMMUNITY): Payer: Medicare HMO | Admitting: Hematology

## 2021-10-22 ENCOUNTER — Other Ambulatory Visit (HOSPITAL_COMMUNITY): Payer: Medicare HMO

## 2021-10-22 ENCOUNTER — Ambulatory Visit (HOSPITAL_COMMUNITY): Payer: Medicare HMO

## 2021-10-22 DIAGNOSIS — Z992 Dependence on renal dialysis: Secondary | ICD-10-CM | POA: Diagnosis not present

## 2021-10-22 DIAGNOSIS — N186 End stage renal disease: Secondary | ICD-10-CM | POA: Diagnosis not present

## 2021-10-22 LAB — IMMUNOFIXATION ELECTROPHORESIS
IgA: 39 mg/dL — ABNORMAL LOW (ref 61–437)
IgG (Immunoglobin G), Serum: 1042 mg/dL (ref 603–1613)
IgM (Immunoglobulin M), Srm: 8 mg/dL — ABNORMAL LOW (ref 20–172)
Total Protein ELP: 6.5 g/dL (ref 6.0–8.5)

## 2021-10-22 LAB — PROTEIN ELECTROPHORESIS, SERUM
A/G Ratio: 1.2 (ref 0.7–1.7)
Albumin ELP: 3.5 g/dL (ref 2.9–4.4)
Alpha-1-Globulin: 0.3 g/dL (ref 0.0–0.4)
Alpha-2-Globulin: 0.8 g/dL (ref 0.4–1.0)
Beta Globulin: 0.9 g/dL (ref 0.7–1.3)
Gamma Globulin: 0.9 g/dL (ref 0.4–1.8)
Globulin, Total: 2.9 g/dL (ref 2.2–3.9)
M-Spike, %: 0.5 g/dL — ABNORMAL HIGH
Total Protein ELP: 6.4 g/dL (ref 6.0–8.5)

## 2021-10-22 NOTE — Telephone Encounter (Signed)
Checking percert on the following patient for testing scheduled at [facility].     LIMITED ECHO WITH DEFINITY  10-24-2021

## 2021-10-23 ENCOUNTER — Ambulatory Visit (INDEPENDENT_AMBULATORY_CARE_PROVIDER_SITE_OTHER): Payer: Medicare HMO | Admitting: Nurse Practitioner

## 2021-10-23 ENCOUNTER — Encounter: Payer: Self-pay | Admitting: Nurse Practitioner

## 2021-10-23 ENCOUNTER — Other Ambulatory Visit: Payer: Self-pay

## 2021-10-23 VITALS — BP 108/72 | HR 105 | Ht 67.0 in | Wt 251.4 lb

## 2021-10-23 DIAGNOSIS — J449 Chronic obstructive pulmonary disease, unspecified: Secondary | ICD-10-CM

## 2021-10-23 DIAGNOSIS — E782 Mixed hyperlipidemia: Secondary | ICD-10-CM

## 2021-10-23 DIAGNOSIS — I10 Essential (primary) hypertension: Secondary | ICD-10-CM | POA: Diagnosis not present

## 2021-10-23 DIAGNOSIS — M7989 Other specified soft tissue disorders: Secondary | ICD-10-CM

## 2021-10-23 DIAGNOSIS — N529 Male erectile dysfunction, unspecified: Secondary | ICD-10-CM | POA: Insufficient documentation

## 2021-10-23 MED ORDER — SILDENAFIL CITRATE 20 MG PO TABS: 20.0000 mg | ORAL_TABLET | Freq: Every day | ORAL | 2 refills | Status: DC | PRN

## 2021-10-23 NOTE — Progress Notes (Signed)
Acute Office Visit  Subjective:    Patient ID: Ernest Barnett, male    DOB: 04/13/1956, 65 y.o.   MRN: [id number]  Chief Complaint  Patient presents with   Follow-up    3 month follow up now on dialysis     HPI Patient is in today for lab follow-up for HTN, COPD, and HLD.  He is followed by nephrology for ESRD and recently started hemodialysis. He is followed by the cancer center at [facility] for multiple myeloma.  He states his breathing has been great. He has lost weight since starting HD, and he is taking chemo for multiple myeloma, and will be at the cancer center on Thursday.  Past Medical History:  Diagnosis Date   Chronic kidney disease    COPD (chronic obstructive pulmonary disease) (HCC)    CVA (cerebral vascular accident) ([place]) 2013   Cystoid macular edema of left eye 03/07/2021   GERD (gastroesophageal reflux disease)    HOH (hard of hearing)    Hypercholesteremia    Hypertension    Left epiretinal membrane 03/07/2021   Pseudophakia 03/07/2021   Stroke ([place]) 03/24/2012   left sided weakness   Vitamin D deficiency     Past Surgical History:  Procedure Laterality Date   AV FISTULA PLACEMENT Right 02/08/2021   Procedure: RIGHT ARM ARTERIOVENOUS (AV) FISTULA CREATION;  Surgeon: [name], MD;  Location: [facility] ORS;  Service: Vascular;  Laterality: Right;   BIOPSY  11/12/2018   Procedure: BIOPSY;  Surgeon: Rogene Houston, MD;  Location: [facility] ENDO SU[facility];  Service: Endoscopy;;  colon   BIOPSY  05/10/2020   Procedure: BIOPSY;  Surgeon: Rogene Houston, MD;  Location: [facility] END[facility];  Service: Endoscopy;;  esophagus   CATARACT EXTRACTION     right eye   CATARACT EXTRACTION W/PHACO  10/08/2012   Procedure: CATARACT EXTRACTION PHACO AND INTRAOCULAR LENS PLACEMENT ([place]);  Surgeon: Tonny Branch, MD;  Location: [facility] ORS;  Service: Ophthalmology;  Laterality: Left;  CDE:6.64   CHOLECYSTECTOMY     [place]   COLONOSCOPY N/A 11/12/2018   Procedure: COLONOSCOPY;  Surgeon: Rogene Houston, MD;   Location: [facility] END[facility] SU[facility];  Service: Endoscopy;  Laterality: N/A;  1030   ELBOW FRACTURE SURGERY     left   ESOPHAGEAL DILATION N/A 11/12/2018   Procedure: ESOPHAGEAL DILATION;  Surgeon: Rogene Houston, MD;  Location: [facility];  Service: Endoscopy;  Laterality: N/A;   ESOPHAGEAL DILATION N/A 05/10/2020   Procedure: ESOPHAGEAL DILATION;  Surgeon: Rogene Houston, MD;  Location: [facility];  Service: Endoscopy;  Laterality: N/A;   ESOPHAGOGASTRODUODENOSCOPY N/A 11/12/2018   Procedure: ESOPHAGOGASTRODUODENOSCOPY (EGD);  Surgeon: Rogene Houston, MD;  Location: [facility];  Service: Endoscopy;  Laterality: N/A;   ESOPHAGOGASTRODUODENOSCOPY N/A 05/10/2020   Procedure: ESOPHAGOGASTRODUODENOSCOPY (EGD);  Surgeon: Rogene Houston, MD;  Location: [facility];  Service: Endoscopy;  Laterality: N/A;  210   HERNIA REPAIR     right inguinal   HYDROCELE EXCISION / REPAIR     IR FLUORO GUIDE CV LINE LEFT  08/17/2021   IR US GUIDE VASC ACCESS LEFT  08/17/2021   LIGATION OF COMPETING BRANCHES OF ARTERIOVENOUS FISTULA Right 09/11/2021   Procedure: LIGATION OF COMPETING BRANCHES OF RIGHT ARM ARTERIOVENOUS FISTULA;  Surgeon: [name], MD;  Location: [facility];  Service: Vascular;  Laterality: Right;   POLYPECTOMY  11/12/2018   Procedure: POLYPECTOMY;  Surgeon: Rogene Houston, MD;  Location: [facility];  Service: Endoscopy;;  colon    RETINAL DETACHMENT SURGERY Left 2019   SPLENECTOMY, TOTAL     TONSILLECTOMY     VOCAL CORD INJECTION     removal of polyp-2005    History reviewed. No pertinent family history.  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  Tobacco Use   Smoking status: Former    Packs/day: 3.00    Years: 30.00     Pack years: 90.00    Types: Cigarettes   Smokeless tobacco: Current   Tobacco comments:    Currently vape  Vaping Use   Vaping Use: Every day   Substances: Nicotine  Substance and Sexual Activity   Alcohol use: Yes    Alcohol/week: 1.0 - 2.0 standard drink    Types: 1 - 2 Cans of beer per week    Comment: Occasionally   Drug use: No   Sexual activity: Not Currently    Birth control/protection: Abstinence, None  Other Topics Concern   Not on file  Social History Narrative   2 children, both nearby   Social Determinants of Health   Financial Resource Strain: Low Risk    Difficulty of Paying Living Expenses: Not hard at all  Food Insecurity: No Food Insecurity   Worried About Charity fundraiser in the Last Year: Never true   Arboriculturist in the Last Year: Never true  Transportation Needs: No Transportation Needs   Lack of Transportation (Medical): No   Lack of Transportation (Non-Medical): No  Physical Activity: Insufficiently Active   Days of Exercise per Week: 3 days   Minutes of Exercise per Session: 30 min  Stress: No Stress Concern Present   Feeling of Stress : Only a little  Social Connections: Socially Isolated   Frequency of Communication with Friends and Family: More than three times a week   Frequency of Social Gatherings with Friends and Family: More than three times a week   Attends Religious Services: Never   Marine scientist or Organizations: No   Attends Music therapist: Never   Marital Status: Divorced  Human resources officer Violence: Not At Risk   Fear of Current or Ex-Partner: No   Emotionally Abused: No   Physically Abused: No   Sexually Abused: No    Outpatient Medications Prior to Visit  Medication Sig Dispense Refill   albuterol (VENTOLIN HFA) 108 (90 Base) MCG/ACT inhaler Inhale 2 puffs into the lungs every 6 (six) hours as needed for wheezing or shortness of breath. 8 g 0   aspirin EC 81 MG tablet Take 1 tablet (81 mg total)  by mouth daily. 30 tablet 11   bortezomib IV (VELCADE) 3.5 MG injection Inject 1.5 mg/m2 into the vein once a week.     budesonide-formoterol (SYMBICORT) 80-4.5 MCG/ACT inhaler Inhale 2 puffs into the lungs 2 (two) times daily as needed (shortness of breath).     calcitRIOL (ROCALTROL) 0.25 MCG capsule Take 0.25 mcg by mouth daily.     cholecalciferol (VITAMIN D3) 25 MCG (1000 UNIT) tablet Take 2,000 Units by mouth daily.     CYCLOPHOSPHAMIDE IV Inject 300 mg/m2 into the vein once a week.  dexamethasone (DECADRON) 4 MG tablet Take 2.5 tablets (10 mg total) by mouth once a week. Take at home along with Tylenol and Benadryl weekly on the day of daratumumab injection. 30 tablet 2   dexamethasone 40 mg in sodium chloride 0.9 % 50 mL Inject 40 mg into the vein once a week.     docusate sodium (COLACE) 100 MG capsule Take 100 mg by mouth daily.     gabapentin (NEURONTIN) 300 MG capsule Take 1 capsule (300 mg total) by mouth 2 (two) times daily. 60 capsule 1   midodrine (PROAMATINE) 10 MG tablet Take by mouth.     Omega-3 Fatty Acids (FISH OIL) 1000 MG CAPS Take 1,000 mg by mouth in the morning, at noon, and at bedtime.      oxyCODONE (OXY IR/ROXICODONE) 5 MG immediate release tablet Take 1 tablet (5 mg total) by mouth 2 (two) times daily as needed for severe pain. 30 tablet 0   pantoprazole (PROTONIX) 40 MG tablet Take 1 tablet (40 mg total) by mouth 2 (two) times daily. 180 tablet 3   polyethylene glycol (MIRALAX / GLYCOLAX) packet Take 17 g by mouth daily. Patient states that he takes as needed. 30 each 5   pravastatin (PRAVACHOL) 80 MG tablet Take 1 tablet (80 mg total) by mouth daily. 90 tablet 3   sodium bicarbonate 325 MG tablet Take 325 mg by mouth 3 (three) times daily.     tamsulosin (FLOMAX) 0.4 MG CAPS capsule Take 0.4 mg by mouth daily.      tiZANidine (ZANAFLEX) 2 MG tablet Take 1 tablet (2 mg total) by mouth every 6 (six) hours as needed for muscle spasms. 30 tablet 1   torsemide  (DEMADEX) 100 MG tablet Take 100 mg by mouth 2 (two) times daily.     Facility-Administered Medications Prior to Visit  Medication Dose Route Frequency Provider Last Rate Last Admin   palonosetron (ALOXI) 0.25 MG/5ML injection             No Known Allergies  Review of Systems  Constitutional: Negative.   Respiratory: Negative.    Cardiovascular: Negative.   Genitourinary:        Erectile dysfunction and out of his sildenafil  Musculoskeletal: Negative.   Psychiatric/Behavioral: Negative.        Objective:    Physical Exam Constitutional:      Appearance: Normal appearance.  Cardiovascular:     Rate and Rhythm: Normal rate and regular rhythm.     Pulses: Normal pulses.     Heart sounds: Normal heart sounds.  Pulmonary:     Effort: Pulmonary effort is normal.     Breath sounds: Normal breath sounds.  Skin:    Comments: Dialysis catheter to left upper chest; AV fistula to right arm  Neurological:     Mental Status: He is alert.  Psychiatric:        Mood and Affect: Mood normal.        Behavior: Behavior normal.        Thought Content: Thought content normal.        Judgment: Judgment normal.    BP 108/72    Pulse (!) 105    Ht '5\' 7"'  (1.702 m)    Wt 251 lb 6.4 oz (114 kg)    SpO2 92%    BMI 39.37 kg/m  Wt Readings from Last 3 Encounters:  10/23/21 251 lb 6.4 oz (114 kg)  10/18/21 251 lb 3.2 oz (113.9 kg)  10/17/21 252  lb 6.4 oz (114.5 kg)    Health Maintenance Due  Topic Date Due   COVID-19 Vaccine (1) Never done   Pneumococcal Vaccine 105-11 Years old (1 - PCV) Never done   Zoster Vaccines- Shingrix (1 of 2) Never done    There are no preventive care reminders to display for this patient.   Lab Results  Component Value Date   TSH 1.200 06/20/2021   Lab Results  Component Value Date   WBC 7.7 10/18/2021   HGB 11.1 (L) 10/18/2021   HCT 33.2 (L) 10/18/2021   MCV 98.5 10/18/2021   PLT 195 10/18/2021   Lab Results  Component Value Date   NA 135  10/18/2021   K 3.7 10/18/2021   CO2 26 10/18/2021   GLUCOSE 107 (H) 10/18/2021   BUN 19 10/18/2021   CREATININE 3.11 (H) 10/18/2021   BILITOT 0.6 10/18/2021   ALKPHOS 28 (L) 10/18/2021   AST 22 10/18/2021   ALT 31 10/18/2021   PROT 6.8 10/18/2021   ALBUMIN 3.5 10/18/2021   CALCIUM 8.2 (L) 10/18/2021   ANIONGAP 10 10/18/2021   Lab Results  Component Value Date   CHOL 179 06/20/2021   Lab Results  Component Value Date   HDL 34 (L) 06/20/2021   Lab Results  Component Value Date   LDLCALC 106 (H) 06/20/2021   Lab Results  Component Value Date   TRIG 223 (H) 06/20/2021   No results found for: CHOLHDL No results found for: HGBA1C     Assessment & Plan:   Problem List Items Addressed This Visit       Cardiovascular and Mediastinum   Essential hypertension, benign    BP Readings from Last 3 Encounters:  10/23/21 108/72  10/18/21 98/69  10/17/21 94/63  -doing well with hemodialysis      Relevant Medications   sildenafil (REVATIO) 20 MG tablet     Respiratory   COPD (chronic obstructive pulmonary disease) (HCC)    -breathing improved since starting HD -continue current meds        Other   Mixed hyperlipidemia    -ordered lipid panel; he will have those drawn at either cancer center or dialysis clinic      Relevant Medications   sildenafil (REVATIO) 20 MG tablet   Other Relevant Orders   Lipid Panel With LDL/HDL Ratio   Leg swelling    -improved since starting hemodialysis; SOB improved as well      Erectile dysfunction - Primary    -refilled sildenafil -he states he takes 5 of the 20 mg tablets prior to intercourse; offered to send in 100 mg tablet, but he states he tried that before and it was too expensive      Relevant Medications   sildenafil (REVATIO) 20 MG tablet     Meds ordered this encounter  Medications   sildenafil (REVATIO) 20 MG tablet    Sig: Take 1 tablet (20 mg total) by mouth 5 (five) times daily as needed.    Dispense:  30  tablet    Refill:  2     Noreene Larsson, NP

## 2021-10-23 NOTE — Assessment & Plan Note (Signed)
-  refilled sildenafil -he states he takes 5 of the 20 mg tablets prior to intercourse; offered to send in 100 mg tablet, but he states he tried that before and it was too expensive

## 2021-10-23 NOTE — Assessment & Plan Note (Signed)
-  breathing improved since starting HD -continue current meds

## 2021-10-23 NOTE — Assessment & Plan Note (Signed)
-  ordered lipid panel; he will have those drawn at either cancer center or dialysis clinic

## 2021-10-23 NOTE — Assessment & Plan Note (Signed)
-  improved since starting hemodialysis; SOB improved as well

## 2021-10-23 NOTE — Assessment & Plan Note (Signed)
BP Readings from Last 3 Encounters:  10/23/21 108/72  10/18/21 98/69  10/17/21 94/63   -doing well with hemodialysis

## 2021-10-23 NOTE — Patient Instructions (Signed)
Please have lipid panel drawn at either dialysis or cancer center. You will need to be fasting for these labs.  I will be moving to [facility] located at [address] [place], [place] [address] effective Nov 04, 2021. If you would like to establish care with [facility] please call [phone].

## 2021-10-24 ENCOUNTER — Inpatient Hospital Stay (HOSPITAL_COMMUNITY): Payer: Medicare HMO

## 2021-10-24 ENCOUNTER — Ambulatory Visit (HOSPITAL_COMMUNITY)
Admission: RE | Admit: 2021-10-24 | Discharge: 2021-10-24 | Disposition: A | Discharge status: Home / Self Care | Payer: Medicare HMO | Source: Ambulatory Visit | Attending: Cardiology | Admitting: Cardiology

## 2021-10-24 ENCOUNTER — Ambulatory Visit: Payer: Medicare HMO | Admitting: Nurse Practitioner

## 2021-10-24 DIAGNOSIS — R0602 Shortness of breath: Secondary | ICD-10-CM | POA: Insufficient documentation

## 2021-10-24 DIAGNOSIS — Z992 Dependence on renal dialysis: Secondary | ICD-10-CM | POA: Diagnosis not present

## 2021-10-24 DIAGNOSIS — N186 End stage renal disease: Secondary | ICD-10-CM | POA: Diagnosis not present

## 2021-10-24 DIAGNOSIS — C9000 Multiple myeloma not having achieved remission: Secondary | ICD-10-CM | POA: Diagnosis not present

## 2021-10-24 DIAGNOSIS — Z5112 Encounter for antineoplastic immunotherapy: Secondary | ICD-10-CM | POA: Diagnosis not present

## 2021-10-24 DIAGNOSIS — D509 Iron deficiency anemia, unspecified: Secondary | ICD-10-CM | POA: Diagnosis not present

## 2021-10-24 LAB — COMPREHENSIVE METABOLIC PANEL
ALT: 34 U/L (ref 0–44)
AST: 18 U/L (ref 15–41)
Albumin: 3.6 g/dL (ref 3.5–5.0)
Alkaline Phosphatase: 30 U/L — ABNORMAL LOW (ref 38–126)
Anion gap: 11 (ref 5–15)
BUN: 28 mg/dL — ABNORMAL HIGH (ref 8–23)
CO2: 25 mmol/L (ref 22–32)
Calcium: 8.4 mg/dL — ABNORMAL LOW (ref 8.9–10.3)
Chloride: 101 mmol/L (ref 98–111)
Creatinine, Ser: 3.79 mg/dL — ABNORMAL HIGH (ref 0.61–1.24)
GFR, Estimated: 17 mL/min — ABNORMAL LOW (ref >60)
Glucose, Bld: 97 mg/dL (ref 70–99)
Potassium: 3.6 mmol/L (ref 3.5–5.1)
Sodium: 137 mmol/L (ref 135–145)
Total Bilirubin: 0.4 mg/dL (ref 0.3–1.2)
Total Protein: 6.8 g/dL (ref 6.5–8.1)

## 2021-10-24 LAB — CBC WITH DIFFERENTIAL/PLATELET
Abs Immature Granulocytes: 0.03 10*3/uL (ref 0.00–0.07)
Basophils Absolute: 0 10*3/uL (ref 0.0–0.1)
Basophils Relative: 0 %
Eosinophils Absolute: 0.1 10*3/uL (ref 0.0–0.5)
Eosinophils Relative: 1 %
HCT: 32.3 % — ABNORMAL LOW (ref 39.0–52.0)
Hemoglobin: 10.8 g/dL — ABNORMAL LOW (ref 13.0–17.0)
Immature Granulocytes: 0 %
Lymphocytes Relative: 35 %
Lymphs Abs: 3.2 10*3/uL (ref 0.7–4.0)
MCH: 32.9 pg (ref 26.0–34.0)
MCHC: 33.4 g/dL (ref 30.0–36.0)
MCV: 98.5 fL (ref 80.0–100.0)
Monocytes Absolute: 1.1 10*3/uL — ABNORMAL HIGH (ref 0.1–1.0)
Monocytes Relative: 12 %
Neutro Abs: 4.8 10*3/uL (ref 1.7–7.7)
Neutrophils Relative %: 52 %
Platelets: 202 10*3/uL (ref 150–400)
RBC: 3.28 MIL/uL — ABNORMAL LOW (ref 4.22–5.81)
RDW: 14.3 % (ref 11.5–15.5)
WBC: 9.3 10*3/uL (ref 4.0–10.5)
nRBC: 0.2 % (ref 0.0–0.2)

## 2021-10-24 LAB — MAGNESIUM: Magnesium: 2 mg/dL (ref 1.7–2.4)

## 2021-10-24 LAB — LACTATE DEHYDROGENASE: LDH: 229 U/L — ABNORMAL HIGH (ref 98–192)

## 2021-10-24 MED ORDER — PERFLUTREN LIPID MICROSPHERE
1.0000 mL | INTRAVENOUS | Status: AC | PRN
  Administered 2021-10-24: 09:00:00 3 mL via INTRAVENOUS

## 2021-10-24 NOTE — Progress Notes (Signed)
*  PRELIMINARY RESULTS* Echocardiogram Limited 2-D Echocardiogram  has been performed with Definity.  Ernest Barnett 10/24/2021, 8:57 AM

## 2021-10-25 ENCOUNTER — Other Ambulatory Visit: Payer: Medicare HMO

## 2021-10-25 ENCOUNTER — Other Ambulatory Visit: Payer: Self-pay

## 2021-10-25 ENCOUNTER — Encounter (HOSPITAL_COMMUNITY): Payer: Self-pay

## 2021-10-25 ENCOUNTER — Inpatient Hospital Stay (HOSPITAL_COMMUNITY): Payer: Medicare HMO

## 2021-10-25 VITALS — BP 90/69 | HR 95 | Temp 99.0°F | Resp 18 | Ht 67.0 in | Wt 249.4 lb

## 2021-10-25 DIAGNOSIS — C9000 Multiple myeloma not having achieved remission: Secondary | ICD-10-CM

## 2021-10-25 DIAGNOSIS — Z5112 Encounter for antineoplastic immunotherapy: Secondary | ICD-10-CM | POA: Diagnosis not present

## 2021-10-25 LAB — KAPPA/LAMBDA LIGHT CHAINS
Kappa free light chain: 62.1 mg/L — ABNORMAL HIGH (ref 3.3–19.4)
Kappa, lambda light chain ratio: 4.85 — ABNORMAL HIGH (ref 0.26–1.65)
Lambda free light chains: 12.8 mg/L (ref 5.7–26.3)

## 2021-10-25 MED ORDER — DARATUMUMAB-HYALURONIDASE-FIHJ 1800-30000 MG-UT/15ML ~~LOC~~ SOLN
1800.0000 mg | Freq: Once | SUBCUTANEOUS | Status: AC
  Administered 2021-10-25: 11:00:00 1800 mg via SUBCUTANEOUS
  Filled 2021-10-25: qty 15

## 2021-10-25 MED ORDER — BORTEZOMIB CHEMO SQ INJECTION 3.5 MG (2.5MG/ML)
1.0000 mg/m2 | Freq: Once | INTRAMUSCULAR | Status: AC
  Administered 2021-10-25: 11:00:00 2.25 mg via SUBCUTANEOUS
  Filled 2021-10-25: qty 0.9

## 2021-10-25 NOTE — Patient Instructions (Signed)
[facility]  Discharge Instructions: Thank you for choosing [facility] to provide your oncology and hematology care.  If you have a lab appointment with the [facility], please come in thru the Main Entrance and check in at the main information desk.  Wear comfortable clothing and clothing appropriate for easy access to any Portacath or PICC line.   We strive to give you quality time with your provider. You may need to reschedule your appointment if you arrive late (15 or more minutes).  Arriving late affects you and other patients whose appointments are after yours.  Also, if you miss three or more appointments without notifying the office, you may be dismissed from the clinic at the providers discretion.      For prescription refill requests, have your pharmacy contact our office and allow 72 hours for refills to be completed.    Today you received the following chemotherapy and/or immunotherapy agents Velcade and Daratumumab.    To help prevent nausea and vomiting after your treatment, we encourage you to take your nausea medication as directed.  BELOW ARE SYMPTOMS THAT SHOULD BE REPORTED IMMEDIATELY: *FEVER GREATER THAN 100.4 F (38 C) OR HIGHER *CHILLS OR SWEATING *NAUSEA AND VOMITING THAT IS NOT CONTROLLED WITH YOUR NAUSEA MEDICATION *UNUSUAL SHORTNESS OF BREATH *UNUSUAL BRUISING OR BLEEDING *URINARY PROBLEMS (pain or burning when urinating, or frequent urination) *BOWEL PROBLEMS (unusual diarrhea, constipation, pain near the anus) TENDERNESS IN MOUTH AND THROAT WITH OR WITHOUT PRESENCE OF ULCERS (sore throat, sores in mouth, or a toothache) UNUSUAL RASH, SWELLING OR PAIN  UNUSUAL VAGINAL DISCHARGE OR ITCHING   Items with * indicate a potential emergency and should be followed up as soon as possible or go to the Emergency Department if any problems should occur.  Please show the CHEMOTHERAPY ALERT CARD or IMMUNOTHERAPY ALERT CARD at check-in to the  Emergency Department and triage nurse.  Should you have questions after your visit or need to cancel or reschedule your appointment, please contact [facility] [phone]  and follow the prompts.  Office hours are 8:00 a.m. to 4:30 p.m. Monday - Friday. Please note that voicemails left after 4:00 p.m. may not be returned until the following business day.  We are closed weekends and major holidays. You have access to a nurse at all times for urgent questions. Please call the main number to the clinic [phone] and follow the prompts.  For any non-urgent questions, you may also contact your provider using MyChart. We now offer e-Visits for anyone 48 and older to request care online for non-urgent symptoms. For details visit [email or web address].   Also download the MyChart app! Go to the app store, search "MyChart", open the app, select [facility], and log in with your MyChart username and password.  Due to Covid, a mask is required upon entering the hospital/clinic. If you do not have a mask, one will be given to you upon arrival. For doctor visits, patients may have 1 support person aged 54 or older with them. For treatment visits, patients cannot have anyone with them due to current Covid guidelines and our immunocompromised population.

## 2021-10-25 NOTE — Progress Notes (Signed)
Serum Creatinine 3.79 for today's treatment.  Ok to treat per Dr. Delton Coombes.   Patient took Tylenol, Benadryl, and Dexamethasone at 0850.  Pharmacy notified.    Patient tolerated Velcade and Daratumumab injection with no complaints voiced.  Lab work reviewed.  See MAR for details.  Injection site clean and dry with no bruising or swelling noted.  Patient stable during and after injection.  Band aid applied.  VSS.  Patient left in satisfactory condition with no s/s of distress noted.

## 2021-10-26 ENCOUNTER — Telehealth: Payer: Self-pay

## 2021-10-26 ENCOUNTER — Ambulatory Visit: Payer: Medicare HMO | Admitting: Family Medicine

## 2021-10-26 DIAGNOSIS — N186 End stage renal disease: Secondary | ICD-10-CM | POA: Diagnosis not present

## 2021-10-26 DIAGNOSIS — R0602 Shortness of breath: Secondary | ICD-10-CM

## 2021-10-26 DIAGNOSIS — Z992 Dependence on renal dialysis: Secondary | ICD-10-CM | POA: Diagnosis not present

## 2021-10-26 NOTE — Telephone Encounter (Signed)
-----   Message from Arnoldo Lenis, MD sent at 10/25/2021  4:16 PM EST ----- Overall echo shows normal heart function, nothing so far to explain his symptoms. Can we get a lexiscan for shortness of breath to look for any blockages in the heart that could be causing the symptoms.   Zandra Abts MD

## 2021-10-26 NOTE — Telephone Encounter (Signed)
Pt agreeable to having Lexiscan. Only available day would be Tuesday.

## 2021-10-29 DIAGNOSIS — N186 End stage renal disease: Secondary | ICD-10-CM | POA: Diagnosis not present

## 2021-10-29 DIAGNOSIS — Z992 Dependence on renal dialysis: Secondary | ICD-10-CM | POA: Diagnosis not present

## 2021-10-30 ENCOUNTER — Other Ambulatory Visit (HOSPITAL_COMMUNITY): Payer: Medicare HMO

## 2021-10-30 ENCOUNTER — Ambulatory Visit (HOSPITAL_COMMUNITY): Payer: Medicare HMO

## 2021-10-30 LAB — IMMUNOFIXATION ELECTROPHORESIS
IgA: 50 mg/dL — ABNORMAL LOW (ref 61–437)
IgG (Immunoglobin G), Serum: 1000 mg/dL (ref 603–1613)
IgM (Immunoglobulin M), Srm: 10 mg/dL — ABNORMAL LOW (ref 20–172)
Total Protein ELP: 6.4 g/dL (ref 6.0–8.5)

## 2021-10-30 LAB — PROTEIN ELECTROPHORESIS, SERUM
A/G Ratio: 1.3 (ref 0.7–1.7)
Albumin ELP: 3.6 g/dL (ref 2.9–4.4)
Alpha-1-Globulin: 0.3 g/dL (ref 0.0–0.4)
Alpha-2-Globulin: 0.8 g/dL (ref 0.4–1.0)
Beta Globulin: 0.8 g/dL (ref 0.7–1.3)
Gamma Globulin: 0.9 g/dL (ref 0.4–1.8)
Globulin, Total: 2.8 g/dL (ref 2.2–3.9)
M-Spike, %: 0.4 g/dL — ABNORMAL HIGH
Total Protein ELP: 6.4 g/dL (ref 6.0–8.5)

## 2021-10-31 ENCOUNTER — Telehealth: Payer: Self-pay | Admitting: Cardiology

## 2021-10-31 DIAGNOSIS — N186 End stage renal disease: Secondary | ICD-10-CM | POA: Diagnosis not present

## 2021-10-31 DIAGNOSIS — Z992 Dependence on renal dialysis: Secondary | ICD-10-CM | POA: Diagnosis not present

## 2021-10-31 NOTE — Telephone Encounter (Signed)
New Message:     Please call, they are confused about all the appointments on 11-13-21.

## 2021-10-31 NOTE — Progress Notes (Signed)
[place], [place] [address]   CLINIC:  Medical Oncology/Hematology  PCP:  Lindell Spar, MD [address] / [place] [address] [phone]   REASON FOR VISIT:  Follow-up for multiple myeloma  PRIOR THERAPY: CyBorD  NGS Results: not done  CURRENT THERAPY: Velcade, daratumumab and dexamethasone  BRIEF ONCOLOGIC HISTORY:  Oncology History  Multiple myeloma ([place])  03/06/2021 Initial Diagnosis   Multiple myeloma ([place])   03/06/2021 Cancer Staging   Staging form: Plasma Cell Myeloma and Plasma Cell Disorders, AJCC 8th Edition - Clinical stage from 03/06/2021: RISS Stage II (Beta-2-microglobulin (mg/L): 5.5, Albumin (g/dL): 3.5, ISS: Stage III, High-risk cytogenetics: Absent, LDH: Normal) - Signed by Derek Jack, MD on 03/06/2021 Stage prefix: Initial diagnosis Beta 2 microglobulin range (mg/L): Greater than or equal to 5.5 Albumin range (g/dL): Greater than or equal to 3.5 Cytogenetics: No abnormalities Pretreatment monoclonal protein level in serum (M spike) (g/dL): 2 Pretreatment serum free kappa light chain level (g/L): 794 Pretreatment serum free lambda light chain level (g/L): 15.9    03/14/2021 - 07/04/2021 Chemotherapy   Patient is on Treatment Plan : MULTIPLE MYELOMA CyBorD - Weekly Bortezomib     08/30/2021 -  Chemotherapy   Patient is on Treatment Plan : MYELOMA RELAPSED / REFRACTORY Daratumumab SQ + Bortezomib + Dexamethasone (DaraVd) q21d / Daratumumab SQ q28d        CANCER STAGING:  Cancer Staging  Multiple myeloma ([place]) Staging form: Plasma Cell Myeloma and Plasma Cell Disorders, AJCC 8th Edition - Clinical stage from 03/06/2021: RISS Stage II (Beta-2-microglobulin (mg/L): 5.5, Albumin (g/dL): 3.5, ISS: Stage III, High-risk cytogenetics: Absent, LDH: Normal) - Signed by Derek Jack, MD on 03/06/2021   INTERVAL HISTORY:  Ernest Barnett, a 65 y.o. male, returns for routine follow-up of his multiple  myeloma. Ernest Barnett was last seen on 10/04/2021.   Today he reports feeling good. He reports cramping in his legs and thumbs. His is taking Gabapentin BID which is improving the numbness in the soles of his feet. His appetite is good.   REVIEW OF SYSTEMS:  Review of Systems  Constitutional:  Negative for appetite change and fatigue (75%).  Respiratory:  Positive for cough.   Musculoskeletal:  Positive for myalgias (cramps).  All other systems reviewed and are negative.  PAST MEDICAL/SURGICAL HISTORY:  Past Medical History:  Diagnosis Date   Chronic kidney disease    COPD (chronic obstructive pulmonary disease) (HCC)    CVA (cerebral vascular accident) ([place]) 2013   Cystoid macular edema of left eye 03/07/2021   GERD (gastroesophageal reflux disease)    HOH (hard of hearing)    Hypercholesteremia    Hypertension    Left epiretinal membrane 03/07/2021   Pseudophakia 03/07/2021   Stroke ([place]) 03/24/2012   left sided weakness   Vitamin D deficiency    Past Surgical History:  Procedure Laterality Date   AV FISTULA PLACEMENT Right 02/08/2021   Procedure: RIGHT ARM ARTERIOVENOUS (AV) FISTULA CREATION;  Surgeon: [name], MD;  Location: [facility];  Service: Vascular;  Laterality: Right;   BIOPSY  11/12/2018   Procedure: BIOPSY;  Surgeon: Rogene Houston, MD;  Location: [facility];  Service: Endoscopy;;  colon   BIOPSY  05/10/2020   Procedure: BIOPSY;  Surgeon: Rogene Houston, MD;  Location: [facility];  Service: Endoscopy;;  esophagus   CATARACT EXTRACTION     right eye   CATARACT EXTRACTION [facility]  10/08/2012  Procedure: CATARACT EXTRACTION PHACO AND INTRAOCULAR LENS PLACEMENT (IOC);  Surgeon: Tonny Branch, MD;  Location: [facility];  Service: Ophthalmology;  Laterality: Left;  CDE:6.64   CHOLECYSTECTOMY     [place]   COLONOSCOPY N/A 11/12/2018   Procedure: COLONOSCOPY;  Surgeon: Rogene Houston, MD;  Location: [facility];  Service: Endoscopy;  Laterality: N/A;  1030   ELBOW FRACTURE SURGERY      left   ESOPHAGEAL DILATION N/A 11/12/2018   Procedure: ESOPHAGEAL DILATION;  Surgeon: Rogene Houston, MD;  Location: [facility];  Service: Endoscopy;  Laterality: N/A;   ESOPHAGEAL DILATION N/A 05/10/2020   Procedure: ESOPHAGEAL DILATION;  Surgeon: Rogene Houston, MD;  [facility];  Service: Endoscopy;  Laterality: N/A;   ESOPHAGOGASTRODUODENOSCOPY N/A 11/12/2018   Procedure: ESOPHAGOGASTRODUODENOSCOPY (EGD);  Surgeon: Rogene Houston, MD;  [facility];  Service: Endoscopy;  Laterality: N/A;   ESOPHAGOGASTRODUODENOSCOPY N/A 05/10/2020   Procedure: ESOPHAGOGASTRODUODENOSCOPY (EGD);  Surgeon: Rogene Houston, MD;  [facility];  Service: Endoscopy;  Laterality: N/A;  210   HERNIA REPAIR     right inguinal   HYDROCELE EXCISION / REPAIR     IR FLUORO GUIDE CV LINE LEFT  08/17/2021   IR US GUIDE VASC ACCESS LEFT  08/17/2021   LIGATION OF COMPETING BRANCHES OF ARTERIOVENOUS FISTULA Right 09/11/2021   Procedure: LIGATION OF COMPETING BRANCHES OF RIGHT ARM ARTERIOVENOUS FISTULA;  Surgeon: [name], MD;  Location: [facility];  Service: Vascular;  Laterality: Right;   POLYPECTOMY  11/12/2018   Procedure: POLYPECTOMY;  Surgeon: Rogene Houston, MD;  [facility];  Service: Endoscopy;;  colon    RETINAL DETACHMENT SURGERY Left 2019   SPLENECTOMY, TOTAL     TONSILLECTOMY     VOCAL CORD INJECTION     removal of polyp-2005    SOCIAL HISTORY:  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  Tobacco Use   Smoking status: Former    Packs/day: 3.00    Years: 30.00    Pack years: 90.00    Types: Cigarettes   Smokeless tobacco: Current   Tobacco comments:    Currently vape  Vaping Use    Vaping Use: Every day   Substances: Nicotine  Substance and Sexual Activity   Alcohol use: Yes    Alcohol/week: 1.0 - 2.0 standard drink    Types: 1 - 2 Cans of beer per week    Comment: Occasionally   Drug use: No   Sexual activity: Not Currently    Birth control/protection: Abstinence, None  Other Topics Concern   Not on file  Social History Narrative   2 children, both nearby   Social Determinants of Health   Financial Resource Strain: Low Risk    Difficulty of Paying Living Expenses: Not hard at all  Food Insecurity: No Food Insecurity   Worried About Charity fundraiser in the Last Year: Never true   Arboriculturist in the Last Year: Never true  Transportation Needs: No Transportation Needs   Lack of Transportation (Medical): No   Lack of Transportation (Non-Medical): No  Physical Activity: Insufficiently Active   Days  of Exercise per Week: 3 days   Minutes of Exercise per Session: 30 min  Stress: No Stress Concern Present   Feeling of Stress : Only a little  Social Connections: Socially Isolated   Frequency of Communication with Friends and Family: More than three times a week   Frequency of Social Gatherings with Friends and Family: More than three times a week   Attends Religious Services: Never   Marine scientist or Organizations: No   Attends Music therapist: Never   Marital Status: Divorced  Human resources officer Violence: Not At Risk   Fear of Current or Ex-Partner: No   Emotionally Abused: No   Physically Abused: No   Sexually Abused: No    FAMILY HISTORY:  No family history on file.  CURRENT MEDICATIONS:  Current Outpatient Medications  Medication Sig Dispense Refill   albuterol (VENTOLIN HFA) 108 (90 Base) MCG/ACT inhaler Inhale 2 puffs into the lungs every 6 (six) hours as needed for wheezing or shortness of breath. 8 g 0   aspirin EC 81 MG tablet Take 1 tablet (81 mg total) by mouth daily. 30 tablet 11   bortezomib IV (VELCADE) 3.5  MG injection Inject 1.5 mg/m2 into the vein once a week.     budesonide-formoterol (SYMBICORT) 80-4.5 MCG/ACT inhaler Inhale 2 puffs into the lungs 2 (two) times daily as needed (shortness of breath).     calcitRIOL (ROCALTROL) 0.25 MCG capsule Take 0.25 mcg by mouth daily.     cholecalciferol (VITAMIN D3) 25 MCG (1000 UNIT) tablet Take 2,000 Units by mouth daily.     CYCLOPHOSPHAMIDE IV Inject 300 mg/m2 into the vein once a week.     dexamethasone (DECADRON) 4 MG tablet Take 2.5 tablets (10 mg total) by mouth once a week. Take at home along with Tylenol and Benadryl weekly on the day of daratumumab injection. 30 tablet 2   dexamethasone 40 mg in sodium chloride 0.9 % 50 mL Inject 40 mg into the vein once a week.     gabapentin (NEURONTIN) 300 MG capsule Take 1 capsule (300 mg total) by mouth 2 (two) times daily. 60 capsule 1   Omega-3 Fatty Acids (FISH OIL) 1000 MG CAPS Take 1,000 mg by mouth in the morning, at noon, and at bedtime.      oxyCODONE (OXY IR/ROXICODONE) 5 MG immediate release tablet Take 1 tablet (5 mg total) by mouth 2 (two) times daily as needed for severe pain. 30 tablet 0   pantoprazole (PROTONIX) 40 MG tablet Take 1 tablet (40 mg total) by mouth 2 (two) times daily. 180 tablet 3   polyethylene glycol (MIRALAX / GLYCOLAX) packet Take 17 g by mouth daily. Patient states that he takes as needed. 30 each 5   pravastatin (PRAVACHOL) 80 MG tablet Take 1 tablet (80 mg total) by mouth daily. 90 tablet 3   sildenafil (REVATIO) 20 MG tablet Take 1 tablet (20 mg total) by mouth 5 (five) times daily as needed. 30 tablet 2   sodium bicarbonate 325 MG tablet Take 325 mg by mouth 3 (three) times daily.     tamsulosin (FLOMAX) 0.4 MG CAPS capsule Take 0.4 mg by mouth daily.      tiZANidine (ZANAFLEX) 2 MG tablet TAKE 1 TABLET BY MOUTH EVERY 6 HOURS AS NEEDED FOR MUSCLE SPASM 30 tablet 0   torsemide (DEMADEX) 100 MG tablet Take 100 mg by mouth 2 (two) times daily.     docusate sodium (COLACE)  100 MG capsule  Take 100 mg by mouth daily. (Patient not taking: Reported on 11/01/2021)     midodrine (PROAMATINE) 10 MG tablet Take by mouth. (Patient not taking: Reported on 11/01/2021)     No current facility-administered medications for this visit.   Facility-Administered Medications Ordered in Other Visits  Medication Dose Route Frequency Provider Last Rate Last Admin   palonosetron (ALOXI) 0.25 MG/5ML injection             ALLERGIES:  No Known Allergies  PHYSICAL EXAM:  Performance status (ECOG): 1 - Symptomatic but completely ambulatory  Vitals:   11/01/21 1043  BP: 98/72  Pulse: (!) 103  Resp: 19  Temp: 98.2 F (36.8 C)  SpO2: 95%   Wt Readings from Last 3 Encounters:  11/01/21 251 lb 3.2 oz (113.9 kg)  10/25/21 249 lb 6.4 oz (113.1 kg)  10/23/21 251 lb 6.4 oz (114 kg)   Physical Exam Vitals reviewed.  Constitutional:      Appearance: Normal appearance.  Cardiovascular:     Rate and Rhythm: Normal rate and regular rhythm.     Pulses: Normal pulses.     Heart sounds: Normal heart sounds.  Pulmonary:     Effort: Pulmonary effort is normal.     Breath sounds: Normal breath sounds.  Neurological:     General: No focal deficit present.     Mental Status: He is alert and oriented to person, place, and time.  Psychiatric:        Mood and Affect: Mood normal.        Behavior: Behavior normal.     LABORATORY DATA:  I have reviewed the labs as listed.  CBC Latest Ref Rng & Units 11/01/2021 10/24/2021 10/18/2021  WBC 4.0 - 10.5 K/uL PENDING 9.3 7.7  Hemoglobin 13.0 - 17.0 g/dL 10.8(L) 10.8(L) 11.1(L)  Hematocrit 39.0 - 52.0 % 31.7(L) 32.3(L) 33.2(L)  Platelets 150 - 400 K/uL 199 202 195   CMP Latest Ref Rng & Units 11/01/2021 10/24/2021 10/18/2021  Glucose 70 - 99 mg/dL 105(H) 97 107(H)  BUN 8 - 23 mg/dL 26(H) 28(H) 19  Creatinine 0.61 - 1.24 mg/dL 3.61(H) 3.79(H) 3.11(H)  Sodium 135 - 145 mmol/L 134(L) 137 135  Potassium 3.5 - 5.1 mmol/L 3.6 3.6 3.7   Chloride 98 - 111 mmol/L 99 101 99  CO2 22 - 32 mmol/L _0 Calcium 8.9 - 10.3 mg/dL 9.1 8.4(L) 8.2(L)  Total Protein 6.5 - 8.1 g/dL 6.9 6.8 6.8  Total Bilirubin 0.3 - 1.2 mg/dL 0.4 0.4 0.6  Alkaline Phos 38 - 126 U/L 30(L) 30(L) 28(L)  AST 15 - 41 U/L _1 ALT 0 - 44 U/L 26 34 31    DIAGNOSTIC IMAGING:  I have independently reviewed the scans and discussed with the patient. ECHOCARDIOGRAM LIMITED  Result Date: 10/24/2021    ECHOCARDIOGRAM LIMITED REPORT   Patient Name:   Ernest Barnett Date of Exam: 10/24/2021 Medical Rec #:  [id number]      Height:       67.0 in Accession #:    [id number]     Weight:       251.4 lb Date of Birth:  1956-01-23      BSA:          2.229 m Patient Age:    37 years       BP:           116/72 mmHg Patient Gender: M  HR:           104 bpm. Exam Location:  [facility] Procedure: Limited Echo Indications:    R06.02 (ICD-10-CM) - SOB (shortness of breath)  History:        Patient has prior history of Echocardiogram examinations, most                 recent 07/10/2021. Stroke and COPD; Risk Factors:Hypertension and                 Dyslipidemia. Chronic kidney disease, stage V ([place]). Multiple                 myeloma ([place]).  Sonographer:    Alvino Chapel RCS Referring Phys: [id number] [place]  1. LImited study with Definity Apical windows foreshortened. Images are still difficult Mid/Distal inferior and apical hyokinesis. Overall LVEF is probably 50 to 55%.  2. Right ventricular systolic function is normal. The right ventricular size is normal. FINDINGS  Left Ventricle: LImited study with Definity Apical windows foreshortened. Images are still difficult Mid/Distal inferior and apical hyokinesis. Overall LVEF is probably 50 to 55%. Definity contrast agent was given IV to delineate the left ventricular endocardial borders. Right Ventricle: The right ventricular size is normal. Right vetricular wall thickness was not assessed. Right ventricular  systolic function is normal. Pericardium: There is no evidence of pericardial effusion. Dorris Carnes MD Electronically signed by Dorris Carnes MD Signature Date/Time: 10/24/2021/11:26:48 PM    Final      ASSESSMENT:  1.  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  Basis for CKD is hypertension associated arteriosclerosis with arterionephrosclerosis. - Bone marrow biopsy on 02/21/2021 with slightly hypercellular bone marrow with trilineage hematopoiesis.  Plasma cells increased in number representing 11% of all cells with kappa light chain restriction. - Myeloma FISH panel with no evidence of T p53.  Plasma cell enrichment yielded limited cellularity, therefore only tested for T p53 probes at. - Chromosome analysis 45, X,- Y (8)/46, XY (12) - Labs on 02/01/2021-M spike 2 g, kappa light chain 794, free light chain ratio 49.97.  Beta-2 microglobulin 5.5, albumin 3.5.  LDH normal. - PET scan on 03/08/2021 with no evidence of active myeloma.  No soft tissue plasmacytoma. - 24-hour urine total protein 2.2 g on 03/13/2021.  Immunofixation positive for IgG kappa. - 4 cycles of CyBorD from 03/14/2021 through 07/04/2021 held due to fluid retention.  Myeloma labs on 08/08/2021 showed progression. - Daratumumab, Velcade and dexamethasone started on 08/30/2021.   2.  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  Smoked 2 packs/day for 32 years and quit smoking cigarettes and vaping now. -Mother had small cell lung cancer.   3.  CKD: -He has stage V CKD thought to be secondary to hypertension. -Renal ultrasound on 12/19/2020 was negative for obstructive uropathy and findings were consistent with chronic medical renal disease.  Bilateral kidney cysts were seen. -He has 2.7 g of proteinuria on 24-hour urine. -Kidney biopsy was done on 01/25/2021.   PLAN:   1.  Stage II, standard risk IgG kappa multiple myeloma: - Daratumumab, Velcade and dexamethasone started on 08/30/2021. - Continue dexamethasone 10 mg weekly.  He receives Dara and Velcade on day 1 and Velcade on day 8 every 21 days. - He took premeds at home today. - Reviewed myeloma labs from 10/24/2021 showed continued  decrease in M spike 0.4 g.  Free light chain ratio also improved to 4.85, kappa light chain 62. - CBC today shows mild macrocytic anemia.  Normal LFTs.  LDH was minimally elevated at 2 8. - Proceed with next treatment today.  Myeloma labs will be repeated in 3 weeks and RTC 4 weeks.   2.  Smoking history: - CT chest on 02/15/2021 was lung RADS category 1.   3.  ID prophylaxis: - Continue aspirin for thromboprophylaxis.  Continue acyclovir twice daily.   4.  Myeloma bone disease: - Last denosumab on 09/13/2021.  5.  Lower extremity swelling: - Continue torsemide 100 mg twice daily.  6.  Peripheral neuropathy: - Continue gabapentin which is helping.  7.  Low back pain: - Continue oxycodone along with Tylenol as needed.  8.  ESRD on HD: -Hemodialysis started on 08/22/2021, continues on Monday, Wednesday and Friday. - Reports cramping after dialysis, last 2 sessions.   Orders placed this encounter:  No orders of the defined types were placed in this encounter.    Derek Jack, MD [place] [phone]   I, Thana Ates, am acting as a scribe for Dr. Derek Jack.  I, Derek Jack MD, have reviewed the above documentation for accuracy and completeness, and I agree with the above.

## 2021-10-31 NOTE — Telephone Encounter (Signed)
Stress test & OV were scheduled on same day for 11/13/2021.  Moved office visit out to 12/11/2021 with Estella Husk, PA in [place] office.

## 2021-11-01 ENCOUNTER — Inpatient Hospital Stay (HOSPITAL_COMMUNITY): Payer: Medicare HMO | Admitting: Hematology

## 2021-11-01 ENCOUNTER — Inpatient Hospital Stay (HOSPITAL_COMMUNITY): Payer: Medicare HMO

## 2021-11-01 ENCOUNTER — Other Ambulatory Visit: Payer: Self-pay | Admitting: Nurse Practitioner

## 2021-11-01 ENCOUNTER — Other Ambulatory Visit: Payer: Self-pay

## 2021-11-01 VITALS — BP 98/72 | HR 103 | Temp 98.2°F | Resp 19 | Ht 66.0 in | Wt 251.2 lb

## 2021-11-01 DIAGNOSIS — N2581 Secondary hyperparathyroidism of renal origin: Secondary | ICD-10-CM | POA: Insufficient documentation

## 2021-11-01 DIAGNOSIS — Z5112 Encounter for antineoplastic immunotherapy: Secondary | ICD-10-CM | POA: Diagnosis not present

## 2021-11-01 DIAGNOSIS — C9000 Multiple myeloma not having achieved remission: Secondary | ICD-10-CM

## 2021-11-01 DIAGNOSIS — D472 Monoclonal gammopathy: Secondary | ICD-10-CM

## 2021-11-01 DIAGNOSIS — I129 Hypertensive chronic kidney disease with stage 1 through stage 4 chronic kidney disease, or unspecified chronic kidney disease: Secondary | ICD-10-CM | POA: Insufficient documentation

## 2021-11-01 DIAGNOSIS — M545 Low back pain, unspecified: Secondary | ICD-10-CM

## 2021-11-01 DIAGNOSIS — D849 Immunodeficiency, unspecified: Secondary | ICD-10-CM | POA: Insufficient documentation

## 2021-11-01 LAB — COMPREHENSIVE METABOLIC PANEL
ALT: 26 U/L (ref 0–44)
AST: 15 U/L (ref 15–41)
Albumin: 3.7 g/dL (ref 3.5–5.0)
Alkaline Phosphatase: 30 U/L — ABNORMAL LOW (ref 38–126)
Anion gap: 10 (ref 5–15)
BUN: 26 mg/dL — ABNORMAL HIGH (ref 8–23)
CO2: 25 mmol/L (ref 22–32)
Calcium: 9.1 mg/dL (ref 8.9–10.3)
Chloride: 99 mmol/L (ref 98–111)
Creatinine, Ser: 3.61 mg/dL — ABNORMAL HIGH (ref 0.61–1.24)
GFR, Estimated: 18 mL/min — ABNORMAL LOW (ref >60)
Glucose, Bld: 105 mg/dL — ABNORMAL HIGH (ref 70–99)
Potassium: 3.6 mmol/L (ref 3.5–5.1)
Sodium: 134 mmol/L — ABNORMAL LOW (ref 135–145)
Total Bilirubin: 0.4 mg/dL (ref 0.3–1.2)
Total Protein: 6.9 g/dL (ref 6.5–8.1)

## 2021-11-01 LAB — LACTATE DEHYDROGENASE: LDH: 208 U/L — ABNORMAL HIGH (ref 98–192)

## 2021-11-01 LAB — CBC WITH DIFFERENTIAL/PLATELET
Abs Immature Granulocytes: 0.02 10*3/uL (ref 0.00–0.07)
Basophils Absolute: 0.1 10*3/uL (ref 0.0–0.1)
Basophils Relative: 1 %
Eosinophils Absolute: 0.1 10*3/uL (ref 0.0–0.5)
Eosinophils Relative: 2 %
HCT: 31.7 % — ABNORMAL LOW (ref 39.0–52.0)
Hemoglobin: 10.8 g/dL — ABNORMAL LOW (ref 13.0–17.0)
Immature Granulocytes: 0 %
Lymphocytes Relative: 48 %
Lymphs Abs: 4.1 10*3/uL — ABNORMAL HIGH (ref 0.7–4.0)
MCH: 33.1 pg (ref 26.0–34.0)
MCHC: 34.1 g/dL (ref 30.0–36.0)
MCV: 97.2 fL (ref 80.0–100.0)
Monocytes Absolute: 1.1 10*3/uL — ABNORMAL HIGH (ref 0.1–1.0)
Monocytes Relative: 13 %
Neutro Abs: 3 10*3/uL (ref 1.7–7.7)
Neutrophils Relative %: 36 %
Platelets: 199 10*3/uL (ref 150–400)
RBC: 3.26 MIL/uL — ABNORMAL LOW (ref 4.22–5.81)
RDW: 14.6 % (ref 11.5–15.5)
WBC: 8.4 10*3/uL (ref 4.0–10.5)
nRBC: 0 % (ref 0.0–0.2)

## 2021-11-01 MED ORDER — BORTEZOMIB CHEMO SQ INJECTION 3.5 MG (2.5MG/ML)
1.0000 mg/m2 | Freq: Once | INTRAMUSCULAR | Status: AC
  Administered 2021-11-01: 13:00:00 2.25 mg via SUBCUTANEOUS
  Filled 2021-11-01: qty 0.9

## 2021-11-01 MED ORDER — DARATUMUMAB-HYALURONIDASE-FIHJ 1800-30000 MG-UT/15ML ~~LOC~~ SOLN
1800.0000 mg | Freq: Once | SUBCUTANEOUS | Status: AC
  Administered 2021-11-01: 13:00:00 1800 mg via SUBCUTANEOUS
  Filled 2021-11-01: qty 15

## 2021-11-01 NOTE — Progress Notes (Signed)
Patient presents today for chemotherapy injection.  Patient is in satisfactory condition with no complaints voiced.  Vital signs are stable.  Labs reviewed by Dr. Delton Coombes during his office visit.  All labs are within treatment parameters.  Pre-medications were taken at home prior to office visit at 1000.  We will proceed with treatment per MD orders.   Patient tolerated Dara SQ and Velcade injections with no complaints voiced.  Site clean and dry with no bruising or swelling noted.  No complaints of pain.  Discharged with vital signs stable and no signs or symptoms of distress noted.

## 2021-11-01 NOTE — Progress Notes (Signed)
Patient has been examined by Dr. Katragadda, and vital signs and labs have been reviewed. ANC, Creatinine, LFTs, hemoglobin, and platelets are within treatment parameters per M.D. - pt may proceed with treatment.    °

## 2021-11-01 NOTE — Patient Instructions (Signed)
[place] at [facility] Discharge Instructions   You were seen and examined today by Dr. Delton Coombes. He reviewed your lab work which is normal.  Myeloma labs have improved. Return as scheduled for lab work, injections and office visit.    Thank you for choosing [place] at [facility] to provide your oncology and hematology care.  To afford each patient quality time with our provider, please arrive at least 15 minutes before your scheduled appointment time.   If you have a lab appointment with the [facility] please come in thru the Main Entrance and check in at the main information desk.  You need to re-schedule your appointment should you arrive 10 or more minutes late.  We strive to give you quality time with our providers, and arriving late affects you and other patients whose appointments are after yours.  Also, if you no show three or more times for appointments you may be dismissed from the clinic at the providers discretion.     Again, thank you for choosing [facility].  Our hope is that these requests will decrease the amount of time that you wait before being seen by our physicians.       _____________________________________________________________  Should you have questions after your visit to [facility], please contact our office at [phone] and follow the prompts.  Our office hours are 8:00 a.m. and 4:30 p.m. Monday - Friday.  Please note that voicemails left after 4:00 p.m. may not be returned until the following business day.  We are closed weekends and major holidays.  You do have access to a nurse 24-7, just call the main number to the clinic [phone] and do not press any options, hold on the line and a nurse will answer the phone.    For prescription refill requests, have your pharmacy contact our office and allow 72 hours.    Due to Covid, you will need to wear a mask upon entering the  hospital. If you do not have a mask, a mask will be given to you at the Main Entrance upon arrival. For doctor visits, patients may have 1 support person age 10 or older with them. For treatment visits, patients can not have anyone with them due to social distancing guidelines and our immunocompromised population.

## 2021-11-01 NOTE — Patient Instructions (Signed)
[facility]  Discharge Instructions: Thank you for choosing [facility] to provide your oncology and hematology care.  If you have a lab appointment with the Cancer Center, please come in thru the Main Entrance and check in at the main information desk.  Wear comfortable clothing and clothing appropriate for easy access to any Portacath or PICC line.   We strive to give you quality time with your provider. You may need to reschedule your appointment if you arrive late (15 or more minutes).  Arriving late affects you and other patients whose appointments are after yours.  Also, if you miss three or more appointments without notifying the office, you may be dismissed from the clinic at the provider's discretion.      For prescription refill requests, have your pharmacy contact our office and allow 72 hours for refills to be completed.        To help prevent nausea and vomiting after your treatment, we encourage you to take your nausea medication as directed.  BELOW ARE SYMPTOMS THAT SHOULD BE REPORTED IMMEDIATELY: *FEVER GREATER THAN 100.4 F (38 C) OR HIGHER *CHILLS OR SWEATING *NAUSEA AND VOMITING THAT IS NOT CONTROLLED WITH YOUR NAUSEA MEDICATION *UNUSUAL SHORTNESS OF BREATH *UNUSUAL BRUISING OR BLEEDING *URINARY PROBLEMS (pain or burning when urinating, or frequent urination) *BOWEL PROBLEMS (unusual diarrhea, constipation, pain near the anus) TENDERNESS IN MOUTH AND THROAT WITH OR WITHOUT PRESENCE OF ULCERS (sore throat, sores in mouth, or a toothache) UNUSUAL RASH, SWELLING OR PAIN  UNUSUAL VAGINAL DISCHARGE OR ITCHING   Items with * indicate a potential emergency and should be followed up as soon as possible or go to the Emergency Department if any problems should occur.  Please show the CHEMOTHERAPY ALERT CARD or IMMUNOTHERAPY ALERT CARD at check-in to the Emergency Department and triage nurse.  Should you have questions after your visit or need to cancel  or reschedule your appointment, please contact [facility] [phone]  and follow the prompts.  Office hours are 8:00 a.m. to 4:30 p.m. Monday - Friday. Please note that voicemails left after 4:00 p.m. may not be returned until the following business day.  We are closed weekends and major holidays. You have access to a nurse at all times for urgent questions. Please call the main number to the clinic [phone] and follow the prompts.  For any non-urgent questions, you may also contact your provider using MyChart. We now offer e-Visits for anyone 18 and older to request care online for non-urgent symptoms. For details visit [email or web address].   Also download the MyChart app! Go to the app store, search "MyChart", open the app, select [facility], and log in with your MyChart username and password.  Due to Covid, a mask is required upon entering the hospital/clinic. If you do not have a mask, one will be given to you upon arrival. For doctor visits, patients may have 1 support person aged 18 or older with them. For treatment visits, patients cannot have anyone with them due to current Covid guidelines and our immunocompromised population.  

## 2021-11-02 DIAGNOSIS — N186 End stage renal disease: Secondary | ICD-10-CM | POA: Diagnosis not present

## 2021-11-02 DIAGNOSIS — Z992 Dependence on renal dialysis: Secondary | ICD-10-CM | POA: Diagnosis not present

## 2021-11-03 DIAGNOSIS — N186 End stage renal disease: Secondary | ICD-10-CM | POA: Diagnosis not present

## 2021-11-03 DIAGNOSIS — Z992 Dependence on renal dialysis: Secondary | ICD-10-CM | POA: Diagnosis not present

## 2021-11-06 DIAGNOSIS — I120 Hypertensive chronic kidney disease with stage 5 chronic kidney disease or end stage renal disease: Secondary | ICD-10-CM | POA: Diagnosis not present

## 2021-11-06 DIAGNOSIS — N2581 Secondary hyperparathyroidism of renal origin: Secondary | ICD-10-CM | POA: Diagnosis not present

## 2021-11-06 DIAGNOSIS — C9000 Multiple myeloma not having achieved remission: Secondary | ICD-10-CM | POA: Diagnosis not present

## 2021-11-06 DIAGNOSIS — N186 End stage renal disease: Secondary | ICD-10-CM | POA: Diagnosis not present

## 2021-11-06 DIAGNOSIS — F17200 Nicotine dependence, unspecified, uncomplicated: Secondary | ICD-10-CM | POA: Insufficient documentation

## 2021-11-06 DIAGNOSIS — D8481 Immunodeficiency due to conditions classified elsewhere: Secondary | ICD-10-CM | POA: Diagnosis not present

## 2021-11-06 DIAGNOSIS — Z992 Dependence on renal dialysis: Secondary | ICD-10-CM | POA: Diagnosis not present

## 2021-11-06 DIAGNOSIS — Z9181 History of falling: Secondary | ICD-10-CM | POA: Insufficient documentation

## 2021-11-06 DIAGNOSIS — J449 Chronic obstructive pulmonary disease, unspecified: Secondary | ICD-10-CM | POA: Diagnosis not present

## 2021-11-07 ENCOUNTER — Other Ambulatory Visit (HOSPITAL_COMMUNITY): Payer: Self-pay

## 2021-11-07 DIAGNOSIS — Z992 Dependence on renal dialysis: Secondary | ICD-10-CM | POA: Diagnosis not present

## 2021-11-07 DIAGNOSIS — N186 End stage renal disease: Secondary | ICD-10-CM | POA: Diagnosis not present

## 2021-11-07 DIAGNOSIS — D472 Monoclonal gammopathy: Secondary | ICD-10-CM

## 2021-11-07 DIAGNOSIS — C9000 Multiple myeloma not having achieved remission: Secondary | ICD-10-CM

## 2021-11-08 ENCOUNTER — Other Ambulatory Visit: Payer: Self-pay

## 2021-11-08 ENCOUNTER — Inpatient Hospital Stay (HOSPITAL_COMMUNITY): Payer: Medicare HMO | Attending: Hematology

## 2021-11-08 ENCOUNTER — Encounter (HOSPITAL_COMMUNITY): Payer: Self-pay

## 2021-11-08 ENCOUNTER — Inpatient Hospital Stay (HOSPITAL_COMMUNITY): Payer: Medicare HMO

## 2021-11-08 VITALS — BP 102/69 | HR 98 | Temp 98.3°F | Resp 20 | Ht 66.0 in | Wt 257.5 lb

## 2021-11-08 DIAGNOSIS — C9000 Multiple myeloma not having achieved remission: Secondary | ICD-10-CM | POA: Diagnosis present

## 2021-11-08 DIAGNOSIS — Z5112 Encounter for antineoplastic immunotherapy: Secondary | ICD-10-CM | POA: Diagnosis not present

## 2021-11-08 DIAGNOSIS — D472 Monoclonal gammopathy: Secondary | ICD-10-CM

## 2021-11-08 LAB — CBC WITH DIFFERENTIAL/PLATELET
Abs Immature Granulocytes: 0.03 10*3/uL (ref 0.00–0.07)
Basophils Absolute: 0 10*3/uL (ref 0.0–0.1)
Basophils Relative: 1 %
Eosinophils Absolute: 0.1 10*3/uL (ref 0.0–0.5)
Eosinophils Relative: 1 %
HCT: 30.9 % — ABNORMAL LOW (ref 39.0–52.0)
Hemoglobin: 10 g/dL — ABNORMAL LOW (ref 13.0–17.0)
Immature Granulocytes: 0 %
Lymphocytes Relative: 47 %
Lymphs Abs: 4 10*3/uL (ref 0.7–4.0)
MCH: 32.6 pg (ref 26.0–34.0)
MCHC: 32.4 g/dL (ref 30.0–36.0)
MCV: 100.7 fL — ABNORMAL HIGH (ref 80.0–100.0)
Monocytes Absolute: 1 10*3/uL (ref 0.1–1.0)
Monocytes Relative: 12 %
Neutro Abs: 3.2 10*3/uL (ref 1.7–7.7)
Neutrophils Relative %: 39 %
Platelets: 207 10*3/uL (ref 150–400)
RBC: 3.07 MIL/uL — ABNORMAL LOW (ref 4.22–5.81)
RDW: 15.5 % (ref 11.5–15.5)
WBC: 8.4 10*3/uL (ref 4.0–10.5)
nRBC: 0 % (ref 0.0–0.2)

## 2021-11-08 LAB — COMPREHENSIVE METABOLIC PANEL
ALT: 21 U/L (ref 0–44)
AST: 17 U/L (ref 15–41)
Albumin: 3.5 g/dL (ref 3.5–5.0)
Alkaline Phosphatase: 28 U/L — ABNORMAL LOW (ref 38–126)
Anion gap: 8 (ref 5–15)
BUN: 25 mg/dL — ABNORMAL HIGH (ref 8–23)
CO2: 25 mmol/L (ref 22–32)
Calcium: 7.9 mg/dL — ABNORMAL LOW (ref 8.9–10.3)
Chloride: 105 mmol/L (ref 98–111)
Creatinine, Ser: 3.52 mg/dL — ABNORMAL HIGH (ref 0.61–1.24)
GFR, Estimated: 19 mL/min — ABNORMAL LOW (ref >60)
Glucose, Bld: 119 mg/dL — ABNORMAL HIGH (ref 70–99)
Potassium: 3.8 mmol/L (ref 3.5–5.1)
Sodium: 138 mmol/L (ref 135–145)
Total Bilirubin: 0.5 mg/dL (ref 0.3–1.2)
Total Protein: 6.6 g/dL (ref 6.5–8.1)

## 2021-11-08 LAB — MAGNESIUM: Magnesium: 2.2 mg/dL (ref 1.7–2.4)

## 2021-11-08 MED ORDER — BORTEZOMIB CHEMO SQ INJECTION 3.5 MG (2.5MG/ML)
1.0000 mg/m2 | Freq: Once | INTRAMUSCULAR | Status: AC
  Administered 2021-11-08: 2.25 mg via SUBCUTANEOUS
  Filled 2021-11-08: qty 0.9

## 2021-11-08 MED ORDER — DEXAMETHASONE 4 MG PO TABS: 20.0000 mg | ORAL_TABLET | Freq: Once | ORAL | Status: DC

## 2021-11-08 NOTE — Progress Notes (Signed)
Patient presents today for treatment. Vital signs within parameters for treatment. Labs within parameters for treatment. Tx plan states to ignore creatinine for treatment. Dialysis patient. Patient has no complaints of any significant changes. Patient states he took premedications prior to arrival and he does not need the decadron today.   Per Dr. Delton Coombes hold Delton See due to patient on dialysis . Verbal order.   Velcade given today per MD orders. Tolerated without adverse affects. Vital signs stable. No complaints at this time. Discharged from clinic ambulatory in stable condition. Alert and oriented x 3. F/U with [facility] as scheduled.

## 2021-11-08 NOTE — Patient Instructions (Signed)
[facility]  Discharge Instructions: Thank you for choosing [facility] to provide your oncology and hematology care.  If you have a lab appointment with the [facility], please come in thru the Main Entrance and check in at the main information desk.  Wear comfortable clothing and clothing appropriate for easy access to any Portacath or PICC line.   We strive to give you quality time with your provider. You may need to reschedule your appointment if you arrive late (15 or more minutes).  Arriving late affects you and other patients whose appointments are after yours.  Also, if you miss three or more appointments without notifying the office, you may be dismissed from the clinic at the providers discretion.      For prescription refill requests, have your pharmacy contact our office and allow 72 hours for refills to be completed.    Today you received the following chemotherapy and/or immunotherapy agents Velcade injection.       To help prevent nausea and vomiting after your treatment, we encourage you to take your nausea medication as directed.  BELOW ARE SYMPTOMS THAT SHOULD BE REPORTED IMMEDIATELY: *FEVER GREATER THAN 100.4 F (38 C) OR HIGHER *CHILLS OR SWEATING *NAUSEA AND VOMITING THAT IS NOT CONTROLLED WITH YOUR NAUSEA MEDICATION *UNUSUAL SHORTNESS OF BREATH *UNUSUAL BRUISING OR BLEEDING *URINARY PROBLEMS (pain or burning when urinating, or frequent urination) *BOWEL PROBLEMS (unusual diarrhea, constipation, pain near the anus) TENDERNESS IN MOUTH AND THROAT WITH OR WITHOUT PRESENCE OF ULCERS (sore throat, sores in mouth, or a toothache) UNUSUAL RASH, SWELLING OR PAIN  UNUSUAL VAGINAL DISCHARGE OR ITCHING   Items with * indicate a potential emergency and should be followed up as soon as possible or go to the Emergency Department if any problems should occur.  Please show the CHEMOTHERAPY ALERT CARD or IMMUNOTHERAPY ALERT CARD at check-in to the  Emergency Department and triage nurse.  Should you have questions after your visit or need to cancel or reschedule your appointment, please contact [facility] [phone]  and follow the prompts.  Office hours are 8:00 a.m. to 4:30 p.m. Monday - Friday. Please note that voicemails left after 4:00 p.m. may not be returned until the following business day.  We are closed weekends and major holidays. You have access to a nurse at all times for urgent questions. Please call the main number to the clinic [phone] and follow the prompts.  For any non-urgent questions, you may also contact your provider using MyChart. We now offer e-Visits for anyone 97 and older to request care online for non-urgent symptoms. For details visit [email or web address].   Also download the MyChart app! Go to the app store, search "MyChart", open the app, select [place], and log in with your MyChart username and password.  Due to Covid, a mask is required upon entering the hospital/clinic. If you do not have a mask, one will be given to you upon arrival. For doctor visits, patients may have 1 support person aged 23 or older with them. For treatment visits, patients cannot have anyone with them due to current Covid guidelines and our immunocompromised population.

## 2021-11-09 DIAGNOSIS — Z992 Dependence on renal dialysis: Secondary | ICD-10-CM | POA: Diagnosis not present

## 2021-11-09 DIAGNOSIS — N186 End stage renal disease: Secondary | ICD-10-CM | POA: Diagnosis not present

## 2021-11-12 DIAGNOSIS — Z992 Dependence on renal dialysis: Secondary | ICD-10-CM | POA: Diagnosis not present

## 2021-11-12 DIAGNOSIS — N186 End stage renal disease: Secondary | ICD-10-CM | POA: Diagnosis not present

## 2021-11-13 ENCOUNTER — Ambulatory Visit: Payer: Medicare HMO | Admitting: Cardiology

## 2021-11-13 ENCOUNTER — Ambulatory Visit (HOSPITAL_COMMUNITY)
Admission: RE | Admit: 2021-11-13 | Discharge: 2021-11-13 | Disposition: A | Discharge status: Home / Self Care | Payer: Medicare HMO | Source: Ambulatory Visit | Attending: Cardiology | Admitting: Cardiology

## 2021-11-13 ENCOUNTER — Encounter (HOSPITAL_COMMUNITY): Payer: Self-pay

## 2021-11-13 ENCOUNTER — Encounter (HOSPITAL_COMMUNITY)
Admission: RE | Admit: 2021-11-13 | Discharge: 2021-11-13 | Disposition: A | Discharge status: Home / Self Care | Payer: Medicare HMO | Source: Ambulatory Visit | Attending: Cardiology | Admitting: Cardiology

## 2021-11-13 ENCOUNTER — Other Ambulatory Visit: Payer: Self-pay

## 2021-11-13 DIAGNOSIS — R0602 Shortness of breath: Secondary | ICD-10-CM | POA: Insufficient documentation

## 2021-11-13 LAB — NM MYOCAR MULTI W/SPECT W/WALL MOTION / EF
LV dias vol: 93 mL (ref 62–150)
LV sys vol: 37 mL
Nuc Stress EF: 60 %
Peak HR: 127 {beats}/min
RATE: 0.5
Rest HR: 90 {beats}/min
Rest Nuclear Isotope Dose: 11 mCi
SDS: 13
SRS: 0
SSS: 13
ST Depression (mm): 0 mm
Stress Nuclear Isotope Dose: 30 mCi
TID: 0.83

## 2021-11-13 MED ORDER — TECHNETIUM TC 99M TETROFOSMIN IV KIT
10.0000 | PACK | Freq: Once | INTRAVENOUS | Status: AC | PRN
  Administered 2021-11-13: 11 via INTRAVENOUS

## 2021-11-13 MED ORDER — REGADENOSON 0.4 MG/5ML IV SOLN
INTRAVENOUS | Status: AC
  Administered 2021-11-13: 0.4 mg via INTRAVENOUS
  Filled 2021-11-13: qty 5

## 2021-11-13 MED ORDER — SODIUM CHLORIDE FLUSH 0.9 % IV SOLN
INTRAVENOUS | Status: AC
  Administered 2021-11-13: 10 mL via INTRAVENOUS
  Filled 2021-11-13: qty 10

## 2021-11-13 MED ORDER — TECHNETIUM TC 99M TETROFOSMIN IV KIT
30.0000 | PACK | Freq: Once | INTRAVENOUS | Status: AC | PRN
  Administered 2021-11-13: 30 via INTRAVENOUS

## 2021-11-14 ENCOUNTER — Telehealth: Payer: Self-pay

## 2021-11-14 DIAGNOSIS — N186 End stage renal disease: Secondary | ICD-10-CM | POA: Diagnosis not present

## 2021-11-14 DIAGNOSIS — Z992 Dependence on renal dialysis: Secondary | ICD-10-CM | POA: Diagnosis not present

## 2021-11-14 MED ORDER — RANOLAZINE ER 500 MG PO TB12: 500.0000 mg | ORAL_TABLET | Freq: Every day | ORAL | 3 refills | Status: DC

## 2021-11-14 NOTE — Telephone Encounter (Signed)
-----   Message from Arnoldo Lenis, MD sent at 11/14/2021  3:46 PM EST ----- Stress test shows a small area that may represent a blockage. The area is small enough it would be considered low risk and something to treat with medications. Can he start ranexa 500mg  once daily and f/u 2 months  Zandra Abts MD

## 2021-11-14 NOTE — Telephone Encounter (Signed)
Pt's S/O notified and verbalized understanding. Pt has appt with [facility] in March.

## 2021-11-16 ENCOUNTER — Inpatient Hospital Stay (HOSPITAL_COMMUNITY): Payer: Medicare HMO

## 2021-11-16 DIAGNOSIS — Z992 Dependence on renal dialysis: Secondary | ICD-10-CM | POA: Diagnosis not present

## 2021-11-16 DIAGNOSIS — N186 End stage renal disease: Secondary | ICD-10-CM | POA: Diagnosis not present

## 2021-11-19 ENCOUNTER — Inpatient Hospital Stay (HOSPITAL_COMMUNITY): Payer: Medicare HMO | Attending: Hematology

## 2021-11-19 ENCOUNTER — Other Ambulatory Visit: Payer: Self-pay

## 2021-11-19 DIAGNOSIS — N186 End stage renal disease: Secondary | ICD-10-CM | POA: Diagnosis not present

## 2021-11-19 DIAGNOSIS — C9000 Multiple myeloma not having achieved remission: Secondary | ICD-10-CM | POA: Insufficient documentation

## 2021-11-19 DIAGNOSIS — N2581 Secondary hyperparathyroidism of renal origin: Secondary | ICD-10-CM | POA: Diagnosis not present

## 2021-11-19 DIAGNOSIS — Z992 Dependence on renal dialysis: Secondary | ICD-10-CM | POA: Diagnosis not present

## 2021-11-19 LAB — COMPREHENSIVE METABOLIC PANEL
ALT: 22 U/L (ref 0–44)
AST: 14 U/L — ABNORMAL LOW (ref 15–41)
Albumin: 4.2 g/dL (ref 3.5–5.0)
Alkaline Phosphatase: 29 U/L — ABNORMAL LOW (ref 38–126)
Anion gap: 10 (ref 5–15)
BUN: 24 mg/dL — ABNORMAL HIGH (ref 8–23)
CO2: 26 mmol/L (ref 22–32)
Calcium: 8.6 mg/dL — ABNORMAL LOW (ref 8.9–10.3)
Chloride: 97 mmol/L — ABNORMAL LOW (ref 98–111)
Creatinine, Ser: 3.28 mg/dL — ABNORMAL HIGH (ref 0.61–1.24)
GFR, Estimated: 20 mL/min — ABNORMAL LOW (ref >60)
Glucose, Bld: 106 mg/dL — ABNORMAL HIGH (ref 70–99)
Potassium: 3.7 mmol/L (ref 3.5–5.1)
Sodium: 133 mmol/L — ABNORMAL LOW (ref 135–145)
Total Bilirubin: 0.3 mg/dL (ref 0.3–1.2)
Total Protein: 7.7 g/dL (ref 6.5–8.1)

## 2021-11-19 LAB — CBC WITH DIFFERENTIAL/PLATELET
Abs Immature Granulocytes: 0.02 10*3/uL (ref 0.00–0.07)
Basophils Absolute: 0.1 10*3/uL (ref 0.0–0.1)
Basophils Relative: 1 %
Eosinophils Absolute: 0.1 10*3/uL (ref 0.0–0.5)
Eosinophils Relative: 1 %
HCT: 34.5 % — ABNORMAL LOW (ref 39.0–52.0)
Hemoglobin: 11.6 g/dL — ABNORMAL LOW (ref 13.0–17.0)
Immature Granulocytes: 0 %
Lymphocytes Relative: 43 %
Lymphs Abs: 4 10*3/uL (ref 0.7–4.0)
MCH: 32.9 pg (ref 26.0–34.0)
MCHC: 33.6 g/dL (ref 30.0–36.0)
MCV: 97.7 fL (ref 80.0–100.0)
Monocytes Absolute: 1 10*3/uL (ref 0.1–1.0)
Monocytes Relative: 11 %
Neutro Abs: 3.9 10*3/uL (ref 1.7–7.7)
Neutrophils Relative %: 44 %
Platelets: 225 10*3/uL (ref 150–400)
RBC: 3.53 MIL/uL — ABNORMAL LOW (ref 4.22–5.81)
RDW: 14.9 % (ref 11.5–15.5)
WBC: 9.1 10*3/uL (ref 4.0–10.5)
nRBC: 0 % (ref 0.0–0.2)

## 2021-11-19 LAB — LACTATE DEHYDROGENASE: LDH: 230 U/L — ABNORMAL HIGH (ref 98–192)

## 2021-11-20 LAB — PROTEIN ELECTROPHORESIS, SERUM
A/G Ratio: 1.2 (ref 0.7–1.7)
Albumin ELP: 4 g/dL (ref 2.9–4.4)
Alpha-1-Globulin: 0.3 g/dL (ref 0.0–0.4)
Alpha-2-Globulin: 1.1 g/dL — ABNORMAL HIGH (ref 0.4–1.0)
Beta Globulin: 1 g/dL (ref 0.7–1.3)
Gamma Globulin: 0.9 g/dL (ref 0.4–1.8)
Globulin, Total: 3.3 g/dL (ref 2.2–3.9)
M-Spike, %: 0.4 g/dL — ABNORMAL HIGH
Total Protein ELP: 7.3 g/dL (ref 6.0–8.5)

## 2021-11-20 LAB — KAPPA/LAMBDA LIGHT CHAINS
Kappa free light chain: 48.7 mg/L — ABNORMAL HIGH (ref 3.3–19.4)
Kappa, lambda light chain ratio: 4.39 — ABNORMAL HIGH (ref 0.26–1.65)
Lambda free light chains: 11.1 mg/L (ref 5.7–26.3)

## 2021-11-21 DIAGNOSIS — N186 End stage renal disease: Secondary | ICD-10-CM | POA: Diagnosis not present

## 2021-11-21 DIAGNOSIS — Z992 Dependence on renal dialysis: Secondary | ICD-10-CM | POA: Diagnosis not present

## 2021-11-22 ENCOUNTER — Other Ambulatory Visit: Payer: Self-pay

## 2021-11-22 ENCOUNTER — Inpatient Hospital Stay (HOSPITAL_BASED_OUTPATIENT_CLINIC_OR_DEPARTMENT_OTHER): Payer: Medicare HMO | Admitting: Hematology

## 2021-11-22 ENCOUNTER — Other Ambulatory Visit (HOSPITAL_COMMUNITY): Payer: Medicare HMO

## 2021-11-22 ENCOUNTER — Inpatient Hospital Stay (HOSPITAL_COMMUNITY): Payer: Medicare HMO

## 2021-11-22 VITALS — BP 110/72 | HR 108 | Temp 98.0°F | Resp 20 | Ht 66.0 in | Wt 256.6 lb

## 2021-11-22 DIAGNOSIS — C9000 Multiple myeloma not having achieved remission: Secondary | ICD-10-CM

## 2021-11-22 DIAGNOSIS — Z5112 Encounter for antineoplastic immunotherapy: Secondary | ICD-10-CM | POA: Diagnosis not present

## 2021-11-22 MED ORDER — DARATUMUMAB-HYALURONIDASE-FIHJ 1800-30000 MG-UT/15ML ~~LOC~~ SOLN
1800.0000 mg | Freq: Once | SUBCUTANEOUS | Status: AC
  Administered 2021-11-22: 1800 mg via SUBCUTANEOUS
  Filled 2021-11-22: qty 15

## 2021-11-22 MED ORDER — BORTEZOMIB CHEMO SQ INJECTION 3.5 MG (2.5MG/ML)
1.0000 mg/m2 | Freq: Once | INTRAMUSCULAR | Status: AC
  Administered 2021-11-22: 2.25 mg via SUBCUTANEOUS
  Filled 2021-11-22: qty 0.9

## 2021-11-22 NOTE — Patient Instructions (Signed)
[place] at [facility] Discharge Instructions   You were seen and examined today by Dr. Delton Coombes.  He reviewed your lab work today which is normal/stable/improving.  We will proceed with your treatment today.   Return as scheduled for lab work and treatment.    Thank you for choosing [place] at [facility] to provide your oncology and hematology care.  To afford each patient quality time with our provider, please arrive at least 15 minutes before your scheduled appointment time.   If you have a lab appointment with the [facility] please come in thru the Main Entrance and check in at the main information desk.  You need to re-schedule your appointment should you arrive 10 or more minutes late.  We strive to give you quality time with our providers, and arriving late affects you and other patients whose appointments are after yours.  Also, if you no show three or more times for appointments you may be dismissed from the clinic at the providers discretion.     Again, thank you for choosing [facility].  Our hope is that these requests will decrease the amount of time that you wait before being seen by our physicians.       _____________________________________________________________  Should you have questions after your visit to [facility], please contact our office at [phone] and follow the prompts.  Our office hours are 8:00 a.m. and 4:30 p.m. Monday - Friday.  Please note that voicemails left after 4:00 p.m. may not be returned until the following business day.  We are closed weekends and major holidays.  You do have access to a nurse 24-7, just call the main number to the clinic [phone] and do not press any options, hold on the line and a nurse will answer the phone.    For prescription refill requests, have your pharmacy contact our office and allow 72 hours.    Due to Covid, you will need to  wear a mask upon entering the hospital. If you do not have a mask, a mask will be given to you at the Main Entrance upon arrival. For doctor visits, patients may have 1 support person age 48 or older with them. For treatment visits, patients can not have anyone with them due to social distancing guidelines and our immunocompromised population.

## 2021-11-22 NOTE — Progress Notes (Signed)
[facility] [place], [place] [address]   CLINIC:  Medical Oncology/Hematology  PCP:  Lindell Spar, MD [address] / [place] [address] [phone]   REASON FOR VISIT:  Follow-up for multiple myeloma  PRIOR THERAPY: CyBorD  NGS Results: not done  CURRENT THERAPY: Velcade, daratumumab and dexamethasone  BRIEF ONCOLOGIC HISTORY:  Oncology History  Multiple myeloma ([facility])  03/06/2021 Initial Diagnosis   Multiple myeloma ([place])   03/06/2021 Cancer Staging   Staging form: Plasma Cell Myeloma and Plasma Cell Disorders, AJCC 8th Edition - Clinical stage from 03/06/2021: RISS Stage II (Beta-2-microglobulin (mg/L): 5.5, Albumin (g/dL): 3.5, ISS: Stage III, High-risk cytogenetics: Absent, LDH: Normal) - Signed by Derek Jack, MD on 03/06/2021 Stage prefix: Initial diagnosis Beta 2 microglobulin range (mg/L): Greater than or equal to 5.5 Albumin range (g/dL): Greater than or equal to 3.5 Cytogenetics: No abnormalities Pretreatment monoclonal protein level in serum (M spike) (g/dL): 2 Pretreatment serum free kappa light chain level (g/L): 794 Pretreatment serum free lambda light chain level (g/L): 15.9    03/14/2021 - 07/04/2021 Chemotherapy   Patient is on Treatment Plan : MULTIPLE MYELOMA CyBorD - Weekly Bortezomib     08/30/2021 -  Chemotherapy   Patient is on Treatment Plan : MYELOMA RELAPSED / REFRACTORY Daratumumab SQ + Bortezomib + Dexamethasone (DaraVd) q21d / Daratumumab SQ q28d        CANCER STAGING:  Cancer Staging  Multiple myeloma ([facility]) Staging form: Plasma Cell Myeloma and Plasma Cell Disorders, AJCC 8th Edition - Clinical stage from 03/06/2021: RISS Stage II (Beta-2-microglobulin (mg/L): 5.5, Albumin (g/dL): 3.5, ISS: Stage III, High-risk cytogenetics: Absent, LDH: Normal) - Signed by Derek Jack, MD on 03/06/2021   INTERVAL HISTORY:  [name], a 66 y.o. male, returns for routine follow-up and consideration  for next cycle of chemotherapy. [name] was last seen on 11/01/2021.  Due for cycle #5 of Velcade, daratumumab and dexamethasone today.   Overall, he tells me he has been feeling pretty well. He reports constipation. He denies tingling/numbness.   Overall, he feels ready for next cycle of chemo today.   REVIEW OF SYSTEMS:  Review of Systems  Constitutional:  Negative for appetite change and fatigue.  Respiratory:  Positive for shortness of breath.   Gastrointestinal:  Positive for constipation.  Musculoskeletal:  Positive for arthralgias (3/10 R hip).  Neurological:  Negative for numbness.  All other systems reviewed and are negative.  PAST MEDICAL/SURGICAL HISTORY:  Past Medical History:  Diagnosis Date   Chronic kidney disease    COPD (chronic obstructive pulmonary disease) (HCC)    CVA (cerebral vascular accident) ([facility]) 2013   Cystoid macular edema of left eye 03/07/2021   GERD (gastroesophageal reflux disease)    HOH (hard of hearing)    Hypercholesteremia    Hypertension    Left epiretinal membrane 03/07/2021   Pseudophakia 03/07/2021   Stroke ([facility]) 03/24/2012   left sided weakness   Vitamin D deficiency    Past Surgical History:  Procedure Laterality Date   AV FISTULA PLACEMENT Right 02/08/2021   Procedure: RIGHT ARM ARTERIOVENOUS (AV) FISTULA CREATION;  Surgeon: [name], MD;  Location: [facility];  Service: Vascular;  Laterality: Right;   BIOPSY  11/12/2018   Procedure: BIOPSY;  Surgeon: Rogene Houston, MD;  Location: [facility];  Service: Endoscopy;;  colon   BIOPSY  05/10/2020   Procedure: BIOPSY;  Surgeon: Rogene Houston, MD;  [facility];  Service: Endoscopy;;  esophagus   CATARACT EXTRACTION     right eye   CATARACT EXTRACTION W/PHACO  10/08/2012   Procedure: CATARACT EXTRACTION PHACO AND INTRAOCULAR LENS PLACEMENT (IOC);  Surgeon: Tonny Branch, MD;  Location: [facility];  Service: Ophthalmology;  Laterality: Left;  CDE:6.64   CHOLECYSTECTOMY     [place]    COLONOSCOPY N/A 11/12/2018   Procedure: COLONOSCOPY;  Surgeon: Rogene Houston, MD;  [facility]: [facility];  Service: Endoscopy;  Laterality: N/A;  1030   ELBOW FRACTURE SURGERY     left   ESOPHAGEAL DILATION N/A 11/12/2018   Procedure: ESOPHAGEAL DILATION;  Surgeon: Rogene Houston, MD;  [facility];  Service: Endoscopy;  Laterality: N/A;   ESOPHAGEAL DILATION N/A 05/10/2020   Procedure: ESOPHAGEAL DILATION;  Surgeon: Rogene Houston, MD;  [facility];  Service: Endoscopy;  Laterality: N/A;   ESOPHAGOGASTRODUODENOSCOPY N/A 11/12/2018   Procedure: ESOPHAGOGASTRODUODENOSCOPY (EGD);  Surgeon: Rogene Houston, MD;  [facility];  Service: Endoscopy;  Laterality: N/A;   ESOPHAGOGASTRODUODENOSCOPY N/A 05/10/2020   Procedure: ESOPHAGOGASTRODUODENOSCOPY (EGD);  Surgeon: Rogene Houston, MD;  [facility];  Service: Endoscopy;  Laterality: N/A;  210   HERNIA REPAIR     right inguinal   HYDROCELE EXCISION / REPAIR     IR FLUORO GUIDE CV LINE LEFT  08/17/2021   IR US GUIDE VASC ACCESS LEFT  08/17/2021   LIGATION OF COMPETING BRANCHES OF ARTERIOVENOUS FISTULA Right 09/11/2021   Procedure: LIGATION OF COMPETING BRANCHES OF RIGHT ARM ARTERIOVENOUS FISTULA;  Surgeon: [name], MD;  Location: [facility];  Service: Vascular;  Laterality: Right;   POLYPECTOMY  11/12/2018   Procedure: POLYPECTOMY;  Surgeon: Rogene Houston, MD;  [facility];  Service: Endoscopy;;  colon    RETINAL DETACHMENT SURGERY Left 2019   SPLENECTOMY, TOTAL     TONSILLECTOMY     VOCAL CORD INJECTION     removal of polyp-2005    SOCIAL HISTORY:  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  Tobacco Use   Smoking  status: Former    Packs/day: 3.00    Years: 30.00    Pack years: 90.00    Types: Cigarettes   Smokeless tobacco: Current   Tobacco comments:    Currently vape  Vaping Use   Vaping Use: Every day   Substances: Nicotine  Substance and Sexual Activity   Alcohol use: Yes    Alcohol/week: 1.0 - 2.0 standard drink    Types: 1 - 2 Cans of beer per week    Comment: Occasionally   Drug use: No   Sexual activity: Not Currently    Birth control/protection: Abstinence, None  Other Topics Concern   Not on file  Social History Narrative   2 children, both nearby   Social Determinants of Health   Financial Resource Strain: Low Risk    Difficulty of Paying Living Expenses: Not hard at all  Food Insecurity: No Food Insecurity   Worried About Charity fundraiser in the Last Year: Never true   Ran Out of Food in the Last Year: Never true  Transportation Needs: No Transportation  Needs   Lack of Transportation (Medical): No   Lack of Transportation (Non-Medical): No  Physical Activity: Insufficiently Active   Days of Exercise per Week: 3 days   Minutes of Exercise per Session: 30 min  Stress: No Stress Concern Present   Feeling of Stress : Only a little  Social Connections: Socially Isolated   Frequency of Communication with Friends and Family: More than three times a week   Frequency of Social Gatherings with Friends and Family: More than three times a week   Attends Religious Services: Never   Marine scientist or Organizations: No   Attends Music therapist: Never   Marital Status: Divorced  Human resources officer Violence: Not At Risk   Fear of Current or Ex-Partner: No   Emotionally Abused: No   Physically Abused: No   Sexually Abused: No    FAMILY HISTORY:  No family history on file.  CURRENT MEDICATIONS:  Current Outpatient Medications  Medication Sig Dispense Refill   aspirin EC 81 MG tablet Take 1 tablet (81 mg total) by mouth daily. 30 tablet 11    bortezomib IV (VELCADE) 3.5 MG injection Inject 1.5 mg/m2 into the vein once a week.     calcitRIOL (ROCALTROL) 0.25 MCG capsule Take 0.25 mcg by mouth daily.     cholecalciferol (VITAMIN D3) 25 MCG (1000 UNIT) tablet Take 2,000 Units by mouth daily.     CYCLOPHOSPHAMIDE IV Inject 300 mg/m2 into the vein once a week.     dexamethasone (DECADRON) 4 MG tablet Take 2.5 tablets (10 mg total) by mouth once a week. Take at home along with Tylenol and Benadryl weekly on the day of daratumumab injection. 30 tablet 2   dexamethasone 40 mg in sodium chloride 0.9 % 50 mL Inject 40 mg into the vein once a week.     docusate sodium (COLACE) 100 MG capsule Take 100 mg by mouth daily.     gabapentin (NEURONTIN) 300 MG capsule Take 1 capsule (300 mg total) by mouth 2 (two) times daily. 60 capsule 1   midodrine (PROAMATINE) 10 MG tablet Take by mouth.     Omega-3 Fatty Acids (FISH OIL) 1000 MG CAPS Take 1,000 mg by mouth in the morning, at noon, and at bedtime.      pantoprazole (PROTONIX) 40 MG tablet Take 1 tablet (40 mg total) by mouth 2 (two) times daily. 180 tablet 3   pravastatin (PRAVACHOL) 80 MG tablet Take 1 tablet (80 mg total) by mouth daily. 90 tablet 3   ranolazine (RANEXA) 500 MG 12 hr tablet Take 1 tablet (500 mg total) by mouth daily. 90 tablet 3   sodium bicarbonate 325 MG tablet Take 325 mg by mouth 3 (three) times daily.     tamsulosin (FLOMAX) 0.4 MG CAPS capsule Take 0.4 mg by mouth daily.      torsemide (DEMADEX) 100 MG tablet Take 100 mg by mouth 2 (two) times daily.     albuterol (VENTOLIN HFA) 108 (90 Base) MCG/ACT inhaler Inhale 2 puffs into the lungs every 6 (six) hours as needed for wheezing or shortness of breath. (Patient not taking: Reported on 11/22/2021) 8 g 0   budesonide-formoterol (SYMBICORT) 80-4.5 MCG/ACT inhaler Inhale 2 puffs into the lungs 2 (two) times daily as needed (shortness of breath). (Patient not taking: Reported on 11/22/2021)     oxyCODONE (OXY IR/ROXICODONE) 5 MG  immediate release tablet Take 1 tablet (5 mg total) by mouth 2 (two) times daily as needed for  severe pain. (Patient not taking: Reported on 11/22/2021) 30 tablet 0   polyethylene glycol (MIRALAX / GLYCOLAX) packet Take 17 g by mouth daily. Patient states that he takes as needed. (Patient not taking: Reported on 11/22/2021) 30 each 5   sildenafil (REVATIO) 20 MG tablet Take 1 tablet (20 mg total) by mouth 5 (five) times daily as needed. (Patient not taking: Reported on 11/22/2021) 30 tablet 2   tiZANidine (ZANAFLEX) 2 MG tablet TAKE 1 TABLET BY MOUTH EVERY 6 HOURS AS NEEDED FOR MUSCLE SPASM (Patient not taking: Reported on 11/22/2021) 30 tablet 0   No current facility-administered medications for this visit.   Facility-Administered Medications Ordered in Other Visits  Medication Dose Route Frequency Provider Last Rate Last Admin   palonosetron (ALOXI) 0.25 MG/5ML injection             ALLERGIES:  No Known Allergies  PHYSICAL EXAM:  Performance status (ECOG): 1 - Symptomatic but completely ambulatory  Vitals:   11/22/21 1117  BP: 110/72  Pulse: (!) 108  Resp: 20  Temp: 98 F (36.7 C)  SpO2: 95%   Wt Readings from Last 3 Encounters:  11/22/21 256 lb 9.6 oz (116.4 kg)  11/08/21 257 lb 8 oz (116.8 kg)  11/01/21 251 lb 3.2 oz (113.9 kg)   Physical Exam Vitals reviewed.  Constitutional:      Appearance: Normal appearance. He is obese.  Cardiovascular:     Rate and Rhythm: Normal rate and regular rhythm.     Pulses: Normal pulses.     Heart sounds: Normal heart sounds.  Pulmonary:     Effort: Pulmonary effort is normal.     Breath sounds: Normal breath sounds.  Neurological:     General: No focal deficit present.     Mental Status: He is alert and oriented to person, place, and time.  Psychiatric:        Mood and Affect: Mood normal.        Behavior: Behavior normal.    LABORATORY DATA:  I have reviewed the labs as listed.  CBC Latest Ref Rng & Units 11/19/2021 11/08/2021  11/01/2021  WBC 4.0 - 10.5 K/uL 9.1 8.4 8.4  Hemoglobin 13.0 - 17.0 g/dL 11.6(L) 10.0(L) 10.8(L)  Hematocrit 39.0 - 52.0 % 34.5(L) 30.9(L) 31.7(L)  Platelets 150 - 400 K/uL 225 207 199   CMP Latest Ref Rng & Units 11/19/2021 11/08/2021 11/01/2021  Glucose 70 - 99 mg/dL 106(H) 119(H) 105(H)  BUN 8 - 23 mg/dL 24(H) 25(H) 26(H)  Creatinine 0.61 - 1.24 mg/dL 3.28(H) 3.52(H) 3.61(H)  Sodium 135 - 145 mmol/L 133(L) 138 134(L)  Potassium 3.5 - 5.1 mmol/L 3.7 3.8 3.6  Chloride 98 - 111 mmol/L 97(L) 105 99  CO2 22 - 32 mmol/L '26 25 25  ' Calcium 8.9 - 10.3 mg/dL 8.6(L) 7.9(L) 9.1  Total Protein 6.5 - 8.1 g/dL 7.7 6.6 6.9  Total Bilirubin 0.3 - 1.2 mg/dL 0.3 0.5 0.4  Alkaline Phos 38 - 126 U/L 29(L) 28(L) 30(L)  AST 15 - 41 U/L 14(L) 17 15  ALT 0 - 44 U/L '22 21 26    ' DIAGNOSTIC IMAGING:  I have independently reviewed the scans and discussed with the patient. NM Myocar Multi W/Spect W/Wall Motion / EF  Result Date: 11/13/2021   Findings are consistent with ischemia. The study is low risk.   No ST deviation was noted. The ECG was negative for ischemia.   LV perfusion is abnormal.  Small, mild to moderate intensity, apical and  mid to basal inferoseptal defects that are partially reversible and consistent with ischemia.   Left ventricular function is normal. Nuclear stress EF: 60 %. Overall low risk study with small ischemic territories involving the apex and mid inferoseptal walls, LVEF normal at 60% without regional wall motion abnormalities.   ECHOCARDIOGRAM LIMITED  Result Date: 10/24/2021    ECHOCARDIOGRAM LIMITED REPORT   Patient Name:   Ernest Barnett Versteeg Date of Exam: 10/24/2021 Medical Rec #:  [id number]      Height:       67.0 in Accession #:    [id number]     Weight:       251.4 lb Date of Birth:  03/23/1956      BSA:          2.229 m Patient Age:    66 years       BP:           116/72 mmHg Patient Gender: M              HR:           104 bpm. Exam Location:  [facility] Procedure: Limited Echo  Indications:    R06.02 (ICD-10-CM) - SOB (shortness of breath)  History:        Patient has prior history of Echocardiogram examinations, most                 recent 07/10/2021. Stroke and COPD; Risk Factors:Hypertension and                 Dyslipidemia. Chronic kidney disease, stage V ([facility]). Multiple                 myeloma ([facility]).  Sonographer:    Alvino Chapel RCS Referring Phys: [id number] [place]  1. LImited study with Definity Apical windows foreshortened. Images are still difficult Mid/Distal inferior and apical hyokinesis. Overall LVEF is probably 50 to 55%.  2. Right ventricular systolic function is normal. The right ventricular size is normal. FINDINGS  Left Ventricle: LImited study with Definity Apical windows foreshortened. Images are still difficult Mid/Distal inferior and apical hyokinesis. Overall LVEF is probably 50 to 55%. Definity contrast agent was given IV to delineate the left ventricular endocardial borders. Right Ventricle: The right ventricular size is normal. Right vetricular wall thickness was not assessed. Right ventricular systolic function is normal. Pericardium: There is no evidence of pericardial effusion. Dorris Carnes MD Electronically signed by Dorris Carnes MD Signature Date/Time: 10/24/2021/11:26:48 PM    Final      ASSESSMENT:  1.  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  Basis for CKD is hypertension associated arteriosclerosis with arterionephrosclerosis. - Bone marrow biopsy on 02/21/2021 with slightly hypercellular bone marrow with trilineage hematopoiesis.  Plasma cells increased in number representing 11% of all cells with kappa light chain restriction. - Myeloma FISH panel with no evidence of T p53.  Plasma cell enrichment yielded limited cellularity, therefore only tested for T p53 probes at. - Chromosome analysis 45,  X,- Y (8)/46, XY (12) - Labs on 02/01/2021-M spike 2 g, kappa light chain 794, free light chain ratio 49.97.  Beta-2 microglobulin 5.5, albumin 3.5.  LDH normal. - PET scan on 03/08/2021 with no evidence of active myeloma.  No soft tissue plasmacytoma. - 24-hour urine total protein 2.2 g on 03/13/2021.  Immunofixation positive for IgG kappa. - 4  cycles of CyBorD from 03/14/2021 through 07/04/2021 held due to fluid retention.  Myeloma labs on 08/08/2021 showed progression. - Daratumumab, Velcade and dexamethasone started on 08/30/2021.   2.  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  Smoked 2 packs/day for 32 years and quit smoking cigarettes and vaping now. -Mother had small cell lung cancer.   3.  CKD: -He has stage V CKD thought to be secondary to hypertension. -Renal ultrasound on 12/19/2020 was negative for obstructive uropathy and findings were consistent with chronic medical renal disease.  Bilateral kidney cysts were seen. -He has 2.7 g of proteinuria on 24-hour urine. -Kidney biopsy was done on 01/25/2021.   PLAN:  1.  Stage II, standard risk IgG kappa multiple myeloma: - Daratumumab, Velcade and dexamethasone started on 08/30/2021. - He is continuing dexamethasone 10 mg weekly during injection days. - Daratumumab is changed to every 2 weeks. - Reviewed labs from 11/19/2021 which showed M spike stable at 0.4 g.  White count platelet count normal.  Kappa light chains improved to 48 and ratio improved to 4.39. - I have recommended continuing the current regimen.  RTC 6 weeks for follow-up with myeloma labs 1 week prior.   2.  Smoking history: - CT chest on 02/15/2021 was lung RADS category 1.   3.  ID prophylaxis: - Continue aspirin for thromboprophylaxis and acyclovir twice daily.   4.  Myeloma bone disease: - Last denosumab on 09/13/2021.  We are holding denosumab since dialysis started until more safety and efficacy data available.  5.  Lower  extremity swelling: - Continue torsemide 100 mg twice daily.  6.  Peripheral neuropathy: - Continue gabapentin which is helping.  7.  Low back pain: - Continue oxycodone and Tylenol as needed.  8.  ESRD on HD: - HD started on 08/22/2021, continues on Monday, Wednesday and Friday.   Orders placed this encounter:  No orders of the defined types were placed in this encounter.    Derek Jack, MD [facility] [phone]   I, Thana Ates, am acting as a scribe for Dr. Derek Jack.  I, Derek Jack MD, have reviewed the above documentation for accuracy and completeness, and I agree with the above.

## 2021-11-22 NOTE — Progress Notes (Signed)
Patient has been examined by Dr. Katragadda, and vital signs and labs have been reviewed. ANC, Creatinine, LFTs, hemoglobin, and platelets are within treatment parameters per M.D. - pt may proceed with treatment.    °

## 2021-11-22 NOTE — Progress Notes (Signed)
Reviewed labs , ok to treat per MD.  Patient takes pre-meds at home  Treatment given per orders. Patient tolerated it well without problems. Vitals stable and discharged home from clinic ambulatory. Follow up as scheduled.

## 2021-11-22 NOTE — Patient Instructions (Signed)
[facility]  Discharge Instructions: Thank you for choosing [facility] to provide your oncology and hematology care.  If you have a lab appointment with the Cancer Center, please come in thru the Main Entrance and check in at the main information desk.  Wear comfortable clothing and clothing appropriate for easy access to any Portacath or PICC line.   We strive to give you quality time with your provider. You may need to reschedule your appointment if you arrive late (15 or more minutes).  Arriving late affects you and other patients whose appointments are after yours.  Also, if you miss three or more appointments without notifying the office, you may be dismissed from the clinic at the provider's discretion.      For prescription refill requests, have your pharmacy contact our office and allow 72 hours for refills to be completed.        To help prevent nausea and vomiting after your treatment, we encourage you to take your nausea medication as directed.  BELOW ARE SYMPTOMS THAT SHOULD BE REPORTED IMMEDIATELY: *FEVER GREATER THAN 100.4 F (38 C) OR HIGHER *CHILLS OR SWEATING *NAUSEA AND VOMITING THAT IS NOT CONTROLLED WITH YOUR NAUSEA MEDICATION *UNUSUAL SHORTNESS OF BREATH *UNUSUAL BRUISING OR BLEEDING *URINARY PROBLEMS (pain or burning when urinating, or frequent urination) *BOWEL PROBLEMS (unusual diarrhea, constipation, pain near the anus) TENDERNESS IN MOUTH AND THROAT WITH OR WITHOUT PRESENCE OF ULCERS (sore throat, sores in mouth, or a toothache) UNUSUAL RASH, SWELLING OR PAIN  UNUSUAL VAGINAL DISCHARGE OR ITCHING   Items with * indicate a potential emergency and should be followed up as soon as possible or go to the Emergency Department if any problems should occur.  Please show the CHEMOTHERAPY ALERT CARD or IMMUNOTHERAPY ALERT CARD at check-in to the Emergency Department and triage nurse.  Should you have questions after your visit or need to cancel  or reschedule your appointment, please contact  [facility] [phone]  and follow the prompts.  Office hours are 8:00 a.m. to 4:30 p.m. Monday - Friday. Please note that voicemails left after 4:00 p.m. may not be returned until the following business day.  We are closed weekends and major holidays. You have access to a nurse at all times for urgent questions. Please call the main number to the clinic [phone] and follow the prompts.  For any non-urgent questions, you may also contact your provider using MyChart. We now offer e-Visits for anyone 18 and older to request care online for non-urgent symptoms. For details visit [email or web address].   Also download the MyChart app! Go to the app store, search "MyChart", open the app, select , and log in with your MyChart username and password.  Due to Covid, a mask is required upon entering the hospital/clinic. If you do not have a mask, one will be given to you upon arrival. For doctor visits, patients may have 1 support person aged 18 or older with them. For treatment visits, patients cannot have anyone with them due to current Covid guidelines and our immunocompromised population.  

## 2021-11-25 ENCOUNTER — Other Ambulatory Visit (HOSPITAL_COMMUNITY): Payer: Self-pay | Admitting: Hematology

## 2021-11-26 ENCOUNTER — Encounter (HOSPITAL_COMMUNITY): Payer: Self-pay | Admitting: Hematology

## 2021-11-26 DIAGNOSIS — Z992 Dependence on renal dialysis: Secondary | ICD-10-CM | POA: Diagnosis not present

## 2021-11-26 DIAGNOSIS — N186 End stage renal disease: Secondary | ICD-10-CM | POA: Diagnosis not present

## 2021-11-28 ENCOUNTER — Inpatient Hospital Stay (HOSPITAL_COMMUNITY): Admission: RE | Admit: 2021-11-28 | Payer: Medicare HMO | Source: Ambulatory Visit

## 2021-11-28 DIAGNOSIS — N186 End stage renal disease: Secondary | ICD-10-CM | POA: Diagnosis not present

## 2021-11-28 DIAGNOSIS — Z992 Dependence on renal dialysis: Secondary | ICD-10-CM | POA: Diagnosis not present

## 2021-11-29 ENCOUNTER — Inpatient Hospital Stay (HOSPITAL_COMMUNITY): Payer: Medicare HMO

## 2021-11-29 ENCOUNTER — Other Ambulatory Visit: Payer: Self-pay

## 2021-11-29 ENCOUNTER — Encounter (HOSPITAL_COMMUNITY): Payer: Self-pay

## 2021-11-29 VITALS — BP 111/72 | HR 101 | Temp 97.7°F | Resp 19 | Ht 66.0 in | Wt 255.1 lb

## 2021-11-29 DIAGNOSIS — C9000 Multiple myeloma not having achieved remission: Secondary | ICD-10-CM

## 2021-11-29 DIAGNOSIS — D472 Monoclonal gammopathy: Secondary | ICD-10-CM

## 2021-11-29 DIAGNOSIS — Z5112 Encounter for antineoplastic immunotherapy: Secondary | ICD-10-CM | POA: Diagnosis not present

## 2021-11-29 LAB — CBC WITH DIFFERENTIAL/PLATELET
Abs Immature Granulocytes: 0.03 10*3/uL (ref 0.00–0.07)
Basophils Absolute: 0 10*3/uL (ref 0.0–0.1)
Basophils Relative: 0 %
Eosinophils Absolute: 0.1 10*3/uL (ref 0.0–0.5)
Eosinophils Relative: 1 %
HCT: 36.6 % — ABNORMAL LOW (ref 39.0–52.0)
Hemoglobin: 12.1 g/dL — ABNORMAL LOW (ref 13.0–17.0)
Immature Granulocytes: 0 %
Lymphocytes Relative: 42 %
Lymphs Abs: 4.5 10*3/uL — ABNORMAL HIGH (ref 0.7–4.0)
MCH: 33 pg (ref 26.0–34.0)
MCHC: 33.1 g/dL (ref 30.0–36.0)
MCV: 99.7 fL (ref 80.0–100.0)
Monocytes Absolute: 1.2 10*3/uL — ABNORMAL HIGH (ref 0.1–1.0)
Monocytes Relative: 12 %
Neutro Abs: 4.8 10*3/uL (ref 1.7–7.7)
Neutrophils Relative %: 45 %
Platelets: 235 10*3/uL (ref 150–400)
RBC: 3.67 MIL/uL — ABNORMAL LOW (ref 4.22–5.81)
RDW: 14.7 % (ref 11.5–15.5)
WBC: 10.7 10*3/uL — ABNORMAL HIGH (ref 4.0–10.5)
nRBC: 0 % (ref 0.0–0.2)

## 2021-11-29 LAB — COMPREHENSIVE METABOLIC PANEL
ALT: 21 U/L (ref 0–44)
AST: 15 U/L (ref 15–41)
Albumin: 4.3 g/dL (ref 3.5–5.0)
Alkaline Phosphatase: 27 U/L — ABNORMAL LOW (ref 38–126)
Anion gap: 14 (ref 5–15)
BUN: 36 mg/dL — ABNORMAL HIGH (ref 8–23)
CO2: 25 mmol/L (ref 22–32)
Calcium: 9.4 mg/dL (ref 8.9–10.3)
Chloride: 97 mmol/L — ABNORMAL LOW (ref 98–111)
Creatinine, Ser: 4.34 mg/dL — ABNORMAL HIGH (ref 0.61–1.24)
GFR, Estimated: 14 mL/min — ABNORMAL LOW (ref >60)
Glucose, Bld: 105 mg/dL — ABNORMAL HIGH (ref 70–99)
Potassium: 3.8 mmol/L (ref 3.5–5.1)
Sodium: 136 mmol/L (ref 135–145)
Total Bilirubin: 0.4 mg/dL (ref 0.3–1.2)
Total Protein: 7.7 g/dL (ref 6.5–8.1)

## 2021-11-29 MED ORDER — BORTEZOMIB CHEMO SQ INJECTION 3.5 MG (2.5MG/ML)
1.0000 mg/m2 | Freq: Once | INTRAMUSCULAR | Status: AC
  Administered 2021-11-29: 2.25 mg via SUBCUTANEOUS
  Filled 2021-11-29: qty 0.9

## 2021-11-29 NOTE — Progress Notes (Signed)
Velcade injection given today per MD orders. Tolerated without adverse affects. Vital signs stable. No complaints at this time. Discharged from clinic ambulatory in stable condition. Alert and oriented x 3. F/U with [facility] as scheduled.   

## 2021-11-29 NOTE — Progress Notes (Signed)
Patient presents today for Velcade. No complaints from patient at this time. Patient reports taking pre-medications at home. Patient's heart-rate 106, rechecked 101, patient sitting up in chair talking, refuses to sit back and relax. Patients normal heart rate in 90s-low 100s.

## 2021-11-29 NOTE — Patient Instructions (Signed)
[place]  Discharge Instructions: Thank you for choosing [facility] to provide your oncology and hematology care.  If you have a lab appointment with the [facility], please come in thru the Main Entrance and check in at the main information desk.  Wear comfortable clothing and clothing appropriate for easy access to any Portacath or PICC line.   We strive to give you quality time with your provider. You may need to reschedule your appointment if you arrive late (15 or more minutes).  Arriving late affects you and other patients whose appointments are after yours.  Also, if you miss three or more appointments without notifying the office, you may be dismissed from the clinic at the providers discretion.      For prescription refill requests, have your pharmacy contact our office and allow 72 hours for refills to be completed.    Today you received the following chemotherapy and/or immunotherapy agents Velcade injection.       To help prevent nausea and vomiting after your treatment, we encourage you to take your nausea medication as directed.  BELOW ARE SYMPTOMS THAT SHOULD BE REPORTED IMMEDIATELY: *FEVER GREATER THAN 100.4 F (38 C) OR HIGHER *CHILLS OR SWEATING *NAUSEA AND VOMITING THAT IS NOT CONTROLLED WITH YOUR NAUSEA MEDICATION *UNUSUAL SHORTNESS OF BREATH *UNUSUAL BRUISING OR BLEEDING *URINARY PROBLEMS (pain or burning when urinating, or frequent urination) *BOWEL PROBLEMS (unusual diarrhea, constipation, pain near the anus) TENDERNESS IN MOUTH AND THROAT WITH OR WITHOUT PRESENCE OF ULCERS (sore throat, sores in mouth, or a toothache) UNUSUAL RASH, SWELLING OR PAIN  UNUSUAL VAGINAL DISCHARGE OR ITCHING   Items with * indicate a potential emergency and should be followed up as soon as possible or go to the Emergency Department if any problems should occur.  Please show the CHEMOTHERAPY ALERT CARD or IMMUNOTHERAPY ALERT CARD at check-in to the  Emergency Department and triage nurse.  Should you have questions after your visit or need to cancel or reschedule your appointment, please contact [facility] [phone]  and follow the prompts.  Office hours are 8:00 a.m. to 4:30 p.m. Monday - Friday. Please note that voicemails left after 4:00 p.m. may not be returned until the following business day.  We are closed weekends and major holidays. You have access to a nurse at all times for urgent questions. Please call the main number to the clinic [phone] and follow the prompts.  For any non-urgent questions, you may also contact your provider using MyChart. We now offer e-Visits for anyone 78 and older to request care online for non-urgent symptoms. For details visit [email or web address].   Also download the MyChart app! Go to the app store, search "MyChart", open the app, select [facility], and log in with your MyChart username and password.  Due to Covid, a mask is required upon entering the hospital/clinic. If you do not have a mask, one will be given to you upon arrival. For doctor visits, patients may have 1 support person aged 12 or older with them. For treatment visits, patients cannot have anyone with them due to current Covid guidelines and our immunocompromised population.

## 2021-11-30 DIAGNOSIS — Z992 Dependence on renal dialysis: Secondary | ICD-10-CM | POA: Diagnosis not present

## 2021-11-30 DIAGNOSIS — N186 End stage renal disease: Secondary | ICD-10-CM | POA: Diagnosis not present

## 2021-12-03 DIAGNOSIS — N186 End stage renal disease: Secondary | ICD-10-CM | POA: Diagnosis not present

## 2021-12-03 DIAGNOSIS — Z992 Dependence on renal dialysis: Secondary | ICD-10-CM | POA: Diagnosis not present

## 2021-12-03 DIAGNOSIS — N2581 Secondary hyperparathyroidism of renal origin: Secondary | ICD-10-CM | POA: Diagnosis not present

## 2021-12-04 DIAGNOSIS — N186 End stage renal disease: Secondary | ICD-10-CM | POA: Diagnosis not present

## 2021-12-04 DIAGNOSIS — Z992 Dependence on renal dialysis: Secondary | ICD-10-CM | POA: Diagnosis not present

## 2021-12-05 DIAGNOSIS — Z992 Dependence on renal dialysis: Secondary | ICD-10-CM | POA: Diagnosis not present

## 2021-12-05 DIAGNOSIS — N186 End stage renal disease: Secondary | ICD-10-CM | POA: Diagnosis not present

## 2021-12-07 DIAGNOSIS — N186 End stage renal disease: Secondary | ICD-10-CM | POA: Diagnosis not present

## 2021-12-07 DIAGNOSIS — Z992 Dependence on renal dialysis: Secondary | ICD-10-CM | POA: Diagnosis not present

## 2021-12-10 DIAGNOSIS — Z992 Dependence on renal dialysis: Secondary | ICD-10-CM | POA: Diagnosis not present

## 2021-12-10 DIAGNOSIS — N186 End stage renal disease: Secondary | ICD-10-CM | POA: Diagnosis not present

## 2021-12-11 ENCOUNTER — Ambulatory Visit: Payer: Medicare HMO | Admitting: Physician Assistant

## 2021-12-12 DIAGNOSIS — Z992 Dependence on renal dialysis: Secondary | ICD-10-CM | POA: Diagnosis not present

## 2021-12-12 DIAGNOSIS — N186 End stage renal disease: Secondary | ICD-10-CM | POA: Diagnosis not present

## 2021-12-13 ENCOUNTER — Inpatient Hospital Stay (HOSPITAL_COMMUNITY): Payer: Medicare HMO | Attending: Hematology

## 2021-12-13 ENCOUNTER — Inpatient Hospital Stay (HOSPITAL_COMMUNITY): Payer: Medicare HMO

## 2021-12-13 ENCOUNTER — Other Ambulatory Visit: Payer: Self-pay

## 2021-12-13 VITALS — BP 123/73 | HR 100 | Temp 98.4°F | Resp 18 | Ht 66.0 in | Wt 258.0 lb

## 2021-12-13 DIAGNOSIS — C9000 Multiple myeloma not having achieved remission: Secondary | ICD-10-CM

## 2021-12-13 DIAGNOSIS — Z79899 Other long term (current) drug therapy: Secondary | ICD-10-CM | POA: Insufficient documentation

## 2021-12-13 DIAGNOSIS — Z7982 Long term (current) use of aspirin: Secondary | ICD-10-CM | POA: Insufficient documentation

## 2021-12-13 DIAGNOSIS — N186 End stage renal disease: Secondary | ICD-10-CM | POA: Insufficient documentation

## 2021-12-13 DIAGNOSIS — F17290 Nicotine dependence, other tobacco product, uncomplicated: Secondary | ICD-10-CM | POA: Insufficient documentation

## 2021-12-13 DIAGNOSIS — Z5112 Encounter for antineoplastic immunotherapy: Secondary | ICD-10-CM | POA: Diagnosis not present

## 2021-12-13 DIAGNOSIS — I129 Hypertensive chronic kidney disease with stage 1 through stage 4 chronic kidney disease, or unspecified chronic kidney disease: Secondary | ICD-10-CM | POA: Insufficient documentation

## 2021-12-13 DIAGNOSIS — N184 Chronic kidney disease, stage 4 (severe): Secondary | ICD-10-CM | POA: Insufficient documentation

## 2021-12-13 DIAGNOSIS — G620 Drug-induced polyneuropathy: Secondary | ICD-10-CM | POA: Insufficient documentation

## 2021-12-13 DIAGNOSIS — M7989 Other specified soft tissue disorders: Secondary | ICD-10-CM | POA: Insufficient documentation

## 2021-12-13 DIAGNOSIS — Z802 Family history of malignant neoplasm of other respiratory and intrathoracic organs: Secondary | ICD-10-CM | POA: Insufficient documentation

## 2021-12-13 DIAGNOSIS — M545 Low back pain, unspecified: Secondary | ICD-10-CM | POA: Insufficient documentation

## 2021-12-13 DIAGNOSIS — Z992 Dependence on renal dialysis: Secondary | ICD-10-CM | POA: Insufficient documentation

## 2021-12-13 DIAGNOSIS — D472 Monoclonal gammopathy: Secondary | ICD-10-CM

## 2021-12-13 LAB — CBC WITH DIFFERENTIAL/PLATELET
Abs Immature Granulocytes: 0.02 10*3/uL (ref 0.00–0.07)
Basophils Absolute: 0.1 10*3/uL (ref 0.0–0.1)
Basophils Relative: 1 %
Eosinophils Absolute: 0.1 10*3/uL (ref 0.0–0.5)
Eosinophils Relative: 1 %
HCT: 35.7 % — ABNORMAL LOW (ref 39.0–52.0)
Hemoglobin: 11.9 g/dL — ABNORMAL LOW (ref 13.0–17.0)
Immature Granulocytes: 0 %
Lymphocytes Relative: 46 %
Lymphs Abs: 3.7 10*3/uL (ref 0.7–4.0)
MCH: 33 pg (ref 26.0–34.0)
MCHC: 33.3 g/dL (ref 30.0–36.0)
MCV: 98.9 fL (ref 80.0–100.0)
Monocytes Absolute: 1.1 10*3/uL — ABNORMAL HIGH (ref 0.1–1.0)
Monocytes Relative: 13 %
Neutro Abs: 3.1 10*3/uL (ref 1.7–7.7)
Neutrophils Relative %: 39 %
Platelets: 216 10*3/uL (ref 150–400)
RBC: 3.61 MIL/uL — ABNORMAL LOW (ref 4.22–5.81)
RDW: 14.3 % (ref 11.5–15.5)
WBC: 8 10*3/uL (ref 4.0–10.5)
nRBC: 0 % (ref 0.0–0.2)

## 2021-12-13 LAB — COMPREHENSIVE METABOLIC PANEL
ALT: 27 U/L (ref 0–44)
AST: 17 U/L (ref 15–41)
Albumin: 4 g/dL (ref 3.5–5.0)
Alkaline Phosphatase: 23 U/L — ABNORMAL LOW (ref 38–126)
Anion gap: 10 (ref 5–15)
BUN: 23 mg/dL (ref 8–23)
CO2: 25 mmol/L (ref 22–32)
Calcium: 8.7 mg/dL — ABNORMAL LOW (ref 8.9–10.3)
Chloride: 101 mmol/L (ref 98–111)
Creatinine, Ser: 3.86 mg/dL — ABNORMAL HIGH (ref 0.61–1.24)
GFR, Estimated: 17 mL/min — ABNORMAL LOW (ref >60)
Glucose, Bld: 98 mg/dL (ref 70–99)
Potassium: 4 mmol/L (ref 3.5–5.1)
Sodium: 136 mmol/L (ref 135–145)
Total Bilirubin: 0.5 mg/dL (ref 0.3–1.2)
Total Protein: 7 g/dL (ref 6.5–8.1)

## 2021-12-13 LAB — MAGNESIUM: Magnesium: 2 mg/dL (ref 1.7–2.4)

## 2021-12-13 MED ORDER — BORTEZOMIB CHEMO SQ INJECTION 3.5 MG (2.5MG/ML)
1.0000 mg/m2 | Freq: Once | INTRAMUSCULAR | Status: AC
  Administered 2021-12-13: 2.25 mg via SUBCUTANEOUS
  Filled 2021-12-13: qty 0.9

## 2021-12-13 MED ORDER — DARATUMUMAB-HYALURONIDASE-FIHJ 1800-30000 MG-UT/15ML ~~LOC~~ SOLN
1800.0000 mg | Freq: Once | SUBCUTANEOUS | Status: AC
  Administered 2021-12-13: 1800 mg via SUBCUTANEOUS
  Filled 2021-12-13: qty 15

## 2021-12-13 NOTE — Patient Instructions (Signed)
[place]  Discharge Instructions: Thank you for choosing [facility] to provide your oncology and hematology care.  If you have a lab appointment with the [facility], please come in thru the Main Entrance and check in at the main information desk.  Wear comfortable clothing and clothing appropriate for easy access to any Portacath or PICC line.   We strive to give you quality time with your provider. You may need to reschedule your appointment if you arrive late (15 or more minutes).  Arriving late affects you and other patients whose appointments are after yours.  Also, if you miss three or more appointments without notifying the office, you may be dismissed from the clinic at the providers discretion.      For prescription refill requests, have your pharmacy contact our office and allow 72 hours for refills to be completed.    Today you received the following chemotherapy and/or immunotherapy agents Daratumumab and Velcade      To help prevent nausea and vomiting after your treatment, we encourage you to take your nausea medication as directed.  BELOW ARE SYMPTOMS THAT SHOULD BE REPORTED IMMEDIATELY: *FEVER GREATER THAN 100.4 F (38 C) OR HIGHER *CHILLS OR SWEATING *NAUSEA AND VOMITING THAT IS NOT CONTROLLED WITH YOUR NAUSEA MEDICATION *UNUSUAL SHORTNESS OF BREATH *UNUSUAL BRUISING OR BLEEDING *URINARY PROBLEMS (pain or burning when urinating, or frequent urination) *BOWEL PROBLEMS (unusual diarrhea, constipation, pain near the anus) TENDERNESS IN MOUTH AND THROAT WITH OR WITHOUT PRESENCE OF ULCERS (sore throat, sores in mouth, or a toothache) UNUSUAL RASH, SWELLING OR PAIN  UNUSUAL VAGINAL DISCHARGE OR ITCHING   Items with * indicate a potential emergency and should be followed up as soon as possible or go to the Emergency Department if any problems should occur.  Please show the CHEMOTHERAPY ALERT CARD or IMMUNOTHERAPY ALERT CARD at check-in to the  Emergency Department and triage nurse.  Should you have questions after your visit or need to cancel or reschedule your appointment, please contact [facility] [phone]  and follow the prompts.  Office hours are 8:00 a.m. to 4:30 p.m. Monday - Friday. Please note that voicemails left after 4:00 p.m. may not be returned until the following business day.  We are closed weekends and major holidays. You have access to a nurse at all times for urgent questions. Please call the main number to the clinic [phone] and follow the prompts.  For any non-urgent questions, you may also contact your provider using MyChart. We now offer e-Visits for anyone 65 and older to request care online for non-urgent symptoms. For details visit [email or web address].   Also download the MyChart app! Go to the app store, search "MyChart", open the app, select [facility], and log in with your MyChart username and password.  Due to Covid, a mask is required upon entering the hospital/clinic. If you do not have a mask, one will be given to you upon arrival. For doctor visits, patients may have 1 support person aged 43 or older with them. For treatment visits, patients cannot have anyone with them due to current Covid guidelines and our immunocompromised population.

## 2021-12-13 NOTE — Progress Notes (Signed)
Patient presents today for Velcade and Daratumumab injections per providers order.  Vital signs and labs within parameters for treatment. Daratumumab and Velcade administration without incident; injection site WNL; see MAR for injection details.  Patient tolerated procedure well and without incident.  No questions or complaints noted at this time. Discharge from clinic ambulatory in stable condition.  Alert and oriented X 3.  Follow up with [facility] as scheduled.

## 2021-12-14 DIAGNOSIS — N186 End stage renal disease: Secondary | ICD-10-CM | POA: Diagnosis not present

## 2021-12-14 DIAGNOSIS — Z992 Dependence on renal dialysis: Secondary | ICD-10-CM | POA: Diagnosis not present

## 2021-12-17 DIAGNOSIS — Z992 Dependence on renal dialysis: Secondary | ICD-10-CM | POA: Diagnosis not present

## 2021-12-17 DIAGNOSIS — N186 End stage renal disease: Secondary | ICD-10-CM | POA: Diagnosis not present

## 2021-12-19 ENCOUNTER — Other Ambulatory Visit (HOSPITAL_COMMUNITY): Payer: Self-pay | Admitting: *Deleted

## 2021-12-19 DIAGNOSIS — Z992 Dependence on renal dialysis: Secondary | ICD-10-CM | POA: Diagnosis not present

## 2021-12-19 DIAGNOSIS — N186 End stage renal disease: Secondary | ICD-10-CM | POA: Diagnosis not present

## 2021-12-19 MED ORDER — GABAPENTIN 300 MG PO CAPS: 300.0000 mg | ORAL_CAPSULE | Freq: Two times a day (BID) | ORAL | 0 refills | Status: DC

## 2021-12-20 ENCOUNTER — Inpatient Hospital Stay (HOSPITAL_COMMUNITY): Payer: Medicare HMO

## 2021-12-20 ENCOUNTER — Encounter (HOSPITAL_COMMUNITY): Payer: Self-pay

## 2021-12-20 VITALS — BP 102/68 | HR 92 | Temp 97.8°F | Resp 20 | Ht 66.0 in | Wt 255.8 lb

## 2021-12-20 DIAGNOSIS — C9000 Multiple myeloma not having achieved remission: Secondary | ICD-10-CM | POA: Diagnosis not present

## 2021-12-20 DIAGNOSIS — Z5112 Encounter for antineoplastic immunotherapy: Secondary | ICD-10-CM | POA: Diagnosis not present

## 2021-12-20 DIAGNOSIS — D472 Monoclonal gammopathy: Secondary | ICD-10-CM

## 2021-12-20 LAB — CBC WITH DIFFERENTIAL/PLATELET
Abs Immature Granulocytes: 0.03 10*3/uL (ref 0.00–0.07)
Basophils Absolute: 0 10*3/uL (ref 0.0–0.1)
Basophils Relative: 0 %
Eosinophils Absolute: 0.1 10*3/uL (ref 0.0–0.5)
Eosinophils Relative: 1 %
HCT: 33.7 % — ABNORMAL LOW (ref 39.0–52.0)
Hemoglobin: 10.9 g/dL — ABNORMAL LOW (ref 13.0–17.0)
Immature Granulocytes: 0 %
Lymphocytes Relative: 46 %
Lymphs Abs: 3.3 10*3/uL (ref 0.7–4.0)
MCH: 31.3 pg (ref 26.0–34.0)
MCHC: 32.3 g/dL (ref 30.0–36.0)
MCV: 96.8 fL (ref 80.0–100.0)
Monocytes Absolute: 0.8 10*3/uL (ref 0.1–1.0)
Monocytes Relative: 11 %
Neutro Abs: 3 10*3/uL (ref 1.7–7.7)
Neutrophils Relative %: 42 %
Platelets: 197 10*3/uL (ref 150–400)
RBC: 3.48 MIL/uL — ABNORMAL LOW (ref 4.22–5.81)
RDW: 14.4 % (ref 11.5–15.5)
WBC: 7.2 10*3/uL (ref 4.0–10.5)
nRBC: 0 % (ref 0.0–0.2)

## 2021-12-20 LAB — COMPREHENSIVE METABOLIC PANEL
ALT: 24 U/L (ref 0–44)
AST: 16 U/L (ref 15–41)
Albumin: 4 g/dL (ref 3.5–5.0)
Alkaline Phosphatase: 24 U/L — ABNORMAL LOW (ref 38–126)
Anion gap: 14 (ref 5–15)
BUN: 31 mg/dL — ABNORMAL HIGH (ref 8–23)
CO2: 22 mmol/L (ref 22–32)
Calcium: 9.2 mg/dL (ref 8.9–10.3)
Chloride: 102 mmol/L (ref 98–111)
Creatinine, Ser: 3.77 mg/dL — ABNORMAL HIGH (ref 0.61–1.24)
GFR, Estimated: 17 mL/min — ABNORMAL LOW (ref >60)
Glucose, Bld: 104 mg/dL — ABNORMAL HIGH (ref 70–99)
Potassium: 3.8 mmol/L (ref 3.5–5.1)
Sodium: 138 mmol/L (ref 135–145)
Total Bilirubin: 0.6 mg/dL (ref 0.3–1.2)
Total Protein: 6.8 g/dL (ref 6.5–8.1)

## 2021-12-20 LAB — MAGNESIUM: Magnesium: 2.1 mg/dL (ref 1.7–2.4)

## 2021-12-20 LAB — LACTATE DEHYDROGENASE: LDH: 197 U/L — ABNORMAL HIGH (ref 98–192)

## 2021-12-20 MED ORDER — BORTEZOMIB CHEMO SQ INJECTION 3.5 MG (2.5MG/ML)
1.0000 mg/m2 | Freq: Once | INTRAMUSCULAR | Status: AC
  Administered 2021-12-20: 2.25 mg via SUBCUTANEOUS
  Filled 2021-12-20: qty 0.9

## 2021-12-20 NOTE — Patient Instructions (Signed)
[place]  Discharge Instructions: Thank you for choosing [facility] to provide your oncology and hematology care.  If you have a lab appointment with the [facility], please come in thru the Main Entrance and check in at the main information desk.  Wear comfortable clothing and clothing appropriate for easy access to any Portacath or PICC line.   We strive to give you quality time with your provider. You may need to reschedule your appointment if you arrive late (15 or more minutes).  Arriving late affects you and other patients whose appointments are after yours.  Also, if you miss three or more appointments without notifying the office, you may be dismissed from the clinic at the providers discretion.      For prescription refill requests, have your pharmacy contact our office and allow 72 hours for refills to be completed.    Today you received the following chemotherapy and/or immunotherapy agents Velcade, return as scheduled.   To help prevent nausea and vomiting after your treatment, we encourage you to take your nausea medication as directed.  BELOW ARE SYMPTOMS THAT SHOULD BE REPORTED IMMEDIATELY: *FEVER GREATER THAN 100.4 F (38 C) OR HIGHER *CHILLS OR SWEATING *NAUSEA AND VOMITING THAT IS NOT CONTROLLED WITH YOUR NAUSEA MEDICATION *UNUSUAL SHORTNESS OF BREATH *UNUSUAL BRUISING OR BLEEDING *URINARY PROBLEMS (pain or burning when urinating, or frequent urination) *BOWEL PROBLEMS (unusual diarrhea, constipation, pain near the anus) TENDERNESS IN MOUTH AND THROAT WITH OR WITHOUT PRESENCE OF ULCERS (sore throat, sores in mouth, or a toothache) UNUSUAL RASH, SWELLING OR PAIN  UNUSUAL VAGINAL DISCHARGE OR ITCHING   Items with * indicate a potential emergency and should be followed up as soon as possible or go to the Emergency Department if any problems should occur.  Please show the CHEMOTHERAPY ALERT CARD or IMMUNOTHERAPY ALERT CARD at check-in to the  Emergency Department and triage nurse.  Should you have questions after your visit or need to cancel or reschedule your appointment, please contact [facility] [phone]  and follow the prompts.  Office hours are 8:00 a.m. to 4:30 p.m. Monday - Friday. Please note that voicemails left after 4:00 p.m. may not be returned until the following business day.  We are closed weekends and major holidays. You have access to a nurse at all times for urgent questions. Please call the main number to the clinic [phone] and follow the prompts.  For any non-urgent questions, you may also contact your provider using MyChart. We now offer e-Visits for anyone 78 and older to request care online for non-urgent symptoms. For details visit [email or web address].   Also download the MyChart app! Go to the app store, search "MyChart", open the app, select [facility], and log in with your MyChart username and password.  Due to Covid, a mask is required upon entering the hospital/clinic. If you do not have a mask, one will be given to you upon arrival. For doctor visits, patients may have 1 support person aged 53 or older with them. For treatment visits, patients cannot have anyone with them due to current Covid guidelines and our immunocompromised population.

## 2021-12-20 NOTE — Progress Notes (Signed)
Patient presents today for Velcade, patient reports takings Dexamethasone at home. Patient tolerated Velcade injection with no complaints voiced. Lab work reviewed. See MAR for details. Injection site clean and dry with no bruising or swelling noted. Patient stable during and after injection. Band aid applied. VSS. Patient left in satisfactory condition with no s/s of distress noted.

## 2021-12-21 DIAGNOSIS — N186 End stage renal disease: Secondary | ICD-10-CM | POA: Diagnosis not present

## 2021-12-21 DIAGNOSIS — Z992 Dependence on renal dialysis: Secondary | ICD-10-CM | POA: Diagnosis not present

## 2021-12-21 LAB — PROTEIN ELECTROPHORESIS, SERUM
A/G Ratio: 1.3 (ref 0.7–1.7)
Albumin ELP: 3.7 g/dL (ref 2.9–4.4)
Alpha-1-Globulin: 0.2 g/dL (ref 0.0–0.4)
Alpha-2-Globulin: 0.9 g/dL (ref 0.4–1.0)
Beta Globulin: 1 g/dL (ref 0.7–1.3)
Gamma Globulin: 0.7 g/dL (ref 0.4–1.8)
Globulin, Total: 2.9 g/dL (ref 2.2–3.9)
M-Spike, %: 0.3 g/dL — ABNORMAL HIGH
Total Protein ELP: 6.6 g/dL (ref 6.0–8.5)

## 2021-12-21 LAB — KAPPA/LAMBDA LIGHT CHAINS
Kappa free light chain: 48.6 mg/L — ABNORMAL HIGH (ref 3.3–19.4)
Kappa, lambda light chain ratio: 5.34 — ABNORMAL HIGH (ref 0.26–1.65)
Lambda free light chains: 9.1 mg/L (ref 5.7–26.3)

## 2021-12-24 DIAGNOSIS — N186 End stage renal disease: Secondary | ICD-10-CM | POA: Diagnosis not present

## 2021-12-24 DIAGNOSIS — Z992 Dependence on renal dialysis: Secondary | ICD-10-CM | POA: Diagnosis not present

## 2021-12-26 DIAGNOSIS — N186 End stage renal disease: Secondary | ICD-10-CM | POA: Diagnosis not present

## 2021-12-26 DIAGNOSIS — Z992 Dependence on renal dialysis: Secondary | ICD-10-CM | POA: Diagnosis not present

## 2021-12-28 DIAGNOSIS — N186 End stage renal disease: Secondary | ICD-10-CM | POA: Diagnosis not present

## 2021-12-28 DIAGNOSIS — Z992 Dependence on renal dialysis: Secondary | ICD-10-CM | POA: Diagnosis not present

## 2022-01-01 DIAGNOSIS — Z992 Dependence on renal dialysis: Secondary | ICD-10-CM | POA: Diagnosis not present

## 2022-01-01 DIAGNOSIS — N186 End stage renal disease: Secondary | ICD-10-CM | POA: Diagnosis not present

## 2022-01-02 DIAGNOSIS — N186 End stage renal disease: Secondary | ICD-10-CM | POA: Diagnosis not present

## 2022-01-02 DIAGNOSIS — Z992 Dependence on renal dialysis: Secondary | ICD-10-CM | POA: Diagnosis not present

## 2022-01-03 ENCOUNTER — Inpatient Hospital Stay (HOSPITAL_COMMUNITY): Payer: Medicare HMO

## 2022-01-03 ENCOUNTER — Other Ambulatory Visit: Payer: Self-pay

## 2022-01-03 ENCOUNTER — Inpatient Hospital Stay (HOSPITAL_COMMUNITY): Payer: Medicare HMO | Attending: Hematology | Admitting: Hematology

## 2022-01-03 VITALS — BP 126/71 | HR 106 | Temp 98.4°F | Resp 19 | Ht 66.0 in | Wt 260.0 lb

## 2022-01-03 DIAGNOSIS — Z5112 Encounter for antineoplastic immunotherapy: Secondary | ICD-10-CM | POA: Insufficient documentation

## 2022-01-03 DIAGNOSIS — C9000 Multiple myeloma not having achieved remission: Secondary | ICD-10-CM | POA: Diagnosis not present

## 2022-01-03 DIAGNOSIS — D472 Monoclonal gammopathy: Secondary | ICD-10-CM

## 2022-01-03 LAB — CBC WITH DIFFERENTIAL/PLATELET
Abs Immature Granulocytes: 0.02 10*3/uL (ref 0.00–0.07)
Basophils Absolute: 0.1 10*3/uL (ref 0.0–0.1)
Basophils Relative: 1 %
Eosinophils Absolute: 0.1 10*3/uL (ref 0.0–0.5)
Eosinophils Relative: 1 %
HCT: 36.9 % — ABNORMAL LOW (ref 39.0–52.0)
Hemoglobin: 12 g/dL — ABNORMAL LOW (ref 13.0–17.0)
Immature Granulocytes: 0 %
Lymphocytes Relative: 34 %
Lymphs Abs: 2.8 10*3/uL (ref 0.7–4.0)
MCH: 32 pg (ref 26.0–34.0)
MCHC: 32.5 g/dL (ref 30.0–36.0)
MCV: 98.4 fL (ref 80.0–100.0)
Monocytes Absolute: 0.9 10*3/uL (ref 0.1–1.0)
Monocytes Relative: 11 %
Neutro Abs: 4.4 10*3/uL (ref 1.7–7.7)
Neutrophils Relative %: 53 %
Platelets: 228 10*3/uL (ref 150–400)
RBC: 3.75 MIL/uL — ABNORMAL LOW (ref 4.22–5.81)
RDW: 14.1 % (ref 11.5–15.5)
WBC: 8.2 10*3/uL (ref 4.0–10.5)
nRBC: 0 % (ref 0.0–0.2)

## 2022-01-03 LAB — COMPREHENSIVE METABOLIC PANEL
ALT: 19 U/L (ref 0–44)
AST: 13 U/L — ABNORMAL LOW (ref 15–41)
Albumin: 3.9 g/dL (ref 3.5–5.0)
Alkaline Phosphatase: 27 U/L — ABNORMAL LOW (ref 38–126)
Anion gap: 14 (ref 5–15)
BUN: 40 mg/dL — ABNORMAL HIGH (ref 8–23)
CO2: 22 mmol/L (ref 22–32)
Calcium: 8.3 mg/dL — ABNORMAL LOW (ref 8.9–10.3)
Chloride: 102 mmol/L (ref 98–111)
Creatinine, Ser: 4.37 mg/dL — ABNORMAL HIGH (ref 0.61–1.24)
GFR, Estimated: 14 mL/min — ABNORMAL LOW (ref >60)
Glucose, Bld: 118 mg/dL — ABNORMAL HIGH (ref 70–99)
Potassium: 3.4 mmol/L — ABNORMAL LOW (ref 3.5–5.1)
Sodium: 138 mmol/L (ref 135–145)
Total Bilirubin: 0.4 mg/dL (ref 0.3–1.2)
Total Protein: 7.3 g/dL (ref 6.5–8.1)

## 2022-01-03 LAB — MAGNESIUM: Magnesium: 2.2 mg/dL (ref 1.7–2.4)

## 2022-01-03 MED ORDER — DARATUMUMAB-HYALURONIDASE-FIHJ 1800-30000 MG-UT/15ML ~~LOC~~ SOLN
1800.0000 mg | Freq: Once | SUBCUTANEOUS | Status: AC
  Administered 2022-01-03: 1800 mg via SUBCUTANEOUS
  Filled 2022-01-03: qty 15

## 2022-01-03 MED ORDER — BORTEZOMIB CHEMO SQ INJECTION 3.5 MG (2.5MG/ML)
1.0000 mg/m2 | Freq: Once | INTRAMUSCULAR | Status: AC
  Administered 2022-01-03: 2.25 mg via SUBCUTANEOUS
  Filled 2022-01-03: qty 0.9

## 2022-01-03 NOTE — Progress Notes (Signed)
[facility],  [address]   CLINIC:  Medical Oncology/Hematology  PCP:  Lindell Spar, MD [address] / [place] [address] [phone]   REASON FOR VISIT:  Follow-up for multiple myeloma  PRIOR THERAPY: CyBorD  NGS Results: not done  CURRENT THERAPY: Velcade, daratumumab and dexamethasone  BRIEF ONCOLOGIC HISTORY:  Oncology History  Multiple myeloma ([place])  03/06/2021 Initial Diagnosis   Multiple myeloma ([place])   03/06/2021 Cancer Staging   Staging form: Plasma Cell Myeloma and Plasma Cell Disorders, AJCC 8th Edition - Clinical stage from 03/06/2021: RISS Stage II (Beta-2-microglobulin (mg/L): 5.5, Albumin (g/dL): 3.5, ISS: Stage III, High-risk cytogenetics: Absent, LDH: Normal) - Signed by Derek Jack, MD on 03/06/2021 Stage prefix: Initial diagnosis Beta 2 microglobulin range (mg/L): Greater than or equal to 5.5 Albumin range (g/dL): Greater than or equal to 3.5 Cytogenetics: No abnormalities Pretreatment monoclonal protein level in serum (M spike) (g/dL): 2 Pretreatment serum free kappa light chain level (g/L): 794 Pretreatment serum free lambda light chain level (g/L): 15.9    03/14/2021 - 07/04/2021 Chemotherapy   Patient is on Treatment Plan : MULTIPLE MYELOMA CyBorD - Weekly Bortezomib     08/30/2021 -  Chemotherapy   Patient is on Treatment Plan : MYELOMA RELAPSED / REFRACTORY Daratumumab SQ + Bortezomib + Dexamethasone (DaraVd) q21d / Daratumumab SQ q28d        CANCER STAGING:  Cancer Staging  Multiple myeloma ([place]) Staging form: Plasma Cell Myeloma and Plasma Cell Disorders, AJCC 8th Edition - Clinical stage from 03/06/2021: RISS Stage II (Beta-2-microglobulin (mg/L): 5.5, Albumin (g/dL): 3.5, ISS: Stage III, High-risk cytogenetics: Absent, LDH: Normal) - Signed by Derek Jack, MD on 03/06/2021   INTERVAL HISTORY:  Ernest Barnett, a 66 y.o. male, returns for routine follow-up and consideration  for next cycle of chemotherapy. Ernest Barnett was last seen on 11/22/2021.  Due for cycle #7 of Velcade and Darzalex Faspro today.   Overall, Ernest Barnett tells me Ernest Barnett has been feeling pretty well. Ernest Barnett reports dry, scaling skin on his forehead. Ernest Barnett reports numbness in the big toe on his left foot. Ernest Barnett reports leg swelling bilaterally, and Ernest Barnett is taking torsemide BID. Ernest Barnett is urinating 3 times daily.    Overall, Ernest Barnett feels ready for next cycle of chemo today.   REVIEW OF SYSTEMS:  Review of Systems  Constitutional:  Negative for appetite change and fatigue.  Respiratory:  Positive for shortness of breath.   Cardiovascular:  Positive for leg swelling.  All other systems reviewed and are negative.  PAST MEDICAL/SURGICAL HISTORY:  Past Medical History:  Diagnosis Date   Chronic kidney disease    COPD (chronic obstructive pulmonary disease) (HCC)    CVA (cerebral vascular accident) ([place]) 2013   Cystoid macular edema of left eye 03/07/2021   GERD (gastroesophageal reflux disease)    HOH (hard of hearing)    Hypercholesteremia    Hypertension    Left epiretinal membrane 03/07/2021   Pseudophakia 03/07/2021   Stroke ([place]) 03/24/2012   left sided weakness   Vitamin D deficiency    Past Surgical History:  Procedure Laterality Date   AV FISTULA PLACEMENT Right 02/08/2021   Procedure: RIGHT ARM ARTERIOVENOUS (AV) FISTULA CREATION;  Surgeon: [name], MD;  Location: [facility];  Service: Vascular;  Laterality: Right;   BIOPSY  11/12/2018   Procedure: BIOPSY;  Surgeon: Rogene Houston, MD;  Location: [facility];  Service: Endoscopy;;  colon  BIOPSY  05/10/2020   Procedure: BIOPSY;  Surgeon: Rogene Houston, MD;  Location: [facility];  Service: Endoscopy;;  esophagus   CATARACT EXTRACTION     right eye   CATARACT EXTRACTION [facility]  10/08/2012   Procedure: CATARACT EXTRACTION PHACO AND INTRAOCULAR LENS PLACEMENT ([place]);  Surgeon: Tonny Branch, MD;  Location: [facility];  Service: Ophthalmology;  Laterality: Left;  CDE:6.64    CHOLECYSTECTOMY     [place]   COLONOSCOPY N/A 11/12/2018   Procedure: COLONOSCOPY;  Surgeon: Rogene Houston, MD;  Location: [facility];  Service: Endoscopy;  Laterality: N/A;  1030   ELBOW FRACTURE SURGERY     left   ESOPHAGEAL DILATION N/A 11/12/2018   Procedure: ESOPHAGEAL DILATION;  Surgeon: Rogene Houston, MD;  [facility]: [facility];  Service: Endoscopy;  Laterality: N/A;   ESOPHAGEAL DILATION N/A 05/10/2020   Procedure: ESOPHAGEAL DILATION;  Surgeon: Rogene Houston, MD;  [facility];  Service: Endoscopy;  Laterality: N/A;   ESOPHAGOGASTRODUODENOSCOPY N/A 11/12/2018   Procedure: ESOPHAGOGASTRODUODENOSCOPY (EGD);  Surgeon: Rogene Houston, MD;  [facility];  Service: Endoscopy;  Laterality: N/A;   ESOPHAGOGASTRODUODENOSCOPY N/A 05/10/2020   Procedure: ESOPHAGOGASTRODUODENOSCOPY (EGD);  Surgeon: Rogene Houston, MD;  [facility];  Service: Endoscopy;  Laterality: N/A;  210   HERNIA REPAIR     right inguinal   HYDROCELE EXCISION / REPAIR     IR FLUORO GUIDE CV LINE LEFT  08/17/2021   IR US GUIDE VASC ACCESS LEFT  08/17/2021   LIGATION OF COMPETING BRANCHES OF ARTERIOVENOUS FISTULA Right 09/11/2021   Procedure: LIGATION OF COMPETING BRANCHES OF RIGHT ARM ARTERIOVENOUS FISTULA;  Surgeon: [name], MD;  Location: [facility];  Service: Vascular;  Laterality: Right;   POLYPECTOMY  11/12/2018   Procedure: POLYPECTOMY;  Surgeon: Rogene Houston, MD;  [facility];  Service: Endoscopy;;  colon    RETINAL DETACHMENT SURGERY Left 2019   SPLENECTOMY, TOTAL     TONSILLECTOMY     VOCAL CORD INJECTION     removal of polyp-2005    SOCIAL HISTORY:  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   Tobacco Use   Smoking status: Former    Packs/day: 3.00    Years: 30.00    Pack years: 90.00    Types: Cigarettes   Smokeless tobacco: Current   Tobacco comments:    Currently vape  Vaping Use   Vaping Use: Every day   Substances: Nicotine  Substance and Sexual Activity   Alcohol use: Yes    Alcohol/week: 1.0 - 2.0 standard drink    Types: 1 - 2 Cans of beer per week    Comment: Occasionally   Drug use: No   Sexual activity: Not Currently    Birth control/protection: Abstinence, None  Other Topics Concern   Not on file  Social History Narrative   2 children, both nearby   Social Determinants of Health   Financial Resource Strain: Low Risk    Difficulty of Paying Living Expenses: Not hard at all  Food Insecurity: No Food Insecurity   Worried About Charity fundraiser in the Last Year:  Never true   Ran Out of Food in the Last Year: Never true  Transportation Needs: No Transportation Needs   Lack of Transportation (Medical): No   Lack of Transportation (Non-Medical): No  Physical Activity: Insufficiently Active   Days of Exercise per Week: 3 days   Minutes of Exercise per Session: 30 min  Stress: No Stress Concern Present   Feeling of Stress : Only a little  Social Connections: Socially Isolated   Frequency of Communication with Friends and Family: More than three times a week   Frequency of Social Gatherings with Friends and Family: More than three times a week   Attends Religious Services: Never   Marine scientist or Organizations: No   Attends Music therapist: Never   Marital Status: Divorced  Human resources officer Violence: Not At Risk   Fear of Current or Ex-Partner: No   Emotionally Abused: No   Physically Abused: No   Sexually Abused: No    FAMILY HISTORY:  No family history on file.  CURRENT MEDICATIONS:  Current Outpatient Medications  Medication Sig Dispense Refill   albuterol (VENTOLIN HFA) 108 (90 Base) MCG/ACT inhaler Inhale 2  puffs into the lungs every 6 (six) hours as needed for wheezing or shortness of breath. 8 g 0   aspirin EC 81 MG tablet Take 1 tablet (81 mg total) by mouth daily. 30 tablet 11   bortezomib IV (VELCADE) 3.5 MG injection Inject 1.5 mg/m2 into the vein once a week.     budesonide-formoterol (SYMBICORT) 80-4.5 MCG/ACT inhaler Inhale 2 puffs into the lungs 2 (two) times daily as needed (shortness of breath).     calcitRIOL (ROCALTROL) 0.25 MCG capsule Take 0.25 mcg by mouth daily.     cholecalciferol (VITAMIN D3) 25 MCG (1000 UNIT) tablet Take 2,000 Units by mouth daily.     CYCLOPHOSPHAMIDE IV Inject 300 mg/m2 into the vein once a week.     dexamethasone (DECADRON) 4 MG tablet Take 2.5 tablets (10 mg total) by mouth once a week. Take at home along with Tylenol and Benadryl weekly on the day of daratumumab injection. 30 tablet 2   dexamethasone 40 mg in sodium chloride 0.9 % 50 mL Inject 40 mg into the vein once a week.     docusate sodium (COLACE) 100 MG capsule Take 100 mg by mouth daily.     gabapentin (NEURONTIN) 300 MG capsule Take 1 capsule (300 mg total) by mouth 2 (two) times daily. 60 capsule 0   midodrine (PROAMATINE) 10 MG tablet Take by mouth.     Omega-3 Fatty Acids (FISH OIL) 1000 MG CAPS Take 1,000 mg by mouth in the morning, at noon, and at bedtime.      oxyCODONE (OXY IR/ROXICODONE) 5 MG immediate release tablet Take 1 tablet (5 mg total) by mouth 2 (two) times daily as needed for severe pain. 30 tablet 0   pantoprazole (PROTONIX) 40 MG tablet Take 1 tablet (40 mg total) by mouth 2 (two) times daily. 180 tablet 3   polyethylene glycol (MIRALAX / GLYCOLAX) packet Take 17 g by mouth daily. Patient states that Ernest Barnett takes as needed. 30 each 5   pravastatin (PRAVACHOL) 80 MG tablet Take 1 tablet (80 mg total) by mouth daily. 90 tablet 3   ranolazine (RANEXA) 500 MG 12 hr tablet Take 1 tablet (500 mg total) by mouth daily. 90 tablet 3   sildenafil (REVATIO) 20 MG tablet Take 1 tablet (20 mg  total) by mouth 5 (five)  times daily as needed. 30 tablet 2   sodium bicarbonate 325 MG tablet Take 325 mg by mouth 3 (three) times daily.     tamsulosin (FLOMAX) 0.4 MG CAPS capsule Take 0.4 mg by mouth daily.      tiZANidine (ZANAFLEX) 2 MG tablet TAKE 1 TABLET BY MOUTH EVERY 6 HOURS AS NEEDED FOR MUSCLE SPASM 30 tablet 0   torsemide (DEMADEX) 100 MG tablet Take 100 mg by mouth 2 (two) times daily.     No current facility-administered medications for this visit.   Facility-Administered Medications Ordered in Other Visits  Medication Dose Route Frequency Provider Last Rate Last Admin   palonosetron (ALOXI) 0.25 MG/5ML injection             ALLERGIES:  No Known Allergies  PHYSICAL EXAM:  Performance status (ECOG): 1 - Symptomatic but completely ambulatory  There were no vitals filed for this visit. Wt Readings from Last 3 Encounters:  12/20/21 255 lb 12.8 oz (116 kg)  12/13/21 258 lb (117 kg)  11/29/21 255 lb 1.2 oz (115.7 kg)   Physical Exam Vitals reviewed.  Constitutional:      Appearance: Normal appearance. Ernest Barnett is obese.  Cardiovascular:     Rate and Rhythm: Normal rate and regular rhythm.     Pulses: Normal pulses.     Heart sounds: Normal heart sounds.  Pulmonary:     Effort: Pulmonary effort is normal.     Breath sounds: Normal breath sounds.  Musculoskeletal:     Right lower leg: 3+ Edema present.     Left lower leg: 3+ Edema present.  Neurological:     General: No focal deficit present.     Mental Status: Ernest Barnett is alert and oriented to person, place, and time.  Psychiatric:        Mood and Affect: Mood normal.        Behavior: Behavior normal.    LABORATORY DATA:  I have reviewed the labs as listed.  CBC Latest Ref Rng & Units 12/20/2021 12/13/2021 11/29/2021  WBC 4.0 - 10.5 K/uL 7.2 8.0 10.7(H)  Hemoglobin 13.0 - 17.0 g/dL 10.9(L) 11.9(L) 12.1(L)  Hematocrit 39.0 - 52.0 % 33.7(L) 35.7(L) 36.6(L)  Platelets 150 - 400 K/uL 197 216 235   CMP Latest Ref Rng &  Units 12/20/2021 12/13/2021 11/29/2021  Glucose 70 - 99 mg/dL 104(H) 98 105(H)  BUN 8 - 23 mg/dL 31(H) 23 36(H)  Creatinine 0.61 - 1.24 mg/dL 3.77(H) 3.86(H) 4.34(H)  Sodium 135 - 145 mmol/L 138 136 136  Potassium 3.5 - 5.1 mmol/L 3.8 4.0 3.8  Chloride 98 - 111 mmol/L 102 101 97(L)  CO2 22 - 32 mmol/L _0 Calcium 8.9 - 10.3 mg/dL 9.2 8.7(L) 9.4  Total Protein 6.5 - 8.1 g/dL 6.8 7.0 7.7  Total Bilirubin 0.3 - 1.2 mg/dL 0.6 0.5 0.4  Alkaline Phos 38 - 126 U/L 24(L) 23(L) 27(L)  AST 15 - 41 U/L _1 ALT 0 - 44 U/L _2 DIAGNOSTIC IMAGING:  I have independently reviewed the scans and discussed with the patient. No results found.   ASSESSMENT:  1.  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  Basis for CKD is hypertension associated arteriosclerosis with arterionephrosclerosis. - Bone marrow biopsy on 02/21/2021 with slightly hypercellular bone marrow with trilineage hematopoiesis.  Plasma cells increased in number representing 11% of  all cells with kappa light chain restriction. - Myeloma FISH panel with no evidence of T p53.  Plasma cell enrichment yielded limited cellularity, therefore only tested for T p53 probes at. - Chromosome analysis 45, X,- Y (8)/46, XY (12) - Labs on 02/01/2021-M spike 2 g, kappa light chain 794, free light chain ratio 49.97.  Beta-2 microglobulin 5.5, albumin 3.5.  LDH normal. - PET scan on 03/08/2021 with no evidence of active myeloma.  No soft tissue plasmacytoma. - 24-hour urine total protein 2.2 g on 03/13/2021.  Immunofixation positive for IgG kappa. - 4 cycles of CyBorD from 03/14/2021 through 07/04/2021 held due to fluid retention.  Myeloma labs on 08/08/2021 showed progression. - Daratumumab, Velcade and dexamethasone started on 08/30/2021.   2.  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  No exposure to  chemicals.  Smoked 2 packs/day for 32 years and quit smoking cigarettes and vaping now. -Mother had small cell lung cancer.   3.  CKD: -Ernest Barnett has stage V CKD thought to be secondary to hypertension. -Renal ultrasound on 12/19/2020 was negative for obstructive uropathy and findings were consistent with chronic medical renal disease.  Bilateral kidney cysts were seen. -Ernest Barnett has 2.7 g of proteinuria on 24-hour urine. -Kidney biopsy was done on 01/25/2021.   PLAN:  1.  Stage II, standard risk IgG kappa multiple myeloma: - Daratumumab, Velcade and dexamethasone started on 08/30/2021. - Reviewed myeloma labs from 12/20/2021.  M spike improved to 0.3 g from 0.4 g previously.  Kappa light chains are 48.6, stable from 48.7 at last visit.  Ratio is 5.34. - Denies any worsening of neuropathy from dose reduced Velcade. - Reviewed labs from today which shows normal LFTs and CBC. - Because of his fluid retention, I have recommended cutting back on dexamethasone 10 mg only on days of daratumumab which is every 2 weeks.  Ernest Barnett will continue Velcade 1 mg/m2 on days 1, 8 every 21 days. - RTC 6 weeks for follow-up with repeat myeloma labs 2 weeks prior.   2.  Smoking history: - CT chest on 02/15/2021 was lung RADS 1.   3.  ID prophylaxis: - Continue aspirin for thromboprophylaxis and acyclovir twice daily for shingles prophylaxis.   4.  Myeloma bone disease: - Last denosumab on 09/13/2021.  We are holding denosumab since dialysis started as more safety data unavailable in dialysis patients.  5.  Lower extremity swelling: - Continue torsemide 100 mg twice daily.  Ernest Barnett still has 3+ edema in the legs.  His weight is also going up. - We will cut back on dexamethasone to 10 mg only on days of daratumumab.  6.  Peripheral neuropathy: - Continue gabapentin which is helping.  7.  Low back pain: - Continue oxycodone and Tylenol as needed.  8.  ESRD on HD: - HD started on 08/22/2021, continues on M/W/F.   Orders  placed this encounter:  No orders of the defined types were placed in this encounter.    Derek Jack, MD [place] [phone]   I, Thana Ates, am acting as a scribe for Dr. Derek Jack.  I, Derek Jack MD, have reviewed the above documentation for accuracy and completeness, and I agree with the above.

## 2022-01-03 NOTE — Patient Instructions (Signed)
[facility] at [facility] ?Discharge Instructions ? ? ?You were seen and examined today by Dr. Katragadda. ? ?He reviewed your lab work.  You multiple myeloma numbers are improving.  You no longer need to take the steroid except on the days you get 2 shots. ? ?We will proceed with your treatment today. ? ?Return as scheduled.  ? ? ?Thank you for choosing [facility] to provide your oncology and hematology care.  To afford each patient quality time with our provider, please arrive at least 15 minutes before your scheduled appointment time.  ? ?If you have a lab appointment with the Cancer Center please come in thru the Main Entrance and check in at the main information desk. ? ?You need to re-schedule your appointment should you arrive 10 or more minutes late.  We strive to give you quality time with our providers, and arriving late affects you and other patients whose appointments are after yours.  Also, if you no show three or more times for appointments you may be dismissed from the clinic at the providers discretion.     ?Again, thank you for choosing [facility].  Our hope is that these requests will decrease the amount of time that you wait before being seen by our physicians.       ?_____________________________________________________________ ? ?Should you have questions after your visit to [facility], please contact our office at [phone] and follow the prompts.  Our office hours are 8:00 a.m. and 4:30 p.m. Monday - Friday.  Please note that voicemails left after 4:00 p.m. may not be returned until the following business day.  We are closed weekends and major holidays.  You do have access to a nurse 24-7, just call the main number to the clinic [phone] and do not press any options, hold on the line and a nurse will answer the phone.   ? ?For prescription refill requests, have your pharmacy contact our office and  allow 72 hours.   ? ?Due to Covid, you will need to wear a mask upon entering the hospital. If you do not have a mask, a mask will be given to you at the Main Entrance upon arrival. For doctor visits, patients may have 1 support person age 18 or older with them. For treatment visits, patients can not have anyone with them due to social distancing guidelines and our immunocompromised population.  ? ?   ?

## 2022-01-03 NOTE — Progress Notes (Signed)
Patient has been examined by Dr. Katragadda, and vital signs and labs have been reviewed. ANC, Creatinine, LFTs, hemoglobin, and platelets are within treatment parameters per M.D. - pt may proceed with treatment.    °

## 2022-01-03 NOTE — Progress Notes (Signed)
BERND CROM presents today for Velcade and Daratumumab injections per the provider's orders.  Stable during administration without incident; injection site WNL; see MAR for injection details.  Patient tolerated procedure well and without incident.  No questions or complaints noted at this time.  Discharge from clinic ambulatory in stable condition.  Alert and oriented X 3.  Follow up with [facility] as scheduled.  ?

## 2022-01-03 NOTE — Progress Notes (Signed)
Vitals reviewed with MD today. Ok to treat per MD.  ?

## 2022-01-03 NOTE — Patient Instructions (Signed)
[facility]  Discharge Instructions: ?Thank you for choosing [facility] to provide your oncology and hematology care.  ?If you have a lab appointment with the [facility], please come in thru the Main Entrance and check in at the main information desk. ? ?Wear comfortable clothing and clothing appropriate for easy access to any Portacath or PICC line.  ? ?We strive to give you quality time with your provider. You may need to reschedule your appointment if you arrive late (15 or more minutes).  Arriving late affects you and other patients whose appointments are after yours.  Also, if you miss three or more appointments without notifying the office, you may be dismissed from the clinic at the provider?s discretion.    ?  ?For prescription refill requests, have your pharmacy contact our office and allow 72 hours for refills to be completed.   ? ?Today you received the following chemotherapy and/or immunotherapy agents Velcade and Daratumamab    ?  ?To help prevent nausea and vomiting after your treatment, we encourage you to take your nausea medication as directed. ? ?BELOW ARE SYMPTOMS THAT SHOULD BE REPORTED IMMEDIATELY: ?*FEVER GREATER THAN 100.4 F (38 ?C) OR HIGHER ?*CHILLS OR SWEATING ?*NAUSEA AND VOMITING THAT IS NOT CONTROLLED WITH YOUR NAUSEA MEDICATION ?*UNUSUAL SHORTNESS OF BREATH ?*UNUSUAL BRUISING OR BLEEDING ?*URINARY PROBLEMS (pain or burning when urinating, or frequent urination) ?*BOWEL PROBLEMS (unusual diarrhea, constipation, pain near the anus) ?TENDERNESS IN MOUTH AND THROAT WITH OR WITHOUT PRESENCE OF ULCERS (sore throat, sores in mouth, or a toothache) ?UNUSUAL RASH, SWELLING OR PAIN  ?UNUSUAL VAGINAL DISCHARGE OR ITCHING  ? ?Items with * indicate a potential emergency and should be followed up as soon as possible or go to the Emergency Department if any problems should occur. ? ?Please show the CHEMOTHERAPY ALERT CARD or IMMUNOTHERAPY ALERT CARD at check-in to the  Emergency Department and triage nurse. ? ?Should you have questions after your visit or need to cancel or reschedule your appointment, please contact [facility] [phone]  and follow the prompts.  Office hours are 8:00 a.m. to 4:30 p.m. Monday - Friday. Please note that voicemails left after 4:00 p.m. may not be returned until the following business day.  We are closed weekends and major holidays. You have access to a nurse at all times for urgent questions. Please call the main number to the clinic [phone] and follow the prompts. ? ?For any non-urgent questions, you may also contact your provider using MyChart. We now offer e-Visits for anyone 37 and older to request care online for non-urgent symptoms. For details visit [email or web address]. ?  ?Also download the MyChart app! Go to the app store, search "MyChart", open the app, select [facility], and log in with your MyChart username and password. ? ?Due to Covid, a mask is required upon entering the hospital/clinic. If you do not have a mask, one will be given to you upon arrival. For doctor visits, patients may have 1 support person aged 15 or older with them. For treatment visits, patients cannot have anyone with them due to current Covid guidelines and our immunocompromised population.  ?

## 2022-01-04 DIAGNOSIS — Z992 Dependence on renal dialysis: Secondary | ICD-10-CM | POA: Diagnosis not present

## 2022-01-04 DIAGNOSIS — N186 End stage renal disease: Secondary | ICD-10-CM | POA: Diagnosis not present

## 2022-01-07 DIAGNOSIS — N186 End stage renal disease: Secondary | ICD-10-CM | POA: Diagnosis not present

## 2022-01-07 DIAGNOSIS — Z992 Dependence on renal dialysis: Secondary | ICD-10-CM | POA: Diagnosis not present

## 2022-01-09 DIAGNOSIS — Z992 Dependence on renal dialysis: Secondary | ICD-10-CM | POA: Diagnosis not present

## 2022-01-09 DIAGNOSIS — N186 End stage renal disease: Secondary | ICD-10-CM | POA: Diagnosis not present

## 2022-01-10 ENCOUNTER — Inpatient Hospital Stay (HOSPITAL_COMMUNITY): Payer: Medicare HMO

## 2022-01-10 ENCOUNTER — Other Ambulatory Visit: Payer: Self-pay

## 2022-01-10 ENCOUNTER — Encounter (HOSPITAL_COMMUNITY): Payer: Self-pay

## 2022-01-10 VITALS — BP 106/72 | HR 90 | Temp 97.8°F | Resp 18 | Wt 261.0 lb

## 2022-01-10 DIAGNOSIS — C9000 Multiple myeloma not having achieved remission: Secondary | ICD-10-CM | POA: Diagnosis not present

## 2022-01-10 DIAGNOSIS — D472 Monoclonal gammopathy: Secondary | ICD-10-CM

## 2022-01-10 DIAGNOSIS — Z5112 Encounter for antineoplastic immunotherapy: Secondary | ICD-10-CM | POA: Diagnosis not present

## 2022-01-10 LAB — CBC WITH DIFFERENTIAL/PLATELET
Abs Immature Granulocytes: 0.03 10*3/uL (ref 0.00–0.07)
Basophils Absolute: 0 10*3/uL (ref 0.0–0.1)
Basophils Relative: 1 %
Eosinophils Absolute: 0.1 10*3/uL (ref 0.0–0.5)
Eosinophils Relative: 1 %
HCT: 35.3 % — ABNORMAL LOW (ref 39.0–52.0)
Hemoglobin: 11.5 g/dL — ABNORMAL LOW (ref 13.0–17.0)
Immature Granulocytes: 0 %
Lymphocytes Relative: 39 %
Lymphs Abs: 3.3 10*3/uL (ref 0.7–4.0)
MCH: 32.1 pg (ref 26.0–34.0)
MCHC: 32.6 g/dL (ref 30.0–36.0)
MCV: 98.6 fL (ref 80.0–100.0)
Monocytes Absolute: 1 10*3/uL (ref 0.1–1.0)
Monocytes Relative: 12 %
Neutro Abs: 4 10*3/uL (ref 1.7–7.7)
Neutrophils Relative %: 47 %
Platelets: 162 10*3/uL (ref 150–400)
RBC: 3.58 MIL/uL — ABNORMAL LOW (ref 4.22–5.81)
RDW: 13.8 % (ref 11.5–15.5)
WBC: 8.4 10*3/uL (ref 4.0–10.5)
nRBC: 0 % (ref 0.0–0.2)

## 2022-01-10 LAB — MAGNESIUM: Magnesium: 2.1 mg/dL (ref 1.7–2.4)

## 2022-01-10 LAB — COMPREHENSIVE METABOLIC PANEL
ALT: 20 U/L (ref 0–44)
AST: 14 U/L — ABNORMAL LOW (ref 15–41)
Albumin: 4 g/dL (ref 3.5–5.0)
Alkaline Phosphatase: 25 U/L — ABNORMAL LOW (ref 38–126)
Anion gap: 12 (ref 5–15)
BUN: 36 mg/dL — ABNORMAL HIGH (ref 8–23)
CO2: 25 mmol/L (ref 22–32)
Calcium: 8.8 mg/dL — ABNORMAL LOW (ref 8.9–10.3)
Chloride: 99 mmol/L (ref 98–111)
Creatinine, Ser: 4.53 mg/dL — ABNORMAL HIGH (ref 0.61–1.24)
GFR, Estimated: 14 mL/min — ABNORMAL LOW (ref >60)
Glucose, Bld: 106 mg/dL — ABNORMAL HIGH (ref 70–99)
Potassium: 3.5 mmol/L (ref 3.5–5.1)
Sodium: 136 mmol/L (ref 135–145)
Total Bilirubin: 0.3 mg/dL (ref 0.3–1.2)
Total Protein: 7.2 g/dL (ref 6.5–8.1)

## 2022-01-10 MED ORDER — BORTEZOMIB CHEMO SQ INJECTION 3.5 MG (2.5MG/ML)
1.0000 mg/m2 | Freq: Once | INTRAMUSCULAR | Status: AC
  Administered 2022-01-10: 12:00:00 2.25 mg via SUBCUTANEOUS
  Filled 2022-01-10: qty 0.9

## 2022-01-10 NOTE — Progress Notes (Signed)
Patient presents today for treatment . Vital signs stable. Patient has no complaints of any changes since his last visit. Patient denies any side effects from his treatment. Labs pending.  ? ?Treatment given today per MD orders. Tolerated without adverse affects. Vital signs stable. No complaints at this time. Discharged from clinic ambulatory in stable condition. Alert and oriented x 3. F/U with [facility] as scheduled.   ?

## 2022-01-10 NOTE — Patient Instructions (Signed)
[place]  Discharge Instructions: ?Thank you for choosing [facility] to provide your oncology and hematology care.  ?If you have a lab appointment with the [facility], please come in thru the Main Entrance and check in at the main information desk. ? ?Wear comfortable clothing and clothing appropriate for easy access to any Portacath or PICC line.  ? ?We strive to give you quality time with your provider. You may need to reschedule your appointment if you arrive late (15 or more minutes).  Arriving late affects you and other patients whose appointments are after yours.  Also, if you miss three or more appointments without notifying the office, you may be dismissed from the clinic at the provider?s discretion.    ?  ?For prescription refill requests, have your pharmacy contact our office and allow 72 hours for refills to be completed.   ? ?Today you received the following chemotherapy and/or immunotherapy agents Velcade injection.    ?  ?To help prevent nausea and vomiting after your treatment, we encourage you to take your nausea medication as directed. ? ?BELOW ARE SYMPTOMS THAT SHOULD BE REPORTED IMMEDIATELY: ?*FEVER GREATER THAN 100.4 F (38 ?C) OR HIGHER ?*CHILLS OR SWEATING ?*NAUSEA AND VOMITING THAT IS NOT CONTROLLED WITH YOUR NAUSEA MEDICATION ?*UNUSUAL SHORTNESS OF BREATH ?*UNUSUAL BRUISING OR BLEEDING ?*URINARY PROBLEMS (pain or burning when urinating, or frequent urination) ?*BOWEL PROBLEMS (unusual diarrhea, constipation, pain near the anus) ?TENDERNESS IN MOUTH AND THROAT WITH OR WITHOUT PRESENCE OF ULCERS (sore throat, sores in mouth, or a toothache) ?UNUSUAL RASH, SWELLING OR PAIN  ?UNUSUAL VAGINAL DISCHARGE OR ITCHING  ? ?Items with * indicate a potential emergency and should be followed up as soon as possible or go to the Emergency Department if any problems should occur. ? ?Please show the CHEMOTHERAPY ALERT CARD or IMMUNOTHERAPY ALERT CARD at check-in to the  Emergency Department and triage nurse. ? ?Should you have questions after your visit or need to cancel or reschedule your appointment, please contact [facility] [phone]  and follow the prompts.  Office hours are 8:00 a.m. to 4:30 p.m. Monday - Friday. Please note that voicemails left after 4:00 p.m. may not be returned until the following business day.  We are closed weekends and major holidays. You have access to a nurse at all times for urgent questions. Please call the main number to the clinic [phone] and follow the prompts. ? ?For any non-urgent questions, you may also contact your provider using MyChart. We now offer e-Visits for anyone 64 and older to request care online for non-urgent symptoms. For details visit [email or web address]. ?  ?Also download the MyChart app! Go to the app store, search "MyChart", open the app, select [facility], and log in with your MyChart username and password. ? ?Due to Covid, a mask is required upon entering the hospital/clinic. If you do not have a mask, one will be given to you upon arrival. For doctor visits, patients may have 1 support person aged 46 or older with them. For treatment visits, patients cannot have anyone with them due to current Covid guidelines and our immunocompromised population.  ?

## 2022-01-14 DIAGNOSIS — N186 End stage renal disease: Secondary | ICD-10-CM | POA: Diagnosis not present

## 2022-01-14 DIAGNOSIS — Z992 Dependence on renal dialysis: Secondary | ICD-10-CM | POA: Diagnosis not present

## 2022-01-14 DIAGNOSIS — N2581 Secondary hyperparathyroidism of renal origin: Secondary | ICD-10-CM | POA: Diagnosis not present

## 2022-01-15 ENCOUNTER — Ambulatory Visit (INDEPENDENT_AMBULATORY_CARE_PROVIDER_SITE_OTHER): Payer: Medicare HMO | Admitting: Medical

## 2022-01-15 ENCOUNTER — Encounter: Payer: Self-pay | Admitting: Medical

## 2022-01-15 VITALS — BP 118/84 | HR 118 | Ht 66.5 in | Wt 266.0 lb

## 2022-01-15 DIAGNOSIS — J449 Chronic obstructive pulmonary disease, unspecified: Secondary | ICD-10-CM

## 2022-01-15 DIAGNOSIS — R0602 Shortness of breath: Secondary | ICD-10-CM

## 2022-01-15 DIAGNOSIS — N186 End stage renal disease: Secondary | ICD-10-CM

## 2022-01-15 DIAGNOSIS — R9439 Abnormal result of other cardiovascular function study: Secondary | ICD-10-CM

## 2022-01-15 DIAGNOSIS — Z992 Dependence on renal dialysis: Secondary | ICD-10-CM

## 2022-01-15 NOTE — Progress Notes (Signed)
?Cardiology Office Note:   ? ?Date:  01/15/2022  ? ?ID:  Ernest Barnett, DOB [birth year], MRN [id number] ? ?PCP:  Lindell Spar, MD  ?[facility] Cardiologist:  Carlyle Dolly, MD  ?[facility] Electrophysiologist:  None  ? ?Referring MD: Lindell Spar, MD  ? ?Chief Complaint: 2 month follow-up ? ?History of Present Illness:   ? ?Ernest Barnett is a 66 y.o. male with a hx of CKD stage 5 on HD, prior CVA, COPD who presents for follow-up.  ? ?Seen by Dr. Harl Bowie 07/05/21 for LLE and SOB. Echo/limited echo ordered which showed LVEF 50-55%, apical HK, normal RV. He had subsequent Myoview stress test that showed findings consistent with ischemia, low risk, EF 60%.  ? ?Today, the patient reports chronic shortness of breath. He has COPD. He vapes, he quit smoking. Not interested in quitting. No chest pain. He started HD in October 2022. He does MWF. He makes urine and takes torsemide '100mg'$  BID. Heart rate elevated to 110, he denies palpitations. Appears elevated heart rate is chronic for him.  ? ?Past Medical History:  ?Diagnosis Date  ? Chronic kidney disease   ? COPD (chronic obstructive pulmonary disease) ([place])   ? CVA (cerebral vascular accident) [facility]) 2013  ? Cystoid macular edema of left eye 03/07/2021  ? GERD (gastroesophageal reflux disease)   ? HOH (hard of hearing)   ? Hypercholesteremia   ? Hypertension   ? Left epiretinal membrane 03/07/2021  ? Pseudophakia 03/07/2021  ? Stroke ([place]) 03/24/2012  ? left sided weakness  ? Vitamin D deficiency   ? ? ?Past Surgical History:  ?Procedure Laterality Date  ? AV FISTULA PLACEMENT Right 02/08/2021  ? Procedure: RIGHT ARM ARTERIOVENOUS (AV) FISTULA CREATION;  Surgeon: [name], MD;  Location: [facility] ORS;  Service: Vascular;  Laterality: Right;  ? BIOPSY  11/12/2018  ? Procedure: BIOPSY;  Surgeon: Rogene Houston, MD;  Location: [facility] ENDO SUITE;  Service: Endoscopy;;  colon  ? BIOPSY  05/10/2020  ? Procedure: BIOPSY;  Surgeon: Rogene Houston, MD;  Location: [facility] ENDO SUITE;   Service: Endoscopy;;  esophagus  ? CATARACT EXTRACTION    ? right eye  ? CATARACT EXTRACTION W/PHACO  10/08/2012  ? Procedure: CATARACT EXTRACTION PHACO AND INTRAOCULAR LENS PLACEMENT (IOC);  Surgeon: Tonny Branch, MD;  Location: [facility] ORS;  Service: Ophthalmology;  Laterality: Left;  CDE:6.64  ? CHOLECYSTECTOMY    ? [place]  ? COLONOSCOPY N/A 11/12/2018  ? Procedure: COLONOSCOPY;  Surgeon: Rogene Houston, MD;  Location: [facility] ENDO SUITE;  Service: Endoscopy;  Laterality: N/A;  1030  ? ELBOW FRACTURE SURGERY    ? left  ? ESOPHAGEAL DILATION N/A 11/12/2018  ? Procedure: ESOPHAGEAL DILATION;  Surgeon: Rogene Houston, MD;  Location: [facility] ENDO SUITE;  Service: Endoscopy;  Laterality: N/A;  ? ESOPHAGEAL DILATION N/A 05/10/2020  ? Procedure: ESOPHAGEAL DILATION;  Surgeon: Rogene Houston, MD;  Location: [facility] END[facility] SU[facility];  Service: Endoscopy;  Laterality: N/A;  ? ESOPHAGOGASTRODUODENOSCOPY N/A 11/12/2018  ? Procedure: ESOPHAGOGASTRODUODENOSCOPY (EGD);  Surgeon: Rogene Houston, MD;  Location: [facility];  Service: Endoscopy;  Laterality: N/A;  ? ESOPHAGOGASTRODUODENOSCOPY N/A 05/10/2020  ? Procedure: ESOPHAGOGASTRODUODENOSCOPY (EGD);  Surgeon: Rogene Houston, MD;  Location: [facility] END[facility];  Service: Endoscopy;  Laterality: N/A;  210  ? HERNIA REPAIR    ? right inguinal  ? HYDROCELE EXCISION / REPAIR    ? IR FLUORO GUIDE CV LINE LEFT  08/17/2021  ? IR US  GUIDE VASC ACCESS LEFT  08/17/2021  ? LIGATION OF COMPETING BRANCHES OF ARTERIOVENOUS FISTULA Right 09/11/2021  ? Procedure: LIGATION OF COMPETING BRANCHES OF RIGHT ARM ARTERIOVENOUS FISTULA;  Surgeon: [name], MD;  Location: [facility] ORS;  Service: Vascular;  Laterality: Right;  ? POLYPECTOMY  11/12/2018  ? Procedure: POLYPECTOMY;  Surgeon: Rogene Houston, MD;  Location: [facility];  Service: Endoscopy;;  colon ?  ? RETINAL DETACHMENT SURGERY Left 2019  ? SPLENECTOMY, TOTAL    ? TONSILLECTOMY    ? VOCAL CORD INJECTION    ? removal of polyp-2005  ? ? ?Current Medications: ?Current Meds   ?Medication Sig  ? albuterol (VENTOLIN HFA) 108 (90 Base) MCG/ACT inhaler Inhale 2 puffs into the lungs every 6 (six) hours as needed for wheezing or shortness of breath.  ? aspirin EC 81 MG tablet Take 1 tablet (81 mg total) by mouth daily.  ? bortezomib IV (VELCADE) 3.5 MG injection Inject 1.5 mg/m2 into the vein once a week.  ? budesonide-formoterol (SYMBICORT) 80-4.5 MCG/ACT inhaler Inhale 2 puffs into the lungs 2 (two) times daily as needed (shortness of breath).  ? cholecalciferol (VITAMIN D3) 25 MCG (1000 UNIT) tablet Take 2,000 Units by mouth daily.  ? CYCLOPHOSPHAMIDE IV Inject 300 mg/m2 into the vein once a week.  ? dexamethasone (DECADRON) 4 MG tablet Take 2.5 tablets (10 mg total) by mouth once a week. Take at home along with Tylenol and Benadryl weekly on the day of daratumumab injection.  ? docusate sodium (COLACE) 100 MG capsule Take 100 mg by mouth daily.  ? gabapentin (NEURONTIN) 300 MG capsule Take 1 capsule (300 mg total) by mouth 2 (two) times daily.  ? midodrine (PROAMATINE) 10 MG tablet Take by mouth.  ? Omega-3 Fatty Acids (FISH OIL) 1000 MG CAPS Take 1,000 mg by mouth in the morning, at noon, and at bedtime.   ? oxyCODONE (OXY IR/ROXICODONE) 5 MG immediate release tablet Take 1 tablet (5 mg total) by mouth 2 (two) times daily as needed for severe pain.  ? pantoprazole (PROTONIX) 40 MG tablet Take 1 tablet (40 mg total) by mouth 2 (two) times daily.  ? polyethylene glycol (MIRALAX / GLYCOLAX) packet Take 17 g by mouth daily. Patient states that he takes as needed.  ? pravastatin (PRAVACHOL) 80 MG tablet Take 1 tablet (80 mg total) by mouth daily.  ? ranolazine (RANEXA) 500 MG 12 hr tablet Take 1 tablet (500 mg total) by mouth daily.  ? sildenafil (REVATIO) 20 MG tablet Take 1 tablet (20 mg total) by mouth 5 (five) times daily as needed.  ? sodium bicarbonate 325 MG tablet Take 325 mg by mouth 3 (three) times daily.  ? tamsulosin (FLOMAX) 0.4 MG CAPS capsule Take 0.4 mg by mouth daily.   ?  tiZANidine (ZANAFLEX) 2 MG tablet TAKE 1 TABLET BY MOUTH EVERY 6 HOURS AS NEEDED FOR MUSCLE SPASM  ? torsemide (DEMADEX) 100 MG tablet Take 100 mg by mouth 2 (two) times daily.  ?  ? ?Allergies:   Patient has no known allergies.  ? ?Social History  ? ?Socioeconomic History  ? Marital status: Significant Other  ?  Spouse name: Not on file  ? Number of children: 2  ? Years of education: Not on file  ? Highest education level: Not on file  ?Occupational History  ? Occupation: Disability since having a stroke  ?  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 Smoking status: Former  ?  Packs/day: 3.00  ?  Years: 30.00  ?  Pack years: 90.00  ?  Types: Cigarettes  ? Smokeless tobacco: Current  ? Tobacco comments:  ?  Currently vape  ?Vaping Use  ? Vaping Use: Every day  ? Substances: Nicotine  ?Substance and Sexual Activity  ? Alcohol use: Yes  ?  Alcohol/week: 1.0 - 2.0 standard drink  ?  Types: 1 - 2 Cans of beer per week  ?  Comment: Occasionally  ? Drug use: No  ? Sexual activity: Not Currently  ?  Birth control/protection: Abstinence, None  ?Other Topics Concern  ? Not on file  ?Social History Narrative  ? 2 children, both nearby  ? ?Social Determinants of Health  ? ?Financial Resource Strain: Low Risk   ? Difficulty of Paying Living Expenses: Not hard at all  ?Food Insecurity: No Food Insecurity  ? Worried About Charity fundraiser in the Last Year: Never true  ? Ran Out of Food in the Last Year: Never true  ?Transportation Needs: No Transportation Needs  ? Lack of Transportation (Medical): No  ? Lack of Transportation (Non-Medical): No  ?Physical Activity: Insufficiently Active  ? Days of Exercise per Week: 3 days  ? Minutes of Exercise per Session: 30 min  ?Stress: No Stress Concern Present  ? Feeling of Stress : Only a little  ?Social Connections: Socially Isolated  ? Frequency of Communication with Friends and Family: More than three times a week  ? Frequency of Social Gatherings with Friends and Family: More than  three times a week  ? Attends Religious Services: Never  ? Active Member of Clubs or Organizations: No  ? Attends Archivist Meetings: Never  ? Marital Status: Divorced  ?  ? ?Family History: ?The pa

## 2022-01-15 NOTE — Patient Instructions (Addendum)
Medication Instructions: ?Your physician recommends that you continue on your current medications as directed. Please refer to the Current Medication list given to you today. ? ? ?Labwork: ?None today ? ?Testing/Procedures: ?None today ? ?Follow-Up: ?3 months ? ?Any Other Special Instructions Will Be Listed Below (If Applicable). ? ? ?You have been referred to Pulmonology for sleep study. They will call you to schedule an appointment. ? ? ?If you need a refill on your cardiac medications before your next appointment, please call your pharmacy. ? ?

## 2022-01-16 DIAGNOSIS — Z992 Dependence on renal dialysis: Secondary | ICD-10-CM | POA: Diagnosis not present

## 2022-01-16 DIAGNOSIS — N186 End stage renal disease: Secondary | ICD-10-CM | POA: Diagnosis not present

## 2022-01-17 ENCOUNTER — Other Ambulatory Visit: Payer: Self-pay

## 2022-01-18 DIAGNOSIS — N186 End stage renal disease: Secondary | ICD-10-CM | POA: Diagnosis not present

## 2022-01-18 DIAGNOSIS — Z992 Dependence on renal dialysis: Secondary | ICD-10-CM | POA: Diagnosis not present

## 2022-01-21 DIAGNOSIS — N186 End stage renal disease: Secondary | ICD-10-CM | POA: Diagnosis not present

## 2022-01-21 DIAGNOSIS — Z992 Dependence on renal dialysis: Secondary | ICD-10-CM | POA: Diagnosis not present

## 2022-01-22 ENCOUNTER — Other Ambulatory Visit: Payer: Self-pay

## 2022-01-22 ENCOUNTER — Telehealth: Payer: Self-pay

## 2022-01-22 NOTE — Telephone Encounter (Signed)
Received referral requesting fistulogram due to difficult cannulations and high risk for infiltration. To scheduling  ?

## 2022-01-23 DIAGNOSIS — N186 End stage renal disease: Secondary | ICD-10-CM | POA: Diagnosis not present

## 2022-01-23 DIAGNOSIS — Z992 Dependence on renal dialysis: Secondary | ICD-10-CM | POA: Diagnosis not present

## 2022-01-24 ENCOUNTER — Inpatient Hospital Stay (HOSPITAL_COMMUNITY): Payer: Medicare HMO

## 2022-01-24 ENCOUNTER — Other Ambulatory Visit: Payer: Self-pay

## 2022-01-24 VITALS — BP 103/66 | HR 98 | Temp 98.1°F | Resp 20 | Wt 262.6 lb

## 2022-01-24 DIAGNOSIS — C9000 Multiple myeloma not having achieved remission: Secondary | ICD-10-CM

## 2022-01-24 DIAGNOSIS — D472 Monoclonal gammopathy: Secondary | ICD-10-CM

## 2022-01-24 DIAGNOSIS — Z5112 Encounter for antineoplastic immunotherapy: Secondary | ICD-10-CM | POA: Diagnosis not present

## 2022-01-24 LAB — MAGNESIUM: Magnesium: 2 mg/dL (ref 1.7–2.4)

## 2022-01-24 LAB — COMPREHENSIVE METABOLIC PANEL
ALT: 19 U/L (ref 0–44)
AST: 15 U/L (ref 15–41)
Albumin: 4 g/dL (ref 3.5–5.0)
Alkaline Phosphatase: 25 U/L — ABNORMAL LOW (ref 38–126)
Anion gap: 12 (ref 5–15)
BUN: 35 mg/dL — ABNORMAL HIGH (ref 8–23)
CO2: 24 mmol/L (ref 22–32)
Calcium: 8.7 mg/dL — ABNORMAL LOW (ref 8.9–10.3)
Chloride: 98 mmol/L (ref 98–111)
Creatinine, Ser: 5.05 mg/dL — ABNORMAL HIGH (ref 0.61–1.24)
GFR, Estimated: 12 mL/min — ABNORMAL LOW (ref >60)
Glucose, Bld: 138 mg/dL — ABNORMAL HIGH (ref 70–99)
Potassium: 3.4 mmol/L — ABNORMAL LOW (ref 3.5–5.1)
Sodium: 134 mmol/L — ABNORMAL LOW (ref 135–145)
Total Bilirubin: 0.6 mg/dL (ref 0.3–1.2)
Total Protein: 7.3 g/dL (ref 6.5–8.1)

## 2022-01-24 LAB — CBC WITH DIFFERENTIAL/PLATELET
Abs Immature Granulocytes: 0.01 10*3/uL (ref 0.00–0.07)
Basophils Absolute: 0.1 10*3/uL (ref 0.0–0.1)
Basophils Relative: 1 %
Eosinophils Absolute: 0.1 10*3/uL (ref 0.0–0.5)
Eosinophils Relative: 1 %
HCT: 34.2 % — ABNORMAL LOW (ref 39.0–52.0)
Hemoglobin: 11 g/dL — ABNORMAL LOW (ref 13.0–17.0)
Immature Granulocytes: 0 %
Lymphocytes Relative: 38 %
Lymphs Abs: 2.7 10*3/uL (ref 0.7–4.0)
MCH: 30.5 pg (ref 26.0–34.0)
MCHC: 32.2 g/dL (ref 30.0–36.0)
MCV: 94.7 fL (ref 80.0–100.0)
Monocytes Absolute: 0.8 10*3/uL (ref 0.1–1.0)
Monocytes Relative: 11 %
Neutro Abs: 3.3 10*3/uL (ref 1.7–7.7)
Neutrophils Relative %: 49 %
Platelets: 224 10*3/uL (ref 150–400)
RBC: 3.61 MIL/uL — ABNORMAL LOW (ref 4.22–5.81)
RDW: 13.6 % (ref 11.5–15.5)
WBC: 7 10*3/uL (ref 4.0–10.5)
nRBC: 0 % (ref 0.0–0.2)

## 2022-01-24 MED ORDER — BORTEZOMIB CHEMO SQ INJECTION 3.5 MG (2.5MG/ML)
1.0000 mg/m2 | Freq: Once | INTRAMUSCULAR | Status: AC
  Administered 2022-01-24: 2.25 mg via SUBCUTANEOUS
  Filled 2022-01-24: qty 0.9

## 2022-01-24 MED ORDER — DARATUMUMAB-HYALURONIDASE-FIHJ 1800-30000 MG-UT/15ML ~~LOC~~ SOLN
1800.0000 mg | Freq: Once | SUBCUTANEOUS | Status: AC
  Administered 2022-01-24: 1800 mg via SUBCUTANEOUS
  Filled 2022-01-24: qty 15

## 2022-01-24 NOTE — Progress Notes (Signed)
Patient presents today for Velcade and Daratumumab injections pre providers order.  Labs and vitals within parameters for treatment. ? ?Patient has no new complaints at this time. ? ?Stable during administration without incident; injection site WNL; see MAR for injection details.  Patient tolerated procedure well and without incident.  No questions or complaints noted at this time. Discharge from clinic via wheelchair in stable condition.  Alert and oriented X 3.  Follow up with [facility] as scheduled.  ?

## 2022-01-24 NOTE — Patient Instructions (Signed)
[place]  Discharge Instructions: ?Thank you for choosing [facility] to provide your oncology and hematology care.  ?If you have a lab appointment with the [facility], please come in thru the Main Entrance and check in at the main information desk. ? ?Wear comfortable clothing and clothing appropriate for easy access to any Portacath or PICC line.  ? ?We strive to give you quality time with your provider. You may need to reschedule your appointment if you arrive late (15 or more minutes).  Arriving late affects you and other patients whose appointments are after yours.  Also, if you miss three or more appointments without notifying the office, you may be dismissed from the clinic at the provider?s discretion.    ?  ?For prescription refill requests, have your pharmacy contact our office and allow 72 hours for refills to be completed.   ? ?Today you received the following chemotherapy and/or immunotherapy agents Velcade and Darartumamab injections    ?  ?To help prevent nausea and vomiting after your treatment, we encourage you to take your nausea medication as directed. ? ?BELOW ARE SYMPTOMS THAT SHOULD BE REPORTED IMMEDIATELY: ?*FEVER GREATER THAN 100.4 F (38 ?C) OR HIGHER ?*CHILLS OR SWEATING ?*NAUSEA AND VOMITING THAT IS NOT CONTROLLED WITH YOUR NAUSEA MEDICATION ?*UNUSUAL SHORTNESS OF BREATH ?*UNUSUAL BRUISING OR BLEEDING ?*URINARY PROBLEMS (pain or burning when urinating, or frequent urination) ?*BOWEL PROBLEMS (unusual diarrhea, constipation, pain near the anus) ?TENDERNESS IN MOUTH AND THROAT WITH OR WITHOUT PRESENCE OF ULCERS (sore throat, sores in mouth, or a toothache) ?UNUSUAL RASH, SWELLING OR PAIN  ?UNUSUAL VAGINAL DISCHARGE OR ITCHING  ? ?Items with * indicate a potential emergency and should be followed up as soon as possible or go to the Emergency Department if any problems should occur. ? ?Please show the CHEMOTHERAPY ALERT CARD or IMMUNOTHERAPY ALERT CARD at  check-in to the Emergency Department and triage nurse. ? ?Should you have questions after your visit or need to cancel or reschedule your appointment, please contact [facility] [phone]  and follow the prompts.  Office hours are 8:00 a.m. to 4:30 p.m. Monday - Friday. Please note that voicemails left after 4:00 p.m. may not be returned until the following business day.  We are closed weekends and major holidays. You have access to a nurse at all times for urgent questions. Please call the main number to the clinic [phone] and follow the prompts. ? ?For any non-urgent questions, you may also contact your provider using MyChart. We now offer e-Visits for anyone 96 and older to request care online for non-urgent symptoms. For details visit [email or web address]. ?  ?Also download the MyChart app! Go to the app store, search "MyChart", open the app, select [facility], and log in with your MyChart username and password. ? ?Due to Covid, a mask is required upon entering the hospital/clinic. If you do not have a mask, one will be given to you upon arrival. For doctor visits, patients may have 1 support person aged 19 or older with them. For treatment visits, patients cannot have anyone with them due to current Covid guidelines and our immunocompromised population.  ?

## 2022-01-25 DIAGNOSIS — N186 End stage renal disease: Secondary | ICD-10-CM | POA: Diagnosis not present

## 2022-01-25 DIAGNOSIS — Z992 Dependence on renal dialysis: Secondary | ICD-10-CM | POA: Diagnosis not present

## 2022-01-28 DIAGNOSIS — Z992 Dependence on renal dialysis: Secondary | ICD-10-CM | POA: Diagnosis not present

## 2022-01-28 DIAGNOSIS — D509 Iron deficiency anemia, unspecified: Secondary | ICD-10-CM | POA: Diagnosis not present

## 2022-01-28 DIAGNOSIS — N186 End stage renal disease: Secondary | ICD-10-CM | POA: Diagnosis not present

## 2022-01-29 ENCOUNTER — Other Ambulatory Visit: Payer: Self-pay

## 2022-01-29 ENCOUNTER — Encounter (HOSPITAL_COMMUNITY): Payer: Self-pay | Admitting: Surgery

## 2022-01-29 ENCOUNTER — Encounter (HOSPITAL_COMMUNITY)
Admission: RE | Disposition: A | Discharge status: Home / Self Care | Payer: Self-pay | Source: Home / Self Care | Attending: Surgery

## 2022-01-29 ENCOUNTER — Ambulatory Visit (HOSPITAL_COMMUNITY)
Admission: RE | Admit: 2022-01-29 | Discharge: 2022-01-29 | Disposition: A | Discharge status: Home / Self Care | Payer: Medicare HMO | Attending: Surgery | Admitting: Surgery

## 2022-01-29 DIAGNOSIS — Z8673 Personal history of transient ischemic attack (TIA), and cerebral infarction without residual deficits: Secondary | ICD-10-CM | POA: Diagnosis not present

## 2022-01-29 DIAGNOSIS — T82898A Other specified complication of vascular prosthetic devices, implants and grafts, initial encounter: Secondary | ICD-10-CM | POA: Diagnosis not present

## 2022-01-29 DIAGNOSIS — J449 Chronic obstructive pulmonary disease, unspecified: Secondary | ICD-10-CM | POA: Insufficient documentation

## 2022-01-29 DIAGNOSIS — N186 End stage renal disease: Secondary | ICD-10-CM | POA: Diagnosis not present

## 2022-01-29 DIAGNOSIS — I120 Hypertensive chronic kidney disease with stage 5 chronic kidney disease or end stage renal disease: Secondary | ICD-10-CM | POA: Diagnosis not present

## 2022-01-29 DIAGNOSIS — T82858A Stenosis of vascular prosthetic devices, implants and grafts, initial encounter: Secondary | ICD-10-CM | POA: Insufficient documentation

## 2022-01-29 DIAGNOSIS — Z992 Dependence on renal dialysis: Secondary | ICD-10-CM | POA: Insufficient documentation

## 2022-01-29 DIAGNOSIS — Z87891 Personal history of nicotine dependence: Secondary | ICD-10-CM | POA: Insufficient documentation

## 2022-01-29 DIAGNOSIS — Y841 Kidney dialysis as the cause of abnormal reaction of the patient, or of later complication, without mention of misadventure at the time of the procedure: Secondary | ICD-10-CM | POA: Insufficient documentation

## 2022-01-29 HISTORY — PX: PERIPHERAL VASCULAR BALLOON ANGIOPLASTY: CATH118281

## 2022-01-29 HISTORY — PX: A/V FISTULAGRAM: CATH118298

## 2022-01-29 LAB — POCT I-STAT, CHEM 8
BUN: 48 mg/dL — ABNORMAL HIGH (ref 8–23)
Calcium, Ion: 1.07 mmol/L — ABNORMAL LOW (ref 1.15–1.40)
Chloride: 105 mmol/L (ref 98–111)
Creatinine, Ser: 4.4 mg/dL — ABNORMAL HIGH (ref 0.61–1.24)
Glucose, Bld: 90 mg/dL (ref 70–99)
HCT: 35 % — ABNORMAL LOW (ref 39.0–52.0)
Hemoglobin: 11.9 g/dL — ABNORMAL LOW (ref 13.0–17.0)
Potassium: 5.6 mmol/L — ABNORMAL HIGH (ref 3.5–5.1)
Sodium: 136 mmol/L (ref 135–145)
TCO2: 26 mmol/L (ref 22–32)

## 2022-01-29 SURGERY — A/V FISTULAGRAM
Anesthesia: LOCAL | Laterality: Right

## 2022-01-29 MED ORDER — SODIUM CHLORIDE 0.9% FLUSH: 3.0000 mL | INTRAVENOUS | Status: DC | PRN

## 2022-01-29 MED ORDER — FENTANYL CITRATE (PF) 100 MCG/2ML IJ SOLN
INTRAMUSCULAR | Status: DC | PRN
  Administered 2022-01-29: 25 ug via INTRAVENOUS

## 2022-01-29 MED ORDER — HEPARIN (PORCINE) IN NACL 1000-0.9 UT/500ML-% IV SOLN
INTRAVENOUS | Status: AC
  Filled 2022-01-29: qty 500

## 2022-01-29 MED ORDER — HEPARIN SODIUM (PORCINE) 1000 UNIT/ML IJ SOLN
INTRAMUSCULAR | Status: DC | PRN
  Administered 2022-01-29: 3000 [IU] via INTRAVENOUS

## 2022-01-29 MED ORDER — SODIUM CHLORIDE 0.9 % IV SOLN: 250.0000 mL | INTRAVENOUS | Status: DC | PRN

## 2022-01-29 MED ORDER — SODIUM CHLORIDE 0.9% FLUSH: 3.0000 mL | Freq: Two times a day (BID) | INTRAVENOUS | Status: DC

## 2022-01-29 MED ORDER — HEPARIN (PORCINE) IN NACL 1000-0.9 UT/500ML-% IV SOLN
INTRAVENOUS | Status: DC | PRN
  Administered 2022-01-29: 500 mL

## 2022-01-29 MED ORDER — MIDAZOLAM HCL 2 MG/2ML IJ SOLN
INTRAMUSCULAR | Status: DC | PRN
  Administered 2022-01-29: 1 mg via INTRAVENOUS

## 2022-01-29 MED ORDER — MIDAZOLAM HCL 2 MG/2ML IJ SOLN
INTRAMUSCULAR | Status: AC
  Filled 2022-01-29: qty 2

## 2022-01-29 MED ORDER — HEPARIN SODIUM (PORCINE) 1000 UNIT/ML IJ SOLN
INTRAMUSCULAR | Status: AC
  Filled 2022-01-29: qty 10

## 2022-01-29 MED ORDER — FENTANYL CITRATE (PF) 100 MCG/2ML IJ SOLN
INTRAMUSCULAR | Status: AC
  Filled 2022-01-29: qty 2

## 2022-01-29 MED ORDER — IODIXANOL 320 MG/ML IV SOLN
INTRAVENOUS | Status: DC | PRN
  Administered 2022-01-29: 75 mL

## 2022-01-29 MED ORDER — LIDOCAINE HCL (PF) 1 % IJ SOLN
INTRAMUSCULAR | Status: DC | PRN
  Administered 2022-01-29 (×2): 2 mL

## 2022-01-29 MED ORDER — LIDOCAINE HCL (PF) 1 % IJ SOLN
INTRAMUSCULAR | Status: AC
  Filled 2022-01-29: qty 30

## 2022-01-29 SURGICAL SUPPLY — 22 items
BAG SNAP BAND KOVER 36X36 (MISCELLANEOUS) ×2 IMPLANT
BALLN STERLING OTW 6X100X135 (BALLOONS) ×2
BALLN VIATRAC 6X20X135 (BALLOONS) ×2
BALLOON STERLING OTW 6X100X135 (BALLOONS) IMPLANT
BALLOON VIATRAC 6X20X135 (BALLOONS) IMPLANT
CATH CXI 2.6F 65 ANG (CATHETERS) ×2
CATH SPRT ANG 65X2.3FR PLATN (CATHETERS) IMPLANT
COVER DOME SNAP 22 D (MISCELLANEOUS) ×2 IMPLANT
DCB RANGER 5.0X80 135 (BALLOONS) IMPLANT
KIT ENCORE 26 ADVANTAGE (KITS) ×1 IMPLANT
KIT MICROPUNCTURE NIT STIFF (SHEATH) ×1 IMPLANT
PROTECTION STATION PRESSURIZED (MISCELLANEOUS) ×2
RANGER DCB 5.0X80 135 (BALLOONS) ×2
SHEATH PINNACLE R/O II 5F 6CM (SHEATH) ×1 IMPLANT
SHEATH PROBE COVER 6X72 (BAG) ×3 IMPLANT
STATION PROTECTION PRESSURIZED (MISCELLANEOUS) ×1 IMPLANT
STOPCOCK MORSE 400PSI 3WAY (MISCELLANEOUS) ×2 IMPLANT
TRAY PV CATH (CUSTOM PROCEDURE TRAY) ×2 IMPLANT
TUBING CIL FLEX 10 FLL-RA (TUBING) ×2 IMPLANT
WIRE BENTSON .035X145CM (WIRE) ×1 IMPLANT
WIRE G V18X300CM (WIRE) ×1 IMPLANT
WIRE SPARTACORE .014X190CM (WIRE) ×2 IMPLANT

## 2022-01-29 NOTE — Progress Notes (Signed)
Patient and wife was given discharge instructions. Both verbalized understanding. 

## 2022-01-29 NOTE — Op Note (Signed)
? ? ?  Patient name: Ernest Barnett MRN: [id number] DOB: Apr 02, 1956 Sex: male ? ?01/29/2022 ?Pre-operative Diagnosis: Poorly functioning right radiocephalic fistula ?Post-operative diagnosis:  Same ?Surgeon:  Annamarie Major ?Procedure Performed: ? 1.  Ultrasound-guided access, right radiocephalic fistula x2 ? 2.  Fistulogram ? 3.  Venoplasty, cephalic vein (peripheral) ? 4.  Conscious sedation, 54 minutes ? ? ?Indications: The patient is having difficulty with cannulation of his fistula.  He is here for further evaluation. ? ?Procedure:  The patient was identified in the holding area and taken to room 8.  The patient was then placed supine on the table and prepped and draped in the usual sterile fashion.  A time out was called.  Conscious sedation was administered with the use of IV fentanyl and Versed under continuous physician and nurse monitoring.  Heart rate, blood pressure, and oxygen saturations were continuously monitored.  Total sedation time was 54 minutes ultrasound was used to evaluate the fistula.  The vein was patent and compressible.  A digital ultrasound image was acquired.  The fistula was then accessed under ultrasound guidance using a micropuncture needle.  An 018 wire was then asvanced without resistance and a micropuncture sheath was placed.  Contrast injections were then performed through the sheath. ? ?Findings: No central venous stenosis.  The cephalic vein is widely patent in the upper arm.  The forearm cephalic vein has tandem stenoses, 1 of which is greater than 90% near the arterial venous anastomosis.  Several branches remain.  The arterial venous anastomosis appears widely patent. ?  ?Intervention: I had to get a second access above the lesion in order to work towards the hand to treat the stenosis.  This was done under ultrasound guidance.  Ultimately a 5 French sheath was placed.  I then advanced a V-18 wire across the arterial venous anastomosis.  The patient was given 3000 units of  heparin.  I initially performed balloon venoplasty using a 5 x 80 drug-coated Ranger balloon for 2-1/2 minutes.  Completion imaging showed resolution of the stenosis but there is still an area of luminal narrowing and so I upsized to a 6 x 100 Sterling balloon and repeated balloon venoplasty.  This time I was satisfied with the result with no residual stenosis greater than 10%.  Cannulation sites were closed with a Monocryl.  There were no immediate complications ? ?Impression: ? #1  Tandem stenoses within the proximal portion of the right radiocephalic fistula successfully treated first with a 5 mm drug-coated balloon and then with a 6 mm balloon.  There is no residual stenosis ? #2  No central venous stenosis. ? ? ?V. Annamarie Major, M.D., FACS ?[facility] ?Office: [phone] ?Pager:  [phone]  ?

## 2022-01-29 NOTE — H&P (Signed)
? ?[facility] ? ?Patient name: Ernest Barnett MRN: [id number] DOB: May 10, 1956 Sex: male ? ? ? ? ?HISOTRY OF PRESENT ILLNESS:  ? ? ?Ernest Barnett is a 66 y.o. male who is status post right radiocephalic fistula by Dr. Donnetta Hutching on 02/09/2019.  He then underwent branch ligation on 09/11/2021.  He is still having difficulty with cannulation.  He has not been able to dialyze using more than 1 needle.  He still catheter in place. ? ?The patient suffers from lifelong hypertension.  He works as a Presenter, broadcasting.  He is left-handed.  He suffers from COPD secondary to chronic smoking.  He is on a statin ? ?PAST MEDICAL HISTORY:  ? ?Past Medical History:  ?Diagnosis Date  ? Chronic kidney disease   ? COPD (chronic obstructive pulmonary disease) ([place])   ? CVA (cerebral vascular accident) [facility]) 2013  ? Cystoid macular edema of left eye 03/07/2021  ? GERD (gastroesophageal reflux disease)   ? HOH (hard of hearing)   ? Hypercholesteremia   ? Hypertension   ? Left epiretinal membrane 03/07/2021  ? Pseudophakia 03/07/2021  ? Stroke ([place]) 03/24/2012  ? left sided weakness  ? Vitamin D deficiency   ? ? ? ?FAMILY HISTORY:  ? ?No family history on file. ? ?SOCIAL HISTORY:  ? ?Social History  ? ?Tobacco Use  ? Smoking status: Former  ?  Packs/day: 3.00  ?  Years: 30.00  ?  Pack years: 90.00  ?  Types: Cigarettes  ? Smokeless tobacco: Current  ? Tobacco comments:  ?  Currently vape  ?Substance Use Topics  ? Alcohol use: Yes  ?  Alcohol/week: 1.0 - 2.0 standard drink  ?  Types: 1 - 2 Cans of beer per week  ?  Comment: Occasionally  ? ? ? ?ALLERGIES:  ? ?No Known Allergies ? ? ?CURRENT MEDICATIONS:  ? ?Current Facility-Administered Medications  ?Medication Dose Route Frequency Provider Last Rate Last Admin  ? 0.9 %  sodium chloride infusion  250 mL Intravenous PRN Serafina Mitchell, MD      ? sodium chloride flush (NS) 0.9 % injection 3 mL  3 mL Intravenous Q12H Serafina Mitchell, MD      ?  sodium chloride flush (NS) 0.9 % injection 3 mL  3 mL Intravenous PRN Serafina Mitchell, MD      ? ?Facility-Administered Medications Ordered in Other Encounters  ?Medication Dose Route Frequency Provider Last Rate Last Admin  ? palonosetron (ALOXI) 0.25 MG/5ML injection           ? ? ?REVIEW OF SYSTEMS:  ? ?'[X]'$  denotes positive finding, '[ ]'$  denotes negative finding ?Cardiac  Comments:  ?Chest pain or chest pressure:    ?Shortness of breath upon exertion:    ?Short of breath when lying flat:    ?Irregular heart rhythm:    ?    ?Vascular    ?Pain in calf, thigh, or hip brought on by ambulation:    ?Pain in feet at night that wakes you up from your sleep:     ?Blood clot in your veins:    ?Leg swelling:     ?    ?Pulmonary    ?Oxygen at home:    ?Productive cough:     ?Wheezing:     ?    ?Neurologic    ?Sudden weakness in arms or legs:     ?Sudden numbness in arms or legs:     ?Sudden onset  of difficulty speaking or slurred speech:    ?Temporary loss of vision in one eye:     ?Problems with dizziness:     ?    ?Gastrointestinal    ?Blood in stool:     ?Vomited blood:     ?    ?Genitourinary    ?Burning when urinating:     ?Blood in urine:    ?    ?Psychiatric    ?Major depression:     ?    ?Hematologic    ?Bleeding problems:    ?Problems with blood clotting too easily:    ?    ?Skin    ?Rashes or ulcers:    ?    ?Constitutional    ?Fever or chills:    ? ? ?PHYSICAL EXAM:  ? ?Vitals:  ? 01/29/22 0621  ?BP: (!) 115/52  ?Pulse: 92  ?Resp: 20  ?Temp: 98.1 ?F (36.7 ?C)  ?TempSrc: Oral  ?SpO2: 95%  ?Weight: 117.9 kg  ?Height: '5\' 7"'$  (1.702 m)  ? ? ?GENERAL: The patient is a well-nourished male, in no acute distress. The vital signs are documented above. ?CARDIAC: There is a regular rate and rhythm.  ?VASCULAR: Palpable thrill within right radiocephalic fistula ?PULMONARY: Non-labored respirations ?MUSCULOSKELETAL: There are no major deformities or cyanosis. ?NEUROLOGIC: No focal weakness or paresthesias are detected. ?SKIN:  There are no ulcers or rashes noted. ?PSYCHIATRIC: The patient has a normal affect. ? ?STUDIES:  ? ?none ? ?MEDICAL ISSUES:  ? ?End-stage renal disease with poorly functioning right radiocephalic fistula: I discussed with the patient that we would proceed with fistulogram to better evaluate his fistula and intervene if indicated.  He understands that this fistula may not be appropriate and he may need to have this done in the upper arm.  All questions answered. ? ? ? ?Annamarie Major, IV, MD, FACS ?[facility] ?Tel [phone] ?Pager [phone]  ?

## 2022-01-31 ENCOUNTER — Inpatient Hospital Stay (HOSPITAL_COMMUNITY): Payer: Medicare HMO

## 2022-01-31 ENCOUNTER — Encounter (HOSPITAL_COMMUNITY): Payer: Self-pay

## 2022-01-31 VITALS — BP 101/69 | HR 95 | Temp 98.8°F | Resp 20 | Ht 66.0 in | Wt 258.0 lb

## 2022-01-31 DIAGNOSIS — Z5112 Encounter for antineoplastic immunotherapy: Secondary | ICD-10-CM | POA: Diagnosis not present

## 2022-01-31 DIAGNOSIS — C9000 Multiple myeloma not having achieved remission: Secondary | ICD-10-CM | POA: Diagnosis not present

## 2022-01-31 DIAGNOSIS — D472 Monoclonal gammopathy: Secondary | ICD-10-CM

## 2022-01-31 LAB — CBC WITH DIFFERENTIAL/PLATELET
Abs Immature Granulocytes: 0.02 10*3/uL (ref 0.00–0.07)
Basophils Absolute: 0 10*3/uL (ref 0.0–0.1)
Basophils Relative: 0 %
Eosinophils Absolute: 0.1 10*3/uL (ref 0.0–0.5)
Eosinophils Relative: 2 %
HCT: 33.4 % — ABNORMAL LOW (ref 39.0–52.0)
Hemoglobin: 10.9 g/dL — ABNORMAL LOW (ref 13.0–17.0)
Immature Granulocytes: 0 %
Lymphocytes Relative: 33 %
Lymphs Abs: 2 10*3/uL (ref 0.7–4.0)
MCH: 31.4 pg (ref 26.0–34.0)
MCHC: 32.6 g/dL (ref 30.0–36.0)
MCV: 96.3 fL (ref 80.0–100.0)
Monocytes Absolute: 0.6 10*3/uL (ref 0.1–1.0)
Monocytes Relative: 10 %
Neutro Abs: 3.3 10*3/uL (ref 1.7–7.7)
Neutrophils Relative %: 55 %
Platelets: 165 10*3/uL (ref 150–400)
RBC: 3.47 MIL/uL — ABNORMAL LOW (ref 4.22–5.81)
RDW: 14 % (ref 11.5–15.5)
WBC: 5.9 10*3/uL (ref 4.0–10.5)
nRBC: 0 % (ref 0.0–0.2)

## 2022-01-31 LAB — COMPREHENSIVE METABOLIC PANEL
ALT: 17 U/L (ref 0–44)
AST: 12 U/L — ABNORMAL LOW (ref 15–41)
Albumin: 3.7 g/dL (ref 3.5–5.0)
Alkaline Phosphatase: 24 U/L — ABNORMAL LOW (ref 38–126)
Anion gap: 10 (ref 5–15)
BUN: 43 mg/dL — ABNORMAL HIGH (ref 8–23)
CO2: 20 mmol/L — ABNORMAL LOW (ref 22–32)
Calcium: 8.5 mg/dL — ABNORMAL LOW (ref 8.9–10.3)
Chloride: 105 mmol/L (ref 98–111)
Creatinine, Ser: 4.94 mg/dL — ABNORMAL HIGH (ref 0.61–1.24)
GFR, Estimated: 12 mL/min — ABNORMAL LOW (ref >60)
Glucose, Bld: 105 mg/dL — ABNORMAL HIGH (ref 70–99)
Potassium: 3.8 mmol/L (ref 3.5–5.1)
Sodium: 135 mmol/L (ref 135–145)
Total Bilirubin: 0.5 mg/dL (ref 0.3–1.2)
Total Protein: 6.7 g/dL (ref 6.5–8.1)

## 2022-01-31 LAB — MAGNESIUM: Magnesium: 2.2 mg/dL (ref 1.7–2.4)

## 2022-01-31 LAB — LACTATE DEHYDROGENASE: LDH: 190 U/L (ref 98–192)

## 2022-01-31 MED ORDER — BORTEZOMIB CHEMO SQ INJECTION 3.5 MG (2.5MG/ML)
1.0000 mg/m2 | Freq: Once | INTRAMUSCULAR | Status: AC
  Administered 2022-01-31: 2.25 mg via SUBCUTANEOUS
  Filled 2022-01-31: qty 0.9

## 2022-01-31 NOTE — Patient Instructions (Signed)
[facility]  Discharge Instructions: ?Thank you for choosing [facility] to provide your oncology and hematology care.  ?If you have a lab appointment with the [facility], please come in thru the Main Entrance and check in at the main information desk. ? ?Wear comfortable clothing and clothing appropriate for easy access to any Portacath or PICC line.  ? ?We strive to give you quality time with your provider. You may need to reschedule your appointment if you arrive late (15 or more minutes).  Arriving late affects you and other patients whose appointments are after yours.  Also, if you miss three or more appointments without notifying the office, you may be dismissed from the clinic at the provider?s discretion.    ?  ?For prescription refill requests, have your pharmacy contact our office and allow 72 hours for refills to be completed.   ? ?Today you received the following chemotherapy and/or immunotherapy agents velcade, return as scheduled. ?  ?To help prevent nausea and vomiting after your treatment, we encourage you to take your nausea medication as directed. ? ?BELOW ARE SYMPTOMS THAT SHOULD BE REPORTED IMMEDIATELY: ?*FEVER GREATER THAN 100.4 F (38 ?C) OR HIGHER ?*CHILLS OR SWEATING ?*NAUSEA AND VOMITING THAT IS NOT CONTROLLED WITH YOUR NAUSEA MEDICATION ?*UNUSUAL SHORTNESS OF BREATH ?*UNUSUAL BRUISING OR BLEEDING ?*URINARY PROBLEMS (pain or burning when urinating, or frequent urination) ?*BOWEL PROBLEMS (unusual diarrhea, constipation, pain near the anus) ?TENDERNESS IN MOUTH AND THROAT WITH OR WITHOUT PRESENCE OF ULCERS (sore throat, sores in mouth, or a toothache) ?UNUSUAL RASH, SWELLING OR PAIN  ?UNUSUAL VAGINAL DISCHARGE OR ITCHING  ? ?Items with * indicate a potential emergency and should be followed up as soon as possible or go to the Emergency Department if any problems should occur. ? ?Please show the CHEMOTHERAPY ALERT CARD or IMMUNOTHERAPY ALERT CARD at check-in to the  Emergency Department and triage nurse. ? ?Should you have questions after your visit or need to cancel or reschedule your appointment, please contact [facility] [phone]  and follow the prompts.  Office hours are 8:00 a.m. to 4:30 p.m. Monday - Friday. Please note that voicemails left after 4:00 p.m. may not be returned until the following business day.  We are closed weekends and major holidays. You have access to a nurse at all times for urgent questions. Please call the main number to the clinic [phone] and follow the prompts. ? ?For any non-urgent questions, you may also contact your provider using MyChart. We now offer e-Visits for anyone 65 and older to request care online for non-urgent symptoms. For details visit [email or web address]. ?  ?Also download the MyChart app! Go to the app store, search "MyChart", open the app, select [facility], and log in with your MyChart username and password. ? ?Due to Covid, a mask is required upon entering the hospital/clinic. If you do not have a mask, one will be given to you upon arrival. For doctor visits, patients may have 1 support person aged 65 or older with them. For treatment visits, patients cannot have anyone with them due to current Covid guidelines and our immunocompromised population.  ?

## 2022-01-31 NOTE — Progress Notes (Signed)
Patient tolerated Velcade injection with no complaints voiced. Lab work reviewed. See MAR for details. Injection site clean and dry with no bruising or swelling noted. Patient stable during and after injection. Band aid applied. VSS. Patient left in satisfactory condition with no s/s of distress noted. 

## 2022-02-01 DIAGNOSIS — Z992 Dependence on renal dialysis: Secondary | ICD-10-CM | POA: Diagnosis not present

## 2022-02-01 DIAGNOSIS — N186 End stage renal disease: Secondary | ICD-10-CM | POA: Diagnosis not present

## 2022-02-01 LAB — KAPPA/LAMBDA LIGHT CHAINS
Kappa free light chain: 48.5 mg/L — ABNORMAL HIGH (ref 3.3–19.4)
Kappa, lambda light chain ratio: 4.53 — ABNORMAL HIGH (ref 0.26–1.65)
Lambda free light chains: 10.7 mg/L (ref 5.7–26.3)

## 2022-02-04 LAB — PROTEIN ELECTROPHORESIS, SERUM
A/G Ratio: 1.4 (ref 0.7–1.7)
Albumin ELP: 3.7 g/dL (ref 2.9–4.4)
Alpha-1-Globulin: 0.2 g/dL (ref 0.0–0.4)
Alpha-2-Globulin: 0.9 g/dL (ref 0.4–1.0)
Beta Globulin: 0.9 g/dL (ref 0.7–1.3)
Gamma Globulin: 0.6 g/dL (ref 0.4–1.8)
Globulin, Total: 2.6 g/dL (ref 2.2–3.9)
M-Spike, %: 0.2 g/dL — ABNORMAL HIGH
Total Protein ELP: 6.3 g/dL (ref 6.0–8.5)

## 2022-02-06 DIAGNOSIS — N186 End stage renal disease: Secondary | ICD-10-CM | POA: Diagnosis not present

## 2022-02-06 DIAGNOSIS — Z992 Dependence on renal dialysis: Secondary | ICD-10-CM | POA: Diagnosis not present

## 2022-02-08 DIAGNOSIS — N186 End stage renal disease: Secondary | ICD-10-CM | POA: Diagnosis not present

## 2022-02-08 DIAGNOSIS — Z992 Dependence on renal dialysis: Secondary | ICD-10-CM | POA: Diagnosis not present

## 2022-02-13 DIAGNOSIS — N186 End stage renal disease: Secondary | ICD-10-CM | POA: Diagnosis not present

## 2022-02-13 DIAGNOSIS — Z992 Dependence on renal dialysis: Secondary | ICD-10-CM | POA: Diagnosis not present

## 2022-02-14 ENCOUNTER — Inpatient Hospital Stay (HOSPITAL_COMMUNITY): Payer: Medicare HMO | Attending: Hematology

## 2022-02-14 ENCOUNTER — Inpatient Hospital Stay (HOSPITAL_BASED_OUTPATIENT_CLINIC_OR_DEPARTMENT_OTHER): Payer: Medicare HMO | Admitting: Hematology

## 2022-02-14 ENCOUNTER — Inpatient Hospital Stay (HOSPITAL_COMMUNITY): Payer: Medicare HMO

## 2022-02-14 VITALS — BP 130/74 | HR 94 | Temp 97.5°F | Resp 19 | Wt 256.9 lb

## 2022-02-14 DIAGNOSIS — Z5112 Encounter for antineoplastic immunotherapy: Secondary | ICD-10-CM | POA: Insufficient documentation

## 2022-02-14 DIAGNOSIS — C9000 Multiple myeloma not having achieved remission: Secondary | ICD-10-CM

## 2022-02-14 DIAGNOSIS — D472 Monoclonal gammopathy: Secondary | ICD-10-CM

## 2022-02-14 LAB — COMPREHENSIVE METABOLIC PANEL
ALT: 23 U/L (ref 0–44)
AST: 16 U/L (ref 15–41)
Albumin: 3.9 g/dL (ref 3.5–5.0)
Alkaline Phosphatase: 26 U/L — ABNORMAL LOW (ref 38–126)
Anion gap: 9 (ref 5–15)
BUN: 36 mg/dL — ABNORMAL HIGH (ref 8–23)
CO2: 25 mmol/L (ref 22–32)
Calcium: 9.5 mg/dL (ref 8.9–10.3)
Chloride: 104 mmol/L (ref 98–111)
Creatinine, Ser: 4.08 mg/dL — ABNORMAL HIGH (ref 0.61–1.24)
GFR, Estimated: 15 mL/min — ABNORMAL LOW (ref >60)
Glucose, Bld: 118 mg/dL — ABNORMAL HIGH (ref 70–99)
Potassium: 3.7 mmol/L (ref 3.5–5.1)
Sodium: 138 mmol/L (ref 135–145)
Total Bilirubin: 0.6 mg/dL (ref 0.3–1.2)
Total Protein: 7 g/dL (ref 6.5–8.1)

## 2022-02-14 LAB — CBC WITH DIFFERENTIAL/PLATELET
Abs Immature Granulocytes: 0.02 10*3/uL (ref 0.00–0.07)
Basophils Absolute: 0.1 10*3/uL (ref 0.0–0.1)
Basophils Relative: 1 %
Eosinophils Absolute: 0.1 10*3/uL (ref 0.0–0.5)
Eosinophils Relative: 1 %
HCT: 34.6 % — ABNORMAL LOW (ref 39.0–52.0)
Hemoglobin: 11.5 g/dL — ABNORMAL LOW (ref 13.0–17.0)
Immature Granulocytes: 0 %
Lymphocytes Relative: 41 %
Lymphs Abs: 2.7 10*3/uL (ref 0.7–4.0)
MCH: 31.2 pg (ref 26.0–34.0)
MCHC: 33.2 g/dL (ref 30.0–36.0)
MCV: 93.8 fL (ref 80.0–100.0)
Monocytes Absolute: 0.6 10*3/uL (ref 0.1–1.0)
Monocytes Relative: 9 %
Neutro Abs: 3.1 10*3/uL (ref 1.7–7.7)
Neutrophils Relative %: 48 %
Platelets: 301 10*3/uL (ref 150–400)
RBC: 3.69 MIL/uL — ABNORMAL LOW (ref 4.22–5.81)
RDW: 14 % (ref 11.5–15.5)
WBC: 6.5 10*3/uL (ref 4.0–10.5)
nRBC: 0 % (ref 0.0–0.2)

## 2022-02-14 LAB — MAGNESIUM: Magnesium: 2.2 mg/dL (ref 1.7–2.4)

## 2022-02-14 MED ORDER — BORTEZOMIB CHEMO SQ INJECTION 3.5 MG (2.5MG/ML)
1.0000 mg/m2 | Freq: Once | INTRAMUSCULAR | Status: AC
  Administered 2022-02-14: 2.25 mg via SUBCUTANEOUS
  Filled 2022-02-14: qty 0.9

## 2022-02-14 MED ORDER — DARATUMUMAB-HYALURONIDASE-FIHJ 1800-30000 MG-UT/15ML ~~LOC~~ SOLN
1800.0000 mg | Freq: Once | SUBCUTANEOUS | Status: AC
  Administered 2022-02-14: 1800 mg via SUBCUTANEOUS
  Filled 2022-02-14: qty 15

## 2022-02-14 NOTE — Patient Instructions (Signed)
[facility]  Discharge Instructions: ?Thank you for choosing [facility] to provide your oncology and hematology care.  ?If you have a lab appointment with the [facility], please come in thru the Main Entrance and check in at the main information desk. ? ?Wear comfortable clothing and clothing appropriate for easy access to any Portacath or PICC line.  ? ?We strive to give you quality time with your provider. You may need to reschedule your appointment if you arrive late (15 or more minutes).  Arriving late affects you and other patients whose appointments are after yours.  Also, if you miss three or more appointments without notifying the office, you may be dismissed from the clinic at the provider?s discretion.    ?  ?For prescription refill requests, have your pharmacy contact our office and allow 72 hours for refills to be completed.   ? ?Today you received the following chemotherapy and/or immunotherapy agents Dara SQ and Velcade injection. ?  ?To help prevent nausea and vomiting after your treatment, we encourage you to take your nausea medication as directed. ? ?BELOW ARE SYMPTOMS THAT SHOULD BE REPORTED IMMEDIATELY: ?*FEVER GREATER THAN 100.4 F (38 ?C) OR HIGHER ?*CHILLS OR SWEATING ?*NAUSEA AND VOMITING THAT IS NOT CONTROLLED WITH YOUR NAUSEA MEDICATION ?*UNUSUAL SHORTNESS OF BREATH ?*UNUSUAL BRUISING OR BLEEDING ?*URINARY PROBLEMS (pain or burning when urinating, or frequent urination) ?*BOWEL PROBLEMS (unusual diarrhea, constipation, pain near the anus) ?TENDERNESS IN MOUTH AND THROAT WITH OR WITHOUT PRESENCE OF ULCERS (sore throat, sores in mouth, or a toothache) ?UNUSUAL RASH, SWELLING OR PAIN  ?UNUSUAL VAGINAL DISCHARGE OR ITCHING  ? ?Items with * indicate a potential emergency and should be followed up as soon as possible or go to the Emergency Department if any problems should occur. ? ?Please show the CHEMOTHERAPY ALERT CARD or IMMUNOTHERAPY ALERT CARD at check-in to  the Emergency Department and triage nurse. ? ?Should you have questions after your visit or need to cancel or reschedule your appointment, please contact [facility] [phone]  and follow the prompts.  Office hours are 8:00 a.m. to 4:30 p.m. Monday - Friday. Please note that voicemails left after 4:00 p.m. may not be returned until the following business day.  We are closed weekends and major holidays. You have access to a nurse at all times for urgent questions. Please call the main number to the clinic [phone] and follow the prompts. ? ?For any non-urgent questions, you may also contact your provider using MyChart. We now offer e-Visits for anyone 67 and older to request care online for non-urgent symptoms. For details visit [email or web address]. ?  ?Also download the MyChart app! Go to the app store, search "MyChart", open the app, select [facility], and log in with your MyChart username and password. ? ?Due to Covid, a mask is required upon entering the hospital/clinic. If you do not have a mask, one will be given to you upon arrival. For doctor visits, patients may have 1 support person aged 76 or older with them. For treatment visits, patients cannot have anyone with them due to current Covid guidelines and our immunocompromised population.  ?

## 2022-02-14 NOTE — Progress Notes (Signed)
Pt presents today for Dara SQ and Velcade injections per provider's order Vital signs and labs WNL for treatment today. Pt took pre-meds at home prior to arrival. Okay to proceed with treatment today per Dr.K. ? ?Dara SQ and Velcade injections given today per MD orders. Tolerated infusion without adverse affects. Vital signs stable. No complaints at this time. Discharged from clinic ambulatory in stable condition. Alert and oriented x 3. F/U with [facility] as scheduled.   ? ?

## 2022-02-14 NOTE — Progress Notes (Signed)
Patient has been examined by Dr. Katragadda, and vital signs and labs have been reviewed. ANC, Creatinine, LFTs, hemoglobin, and platelets are within treatment parameters per M.D. - pt may proceed with treatment.    °

## 2022-02-14 NOTE — Patient Instructions (Signed)
[facility] at [facility] ?Discharge Instructions ? ? ?You were seen and examined today by Dr. Delton Coombes. ? ?He reviewed your lab work.  Your m-spike is down to 0.2, which is good.  All other results are normal/stable.  ? ?We will proceed with your treatment today. ? ?Return as scheduled.  ? ? ?Thank you for choosing [facility] at [facility] to provide your oncology and hematology care.  To afford each patient quality time with our provider, please arrive at least 15 minutes before your scheduled appointment time.  ? ?If you have a lab appointment with the [facility] please come in thru the Main Entrance and check in at the main information desk. ? ?You need to re-schedule your appointment should you arrive 10 or more minutes late.  We strive to give you quality time with our providers, and arriving late affects you and other patients whose appointments are after yours.  Also, if you no show three or more times for appointments you may be dismissed from the clinic at the providers discretion.     ?Again, thank you for choosing [facility].  Our hope is that these requests will decrease the amount of time that you wait before being seen by our physicians.       ?_____________________________________________________________ ? ?Should you have questions after your visit to [facility], please contact our office at [phone] and follow the prompts.  Our office hours are 8:00 a.m. and 4:30 p.m. Monday - Friday.  Please note that voicemails left after 4:00 p.m. may not be returned until the following business day.  We are closed weekends and major holidays.  You do have access to a nurse 24-7, just call the main number to the clinic [phone] and do not press any options, hold on the line and a nurse will answer the phone.   ? ?For prescription refill requests, have your pharmacy contact our office and allow 72 hours.   ? ?Due to Covid,  you will need to wear a mask upon entering the hospital. If you do not have a mask, a mask will be given to you at the Main Entrance upon arrival. For doctor visits, patients may have 1 support person age 47 or older with them. For treatment visits, patients can not have anyone with them due to social distancing guidelines and our immunocompromised population.  ? ?   ?

## 2022-02-14 NOTE — Progress Notes (Signed)
? ?[facility] ?[address] ?[place], [place] [address] ? ? ?CLINIC:  ?Medical Oncology/Hematology ? ?PCP:  ?Lindell Spar, MD ?[address] / [place] [address] ?[phone] ? ? ?REASON FOR VISIT:  ?Follow-up for multiple myeloma ? ?PRIOR THERAPY: CyBorD ? ?NGS Results: not done ? ?CURRENT THERAPY: Velcade, daratumumab and dexamethasone ? ?BRIEF ONCOLOGIC HISTORY:  ?Oncology History  ?Multiple myeloma ([place])  ?03/06/2021 Initial Diagnosis  ? Multiple myeloma ([facility]) ?  ?03/06/2021 Cancer Staging  ? Staging form: Plasma Cell Myeloma and Plasma Cell Disorders, AJCC 8th Edition ?- Clinical stage from 03/06/2021: RISS Stage II (Beta-2-microglobulin (mg/L): 5.5, Albumin (g/dL): 3.5, ISS: Stage III, High-risk cytogenetics: Absent, LDH: Normal) - Signed by Derek Jack, MD on 03/06/2021 ?Stage prefix: Initial diagnosis ?Beta 2 microglobulin range (mg/L): Greater than or equal to 5.5 ?Albumin range (g/dL): Greater than or equal to 3.5 ?Cytogenetics: No abnormalities ?Pretreatment monoclonal protein level in serum (M spike) (g/dL): 2 ?Pretreatment serum free kappa light chain level (g/L): 794 ?Pretreatment serum free lambda light chain level (g/L): 15.9 ? ?  ?03/14/2021 - 07/04/2021 Chemotherapy  ? Patient is on Treatment Plan : MULTIPLE MYELOMA CyBorD - Weekly Bortezomib  ?   ?08/30/2021 -  Chemotherapy  ? Patient is on Treatment Plan : MYELOMA RELAPSED / REFRACTORY Daratumumab SQ + Bortezomib + Dexamethasone (DaraVd) q21d / Daratumumab SQ q28d   ?   ? ? ?CANCER STAGING: ? Cancer Staging  ?Multiple myeloma ([place]) ?Staging form: Plasma Cell Myeloma and Plasma Cell Disorders, AJCC 8th Edition ?- Clinical stage from 03/06/2021: RISS Stage II (Beta-2-microglobulin (mg/L): 5.5, Albumin (g/dL): 3.5, ISS: Stage III, High-risk cytogenetics: Absent, LDH: Normal) - Signed by Derek Jack, MD on 03/06/2021 ? ? ?INTERVAL HISTORY:  ?[name], a 66 y.o. male, returns for routine follow-up and consideration  for next cycle of chemotherapy. Ernest Barnett was last seen on 01/03/2022. ? ?Due for cycle #9 of Velcade and Darzalex Faspro today.  ? ?Overall, he tells me he has been feeling pretty well. He reports numbness in his left big toe which is stable.  ? ?Overall, he feels ready for next cycle of chemo today.  ? ?REVIEW OF SYSTEMS:  ?Review of Systems  ?Constitutional:  Negative for appetite change and fatigue.  ?Respiratory:  Positive for shortness of breath.   ?Neurological:  Positive for numbness.  ?All other systems reviewed and are negative. ? ?PAST MEDICAL/SURGICAL HISTORY:  ?Past Medical History:  ?Diagnosis Date  ? Chronic kidney disease   ? COPD (chronic obstructive pulmonary disease) ([place])   ? CVA (cerebral vascular accident) [facility]) 2013  ? Cystoid macular edema of left eye 03/07/2021  ? GERD (gastroesophageal reflux disease)   ? HOH (hard of hearing)   ? Hypercholesteremia   ? Hypertension   ? Left epiretinal membrane 03/07/2021  ? Pseudophakia 03/07/2021  ? Stroke ([place]) 03/24/2012  ? left sided weakness  ? Vitamin D deficiency   ? ?Past Surgical History:  ?Procedure Laterality Date  ? A/V FISTULAGRAM Right 01/29/2022  ? Procedure: A/V Fistulagram;  Surgeon: Serafina Mitchell, MD;  Location: [facility];  Service: Cardiovascular;  Laterality: Right;  ? AV FISTULA PLACEMENT Right 02/08/2021  ? Procedure: RIGHT ARM ARTERIOVENOUS (AV) FISTULA CREATION;  Surgeon: [name], MD;  Location: [facility];  Service: Vascular;  Laterality: Right;  ? BIOPSY  11/12/2018  ? Procedure: BIOPSY;  Surgeon: Rogene Houston, MD;  [facility]: [facility];  Service: Endoscopy;;  colon  ?  BIOPSY  05/10/2020  ? Procedure: BIOPSY;  Surgeon: Rogene Houston, MD;  [facility];  Service: Endoscopy;;  esophagus  ? CATARACT EXTRACTION    ? right eye  ? CATARACT EXTRACTION W/PHACO  10/08/2012  ? Procedure: CATARACT EXTRACTION PHACO AND INTRAOCULAR LENS PLACEMENT (IOC);  Surgeon: Tonny Branch, MD;  Location: [facility];  Service: Ophthalmology;   Laterality: Left;  CDE:6.64  ? CHOLECYSTECTOMY    ? [place]  ? COLONOSCOPY N/A 11/12/2018  ? Procedure: COLONOSCOPY;  Surgeon: Rogene Houston, MD;  [facility];  Service: Endoscopy;  Laterality: N/A;  1030  ? ELBOW FRACTURE SURGERY    ? left  ? ESOPHAGEAL DILATION N/A 11/12/2018  ? Procedure: ESOPHAGEAL DILATION;  Surgeon: Rogene Houston, MD;  [facility];  Service: Endoscopy;  Laterality: N/A;  ? ESOPHAGEAL DILATION N/A 05/10/2020  ? Procedure: ESOPHAGEAL DILATION;  Surgeon: Rogene Houston, MD;  [facility];  Service: Endoscopy;  Laterality: N/A;  ? ESOPHAGOGASTRODUODENOSCOPY N/A 11/12/2018  ? Procedure: ESOPHAGOGASTRODUODENOSCOPY (EGD);  Surgeon: Rogene Houston, MD;  [facility];  Service: Endoscopy;  Laterality: N/A;  ? ESOPHAGOGASTRODUODENOSCOPY N/A 05/10/2020  ? Procedure: ESOPHAGOGASTRODUODENOSCOPY (EGD);  Surgeon: Rogene Houston, MD;  [facility];  Service: Endoscopy;  Laterality: N/A;  210  ? HERNIA REPAIR    ? right inguinal  ? HYDROCELE EXCISION / REPAIR    ? IR FLUORO GUIDE CV LINE LEFT  08/17/2021  ? IR US GUIDE VASC ACCESS LEFT  08/17/2021  ? LIGATION OF COMPETING BRANCHES OF ARTERIOVENOUS FISTULA Right 09/11/2021  ? Procedure: LIGATION OF COMPETING BRANCHES OF RIGHT ARM ARTERIOVENOUS FISTULA;  Surgeon: [name], MD;  Location: [facility];  Service: Vascular;  Laterality: Right;  ? PERIPHERAL VASCULAR BALLOON ANGIOPLASTY Right 01/29/2022  ? Procedure: PERIPHERAL VASCULAR BALLOON ANGIOPLASTY;  Surgeon: Serafina Mitchell, MD;  Location: [facility];  Service: Cardiovascular;  Laterality: Right;  arm fistula  ? POLYPECTOMY  11/12/2018  ? Procedure: POLYPECTOMY;  Surgeon: Rogene Houston, MD;  [facility];  Service: Endoscopy;;  colon ?  ? RETINAL DETACHMENT SURGERY Left 2019  ? SPLENECTOMY, TOTAL    ? TONSILLECTOMY    ? VOCAL CORD INJECTION    ? removal of polyp-2005  ? ? ?SOCIAL HISTORY:  ?Social History  ? ?Socioeconomic History  ?  Marital status: Significant Other  ?  Spouse name: Not on file  ? Number of children: 2  ? Years of education: Not on file  ? Highest education level: Not on file  ?Occupational History  ? Occupation: Disability since having a stroke  ?  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 Smoking status: Former  ?  Packs/day: 3.00  ?  Years: 30.00  ?  Pack years: 90.00  ?  Types: Cigarettes  ? Smokeless tobacco: Current  ? Tobacco comments:  ?  Currently vape  ?Vaping Use  ? Vaping Use: Every day  ? Substances: Nicotine  ?Substance and Sexual Activity  ? Alcohol use: Yes  ?  Alcohol/week: 1.0 - 2.0 standard drink  ?  Types: 1 - 2 Cans of beer per week  ?  Comment: Occasionally  ? Drug use: No  ? Sexual activity: Not Currently  ?  Birth control/protection: Abstinence, None  ?Other Topics Concern  ? Not on file  ?Social History Narrative  ? 2 children, both nearby  ? ?Social Determinants of Health  ? ?  Financial Resource Strain: Low Risk   ? Difficulty of Paying Living Expenses: Not hard at all  ?Food Insecurity: No Food Insecurity  ? Worried About Charity fundraiser in the Last Year: Never true  ? Ran Out of Food in the Last Year: Never true  ?Transportation Needs: No Transportation Needs  ? Lack of Transportation (Medical): No  ? Lack of Transportation (Non-Medical): No  ?Physical Activity: Insufficiently Active  ? Days of Exercise per Week: 3 days  ? Minutes of Exercise per Session: 30 min  ?Stress: No Stress Concern Present  ? Feeling of Stress : Only a little  ?Social Connections: Socially Isolated  ? Frequency of Communication with Friends and Family: More than three times a week  ? Frequency of Social Gatherings with Friends and Family: More than three times a week  ? Attends Religious Services: Never  ? Active Member of Clubs or Organizations: No  ? Attends Archivist Meetings: Never  ? Marital Status: Divorced  ?Intimate Partner Violence: Not At Risk  ? Fear of Current or Ex-Partner: No  ? Emotionally  Abused: No  ? Physically Abused: No  ? Sexually Abused: No  ? ? ?FAMILY HISTORY:  ?No family history on file. ? ?CURRENT MEDICATIONS:  ?Current Outpatient Medications  ?Medication Sig Dispense Refill  ? albuter

## 2022-02-15 DIAGNOSIS — N186 End stage renal disease: Secondary | ICD-10-CM | POA: Diagnosis not present

## 2022-02-15 DIAGNOSIS — Z992 Dependence on renal dialysis: Secondary | ICD-10-CM | POA: Diagnosis not present

## 2022-02-18 DIAGNOSIS — Z992 Dependence on renal dialysis: Secondary | ICD-10-CM | POA: Diagnosis not present

## 2022-02-18 DIAGNOSIS — N186 End stage renal disease: Secondary | ICD-10-CM | POA: Diagnosis not present

## 2022-02-19 ENCOUNTER — Institutional Professional Consult (permissible substitution): Payer: Medicare HMO | Admitting: Internal Medicine

## 2022-02-20 DIAGNOSIS — N186 End stage renal disease: Secondary | ICD-10-CM | POA: Diagnosis not present

## 2022-02-20 DIAGNOSIS — Z992 Dependence on renal dialysis: Secondary | ICD-10-CM | POA: Diagnosis not present

## 2022-02-21 ENCOUNTER — Inpatient Hospital Stay (HOSPITAL_COMMUNITY): Payer: Medicare HMO

## 2022-02-21 VITALS — BP 108/79 | HR 104 | Temp 98.2°F | Resp 20 | Ht 66.0 in | Wt 258.6 lb

## 2022-02-21 DIAGNOSIS — C9000 Multiple myeloma not having achieved remission: Secondary | ICD-10-CM | POA: Diagnosis not present

## 2022-02-21 DIAGNOSIS — D472 Monoclonal gammopathy: Secondary | ICD-10-CM

## 2022-02-21 DIAGNOSIS — Z5112 Encounter for antineoplastic immunotherapy: Secondary | ICD-10-CM | POA: Diagnosis not present

## 2022-02-21 LAB — CBC WITH DIFFERENTIAL/PLATELET
Abs Immature Granulocytes: 0.03 10*3/uL (ref 0.00–0.07)
Basophils Absolute: 0 10*3/uL (ref 0.0–0.1)
Basophils Relative: 0 %
Eosinophils Absolute: 0.1 10*3/uL (ref 0.0–0.5)
Eosinophils Relative: 1 %
HCT: 34.1 % — ABNORMAL LOW (ref 39.0–52.0)
Hemoglobin: 11.3 g/dL — ABNORMAL LOW (ref 13.0–17.0)
Immature Granulocytes: 0 %
Lymphocytes Relative: 34 %
Lymphs Abs: 2.5 10*3/uL (ref 0.7–4.0)
MCH: 31.7 pg (ref 26.0–34.0)
MCHC: 33.1 g/dL (ref 30.0–36.0)
MCV: 95.8 fL (ref 80.0–100.0)
Monocytes Absolute: 0.6 10*3/uL (ref 0.1–1.0)
Monocytes Relative: 8 %
Neutro Abs: 4.2 10*3/uL (ref 1.7–7.7)
Neutrophils Relative %: 57 %
Platelets: 171 10*3/uL (ref 150–400)
RBC: 3.56 MIL/uL — ABNORMAL LOW (ref 4.22–5.81)
RDW: 14.7 % (ref 11.5–15.5)
WBC: 7.4 10*3/uL (ref 4.0–10.5)
nRBC: 0 % (ref 0.0–0.2)

## 2022-02-21 LAB — COMPREHENSIVE METABOLIC PANEL
ALT: 20 U/L (ref 0–44)
AST: 16 U/L (ref 15–41)
Albumin: 3.9 g/dL (ref 3.5–5.0)
Alkaline Phosphatase: 28 U/L — ABNORMAL LOW (ref 38–126)
Anion gap: 10 (ref 5–15)
BUN: 32 mg/dL — ABNORMAL HIGH (ref 8–23)
CO2: 25 mmol/L (ref 22–32)
Calcium: 9.1 mg/dL (ref 8.9–10.3)
Chloride: 103 mmol/L (ref 98–111)
Creatinine, Ser: 4.35 mg/dL — ABNORMAL HIGH (ref 0.61–1.24)
GFR, Estimated: 14 mL/min — ABNORMAL LOW (ref >60)
Glucose, Bld: 139 mg/dL — ABNORMAL HIGH (ref 70–99)
Potassium: 3.5 mmol/L (ref 3.5–5.1)
Sodium: 138 mmol/L (ref 135–145)
Total Bilirubin: 0.5 mg/dL (ref 0.3–1.2)
Total Protein: 7.1 g/dL (ref 6.5–8.1)

## 2022-02-21 LAB — MAGNESIUM: Magnesium: 2.1 mg/dL (ref 1.7–2.4)

## 2022-02-21 MED ORDER — BORTEZOMIB CHEMO SQ INJECTION 3.5 MG (2.5MG/ML)
1.0000 mg/m2 | Freq: Once | INTRAMUSCULAR | Status: AC
  Administered 2022-02-21: 2.25 mg via SUBCUTANEOUS
  Filled 2022-02-21: qty 0.9

## 2022-02-21 MED ORDER — DEXAMETHASONE 4 MG PO TABS: 20.0000 mg | ORAL_TABLET | Freq: Once | ORAL | Status: DC

## 2022-02-21 NOTE — Patient Instructions (Signed)
[facility]  Discharge Instructions: Thank you for choosing [facility] to provide your oncology and hematology care.  If you have a lab appointment with the Cancer Center, please come in thru the Main Entrance and check in at the main information desk.  Wear comfortable clothing and clothing appropriate for easy access to any Portacath or PICC line.   We strive to give you quality time with your provider. You may need to reschedule your appointment if you arrive late (15 or more minutes).  Arriving late affects you and other patients whose appointments are after yours.  Also, if you miss three or more appointments without notifying the office, you may be dismissed from the clinic at the provider's discretion.      For prescription refill requests, have your pharmacy contact our office and allow 72 hours for refills to be completed.        To help prevent nausea and vomiting after your treatment, we encourage you to take your nausea medication as directed.  BELOW ARE SYMPTOMS THAT SHOULD BE REPORTED IMMEDIATELY: *FEVER GREATER THAN 100.4 F (38 C) OR HIGHER *CHILLS OR SWEATING *NAUSEA AND VOMITING THAT IS NOT CONTROLLED WITH YOUR NAUSEA MEDICATION *UNUSUAL SHORTNESS OF BREATH *UNUSUAL BRUISING OR BLEEDING *URINARY PROBLEMS (pain or burning when urinating, or frequent urination) *BOWEL PROBLEMS (unusual diarrhea, constipation, pain near the anus) TENDERNESS IN MOUTH AND THROAT WITH OR WITHOUT PRESENCE OF ULCERS (sore throat, sores in mouth, or a toothache) UNUSUAL RASH, SWELLING OR PAIN  UNUSUAL VAGINAL DISCHARGE OR ITCHING   Items with * indicate a potential emergency and should be followed up as soon as possible or go to the Emergency Department if any problems should occur.  Please show the CHEMOTHERAPY ALERT CARD or IMMUNOTHERAPY ALERT CARD at check-in to the Emergency Department and triage nurse.  Should you have questions after your visit or need to cancel  or reschedule your appointment, please contact [facility] [phone]  and follow the prompts.  Office hours are 8:00 a.m. to 4:30 p.m. Monday - Friday. Please note that voicemails left after 4:00 p.m. may not be returned until the following business day.  We are closed weekends and major holidays. You have access to a nurse at all times for urgent questions. Please call the main number to the clinic [phone] and follow the prompts.  For any non-urgent questions, you may also contact your provider using MyChart. We now offer e-Visits for anyone 18 and older to request care online for non-urgent symptoms. For details visit [email or web address].   Also download the MyChart app! Go to the app store, search "MyChart", open the app, select [facility], and log in with your MyChart username and password.  Due to Covid, a mask is required upon entering the hospital/clinic. If you do not have a mask, one will be given to you upon arrival. For doctor visits, patients may have 1 support person aged 18 or older with them. For treatment visits, patients cannot have anyone with them due to current Covid guidelines and our immunocompromised population.  

## 2022-02-21 NOTE — Progress Notes (Signed)
Informed patient that he can take just '25mg'$  of PO benadryl with his tylenol for his pre-meds now per MD verbal order.  ?

## 2022-02-21 NOTE — Progress Notes (Signed)
Patients today for Velcade injection.  Patient is in satisfactory condition with no new complaints voiced.  Vital signs are stable.  Labs reviewed and all are within treatment parameters.  We will proceed with injection per MD orders.  ? ?Patient tolerated injection with no complaints voiced.  Site clean and dry with no bruising or swelling noted.  No complaints of pain.  Discharged with vital signs stable and no signs or symptoms of distress noted.   ?

## 2022-02-22 DIAGNOSIS — N186 End stage renal disease: Secondary | ICD-10-CM | POA: Diagnosis not present

## 2022-02-22 DIAGNOSIS — Z992 Dependence on renal dialysis: Secondary | ICD-10-CM | POA: Diagnosis not present

## 2022-02-23 ENCOUNTER — Other Ambulatory Visit (HOSPITAL_COMMUNITY): Payer: Self-pay | Admitting: Hematology

## 2022-02-25 ENCOUNTER — Encounter (HOSPITAL_COMMUNITY): Payer: Self-pay | Admitting: Hematology

## 2022-02-25 DIAGNOSIS — N186 End stage renal disease: Secondary | ICD-10-CM | POA: Diagnosis not present

## 2022-02-25 DIAGNOSIS — Z992 Dependence on renal dialysis: Secondary | ICD-10-CM | POA: Diagnosis not present

## 2022-02-26 ENCOUNTER — Institutional Professional Consult (permissible substitution): Payer: Medicare HMO | Admitting: Pulmonary Disease

## 2022-02-27 DIAGNOSIS — Z992 Dependence on renal dialysis: Secondary | ICD-10-CM | POA: Diagnosis not present

## 2022-02-27 DIAGNOSIS — N186 End stage renal disease: Secondary | ICD-10-CM | POA: Diagnosis not present

## 2022-03-01 DIAGNOSIS — N186 End stage renal disease: Secondary | ICD-10-CM | POA: Diagnosis not present

## 2022-03-01 DIAGNOSIS — Z992 Dependence on renal dialysis: Secondary | ICD-10-CM | POA: Diagnosis not present

## 2022-03-03 DIAGNOSIS — N186 End stage renal disease: Secondary | ICD-10-CM | POA: Diagnosis not present

## 2022-03-03 DIAGNOSIS — Z992 Dependence on renal dialysis: Secondary | ICD-10-CM | POA: Diagnosis not present

## 2022-03-04 DIAGNOSIS — Z992 Dependence on renal dialysis: Secondary | ICD-10-CM | POA: Diagnosis not present

## 2022-03-04 DIAGNOSIS — N186 End stage renal disease: Secondary | ICD-10-CM | POA: Diagnosis not present

## 2022-03-05 ENCOUNTER — Telehealth: Payer: Self-pay | Admitting: Cardiology

## 2022-03-05 DIAGNOSIS — N186 End stage renal disease: Secondary | ICD-10-CM | POA: Insufficient documentation

## 2022-03-05 DIAGNOSIS — C9000 Multiple myeloma not having achieved remission: Secondary | ICD-10-CM | POA: Diagnosis not present

## 2022-03-05 DIAGNOSIS — Z8673 Personal history of transient ischemic attack (TIA), and cerebral infarction without residual deficits: Secondary | ICD-10-CM | POA: Diagnosis not present

## 2022-03-05 DIAGNOSIS — Z4902 Encounter for fitting and adjustment of peritoneal dialysis catheter: Secondary | ICD-10-CM | POA: Insufficient documentation

## 2022-03-05 NOTE — Telephone Encounter (Signed)
? ?  Pre-operative Risk Assessment  ?  ?Patient Name: Ernest Barnett  ?DOB: 1956/06/07 ?MRN: [id number]  ? ?  ? ?Request for Surgical Clearance   ? ?Procedure:  Laparoscopic PD Catheter and omentopexy ? ?Date of Surgery:  Clearance 03/21/22                              ?   ?Surgeon:  Wallace Cullens, MD ?Surgeon's Group or Practice Name:  [facility] ?Phone number:  [phone] ?Fax number:  [phone] ?  ?Type of Clearance Requested:   ?- Medical  ?  ?Type of Anesthesia:  General  ?  ?Additional requests/questions:   ? ?Signed, ?Trilby Drummer   ?03/05/2022, 5:00 PM   ?

## 2022-03-06 DIAGNOSIS — Z992 Dependence on renal dialysis: Secondary | ICD-10-CM | POA: Diagnosis not present

## 2022-03-06 DIAGNOSIS — N186 End stage renal disease: Secondary | ICD-10-CM | POA: Diagnosis not present

## 2022-03-06 NOTE — Telephone Encounter (Signed)
? ?  Patient Name: Ernest Barnett  ?DOB: Nov 12, 1955 ?MRN: [id number] ? ?Primary Cardiologist: Carlyle Dolly, MD ? ?Chart reviewed as part of pre-operative protocol coverage. We received clearance request for Laparoscopic PD Catheter and omentopexy under general anesthesia. Per chart review, patient underwent nuclear stress testing in 11/2021 which was abnormal and suggestive of possible ischemia though felt low risk. Given h/o abnormal stress test, will need to involve MD in decision making for surgical clearance. At last OV 01/15/22, patient complained of chronic SOB and was tachycardic (sinus tach) but these were felt stable from baseline. Dr. Harl Bowie - Please route response to [facility] DIV PREOP (the pre-op pool). Thank you. ? ?Charlie Pitter, PA-C ?03/06/2022, 10:15 AM ?  ?

## 2022-03-07 ENCOUNTER — Inpatient Hospital Stay (HOSPITAL_COMMUNITY): Payer: Medicare HMO

## 2022-03-07 ENCOUNTER — Inpatient Hospital Stay (HOSPITAL_COMMUNITY): Payer: Medicare HMO | Attending: Hematology

## 2022-03-07 VITALS — BP 98/77 | HR 95 | Temp 98.4°F | Resp 20 | Ht 66.0 in | Wt 265.2 lb

## 2022-03-07 DIAGNOSIS — C9000 Multiple myeloma not having achieved remission: Secondary | ICD-10-CM | POA: Diagnosis present

## 2022-03-07 DIAGNOSIS — Z5112 Encounter for antineoplastic immunotherapy: Secondary | ICD-10-CM | POA: Diagnosis present

## 2022-03-07 DIAGNOSIS — D472 Monoclonal gammopathy: Secondary | ICD-10-CM

## 2022-03-07 LAB — COMPREHENSIVE METABOLIC PANEL
ALT: 22 U/L (ref 0–44)
AST: 16 U/L (ref 15–41)
Albumin: 3.9 g/dL (ref 3.5–5.0)
Alkaline Phosphatase: 29 U/L — ABNORMAL LOW (ref 38–126)
Anion gap: 11 (ref 5–15)
BUN: 35 mg/dL — ABNORMAL HIGH (ref 8–23)
CO2: 25 mmol/L (ref 22–32)
Calcium: 9.4 mg/dL (ref 8.9–10.3)
Chloride: 102 mmol/L (ref 98–111)
Creatinine, Ser: 4.03 mg/dL — ABNORMAL HIGH (ref 0.61–1.24)
GFR, Estimated: 16 mL/min — ABNORMAL LOW (ref >60)
Glucose, Bld: 121 mg/dL — ABNORMAL HIGH (ref 70–99)
Potassium: 3.5 mmol/L (ref 3.5–5.1)
Sodium: 138 mmol/L (ref 135–145)
Total Bilirubin: 0.9 mg/dL (ref 0.3–1.2)
Total Protein: 7.1 g/dL (ref 6.5–8.1)

## 2022-03-07 LAB — CBC WITH DIFFERENTIAL/PLATELET
Abs Immature Granulocytes: 0.02 10*3/uL (ref 0.00–0.07)
Basophils Absolute: 0 10*3/uL (ref 0.0–0.1)
Basophils Relative: 1 %
Eosinophils Absolute: 0.1 10*3/uL (ref 0.0–0.5)
Eosinophils Relative: 1 %
HCT: 34.5 % — ABNORMAL LOW (ref 39.0–52.0)
Hemoglobin: 11.2 g/dL — ABNORMAL LOW (ref 13.0–17.0)
Immature Granulocytes: 0 %
Lymphocytes Relative: 38 %
Lymphs Abs: 2.4 10*3/uL (ref 0.7–4.0)
MCH: 30.9 pg (ref 26.0–34.0)
MCHC: 32.5 g/dL (ref 30.0–36.0)
MCV: 95.3 fL (ref 80.0–100.0)
Monocytes Absolute: 0.6 10*3/uL (ref 0.1–1.0)
Monocytes Relative: 9 %
Neutro Abs: 3.3 10*3/uL (ref 1.7–7.7)
Neutrophils Relative %: 51 %
Platelets: 183 10*3/uL (ref 150–400)
RBC: 3.62 MIL/uL — ABNORMAL LOW (ref 4.22–5.81)
RDW: 15.1 % (ref 11.5–15.5)
WBC: 6.4 10*3/uL (ref 4.0–10.5)
nRBC: 0 % (ref 0.0–0.2)

## 2022-03-07 LAB — MAGNESIUM: Magnesium: 2 mg/dL (ref 1.7–2.4)

## 2022-03-07 MED ORDER — BORTEZOMIB CHEMO SQ INJECTION 3.5 MG (2.5MG/ML)
1.0000 mg/m2 | Freq: Once | INTRAMUSCULAR | Status: AC
  Administered 2022-03-07: 2.25 mg via SUBCUTANEOUS
  Filled 2022-03-07: qty 0.9

## 2022-03-07 MED ORDER — DARATUMUMAB-HYALURONIDASE-FIHJ 1800-30000 MG-UT/15ML ~~LOC~~ SOLN
1800.0000 mg | Freq: Once | SUBCUTANEOUS | Status: AC
  Administered 2022-03-07: 1800 mg via SUBCUTANEOUS
  Filled 2022-03-07: qty 15

## 2022-03-07 NOTE — Telephone Encounter (Signed)
? ? ?  Patient Name: NTHONY LEFFERTS  ?DOB: [birth year] ?MRN: [id number] ? ?Primary Cardiologist: Carlyle Dolly, MD ? ?Chart reviewed as part of pre-operative protocol coverage. Given past medical history and time since last visit, based on ACC/AHA guidelines, SAMPSON SELF would be at acceptable risk for the planned procedure without further cardiovascular testing.  ? ?The patient was advised that if he develops new symptoms prior to surgery to contact our office to arrange for a follow-up visit, and he verbalized understanding. ? ?I will route this recommendation to the requesting party via Epic fax function and remove from pre-op pool. ? ?Please call with questions. ? ?Jimmy Picket, NP ?03/07/2022, 3:57 PM  ?

## 2022-03-07 NOTE — Telephone Encounter (Signed)
Low risk nuclear study Jan 2023, recommend proceeding with surgery as planned ? ?Zandra Abts MD ?

## 2022-03-07 NOTE — Patient Instructions (Signed)
[facility]  Discharge Instructions: ?Thank you for choosing [facility] to provide your oncology and hematology care.  ?If you have a lab appointment with the [facility], please come in thru the Main Entrance and check in at the main information desk. ? ?Wear comfortable clothing and clothing appropriate for easy access to any Portacath or PICC line.  ? ?We strive to give you quality time with your provider. You may need to reschedule your appointment if you arrive late (15 or more minutes).  Arriving late affects you and other patients whose appointments are after yours.  Also, if you miss three or more appointments without notifying the office, you may be dismissed from the clinic at the provider?s discretion.    ?  ?For prescription refill requests, have your pharmacy contact our office and allow 72 hours for refills to be completed.   ? ?Today you received the following chemotherapy and/or immunotherapy agents Dara SQ and Velcade injections. ?  ? ?BELOW ARE SYMPTOMS THAT SHOULD BE REPORTED IMMEDIATELY: ?*FEVER GREATER THAN 100.4 F (38 ?C) OR HIGHER ?*CHILLS OR SWEATING ?*NAUSEA AND VOMITING THAT IS NOT CONTROLLED WITH YOUR NAUSEA MEDICATION ?*UNUSUAL SHORTNESS OF BREATH ?*UNUSUAL BRUISING OR BLEEDING ?*URINARY PROBLEMS (pain or burning when urinating, or frequent urination) ?*BOWEL PROBLEMS (unusual diarrhea, constipation, pain near the anus) ?TENDERNESS IN MOUTH AND THROAT WITH OR WITHOUT PRESENCE OF ULCERS (sore throat, sores in mouth, or a toothache) ?UNUSUAL RASH, SWELLING OR PAIN  ?UNUSUAL VAGINAL DISCHARGE OR ITCHING  ? ?Items with * indicate a potential emergency and should be followed up as soon as possible or go to the Emergency Department if any problems should occur. ? ?Please show the CHEMOTHERAPY ALERT CARD or IMMUNOTHERAPY ALERT CARD at check-in to the Emergency Department and triage nurse. ? ?Should you have questions after your visit or need to cancel or reschedule  your appointment, please contact [facility] [phone]  and follow the prompts.  Office hours are 8:00 a.m. to 4:30 p.m. Monday - Friday. Please note that voicemails left after 4:00 p.m. may not be returned until the following business day.  We are closed weekends and major holidays. You have access to a nurse at all times for urgent questions. Please call the main number to the clinic [phone] and follow the prompts. ? ?For any non-urgent questions, you may also contact your provider using MyChart. We now offer e-Visits for anyone 73 and older to request care online for non-urgent symptoms. For details visit [email or web address]. ?  ?Also download the MyChart app! Go to the app store, search "MyChart", open the app, select [facility], and log in with your MyChart username and password. ? ?Due to Covid, a mask is required upon entering the hospital/clinic. If you do not have a mask, one will be given to you upon arrival. For doctor visits, patients may have 1 support person aged 70 or older with them. For treatment visits, patients cannot have anyone with them due to current Covid guidelines and our immunocompromised population.  ?

## 2022-03-07 NOTE — Progress Notes (Signed)
Pt presents today for Dara SQ and Velcade per provider's order. Vital signs and labs WNL for treatment. Okay to proceed with treatment today. Pt took pre-meds at home prior to arrival. ? ?Dara SQ and Velcade injections given today per MD orders. Tolerated infusion without adverse affects. Vital signs stable. No complaints at this time. Discharged from clinic ambulatory in stable condition. Alert and oriented x 3. F/U with [facility] as scheduled.   ?

## 2022-03-08 DIAGNOSIS — Z992 Dependence on renal dialysis: Secondary | ICD-10-CM | POA: Diagnosis not present

## 2022-03-08 DIAGNOSIS — N186 End stage renal disease: Secondary | ICD-10-CM | POA: Diagnosis not present

## 2022-03-11 DIAGNOSIS — Z992 Dependence on renal dialysis: Secondary | ICD-10-CM | POA: Diagnosis not present

## 2022-03-11 DIAGNOSIS — N186 End stage renal disease: Secondary | ICD-10-CM | POA: Diagnosis not present

## 2022-03-13 DIAGNOSIS — Z992 Dependence on renal dialysis: Secondary | ICD-10-CM | POA: Diagnosis not present

## 2022-03-13 DIAGNOSIS — N186 End stage renal disease: Secondary | ICD-10-CM | POA: Diagnosis not present

## 2022-03-14 ENCOUNTER — Inpatient Hospital Stay (HOSPITAL_COMMUNITY): Payer: Medicare HMO

## 2022-03-14 ENCOUNTER — Other Ambulatory Visit: Payer: Self-pay | Admitting: Internal Medicine

## 2022-03-14 ENCOUNTER — Other Ambulatory Visit (INDEPENDENT_AMBULATORY_CARE_PROVIDER_SITE_OTHER): Payer: Self-pay | Admitting: Internal Medicine

## 2022-03-14 VITALS — BP 111/77 | HR 81 | Temp 97.2°F | Resp 20 | Wt 261.3 lb

## 2022-03-14 DIAGNOSIS — Z5112 Encounter for antineoplastic immunotherapy: Secondary | ICD-10-CM | POA: Diagnosis not present

## 2022-03-14 DIAGNOSIS — C9000 Multiple myeloma not having achieved remission: Secondary | ICD-10-CM | POA: Diagnosis not present

## 2022-03-14 DIAGNOSIS — D472 Monoclonal gammopathy: Secondary | ICD-10-CM

## 2022-03-14 LAB — COMPREHENSIVE METABOLIC PANEL
ALT: 21 U/L (ref 0–44)
AST: 13 U/L — ABNORMAL LOW (ref 15–41)
Albumin: 4 g/dL (ref 3.5–5.0)
Alkaline Phosphatase: 31 U/L — ABNORMAL LOW (ref 38–126)
Anion gap: 10 (ref 5–15)
BUN: 36 mg/dL — ABNORMAL HIGH (ref 8–23)
CO2: 24 mmol/L (ref 22–32)
Calcium: 9 mg/dL (ref 8.9–10.3)
Chloride: 104 mmol/L (ref 98–111)
Creatinine, Ser: 3.86 mg/dL — ABNORMAL HIGH (ref 0.61–1.24)
GFR, Estimated: 17 mL/min — ABNORMAL LOW (ref >60)
Glucose, Bld: 108 mg/dL — ABNORMAL HIGH (ref 70–99)
Potassium: 3.4 mmol/L — ABNORMAL LOW (ref 3.5–5.1)
Sodium: 138 mmol/L (ref 135–145)
Total Bilirubin: 0.8 mg/dL (ref 0.3–1.2)
Total Protein: 7.1 g/dL (ref 6.5–8.1)

## 2022-03-14 LAB — MAGNESIUM: Magnesium: 2 mg/dL (ref 1.7–2.4)

## 2022-03-14 LAB — CBC WITH DIFFERENTIAL/PLATELET
Abs Immature Granulocytes: 0.02 10*3/uL (ref 0.00–0.07)
Basophils Absolute: 0 10*3/uL (ref 0.0–0.1)
Basophils Relative: 1 %
Eosinophils Absolute: 0.2 10*3/uL (ref 0.0–0.5)
Eosinophils Relative: 3 %
HCT: 32.3 % — ABNORMAL LOW (ref 39.0–52.0)
Hemoglobin: 11 g/dL — ABNORMAL LOW (ref 13.0–17.0)
Immature Granulocytes: 0 %
Lymphocytes Relative: 42 %
Lymphs Abs: 2.5 10*3/uL (ref 0.7–4.0)
MCH: 32.1 pg (ref 26.0–34.0)
MCHC: 34.1 g/dL (ref 30.0–36.0)
MCV: 94.2 fL (ref 80.0–100.0)
Monocytes Absolute: 0.6 10*3/uL (ref 0.1–1.0)
Monocytes Relative: 11 %
Neutro Abs: 2.6 10*3/uL (ref 1.7–7.7)
Neutrophils Relative %: 43 %
Platelets: 163 10*3/uL (ref 150–400)
RBC: 3.43 MIL/uL — ABNORMAL LOW (ref 4.22–5.81)
RDW: 15.6 % — ABNORMAL HIGH (ref 11.5–15.5)
WBC: 5.9 10*3/uL (ref 4.0–10.5)
nRBC: 0 % (ref 0.0–0.2)

## 2022-03-14 LAB — LACTATE DEHYDROGENASE: LDH: 222 U/L — ABNORMAL HIGH (ref 98–192)

## 2022-03-14 MED ORDER — BORTEZOMIB CHEMO SQ INJECTION 3.5 MG (2.5MG/ML)
1.0000 mg/m2 | Freq: Once | INTRAMUSCULAR | Status: AC
  Administered 2022-03-14: 2.25 mg via SUBCUTANEOUS
  Filled 2022-03-14: qty 0.9

## 2022-03-14 MED ORDER — TAMSULOSIN HCL 0.4 MG PO CAPS: 0.4000 mg | ORAL_CAPSULE | Freq: Every day | ORAL | 0 refills | Status: DC

## 2022-03-14 NOTE — Patient Instructions (Signed)
[facility]  Discharge Instructions: ?Thank you for choosing [facility] to provide your oncology and hematology care.  ?If you have a lab appointment with the [facility], please come in thru the Main Entrance and check in at the main information desk. ? ?Wear comfortable clothing and clothing appropriate for easy access to any Portacath or PICC line.  ? ?We strive to give you quality time with your provider. You may need to reschedule your appointment if you arrive late (15 or more minutes).  Arriving late affects you and other patients whose appointments are after yours.  Also, if you miss three or more appointments without notifying the office, you may be dismissed from the clinic at the provider?s discretion.    ?  ?For prescription refill requests, have your pharmacy contact our office and allow 72 hours for refills to be completed.   ? ?Today you received the following chemotherapy and/or immunotherapy agents velcade    ?  ?To help prevent nausea and vomiting after your treatment, we encourage you to take your nausea medication as directed. ? ?BELOW ARE SYMPTOMS THAT SHOULD BE REPORTED IMMEDIATELY: ?*FEVER GREATER THAN 100.4 F (38 ?C) OR HIGHER ?*CHILLS OR SWEATING ?*NAUSEA AND VOMITING THAT IS NOT CONTROLLED WITH YOUR NAUSEA MEDICATION ?*UNUSUAL SHORTNESS OF BREATH ?*UNUSUAL BRUISING OR BLEEDING ?*URINARY PROBLEMS (pain or burning when urinating, or frequent urination) ?*BOWEL PROBLEMS (unusual diarrhea, constipation, pain near the anus) ?TENDERNESS IN MOUTH AND THROAT WITH OR WITHOUT PRESENCE OF ULCERS (sore throat, sores in mouth, or a toothache) ?UNUSUAL RASH, SWELLING OR PAIN  ?UNUSUAL VAGINAL DISCHARGE OR ITCHING  ? ?Items with * indicate a potential emergency and should be followed up as soon as possible or go to the Emergency Department if any problems should occur. ? ?Please show the CHEMOTHERAPY ALERT CARD or IMMUNOTHERAPY ALERT CARD at check-in to the Emergency  Department and triage nurse. ? ?Should you have questions after your visit or need to cancel or reschedule your appointment, please contact [facility] [phone]  and follow the prompts.  Office hours are 8:00 a.m. to 4:30 p.m. Monday - Friday. Please note that voicemails left after 4:00 p.m. may not be returned until the following business day.  We are closed weekends and major holidays. You have access to a nurse at all times for urgent questions. Please call the main number to the clinic [phone] and follow the prompts. ? ?For any non-urgent questions, you may also contact your provider using MyChart. We now offer e-Visits for anyone 6 and older to request care online for non-urgent symptoms. For details visit [email or web address]. ?  ?Also download the MyChart app! Go to the app store, search "MyChart", open the app, select [facility], and log in with your MyChart username and password. ? ?Due to Covid, a mask is required upon entering the hospital/clinic. If you do not have a mask, one will be given to you upon arrival. For doctor visits, patients may have 1 support person aged 54 or older with them. For treatment visits, patients cannot have anyone with them due to current Covid guidelines and our immunocompromised population.  ?

## 2022-03-14 NOTE — Progress Notes (Signed)
Patient took pre-meds at home today.  ? ?Treatment given per orders. Patient tolerated it well without problems. Vitals stable and discharged home from clinic ambulatory. Follow up as scheduled. ? ?

## 2022-03-15 DIAGNOSIS — Z992 Dependence on renal dialysis: Secondary | ICD-10-CM | POA: Diagnosis not present

## 2022-03-15 DIAGNOSIS — N186 End stage renal disease: Secondary | ICD-10-CM | POA: Diagnosis not present

## 2022-03-15 LAB — KAPPA/LAMBDA LIGHT CHAINS
Kappa free light chain: 39.4 mg/L — ABNORMAL HIGH (ref 3.3–19.4)
Kappa, lambda light chain ratio: 4.19 — ABNORMAL HIGH (ref 0.26–1.65)
Lambda free light chains: 9.4 mg/L (ref 5.7–26.3)

## 2022-03-18 DIAGNOSIS — N186 End stage renal disease: Secondary | ICD-10-CM | POA: Diagnosis not present

## 2022-03-18 DIAGNOSIS — Z992 Dependence on renal dialysis: Secondary | ICD-10-CM | POA: Diagnosis not present

## 2022-03-18 LAB — PROTEIN ELECTROPHORESIS, SERUM
A/G Ratio: 1.5 (ref 0.7–1.7)
Albumin ELP: 3.8 g/dL (ref 2.9–4.4)
Alpha-1-Globulin: 0.2 g/dL (ref 0.0–0.4)
Alpha-2-Globulin: 0.8 g/dL (ref 0.4–1.0)
Beta Globulin: 0.8 g/dL (ref 0.7–1.3)
Gamma Globulin: 0.7 g/dL (ref 0.4–1.8)
Globulin, Total: 2.6 g/dL (ref 2.2–3.9)
M-Spike, %: 0.2 g/dL — ABNORMAL HIGH
Total Protein ELP: 6.4 g/dL (ref 6.0–8.5)

## 2022-03-20 DIAGNOSIS — N186 End stage renal disease: Secondary | ICD-10-CM | POA: Diagnosis not present

## 2022-03-20 DIAGNOSIS — Z992 Dependence on renal dialysis: Secondary | ICD-10-CM | POA: Diagnosis not present

## 2022-03-22 DIAGNOSIS — N186 End stage renal disease: Secondary | ICD-10-CM | POA: Diagnosis not present

## 2022-03-22 DIAGNOSIS — Z992 Dependence on renal dialysis: Secondary | ICD-10-CM | POA: Diagnosis not present

## 2022-03-23 DIAGNOSIS — I509 Heart failure, unspecified: Secondary | ICD-10-CM | POA: Diagnosis not present

## 2022-03-23 DIAGNOSIS — E211 Secondary hyperparathyroidism, not elsewhere classified: Secondary | ICD-10-CM | POA: Diagnosis not present

## 2022-03-23 DIAGNOSIS — G629 Polyneuropathy, unspecified: Secondary | ICD-10-CM | POA: Diagnosis not present

## 2022-03-23 DIAGNOSIS — Z992 Dependence on renal dialysis: Secondary | ICD-10-CM | POA: Diagnosis not present

## 2022-03-23 DIAGNOSIS — J449 Chronic obstructive pulmonary disease, unspecified: Secondary | ICD-10-CM | POA: Diagnosis not present

## 2022-03-23 DIAGNOSIS — Z6841 Body Mass Index (BMI) 40.0 and over, adult: Secondary | ICD-10-CM | POA: Diagnosis not present

## 2022-03-23 DIAGNOSIS — N186 End stage renal disease: Secondary | ICD-10-CM | POA: Diagnosis not present

## 2022-03-25 DIAGNOSIS — Z992 Dependence on renal dialysis: Secondary | ICD-10-CM | POA: Diagnosis not present

## 2022-03-25 DIAGNOSIS — N186 End stage renal disease: Secondary | ICD-10-CM | POA: Diagnosis not present

## 2022-03-28 ENCOUNTER — Inpatient Hospital Stay (HOSPITAL_COMMUNITY): Payer: Medicare HMO

## 2022-03-28 ENCOUNTER — Inpatient Hospital Stay (HOSPITAL_BASED_OUTPATIENT_CLINIC_OR_DEPARTMENT_OTHER): Payer: Medicare HMO | Admitting: Hematology

## 2022-03-28 ENCOUNTER — Encounter (HOSPITAL_COMMUNITY): Payer: Self-pay | Admitting: Hematology

## 2022-03-28 VITALS — BP 115/72 | HR 88 | Temp 97.5°F | Resp 18 | Ht 66.0 in | Wt 265.0 lb

## 2022-03-28 DIAGNOSIS — C9000 Multiple myeloma not having achieved remission: Secondary | ICD-10-CM | POA: Diagnosis not present

## 2022-03-28 DIAGNOSIS — D472 Monoclonal gammopathy: Secondary | ICD-10-CM

## 2022-03-28 DIAGNOSIS — Z5112 Encounter for antineoplastic immunotherapy: Secondary | ICD-10-CM | POA: Diagnosis not present

## 2022-03-28 LAB — CBC WITH DIFFERENTIAL/PLATELET
Abs Immature Granulocytes: 0.01 10*3/uL (ref 0.00–0.07)
Basophils Absolute: 0 10*3/uL (ref 0.0–0.1)
Basophils Relative: 1 %
Eosinophils Absolute: 0.1 10*3/uL (ref 0.0–0.5)
Eosinophils Relative: 2 %
HCT: 32.3 % — ABNORMAL LOW (ref 39.0–52.0)
Hemoglobin: 10.6 g/dL — ABNORMAL LOW (ref 13.0–17.0)
Immature Granulocytes: 0 %
Lymphocytes Relative: 41 %
Lymphs Abs: 2.2 10*3/uL (ref 0.7–4.0)
MCH: 31.8 pg (ref 26.0–34.0)
MCHC: 32.8 g/dL (ref 30.0–36.0)
MCV: 97 fL (ref 80.0–100.0)
Monocytes Absolute: 0.4 10*3/uL (ref 0.1–1.0)
Monocytes Relative: 8 %
Neutro Abs: 2.6 10*3/uL (ref 1.7–7.7)
Neutrophils Relative %: 48 %
Platelets: 175 10*3/uL (ref 150–400)
RBC: 3.33 MIL/uL — ABNORMAL LOW (ref 4.22–5.81)
RDW: 15.9 % — ABNORMAL HIGH (ref 11.5–15.5)
WBC: 5.3 10*3/uL (ref 4.0–10.5)
nRBC: 0 % (ref 0.0–0.2)

## 2022-03-28 LAB — COMPREHENSIVE METABOLIC PANEL
ALT: 20 U/L (ref 0–44)
AST: 14 U/L — ABNORMAL LOW (ref 15–41)
Albumin: 3.7 g/dL (ref 3.5–5.0)
Alkaline Phosphatase: 30 U/L — ABNORMAL LOW (ref 38–126)
Anion gap: 4 — ABNORMAL LOW (ref 5–15)
BUN: 29 mg/dL — ABNORMAL HIGH (ref 8–23)
CO2: 21 mmol/L — ABNORMAL LOW (ref 22–32)
Calcium: 8.8 mg/dL — ABNORMAL LOW (ref 8.9–10.3)
Chloride: 111 mmol/L (ref 98–111)
Creatinine, Ser: 4.12 mg/dL — ABNORMAL HIGH (ref 0.61–1.24)
GFR, Estimated: 15 mL/min — ABNORMAL LOW (ref >60)
Glucose, Bld: 113 mg/dL — ABNORMAL HIGH (ref 70–99)
Potassium: 3.7 mmol/L (ref 3.5–5.1)
Sodium: 136 mmol/L (ref 135–145)
Total Bilirubin: 0.6 mg/dL (ref 0.3–1.2)
Total Protein: 6.6 g/dL (ref 6.5–8.1)

## 2022-03-28 LAB — MAGNESIUM: Magnesium: 2.2 mg/dL (ref 1.7–2.4)

## 2022-03-28 MED ORDER — ACYCLOVIR 200 MG PO CAPS: 200.0000 mg | ORAL_CAPSULE | Freq: Two times a day (BID) | ORAL | 6 refills | Status: DC

## 2022-03-28 MED ORDER — BORTEZOMIB CHEMO SQ INJECTION 3.5 MG (2.5MG/ML)
1.0000 mg/m2 | Freq: Once | INTRAMUSCULAR | Status: AC
  Administered 2022-03-28: 2.25 mg via SUBCUTANEOUS
  Filled 2022-03-28: qty 0.9

## 2022-03-28 MED ORDER — DARATUMUMAB-HYALURONIDASE-FIHJ 1800-30000 MG-UT/15ML ~~LOC~~ SOLN
1800.0000 mg | Freq: Once | SUBCUTANEOUS | Status: AC
  Administered 2022-03-28: 1800 mg via SUBCUTANEOUS
  Filled 2022-03-28: qty 15

## 2022-03-28 NOTE — Progress Notes (Signed)
Pt presents today for Daratumumab SQ and Velcade injection per provider's order. Labs and vital signs WNL for treatment. Okay to proceed with treatment today per Dr.K. Pt took pre-meds at home prior to arrival.  Daratumumab SQ and Velcade injection given today per MD orders. Tolerated infusion without adverse affects. Vital signs stable. No complaints at this time. Discharged from clinic ambulatory in stable condition. Alert and oriented x 3. F/U with [facility] as scheduled.

## 2022-03-28 NOTE — Progress Notes (Signed)
[facility] [place], [place] [address]   CLINIC:  Medical Oncology/Hematology  PCP:  Lindell Spar, MD [address] / [place] [address] [phone]   REASON FOR VISIT:  Follow-up for multiple myeloma  PRIOR THERAPY: CyBorD  NGS Results: not done  CURRENT THERAPY: Daratumumab SQ + Bortezomib + Dexamethasone (DaraVd) q21d / Daratumumab SQ q28d   BRIEF ONCOLOGIC HISTORY:  Oncology History  Multiple myeloma ([facility])  03/06/2021 Initial Diagnosis   Multiple myeloma ([facility])    03/06/2021 Cancer Staging   Staging form: Plasma Cell Myeloma and Plasma Cell Disorders, AJCC 8th Edition - Clinical stage from 03/06/2021: RISS Stage II (Beta-2-microglobulin (mg/L): 5.5, Albumin (g/dL): 3.5, ISS: Stage III, High-risk cytogenetics: Absent, LDH: Normal) - Signed by Derek Jack, MD on 03/06/2021 Stage prefix: Initial diagnosis Beta 2 microglobulin range (mg/L): Greater than or equal to 5.5 Albumin range (g/dL): Greater than or equal to 3.5 Cytogenetics: No abnormalities Pretreatment monoclonal protein level in serum (M spike) (g/dL): 2 Pretreatment serum free kappa light chain level (g/L): 794 Pretreatment serum free lambda light chain level (g/L): 15.9    03/14/2021 - 07/04/2021 Chemotherapy   Patient is on Treatment Plan : MULTIPLE MYELOMA CyBorD - Weekly Bortezomib      08/30/2021 -  Chemotherapy   Patient is on Treatment Plan : MYELOMA RELAPSED / REFRACTORY Daratumumab SQ + Bortezomib + Dexamethasone (DaraVd) q21d / Daratumumab SQ q28d         CANCER STAGING:  Cancer Staging  Multiple myeloma ([facility]) Staging form: Plasma Cell Myeloma and Plasma Cell Disorders, AJCC 8th Edition - Clinical stage from 03/06/2021: RISS Stage II (Beta-2-microglobulin (mg/L): 5.5, Albumin (g/dL): 3.5, ISS: Stage III, High-risk cytogenetics: Absent, LDH: Normal) - Signed by Derek Jack, MD on 03/06/2021   INTERVAL HISTORY:  Ernest Barnett, a 66 y.o. male,  returns for routine follow-up and consideration for next cycle of chemotherapy. Darivs was last seen on 02/14/2022.  Due for cycle #11 of DaraVd today.   Overall, he tells me he has been feeling pretty well. He denies numbness/tingling in his hands and feet. He denies any infections in the past month. He does not take acyclovir. He continues to take Gabapentin and torsemide.   Overall, he feels ready for next cycle of chemo today.    REVIEW OF SYSTEMS:  Review of Systems  Constitutional:  Negative for appetite change and fatigue.  Neurological:  Negative for numbness.  All other systems reviewed and are negative.  PAST MEDICAL/SURGICAL HISTORY:  Past Medical History:  Diagnosis Date   Chronic kidney disease    COPD (chronic obstructive pulmonary disease) (HCC)    CVA (cerebral vascular accident) ([facility]) 2013   Cystoid macular edema of left eye 03/07/2021   GERD (gastroesophageal reflux disease)    HOH (hard of hearing)    Hypercholesteremia    Hypertension    Left epiretinal membrane 03/07/2021   Pseudophakia 03/07/2021   Stroke ([facility]) 03/24/2012   left sided weakness   Vitamin D deficiency    Past Surgical History:  Procedure Laterality Date   A/V FISTULAGRAM Right 01/29/2022   Procedure: A/V Fistulagram;  Surgeon: Serafina Mitchell, MD;  Location: [facility];  Service: Cardiovascular;  Laterality: Right;   AV FISTULA PLACEMENT Right 02/08/2021   Procedure: RIGHT ARM ARTERIOVENOUS (AV) FISTULA CREATION;  Surgeon: [name], MD;  Location: [facility];  Service: Vascular;  Laterality: Right;   BIOPSY  11/12/2018   Procedure:  BIOPSY;  Surgeon: Rogene Houston, MD;  [facility];  Service: Endoscopy;;  colon   BIOPSY  05/10/2020   Procedure: BIOPSY;  Surgeon: Rogene Houston, MD;  [facility];  Service: Endoscopy;;  esophagus   CATARACT EXTRACTION     right eye   CATARACT EXTRACTION [facility]  10/08/2012   Procedure: CATARACT EXTRACTION PHACO AND INTRAOCULAR  LENS PLACEMENT ([facility]);  Surgeon: Tonny Branch, MD;  Location: [facility];  Service: Ophthalmology;  Laterality: Left;  CDE:6.64   CHOLECYSTECTOMY     [place]   COLONOSCOPY N/A 11/12/2018   Procedure: COLONOSCOPY;  Surgeon: Rogene Houston, MD;  [facility];  Service: Endoscopy;  Laterality: N/A;  1030   ELBOW FRACTURE SURGERY     left   ESOPHAGEAL DILATION N/A 11/12/2018   Procedure: ESOPHAGEAL DILATION;  Surgeon: Rogene Houston, MD;  [facility];  Service: Endoscopy;  Laterality: N/A;   ESOPHAGEAL DILATION N/A 05/10/2020   Procedure: ESOPHAGEAL DILATION;  Surgeon: Rogene Houston, MD;  [facility];  Service: Endoscopy;  Laterality: N/A;   ESOPHAGOGASTRODUODENOSCOPY N/A 11/12/2018   Procedure: ESOPHAGOGASTRODUODENOSCOPY (EGD);  Surgeon: Rogene Houston, MD;  [facility];  Service: Endoscopy;  Laterality: N/A;   ESOPHAGOGASTRODUODENOSCOPY N/A 05/10/2020   Procedure: ESOPHAGOGASTRODUODENOSCOPY (EGD);  Surgeon: Rogene Houston, MD;  [facility];  Service: Endoscopy;  Laterality: N/A;  210   HERNIA REPAIR     right inguinal   HYDROCELE EXCISION / REPAIR     IR FLUORO GUIDE CV LINE LEFT  08/17/2021   IR US GUIDE VASC ACCESS LEFT  08/17/2021   LIGATION OF COMPETING BRANCHES OF ARTERIOVENOUS FISTULA Right 09/11/2021   Procedure: LIGATION OF COMPETING BRANCHES OF RIGHT ARM ARTERIOVENOUS FISTULA;  Surgeon: [name], MD;  Location: [facility];  Service: Vascular;  Laterality: Right;   PERIPHERAL VASCULAR BALLOON ANGIOPLASTY Right 01/29/2022   Procedure: PERIPHERAL VASCULAR BALLOON ANGIOPLASTY;  Surgeon: Serafina Mitchell, MD;  Location: [facility];  Service: Cardiovascular;  Laterality: Right;  arm fistula   POLYPECTOMY  11/12/2018   Procedure: POLYPECTOMY;  Surgeon: Rogene Houston, MD;  [facility];  Service: Endoscopy;;  colon    RETINAL DETACHMENT SURGERY Left 2019   SPLENECTOMY, TOTAL     TONSILLECTOMY     VOCAL CORD INJECTION      removal of polyp-2005    SOCIAL HISTORY:  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  Tobacco Use   Smoking status: Former    Packs/day: 3.00    Years: 30.00    Pack years: 90.00    Types: Cigarettes   Smokeless tobacco: Current   Tobacco comments:    Currently vape  Vaping Use   Vaping Use: Every day   Substances: Nicotine  Substance and Sexual Activity   Alcohol use: Yes    Alcohol/week: 1.0 - 2.0 standard drink    Types: 1 - 2 Cans of beer per week    Comment: Occasionally   Drug use: No   Sexual activity: Not Currently    Birth control/protection: Abstinence, None  Other Topics Concern   Not  on file  Social History Narrative   2 children, both nearby   Social Determinants of Health   Financial Resource Strain: Low Risk    Difficulty of Paying Living Expenses: Not hard at all  Food Insecurity: No Food Insecurity   Worried About Charity fundraiser in the Last Year: Never true   Arboriculturist in the Last Year: Never true  Transportation Needs: No Transportation Needs   Lack of Transportation (Medical): No   Lack of Transportation (Non-Medical): No  Physical Activity: Insufficiently Active   Days of Exercise per Week: 3 days   Minutes of Exercise per Session: 30 min  Stress: No Stress Concern Present   Feeling of Stress : Only a little  Social Connections: Socially Isolated   Frequency of Communication with Friends and Family: More than three times a week   Frequency of Social Gatherings with Friends and Family: More than three times a week   Attends Religious Services: Never   Marine scientist or Organizations: No   Attends Music therapist: Never   Marital Status: Divorced   Human resources officer Violence: Not At Risk   Fear of Current or Ex-Partner: No   Emotionally Abused: No   Physically Abused: No   Sexually Abused: No    FAMILY HISTORY:  History reviewed. No pertinent family history.  CURRENT MEDICATIONS:  Current Outpatient Medications  Medication Sig Dispense Refill   albuterol (VENTOLIN HFA) 108 (90 Base) MCG/ACT inhaler Inhale 2 puffs into the lungs every 6 (six) hours as needed for wheezing or shortness of breath. 8 g 0   aspirin EC 81 MG tablet Take 1 tablet (81 mg total) by mouth daily. 30 tablet 11   bortezomib IV (VELCADE) 3.5 MG injection Inject 1.5 mg/m2 into the vein once a week.     budesonide-formoterol (SYMBICORT) 80-4.5 MCG/ACT inhaler Inhale 2 puffs into the lungs 2 (two) times daily as needed (shortness of breath).     calcitRIOL (ROCALTROL) 0.25 MCG capsule Take 0.25 mcg by mouth daily.     cholecalciferol (VITAMIN D3) 25 MCG (1000 UNIT) tablet Take 2,000 Units by mouth daily.     dexamethasone (DECADRON) 4 MG tablet Take 2.5 tablets (10 mg total) by mouth once a week. Take at home along with Tylenol and Benadryl weekly on the day of daratumumab injection. 30 tablet 2   docusate sodium (COLACE) 100 MG capsule Take 100 mg by mouth daily.     gabapentin (NEURONTIN) 300 MG capsule Take 1 capsule by mouth twice daily 60 capsule 6   midodrine (PROAMATINE) 10 MG tablet Take by mouth.     Omega-3 Fatty Acids (FISH OIL) 1000 MG CAPS Take 1,000 mg by mouth in the morning, at noon, and at bedtime.      oxyCODONE (OXY IR/ROXICODONE) 5 MG immediate release tablet Take 1 tablet (5 mg total) by mouth 2 (two) times daily as needed for severe pain. 30 tablet 0   pantoprazole (PROTONIX) 40 MG tablet Take 1 tablet (40 mg total) by mouth 2 (two) times daily. (Patient not taking: Reported on 03/28/2022) 180 tablet 3   polyethylene glycol (MIRALAX / GLYCOLAX) packet Take 17 g by mouth daily. Patient states that he takes as needed. 30 each 5   pravastatin  (PRAVACHOL) 80 MG tablet Take 1 tablet (80 mg total) by mouth daily. 90 tablet 3   ranolazine (RANEXA) 500 MG 12 hr tablet Take 1 tablet (500 mg total) by mouth daily. [place]  tablet 3   sildenafil (REVATIO) 20 MG tablet Take 1 tablet (20 mg total) by mouth 5 (five) times daily as needed. 30 tablet 2   sodium bicarbonate 325 MG tablet Take 325 mg by mouth 3 (three) times daily.     tamsulosin (FLOMAX) 0.4 MG CAPS capsule Take 1 capsule (0.4 mg total) by mouth daily. 30 capsule 0   tiZANidine (ZANAFLEX) 2 MG tablet TAKE 1 TABLET BY MOUTH EVERY 6 HOURS AS NEEDED FOR MUSCLE SPASM 30 tablet 0   torsemide (DEMADEX) 100 MG tablet Take 100 mg by mouth 2 (two) times daily.     No current facility-administered medications for this visit.   Facility-Administered Medications Ordered in Other Visits  Medication Dose Route Frequency Provider Last Rate Last Admin   palonosetron (ALOXI) 0.25 MG/5ML injection             ALLERGIES:  No Known Allergies  PHYSICAL EXAM:  Performance status (ECOG): 1 - Symptomatic but completely ambulatory  Vitals:   03/28/22 1032  BP: 115/72  Pulse: 88  Resp: 18  Temp: (!) 97.5 F (36.4 C)  SpO2: 99%   Wt Readings from Last 3 Encounters:  03/28/22 264 lb 15.9 oz (120.2 kg)  03/14/22 261 lb 4.8 oz (118.5 kg)  03/07/22 265 lb 3.2 oz (120.3 kg)   Physical Exam Vitals reviewed.  Constitutional:      Appearance: Normal appearance. He is obese.  Cardiovascular:     Rate and Rhythm: Normal rate and regular rhythm.     Pulses: Normal pulses.     Heart sounds: Normal heart sounds.  Pulmonary:     Effort: Pulmonary effort is normal.     Breath sounds: Normal breath sounds.  Musculoskeletal:     Right lower leg: No edema.     Left lower leg: No edema.  Neurological:     General: No focal deficit present.     Mental Status: He is alert and oriented to person, place, and time.  Psychiatric:        Mood and Affect: Mood normal.        Behavior: Behavior normal.     LABORATORY DATA:  I have reviewed the labs as listed.     Latest Ref Rng & Units 03/28/2022    9:07 AM 03/14/2022    9:54 AM 03/07/2022    8:36 AM  CBC  WBC 4.0 - 10.5 K/uL 5.3   5.9   6.4    Hemoglobin 13.0 - 17.0 g/dL 10.6   11.0   11.2    Hematocrit 39.0 - 52.0 % 32.3   32.3   34.5    Platelets 150 - 400 K/uL 175   163   183        Latest Ref Rng & Units 03/28/2022    9:07 AM 03/14/2022    9:54 AM 03/07/2022    8:36 AM  CMP  Glucose 70 - 99 mg/dL 113   108   121    BUN 8 - 23 mg/dL 29   36   35    Creatinine 0.61 - 1.24 mg/dL 4.12   3.86   4.03    Sodium 135 - 145 mmol/L 136   138   138    Potassium 3.5 - 5.1 mmol/L 3.7   3.4   3.5    Chloride 98 - 111 mmol/L 111   104   102    CO2 22 - 32 mmol/L 21   24  25    Calcium 8.9 - 10.3 mg/dL 8.8   9.0   9.4    Total Protein 6.5 - 8.1 g/dL 6.6   7.1   7.1    Total Bilirubin 0.3 - 1.2 mg/dL 0.6   0.8   0.9    Alkaline Phos 38 - 126 U/L _0 AST 15 - 41 U/L _1 ALT 0 - 44 U/L _2 DIAGNOSTIC IMAGING:  I have independently reviewed the scans and discussed with the patient. No results found.   ASSESSMENT:  1.  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  Basis for CKD is hypertension associated arteriosclerosis with arterionephrosclerosis. - Bone marrow biopsy on 02/21/2021 with slightly hypercellular bone marrow with trilineage hematopoiesis.  Plasma cells increased in number representing 11% of all cells with kappa light chain restriction. - Myeloma FISH panel with no evidence of T p53.  Plasma cell enrichment yielded limited cellularity, therefore only tested for T p53 probes at. - Chromosome analysis 45, X,- Y (8)/46, XY (12) - Labs on 02/01/2021-M spike 2 g, kappa light chain 794, free light chain ratio 49.97.  Beta-2 microglobulin 5.5, albumin 3.5.  LDH  normal. - PET scan on 03/08/2021 with no evidence of active myeloma.  No soft tissue plasmacytoma. - 24-hour urine total protein 2.2 g on 03/13/2021.  Immunofixation positive for IgG kappa. - 4 cycles of CyBorD from 03/14/2021 through 07/04/2021 held due to fluid retention.  Myeloma labs on 08/08/2021 showed progression. - Daratumumab, Velcade and dexamethasone started on 08/30/2021.   2.  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  Smoked 2 packs/day for 32 years and quit smoking cigarettes and vaping now. -Mother had small cell lung cancer.   3.  CKD: -He has stage V CKD thought to be secondary to hypertension. -Renal ultrasound on 12/19/2020 was negative for obstructive uropathy and findings were consistent with chronic medical renal disease.  Bilateral kidney cysts were seen. -He has 2.7 g of proteinuria on 24-hour urine. -Kidney biopsy was done on 01/25/2021.   PLAN:  1.  Stage II, standard risk IgG kappa multiple myeloma: - Reviewed myeloma labs from 03/14/2022.  M spike is stable at 0.2 g.  Free light chain ratio is 4.19 and continues to trend down.  Kappa light chains are 39.4 and trending down. - We will change Velcade (1 mg/m2) to every 2 weeks and Darzalex every 4 weeks.  RTC 7 weeks for follow-up.   2.  Smoking history: - CT chest on 02/15/2021 was lung RADS 1.   3.  ID prophylaxis: - Continue aspirin daily.  We will start him on acyclovir 200 mg twice daily.   4.  Myeloma bone disease: - Last denosumab on 09/13/2021.  Holding denosumab since dialysis started.  5.  Lower extremity swelling: - Continue furosemide twice daily.  Dexamethasone was cut back to every other week.  6.  Peripheral neuropathy: - Tingling and numbness has been stable.  Continue gabapentin.  7.  ESRD on HD: - HD started on 08/22/2021, on Monday Wednesday and Friday. - He is being planned for placement of peritoneal dialysis catheter.   Orders placed this encounter:   No orders of the defined types were placed  in this encounter.    Derek Jack, MD [facility] [phone]   I, Thana Ates, am acting as a scribe for Dr. Derek Jack.  I, Derek Jack MD, have reviewed the above documentation for accuracy and completeness, and I agree with the above.

## 2022-03-28 NOTE — Patient Instructions (Signed)
[facility]  Discharge Instructions: Thank you for choosing [facility] to provide your oncology and hematology care.  If you have a lab appointment with the [facility], please come in thru the Main Entrance and check in at the main information desk.  Wear comfortable clothing and clothing appropriate for easy access to any Portacath or PICC line.   We strive to give you quality time with your provider. You may need to reschedule your appointment if you arrive late (15 or more minutes).  Arriving late affects you and other patients whose appointments are after yours.  Also, if you miss three or more appointments without notifying the office, you may be dismissed from the clinic at the provider's discretion.      For prescription refill requests, have your pharmacy contact our office and allow 72 hours for refills to be completed.    Today you received the following chemotherapy and/or immunotherapy agents Dara SQ and Velcade injection.    To help prevent nausea and vomiting after your treatment, we encourage you to take your nausea medication as directed.  BELOW ARE SYMPTOMS THAT SHOULD BE REPORTED IMMEDIATELY: *FEVER GREATER THAN 100.4 F (38 C) OR HIGHER *CHILLS OR SWEATING *NAUSEA AND VOMITING THAT IS NOT CONTROLLED WITH YOUR NAUSEA MEDICATION *UNUSUAL SHORTNESS OF BREATH *UNUSUAL BRUISING OR BLEEDING *URINARY PROBLEMS (pain or burning when urinating, or frequent urination) *BOWEL PROBLEMS (unusual diarrhea, constipation, pain near the anus) TENDERNESS IN MOUTH AND THROAT WITH OR WITHOUT PRESENCE OF ULCERS (sore throat, sores in mouth, or a toothache) UNUSUAL RASH, SWELLING OR PAIN  UNUSUAL VAGINAL DISCHARGE OR ITCHING   Items with * indicate a potential emergency and should be followed up as soon as possible or go to the Emergency Department if any problems should occur.  Please show the CHEMOTHERAPY ALERT CARD or IMMUNOTHERAPY ALERT CARD at check-in to  the Emergency Department and triage nurse.  Should you have questions after your visit or need to cancel or reschedule your appointment, please contact [facility] [phone]  and follow the prompts.  Office hours are 8:00 a.m. to 4:30 p.m. Monday - Friday. Please note that voicemails left after 4:00 p.m. may not be returned until the following business day.  We are closed weekends and major holidays. You have access to a nurse at all times for urgent questions. Please call the main number to the clinic [phone] and follow the prompts.  For any non-urgent questions, you may also contact your provider using MyChart. We now offer e-Visits for anyone 73 and older to request care online for non-urgent symptoms. For details visit [email or web address].   Also download the MyChart app! Go to the app store, search "MyChart", open the app, select [facility], and log in with your MyChart username and password.  Due to Covid, a mask is required upon entering the hospital/clinic. If you do not have a mask, one will be given to you upon arrival. For doctor visits, patients may have 1 support person aged 20 or older with them. For treatment visits, patients cannot have anyone with them due to current Covid guidelines and our immunocompromised population.   Daratumumab injection What is this medication? DARATUMUMAB (dar a toom ue mab) is a monoclonal antibody. It is used to treat multiple myeloma. This medicine may be used for other purposes; ask your health care provider or pharmacist if you have questions. COMMON BRAND NAME(S): DARZALEX What should I tell my care team before I take  this medication? They need to know if you have any of these conditions: hereditary fructose intolerance infection (especially a virus infection such as chickenpox, herpes, or hepatitis B virus) lung or breathing disease (asthma, COPD) an unusual or allergic reaction to daratumumab, sorbitol, other medicines,  foods, dyes, or preservatives pregnant or trying to get pregnant breast-feeding How should I use this medication? This medicine is for infusion into a vein. It is given by a health care professional in a hospital or clinic setting. Talk to your pediatrician regarding the use of this medicine in children. Special care may be needed. Overdosage: If you think you have taken too much of this medicine contact a poison control center or emergency room at once. NOTE: This medicine is only for you. Do not share this medicine with others. What if I miss a dose? Keep appointments for follow-up doses as directed. It is important not to miss your dose. Call your doctor or health care professional if you are unable to keep an appointment. What may interact with this medication? Interactions have not been studied. This list may not describe all possible interactions. Give your health care provider a list of all the medicines, herbs, non-prescription drugs, or dietary supplements you use. Also tell them if you smoke, drink alcohol, or use illegal drugs. Some items may interact with your medicine. What should I watch for while using this medication? Your condition will be monitored carefully while you are receiving this medicine. This medicine can cause serious allergic reactions. To reduce your risk, your health care provider may give you other medicine to take before receiving this one. Be sure to follow the directions from your health care provider. This medicine can affect the results of blood tests to match your blood type. These changes can last for up to 6 months after the final dose. Your healthcare provider will do blood tests to match your blood type before you start treatment. Tell all of your healthcare providers that you are being treated with this medicine before receiving a blood transfusion. This medicine can affect the results of some tests used to determine treatment response; extra tests may be  needed to evaluate response. Do not become pregnant while taking this medicine or for 3 months after stopping it. Women should inform their health care provider if they wish to become pregnant or think they might be pregnant. There is a potential for serious side effects to an unborn child. Talk to your health care provider for more information. Do not breast-feed an infant while taking this medicine. What side effects may I notice from receiving this medication? Side effects that you should report to your care team as soon as possible: Allergic reactions--skin rash, itching or hives, swelling of the face, lips, or tongue Blurred vision Infection--fever, chills, cough, sore throat, pain or trouble passing urine Infusion reactions--dizziness, fast heartbeat, feeling faint or lightheaded, falls, headache, increase in blood pressure, nausea, vomiting, or wheezing or trouble breathing with loud or whistling sounds Unusual bleeding or bruising Side effects that usually do not require medical attention (report to your care team if they continue or are bothersome): Constipation Diarrhea Pain, tingling, numbness in the hands or feet Swelling of the ankles, feet, hands Tiredness This list may not describe all possible side effects. Call your doctor for medical advice about side effects. You may report side effects to FDA at 1-800-FDA-1088. Where should I keep my medication? This drug is given in a hospital or clinic and will not be  stored at home. NOTE: This sheet is a summary. It may not cover all possible information. If you have questions about this medicine, talk to your doctor, pharmacist, or health care provider.  2023 Elsevier/Gold Standard (2021-03-29 00:00:00)

## 2022-03-28 NOTE — Progress Notes (Signed)
Patient has been examined by Dr. Katragadda, and vital signs and labs have been reviewed. ANC, Creatinine, LFTs, hemoglobin, and platelets are within treatment parameters per M.D. - pt may proceed with treatment.    °

## 2022-03-28 NOTE — Patient Instructions (Signed)
[place] at [facility] Discharge Instructions   You were seen and examined today by Dr. Delton Coombes.  He reviewed your lab work which is normal/stable.   After this cycle, your treatment will change to every other week.   Return as scheduled.     Thank you for choosing [place] at [facility] to provide your oncology and hematology care.  To afford each patient quality time with our provider, please arrive at least 15 minutes before your scheduled appointment time.   If you have a lab appointment with the [facility] please come in thru the Main Entrance and check in at the main information desk.  You need to re-schedule your appointment should you arrive 10 or more minutes late.  We strive to give you quality time with our providers, and arriving late affects you and other patients whose appointments are after yours.  Also, if you no show three or more times for appointments you may be dismissed from the clinic at the providers discretion.     Again, thank you for choosing [facility].  Our hope is that these requests will decrease the amount of time that you wait before being seen by our physicians.       _____________________________________________________________  Should you have questions after your visit to [facility], please contact our office at [phone] and follow the prompts.  Our office hours are 8:00 a.m. and 4:30 p.m. Monday - Friday.  Please note that voicemails left after 4:00 p.m. may not be returned until the following business day.  We are closed weekends and major holidays.  You do have access to a nurse 24-7, just call the main number to the clinic [phone] and do not press any options, hold on the line and a nurse will answer the phone.    For prescription refill requests, have your pharmacy contact our office and allow 72 hours.    Due to Covid, you will need to wear a mask upon  entering the hospital. If you do not have a mask, a mask will be given to you at the Main Entrance upon arrival. For doctor visits, patients may have 1 support person age 52 or older with them. For treatment visits, patients can not have anyone with them due to social distancing guidelines and our immunocompromised population.

## 2022-03-29 ENCOUNTER — Encounter (HOSPITAL_COMMUNITY): Payer: Self-pay | Admitting: Hematology

## 2022-03-29 DIAGNOSIS — Z992 Dependence on renal dialysis: Secondary | ICD-10-CM | POA: Diagnosis not present

## 2022-03-29 DIAGNOSIS — N186 End stage renal disease: Secondary | ICD-10-CM | POA: Diagnosis not present

## 2022-04-03 DIAGNOSIS — N186 End stage renal disease: Secondary | ICD-10-CM | POA: Diagnosis not present

## 2022-04-03 DIAGNOSIS — Z992 Dependence on renal dialysis: Secondary | ICD-10-CM | POA: Diagnosis not present

## 2022-04-04 ENCOUNTER — Inpatient Hospital Stay (HOSPITAL_COMMUNITY): Payer: Medicare HMO

## 2022-04-04 ENCOUNTER — Inpatient Hospital Stay (HOSPITAL_COMMUNITY): Payer: Medicare HMO | Attending: Hematology

## 2022-04-04 ENCOUNTER — Encounter (HOSPITAL_COMMUNITY): Payer: Self-pay

## 2022-04-04 VITALS — BP 109/69 | HR 93 | Temp 97.3°F | Resp 18 | Wt 260.8 lb

## 2022-04-04 DIAGNOSIS — Z5112 Encounter for antineoplastic immunotherapy: Secondary | ICD-10-CM | POA: Insufficient documentation

## 2022-04-04 DIAGNOSIS — C9000 Multiple myeloma not having achieved remission: Secondary | ICD-10-CM

## 2022-04-04 DIAGNOSIS — D472 Monoclonal gammopathy: Secondary | ICD-10-CM

## 2022-04-04 LAB — COMPREHENSIVE METABOLIC PANEL
ALT: 20 U/L (ref 0–44)
AST: 16 U/L (ref 15–41)
Albumin: 4 g/dL (ref 3.5–5.0)
Alkaline Phosphatase: 36 U/L — ABNORMAL LOW (ref 38–126)
Anion gap: 7 (ref 5–15)
BUN: 27 mg/dL — ABNORMAL HIGH (ref 8–23)
CO2: 25 mmol/L (ref 22–32)
Calcium: 9 mg/dL (ref 8.9–10.3)
Chloride: 103 mmol/L (ref 98–111)
Creatinine, Ser: 3.53 mg/dL — ABNORMAL HIGH (ref 0.61–1.24)
GFR, Estimated: 18 mL/min — ABNORMAL LOW (ref >60)
Glucose, Bld: 104 mg/dL — ABNORMAL HIGH (ref 70–99)
Potassium: 3.5 mmol/L (ref 3.5–5.1)
Sodium: 135 mmol/L (ref 135–145)
Total Bilirubin: 0.8 mg/dL (ref 0.3–1.2)
Total Protein: 7.3 g/dL (ref 6.5–8.1)

## 2022-04-04 LAB — CBC WITH DIFFERENTIAL/PLATELET
Basophils Absolute: 0.1 10*3/uL (ref 0.0–0.1)
Basophils Relative: 1 %
Eosinophils Absolute: 0.1 10*3/uL (ref 0.0–0.5)
Eosinophils Relative: 1 %
HCT: 34 % — ABNORMAL LOW (ref 39.0–52.0)
Hemoglobin: 11.4 g/dL — ABNORMAL LOW (ref 13.0–17.0)
Lymphocytes Relative: 44 %
Lymphs Abs: 2.6 10*3/uL (ref 0.7–4.0)
MCH: 32.4 pg (ref 26.0–34.0)
MCHC: 33.5 g/dL (ref 30.0–36.0)
MCV: 96.6 fL (ref 80.0–100.0)
Monocytes Absolute: 0.5 10*3/uL (ref 0.1–1.0)
Monocytes Relative: 9 %
Neutro Abs: 2.6 10*3/uL (ref 1.7–7.7)
Neutrophils Relative %: 45 %
Platelets: 170 10*3/uL (ref 150–400)
RBC: 3.52 MIL/uL — ABNORMAL LOW (ref 4.22–5.81)
RDW: 15.9 % — ABNORMAL HIGH (ref 11.5–15.5)
WBC: 5.8 10*3/uL (ref 4.0–10.5)
nRBC: 0 % (ref 0.0–0.2)

## 2022-04-04 LAB — MAGNESIUM: Magnesium: 2.1 mg/dL (ref 1.7–2.4)

## 2022-04-04 MED ORDER — BORTEZOMIB CHEMO SQ INJECTION 3.5 MG (2.5MG/ML)
1.0000 mg/m2 | Freq: Once | INTRAMUSCULAR | Status: AC
  Administered 2022-04-04: 2.25 mg via SUBCUTANEOUS
  Filled 2022-04-04: qty 0.9

## 2022-04-04 MED ORDER — DEXAMETHASONE 4 MG PO TABS: 20.0000 mg | ORAL_TABLET | Freq: Once | ORAL | Status: DC

## 2022-04-04 NOTE — Patient Instructions (Signed)
[facility]  Discharge Instructions: Thank you for choosing [facility] to provide your oncology and hematology care.  If you have a lab appointment with the Cancer Center, please come in thru the Main Entrance and check in at the main information desk.  Wear comfortable clothing and clothing appropriate for easy access to any Portacath or PICC line.   We strive to give you quality time with your provider. You may need to reschedule your appointment if you arrive late (15 or more minutes).  Arriving late affects you and other patients whose appointments are after yours.  Also, if you miss three or more appointments without notifying the office, you may be dismissed from the clinic at the provider's discretion.      For prescription refill requests, have your pharmacy contact our office and allow 72 hours for refills to be completed.        To help prevent nausea and vomiting after your treatment, we encourage you to take your nausea medication as directed.  BELOW ARE SYMPTOMS THAT SHOULD BE REPORTED IMMEDIATELY: *FEVER GREATER THAN 100.4 F (38 C) OR HIGHER *CHILLS OR SWEATING *NAUSEA AND VOMITING THAT IS NOT CONTROLLED WITH YOUR NAUSEA MEDICATION *UNUSUAL SHORTNESS OF BREATH *UNUSUAL BRUISING OR BLEEDING *URINARY PROBLEMS (pain or burning when urinating, or frequent urination) *BOWEL PROBLEMS (unusual diarrhea, constipation, pain near the anus) TENDERNESS IN MOUTH AND THROAT WITH OR WITHOUT PRESENCE OF ULCERS (sore throat, sores in mouth, or a toothache) UNUSUAL RASH, SWELLING OR PAIN  UNUSUAL VAGINAL DISCHARGE OR ITCHING   Items with * indicate a potential emergency and should be followed up as soon as possible or go to the Emergency Department if any problems should occur.  Please show the CHEMOTHERAPY ALERT CARD or IMMUNOTHERAPY ALERT CARD at check-in to the Emergency Department and triage nurse.  Should you have questions after your visit or need to cancel  or reschedule your appointment, please contact [facility] [phone]  and follow the prompts.  Office hours are 8:00 a.m. to 4:30 p.m. Monday - Friday. Please note that voicemails left after 4:00 p.m. may not be returned until the following business day.  We are closed weekends and major holidays. You have access to a nurse at all times for urgent questions. Please call the main number to the clinic [phone] and follow the prompts.  For any non-urgent questions, you may also contact your provider using MyChart. We now offer e-Visits for anyone 18 and older to request care online for non-urgent symptoms. For details visit [email or web address].   Also download the MyChart app! Go to the app store, search "MyChart", open the app, select [facility], and log in with your MyChart username and password.  Due to Covid, a mask is required upon entering the hospital/clinic. If you do not have a mask, one will be given to you upon arrival. For doctor visits, patients may have 1 support person aged 18 or older with them. For treatment visits, patients cannot have anyone with them due to current Covid guidelines and our immunocompromised population.  

## 2022-04-04 NOTE — Progress Notes (Signed)
Patient tolerated Velcade injection with no complaints voiced. Lab work reviewed. See MAR for details. Injection site clean and dry with no bruising or swelling noted. Patient stable during and after injection. Band aid applied. VSS. Patient left in satisfactory condition with no s/s of distress noted. 

## 2022-04-08 DIAGNOSIS — N186 End stage renal disease: Secondary | ICD-10-CM | POA: Diagnosis not present

## 2022-04-08 DIAGNOSIS — Z992 Dependence on renal dialysis: Secondary | ICD-10-CM | POA: Diagnosis not present

## 2022-04-09 ENCOUNTER — Encounter: Payer: Self-pay | Admitting: Cardiology

## 2022-04-09 ENCOUNTER — Ambulatory Visit (INDEPENDENT_AMBULATORY_CARE_PROVIDER_SITE_OTHER): Payer: Medicare HMO | Admitting: Cardiology

## 2022-04-09 VITALS — BP 98/72 | HR 103 | Ht 67.0 in | Wt 261.4 lb

## 2022-04-09 DIAGNOSIS — R0602 Shortness of breath: Secondary | ICD-10-CM

## 2022-04-09 DIAGNOSIS — R0609 Other forms of dyspnea: Secondary | ICD-10-CM

## 2022-04-09 DIAGNOSIS — R600 Localized edema: Secondary | ICD-10-CM

## 2022-04-09 NOTE — Progress Notes (Signed)
Clinical Summary Ernest Barnett is a 67 y.o.male  SOB/LE edema - 05/2021 renal stopped norvasc, started lasix 07/2021 echo: LVEF 50-55%, apical hypokinesis,  10/2021 echo: LVEF 50-55% Jan 2023 nuclear stress: mild apical and mid to basal inferoseptal ischemia.Low risk  - ongoing SOB. Some wheezing at times. Mixed compliance with inhalers, not taking symbicort daily.  - some LE edema that is mild - Jan 2023 started on ranexa, not improved   2. ESRD - followed by Ernest Barnett  - plans for peritoneal catheter placement   3. Prior CVA   4. COPD - compliant with inhalers.   5. OSA eval - upcopming appt with Ernest Elsworth [place]   6.Multiple myeloma - followed by heme/onc Past Medical History:  Diagnosis Date   Chronic kidney disease    COPD (chronic obstructive pulmonary disease) ([place])    CVA (cerebral vascular accident) ([place]) 2013   Cystoid macular edema of left eye 03/07/2021   GERD (gastroesophageal reflux disease)    HOH (hard of hearing)    Hypercholesteremia    Hypertension    Left epiretinal membrane 03/07/2021   Pseudophakia 03/07/2021   Stroke ([place]) 03/24/2012   left sided weakness   Vitamin D deficiency      No Known Allergies   Current Outpatient Medications  Medication Sig Dispense Refill   acyclovir (ZOVIRAX) 200 MG capsule Take 1 capsule (200 mg total) by mouth 2 (two) times daily. 60 capsule 6   albuterol (VENTOLIN HFA) 108 (90 Base) MCG/ACT inhaler Inhale 2 puffs into the lungs every 6 (six) hours as needed for wheezing or shortness of breath. 8 g 0   aspirin EC 81 MG tablet Take 1 tablet (81 mg total) by mouth daily. 30 tablet 11   bortezomib IV (VELCADE) 3.5 MG injection Inject 1.5 mg/m2 into the vein once a week.     budesonide-formoterol (SYMBICORT) 80-4.5 MCG/ACT inhaler Inhale 2 puffs into the lungs 2 (two) times daily as needed (shortness of breath).     calcitRIOL (ROCALTROL) 0.25 MCG capsule Take 0.25 mcg by mouth daily.     cholecalciferol (VITAMIN D3) 25 MCG  (1000 UNIT) tablet Take 2,000 Units by mouth daily.     dexamethasone (DECADRON) 4 MG tablet Take 2.5 tablets (10 mg total) by mouth once a week. Take at home along with Tylenol and Benadryl weekly on the day of daratumumab injection. 30 tablet 2   docusate sodium (COLACE) 100 MG capsule Take 100 mg by mouth daily.     gabapentin (NEURONTIN) 300 MG capsule Take 1 capsule by mouth twice daily 60 capsule 6   midodrine (PROAMATINE) 10 MG tablet Take by mouth.     Omega-3 Fatty Acids (FISH OIL) 1000 MG CAPS Take 1,000 mg by mouth in the morning, at noon, and at bedtime.      oxyCODONE (OXY IR/ROXICODONE) 5 MG immediate release tablet Take 1 tablet (5 mg total) by mouth 2 (two) times daily as needed for severe pain. 30 tablet 0   pantoprazole (PROTONIX) 40 MG tablet Take 1 tablet (40 mg total) by mouth 2 (two) times daily. 180 tablet 3   polyethylene glycol (MIRALAX / GLYCOLAX) packet Take 17 g by mouth daily. Patient states that he takes as needed. 30 each 5   pravastatin (PRAVACHOL) 80 MG tablet Take 1 tablet (80 mg total) by mouth daily. 90 tablet 3   ranolazine (RANEXA) 500 MG 12 hr tablet Take 1 tablet (500 mg total) by mouth daily. 90 tablet 3  sildenafil (REVATIO) 20 MG tablet Take 1 tablet (20 mg total) by mouth 5 (five) times daily as needed. 30 tablet 2   sodium bicarbonate 325 MG tablet Take 325 mg by mouth 3 (three) times daily.     tamsulosin (FLOMAX) 0.4 MG CAPS capsule Take 1 capsule (0.4 mg total) by mouth daily. 30 capsule 0   tiZANidine (ZANAFLEX) 2 MG tablet TAKE 1 TABLET BY MOUTH EVERY 6 HOURS AS NEEDED FOR MUSCLE SPASM 30 tablet 0   torsemide (DEMADEX) 100 MG tablet Take 100 mg by mouth 2 (two) times daily.     No current facility-administered medications for this visit.   Facility-Administered Medications Ordered in Other Visits  Medication Dose Route Frequency Provider Last Rate Last Admin   palonosetron (ALOXI) 0.25 MG/5ML injection              Past Surgical History:   Procedure Laterality Date   A/V FISTULAGRAM Right 01/29/2022   Procedure: A/V Fistulagram;  Surgeon: Serafina Mitchell, MD;  Location: [facility];  Service: Cardiovascular;  Laterality: Right;   AV FISTULA PLACEMENT Right 02/08/2021   Procedure: RIGHT ARM ARTERIOVENOUS (AV) FISTULA CREATION;  Surgeon: [name], MD;  Location: [facility];  Service: Vascular;  Laterality: Right;   BIOPSY  11/12/2018   Procedure: BIOPSY;  Surgeon: Rogene Houston, MD;  [facility]: [facility];  Service: Endoscopy;;  colon   BIOPSY  05/10/2020   Procedure: BIOPSY;  Surgeon: Rogene Houston, MD;  [facility];  Service: Endoscopy;;  esophagus   CATARACT EXTRACTION     right eye   CATARACT EXTRACTION W/PHACO  10/08/2012   Procedure: CATARACT EXTRACTION PHACO AND INTRAOCULAR LENS PLACEMENT ([place]);  Surgeon: Tonny , MD;  Location: [facility];  Service: Ophthalmology;  Laterality: Left;  CDE:6.64   CHOLECYSTECTOMY     [place]   COLONOSCOPY N/A 11/12/2018   Procedure: COLONOSCOPY;  Surgeon: Rogene Houston, MD;  [facility];  Service: Endoscopy;  Laterality: N/A;  1030   ELBOW FRACTURE SURGERY     left   ESOPHAGEAL DILATION N/A 11/12/2018   Procedure: ESOPHAGEAL DILATION;  Surgeon: Rogene Houston, MD;  [facility];  Service: Endoscopy;  Laterality: N/A;   ESOPHAGEAL DILATION N/A 05/10/2020   Procedure: ESOPHAGEAL DILATION;  Surgeon: Rogene Houston, MD;  [facility];  Service: Endoscopy;  Laterality: N/A;   ESOPHAGOGASTRODUODENOSCOPY N/A 11/12/2018   Procedure: ESOPHAGOGASTRODUODENOSCOPY (EGD);  Surgeon: Rogene Houston, MD;  [facility];  Service: Endoscopy;  Laterality: N/A;   ESOPHAGOGASTRODUODENOSCOPY N/A 05/10/2020   Procedure: ESOPHAGOGASTRODUODENOSCOPY (EGD);  Surgeon: Rogene Houston, MD;  [facility];  Service: Endoscopy;  Laterality: N/A;  210   HERNIA REPAIR     right inguinal   HYDROCELE EXCISION / REPAIR     IR FLUORO GUIDE CV LINE  LEFT  08/17/2021   IR US GUIDE VASC ACCESS LEFT  08/17/2021   LIGATION OF COMPETING BRANCHES OF ARTERIOVENOUS FISTULA Right 09/11/2021   Procedure: LIGATION OF COMPETING BRANCHES OF RIGHT ARM ARTERIOVENOUS FISTULA;  Surgeon: [name], MD;  Location: [facility];  Service: Vascular;  Laterality: Right;   PERIPHERAL VASCULAR BALLOON ANGIOPLASTY Right 01/29/2022   Procedure: PERIPHERAL VASCULAR BALLOON ANGIOPLASTY;  Surgeon: Serafina Mitchell, MD;  Location: [facility];  Service: Cardiovascular;  Laterality: Right;  arm fistula   POLYPECTOMY  11/12/2018   Procedure: POLYPECTOMY;  Surgeon: Rogene Houston, MD;  [facility];  Service: Endoscopy;;  colon    RETINAL DETACHMENT SURGERY Left 2019   SPLENECTOMY, TOTAL     TONSILLECTOMY     VOCAL CORD INJECTION     removal of polyp-2005     No Known Allergies    No family history on file.   Social History Mr. Schinke reports that he has quit smoking. His smoking use included cigarettes. He has a 90.00 pack-year smoking history. He uses smokeless tobacco. Mr. Mitton reports current alcohol use of about 1.0 - 2.0 standard drink per week.   Review of Systems CONSTITUTIONAL: No weight loss, fever, chills, weakness or fatigue.  HEENT: Eyes: No visual loss, blurred vision, double vision or yellow sclerae.No hearing loss, sneezing, congestion, runny nose or sore throat.  SKIN: No rash or itching.  CARDIOVASCULAR: per hpi RESPIRATORY: No shortness of breath, cough or sputum.  GASTROINTESTINAL: No anorexia, nausea, vomiting or diarrhea. No abdominal pain or blood.  GENITOURINARY: No burning on urination, no polyuria NEUROLOGICAL: No headache, dizziness, syncope, paralysis, ataxia, numbness or tingling in the extremities. No change in bowel or bladder control.  MUSCULOSKELETAL: No muscle, back pain, joint pain or stiffness.  LYMPHATICS: No enlarged nodes. No history of splenectomy.  PSYCHIATRIC: No history of depression or anxiety.   ENDOCRINOLOGIC: No reports of sweating, cold or heat intolerance. No polyuria or polydipsia.  [name]   Physical Examination Today's Vitals   04/09/22 0954  BP: 98/72  Pulse: (!) 103  SpO2: 92%  Weight: 261 lb 6.4 oz (118.6 kg)  Height: _0  (1.702 m)   Body mass index is 40.94 kg/m.  Gen: resting comfortably, no acute distress HEENT: no scleral icterus, pupils equal round and reactive, no palptable cervical adenopathy,  CV: RRR, no m/r/g no jvd Resp: Clear to auscultation bilaterally GI: abdomen is soft, non-tender, non-distended, normal bowel sounds, no hepatosplenomegaly MSK: extremities are warm, no edema.  Skin: warm, no rash Neuro:  no focal deficits Psych: appropriate affect   Diagnostic Studies  10/2021 echo 1. LImited study with Definity Apical windows foreshortened. Images are  still difficult Mid/Distal inferior and apical hyokinesis. Overall LVEF is  probably 50 to 55%.   2. Right ventricular systolic function is normal. The right ventricular  size is normal.   Jan 2023 nuclear stress  Findings are consistent with ischemia. The study is low risk.   No ST deviation was noted. The ECG was negative for ischemia.   LV perfusion is abnormal.  Small, mild to moderate intensity, apical and mid to basal inferoseptal defects that are partially reversible and consistent with ischemia.   Left ventricular function is normal. Nuclear stress EF: 60 %.   Overall low risk study with small ischemic territories involving the apex and mid inferoseptal walls, LVEF normal at 60% without regional wall motion abnormalities.   Assessment and Plan   1.SOB/DOE/LE edema - echo suggests apical hypokinesis however LVEF is preserved at 50-55% - nculear stress low risk, suggests mild apical and mid to basal inferoseptal ischemia but mild/small areas. No chest pains, manage medically at this time. No recent chest pains, manage medically at this time. He is on ASA/statin.  - encouraged  increased compliance with symbicort, may be main etiology of DOE.  - ok to proceed with peritoneal catheter placement from cardiac standpoint.     Arnoldo Lenis, M.D

## 2022-04-09 NOTE — Patient Instructions (Addendum)
Medication Instructions:  Stop Ranexa (Ranolazine) Continue all other medications.     Labwork: none  Testing/Procedures: none  Follow-Up: 6 months   Any Other Special Instructions Will Be Listed Below (If Applicable).   If you need a refill on your cardiac medications before your next appointment, please call your pharmacy.

## 2022-04-12 DIAGNOSIS — Z992 Dependence on renal dialysis: Secondary | ICD-10-CM | POA: Diagnosis not present

## 2022-04-12 DIAGNOSIS — N186 End stage renal disease: Secondary | ICD-10-CM | POA: Diagnosis not present

## 2022-04-13 DIAGNOSIS — N186 End stage renal disease: Secondary | ICD-10-CM | POA: Diagnosis not present

## 2022-04-13 DIAGNOSIS — Z992 Dependence on renal dialysis: Secondary | ICD-10-CM | POA: Diagnosis not present

## 2022-04-15 DIAGNOSIS — N186 End stage renal disease: Secondary | ICD-10-CM | POA: Diagnosis not present

## 2022-04-15 DIAGNOSIS — C9000 Multiple myeloma not having achieved remission: Secondary | ICD-10-CM | POA: Diagnosis not present

## 2022-04-15 DIAGNOSIS — Z4902 Encounter for fitting and adjustment of peritoneal dialysis catheter: Secondary | ICD-10-CM | POA: Diagnosis not present

## 2022-04-15 DIAGNOSIS — I120 Hypertensive chronic kidney disease with stage 5 chronic kidney disease or end stage renal disease: Secondary | ICD-10-CM | POA: Diagnosis not present

## 2022-04-15 DIAGNOSIS — Z992 Dependence on renal dialysis: Secondary | ICD-10-CM | POA: Diagnosis not present

## 2022-04-15 DIAGNOSIS — E669 Obesity, unspecified: Secondary | ICD-10-CM | POA: Diagnosis not present

## 2022-04-15 DIAGNOSIS — I129 Hypertensive chronic kidney disease with stage 1 through stage 4 chronic kidney disease, or unspecified chronic kidney disease: Secondary | ICD-10-CM | POA: Diagnosis not present

## 2022-04-15 DIAGNOSIS — Z8673 Personal history of transient ischemic attack (TIA), and cerebral infarction without residual deficits: Secondary | ICD-10-CM | POA: Diagnosis not present

## 2022-04-15 DIAGNOSIS — Z6841 Body Mass Index (BMI) 40.0 and over, adult: Secondary | ICD-10-CM | POA: Diagnosis not present

## 2022-04-15 DIAGNOSIS — Z7982 Long term (current) use of aspirin: Secondary | ICD-10-CM | POA: Diagnosis not present

## 2022-04-15 DIAGNOSIS — N189 Chronic kidney disease, unspecified: Secondary | ICD-10-CM | POA: Diagnosis not present

## 2022-04-17 ENCOUNTER — Ambulatory Visit: Payer: Medicare HMO | Admitting: Cardiology

## 2022-04-17 DIAGNOSIS — Z992 Dependence on renal dialysis: Secondary | ICD-10-CM | POA: Diagnosis not present

## 2022-04-17 DIAGNOSIS — N186 End stage renal disease: Secondary | ICD-10-CM | POA: Diagnosis not present

## 2022-04-18 ENCOUNTER — Inpatient Hospital Stay (HOSPITAL_COMMUNITY): Payer: Medicare HMO

## 2022-04-18 ENCOUNTER — Encounter (HOSPITAL_COMMUNITY): Payer: Self-pay

## 2022-04-18 VITALS — BP 120/73 | HR 94 | Temp 98.7°F | Resp 18 | Ht 67.0 in | Wt 251.0 lb

## 2022-04-18 DIAGNOSIS — C9000 Multiple myeloma not having achieved remission: Secondary | ICD-10-CM | POA: Diagnosis not present

## 2022-04-18 DIAGNOSIS — D472 Monoclonal gammopathy: Secondary | ICD-10-CM

## 2022-04-18 DIAGNOSIS — Z5112 Encounter for antineoplastic immunotherapy: Secondary | ICD-10-CM | POA: Diagnosis not present

## 2022-04-18 LAB — COMPREHENSIVE METABOLIC PANEL
ALT: 18 U/L (ref 0–44)
AST: 20 U/L (ref 15–41)
Albumin: 4 g/dL (ref 3.5–5.0)
Alkaline Phosphatase: 40 U/L (ref 38–126)
Anion gap: 8 (ref 5–15)
BUN: 20 mg/dL (ref 8–23)
CO2: 25 mmol/L (ref 22–32)
Calcium: 9.4 mg/dL (ref 8.9–10.3)
Chloride: 105 mmol/L (ref 98–111)
Creatinine, Ser: 3.19 mg/dL — ABNORMAL HIGH (ref 0.61–1.24)
GFR, Estimated: 21 mL/min — ABNORMAL LOW (ref >60)
Glucose, Bld: 91 mg/dL (ref 70–99)
Potassium: 3.4 mmol/L — ABNORMAL LOW (ref 3.5–5.1)
Sodium: 138 mmol/L (ref 135–145)
Total Bilirubin: 0.8 mg/dL (ref 0.3–1.2)
Total Protein: 7.3 g/dL (ref 6.5–8.1)

## 2022-04-18 LAB — CBC WITH DIFFERENTIAL/PLATELET
Abs Immature Granulocytes: 0 10*3/uL (ref 0.00–0.07)
Band Neutrophils: 1 %
Basophils Absolute: 0 10*3/uL (ref 0.0–0.1)
Basophils Relative: 0 %
Eosinophils Absolute: 0 10*3/uL (ref 0.0–0.5)
Eosinophils Relative: 0 %
HCT: 36.1 % — ABNORMAL LOW (ref 39.0–52.0)
Hemoglobin: 11.8 g/dL — ABNORMAL LOW (ref 13.0–17.0)
Lymphocytes Relative: 30 %
Lymphs Abs: 2.1 10*3/uL (ref 0.7–4.0)
MCH: 32 pg (ref 26.0–34.0)
MCHC: 32.7 g/dL (ref 30.0–36.0)
MCV: 97.8 fL (ref 80.0–100.0)
Monocytes Absolute: 0.6 10*3/uL (ref 0.1–1.0)
Monocytes Relative: 8 %
Neutro Abs: 4.3 10*3/uL (ref 1.7–7.7)
Neutrophils Relative %: 61 %
Platelets: 177 10*3/uL (ref 150–400)
RBC: 3.69 MIL/uL — ABNORMAL LOW (ref 4.22–5.81)
RDW: 16.5 % — ABNORMAL HIGH (ref 11.5–15.5)
WBC: 6.8 10*3/uL (ref 4.0–10.5)

## 2022-04-18 LAB — MAGNESIUM: Magnesium: 2.2 mg/dL (ref 1.7–2.4)

## 2022-04-18 MED ORDER — BORTEZOMIB CHEMO SQ INJECTION 3.5 MG (2.5MG/ML)
1.3000 mg/m2 | Freq: Once | INTRAMUSCULAR | Status: DC
  Filled 2022-04-18: qty 1.2

## 2022-04-18 MED ORDER — BORTEZOMIB CHEMO SQ INJECTION 3.5 MG (2.5MG/ML)
1.0000 mg/m2 | Freq: Once | INTRAMUSCULAR | Status: AC
  Administered 2022-04-18: 2.25 mg via SUBCUTANEOUS
  Filled 2022-04-18: qty 0.9

## 2022-04-18 MED ORDER — DARATUMUMAB-HYALURONIDASE-FIHJ 1800-30000 MG-UT/15ML ~~LOC~~ SOLN
1800.0000 mg | Freq: Once | SUBCUTANEOUS | Status: AC
  Administered 2022-04-18: 1800 mg via SUBCUTANEOUS
  Filled 2022-04-18: qty 15

## 2022-04-18 NOTE — Progress Notes (Signed)
Orders received to change Velcade to 1 mg/m2  T.O. Dr Alphonzo Severance Ronnald Ramp, Pharm D

## 2022-04-18 NOTE — Patient Instructions (Signed)
[facility]  Discharge Instructions: Thank you for choosing [facility] to provide your oncology and hematology care.  If you have a lab appointment with the [facility], please come in thru the Main Entrance and check in at the main information desk.  Wear comfortable clothing and clothing appropriate for easy access to any Portacath or PICC line.   We strive to give you quality time with your provider. You may need to reschedule your appointment if you arrive late (15 or more minutes).  Arriving late affects you and other patients whose appointments are after yours.  Also, if you miss three or more appointments without notifying the office, you may be dismissed from the clinic at the provider's discretion.      For prescription refill requests, have your pharmacy contact our office and allow 72 hours for refills to be completed.    Today you received the following chemotherapy and/or immunotherapy agents Velcade and Daratumumab, return as scheduled. Bortezomib injection What is this medication? BORTEZOMIB (bor TEZ oh mib) targets proteins in cancer cells and stops the cancer cells from growing. It treats multiple myeloma and mantle cell lymphoma. This medicine may be used for other purposes; ask your health care provider or pharmacist if you have questions. COMMON BRAND NAME(S): Velcade What should I tell my care team before I take this medication? They need to know if you have any of these conditions: dehydration diabetes (high blood sugar) heart disease liver disease tingling of the fingers or toes or other nerve disorder an unusual or allergic reaction to bortezomib, mannitol, boron, other medicines, foods, dyes, or preservatives pregnant or trying to get pregnant breast-feeding How should I use this medication? This medicine is injected into a vein or under the skin. It is given by a health care provider in a hospital or clinic setting. Talk to your  health care provider about the use of this medicine in children. Special care may be needed. Overdosage: If you think you have taken too much of this medicine contact a poison control center or emergency room at once. NOTE: This medicine is only for you. Do not share this medicine with others. What if I miss a dose? Keep appointments for follow-up doses. It is important not to miss your dose. Call your health care provider if you are unable to keep an appointment. What may interact with this medication? This medicine may interact with the following medications: ketoconazole rifampin This list may not describe all possible interactions. Give your health care provider a list of all the medicines, herbs, non-prescription drugs, or dietary supplements you use. Also tell them if you smoke, drink alcohol, or use illegal drugs. Some items may interact with your medicine. What should I watch for while using this medication? Your condition will be monitored carefully while you are receiving this medicine. You may need blood work done while you are taking this medicine. You may get drowsy or dizzy. Do not drive, use machinery, or do anything that needs mental alertness until you know how this medicine affects you. Do not stand up or sit up quickly, especially if you are an older patient. This reduces the risk of dizzy or fainting spells This medicine may increase your risk of getting an infection. Call your health care provider for advice if you get a fever, chills, sore throat, or other symptoms of a cold or flu. Do not treat yourself. Try to avoid being around people who are sick. Check with  your health care provider if you have severe diarrhea, nausea, and vomiting, or if you sweat a lot. The loss of too much body fluid may make it dangerous for you to take this medicine. Do not become pregnant while taking this medicine or for 7 months after stopping it. Women should inform their health care provider if  they wish to become pregnant or think they might be pregnant. Men should not father a child while taking this medicine and for 4 months after stopping it. There is a potential for serious harm to an unborn child. Talk to your health care provider for more information. Do not breast-feed an infant while taking this medicine or for 2 months after stopping it. This medicine may make it more difficult to get pregnant or father a child. Talk to your health care provider if you are concerned about your fertility. What side effects may I notice from receiving this medication? Side effects that you should report to your doctor or health care professional as soon as possible: allergic reactions (skin rash; itching or hives; swelling of the face, lips, or tongue) bleeding (bloody or black, tarry stools; red or dark brown urine; spitting up blood or brown material that looks like coffee grounds; red spots on the skin; unusual bruising or bleeding from the eye, gums, or nose) blurred vision or changes in vision confusion constipation headache heart failure (trouble breathing; fast, irregular heartbeat; sudden weight gain; swelling of the ankles, feet, hands) infection (fever, chills, cough, sore throat, pain or trouble passing urine) lack or loss of appetite liver injury (dark yellow or brown urine; general ill feeling or flu-like symptoms; loss of appetite, right upper belly pain; yellowing of the eyes or skin) low blood pressure (dizziness; feeling faint or lightheaded, falls; unusually weak or tired) muscle cramps pain, redness, or irritation at site where injected pain, tingling, numbness in the hands or feet seizures trouble breathing unusual bruising or bleeding Side effects that usually do not require medical attention (report to your doctor or health care professional if they continue or are bothersome): diarrhea nausea stomach pain trouble sleeping vomiting This list may not describe all  possible side effects. Call your doctor for medical advice about side effects. You may report side effects to FDA at 1-800-FDA-1088. Where should I keep my medication? This medicine is given in a hospital or clinic. It will not be stored at home. NOTE: This sheet is a summary. It may not cover all possible information. If you have questions about this medicine, talk to your doctor, pharmacist, or health care provider.  2023 Elsevier/Gold Standard (2020-10-12 00:00:00) Daratumumab; Hyaluronidase Injection What is this medication? DARATUMUMAB; HYALURONIDASE (dar a toom ue mab / hye al ur ON i dase) is a monoclonal antibody. Hyaluronidase is used to improve the effects of daratumumab. It treats certain types of cancer. Some of the cancers treated are multiple myeloma and light-chain amyloidosis. This medicine may be used for other purposes; ask your health care provider or pharmacist if you have questions. COMMON BRAND NAME(S): DARZALEX FASPRO What should I tell my care team before I take this medication? They need to know if you have any of these conditions: heart disease infection especially a viral infection such as chickenpox, cold sores, herpes, or hepatitis B lung or breathing disease an unusual or allergic reaction to daratumumab, hyaluronidase, other medicines, foods, dyes, or preservatives pregnant or trying to get pregnant breast-feeding How should I use this medication? This medicine is for injection under the  skin. It is given by a health care professional in a hospital or clinic setting. Talk to your pediatrician regarding the use of this medicine in children. Special care may be needed. Overdosage: If you think you have taken too much of this medicine contact a poison control center or emergency room at once. NOTE: This medicine is only for you. Do not share this medicine with others. What if I miss a dose? Keep appointments for follow-up doses as directed. It is important not to  miss your dose. Call your doctor or health care professional if you are unable to keep an appointment. What may interact with this medication? Interactions have not been studied. This list may not describe all possible interactions. Give your health care provider a list of all the medicines, herbs, non-prescription drugs, or dietary supplements you use. Also tell them if you smoke, drink alcohol, or use illegal drugs. Some items may interact with your medicine. What should I watch for while using this medication? Your condition will be monitored carefully while you are receiving this medicine. This medicine can cause serious allergic reactions. To reduce your risk, your health care provider may give you other medicine to take before receiving this one. Be sure to follow the directions from your health care provider. This medicine can affect the results of blood tests to match your blood type. These changes can last for up to 6 months after the final dose. Your healthcare provider will do blood tests to match your blood type before you start treatment. Tell all of your healthcare providers that you are being treated with this medicine before receiving a blood transfusion. This medicine can affect the results of some tests used to determine treatment response; extra tests may be needed to evaluate response. Do not become pregnant while taking this medicine or for 3 months after stopping it. Women should inform their health care provider if they wish to become pregnant or think they might be pregnant. There is a potential for serious side effects to an unborn child. Talk to your health care provider for more information. Do not breast-feed an infant while taking this medicine. What side effects may I notice from receiving this medication? Side effects that you should report to your care team as soon as possible: Allergic reactions--skin rash, itching or hives, swelling of the face, lips, or tongue Blood  clot--chest pain, shortness of breath, pain, swelling or warmth in the leg Blurred vision Fast, irregular heartbeat Infection--fever, chills, cough, sore throat, pain or trouble passing urine Injection reactions--dizziness, fast heartbeat, feeling faint or lightheaded, falls, headache, increase in blood pressure, nausea, vomiting, or wheezing or trouble breathing with loud or whistling sounds Low red blood cell counts--trouble breathing, feeling faint, lightheaded or falls, unusually weak or tired Unusual bleeding or bruising Side effects that usually do not require medical attention (report these to your care team if they continue or are bothersome): Back pain Constipation Diarrhea Pain, tingling, numbness in the hands or feet Pain, redness, or irritation at site where injected Muscle cramp or pain Swelling of the ankles, feet, hands Tiredness Trouble sleeping This list may not describe all possible side effects. Call your doctor for medical advice about side effects. You may report side effects to FDA at 1-800-FDA-1088. Where should I keep my medication? This drug is given in a hospital or clinic and will not be stored at home. NOTE: This sheet is a summary. It may not cover all possible information. If you have questions about  this medicine, talk to your doctor, pharmacist, or health care provider.  2023 Elsevier/Gold Standard (2021-09-21 00:00:00)  To help prevent nausea and vomiting after your treatment, we encourage you to take your nausea medication as directed.  BELOW ARE SYMPTOMS THAT SHOULD BE REPORTED IMMEDIATELY: *FEVER GREATER THAN 100.4 F (38 C) OR HIGHER *CHILLS OR SWEATING *NAUSEA AND VOMITING THAT IS NOT CONTROLLED WITH YOUR NAUSEA MEDICATION *UNUSUAL SHORTNESS OF BREATH *UNUSUAL BRUISING OR BLEEDING *URINARY PROBLEMS (pain or burning when urinating, or frequent urination) *BOWEL PROBLEMS (unusual diarrhea, constipation, pain near the anus) TENDERNESS IN MOUTH AND  THROAT WITH OR WITHOUT PRESENCE OF ULCERS (sore throat, sores in mouth, or a toothache) UNUSUAL RASH, SWELLING OR PAIN  UNUSUAL VAGINAL DISCHARGE OR ITCHING   Items with * indicate a potential emergency and should be followed up as soon as possible or go to the Emergency Department if any problems should occur.  Please show the CHEMOTHERAPY ALERT CARD or IMMUNOTHERAPY ALERT CARD at check-in to the Emergency Department and triage nurse.  Should you have questions after your visit or need to cancel or reschedule your appointment, please contact [facility] [phone]  and follow the prompts.  Office hours are 8:00 a.m. to 4:30 p.m. Monday - Friday. Please note that voicemails left after 4:00 p.m. may not be returned until the following business day.  We are closed weekends and major holidays. You have access to a nurse at all times for urgent questions. Please call the main number to the clinic [phone] and follow the prompts.  For any non-urgent questions, you may also contact your provider using MyChart. We now offer e-Visits for anyone 45 and older to request care online for non-urgent symptoms. For details visit [email or web address].   Also download the MyChart app! Go to the app store, search "MyChart", open the app, select [facility], and log in with your MyChart username and password.  Masks are optional in the cancer centers. If you would like for your care team to wear a mask while they are taking care of you, please let them know. For doctor visits, patients may have with them one support person who is at least 65 years old. At this time, visitors are not allowed in the infusion area.

## 2022-04-18 NOTE — Progress Notes (Signed)
Patient presents today for Dara and Velcade. Patient reports taking pre-medications at home, pharmacy aware. Patient tolerated Velcade injection with no complaints voiced. Lab work reviewed. See MAR for details. Injection site clean and dry with no bruising or swelling noted. Patient stable during and after injection. Band aid applied.  Patient tolerated Daratumumab injection with no complaints voiced. See MAR for details. Lab reviewed. Injection site clean and dry with no bruising or swelling noted at site. Band aid applied. Vss with discharge and left in satisfactory condition with nos/s of distress noted.

## 2022-04-19 DIAGNOSIS — N186 End stage renal disease: Secondary | ICD-10-CM | POA: Diagnosis not present

## 2022-04-19 DIAGNOSIS — Z992 Dependence on renal dialysis: Secondary | ICD-10-CM | POA: Diagnosis not present

## 2022-04-22 DIAGNOSIS — N186 End stage renal disease: Secondary | ICD-10-CM | POA: Diagnosis not present

## 2022-04-22 DIAGNOSIS — Z992 Dependence on renal dialysis: Secondary | ICD-10-CM | POA: Diagnosis not present

## 2022-04-23 ENCOUNTER — Ambulatory Visit: Payer: Medicare HMO | Admitting: Internal Medicine

## 2022-04-24 DIAGNOSIS — H33022 Retinal detachment with multiple breaks, left eye: Secondary | ICD-10-CM | POA: Diagnosis not present

## 2022-04-24 DIAGNOSIS — Z0100 Encounter for examination of eyes and vision without abnormal findings: Secondary | ICD-10-CM | POA: Diagnosis not present

## 2022-04-24 DIAGNOSIS — Z961 Presence of intraocular lens: Secondary | ICD-10-CM | POA: Diagnosis not present

## 2022-04-24 DIAGNOSIS — H31092 Other chorioretinal scars, left eye: Secondary | ICD-10-CM | POA: Diagnosis not present

## 2022-04-26 DIAGNOSIS — Z992 Dependence on renal dialysis: Secondary | ICD-10-CM | POA: Diagnosis not present

## 2022-04-26 DIAGNOSIS — N186 End stage renal disease: Secondary | ICD-10-CM | POA: Diagnosis not present

## 2022-04-29 ENCOUNTER — Ambulatory Visit: Payer: Medicare HMO | Admitting: Internal Medicine

## 2022-04-29 DIAGNOSIS — N186 End stage renal disease: Secondary | ICD-10-CM | POA: Diagnosis not present

## 2022-04-29 DIAGNOSIS — Z992 Dependence on renal dialysis: Secondary | ICD-10-CM | POA: Diagnosis not present

## 2022-04-29 IMAGING — CT CT BIOPSY CORE RENAL
1 of 3 series · 14 of 32 positions shown, 19 images · non-contrast
Comparison: none

INDICATION: 65-year-old male with a history of proteinuria

[Series 2: i-spiral 5.0 b40f · axial · 0.88mm/px · z∈[+1188,+1367]mm · 14 of 57 slices shown, 19 images]
[im 3/57  soft-tissue]
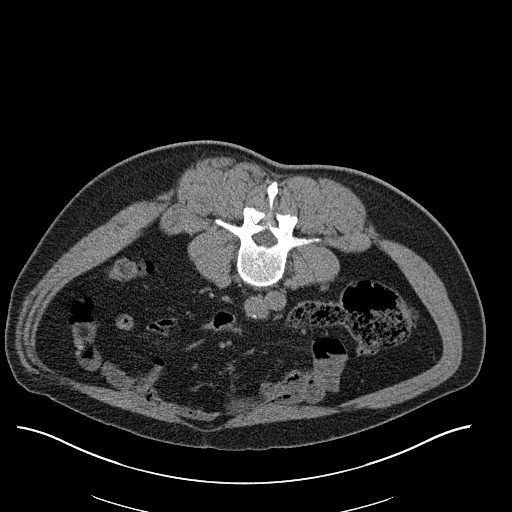
[im 3/57  bone]
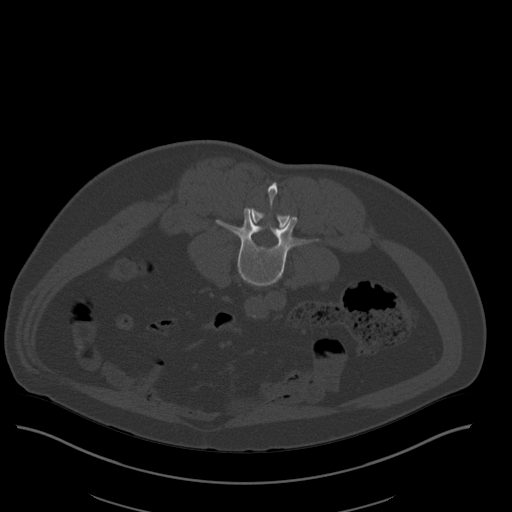
[im 9/57  soft-tissue]
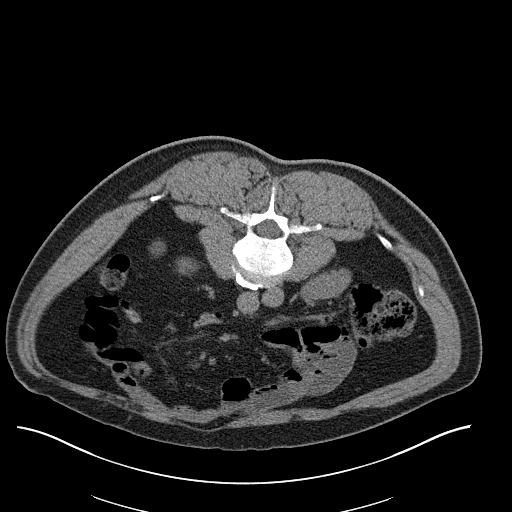
[im 11/57  soft-tissue]
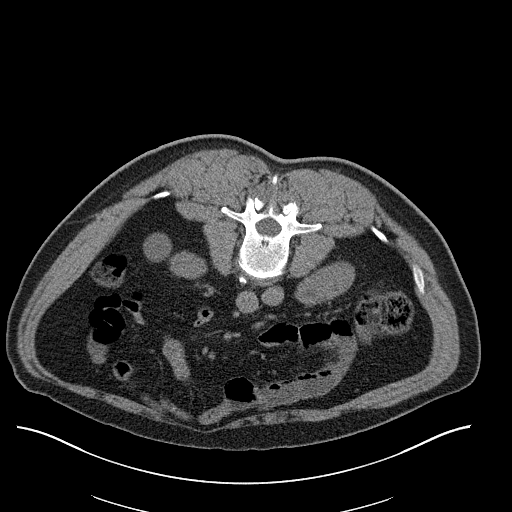
[im 17/57  soft-tissue]
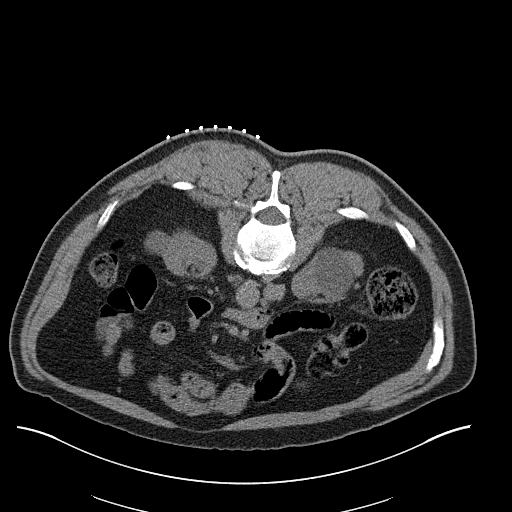
[im 19/57  soft-tissue]
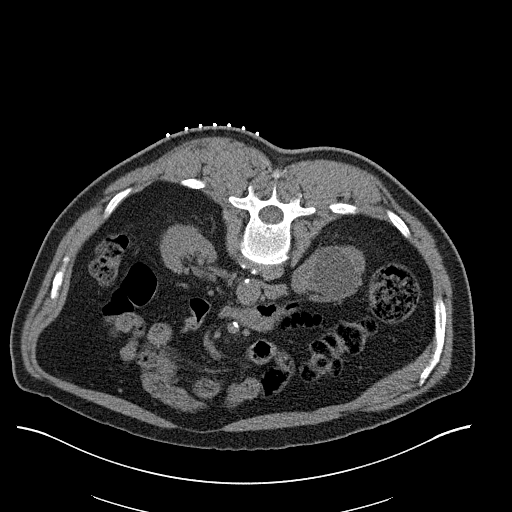
[im 25/57  soft-tissue]
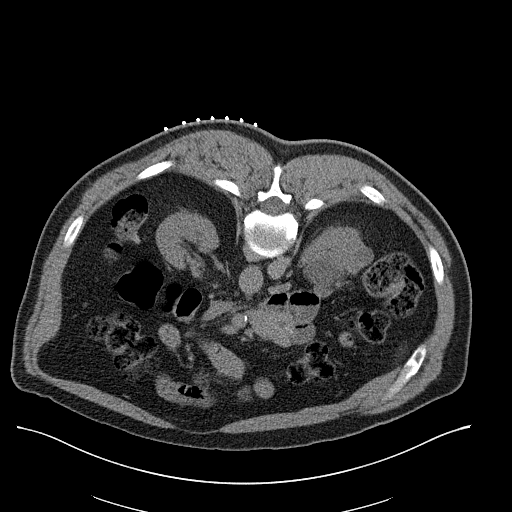
[im 30/57  soft-tissue]
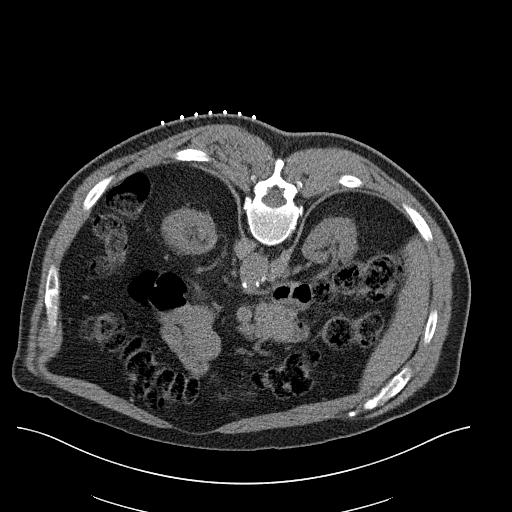
[im 33/57  soft-tissue]
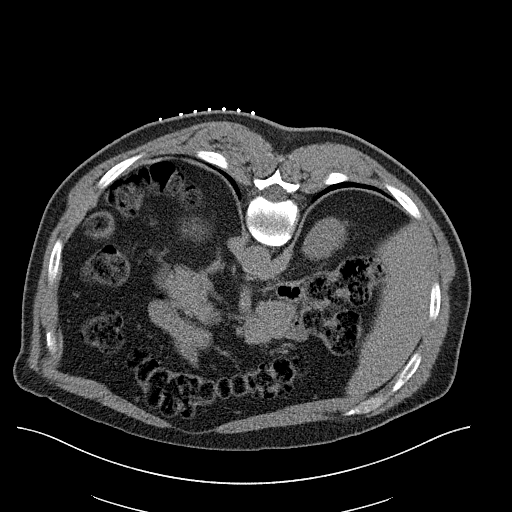
[im 38/57  soft-tissue]
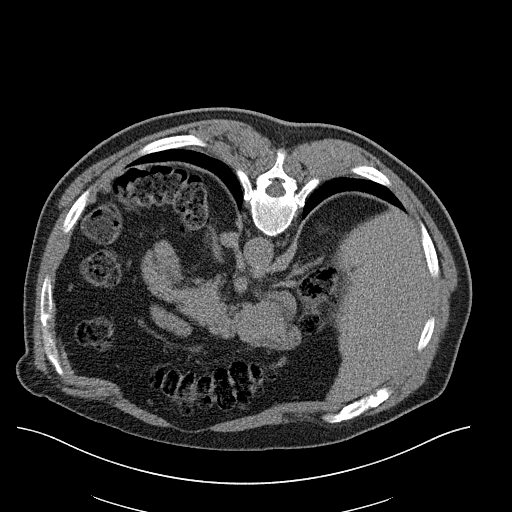
[im 38/57  bone]
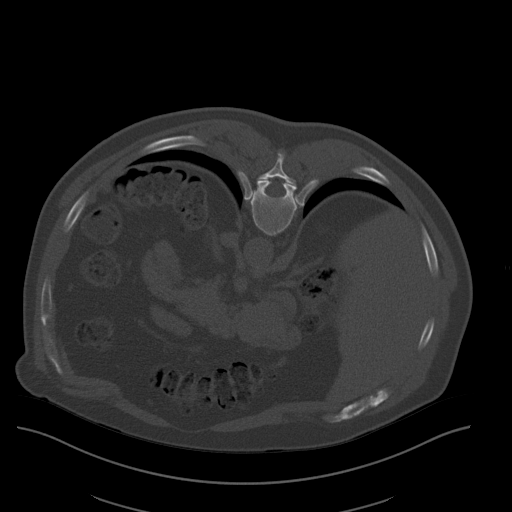
[im 41/57  soft-tissue]
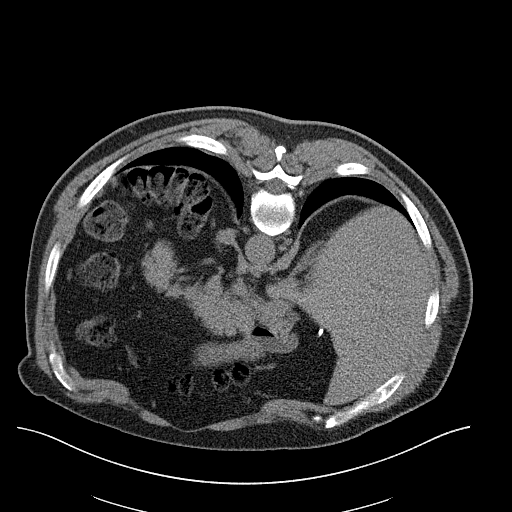
[im 46/57  soft-tissue]
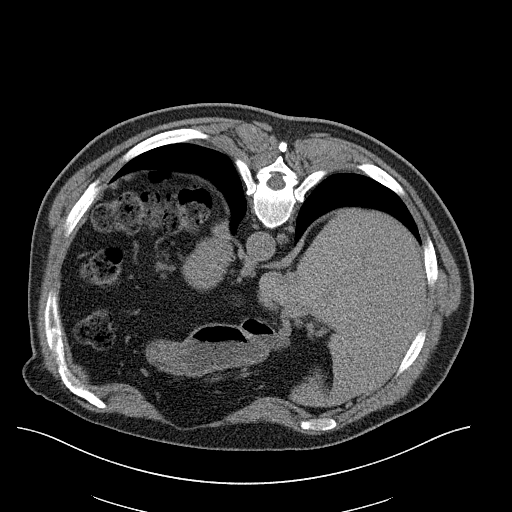
[im 46/57  lung]
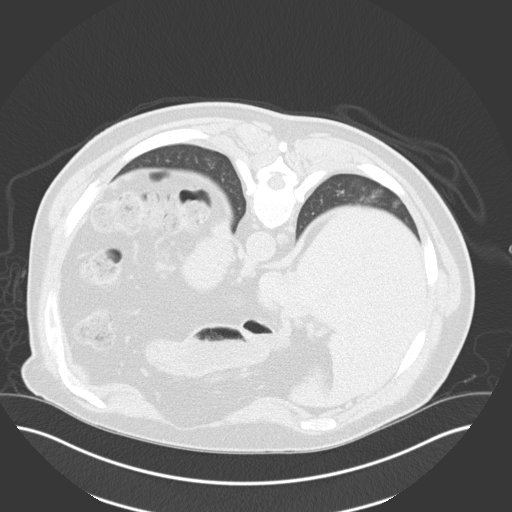
[im 49/57  soft-tissue]
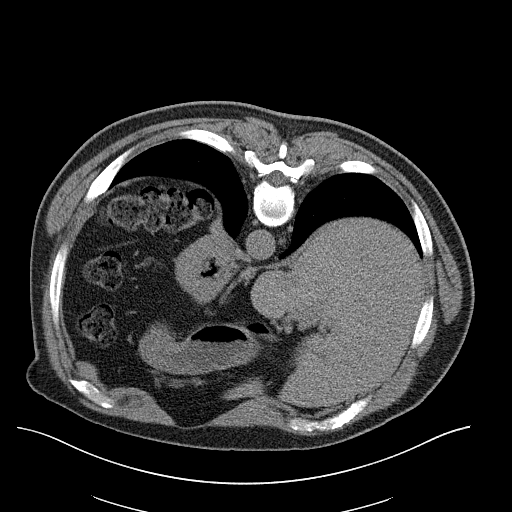
[im 49/57  lung]
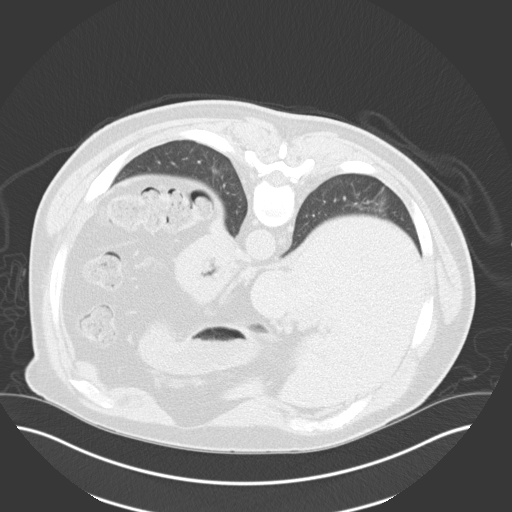
[im 51/57  lung]
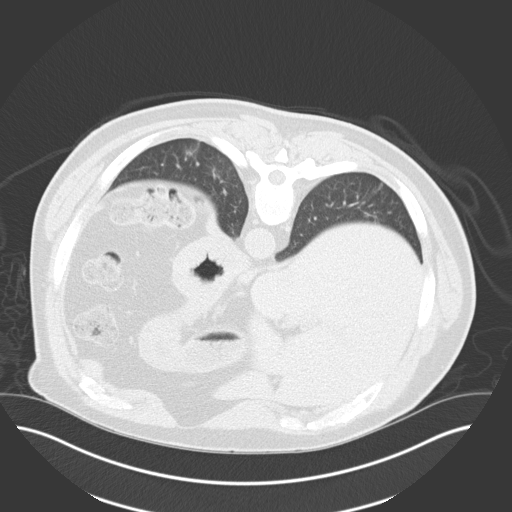
[im 54/57  soft-tissue]
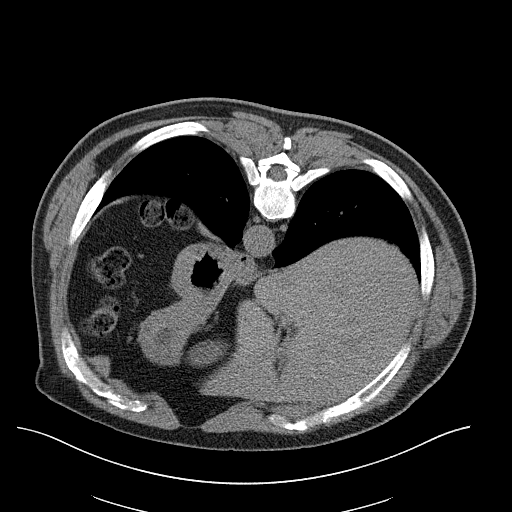
[im 54/57  lung]
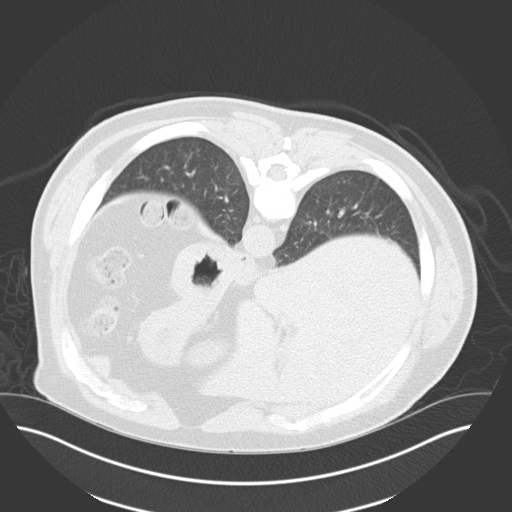

[14 of 32 positions shown; findings below may reference images not displayed]

EXAM:
IMAGE GUIDED BIOPSY OF THE KIDNEY, MEDICAL RENAL

MEDICATIONS:
None.

ANESTHESIA/SEDATION:
Moderate (conscious) sedation was employed during this procedure. A
total of Versed 1.0 mg and Fentanyl 50 mcg was administered
intravenously.

Moderate Sedation Time: 10 minutes. The patient's level of
consciousness and vital signs were monitored continuously by
radiology nursing throughout the procedure under my direct
supervision.

FLUOROSCOPY TIME:  CT

COMPLICATIONS:
None

PROCEDURE:
Informed written consent was obtained from the patient after a
thorough discussion of the procedural risks, benefits and
alternatives. All questions were addressed. Maximal Sterile Barrier
Technique was utilized including caps, mask, sterile gowns, sterile
gloves, sterile drape, hand hygiene and skin antiseptic. A timeout
was performed prior to the initiation of the procedure.

Patient is position prone on the CT gantry table. Scout CT images
were acquired for planning purposes.

Using CT guidance we then advanced a 17 gauge guide needle into the
lateral inferior cortex of the left kidney. Once we confirmed needle
tip position, multiple 18 gauge core biopsy were achieved. Samples
placed into fresh specimen.

Needle was withdrawn and a final image was stored.

Patient tolerated the procedure well and remained hemodynamically
stable throughout.

No complications were encountered and no significant blood loss.
IMPRESSION: Status post CT-guided biopsy of left kidney for medical renal
purpose.

## 2022-04-30 ENCOUNTER — Institutional Professional Consult (permissible substitution): Payer: Medicare HMO | Admitting: Pulmonary Disease

## 2022-04-30 DIAGNOSIS — N186 End stage renal disease: Secondary | ICD-10-CM | POA: Diagnosis not present

## 2022-04-30 DIAGNOSIS — Z992 Dependence on renal dialysis: Secondary | ICD-10-CM | POA: Diagnosis not present

## 2022-05-01 DIAGNOSIS — Z992 Dependence on renal dialysis: Secondary | ICD-10-CM | POA: Diagnosis not present

## 2022-05-01 DIAGNOSIS — N186 End stage renal disease: Secondary | ICD-10-CM | POA: Diagnosis not present

## 2022-05-02 ENCOUNTER — Inpatient Hospital Stay (HOSPITAL_COMMUNITY): Payer: Medicare HMO

## 2022-05-02 VITALS — BP 116/79 | HR 93 | Temp 97.5°F | Resp 18 | Wt 257.7 lb

## 2022-05-02 DIAGNOSIS — C9000 Multiple myeloma not having achieved remission: Secondary | ICD-10-CM

## 2022-05-02 DIAGNOSIS — D472 Monoclonal gammopathy: Secondary | ICD-10-CM

## 2022-05-02 DIAGNOSIS — Z5112 Encounter for antineoplastic immunotherapy: Secondary | ICD-10-CM | POA: Diagnosis not present

## 2022-05-02 LAB — CBC WITH DIFFERENTIAL/PLATELET
Abs Immature Granulocytes: 0.01 10*3/uL (ref 0.00–0.07)
Basophils Absolute: 0 10*3/uL (ref 0.0–0.1)
Basophils Relative: 0 %
Eosinophils Absolute: 0.2 10*3/uL (ref 0.0–0.5)
Eosinophils Relative: 3 %
HCT: 33.7 % — ABNORMAL LOW (ref 39.0–52.0)
Hemoglobin: 11.1 g/dL — ABNORMAL LOW (ref 13.0–17.0)
Immature Granulocytes: 0 %
Lymphocytes Relative: 43 %
Lymphs Abs: 2 10*3/uL (ref 0.7–4.0)
MCH: 32.4 pg (ref 26.0–34.0)
MCHC: 32.9 g/dL (ref 30.0–36.0)
MCV: 98.3 fL (ref 80.0–100.0)
Monocytes Absolute: 0.3 10*3/uL (ref 0.1–1.0)
Monocytes Relative: 7 %
Neutro Abs: 2.2 10*3/uL (ref 1.7–7.7)
Neutrophils Relative %: 47 %
Platelets: 139 10*3/uL — ABNORMAL LOW (ref 150–400)
RBC: 3.43 MIL/uL — ABNORMAL LOW (ref 4.22–5.81)
RDW: 15.9 % — ABNORMAL HIGH (ref 11.5–15.5)
WBC: 4.7 10*3/uL (ref 4.0–10.5)
nRBC: 0 % (ref 0.0–0.2)

## 2022-05-02 LAB — MAGNESIUM: Magnesium: 2.2 mg/dL (ref 1.7–2.4)

## 2022-05-02 LAB — COMPREHENSIVE METABOLIC PANEL
ALT: 18 U/L (ref 0–44)
AST: 13 U/L — ABNORMAL LOW (ref 15–41)
Albumin: 3.6 g/dL (ref 3.5–5.0)
Alkaline Phosphatase: 44 U/L (ref 38–126)
Anion gap: 8 (ref 5–15)
BUN: 26 mg/dL — ABNORMAL HIGH (ref 8–23)
CO2: 21 mmol/L — ABNORMAL LOW (ref 22–32)
Calcium: 9 mg/dL (ref 8.9–10.3)
Chloride: 110 mmol/L (ref 98–111)
Creatinine, Ser: 3.43 mg/dL — ABNORMAL HIGH (ref 0.61–1.24)
GFR, Estimated: 19 mL/min — ABNORMAL LOW (ref >60)
Glucose, Bld: 103 mg/dL — ABNORMAL HIGH (ref 70–99)
Potassium: 3.9 mmol/L (ref 3.5–5.1)
Sodium: 139 mmol/L (ref 135–145)
Total Bilirubin: 0.4 mg/dL (ref 0.3–1.2)
Total Protein: 6.4 g/dL — ABNORMAL LOW (ref 6.5–8.1)

## 2022-05-02 LAB — LACTATE DEHYDROGENASE: LDH: 187 U/L (ref 98–192)

## 2022-05-02 MED ORDER — BORTEZOMIB CHEMO SQ INJECTION 3.5 MG (2.5MG/ML)
1.0000 mg/m2 | Freq: Once | INTRAMUSCULAR | Status: AC
  Administered 2022-05-02: 2.25 mg via SUBCUTANEOUS
  Filled 2022-05-02: qty 0.9

## 2022-05-02 NOTE — Patient Instructions (Signed)
[facility]  Discharge Instructions: Thank you for choosing [facility] to provide your oncology and hematology care.  If you have a lab appointment with the Cancer Center, please come in thru the Main Entrance and check in at the main information desk.  Wear comfortable clothing and clothing appropriate for easy access to any Portacath or PICC line.   We strive to give you quality time with your provider. You may need to reschedule your appointment if you arrive late (15 or more minutes).  Arriving late affects you and other patients whose appointments are after yours.  Also, if you miss three or more appointments without notifying the office, you may be dismissed from the clinic at the provider's discretion.      For prescription refill requests, have your pharmacy contact our office and allow 72 hours for refills to be completed.    Today you received the following chemotherapy and/or immunotherapy agents Velcade.  Bortezomib injection What is this medication? BORTEZOMIB (bor TEZ oh mib) targets proteins in cancer cells and stops the cancer cells from growing. It treats multiple myeloma and mantle cell lymphoma. This medicine may be used for other purposes; ask your health care provider or pharmacist if you have questions. COMMON BRAND NAME(S): Velcade What should I tell my care team before I take this medication? They need to know if you have any of these conditions: dehydration diabetes (high blood sugar) heart disease liver disease tingling of the fingers or toes or other nerve disorder an unusual or allergic reaction to bortezomib, mannitol, boron, other medicines, foods, dyes, or preservatives pregnant or trying to get pregnant breast-feeding How should I use this medication? This medicine is injected into a vein or under the skin. It is given by a health care provider in a hospital or clinic setting. Talk to your health care provider about the use of  this medicine in children. Special care may be needed. Overdosage: If you think you have taken too much of this medicine contact a poison control center or emergency room at once. NOTE: This medicine is only for you. Do not share this medicine with others. What if I miss a dose? Keep appointments for follow-up doses. It is important not to miss your dose. Call your health care provider if you are unable to keep an appointment. What may interact with this medication? This medicine may interact with the following medications: ketoconazole rifampin This list may not describe all possible interactions. Give your health care provider a list of all the medicines, herbs, non-prescription drugs, or dietary supplements you use. Also tell them if you smoke, drink alcohol, or use illegal drugs. Some items may interact with your medicine. What should I watch for while using this medication? Your condition will be monitored carefully while you are receiving this medicine. You may need blood work done while you are taking this medicine. You may get drowsy or dizzy. Do not drive, use machinery, or do anything that needs mental alertness until you know how this medicine affects you. Do not stand up or sit up quickly, especially if you are an older patient. This reduces the risk of dizzy or fainting spells This medicine may increase your risk of getting an infection. Call your health care provider for advice if you get a fever, chills, sore throat, or other symptoms of a cold or flu. Do not treat yourself. Try to avoid being around people who are sick. Check with your health care provider   if you have severe diarrhea, nausea, and vomiting, or if you sweat a lot. The loss of too much body fluid may make it dangerous for you to take this medicine. Do not become pregnant while taking this medicine or for 7 months after stopping it. Women should inform their health care provider if they wish to become pregnant or think  they might be pregnant. Men should not father a child while taking this medicine and for 4 months after stopping it. There is a potential for serious harm to an unborn child. Talk to your health care provider for more information. Do not breast-feed an infant while taking this medicine or for 2 months after stopping it. This medicine may make it more difficult to get pregnant or father a child. Talk to your health care provider if you are concerned about your fertility. What side effects may I notice from receiving this medication? Side effects that you should report to your doctor or health care professional as soon as possible: allergic reactions (skin rash; itching or hives; swelling of the face, lips, or tongue) bleeding (bloody or black, tarry stools; red or dark Gracious Renken urine; spitting up blood or Tyniya Kuyper material that looks like coffee grounds; red spots on the skin; unusual bruising or bleeding from the eye, gums, or nose) blurred vision or changes in vision confusion constipation headache heart failure (trouble breathing; fast, irregular heartbeat; sudden weight gain; swelling of the ankles, feet, hands) infection (fever, chills, cough, sore throat, pain or trouble passing urine) lack or loss of appetite liver injury (dark yellow or Kaiana Marion urine; general ill feeling or flu-like symptoms; loss of appetite, right upper belly pain; yellowing of the eyes or skin) low blood pressure (dizziness; feeling faint or lightheaded, falls; unusually weak or tired) muscle cramps pain, redness, or irritation at site where injected pain, tingling, numbness in the hands or feet seizures trouble breathing unusual bruising or bleeding Side effects that usually do not require medical attention (report to your doctor or health care professional if they continue or are bothersome): diarrhea nausea stomach pain trouble sleeping vomiting This list may not describe all possible side effects. Call your doctor  for medical advice about side effects. You may report side effects to FDA at 1-800-FDA-1088. Where should I keep my medication? This medicine is given in a hospital or clinic. It will not be stored at home. NOTE: This sheet is a summary. It may not cover all possible information. If you have questions about this medicine, talk to your doctor, pharmacist, or health care provider.  2023 Elsevier/Gold Standard (2020-10-12 00:00:00)   To help prevent nausea and vomiting after your treatment, we encourage you to take your nausea medication as directed.  BELOW ARE SYMPTOMS THAT SHOULD BE REPORTED IMMEDIATELY: *FEVER GREATER THAN 100.4 F (38 C) OR HIGHER *CHILLS OR SWEATING *NAUSEA AND VOMITING THAT IS NOT CONTROLLED WITH YOUR NAUSEA MEDICATION *UNUSUAL SHORTNESS OF BREATH *UNUSUAL BRUISING OR BLEEDING *URINARY PROBLEMS (pain or burning when urinating, or frequent urination) *BOWEL PROBLEMS (unusual diarrhea, constipation, pain near the anus) TENDERNESS IN MOUTH AND THROAT WITH OR WITHOUT PRESENCE OF ULCERS (sore throat, sores in mouth, or a toothache) UNUSUAL RASH, SWELLING OR PAIN  UNUSUAL VAGINAL DISCHARGE OR ITCHING   Items with * indicate a potential emergency and should be followed up as soon as possible or go to the Emergency Department if any problems should occur.  Please show the CHEMOTHERAPY ALERT CARD or IMMUNOTHERAPY ALERT CARD at check-in to the Emergency Department   check-in to the Emergency Department and triage nurse.  Should you have questions after your visit or need to cancel or reschedule your appointment, please contact [facility] [phone]  and follow the prompts.  Office hours are 8:00 a.m. to 4:30 p.m. Monday - Friday. Please note that voicemails left after 4:00 p.m. may not be returned until the following business day.  We are closed weekends and major holidays. You have access to a nurse at all times for urgent questions. Please call the main number to the clinic [phone] and  follow the prompts.  For any non-urgent questions, you may also contact your provider using MyChart. We now offer e-Visits for anyone 34 and older to request care online for non-urgent symptoms. For details visit [email or web address].   Also download the MyChart app! Go to the app store, search "MyChart", open the app, select [facility], and log in with your MyChart username and password.  Masks are optional in the cancer centers. If you would like for your care team to wear a mask while they are taking care of you, please let them know. For doctor visits, patients may have with them one support person who is at least 65 years old. At this time, visitors are not allowed in the infusion area.

## 2022-05-02 NOTE — Progress Notes (Signed)
Patient presents today for Velcade injection.  Patient is in satisfactory condition with no complaints voiced.  Vital signs are stable.  Labs reviewed.  Creatinine is 3.43, but we will proceed as he is a dialysis patient.  All other labs are within treatment parameters.  Patient declined Compazine tablet today.  We will proceed with injection per MD orders.   Patient tolerated Velcade injection with no complaints voiced.  Site clean and dry with no bruising or swelling noted.  No complaints of pain.  Discharged with vital signs stable and no signs or symptoms of distress noted.

## 2022-05-02 NOTE — Progress Notes (Signed)
Confirmed with MD that all future doses of Velcade need to be in at 1 mg/m2.  Plans updated.  T.O. Dr Rhys Martini, PharmD

## 2022-05-03 DIAGNOSIS — Z4902 Encounter for fitting and adjustment of peritoneal dialysis catheter: Secondary | ICD-10-CM | POA: Diagnosis not present

## 2022-05-03 DIAGNOSIS — N186 End stage renal disease: Secondary | ICD-10-CM | POA: Diagnosis not present

## 2022-05-03 DIAGNOSIS — Z992 Dependence on renal dialysis: Secondary | ICD-10-CM | POA: Diagnosis not present

## 2022-05-03 LAB — KAPPA/LAMBDA LIGHT CHAINS
Kappa free light chain: 37 mg/L — ABNORMAL HIGH (ref 3.3–19.4)
Kappa, lambda light chain ratio: 3.94 — ABNORMAL HIGH (ref 0.26–1.65)
Lambda free light chains: 9.4 mg/L (ref 5.7–26.3)

## 2022-05-06 DIAGNOSIS — N186 End stage renal disease: Secondary | ICD-10-CM | POA: Diagnosis not present

## 2022-05-06 DIAGNOSIS — Z992 Dependence on renal dialysis: Secondary | ICD-10-CM | POA: Diagnosis not present

## 2022-05-07 LAB — PROTEIN ELECTROPHORESIS, SERUM
A/G Ratio: 1.4 (ref 0.7–1.7)
Albumin ELP: 3.5 g/dL (ref 2.9–4.4)
Alpha-1-Globulin: 0.2 g/dL (ref 0.0–0.4)
Alpha-2-Globulin: 0.9 g/dL (ref 0.4–1.0)
Beta Globulin: 0.8 g/dL (ref 0.7–1.3)
Gamma Globulin: 0.6 g/dL (ref 0.4–1.8)
Globulin, Total: 2.5 g/dL (ref 2.2–3.9)
M-Spike, %: 0.2 g/dL — ABNORMAL HIGH
Total Protein ELP: 6 g/dL (ref 6.0–8.5)

## 2022-05-08 DIAGNOSIS — Z992 Dependence on renal dialysis: Secondary | ICD-10-CM | POA: Diagnosis not present

## 2022-05-08 DIAGNOSIS — N186 End stage renal disease: Secondary | ICD-10-CM | POA: Diagnosis not present

## 2022-05-09 ENCOUNTER — Encounter: Payer: Self-pay | Admitting: Internal Medicine

## 2022-05-09 ENCOUNTER — Ambulatory Visit (INDEPENDENT_AMBULATORY_CARE_PROVIDER_SITE_OTHER): Payer: Medicare HMO | Admitting: Internal Medicine

## 2022-05-09 VITALS — BP 126/70 | HR 84 | Resp 18 | Ht 67.0 in | Wt 261.6 lb

## 2022-05-09 DIAGNOSIS — J449 Chronic obstructive pulmonary disease, unspecified: Secondary | ICD-10-CM | POA: Diagnosis not present

## 2022-05-09 DIAGNOSIS — K2100 Gastro-esophageal reflux disease with esophagitis, without bleeding: Secondary | ICD-10-CM

## 2022-05-09 DIAGNOSIS — I259 Chronic ischemic heart disease, unspecified: Secondary | ICD-10-CM | POA: Insufficient documentation

## 2022-05-09 DIAGNOSIS — Z992 Dependence on renal dialysis: Secondary | ICD-10-CM | POA: Diagnosis not present

## 2022-05-09 DIAGNOSIS — C9000 Multiple myeloma not having achieved remission: Secondary | ICD-10-CM | POA: Diagnosis not present

## 2022-05-09 DIAGNOSIS — I10 Essential (primary) hypertension: Secondary | ICD-10-CM

## 2022-05-09 DIAGNOSIS — Z9081 Acquired absence of spleen: Secondary | ICD-10-CM

## 2022-05-09 DIAGNOSIS — N186 End stage renal disease: Secondary | ICD-10-CM | POA: Diagnosis not present

## 2022-05-09 DIAGNOSIS — J439 Emphysema, unspecified: Secondary | ICD-10-CM

## 2022-05-09 LAB — IMMUNOFIXATION ELECTROPHORESIS
IgA: 26 mg/dL — ABNORMAL LOW (ref 61–437)
IgG (Immunoglobin G), Serum: 711 mg/dL (ref 603–1613)
IgM (Immunoglobulin M), Srm: 11 mg/dL — ABNORMAL LOW (ref 20–172)
Total Protein ELP: 5.8 g/dL — ABNORMAL LOW (ref 6.0–8.5)

## 2022-05-09 MED ORDER — PANTOPRAZOLE SODIUM 40 MG PO TBEC
40.0000 mg | DELAYED_RELEASE_TABLET | Freq: Every day | ORAL | 3 refills | Status: AC
Start: 2022-05-09 — End: ?

## 2022-05-09 MED ORDER — ALBUTEROL SULFATE HFA 108 (90 BASE) MCG/ACT IN AERS: 2.0000 | INHALATION_SPRAY | Freq: Four times a day (QID) | RESPIRATORY_TRACT | 5 refills | Status: AC | PRN

## 2022-05-09 MED ORDER — BUDESONIDE-FORMOTEROL FUMARATE 80-4.5 MCG/ACT IN AERO: 2.0000 | INHALATION_SPRAY | Freq: Two times a day (BID) | RESPIRATORY_TRACT | 5 refills | Status: AC | PRN

## 2022-05-09 NOTE — Assessment & Plan Note (Signed)
BP Readings from Last 1 Encounters:  05/09/22 126/70   Well-controlled Counseled for compliance with the medications Advised DASH diet and moderate exercise/walking, at least 150 mins/week

## 2022-05-09 NOTE — Progress Notes (Signed)
Established Patient Office Visit  Subjective:  Patient ID: Ernest Barnett, male    DOB: 1955-11-05  Age: 66 y.o. MRN: [id number]  CC:  Chief Complaint  Patient presents with   Follow-up    Follow up pt has swollen ankles and shoulders have arthritis    HPI Ernest Barnett is a 66 y.o. male with past medical history of HTN, ESRD, COPD, multiple myeloma, GERD, HLD and morbid obesity who presents for f/u of his chronic medical conditions.  HTN: BP is well-controlled. Takes medications regularly. Patient denies headache, dizziness, chest pain, dyspnea or palpitations.  COPD: Has quit smoking.  He uses Breztri and as needed albuterol for it.  Denies any dyspnea or wheezing currently.  ESRD: He has HD catheter and PD catheter both.  He is getting trained for using peritoneal dialysis.  He has chronic leg swelling, but denies any dysuria, hematuria or flank pain.  He takes torsemide as well.  He has history of BPH, for which he takes tamsulosin.  GERD: He takes pantoprazole twice daily.  He denies epigastric pain, nausea or vomiting currently.  Denies any melena or hematochezia.    Past Medical History:  Diagnosis Date   Chronic kidney disease    COPD (chronic obstructive pulmonary disease) (HCC)    CVA (cerebral vascular accident) ([place]) 2013   Cystoid macular edema of left eye 03/07/2021   GERD (gastroesophageal reflux disease)    HOH (hard of hearing)    Hypercholesteremia    Hypertension    Left epiretinal membrane 03/07/2021   Pseudophakia 03/07/2021   Stroke ([place]) 03/24/2012   left sided weakness   Vitamin D deficiency     Past Surgical History:  Procedure Laterality Date   A/V FISTULAGRAM Right 01/29/2022   Procedure: A/V Fistulagram;  Surgeon: Serafina Mitchell, MD;  Location: [facility];  Service: Cardiovascular;  Laterality: Right;   AV FISTULA PLACEMENT Right 02/08/2021   Procedure: RIGHT ARM ARTERIOVENOUS (AV) FISTULA CREATION;  Surgeon: [name], MD;  Location:  [facility];  Service: Vascular;  Laterality: Right;   BIOPSY  11/12/2018   Procedure: BIOPSY;  Surgeon: Rogene Houston, MD;  [facility];  Service: Endoscopy;;  colon   BIOPSY  05/10/2020   Procedure: BIOPSY;  Surgeon: Rogene Houston, MD;  [facility];  Service: Endoscopy;;  esophagus   CATARACT EXTRACTION     right eye   CATARACT EXTRACTION W/PHACO  10/08/2012   Procedure: CATARACT EXTRACTION PHACO AND INTRAOCULAR LENS PLACEMENT ([place]);  Surgeon: Tonny Branch, MD;  Location: [facility];  Service: Ophthalmology;  Laterality: Left;  CDE:6.64   CHOLECYSTECTOMY     [place]   COLONOSCOPY N/A 11/12/2018   Procedure: COLONOSCOPY;  Surgeon: Rogene Houston, MD;  [facility];  Service: Endoscopy;  Laterality: N/A;  1030   ELBOW FRACTURE SURGERY     left   ESOPHAGEAL DILATION N/A 11/12/2018   Procedure: ESOPHAGEAL DILATION;  Surgeon: Rogene Houston, MD;  [facility];  Service: Endoscopy;  Laterality: N/A;   ESOPHAGEAL DILATION N/A 05/10/2020   Procedure: ESOPHAGEAL DILATION;  Surgeon: Rogene Houston, MD;  [facility];  Service: Endoscopy;  Laterality: N/A;   ESOPHAGOGASTRODUODENOSCOPY N/A 11/12/2018   Procedure: ESOPHAGOGASTRODUODENOSCOPY (EGD);  Surgeon: Rogene Houston, MD;  [facility];  Service: Endoscopy;  Laterality: N/A;   ESOPHAGOGASTRODUODENOSCOPY N/A 05/10/2020   Procedure: ESOPHAGOGASTRODUODENOSCOPY (EGD);  Surgeon: Rogene Houston, MD;  [facility]  [facility];  Service: Endoscopy;  Laterality: N/A;  210   HERNIA REPAIR     right inguinal   HYDROCELE EXCISION / REPAIR     IR FLUORO GUIDE CV LINE LEFT  08/17/2021   IR US GUIDE VASC ACCESS LEFT  08/17/2021   LIGATION OF COMPETING BRANCHES OF ARTERIOVENOUS FISTULA Right 09/11/2021   Procedure: LIGATION OF COMPETING BRANCHES OF RIGHT ARM ARTERIOVENOUS FISTULA;  Surgeon: [name], MD;  Location: [facility];  Service: Vascular;  Laterality: Right;   PERIPHERAL VASCULAR BALLOON  ANGIOPLASTY Right 01/29/2022   Procedure: PERIPHERAL VASCULAR BALLOON ANGIOPLASTY;  Surgeon: Serafina Mitchell, MD;  Location: [facility];  Service: Cardiovascular;  Laterality: Right;  arm fistula   POLYPECTOMY  11/12/2018   Procedure: POLYPECTOMY;  Surgeon: Rogene Houston, MD;  [facility];  Service: Endoscopy;;  colon    RETINAL DETACHMENT SURGERY Left 2019   SPLENECTOMY, TOTAL     TONSILLECTOMY     VOCAL CORD INJECTION     removal of polyp-2005    History reviewed. No pertinent family history.  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  Tobacco Use   Smoking status: Former    Packs/day: 3.00    Years: 30.00    Total pack years: 90.00    Types: Cigarettes   Smokeless tobacco: Current   Tobacco comments:    Currently vape  Vaping Use   Vaping Use: Every day   Substances: Nicotine  Substance and Sexual Activity   Alcohol use: Yes    Alcohol/week: 1.0 - 2.0 standard drink of alcohol    Types: 1 - 2 Cans of beer per week    Comment: Occasionally   Drug use: No   Sexual activity: Not Currently    Birth control/protection: Abstinence, None  Other Topics Concern   Not on file  Social History Narrative   2 children, both nearby   Social Determinants of Health   Financial Resource Strain: Low Risk  (05/29/2021)   Overall Financial Resource Strain (CARDIA)    Difficulty of Paying Living Expenses: Not hard at all  Food Insecurity: No Food Insecurity (05/29/2021)   Hunger Vital Sign    Worried About Running Out of Food in the Last Year: Never true    Ran Out of Food in the Last Year: Never true  Transportation Needs: No Transportation Needs (05/29/2021)   PRAPARE - Radiographer, therapeutic (Medical): No    Lack of Transportation (Non-Medical): No  Physical Activity: Insufficiently Active (05/29/2021)   Exercise Vital Sign    Days of Exercise per Week: 3 days    Minutes of Exercise per Session: 30 min  Stress: No Stress Concern Present (05/29/2021)   [place]    Feeling of Stress : Only a little  Social Connections: Socially Isolated (05/29/2021)   Social Connection and Isolation Panel [NHANES]    Frequency of Communication with Friends and Family: More than three times a week    Frequency of Social Gatherings with Friends and Family: More than three times a week    Attends Religious Services: Never  Active Member of Clubs or Organizations: No    Attends Archivist Meetings: Never    Marital Status: Divorced  Human resources officer Violence: Not At Risk (05/29/2021)   Humiliation, Afraid, Rape, and Kick questionnaire    Fear of Current or Ex-Partner: No    Emotionally Abused: No    Physically Abused: No    Sexually Abused: No    Outpatient Medications Prior to Visit  Medication Sig Dispense Refill   acyclovir (ZOVIRAX) 200 MG capsule Take 1 capsule (200 mg total) by mouth 2 (two) times daily. 60 capsule 6   aspirin EC 81 MG tablet Take 1 tablet (81 mg total) by mouth daily. 30 tablet 11   bortezomib IV (VELCADE) 3.5 MG injection Inject 1.5 mg/m2 into the vein once a week.     dexamethasone (DECADRON) 4 MG tablet Take 2.5 tablets (10 mg total) by mouth once a week. Take at home along with Tylenol and Benadryl weekly on the day of daratumumab injection. 30 tablet 2   docusate sodium (COLACE) 100 MG capsule Take 100 mg by mouth daily.     gabapentin (NEURONTIN) 300 MG capsule Take 1 capsule by mouth twice daily 60 capsule 6   HYDROcodone-acetaminophen (NORCO/VICODIN) 5-325 MG tablet Take by mouth.     midodrine (PROAMATINE) 10 MG tablet Take by mouth.     Omega-3 Fatty Acids (FISH OIL)  1000 MG CAPS Take 1,000 mg by mouth in the morning, at noon, and at bedtime.      oxyCODONE (OXY IR/ROXICODONE) 5 MG immediate release tablet Take 1 tablet (5 mg total) by mouth 2 (two) times daily as needed for severe pain. 30 tablet 0   polyethylene glycol (MIRALAX / GLYCOLAX) packet Take 17 g by mouth daily. Patient states that he takes as needed. 30 each 5   pravastatin (PRAVACHOL) 80 MG tablet Take 1 tablet (80 mg total) by mouth daily. 90 tablet 3   sildenafil (REVATIO) 20 MG tablet Take 1 tablet (20 mg total) by mouth 5 (five) times daily as needed. 30 tablet 2   sodium bicarbonate 325 MG tablet Take 325 mg by mouth 3 (three) times daily.     tamsulosin (FLOMAX) 0.4 MG CAPS capsule Take 1 capsule (0.4 mg total) by mouth daily. 30 capsule 0   tiZANidine (ZANAFLEX) 2 MG tablet TAKE 1 TABLET BY MOUTH EVERY 6 HOURS AS NEEDED FOR MUSCLE SPASM 30 tablet 0   torsemide (DEMADEX) 100 MG tablet Take 100 mg by mouth 2 (two) times daily.     albuterol (VENTOLIN HFA) 108 (90 Base) MCG/ACT inhaler Inhale 2 puffs into the lungs every 6 (six) hours as needed for wheezing or shortness of breath. 8 g 0   budesonide-formoterol (SYMBICORT) 80-4.5 MCG/ACT inhaler Inhale 2 puffs into the lungs 2 (two) times daily as needed (shortness of breath).     pantoprazole (PROTONIX) 40 MG tablet Take 1 tablet (40 mg total) by mouth 2 (two) times daily. 180 tablet 3   calcitRIOL (ROCALTROL) 0.25 MCG capsule Take 0.25 mcg by mouth daily. (Patient not taking: Reported on 05/09/2022)     cholecalciferol (VITAMIN D3) 25 MCG (1000 UNIT) tablet Take 2,000 Units by mouth daily. (Patient not taking: Reported on 05/09/2022)     Facility-Administered Medications Prior to Visit  Medication Dose Route Frequency Provider Last Rate Last Admin   palonosetron (ALOXI) 0.25 MG/5ML injection             No Known Allergies  ROS Review of Systems  Constitutional:  Negative for chills and fever.  HENT:  Negative for congestion and sore  throat.   Eyes:  Negative for pain and discharge.  Respiratory:  Negative for cough and shortness of breath.   Cardiovascular:  Positive for leg swelling. Negative for chest pain and palpitations.  Gastrointestinal:  Negative for diarrhea, nausea and vomiting.  Endocrine: Negative for polydipsia and polyuria.  Genitourinary:  Negative for dysuria and hematuria.  Musculoskeletal:  Positive for arthralgias and back pain. Negative for neck pain and neck stiffness.  Skin:  Negative for rash.  Neurological:  Negative for dizziness, weakness, numbness and headaches.  Psychiatric/Behavioral:  Negative for agitation and behavioral problems.       Objective:    Physical Exam Vitals reviewed.  Constitutional:      General: He is not in acute distress.    Appearance: He is obese. He is not diaphoretic.  HENT:     Head: Normocephalic and atraumatic.     Nose: Nose normal.     Mouth/Throat:     Mouth: Mucous membranes are moist.  Eyes:     General: No scleral icterus.    Extraocular Movements: Extraocular movements intact.  Cardiovascular:     Rate and Rhythm: Normal rate and regular rhythm.     Heart sounds: Normal heart sounds. No murmur heard. Pulmonary:     Breath sounds: Normal breath sounds. No wheezing or rales.  Chest:     Comments: Had HD catheter on left side Abdominal:     Palpations: Abdomen is soft.     Tenderness: There is no abdominal tenderness.     Comments: Has peritoneal dialysis catheter  Musculoskeletal:     Cervical back: Neck supple. No tenderness.     Lumbar back: Tenderness present. Decreased range of motion.     Right lower leg: Edema (1+) present.     Left lower leg: Edema (1+) present.  Skin:    General: Skin is warm.     Findings: No rash.  Neurological:     General: No focal deficit present.     Mental Status: He is alert and oriented to person, place, and time.     Sensory: No sensory deficit.     Motor: No weakness.  Psychiatric:        Mood  and Affect: Mood normal.        Behavior: Behavior normal.     BP 126/70 (BP Location: Right Arm, Patient Position: Sitting, Cuff Size: Normal)   Pulse 84   Resp 18   Ht $R'5\' 7"'oJ$  (1.702 m)   Wt 261 lb 9.6 oz (118.7 kg)   SpO2 97%   BMI 40.97 kg/m  Wt Readings from Last 3 Encounters:  05/09/22 261 lb 9.6 oz (118.7 kg)  05/02/22 257 lb 11.5 oz (116.9 kg)  04/18/22 251 lb (113.9 kg)    Lab Results  Component Value Date   TSH 1.200 06/20/2021   Lab Results  Component Value Date   WBC 4.7 05/02/2022   HGB 11.1 (L) 05/02/2022   HCT 33.7 (L) 05/02/2022   MCV 98.3 05/02/2022   PLT 139 (L) 05/02/2022   Lab Results  Component Value Date   NA 139 05/02/2022   K 3.9 05/02/2022   CO2 21 (L) 05/02/2022   GLUCOSE 103 (H) 05/02/2022   BUN 26 (H) 05/02/2022   CREATININE 3.43 (H) 05/02/2022   BILITOT 0.4 05/02/2022   ALKPHOS 44 05/02/2022   AST 13 (L) 05/02/2022   ALT  18 05/02/2022   PROT 6.4 (L) 05/02/2022   ALBUMIN 3.6 05/02/2022   CALCIUM 9.0 05/02/2022   ANIONGAP 8 05/02/2022   Lab Results  Component Value Date   CHOL 179 06/20/2021   Lab Results  Component Value Date   HDL 34 (L) 06/20/2021   Lab Results  Component Value Date   LDLCALC 106 (H) 06/20/2021   Lab Results  Component Value Date   TRIG 223 (H) 06/20/2021   No results found for: "CHOLHDL" No results found for: "HGBA1C"    Assessment & Plan:   Problem List Items Addressed This Visit       Cardiovascular and Mediastinum   Essential hypertension, benign    BP Readings from Last 1 Encounters:  05/09/22 126/70  Well-controlled Counseled for compliance with the medications Advised DASH diet and moderate exercise/walking, at least 150 mins/week         Respiratory   COPD (chronic obstructive pulmonary disease) ([place]) - Primary    Well controlled with Symbicort and albuterol as needed      Relevant Medications   albuterol (VENTOLIN HFA) 108 (90 Base) MCG/ACT inhaler   budesonide-formoterol  (SYMBICORT) 80-4.5 MCG/ACT inhaler     Digestive   GERD (gastroesophageal reflux disease)    Has h/o GERD with esophagitis Takes pantoprazole 40 mg twice daily, advised to cut down to daily for now Avoid hot and spicy food      Relevant Medications   pantoprazole (PROTONIX) 40 MG tablet     Genitourinary   ESRD (end stage renal disease) on dialysis ([place])    Has peritoneal dialysis catheter now as well, getting training to use peritoneal dialysis Followed by nephrology On torsemide On sodium bicarbonate and vitamin D supplement        Other   Multiple myeloma ([place])    On Velcade Followed by heme-onc      Morbid obesity due to excess calories ([place])    Diet modification and moderate exercise advised      S/P splenectomy    Reports h/o splenectomy in childhood Advised to get pneumococcal vaccine -needs to ask heme-onc as he is on chemotherapy currently       Meds ordered this encounter  Medications   albuterol (VENTOLIN HFA) 108 (90 Base) MCG/ACT inhaler    Sig: Inhale 2 puffs into the lungs every 6 (six) hours as needed for wheezing or shortness of breath.    Dispense:  8 g    Refill:  5    Okay to dispense generic alternative as per insurance preference.   budesonide-formoterol (SYMBICORT) 80-4.5 MCG/ACT inhaler    Sig: Inhale 2 puffs into the lungs 2 (two) times daily as needed (shortness of breath).    Dispense:  10.2 each    Refill:  5   pantoprazole (PROTONIX) 40 MG tablet    Sig: Take 1 tablet (40 mg total) by mouth daily.    Dispense:  90 tablet    Refill:  3    Follow-up: Return in about 6 months (around 11/09/2022) for Annual physical.    Lindell Spar, MD

## 2022-05-09 NOTE — Assessment & Plan Note (Signed)
On Velcade Followed by heme-onc

## 2022-05-09 NOTE — Patient Instructions (Signed)
Please take Pantoprazole once daily.  Please continue taking medications as prescribed.  Please continue to follow low salt diet and ambulate as tolerated.

## 2022-05-09 NOTE — Assessment & Plan Note (Addendum)
Diet modification and moderate exercise advised. 

## 2022-05-09 NOTE — Assessment & Plan Note (Signed)
Reports h/o splenectomy in childhood Advised to get pneumococcal vaccine -needs to ask heme-onc as he is on chemotherapy currently

## 2022-05-09 NOTE — Assessment & Plan Note (Signed)
Has h/o GERD with esophagitis Takes pantoprazole 40 mg twice daily, advised to cut down to daily for now Avoid hot and spicy food

## 2022-05-09 NOTE — Assessment & Plan Note (Addendum)
Well controlled with Symbicort and albuterol as needed, refills provided

## 2022-05-09 NOTE — Assessment & Plan Note (Signed)
Has peritoneal dialysis catheter now as well, getting training to use peritoneal dialysis Followed by nephrology On torsemide On sodium bicarbonate and vitamin D supplement

## 2022-05-10 DIAGNOSIS — Z992 Dependence on renal dialysis: Secondary | ICD-10-CM | POA: Diagnosis not present

## 2022-05-10 DIAGNOSIS — N186 End stage renal disease: Secondary | ICD-10-CM | POA: Diagnosis not present

## 2022-05-13 DIAGNOSIS — Z992 Dependence on renal dialysis: Secondary | ICD-10-CM | POA: Diagnosis not present

## 2022-05-13 DIAGNOSIS — N186 End stage renal disease: Secondary | ICD-10-CM | POA: Diagnosis not present

## 2022-05-14 DIAGNOSIS — Z992 Dependence on renal dialysis: Secondary | ICD-10-CM | POA: Diagnosis not present

## 2022-05-14 DIAGNOSIS — N186 End stage renal disease: Secondary | ICD-10-CM | POA: Diagnosis not present

## 2022-05-16 ENCOUNTER — Encounter (HOSPITAL_COMMUNITY): Payer: Self-pay | Admitting: Hematology

## 2022-05-16 ENCOUNTER — Inpatient Hospital Stay (HOSPITAL_COMMUNITY): Payer: Medicare HMO | Attending: Hematology | Admitting: Hematology

## 2022-05-16 ENCOUNTER — Inpatient Hospital Stay (HOSPITAL_COMMUNITY): Payer: Medicare HMO

## 2022-05-16 VITALS — BP 115/74 | HR 103 | Temp 97.5°F | Resp 20 | Ht 67.0 in | Wt 250.7 lb

## 2022-05-16 VITALS — HR 88 | Resp 18

## 2022-05-16 DIAGNOSIS — C9000 Multiple myeloma not having achieved remission: Secondary | ICD-10-CM | POA: Insufficient documentation

## 2022-05-16 DIAGNOSIS — Z79899 Other long term (current) drug therapy: Secondary | ICD-10-CM | POA: Insufficient documentation

## 2022-05-16 DIAGNOSIS — Z5112 Encounter for antineoplastic immunotherapy: Secondary | ICD-10-CM | POA: Diagnosis present

## 2022-05-16 DIAGNOSIS — D472 Monoclonal gammopathy: Secondary | ICD-10-CM

## 2022-05-16 LAB — COMPREHENSIVE METABOLIC PANEL
ALT: 16 U/L (ref 0–44)
AST: 15 U/L (ref 15–41)
Albumin: 3.9 g/dL (ref 3.5–5.0)
Alkaline Phosphatase: 46 U/L (ref 38–126)
Anion gap: 9 (ref 5–15)
BUN: 30 mg/dL — ABNORMAL HIGH (ref 8–23)
CO2: 26 mmol/L (ref 22–32)
Calcium: 9.2 mg/dL (ref 8.9–10.3)
Chloride: 105 mmol/L (ref 98–111)
Creatinine, Ser: 3.73 mg/dL — ABNORMAL HIGH (ref 0.61–1.24)
GFR, Estimated: 17 mL/min — ABNORMAL LOW (ref >60)
Glucose, Bld: 131 mg/dL — ABNORMAL HIGH (ref 70–99)
Potassium: 3 mmol/L — ABNORMAL LOW (ref 3.5–5.1)
Sodium: 140 mmol/L (ref 135–145)
Total Bilirubin: 0.7 mg/dL (ref 0.3–1.2)
Total Protein: 6.9 g/dL (ref 6.5–8.1)

## 2022-05-16 LAB — CBC WITH DIFFERENTIAL/PLATELET
Abs Immature Granulocytes: 0.01 10*3/uL (ref 0.00–0.07)
Basophils Absolute: 0 10*3/uL (ref 0.0–0.1)
Basophils Relative: 1 %
Eosinophils Absolute: 0.1 10*3/uL (ref 0.0–0.5)
Eosinophils Relative: 2 %
HCT: 35.5 % — ABNORMAL LOW (ref 39.0–52.0)
Hemoglobin: 11.9 g/dL — ABNORMAL LOW (ref 13.0–17.0)
Immature Granulocytes: 0 %
Lymphocytes Relative: 51 %
Lymphs Abs: 2.6 10*3/uL (ref 0.7–4.0)
MCH: 33.1 pg (ref 26.0–34.0)
MCHC: 33.5 g/dL (ref 30.0–36.0)
MCV: 98.9 fL (ref 80.0–100.0)
Monocytes Absolute: 0.4 10*3/uL (ref 0.1–1.0)
Monocytes Relative: 7 %
Neutro Abs: 2 10*3/uL (ref 1.7–7.7)
Neutrophils Relative %: 39 %
Platelets: 135 10*3/uL — ABNORMAL LOW (ref 150–400)
RBC: 3.59 MIL/uL — ABNORMAL LOW (ref 4.22–5.81)
RDW: 16.1 % — ABNORMAL HIGH (ref 11.5–15.5)
WBC: 5 10*3/uL (ref 4.0–10.5)
nRBC: 0 % (ref 0.0–0.2)

## 2022-05-16 LAB — MAGNESIUM: Magnesium: 2.3 mg/dL (ref 1.7–2.4)

## 2022-05-16 MED ORDER — BORTEZOMIB CHEMO SQ INJECTION 3.5 MG (2.5MG/ML)
1.0000 mg/m2 | Freq: Once | INTRAMUSCULAR | Status: AC
  Administered 2022-05-16: 2.25 mg via SUBCUTANEOUS
  Filled 2022-05-16: qty 0.9

## 2022-05-16 MED ORDER — DARATUMUMAB-HYALURONIDASE-FIHJ 1800-30000 MG-UT/15ML ~~LOC~~ SOLN
1800.0000 mg | Freq: Once | SUBCUTANEOUS | Status: AC
  Administered 2022-05-16: 1800 mg via SUBCUTANEOUS
  Filled 2022-05-16: qty 15

## 2022-05-16 NOTE — Patient Instructions (Signed)
[facility]  Discharge Instructions: Thank you for choosing [facility] to provide your oncology and hematology care.  If you have a lab appointment with the [facility], please come in thru the Main Entrance and check in at the main information desk.  Wear comfortable clothing and clothing appropriate for easy access to any Portacath or PICC line.   We strive to give you quality time with your provider. You may need to reschedule your appointment if you arrive late (15 or more minutes).  Arriving late affects you and other patients whose appointments are after yours.  Also, if you miss three or more appointments without notifying the office, you may be dismissed from the clinic at the provider's discretion.      For prescription refill requests, have your pharmacy contact our office and allow 72 hours for refills to be completed.    Today you received the following chemotherapy and/or immunotherapy agents velcade and darzalex.  Daratumumab injection What is this medication? DARATUMUMAB (dar a toom ue mab) is a monoclonal antibody. It is used to treat multiple myeloma. This medicine may be used for other purposes; ask your health care provider or pharmacist if you have questions. COMMON BRAND NAME(S): DARZALEX What should I tell my care team before I take this medication? They need to know if you have any of these conditions: hereditary fructose intolerance infection (especially a virus infection such as chickenpox, herpes, or hepatitis B virus) lung or breathing disease (asthma, COPD) an unusual or allergic reaction to daratumumab, sorbitol, other medicines, foods, dyes, or preservatives pregnant or trying to get pregnant breast-feeding How should I use this medication? This medicine is for infusion into a vein. It is given by a health care professional in a hospital or clinic setting. Talk to your pediatrician regarding the use of this medicine in children.  Special care may be needed. Overdosage: If you think you have taken too much of this medicine contact a poison control center or emergency room at once. NOTE: This medicine is only for you. Do not share this medicine with others. What if I miss a dose? Keep appointments for follow-up doses as directed. It is important not to miss your dose. Call your doctor or health care professional if you are unable to keep an appointment. What may interact with this medication? Interactions have not been studied. This list may not describe all possible interactions. Give your health care provider a list of all the medicines, herbs, non-prescription drugs, or dietary supplements you use. Also tell them if you smoke, drink alcohol, or use illegal drugs. Some items may interact with your medicine. What should I watch for while using this medication? Your condition will be monitored carefully while you are receiving this medicine. This medicine can cause serious allergic reactions. To reduce your risk, your health care provider may give you other medicine to take before receiving this one. Be sure to follow the directions from your health care provider. This medicine can affect the results of blood tests to match your blood type. These changes can last for up to 6 months after the final dose. Your healthcare provider will do blood tests to match your blood type before you start treatment. Tell all of your healthcare providers that you are being treated with this medicine before receiving a blood transfusion. This medicine can affect the results of some tests used to determine treatment response; extra tests may be needed to evaluate response. Do not become  pregnant while taking this medicine or for 3 months after stopping it. Women should inform their health care provider if they wish to become pregnant or think they might be pregnant. There is a potential for serious side effects to an unborn child. Talk to your health  care provider for more information. Do not breast-feed an infant while taking this medicine. What side effects may I notice from receiving this medication? Side effects that you should report to your care team as soon as possible: Allergic reactions--skin rash, itching or hives, swelling of the face, lips, or tongue Blurred vision Infection--fever, chills, cough, sore throat, pain or trouble passing urine Infusion reactions--dizziness, fast heartbeat, feeling faint or lightheaded, falls, headache, increase in blood pressure, nausea, vomiting, or wheezing or trouble breathing with loud or whistling sounds Unusual bleeding or bruising Side effects that usually do not require medical attention (report to your care team if they continue or are bothersome): Constipation Diarrhea Pain, tingling, numbness in the hands or feet Swelling of the ankles, feet, hands Tiredness This list may not describe all possible side effects. Call your doctor for medical advice about side effects. You may report side effects to FDA at 1-800-FDA-1088. Where should I keep my medication? This drug is given in a hospital or clinic and will not be stored at home. NOTE: This sheet is a summary. It may not cover all possible information. If you have questions about this medicine, talk to your doctor, pharmacist, or health care provider.  2023 Elsevier/Gold Standard (2021-03-29 00:00:00)       To help prevent nausea and vomiting after your treatment, we encourage you to take your nausea medication as directed.  BELOW ARE SYMPTOMS THAT SHOULD BE REPORTED IMMEDIATELY: *FEVER GREATER THAN 100.4 F (38 C) OR HIGHER *CHILLS OR SWEATING *NAUSEA AND VOMITING THAT IS NOT CONTROLLED WITH YOUR NAUSEA MEDICATION *UNUSUAL SHORTNESS OF BREATH *UNUSUAL BRUISING OR BLEEDING *URINARY PROBLEMS (pain or burning when urinating, or frequent urination) *BOWEL PROBLEMS (unusual diarrhea, constipation, pain near the anus) TENDERNESS IN  MOUTH AND THROAT WITH OR WITHOUT PRESENCE OF ULCERS (sore throat, sores in mouth, or a toothache) UNUSUAL RASH, SWELLING OR PAIN  UNUSUAL VAGINAL DISCHARGE OR ITCHING   Items with * indicate a potential emergency and should be followed up as soon as possible or go to the Emergency Department if any problems should occur.  Please show the CHEMOTHERAPY ALERT CARD or IMMUNOTHERAPY ALERT CARD at check-in to the Emergency Department and triage nurse.  Should you have questions after your visit or need to cancel or reschedule your appointment, please contact [facility] [phone]  and follow the prompts.  Office hours are 8:00 a.m. to 4:30 p.m. Monday - Friday. Please note that voicemails left after 4:00 p.m. may not be returned until the following business day.  We are closed weekends and major holidays. You have access to a nurse at all times for urgent questions. Please call the main number to the clinic [phone] and follow the prompts.  For any non-urgent questions, you may also contact your provider using MyChart. We now offer e-Visits for anyone 78 and older to request care online for non-urgent symptoms. For details visit [email or web address].   Also download the MyChart app! Go to the app store, search "MyChart", open the app, select [facility], and log in with your MyChart username and password.  Masks are optional in the cancer centers. If you would like for your care team to wear a mask while they  are taking care of you, please let them know. For doctor visits, patients may have with them one support person who is at least 65 years old. At this time, visitors are not allowed in the infusion area.

## 2022-05-16 NOTE — Progress Notes (Signed)
Ernest Barnett, [place] [address]   CLINIC:  Medical Oncology/Hematology  PCP:  Lindell Spar, MD [address] / [place] [address] [phone]   REASON FOR VISIT:  Follow-up for multiple myeloma  PRIOR THERAPY: CyBorD  NGS Results: not done  CURRENT THERAPY: Daratumumab SQ + Bortezomib + Dexamethasone (DaraVd) q21d / Daratumumab SQ q28d   BRIEF ONCOLOGIC HISTORY:  Oncology History  Multiple myeloma ([place])  03/06/2021 Initial Diagnosis   Multiple myeloma ([place])   03/06/2021 Cancer Staging   Staging form: Plasma Cell Myeloma and Plasma Cell Disorders, AJCC 8th Edition - Clinical stage from 03/06/2021: RISS Stage II (Beta-2-microglobulin (mg/L): 5.5, Albumin (g/dL): 3.5, ISS: Stage III, High-risk cytogenetics: Absent, LDH: Normal) - Signed by Derek Jack, MD on 03/06/2021 Stage prefix: Initial diagnosis Beta 2 microglobulin range (mg/L): Greater than or equal to 5.5 Albumin range (g/dL): Greater than or equal to 3.5 Cytogenetics: No abnormalities Pretreatment monoclonal protein level in serum (M spike) (g/dL): 2 Pretreatment serum free kappa light chain level (g/L): 794 Pretreatment serum free lambda light chain level (g/L): 15.9   03/14/2021 - 07/04/2021 Chemotherapy   Patient is on Treatment Plan : MULTIPLE MYELOMA CyBorD - Weekly Bortezomib     08/30/2021 -  Chemotherapy   Patient is on Treatment Plan : MYELOMA RELAPSED / REFRACTORY Daratumumab SQ + Bortezomib + Dexamethasone (DaraVd) q21d / Daratumumab SQ q28d        CANCER STAGING:  Cancer Staging  Multiple myeloma ([place]) Staging form: Plasma Cell Myeloma and Plasma Cell Disorders, AJCC 8th Edition - Clinical stage from 03/06/2021: RISS Stage II (Beta-2-microglobulin (mg/L): 5.5, Albumin (g/dL): 3.5, ISS: Stage III, High-risk cytogenetics: Absent, LDH: Normal) - Signed by Derek Jack, MD on 03/06/2021   INTERVAL HISTORY:  Ernest Barnett, a 66 y.o. male, returns  for routine follow-up and consideration for next cycle of chemotherapy. Cristoval was last seen on 03/28/2022.  Due for cycle #13 of DaraVd today.   Overall, he tells me he has been feeling pretty well. Yesterday he started dialysis at home. He has lost 15 lbs since his last visit. He denies tingling/numbness.  Overall, he feels ready for next cycle of chemo today.    REVIEW OF SYSTEMS:  Review of Systems  Constitutional:  Negative for appetite change. Unexpected weight change: -15 lbs. Neurological:  Negative for numbness.  Hematological:  Bruises/bleeds easily.  All other systems reviewed and are negative.   PAST MEDICAL/SURGICAL HISTORY:  Past Medical History:  Diagnosis Date   Chronic kidney disease    COPD (chronic obstructive pulmonary disease) (HCC)    CVA (cerebral vascular accident) ([place]) 2013   Cystoid macular edema of left eye 03/07/2021   GERD (gastroesophageal reflux disease)    HOH (hard of hearing)    Hypercholesteremia    Hypertension    Left epiretinal membrane 03/07/2021   Pseudophakia 03/07/2021   Stroke ([place]) 03/24/2012   left sided weakness   Vitamin D deficiency    Past Surgical History:  Procedure Laterality Date   A/V FISTULAGRAM Right 01/29/2022   Procedure: A/V Fistulagram;  Surgeon: Serafina Mitchell, MD;  Location: [facility];  Service: Cardiovascular;  Laterality: Right;   AV FISTULA PLACEMENT Right 02/08/2021   Procedure: RIGHT ARM ARTERIOVENOUS (AV) FISTULA CREATION;  Surgeon: [name], MD;  Location: [facility];  Service: Vascular;  Laterality: Right;   BIOPSY  11/12/2018   Procedure: BIOPSY;  Surgeon: Rogene Houston, MD;  [facility];  Service: Endoscopy;;  colon   BIOPSY  05/10/2020   Procedure: BIOPSY;  Surgeon: Rogene Houston, MD;  [facility];  Service: Endoscopy;;  esophagus   CATARACT EXTRACTION     right eye   CATARACT EXTRACTION [facility]  10/08/2012   Procedure: CATARACT EXTRACTION PHACO AND INTRAOCULAR LENS  PLACEMENT ([place]);  Surgeon: Tonny Branch, MD;  Location: [facility];  Service: Ophthalmology;  Laterality: Left;  CDE:6.64   CHOLECYSTECTOMY     [place]   COLONOSCOPY N/A 11/12/2018   Procedure: COLONOSCOPY;  Surgeon: Rogene Houston, MD;  [facility];  Service: Endoscopy;  Laterality: N/A;  1030   ELBOW FRACTURE SURGERY     left   ESOPHAGEAL DILATION N/A 11/12/2018   Procedure: ESOPHAGEAL DILATION;  Surgeon: Rogene Houston, MD;  [facility];  Service: Endoscopy;  Laterality: N/A;   ESOPHAGEAL DILATION N/A 05/10/2020   Procedure: ESOPHAGEAL DILATION;  Surgeon: Rogene Houston, MD;  [facility];  Service: Endoscopy;  Laterality: N/A;   ESOPHAGOGASTRODUODENOSCOPY N/A 11/12/2018   Procedure: ESOPHAGOGASTRODUODENOSCOPY (EGD);  Surgeon: Rogene Houston, MD;  [facility];  Service: Endoscopy;  Laterality: N/A;   ESOPHAGOGASTRODUODENOSCOPY N/A 05/10/2020   Procedure: ESOPHAGOGASTRODUODENOSCOPY (EGD);  Surgeon: Rogene Houston, MD;  [facility];  Service: Endoscopy;  Laterality: N/A;  210   HERNIA REPAIR     right inguinal   HYDROCELE EXCISION / REPAIR     IR FLUORO GUIDE CV LINE LEFT  08/17/2021   IR US GUIDE VASC ACCESS LEFT  08/17/2021   LIGATION OF COMPETING BRANCHES OF ARTERIOVENOUS FISTULA Right 09/11/2021   Procedure: LIGATION OF COMPETING BRANCHES OF RIGHT ARM ARTERIOVENOUS FISTULA;  Surgeon: [name], MD;  Location: [facility];  Service: Vascular;  Laterality: Right;   PERIPHERAL VASCULAR BALLOON ANGIOPLASTY Right 01/29/2022   Procedure: PERIPHERAL VASCULAR BALLOON ANGIOPLASTY;  Surgeon: Serafina Mitchell, MD;  Location: [facility];  Service: Cardiovascular;  Laterality: Right;  arm fistula   POLYPECTOMY  11/12/2018   Procedure: POLYPECTOMY;  Surgeon: Rogene Houston, MD;  [facility];  Service: Endoscopy;;  colon    RETINAL DETACHMENT SURGERY Left 2019   SPLENECTOMY, TOTAL     TONSILLECTOMY     VOCAL CORD INJECTION      removal of polyp-2005    SOCIAL HISTORY:  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  Tobacco Use   Smoking status: Former    Packs/day: 3.00    Years: 30.00    Total pack years: 90.00    Types: Cigarettes   Smokeless tobacco: Current   Tobacco comments:    Currently vape  Vaping Use   Vaping Use: Every day   Substances: Nicotine  Substance and Sexual Activity   Alcohol use: Yes    Alcohol/week: 1.0 - 2.0 standard drink of alcohol    Types: 1 - 2 Cans of beer per week    Comment: Occasionally   Drug use: No   Sexual activity: Not Currently    Birth control/protection: Abstinence, None  Other Topics Concern   Not on file  Social History  Narrative   2 children, both nearby   Social Determinants of Health   Financial Resource Strain: Low Risk  (05/29/2021)   Overall Financial Resource Strain (CARDIA)    Difficulty of Paying Living Expenses: Not hard at all  Food Insecurity: No Food Insecurity (05/29/2021)   Hunger Vital Sign    Worried About Running Out of Food in the Last Year: Never true    Ran Out of Food in the Last Year: Never true  Transportation Needs: No Transportation Needs (05/29/2021)   PRAPARE - Hydrologist (Medical): No    Lack of Transportation (Non-Medical): No  Physical Activity: Insufficiently Active (05/29/2021)   Exercise Vital Sign    Days of Exercise per Week: 3 days    Minutes of Exercise per Session: 30 min  Stress: No Stress Concern Present (05/29/2021)   [place]    Feeling of Stress : Only a little  Social Connections: Socially Isolated (05/29/2021)   Social Connection and Isolation Panel  [NHANES]    Frequency of Communication with Friends and Family: More than three times a week    Frequency of Social Gatherings with Friends and Family: More than three times a week    Attends Religious Services: Never    Marine scientist or Organizations: No    Attends Archivist Meetings: Never    Marital Status: Divorced  Human resources officer Violence: Not At Risk (05/29/2021)   Humiliation, Afraid, Rape, and Kick questionnaire    Fear of Current or Ex-Partner: No    Emotionally Abused: No    Physically Abused: No    Sexually Abused: No    FAMILY HISTORY:  History reviewed. No pertinent family history.  CURRENT MEDICATIONS:  Current Outpatient Medications  Medication Sig Dispense Refill   acyclovir (ZOVIRAX) 200 MG capsule Take 1 capsule (200 mg total) by mouth 2 (two) times daily. 60 capsule 6   albuterol (VENTOLIN HFA) 108 (90 Base) MCG/ACT inhaler Inhale 2 puffs into the lungs every 6 (six) hours as needed for wheezing or shortness of breath. 8 g 5   aspirin EC 81 MG tablet Take 1 tablet (81 mg total) by mouth daily. 30 tablet 11   bortezomib IV (VELCADE) 3.5 MG injection Inject 1.5 mg/m2 into the vein once a week.     budesonide-formoterol (SYMBICORT) 80-4.5 MCG/ACT inhaler Inhale 2 puffs into the lungs 2 (two) times daily as needed (shortness of breath). 10.2 each 5   dexamethasone (DECADRON) 4 MG tablet Take 2.5 tablets (10 mg total) by mouth once a week. Take at home along with Tylenol and Benadryl weekly on the day of daratumumab injection. 30 tablet 2   docusate sodium (COLACE) 100 MG capsule Take 100 mg by mouth daily.     gabapentin (NEURONTIN) 300 MG capsule Take 1 capsule by mouth twice daily 60 capsule 6   HYDROcodone-acetaminophen (NORCO/VICODIN) 5-325 MG tablet Take by mouth.     midodrine (PROAMATINE) 10 MG tablet Take by mouth.     multivitamin (RENA-VIT) TABS tablet Take 1 tablet by mouth daily.     Omega-3 Fatty Acids (FISH OIL) 1000 MG CAPS Take 1,000  mg by mouth in the morning, at noon, and at bedtime.      oxyCODONE (OXY IR/ROXICODONE) 5 MG immediate release tablet Take 1 tablet (5 mg total) by mouth 2 (two) times daily as needed for severe pain. 30 tablet 0   pantoprazole (PROTONIX) 40  MG tablet Take 1 tablet (40 mg total) by mouth daily. 90 tablet 3   polyethylene glycol (MIRALAX / GLYCOLAX) packet Take 17 g by mouth daily. Patient states that he takes as needed. 30 each 5   pravastatin (PRAVACHOL) 80 MG tablet Take 1 tablet (80 mg total) by mouth daily. 90 tablet 3   sildenafil (REVATIO) 20 MG tablet Take 1 tablet (20 mg total) by mouth 5 (five) times daily as needed. 30 tablet 2   sodium bicarbonate 325 MG tablet Take 325 mg by mouth 3 (three) times daily.     tamsulosin (FLOMAX) 0.4 MG CAPS capsule Take 1 capsule (0.4 mg total) by mouth daily. 30 capsule 0   tiZANidine (ZANAFLEX) 2 MG tablet TAKE 1 TABLET BY MOUTH EVERY 6 HOURS AS NEEDED FOR MUSCLE SPASM 30 tablet 0   torsemide (DEMADEX) 100 MG tablet Take 100 mg by mouth 2 (two) times daily.     No current facility-administered medications for this visit.   Facility-Administered Medications Ordered in Other Visits  Medication Dose Route Frequency Provider Last Rate Last Admin   palonosetron (ALOXI) 0.25 MG/5ML injection             ALLERGIES:  No Known Allergies  PHYSICAL EXAM:  Performance status (ECOG): 1 - Symptomatic but completely ambulatory  Vitals:   05/16/22 1123  BP: 115/74  Pulse: (!) 103  Resp: 20  Temp: (!) 97.5 F (36.4 C)  SpO2: 94%   Wt Readings from Last 3 Encounters:  05/16/22 250 lb 11.2 oz (113.7 kg)  05/09/22 261 lb 9.6 oz (118.7 kg)  05/02/22 257 lb 11.5 oz (116.9 kg)   Physical Exam Vitals reviewed.  Constitutional:      Appearance: Normal appearance.  Cardiovascular:     Rate and Rhythm: Normal rate and regular rhythm.     Pulses: Normal pulses.     Heart sounds: Normal heart sounds.  Pulmonary:     Effort: Pulmonary effort is normal.      Breath sounds: Normal breath sounds.  Neurological:     General: No focal deficit present.     Mental Status: He is alert and oriented to person, place, and time.  Psychiatric:        Mood and Affect: Mood normal.        Behavior: Behavior normal.     LABORATORY DATA:  I have reviewed the labs as listed.     Latest Ref Rng & Units 05/02/2022    9:46 AM 04/18/2022    9:15 AM 04/04/2022    9:57 AM  CBC  WBC 4.0 - 10.5 K/uL 4.7  6.8  C 5.8   Hemoglobin 13.0 - 17.0 g/dL 11.1  11.8  11.4   Hematocrit 39.0 - 52.0 % 33.7  36.1  34.0   Platelets 150 - 400 K/uL 139  177  170     C Corrected result      Latest Ref Rng & Units 05/16/2022   10:25 AM 05/02/2022    9:46 AM 04/18/2022    9:15 AM  CMP  Glucose 70 - 99 mg/dL 131  103  91   BUN 8 - 23 mg/dL _0 Creatinine 0.61 - 1.24 mg/dL 3.73  3.43  3.19   Sodium 135 - 145 mmol/L 140  139  138   Potassium 3.5 - 5.1 mmol/L 3.0  3.9  3.4   Chloride 98 - 111 mmol/L 105  110  105  CO2 22 - 32 mmol/L _0 Calcium 8.9 - 10.3 mg/dL 9.2  9.0  9.4   Total Protein 6.5 - 8.1 g/dL 6.9  6.4  7.3   Total Bilirubin 0.3 - 1.2 mg/dL 0.7  0.4  0.8   Alkaline Phos 38 - 126 U/L 46  44  40   AST 15 - 41 U/L _1 ALT 0 - 44 U/L _2 DIAGNOSTIC IMAGING:  I have independently reviewed the scans and discussed with the patient. No results found.   ASSESSMENT:  1.  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  Basis for CKD is hypertension associated arteriosclerosis with arterionephrosclerosis. - Bone marrow biopsy on 02/21/2021 with slightly hypercellular bone marrow with trilineage hematopoiesis.  Plasma cells increased in number representing 11% of all cells with kappa light chain restriction. - Myeloma FISH panel with no evidence of T p53.  Plasma cell enrichment yielded limited  cellularity, therefore only tested for T p53 probes at. - Chromosome analysis 45, X,- Y (8)/46, XY (12) - Labs on 02/01/2021-M spike 2 g, kappa light chain 794, free light chain ratio 49.97.  Beta-2 microglobulin 5.5, albumin 3.5.  LDH normal. - PET scan on 03/08/2021 with no evidence of active myeloma.  No soft tissue plasmacytoma. - 24-hour urine total protein 2.2 g on 03/13/2021.  Immunofixation positive for IgG kappa. - 4 cycles of CyBorD from 03/14/2021 through 07/04/2021 held due to fluid retention.  Myeloma labs on 08/08/2021 showed progression. - Daratumumab, Velcade and dexamethasone started on 08/30/2021.   2.  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  Smoked 2 packs/day for 32 years and quit smoking cigarettes and vaping now. -Mother had small cell lung cancer.   3.  CKD: -He has stage V CKD thought to be secondary to hypertension. -Renal ultrasound on 12/19/2020 was negative for obstructive uropathy and findings were consistent with chronic medical renal disease.  Bilateral kidney cysts were seen. -He has 2.7 g of proteinuria on 24-hour urine. -Kidney biopsy was done on 01/25/2021.   PLAN:  1.  Stage II, standard risk IgG kappa multiple myeloma: - Reviewed myeloma labs from 05/02/2022: M spike is 0.2 g.  Kappa light chains improved to 37 and ratio 3.94. - Continue Velcade every 2 weeks and Darzalex monthly. - RTC 7 weeks with myeloma labs including DIRA in 6 weeks.   2.  Smoking history: - CT chest on 02/15/2021 was lung RADS 1.   3.  ID prophylaxis: - Continue aspirin 81 mg daily.  Continue acyclovir 200 mg twice daily.   4.  Myeloma bone disease: - Last denosumab on 09/13/2021.  Holding denosumab since dialysis started.  5.  Lower extremity swelling: - Continue furosemide twice daily.  6.  Peripheral neuropathy: - He denies any tingling and numbness in the feet.  7.  ESRD on HD: - Peritoneal dialysis was started on 05/15/2022.    Orders placed this encounter:  No orders of the defined types were placed in this encounter.    Derek Jack, MD [place] [phone]   I, Thana Ates, am acting as a scribe for Dr. Derek Jack.  I, Derek Jack MD, have reviewed the above documentation for accuracy and completeness, and I agree with the above.

## 2022-05-16 NOTE — Progress Notes (Signed)
Patient takes own premeds from home on treatment day.    Patient tolerated Velcade injection with no complaints voiced.  Lab work reviewed.  See MAR for details.  Injection site clean and dry with no bruising or swelling noted.  Patient stable during and after injection.  Band aid applied.  VSS.  Patient left in satisfactory condition with no s/s of distress noted.

## 2022-05-17 ENCOUNTER — Other Ambulatory Visit (HOSPITAL_COMMUNITY): Payer: Self-pay | Admitting: *Deleted

## 2022-05-17 DIAGNOSIS — C9000 Multiple myeloma not having achieved remission: Secondary | ICD-10-CM

## 2022-05-20 ENCOUNTER — Other Ambulatory Visit: Payer: Self-pay | Admitting: Internal Medicine

## 2022-05-21 DIAGNOSIS — N186 End stage renal disease: Secondary | ICD-10-CM | POA: Diagnosis not present

## 2022-05-21 DIAGNOSIS — Z992 Dependence on renal dialysis: Secondary | ICD-10-CM | POA: Diagnosis not present

## 2022-05-21 DIAGNOSIS — Z452 Encounter for adjustment and management of vascular access device: Secondary | ICD-10-CM | POA: Diagnosis not present

## 2022-05-23 DIAGNOSIS — N186 End stage renal disease: Secondary | ICD-10-CM | POA: Diagnosis not present

## 2022-05-23 DIAGNOSIS — Z992 Dependence on renal dialysis: Secondary | ICD-10-CM | POA: Diagnosis not present

## 2022-05-27 ENCOUNTER — Other Ambulatory Visit: Payer: Self-pay

## 2022-05-28 DIAGNOSIS — N186 End stage renal disease: Secondary | ICD-10-CM | POA: Diagnosis not present

## 2022-05-28 DIAGNOSIS — Z992 Dependence on renal dialysis: Secondary | ICD-10-CM | POA: Diagnosis not present

## 2022-05-29 DIAGNOSIS — N186 End stage renal disease: Secondary | ICD-10-CM | POA: Diagnosis not present

## 2022-05-29 DIAGNOSIS — Z992 Dependence on renal dialysis: Secondary | ICD-10-CM | POA: Diagnosis not present

## 2022-05-30 ENCOUNTER — Encounter (HOSPITAL_COMMUNITY): Payer: Self-pay

## 2022-05-30 ENCOUNTER — Inpatient Hospital Stay (HOSPITAL_COMMUNITY): Payer: Medicare HMO

## 2022-05-30 ENCOUNTER — Ambulatory Visit (INDEPENDENT_AMBULATORY_CARE_PROVIDER_SITE_OTHER): Payer: Medicare HMO

## 2022-05-30 ENCOUNTER — Ambulatory Visit: Payer: Medicare HMO

## 2022-05-30 VITALS — BP 115/81 | HR 97 | Temp 98.7°F | Resp 20 | Wt 257.9 lb

## 2022-05-30 DIAGNOSIS — Z79899 Other long term (current) drug therapy: Secondary | ICD-10-CM | POA: Diagnosis not present

## 2022-05-30 DIAGNOSIS — C9000 Multiple myeloma not having achieved remission: Secondary | ICD-10-CM | POA: Diagnosis not present

## 2022-05-30 DIAGNOSIS — Z5112 Encounter for antineoplastic immunotherapy: Secondary | ICD-10-CM | POA: Diagnosis not present

## 2022-05-30 DIAGNOSIS — Z992 Dependence on renal dialysis: Secondary | ICD-10-CM | POA: Diagnosis not present

## 2022-05-30 DIAGNOSIS — Z0000 Encounter for general adult medical examination without abnormal findings: Secondary | ICD-10-CM

## 2022-05-30 DIAGNOSIS — N186 End stage renal disease: Secondary | ICD-10-CM | POA: Diagnosis not present

## 2022-05-30 DIAGNOSIS — D472 Monoclonal gammopathy: Secondary | ICD-10-CM

## 2022-05-30 LAB — COMPREHENSIVE METABOLIC PANEL
ALT: 22 U/L (ref 0–44)
AST: 16 U/L (ref 15–41)
Albumin: 3.9 g/dL (ref 3.5–5.0)
Alkaline Phosphatase: 48 U/L (ref 38–126)
Anion gap: 9 (ref 5–15)
BUN: 23 mg/dL (ref 8–23)
CO2: 26 mmol/L (ref 22–32)
Calcium: 9.3 mg/dL (ref 8.9–10.3)
Chloride: 103 mmol/L (ref 98–111)
Creatinine, Ser: 3.75 mg/dL — ABNORMAL HIGH (ref 0.61–1.24)
GFR, Estimated: 17 mL/min — ABNORMAL LOW (ref >60)
Glucose, Bld: 113 mg/dL — ABNORMAL HIGH (ref 70–99)
Potassium: 3.5 mmol/L (ref 3.5–5.1)
Sodium: 138 mmol/L (ref 135–145)
Total Bilirubin: 0.7 mg/dL (ref 0.3–1.2)
Total Protein: 6.8 g/dL (ref 6.5–8.1)

## 2022-05-30 LAB — CBC WITH DIFFERENTIAL/PLATELET
Abs Immature Granulocytes: 0.01 10*3/uL (ref 0.00–0.07)
Basophils Absolute: 0 10*3/uL (ref 0.0–0.1)
Basophils Relative: 0 %
Eosinophils Absolute: 0.1 10*3/uL (ref 0.0–0.5)
Eosinophils Relative: 2 %
HCT: 36.7 % — ABNORMAL LOW (ref 39.0–52.0)
Hemoglobin: 12.3 g/dL — ABNORMAL LOW (ref 13.0–17.0)
Immature Granulocytes: 0 %
Lymphocytes Relative: 49 %
Lymphs Abs: 3.1 10*3/uL (ref 0.7–4.0)
MCH: 33.2 pg (ref 26.0–34.0)
MCHC: 33.5 g/dL (ref 30.0–36.0)
MCV: 99.2 fL (ref 80.0–100.0)
Monocytes Absolute: 0.4 10*3/uL (ref 0.1–1.0)
Monocytes Relative: 6 %
Neutro Abs: 2.8 10*3/uL (ref 1.7–7.7)
Neutrophils Relative %: 43 %
Platelets: 128 10*3/uL — ABNORMAL LOW (ref 150–400)
RBC: 3.7 MIL/uL — ABNORMAL LOW (ref 4.22–5.81)
RDW: 16.5 % — ABNORMAL HIGH (ref 11.5–15.5)
WBC: 6.4 10*3/uL (ref 4.0–10.5)
nRBC: 0 % (ref 0.0–0.2)

## 2022-05-30 LAB — MAGNESIUM: Magnesium: 2.1 mg/dL (ref 1.7–2.4)

## 2022-05-30 MED ORDER — BORTEZOMIB CHEMO SQ INJECTION 3.5 MG (2.5MG/ML)
1.0000 mg/m2 | Freq: Once | INTRAMUSCULAR | Status: AC
  Administered 2022-05-30: 2.25 mg via SUBCUTANEOUS
  Filled 2022-05-30: qty 0.9

## 2022-05-30 MED ORDER — PROCHLORPERAZINE MALEATE 10 MG PO TABS: 10.0000 mg | ORAL_TABLET | Freq: Once | ORAL | Status: DC

## 2022-05-30 NOTE — Patient Instructions (Signed)
Ernest Barnett  Discharge Instructions: Thank you for choosing [facility] to provide your oncology and hematology care.  If you have a lab appointment with the [facility], please come in thru the Main Entrance and check in at the main information desk.  Wear comfortable clothing and clothing appropriate for easy access to any Portacath or PICC line.   We strive to give you quality time with your provider. You may need to reschedule your appointment if you arrive late (15 or more minutes).  Arriving late affects you and other patients whose appointments are after yours.  Also, if you miss three or more appointments without notifying the office, you may be dismissed from the clinic at the provider's discretion.      For prescription refill requests, have your pharmacy contact our office and allow 72 hours for refills to be completed.    Today you received the following chemotherapy and/or immunotherapy agents Velcade.  Bortezomib injection What is this medication? BORTEZOMIB (bor TEZ oh mib) targets proteins in cancer cells and stops the cancer cells from growing. It treats multiple myeloma and mantle cell lymphoma. This medicine may be used for other purposes; ask your health care provider or pharmacist if you have questions. COMMON BRAND NAME(S): Velcade What should I tell my care team before I take this medication? They need to know if you have any of these conditions: dehydration diabetes (high blood sugar) heart disease liver disease tingling of the fingers or toes or other nerve disorder an unusual or allergic reaction to bortezomib, mannitol, boron, other medicines, foods, dyes, or preservatives pregnant or trying to get pregnant breast-feeding How should I use this medication? This medicine is injected into a vein or under the skin. It is given by a health care provider in a hospital or clinic setting. Talk to your health care provider about the use of  this medicine in children. Special care may be needed. Overdosage: If you think you have taken too much of this medicine contact a poison control center or emergency room at once. NOTE: This medicine is only for you. Do not share this medicine with others. What if I miss a dose? Keep appointments for follow-up doses. It is important not to miss your dose. Call your health care provider if you are unable to keep an appointment. What may interact with this medication? This medicine may interact with the following medications: ketoconazole rifampin This list may not describe all possible interactions. Give your health care provider a list of all the medicines, herbs, non-prescription drugs, or dietary supplements you use. Also tell them if you smoke, drink alcohol, or use illegal drugs. Some items may interact with your medicine. What should I watch for while using this medication? Your condition will be monitored carefully while you are receiving this medicine. You may need blood work done while you are taking this medicine. You may get drowsy or dizzy. Do not drive, use machinery, or do anything that needs mental alertness until you know how this medicine affects you. Do not stand up or sit up quickly, especially if you are an older patient. This reduces the risk of dizzy or fainting spells This medicine may increase your risk of getting an infection. Call your health care provider for advice if you get a fever, chills, sore throat, or other symptoms of a cold or flu. Do not treat yourself. Try to avoid being around people who are sick. Check with your health care provider  if you have severe diarrhea, nausea, and vomiting, or if you sweat a lot. The loss of too much body fluid may make it dangerous for you to take this medicine. Do not become pregnant while taking this medicine or for 7 months after stopping it. Women should inform their health care provider if they wish to become pregnant or think  they might be pregnant. Men should not father a child while taking this medicine and for 4 months after stopping it. There is a potential for serious harm to an unborn child. Talk to your health care provider for more information. Do not breast-feed an infant while taking this medicine or for 2 months after stopping it. This medicine may make it more difficult to get pregnant or father a child. Talk to your health care provider if you are concerned about your fertility. What side effects may I notice from receiving this medication? Side effects that you should report to your doctor or health care professional as soon as possible: allergic reactions (skin rash; itching or hives; swelling of the face, lips, or tongue) bleeding (bloody or black, tarry stools; red or dark Ernest Barnett urine; spitting up blood or Ernest Barnett material that looks like coffee grounds; red spots on the skin; unusual bruising or bleeding from the eye, gums, or nose) blurred vision or changes in vision confusion constipation headache heart failure (trouble breathing; fast, irregular heartbeat; sudden weight gain; swelling of the ankles, feet, hands) infection (fever, chills, cough, sore throat, pain or trouble passing urine) lack or loss of appetite liver injury (dark yellow or Ernest Barnett urine; general ill feeling or flu-like symptoms; loss of appetite, right upper belly pain; yellowing of the eyes or skin) low blood pressure (dizziness; feeling faint or lightheaded, Barnett; unusually weak or tired) muscle cramps pain, redness, or irritation at site where injected pain, tingling, numbness in the hands or feet seizures trouble breathing unusual bruising or bleeding Side effects that usually do not require medical attention (report to your doctor or health care professional if they continue or are bothersome): diarrhea nausea stomach pain trouble sleeping vomiting This list may not describe all possible side effects. Call your doctor  for medical advice about side effects. You may report side effects to FDA at 1-800-FDA-1088. Where should I keep my medication? This medicine is given in a hospital or clinic. It will not be stored at home. NOTE: This sheet is a summary. It may not cover all possible information. If you have questions about this medicine, talk to your doctor, pharmacist, or health care provider.  2023 [employer]vier/Gold Standard (2020-10-12 00:00:00)   To help prevent nausea and vomiting after your treatment, we encourage you to take your nausea medication as directed.  BELOW ARE SYMPTOMS THAT SHOULD BE REPORTED IMMEDIATELY: *FEVER GREATER THAN 100.4 F (38 C) OR HIGHER *CHILLS OR SWEATING *NAUSEA AND VOMITING THAT IS NOT CONTROLLED WITH YOUR NAUSEA MEDICATION *UNUSUAL SHORTNESS OF BREATH *UNUSUAL BRUISING OR BLEEDING *URINARY PROBLEMS (pain or burning when urinating, or frequent urination) *BOWEL PROBLEMS (unusual diarrhea, constipation, pain near the anus) TENDERNESS IN MOUTH AND THROAT WITH OR WITHOUT PRESENCE OF ULCERS (sore throat, sores in mouth, or a toothache) UNUSUAL RASH, SWELLING OR PAIN  UNUSUAL VAGINAL DISCHARGE OR ITCHING   Items with * indicate a potential emergency and should be followed up as soon as possible or go to the Emergency Department if any problems should occur.  Please show the CHEMOTHERAPY ALERT CARD or IMMUNOTHERAPY ALERT CARD at check-in to the Emergency Department  and triage nurse.  Should you have questions after your visit or need to cancel or reschedule your appointment, please contact [facility] [phone]  and follow the prompts.  Office hours are 8:00 a.m. to 4:30 p.m. Monday - Friday. Please note that voicemails left after 4:00 p.m. may not be returned until the following business day.  We are closed weekends and major holidays. You have access to a nurse at all times for urgent questions. Please call the main number to the clinic [phone] and follow the  prompts.  For any non-urgent questions, you may also contact your provider using MyChart. We now offer e-Visits for anyone 89 and older to request care online for non-urgent symptoms. For details visit [email or web address].   Also download the MyChart app! Go to the app store, search "MyChart", open the app, select [facility], and log in with your MyChart username and password.  Masks are optional in the cancer centers. If you would like for your care team to wear a mask while they are taking care of you, please let them know. For doctor visits, patients may have with them one support person who is at least 65 years old. At this time, visitors are not allowed in the infusion area.

## 2022-05-30 NOTE — Progress Notes (Signed)
Subjective:   Ernest Barnett is a 66 y.o. male who presents for Medicare Annual/Subsequent preventive examination.  I connected with  Ernest Barnett on 05/30/22 by a audio enabled telemedicine application and verified that I am speaking with the correct person using two identifiers.  Patient Location: Home  Provider Location: Office/Clinic  I discussed the limitations of evaluation and management by telemedicine. The patient expressed understanding and agreed to proceed.   Review of Systems     Ernest Barnett , Thank you for taking time to come for your Medicare Wellness Visit. I appreciate your ongoing commitment to your health goals. Please review the following plan we discussed and let me know if I can assist you in the future.   These are the goals we discussed:  Goals      Increase physical activity     Would like to keep going and move more this year         This is a list of the screening recommended for you and due dates:  Health Maintenance  Topic Date Due   COVID-19 Vaccine (1) Never done   Pneumonia Vaccine (1 - PCV) Never done   Tetanus Vaccine  Never done   Zoster (Shingles) Vaccine (1 of 2) Never done   Flu Shot  06/04/2022   Hepatitis C Screening: USPSTF Recommendation to screen - Ages 71-79 yo.  Completed   HIV Screening  Completed   HPV Vaccine  Aged Out   Colon Cancer Screening  Discontinued          Objective:    There were no vitals filed for this visit. There is no height or weight on file to calculate BMI.     05/30/2022   11:48 AM 05/16/2022   11:24 AM 04/18/2022   11:48 AM 04/04/2022   10:41 AM 03/28/2022   12:15 PM 03/28/2022   10:45 AM 03/07/2022    9:53 AM  Advanced Directives  Does Patient Have a Medical Advance Directive? No No No No No No No  Would patient like information on creating a medical advance directive? No - Patient declined No - Patient declined No - Patient declined No - Patient declined No - Patient declined No - Patient  declined No - Patient declined    Current Medications (verified) Outpatient Encounter Medications as of 05/30/2022  Medication Sig   acyclovir (ZOVIRAX) 200 MG capsule Take 1 capsule (200 mg total) by mouth 2 (two) times daily.   albuterol (VENTOLIN HFA) 108 (90 Base) MCG/ACT inhaler Inhale 2 puffs into the lungs every 6 (six) hours as needed for wheezing or shortness of breath.   aspirin EC 81 MG tablet Take 1 tablet (81 mg total) by mouth daily.   bortezomib IV (VELCADE) 3.5 MG injection Inject 1.5 mg/m2 into the vein once a week.   budesonide-formoterol (SYMBICORT) 80-4.5 MCG/ACT inhaler Inhale 2 puffs into the lungs 2 (two) times daily as needed (shortness of breath).   dexamethasone (DECADRON) 4 MG tablet Take 2.5 tablets (10 mg total) by mouth once a week. Take at home along with Tylenol and Benadryl weekly on the day of daratumumab injection.   docusate sodium (COLACE) 100 MG capsule Take 100 mg by mouth daily.   gabapentin (NEURONTIN) 300 MG capsule Take 1 capsule by mouth twice daily   HYDROcodone-acetaminophen (NORCO/VICODIN) 5-325 MG tablet Take by mouth.   midodrine (PROAMATINE) 10 MG tablet Take by mouth.   multivitamin (RENA-VIT) TABS tablet Take 1 tablet by mouth  daily.   Omega-3 Fatty Acids (FISH OIL) 1000 MG CAPS Take 1,000 mg by mouth in the morning, at noon, and at bedtime.    oxyCODONE (OXY IR/ROXICODONE) 5 MG immediate release tablet Take 1 tablet (5 mg total) by mouth 2 (two) times daily as needed for severe pain.   pantoprazole (PROTONIX) 40 MG tablet Take 1 tablet (40 mg total) by mouth daily.   polyethylene glycol (MIRALAX / GLYCOLAX) packet Take 17 g by mouth daily. Patient states that he takes as needed.   pravastatin (PRAVACHOL) 80 MG tablet Take 1 tablet (80 mg total) by mouth daily.   sildenafil (REVATIO) 20 MG tablet Take 1 tablet (20 mg total) by mouth 5 (five) times daily as needed.   sodium bicarbonate 325 MG tablet Take 325 mg by mouth 3 (three) times daily.    tamsulosin (FLOMAX) 0.4 MG CAPS capsule Take 1 capsule by mouth once daily   tiZANidine (ZANAFLEX) 2 MG tablet TAKE 1 TABLET BY MOUTH EVERY 6 HOURS AS NEEDED FOR MUSCLE SPASM   torsemide (DEMADEX) 100 MG tablet Take 100 mg by mouth 2 (two) times daily.   Facility-Administered Encounter Medications as of 05/30/2022  Medication   [COMPLETED] bortezomib SQ (VELCADE) chemo injection (2.'5mg'$ /mL concentration) 2.25 mg   palonosetron (ALOXI) 0.25 MG/5ML injection   [DISCONTINUED] prochlorperazine (COMPAZINE) tablet 10 mg    Allergies (verified) Patient has no known allergies.   History: Past Medical History:  Diagnosis Date   Chronic kidney disease    COPD (chronic obstructive pulmonary disease) (HCC)    CVA (cerebral vascular accident) ([place]) 2013   Cystoid macular edema of left eye 03/07/2021   GERD (gastroesophageal reflux disease)    HOH (hard of hearing)    Hypercholesteremia    Hypertension    Left epiretinal membrane 03/07/2021   Pseudophakia 03/07/2021   Stroke ([place]) 03/24/2012   left sided weakness   Vitamin D deficiency    Past Surgical History:  Procedure Laterality Date   A/V FISTULAGRAM Right 01/29/2022   Procedure: A/V Fistulagram;  Surgeon: Serafina Mitchell, MD;  Location: [facility];  Service: Cardiovascular;  Laterality: Right;   AV FISTULA PLACEMENT Right 02/08/2021   Procedure: RIGHT ARM ARTERIOVENOUS (AV) FISTULA CREATION;  Surgeon: [name], MD;  Location: [facility];  Service: Vascular;  Laterality: Right;   BIOPSY  11/12/2018   Procedure: BIOPSY;  Surgeon: Rogene Houston, MD;  [facility];  Service: Endoscopy;;  colon   BIOPSY  05/10/2020   Procedure: BIOPSY;  Surgeon: Rogene Houston, MD;  [facility];  Service: Endoscopy;;  esophagus   CATARACT EXTRACTION     right eye   CATARACT EXTRACTION W/PHACO  10/08/2012   Procedure: CATARACT EXTRACTION PHACO AND INTRAOCULAR LENS PLACEMENT ([place]);  Surgeon: Tonny Branch, MD;  Location: [facility];   Service: Ophthalmology;  Laterality: Left;  CDE:6.64   CHOLECYSTECTOMY     [place]   COLONOSCOPY N/A 11/12/2018   Procedure: COLONOSCOPY;  Surgeon: Rogene Houston, MD;  [facility];  Service: Endoscopy;  Laterality: N/A;  1030   ELBOW FRACTURE SURGERY     left   ESOPHAGEAL DILATION N/A 11/12/2018   Procedure: ESOPHAGEAL DILATION;  Surgeon: Rogene Houston, MD;  [facility];  Service: Endoscopy;  Laterality: N/A;   ESOPHAGEAL DILATION N/A 05/10/2020   Procedure: ESOPHAGEAL DILATION;  Surgeon: Rogene Houston, MD;  [facility];  Service: Endoscopy;  Laterality: N/A;  ESOPHAGOGASTRODUODENOSCOPY N/A 11/12/2018   Procedure: ESOPHAGOGASTRODUODENOSCOPY (EGD);  Surgeon: Rogene Houston, MD;  [facility];  Service: Endoscopy;  Laterality: N/A;   ESOPHAGOGASTRODUODENOSCOPY N/A 05/10/2020   Procedure: ESOPHAGOGASTRODUODENOSCOPY (EGD);  Surgeon: Rogene Houston, MD;  [facility];  Service: Endoscopy;  Laterality: N/A;  210   HERNIA REPAIR     right inguinal   HYDROCELE EXCISION / REPAIR     IR FLUORO GUIDE CV LINE LEFT  08/17/2021   IR US GUIDE VASC ACCESS LEFT  08/17/2021   LIGATION OF COMPETING BRANCHES OF ARTERIOVENOUS FISTULA Right 09/11/2021   Procedure: LIGATION OF COMPETING BRANCHES OF RIGHT ARM ARTERIOVENOUS FISTULA;  Surgeon: [name], MD;  Location: [facility];  Service: Vascular;  Laterality: Right;   PERIPHERAL VASCULAR BALLOON ANGIOPLASTY Right 01/29/2022   Procedure: PERIPHERAL VASCULAR BALLOON ANGIOPLASTY;  Surgeon: Serafina Mitchell, MD;  Location: [facility];  Service: Cardiovascular;  Laterality: Right;  arm fistula   POLYPECTOMY  11/12/2018   Procedure: POLYPECTOMY;  Surgeon: Rogene Houston, MD;  [facility];  Service: Endoscopy;;  colon    RETINAL DETACHMENT SURGERY Left 2019   SPLENECTOMY, TOTAL     TONSILLECTOMY     VOCAL CORD INJECTION     removal of polyp-2005   No family history on file. 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  Tobacco Use   Smoking status: Former    Packs/day: 3.00    Years: 30.00    Total pack years: 90.00    Types: Cigarettes   Smokeless tobacco: Current   Tobacco comments:    Currently vape  Vaping Use   Vaping Use: Every day   Substances: Nicotine  Substance and Sexual Activity   Alcohol use: Yes    Alcohol/week: 1.0 - 2.0 standard drink of alcohol    Types: 1 - 2 Cans of beer per week    Comment: Occasionally   Drug use: No   Sexual activity: Not Currently    Birth control/protection: Abstinence, None  Other Topics Concern   Not on file  Social History Narrative   2 children, both nearby   Social Determinants of Health   Financial Resource Strain: Low Risk  (05/29/2021)   Overall Financial Resource Strain (CARDIA)    Difficulty of Paying Living Expenses: Not hard at all  Food Insecurity: No Food Insecurity (05/29/2021)   Hunger Vital Sign    Worried About Running Out of Food in the Last Year: Never true    Ran Out of Food in the Last Year: Never true  Transportation Needs: No Transportation Needs (05/29/2021)   PRAPARE - Hydrologist (Medical): No    Lack of Transportation (Non-Medical): No  Physical Activity: Insufficiently Active (05/29/2021)   Exercise Vital Sign    Days of Exercise per Week: 3 days    Minutes of Exercise per Session: 30 min  Stress: No Stress Concern Present (05/29/2021)   [place]    Feeling of Stress : Only a little  Social Connections: Socially Isolated (05/29/2021)   Social Connection and Isolation Panel [NHANES]  Frequency of Communication with Friends and  Family: More than three times a week    Frequency of Social Gatherings with Friends and Family: More than three times a week    Attends Religious Services: Never    Marine scientist or Organizations: No    Attends Archivist Meetings: Never    Marital Status: Divorced    Tobacco Counseling Ready to quit: Not Answered Counseling given: Not Answered Tobacco comments: Currently vape   Clinical Intake:                 Diabetic? No          Activities of Daily Living    09/07/2021    9:55 AM 09/07/2021    9:53 AM  In your present state of health, do you have any difficulty performing the following activities:  Hearing?  1  Vision?  0  Difficulty concentrating or making decisions?  0  Walking or climbing stairs?  0  Dressing or bathing?  0  Doing errands, shopping? 0     Patient Care Team: Lindell Spar, MD as PCP - General (Internal Medicine) Harl Bowie Alphonse Guild, MD as PCP - Cardiology (Cardiology) Brien Mates, RN as Oncology Nurse Navigator (Oncology) Derek Jack, MD as Medical Oncologist (Medical Oncology)  Indicate any recent Medical Services you may have received from other than [facility] providers in the past year (date may be approximate).     Assessment:   This is a routine wellness examination for [place].  Hearing/Vision screen No results found.  Dietary issues and exercise activities discussed:     Goals Addressed   None   Depression Screen    05/09/2022   11:25 AM 10/23/2021    9:13 AM 06/25/2021    8:34 AM 05/29/2021    8:52 AM 05/29/2021    8:48 AM 05/22/2021    8:14 AM 02/01/2021    8:37 AM  PHQ 2/9 Scores  PHQ - 2 Score 0 0 0 0 0 0 0    Fall Risk    05/09/2022   11:24 AM 10/23/2021    9:13 AM 06/25/2021    8:34 AM 05/29/2021    8:51 AM 05/22/2021    8:14 AM  Fall Risk   Falls in the past year? 0 0 0 0 0  Number falls in past yr: 0 0 0 0 0  Injury with Fall? 0 0 0 0 0  Risk for fall due to : No Fall  Risks No Fall Risks No Fall Risks No Fall Risks No Fall Risks  Follow up Falls evaluation completed Falls evaluation completed Falls evaluation completed Falls evaluation completed Falls evaluation completed    FALL RISK PREVENTION PERTAINING TO THE HOME:  Any stairs in or around the home? Yes  If so, are there any without handrails? No  Home free of loose throw rugs in walkways, pet beds, electrical cords, etc? Yes  Adequate lighting in your home to reduce risk of falls? Yes   ASSISTIVE DEVICES UTILIZED TO PREVENT FALLS:  Life alert? No  Use of a cane, walker or w/c? No  Grab bars in the bathroom? No  Shower chair or bench in shower? No  Elevated toilet seat or a handicapped toilet? No    Cognitive Function:    05/29/2021    8:53 AM  MMSE - Mini Mental State Exam  Not completed: Unable to complete        05/29/2021    8:53  AM  6CIT Screen  What Year? 0 points  What month? 0 points  What time? 0 points  Count back from 20 0 points  Months in reverse 0 points  Repeat phrase 0 points  Total Score 0 points    Immunizations  There is no immunization history on file for this patient.  TDAP status: Up to date  Flu Vaccine status: Up to date  Pneumococcal vaccine status: Due, Education has been provided regarding the importance of this vaccine. Advised may receive this vaccine at local pharmacy or Health Dept. Aware to provide a copy of the vaccination record if obtained from local pharmacy or Health Dept. Verbalized acceptance and understanding.  Covid-19 vaccine status: Declined, Education has been provided regarding the importance of this vaccine but patient still declined. Advised may receive this vaccine at local pharmacy or Health Dept.or vaccine clinic. Aware to provide a copy of the vaccination record if obtained from local pharmacy or Health Dept. Verbalized acceptance and understanding.  Qualifies for Shingles Vaccine? No   Zostavax completed No   Shingrix  Completed?: No.    Education has been provided regarding the importance of this vaccine. Patient has been advised to call insurance company to determine out of pocket expense if they have not yet received this vaccine. Advised may also receive vaccine at local pharmacy or Health Dept. Verbalized acceptance and understanding.  Screening Tests Health Maintenance  Topic Date Due   COVID-19 Vaccine (1) Never done   Pneumonia Vaccine 59+ Years old (1 - PCV) Never done   TETANUS/TDAP  Never done   Zoster Vaccines- Shingrix (1 of 2) Never done   INFLUENZA VACCINE  06/04/2022   Hepatitis C Screening  Completed   HIV Screening  Completed   HPV VACCINES  Aged Out   COLONOSCOPY (Pts 45-36yr Insurance coverage will need to be confirmed)  Discontinued    Health Maintenance  Health Maintenance Due  Topic Date Due   COVID-19 Vaccine (1) Never done   Pneumonia Vaccine 65 Years old (1 - PCV) Never done   TETANUS/TDAP  Never done   Zoster Vaccines- Shingrix (1 of 2) Never done    Colorectal cancer screening: Type of screening: Colonoscopy. Completed 11/12/2018. Repeat every 10 years  Lung Cancer Screening: (Low Dose CT Chest recommended if Age 65-80years, 30 pack-year currently smoking OR have quit w/in 15years.) does not qualify.    Additional Screening:  Hepatitis C Screening: does qualify; Completed 06/20/2021  Vision Screening: Recommended annual ophthalmology exams for early detection of glaucoma and other disorders of the eye. Is the patient up to date with their annual eye exam?  Yes  Who is the provider or what is the name of the office in which the patient attends annual eye exams? [facility]    Dental Screening: Recommended annual dental exams for proper oral hygiene  Community Resource Referral / Chronic Care Management: CRR required this visit?  No   CCM required this visit?  No      Plan:     I have personally reviewed and noted the following in the patient's chart:    Medical and social history Use of alcohol, tobacco or illicit drugs  Current medications and supplements including opioid prescriptions. Patient is currently taking opioid prescriptions. Information provided to patient regarding non-opioid alternatives. Patient advised to discuss non-opioid treatment plan with their provider. Functional ability and status Nutritional status Physical activity Advanced directives List of other physicians Hospitalizations, surgeries, and ER visits in  previous 12 months Vitals Screenings to include cognitive, depression, and falls Referrals and appointments  In addition, I have reviewed and discussed with patient certain preventive protocols, quality metrics, and best practice recommendations. A written personalized care plan for preventive services as well as general preventive health recommendations were provided to patient.     Johny Drilling, [place]   05/30/2022   Nurse Notes:  Mr. Sayre , Thank you for taking time to come for your Medicare Wellness Visit. I appreciate your ongoing commitment to your health goals. Please review the following plan we discussed and let me know if I can assist you in the future.   These are the goals we discussed:  Goals      Increase physical activity     Would like to keep going and move more this year         This is a list of the screening recommended for you and due dates:  Health Maintenance  Topic Date Due   COVID-19 Vaccine (1) Never done   Pneumonia Vaccine (1 - PCV) Never done   Tetanus Vaccine  Never done   Zoster (Shingles) Vaccine (1 of 2) Never done   Flu Shot  06/04/2022   Hepatitis C Screening: USPSTF Recommendation to screen - Ages 77-79 yo.  Completed   HIV Screening  Completed   HPV Vaccine  Aged Out   Colon Cancer Screening  Discontinued

## 2022-05-30 NOTE — Patient Instructions (Signed)
Ernest Barnett , Thank you for taking time to come for your Medicare Wellness Visit. I appreciate your ongoing commitment to your health goals. Please review the following plan we discussed and let me know if I can assist you in the future.   Screening recommendations/referrals: Colonoscopy: Completed Recommended yearly ophthalmology/optometry visit for glaucoma screening and checkup Recommended yearly dental visit for hygiene and checkup  Vaccinations: Influenza vaccine: Completed Pneumococcal vaccine: Get at pharmacy Tdap vaccine: Completed Shingles vaccine: Talk to Dr. Posey Pronto about getting it     Advanced directives: patient declined  Conditions/risks identified: hypertension, falls  Next appointment: 1 year  Preventive Care 27 Years and Older, Male Preventive care refers to lifestyle choices and visits with your health care provider that can promote health and wellness. What does preventive care include? A yearly physical exam. This is also called an annual well check. Dental exams once or twice a year. Routine eye exams. Ask your health care provider how often you should have your eyes checked. Personal lifestyle choices, including: Daily care of your teeth and gums. Regular physical activity. Eating a healthy diet. Avoiding tobacco and drug use. Limiting alcohol use. Practicing safe sex. Taking low doses of aspirin every day. Taking vitamin and mineral supplements as recommended by your health care provider. What happens during an annual well check? The services and screenings done by your health care provider during your annual well check will depend on your age, overall health, lifestyle risk factors, and family history of disease. Counseling  Your health care provider may ask you questions about your: Alcohol use. Tobacco use. Drug use. Emotional well-being. Home and relationship well-being. Sexual activity. Eating habits. History of falls. Memory and ability to  understand (cognition). Work and work Statistician. Screening  You may have the following tests or measurements: Height, weight, and BMI. Blood pressure. Lipid and cholesterol levels. These may be checked every 5 years, or more frequently if you are over 43 years old. Skin check. Lung cancer screening. You may have this screening every year starting at age 39 if you have a 30-pack-year history of smoking and currently smoke or have quit within the past 15 years. Fecal occult blood test (FOBT) of the stool. You may have this test every year starting at age 53. Flexible sigmoidoscopy or colonoscopy. You may have a sigmoidoscopy every 5 years or a colonoscopy every 10 years starting at age 12. Prostate cancer screening. Recommendations will vary depending on your family history and other risks. Hepatitis C blood test. Hepatitis B blood test. Sexually transmitted disease (STD) testing. Diabetes screening. This is done by checking your blood sugar (glucose) after you have not eaten for a while (fasting). You may have this done every 1-3 years. Abdominal aortic aneurysm (AAA) screening. You may need this if you are a current or former smoker. Osteoporosis. You may be screened starting at age 110 if you are at high risk. Talk with your health care provider about your test results, treatment options, and if necessary, the need for more tests. Vaccines  Your health care provider may recommend certain vaccines, such as: Influenza vaccine. This is recommended every year. Tetanus, diphtheria, and acellular pertussis (Tdap, Td) vaccine. You may need a Td booster every 10 years. Zoster vaccine. You may need this after age 76. Pneumococcal 13-valent conjugate (PCV13) vaccine. One dose is recommended after age 24. Pneumococcal polysaccharide (PPSV23) vaccine. One dose is recommended after age 25. Talk to your health care provider about which screenings and vaccines you  need and how often you need them. This  information is not intended to replace advice given to you by your health care provider. Make sure you discuss any questions you have with your health care provider. Document Released: 11/17/2015 Document Revised: 07/10/2016 Document Reviewed: 08/22/2015 Elsevier Interactive Patient Education  2017 [place] Prevention in the Home Falls can cause injuries. They can happen to people of all ages. There are many things you can do to make your home safe and to help prevent falls. What can I do on the outside of my home? Regularly fix the edges of walkways and driveways and fix any cracks. Remove anything that might make you trip as you walk through a door, such as a raised step or threshold. Trim any bushes or trees on the path to your home. Use bright outdoor lighting. Clear any walking paths of anything that might make someone trip, such as rocks or tools. Regularly check to see if handrails are loose or broken. Make sure that both sides of any steps have handrails. Any raised decks and porches should have guardrails on the edges. Have any leaves, snow, or ice cleared regularly. Use sand or salt on walking paths during winter. Clean up any spills in your garage right away. This includes oil or grease spills. What can I do in the bathroom? Use night lights. Install grab bars by the toilet and in the tub and shower. Do not use towel bars as grab bars. Use non-skid mats or decals in the tub or shower. If you need to sit down in the shower, use a plastic, non-slip stool. Keep the floor dry. Clean up any water that spills on the floor as soon as it happens. Remove soap buildup in the tub or shower regularly. Attach bath mats securely with double-sided non-slip rug tape. Do not have throw rugs and other things on the floor that can make you trip. What can I do in the bedroom? Use night lights. Make sure that you have a light by your bed that is easy to reach. Do not use any sheets or  blankets that are too big for your bed. They should not hang down onto the floor. Have a firm chair that has side arms. You can use this for support while you get dressed. Do not have throw rugs and other things on the floor that can make you trip. What can I do in the kitchen? Clean up any spills right away. Avoid walking on wet floors. Keep items that you use a lot in easy-to-reach places. If you need to reach something above you, use a strong step stool that has a grab bar. Keep electrical cords out of the way. Do not use floor polish or wax that makes floors slippery. If you must use wax, use non-skid floor wax. Do not have throw rugs and other things on the floor that can make you trip. What can I do with my stairs? Do not leave any items on the stairs. Make sure that there are handrails on both sides of the stairs and use them. Fix handrails that are broken or loose. Make sure that handrails are as long as the stairways. Check any carpeting to make sure that it is firmly attached to the stairs. Fix any carpet that is loose or worn. Avoid having throw rugs at the top or bottom of the stairs. If you do have throw rugs, attach them to the floor with carpet tape. Make sure that  you have a light switch at the top of the stairs and the bottom of the stairs. If you do not have them, ask someone to add them for you. What else can I do to help prevent falls? Wear shoes that: Do not have high heels. Have rubber bottoms. Are comfortable and fit you well. Are closed at the toe. Do not wear sandals. If you use a stepladder: Make sure that it is fully opened. Do not climb a closed stepladder. Make sure that both sides of the stepladder are locked into place. Ask someone to hold it for you, if possible. Clearly mark and make sure that you can see: Any grab bars or handrails. First and last steps. Where the edge of each step is. Use tools that help you move around (mobility aids) if they are  needed. These include: Canes. Walkers. Scooters. Crutches. Turn on the lights when you go into a dark area. Replace any light bulbs as soon as they burn out. Set up your furniture so you have a clear path. Avoid moving your furniture around. If any of your floors are uneven, fix them. If there are any pets around you, be aware of where they are. Review your medicines with your doctor. Some medicines can make you feel dizzy. This can increase your chance of falling. Ask your doctor what other things that you can do to help prevent falls. This information is not intended to replace advice given to you by your health care provider. Make sure you discuss any questions you have with your health care provider. Document Released: 08/17/2009 Document Revised: 03/28/2016 Document Reviewed: 11/25/2014 Elsevier Interactive Patient Education  2017 [employer].

## 2022-05-30 NOTE — Progress Notes (Signed)
Patient tolerated Velcade injection with no complaints voiced.  Site clean and dry with no bruising or swelling noted.  No complaints of pain.  Discharged with vital signs stable and no signs or symptoms of distress noted.

## 2022-05-30 NOTE — Progress Notes (Signed)
Patient presents today for D15 velcade injection. Vital signs stable and within parameters for treatment. MAR reviewed. Labs within parameters for treatment. Patient declined Compazine PO . Treatment conditions ignore creatinine per Dr. Delton Coombes. Patient on dialysis.

## 2022-05-31 ENCOUNTER — Other Ambulatory Visit: Payer: Self-pay

## 2022-05-31 DIAGNOSIS — Z992 Dependence on renal dialysis: Secondary | ICD-10-CM | POA: Diagnosis not present

## 2022-05-31 DIAGNOSIS — N186 End stage renal disease: Secondary | ICD-10-CM | POA: Diagnosis not present

## 2022-06-02 DIAGNOSIS — N186 End stage renal disease: Secondary | ICD-10-CM | POA: Diagnosis not present

## 2022-06-02 DIAGNOSIS — Z992 Dependence on renal dialysis: Secondary | ICD-10-CM | POA: Diagnosis not present

## 2022-06-03 DIAGNOSIS — N186 End stage renal disease: Secondary | ICD-10-CM | POA: Diagnosis not present

## 2022-06-03 DIAGNOSIS — Z992 Dependence on renal dialysis: Secondary | ICD-10-CM | POA: Diagnosis not present

## 2022-06-04 ENCOUNTER — Other Ambulatory Visit: Payer: Self-pay

## 2022-06-04 DIAGNOSIS — N186 End stage renal disease: Secondary | ICD-10-CM | POA: Diagnosis not present

## 2022-06-04 DIAGNOSIS — Z992 Dependence on renal dialysis: Secondary | ICD-10-CM | POA: Diagnosis not present

## 2022-06-05 DIAGNOSIS — N186 End stage renal disease: Secondary | ICD-10-CM | POA: Diagnosis not present

## 2022-06-05 DIAGNOSIS — Z992 Dependence on renal dialysis: Secondary | ICD-10-CM | POA: Diagnosis not present

## 2022-06-06 DIAGNOSIS — Z992 Dependence on renal dialysis: Secondary | ICD-10-CM | POA: Diagnosis not present

## 2022-06-06 DIAGNOSIS — N186 End stage renal disease: Secondary | ICD-10-CM | POA: Diagnosis not present

## 2022-06-07 DIAGNOSIS — Z992 Dependence on renal dialysis: Secondary | ICD-10-CM | POA: Diagnosis not present

## 2022-06-07 DIAGNOSIS — N186 End stage renal disease: Secondary | ICD-10-CM | POA: Diagnosis not present

## 2022-06-09 DIAGNOSIS — Z992 Dependence on renal dialysis: Secondary | ICD-10-CM | POA: Diagnosis not present

## 2022-06-09 DIAGNOSIS — N186 End stage renal disease: Secondary | ICD-10-CM | POA: Diagnosis not present

## 2022-06-10 DIAGNOSIS — Z992 Dependence on renal dialysis: Secondary | ICD-10-CM | POA: Diagnosis not present

## 2022-06-10 DIAGNOSIS — N186 End stage renal disease: Secondary | ICD-10-CM | POA: Diagnosis not present

## 2022-06-11 DIAGNOSIS — Z992 Dependence on renal dialysis: Secondary | ICD-10-CM | POA: Diagnosis not present

## 2022-06-11 DIAGNOSIS — N186 End stage renal disease: Secondary | ICD-10-CM | POA: Diagnosis not present

## 2022-06-12 DIAGNOSIS — Z992 Dependence on renal dialysis: Secondary | ICD-10-CM | POA: Diagnosis not present

## 2022-06-12 DIAGNOSIS — N186 End stage renal disease: Secondary | ICD-10-CM | POA: Diagnosis not present

## 2022-06-13 ENCOUNTER — Inpatient Hospital Stay: Payer: Medicare HMO

## 2022-06-13 ENCOUNTER — Inpatient Hospital Stay: Payer: Medicare HMO | Attending: Hematology

## 2022-06-13 VITALS — BP 115/86 | HR 87 | Temp 97.5°F | Resp 20 | Wt 257.4 lb

## 2022-06-13 DIAGNOSIS — N186 End stage renal disease: Secondary | ICD-10-CM | POA: Diagnosis not present

## 2022-06-13 DIAGNOSIS — C9000 Multiple myeloma not having achieved remission: Secondary | ICD-10-CM | POA: Diagnosis present

## 2022-06-13 DIAGNOSIS — Z5112 Encounter for antineoplastic immunotherapy: Secondary | ICD-10-CM | POA: Insufficient documentation

## 2022-06-13 DIAGNOSIS — D472 Monoclonal gammopathy: Secondary | ICD-10-CM

## 2022-06-13 DIAGNOSIS — Z992 Dependence on renal dialysis: Secondary | ICD-10-CM | POA: Diagnosis not present

## 2022-06-13 LAB — COMPREHENSIVE METABOLIC PANEL
ALT: 23 U/L (ref 0–44)
AST: 23 U/L (ref 15–41)
Albumin: 3.9 g/dL (ref 3.5–5.0)
Alkaline Phosphatase: 55 U/L (ref 38–126)
Anion gap: 9 (ref 5–15)
BUN: 34 mg/dL — ABNORMAL HIGH (ref 8–23)
CO2: 25 mmol/L (ref 22–32)
Calcium: 9.4 mg/dL (ref 8.9–10.3)
Chloride: 104 mmol/L (ref 98–111)
Creatinine, Ser: 3.86 mg/dL — ABNORMAL HIGH (ref 0.61–1.24)
GFR, Estimated: 17 mL/min — ABNORMAL LOW (ref >60)
Glucose, Bld: 107 mg/dL — ABNORMAL HIGH (ref 70–99)
Potassium: 4 mmol/L (ref 3.5–5.1)
Sodium: 138 mmol/L (ref 135–145)
Total Bilirubin: 0.8 mg/dL (ref 0.3–1.2)
Total Protein: 7.2 g/dL (ref 6.5–8.1)

## 2022-06-13 LAB — CBC WITH DIFFERENTIAL/PLATELET
Abs Immature Granulocytes: 0 10*3/uL (ref 0.00–0.07)
Band Neutrophils: 2 %
Basophils Absolute: 0 10*3/uL (ref 0.0–0.1)
Basophils Relative: 1 %
Eosinophils Absolute: 0.1 10*3/uL (ref 0.0–0.5)
Eosinophils Relative: 3 %
HCT: 37.5 % — ABNORMAL LOW (ref 39.0–52.0)
Hemoglobin: 12.6 g/dL — ABNORMAL LOW (ref 13.0–17.0)
Lymphocytes Relative: 56 %
Lymphs Abs: 2.5 10*3/uL (ref 0.7–4.0)
MCH: 33.7 pg (ref 26.0–34.0)
MCHC: 33.6 g/dL (ref 30.0–36.0)
MCV: 100.3 fL — ABNORMAL HIGH (ref 80.0–100.0)
Monocytes Absolute: 0.3 10*3/uL (ref 0.1–1.0)
Monocytes Relative: 7 %
Neutro Abs: 1.5 10*3/uL — ABNORMAL LOW (ref 1.7–7.7)
Neutrophils Relative %: 31 %
Platelets: 111 10*3/uL — ABNORMAL LOW (ref 150–400)
RBC: 3.74 MIL/uL — ABNORMAL LOW (ref 4.22–5.81)
RDW: 17 % — ABNORMAL HIGH (ref 11.5–15.5)
WBC: 4.5 10*3/uL (ref 4.0–10.5)
nRBC: 0 % (ref 0.0–0.2)

## 2022-06-13 LAB — MAGNESIUM: Magnesium: 2.6 mg/dL — ABNORMAL HIGH (ref 1.7–2.4)

## 2022-06-13 MED ORDER — BORTEZOMIB CHEMO SQ INJECTION 3.5 MG (2.5MG/ML)
1.0000 mg/m2 | Freq: Once | INTRAMUSCULAR | Status: AC
  Administered 2022-06-13: 2.25 mg via SUBCUTANEOUS
  Filled 2022-06-13: qty 0.9

## 2022-06-13 MED ORDER — DARATUMUMAB-HYALURONIDASE-FIHJ 1800-30000 MG-UT/15ML ~~LOC~~ SOLN
1800.0000 mg | Freq: Once | SUBCUTANEOUS | Status: AC
  Administered 2022-06-13: 1800 mg via SUBCUTANEOUS
  Filled 2022-06-13: qty 15

## 2022-06-13 NOTE — Progress Notes (Signed)
Pt presents today for Dara SQ and Velcade injection per provider's order. Vital signs and labs WNL for treatment today. Okay to proceed with treatment today.   Pt took pre-meds at home prior to arrival.  Dara SQ and Velcade injections given today per MD orders. Tolerated infusion without adverse affects. Vital signs stable. No complaints at this time. Discharged from clinic ambulatory in stable condition. Alert and oriented x 3. F/U with [facility] as scheduled.

## 2022-06-13 NOTE — Patient Instructions (Signed)
[facility]  Discharge Instructions: Thank you for choosing [facility] to provide your oncology and hematology care.  If you have a lab appointment with the [facility], please come in thru the Main Entrance and check in at the main information desk.  Wear comfortable clothing and clothing appropriate for easy access to any Portacath or PICC line.   We strive to give you quality time with your provider. You may need to reschedule your appointment if you arrive late (15 or more minutes).  Arriving late affects you and other patients whose appointments are after yours.  Also, if you miss three or more appointments without notifying the office, you may be dismissed from the clinic at the provider's discretion.      For prescription refill requests, have your pharmacy contact our office and allow 72 hours for refills to be completed.    Today you received the following chemotherapy and/or immunotherapy agents Dara SQ and Velcade injection.   To help prevent nausea and vomiting after your treatment, we encourage you to take your nausea medication as directed.  BELOW ARE SYMPTOMS THAT SHOULD BE REPORTED IMMEDIATELY: *FEVER GREATER THAN 100.4 F (38 C) OR HIGHER *CHILLS OR SWEATING *NAUSEA AND VOMITING THAT IS NOT CONTROLLED WITH YOUR NAUSEA MEDICATION *UNUSUAL SHORTNESS OF BREATH *UNUSUAL BRUISING OR BLEEDING *URINARY PROBLEMS (pain or burning when urinating, or frequent urination) *BOWEL PROBLEMS (unusual diarrhea, constipation, pain near the anus) TENDERNESS IN MOUTH AND THROAT WITH OR WITHOUT PRESENCE OF ULCERS (sore throat, sores in mouth, or a toothache) UNUSUAL RASH, SWELLING OR PAIN  UNUSUAL VAGINAL DISCHARGE OR ITCHING   Items with * indicate a potential emergency and should be followed up as soon as possible or go to the Emergency Department if any problems should occur.  Please show the CHEMOTHERAPY ALERT CARD or IMMUNOTHERAPY ALERT CARD at  check-in to the Emergency Department and triage nurse.  Should you have questions after your visit or need to cancel or reschedule your appointment, please contact [facility] [phone]  and follow the prompts.  Office hours are 8:00 a.m. to 4:30 p.m. Monday - Friday. Please note that voicemails left after 4:00 p.m. may not be returned until the following business day.  We are closed weekends and major holidays. You have access to a nurse at all times for urgent questions. Please call the main number to the clinic [phone] and follow the prompts.  For any non-urgent questions, you may also contact your provider using MyChart. We now offer e-Visits for anyone 38 and older to request care online for non-urgent symptoms. For details visit [email or web address].   Also download the MyChart app! Go to the app store, search "MyChart", open the app, select [facility], and log in with your MyChart username and password.  Masks are optional in the cancer centers. If you would like for your care team to wear a mask while they are taking care of you, please let them know. For doctor visits, patients may have with them one support person who is at least 65 years old. At this time, visitors are not allowed in the infusion area.  Daratumumab; Hyaluronidase Injection What is this medication? DARATUMUMAB; HYALURONIDASE (dar a toom ue mab; hye al ur ON i dase) treats multiple myeloma, a type of bone marrow cancer. Daratumumab works by blocking a protein that causes cancer cells to grow and multiply. This helps to slow or stop the spread of cancer cells. Hyaluronidase works  by increasing the absorption of other medications in the body to help them work better. This medication may also be used treat amyloidosis, a condition that causes the buildup of a protein (amyloid) in your body. It works by reducing the buildup of this protein, which decreases symptoms. It is a combination medication  that contains a monoclonal antibody. This medicine may be used for other purposes; ask your health care provider or pharmacist if you have questions. COMMON BRAND NAME(S): DARZALEX FASPRO What should I tell my care team before I take this medication? They need to know if you have any of these conditions: Heart disease Infection, such as chickenpox, cold sores, herpes, hepatitis B Lung or breathing disease An unusual or allergic reaction to daratumumab, hyaluronidase, other medications, foods, dyes, or preservatives Pregnant or trying to get pregnant Breast-feeding How should I use this medication? This medication is injected under the skin. It is given by your care team in a hospital or clinic setting. Talk to your care team about the use of this medication in children. Special care may be needed. Overdosage: If you think you have taken too much of this medicine contact a poison control center or emergency room at once. NOTE: This medicine is only for you. Do not share this medicine with others. What if I miss a dose? Keep appointments for follow-up doses. It is important not to miss your dose. Call your care team if you are unable to keep an appointment. What may interact with this medication? Interactions have not been studied. This list may not describe all possible interactions. Give your health care provider a list of all the medicines, herbs, non-prescription drugs, or dietary supplements you use. Also tell them if you smoke, drink alcohol, or use illegal drugs. Some items may interact with your medicine. What should I watch for while using this medication? Your condition will be monitored carefully while you are receiving this medication. This medication can cause serious allergic reactions. To reduce your risk, your care team may give you other medication to take before receiving this one. Be sure to follow the directions from your care team. This medication can affect the results of  blood tests to match your blood type. These changes can last for up to 6 months after the final dose. Your care team will do blood tests to match your blood type before you start treatment. Tell all of your care team that you are being treated with this medication before receiving a blood transfusion. This medication can affect the results of some tests used to determine treatment response; extra tests may be needed to evaluate response. Talk to your care team if you wish to become pregnant or think you are pregnant. This medication can cause serious birth defects if taken during pregnancy and for 3 months after the last dose. A reliable form of contraception is recommended while taking this medication and for 3 months after the last dose. Talk to your care team about effective forms of contraception. Do not breast-feed while taking this medication. What side effects may I notice from receiving this medication? Side effects that you should report to your care team as soon as possible: Allergic reactions--skin rash, itching, hives, swelling of the face, lips, tongue, or throat Heart rhythm changes--fast or irregular heartbeat, dizziness, feeling faint or lightheaded, chest pain, trouble breathing Infection--fever, chills, cough, sore throat, wounds that don't heal, pain or trouble when passing urine, general feeling of discomfort or being unwell Infusion reactions--chest pain, shortness  of breath or trouble breathing, feeling faint or lightheaded Sudden eye pain or change in vision such as blurry vision, seeing halos around lights, vision loss Unusual bruising or bleeding Side effects that usually do not require medical attention (report to your care team if they continue or are bothersome): Constipation Diarrhea Fatigue Nausea Pain, tingling, or numbness in the hands or feet Swelling of the ankles, hands, or feet This list may not describe all possible side effects. Call your doctor for medical  advice about side effects. You may report side effects to FDA at 1-800-FDA-1088. Where should I keep my medication? This medication is given in a hospital or clinic. It will not be stored at home. NOTE: This sheet is a summary. It may not cover all possible information. If you have questions about this medicine, talk to your doctor, pharmacist, or health care provider.  2023 Elsevier/Gold Standard (2022-02-13 00:00:00)  Bortezomib Injection What is this medication? BORTEZOMIB (bor TEZ oh mib) treats lymphoma. It may also be used to treat multiple myeloma, a type of bone marrow cancer. It works by blocking a protein that causes cancer cells to grow and multiply. This helps to slow or stop the spread of cancer cells. This medicine may be used for other purposes; ask your health care provider or pharmacist if you have questions. COMMON BRAND NAME(S): Velcade What should I tell my care team before I take this medication? They need to know if you have any of these conditions: Dehydration Diabetes Heart disease Liver disease Tingling of the fingers or toes or other nerve disorder An unusual or allergic reaction to bortezomib, other medications, foods, dyes, or preservatives If you or your partner are pregnant or trying to get pregnant Breastfeeding How should I use this medication? This medication is injected into a vein or under the skin. It is given by your care team in a hospital or clinic setting. Talk to your care team about the use of this medication in children. Special care may be needed. Overdosage: If you think you have taken too much of this medicine contact a poison control center or emergency room at once. NOTE: This medicine is only for you. Do not share this medicine with others. What if I miss a dose? Keep appointments for follow-up doses. It is important not to miss your dose. Call your care team if you are unable to keep an appointment. What may interact with this  medication? Ketoconazole Rifampin This list may not describe all possible interactions. Give your health care provider a list of all the medicines, herbs, non-prescription drugs, or dietary supplements you use. Also tell them if you smoke, drink alcohol, or use illegal drugs. Some items may interact with your medicine. What should I watch for while using this medication? Your condition will be monitored carefully while you are receiving this medication. You may need blood work while taking this medication. This medication may affect your coordination, reaction time, or judgment. Do not drive or operate machinery until you know how this medication affects you. Sit up or stand slowly to reduce the risk of dizzy or fainting spells. Drinking alcohol with this medication can increase the risk of these side effects. This medication may increase your risk of getting an infection. Call your care team for advice if you get a fever, chills, sore throat, or other symptoms of a cold or flu. Do not treat yourself. Try to avoid being around people who are sick. Check with your care team if you  have severe diarrhea, nausea, and vomiting, or if you sweat a lot. The loss of too much body fluid may make it dangerous for you to take this medication. Talk to your care team if you may be pregnant. Serious birth defects can occur if you take this medication during pregnancy and for 7 months after the last dose. You will need a negative pregnancy test before starting this medication. Contraception is recommended while taking this medication and for 7 months after the last dose. Your care team can help you find the option that works for you. If your partner can get pregnant, use a condom during sex while taking this medication and for 4 months after the last dose. Do not breastfeed while taking this medication and for 2 months after the last dose. This medication may cause infertility. Talk to your care team if you are  concerned about your fertility. What side effects may I notice from receiving this medication? Side effects that you should report to your care team as soon as possible: Allergic reactions--skin rash, itching, hives, swelling of the face, lips, tongue, or throat Bleeding--bloody or black, tar-like stools, vomiting blood or brown material that looks like coffee grounds, red or dark brown urine, small red or purple spots on skin, unusual bruising or bleeding Bleeding in the brain--severe headache, stiff neck, confusion, dizziness, change in vision, numbness or weakness of the face, arm, or leg, trouble speaking, trouble walking, vomiting Bowel blockage--stomach cramping, unable to have a bowel movement or pass gas, loss of appetite, vomiting Heart failure--shortness of breath, swelling of the ankles, feet, or hands, sudden weight gain, unusual weakness or fatigue Infection--fever, chills, cough, sore throat, wounds that don't heal, pain or trouble when passing urine, general feeling of discomfort or being unwell Liver injury--right upper belly pain, loss of appetite, nausea, light-colored stool, dark yellow or brown urine, yellowing skin or eyes, unusual weakness or fatigue Low blood pressure--dizziness, feeling faint or lightheaded, blurry vision Lung injury--shortness of breath or trouble breathing, cough, spitting up blood, chest pain, fever Pain, tingling, or numbness in the hands or feet Severe or prolonged diarrhea Stomach pain, bloody diarrhea, pale skin, unusual weakness or fatigue, decrease in the amount of urine, which may be signs of hemolytic uremic syndrome Sudden and severe headache, confusion, change in vision, seizures, which may be signs of posterior reversible encephalopathy syndrome (PRES) TTP--purple spots on the skin or inside the mouth, pale skin, yellowing skin or eyes, unusual weakness or fatigue, fever, fast or irregular heartbeat, confusion, change in vision, trouble speaking,  trouble walking Tumor lysis syndrome (TLS)--nausea, vomiting, diarrhea, decrease in the amount of urine, dark urine, unusual weakness or fatigue, confusion, muscle pain or cramps, fast or irregular heartbeat, joint pain Side effects that usually do not require medical attention (report to your care team if they continue or are bothersome): Constipation Diarrhea Fatigue Loss of appetite Nausea This list may not describe all possible side effects. Call your doctor for medical advice about side effects. You may report side effects to FDA at 1-800-FDA-1088. Where should I keep my medication? This medication is given in a hospital or clinic. It will not be stored at home. NOTE: This sheet is a summary. It may not cover all possible information. If you have questions about this medicine, talk to your doctor, pharmacist, or health care provider.  2023 Elsevier/Gold Standard (2022-03-20 00:00:00)

## 2022-06-14 DIAGNOSIS — Z992 Dependence on renal dialysis: Secondary | ICD-10-CM | POA: Diagnosis not present

## 2022-06-14 DIAGNOSIS — N186 End stage renal disease: Secondary | ICD-10-CM | POA: Diagnosis not present

## 2022-06-16 DIAGNOSIS — Z992 Dependence on renal dialysis: Secondary | ICD-10-CM | POA: Diagnosis not present

## 2022-06-16 DIAGNOSIS — N186 End stage renal disease: Secondary | ICD-10-CM | POA: Diagnosis not present

## 2022-06-17 DIAGNOSIS — Z992 Dependence on renal dialysis: Secondary | ICD-10-CM | POA: Diagnosis not present

## 2022-06-17 DIAGNOSIS — N186 End stage renal disease: Secondary | ICD-10-CM | POA: Diagnosis not present

## 2022-06-18 DIAGNOSIS — Z992 Dependence on renal dialysis: Secondary | ICD-10-CM | POA: Diagnosis not present

## 2022-06-18 DIAGNOSIS — N186 End stage renal disease: Secondary | ICD-10-CM | POA: Diagnosis not present

## 2022-06-19 DIAGNOSIS — Z992 Dependence on renal dialysis: Secondary | ICD-10-CM | POA: Diagnosis not present

## 2022-06-19 DIAGNOSIS — N186 End stage renal disease: Secondary | ICD-10-CM | POA: Diagnosis not present

## 2022-06-20 DIAGNOSIS — Z992 Dependence on renal dialysis: Secondary | ICD-10-CM | POA: Diagnosis not present

## 2022-06-20 DIAGNOSIS — N186 End stage renal disease: Secondary | ICD-10-CM | POA: Diagnosis not present

## 2022-06-21 DIAGNOSIS — Z992 Dependence on renal dialysis: Secondary | ICD-10-CM | POA: Diagnosis not present

## 2022-06-21 DIAGNOSIS — N186 End stage renal disease: Secondary | ICD-10-CM | POA: Diagnosis not present

## 2022-06-23 DIAGNOSIS — Z992 Dependence on renal dialysis: Secondary | ICD-10-CM | POA: Diagnosis not present

## 2022-06-23 DIAGNOSIS — N186 End stage renal disease: Secondary | ICD-10-CM | POA: Diagnosis not present

## 2022-06-24 DIAGNOSIS — Z992 Dependence on renal dialysis: Secondary | ICD-10-CM | POA: Diagnosis not present

## 2022-06-24 DIAGNOSIS — N186 End stage renal disease: Secondary | ICD-10-CM | POA: Diagnosis not present

## 2022-06-25 DIAGNOSIS — Z992 Dependence on renal dialysis: Secondary | ICD-10-CM | POA: Diagnosis not present

## 2022-06-25 DIAGNOSIS — N186 End stage renal disease: Secondary | ICD-10-CM | POA: Diagnosis not present

## 2022-06-26 DIAGNOSIS — N186 End stage renal disease: Secondary | ICD-10-CM | POA: Diagnosis not present

## 2022-06-26 DIAGNOSIS — Z992 Dependence on renal dialysis: Secondary | ICD-10-CM | POA: Diagnosis not present

## 2022-06-27 ENCOUNTER — Inpatient Hospital Stay: Payer: Medicare HMO

## 2022-06-27 VITALS — BP 98/76 | HR 98 | Temp 97.7°F | Resp 20 | Wt 253.8 lb

## 2022-06-27 DIAGNOSIS — C9000 Multiple myeloma not having achieved remission: Secondary | ICD-10-CM

## 2022-06-27 DIAGNOSIS — N186 End stage renal disease: Secondary | ICD-10-CM | POA: Diagnosis not present

## 2022-06-27 DIAGNOSIS — D472 Monoclonal gammopathy: Secondary | ICD-10-CM

## 2022-06-27 DIAGNOSIS — Z992 Dependence on renal dialysis: Secondary | ICD-10-CM | POA: Diagnosis not present

## 2022-06-27 DIAGNOSIS — Z5112 Encounter for antineoplastic immunotherapy: Secondary | ICD-10-CM | POA: Diagnosis not present

## 2022-06-27 LAB — CBC WITH DIFFERENTIAL/PLATELET
Abs Immature Granulocytes: 0 10*3/uL (ref 0.00–0.07)
Band Neutrophils: 2 %
Basophils Absolute: 0 10*3/uL (ref 0.0–0.1)
Basophils Relative: 1 %
Eosinophils Absolute: 0.1 10*3/uL (ref 0.0–0.5)
Eosinophils Relative: 2 %
HCT: 38.4 % — ABNORMAL LOW (ref 39.0–52.0)
Hemoglobin: 13.3 g/dL (ref 13.0–17.0)
Lymphocytes Relative: 57 %
Lymphs Abs: 2.3 10*3/uL (ref 0.7–4.0)
MCH: 34.4 pg — ABNORMAL HIGH (ref 26.0–34.0)
MCHC: 34.6 g/dL (ref 30.0–36.0)
MCV: 99.2 fL (ref 80.0–100.0)
Monocytes Absolute: 0.3 10*3/uL (ref 0.1–1.0)
Monocytes Relative: 7 %
Neutro Abs: 1.3 10*3/uL — ABNORMAL LOW (ref 1.7–7.7)
Neutrophils Relative %: 31 %
Platelets: 103 10*3/uL — ABNORMAL LOW (ref 150–400)
RBC: 3.87 MIL/uL — ABNORMAL LOW (ref 4.22–5.81)
RDW: 16.5 % — ABNORMAL HIGH (ref 11.5–15.5)
WBC: 4 10*3/uL (ref 4.0–10.5)
nRBC: 0 % (ref 0.0–0.2)

## 2022-06-27 LAB — COMPREHENSIVE METABOLIC PANEL
ALT: 26 U/L (ref 0–44)
AST: 19 U/L (ref 15–41)
Albumin: 4 g/dL (ref 3.5–5.0)
Alkaline Phosphatase: 53 U/L (ref 38–126)
Anion gap: 11 (ref 5–15)
BUN: 37 mg/dL — ABNORMAL HIGH (ref 8–23)
CO2: 24 mmol/L (ref 22–32)
Calcium: 9.2 mg/dL (ref 8.9–10.3)
Chloride: 102 mmol/L (ref 98–111)
Creatinine, Ser: 3.97 mg/dL — ABNORMAL HIGH (ref 0.61–1.24)
GFR, Estimated: 16 mL/min — ABNORMAL LOW (ref >60)
Glucose, Bld: 119 mg/dL — ABNORMAL HIGH (ref 70–99)
Potassium: 3.4 mmol/L — ABNORMAL LOW (ref 3.5–5.1)
Sodium: 137 mmol/L (ref 135–145)
Total Bilirubin: 0.6 mg/dL (ref 0.3–1.2)
Total Protein: 7.3 g/dL (ref 6.5–8.1)

## 2022-06-27 LAB — MAGNESIUM: Magnesium: 2.3 mg/dL (ref 1.7–2.4)

## 2022-06-27 MED ORDER — BORTEZOMIB CHEMO SQ INJECTION 3.5 MG (2.5MG/ML)
1.0000 mg/m2 | Freq: Once | INTRAMUSCULAR | Status: AC
  Administered 2022-06-27: 2.25 mg via SUBCUTANEOUS
  Filled 2022-06-27: qty 0.9

## 2022-06-27 NOTE — Progress Notes (Signed)
Patient presents today for Velcade, patient reports taking pre-meds at home. Potassium 3.4 patient declines taking potassium in clinic and states he replenishes his potassium via diet.  ANC 1.3 Dr. Delton Coombes made aware, okay to treat per MD. Patient tolerated Velcade injection with no complaints voiced. Lab work reviewed. See MAR for details. Injection site clean and dry with no bruising or swelling noted. Patient stable during and after injection. Band aid applied. VSS. Patient left in satisfactory condition with no s/s of distress noted.

## 2022-06-27 NOTE — Patient Instructions (Signed)
[facility]  Discharge Instructions: Thank you for choosing [facility] to provide your oncology and hematology care.  If you have a lab appointment with the Cancer Center, please come in thru the Main Entrance and check in at the main information desk.  Wear comfortable clothing and clothing appropriate for easy access to any Portacath or PICC line.   We strive to give you quality time with your provider. You may need to reschedule your appointment if you arrive late (15 or more minutes).  Arriving late affects you and other patients whose appointments are after yours.  Also, if you miss three or more appointments without notifying the office, you may be dismissed from the clinic at the provider's discretion.      For prescription refill requests, have your pharmacy contact our office and allow 72 hours for refills to be completed.    Today you received the following chemotherapy and/or immunotherapy agents Velcade, return as scheduled.   To help prevent nausea and vomiting after your treatment, we encourage you to take your nausea medication as directed.  BELOW ARE SYMPTOMS THAT SHOULD BE REPORTED IMMEDIATELY: *FEVER GREATER THAN 100.4 F (38 C) OR HIGHER *CHILLS OR SWEATING *NAUSEA AND VOMITING THAT IS NOT CONTROLLED WITH YOUR NAUSEA MEDICATION *UNUSUAL SHORTNESS OF BREATH *UNUSUAL BRUISING OR BLEEDING *URINARY PROBLEMS (pain or burning when urinating, or frequent urination) *BOWEL PROBLEMS (unusual diarrhea, constipation, pain near the anus) TENDERNESS IN MOUTH AND THROAT WITH OR WITHOUT PRESENCE OF ULCERS (sore throat, sores in mouth, or a toothache) UNUSUAL RASH, SWELLING OR PAIN  UNUSUAL VAGINAL DISCHARGE OR ITCHING   Items with * indicate a potential emergency and should be followed up as soon as possible or go to the Emergency Department if any problems should occur.  Please show the CHEMOTHERAPY ALERT CARD or IMMUNOTHERAPY ALERT CARD at  check-in to the Emergency Department and triage nurse.  Should you have questions after your visit or need to cancel or reschedule your appointment, please contact [facility] [phone]  and follow the prompts.  Office hours are 8:00 a.m. to 4:30 p.m. Monday - Friday. Please note that voicemails left after 4:00 p.m. may not be returned until the following business day.  We are closed weekends and major holidays. You have access to a nurse at all times for urgent questions. Please call the main number to the clinic [phone] and follow the prompts.  For any non-urgent questions, you may also contact your provider using MyChart. We now offer e-Visits for anyone 18 and older to request care online for non-urgent symptoms. For details visit [email or web address].   Also download the MyChart app! Go to the app store, search "MyChart", open the app, select [facility], and log in with your MyChart username and password.  Masks are optional in the cancer centers. If you would like for your care team to wear a mask while they are taking care of you, please let them know. You may have one support person who is at least 65 years old accompany you for your appointments.  

## 2022-06-28 DIAGNOSIS — Z992 Dependence on renal dialysis: Secondary | ICD-10-CM | POA: Diagnosis not present

## 2022-06-28 DIAGNOSIS — N186 End stage renal disease: Secondary | ICD-10-CM | POA: Diagnosis not present

## 2022-06-28 LAB — KAPPA/LAMBDA LIGHT CHAINS
Kappa free light chain: 38.6 mg/L — ABNORMAL HIGH (ref 3.3–19.4)
Kappa, lambda light chain ratio: 3.22 — ABNORMAL HIGH (ref 0.26–1.65)
Lambda free light chains: 12 mg/L (ref 5.7–26.3)

## 2022-06-30 DIAGNOSIS — Z992 Dependence on renal dialysis: Secondary | ICD-10-CM | POA: Diagnosis not present

## 2022-06-30 DIAGNOSIS — N186 End stage renal disease: Secondary | ICD-10-CM | POA: Diagnosis not present

## 2022-07-01 DIAGNOSIS — Z992 Dependence on renal dialysis: Secondary | ICD-10-CM | POA: Diagnosis not present

## 2022-07-01 DIAGNOSIS — N186 End stage renal disease: Secondary | ICD-10-CM | POA: Diagnosis not present

## 2022-07-01 LAB — PROTEIN ELECTROPHORESIS, SERUM
A/G Ratio: 1.4 (ref 0.7–1.7)
Albumin ELP: 3.8 g/dL (ref 2.9–4.4)
Alpha-1-Globulin: 0.2 g/dL (ref 0.0–0.4)
Alpha-2-Globulin: 1 g/dL (ref 0.4–1.0)
Beta Globulin: 0.9 g/dL (ref 0.7–1.3)
Gamma Globulin: 0.6 g/dL (ref 0.4–1.8)
Globulin, Total: 2.7 g/dL (ref 2.2–3.9)
M-Spike, %: 0.2 g/dL — ABNORMAL HIGH
Total Protein ELP: 6.5 g/dL (ref 6.0–8.5)

## 2022-07-01 LAB — MISC LABCORP TEST (SEND OUT): Labcorp test code: 123218

## 2022-07-02 DIAGNOSIS — N186 End stage renal disease: Secondary | ICD-10-CM | POA: Diagnosis not present

## 2022-07-02 DIAGNOSIS — Z992 Dependence on renal dialysis: Secondary | ICD-10-CM | POA: Diagnosis not present

## 2022-07-03 DIAGNOSIS — Z992 Dependence on renal dialysis: Secondary | ICD-10-CM | POA: Diagnosis not present

## 2022-07-03 DIAGNOSIS — N186 End stage renal disease: Secondary | ICD-10-CM | POA: Diagnosis not present

## 2022-07-04 ENCOUNTER — Inpatient Hospital Stay (HOSPITAL_BASED_OUTPATIENT_CLINIC_OR_DEPARTMENT_OTHER): Payer: Medicare HMO | Admitting: Hematology

## 2022-07-04 ENCOUNTER — Other Ambulatory Visit (HOSPITAL_COMMUNITY): Payer: Medicare HMO

## 2022-07-04 VITALS — BP 101/89 | HR 102 | Temp 98.3°F | Resp 16 | Ht 67.0 in | Wt 254.9 lb

## 2022-07-04 DIAGNOSIS — C9000 Multiple myeloma not having achieved remission: Secondary | ICD-10-CM | POA: Diagnosis not present

## 2022-07-04 DIAGNOSIS — Z992 Dependence on renal dialysis: Secondary | ICD-10-CM | POA: Diagnosis not present

## 2022-07-04 DIAGNOSIS — Z5112 Encounter for antineoplastic immunotherapy: Secondary | ICD-10-CM | POA: Diagnosis not present

## 2022-07-04 DIAGNOSIS — N186 End stage renal disease: Secondary | ICD-10-CM | POA: Diagnosis not present

## 2022-07-04 NOTE — Progress Notes (Signed)
[place], [place] [address]   CLINIC:  Medical Oncology/Hematology  PCP:  Derek Jack, MD [address] / [place] [address] [phone]   REASON FOR VISIT:  Follow-up for multiple myeloma  PRIOR THERAPY: CyBorD  NGS Results: not done  CURRENT THERAPY: Daratumumab SQ + Bortezomib + Dexamethasone (DaraVd) q21d / Daratumumab SQ q28d   BRIEF ONCOLOGIC HISTORY:  Oncology History  Multiple myeloma ([facility])  03/06/2021 Initial Diagnosis   Multiple myeloma ([facility])   03/06/2021 Cancer Staging   Staging form: Plasma Cell Myeloma and Plasma Cell Disorders, AJCC 8th Edition - Clinical stage from 03/06/2021: RISS Stage II (Beta-2-microglobulin (mg/L): 5.5, Albumin (g/dL): 3.5, ISS: Stage III, High-risk cytogenetics: Absent, LDH: Normal) - Signed by Derek Jack, MD on 03/06/2021 Stage prefix: Initial diagnosis Beta 2 microglobulin range (mg/L): Greater than or equal to 5.5 Albumin range (g/dL): Greater than or equal to 3.5 Cytogenetics: No abnormalities Pretreatment monoclonal protein level in serum (M spike) (g/dL): 2 Pretreatment serum free kappa light chain level (g/L): 794 Pretreatment serum free lambda light chain level (g/L): 15.9   03/14/2021 - 07/04/2021 Chemotherapy   Patient is on Treatment Plan : MULTIPLE MYELOMA CyBorD - Weekly Bortezomib     08/30/2021 -  Chemotherapy   Patient is on Treatment Plan : MYELOMA RELAPSED / REFRACTORY Daratumumab SQ + Bortezomib + Dexamethasone (DaraVd) q21d / Daratumumab SQ q28d        CANCER STAGING:  Cancer Staging  Multiple myeloma ([facility]) Staging form: Plasma Cell Myeloma and Plasma Cell Disorders, AJCC 8th Edition - Clinical stage from 03/06/2021: RISS Stage II (Beta-2-microglobulin (mg/L): 5.5, Albumin (g/dL): 3.5, ISS: Stage III, High-risk cytogenetics: Absent, LDH: Normal) - Signed by Derek Jack, MD on 03/06/2021   INTERVAL HISTORY:  Ernest Barnett, a 66 y.o. male, seen for  follow-up of multiple myeloma.  He is tolerating Darzalex and bortezomib very well.  He is doing home peritoneal dialysis which is also going well.  Denies any tingling or numbness in extremities.  No recent infections or ER visits.   REVIEW OF SYSTEMS:  Review of Systems  Constitutional:  Negative for appetite change and unexpected weight change.  Respiratory:  Positive for shortness of breath.   Gastrointestinal:  Positive for constipation.  Neurological:  Positive for dizziness. Negative for numbness.  Hematological:  Bruises/bleeds easily.  All other systems reviewed and are negative.   PAST MEDICAL/SURGICAL HISTORY:  Past Medical History:  Diagnosis Date   Chronic kidney disease    COPD (chronic obstructive pulmonary disease) (HCC)    CVA (cerebral vascular accident) ([facility]) 2013   Cystoid macular edema of left eye 03/07/2021   GERD (gastroesophageal reflux disease)    HOH (hard of hearing)    Hypercholesteremia    Hypertension    Left epiretinal membrane 03/07/2021   Pseudophakia 03/07/2021   Stroke ([facility]) 03/24/2012   left sided weakness   Vitamin D deficiency    Past Surgical History:  Procedure Laterality Date   A/V FISTULAGRAM Right 01/29/2022   Procedure: A/V Fistulagram;  Surgeon: Serafina Mitchell, MD;  Location: [facility];  Service: Cardiovascular;  Laterality: Right;   AV FISTULA PLACEMENT Right 02/08/2021   Procedure: RIGHT ARM ARTERIOVENOUS (AV) FISTULA CREATION;  Surgeon: [name], MD;  Location: [facility];  Service: Vascular;  Laterality: Right;   BIOPSY  11/12/2018   Procedure: BIOPSY;  Surgeon: Rogene Houston, MD;  [facility];  Service: Endoscopy;;  colon   BIOPSY  05/10/2020   Procedure: BIOPSY;  Surgeon: Rogene Houston, MD;  [facility];  Service: Endoscopy;;  esophagus   CATARACT EXTRACTION     right eye   CATARACT EXTRACTION [facility]  10/08/2012   Procedure: CATARACT EXTRACTION PHACO AND INTRAOCULAR LENS PLACEMENT ([place]);   Surgeon: Tonny Branch, MD;  Location: [facility];  Service: Ophthalmology;  Laterality: Left;  CDE:6.64   CHOLECYSTECTOMY     [place]   COLONOSCOPY N/A 11/12/2018   Procedure: COLONOSCOPY;  Surgeon: Rogene Houston, MD;  [facility];  Service: Endoscopy;  Laterality: N/A;  1030   ELBOW FRACTURE SURGERY     left   ESOPHAGEAL DILATION N/A 11/12/2018   Procedure: ESOPHAGEAL DILATION;  Surgeon: Rogene Houston, MD;  [facility];  Service: Endoscopy;  Laterality: N/A;   ESOPHAGEAL DILATION N/A 05/10/2020   Procedure: ESOPHAGEAL DILATION;  Surgeon: Rogene Houston, MD;  [facility];  Service: Endoscopy;  Laterality: N/A;   ESOPHAGOGASTRODUODENOSCOPY N/A 11/12/2018   Procedure: ESOPHAGOGASTRODUODENOSCOPY (EGD);  Surgeon: Rogene Houston, MD;  [facility];  Service: Endoscopy;  Laterality: N/A;   ESOPHAGOGASTRODUODENOSCOPY N/A 05/10/2020   Procedure: ESOPHAGOGASTRODUODENOSCOPY (EGD);  Surgeon: Rogene Houston, MD;  [facility];  Service: Endoscopy;  Laterality: N/A;  210   HERNIA REPAIR     right inguinal   HYDROCELE EXCISION / REPAIR     IR FLUORO GUIDE CV LINE LEFT  08/17/2021   IR US GUIDE VASC ACCESS LEFT  08/17/2021   LIGATION OF COMPETING BRANCHES OF ARTERIOVENOUS FISTULA Right 09/11/2021   Procedure: LIGATION OF COMPETING BRANCHES OF RIGHT ARM ARTERIOVENOUS FISTULA;  Surgeon: [name], MD;  Location: [facility];  Service: Vascular;  Laterality: Right;   PERIPHERAL VASCULAR BALLOON ANGIOPLASTY Right 01/29/2022   Procedure: PERIPHERAL VASCULAR BALLOON ANGIOPLASTY;  Surgeon: Serafina Mitchell, MD;  Location: [facility];  Service: Cardiovascular;  Laterality: Right;  arm fistula   POLYPECTOMY  11/12/2018   Procedure: POLYPECTOMY;  Surgeon: Rogene Houston, MD;  [facility];  Service: Endoscopy;;  colon    RETINAL DETACHMENT SURGERY Left 2019   SPLENECTOMY, TOTAL     TONSILLECTOMY     VOCAL CORD INJECTION     removal of polyp-2005     SOCIAL HISTORY:  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  Tobacco Use   Smoking status: Former    Packs/day: 3.00    Years: 30.00    Total pack years: 90.00    Types: Cigarettes   Smokeless tobacco: Current   Tobacco comments:    Currently vape  Vaping Use   Vaping Use: Every day   Substances: Nicotine  Substance and Sexual Activity   Alcohol use: Yes    Alcohol/week: 1.0 - 2.0 standard drink of alcohol    Types: 1 - 2 Cans of beer per week    Comment: Occasionally   Drug use: No   Sexual activity: Not Currently    Birth control/protection: Abstinence, None  Other Topics Concern   Not on file  Social History Narrative   2 children, both nearby  Social Determinants of Health   Financial Resource Strain: Low Risk  (05/30/2022)   Overall Financial Resource Strain (CARDIA)    Difficulty of Paying Living Expenses: Not hard at all  Food Insecurity: No Food Insecurity (05/30/2022)   Hunger Vital Sign    Worried About Running Out of Food in the Last Year: Never true    Ran Out of Food in the Last Year: Never true  Transportation Needs: No Transportation Needs (05/30/2022)   PRAPARE - Hydrologist (Medical): No    Lack of Transportation (Non-Medical): No  Physical Activity: Unknown (05/30/2022)   Exercise Vital Sign    Days of Exercise per Week: Not on file    Minutes of Exercise per Session: 0 min  Stress: No Stress Concern Present (05/30/2022)   [place]    Feeling of Stress : Not at all  Social Connections: Socially Isolated (05/30/2022)   Social Connection and Isolation Panel [NHANES]    Frequency of  Communication with Friends and Family: More than three times a week    Frequency of Social Gatherings with Friends and Family: More than three times a week    Attends Religious Services: Never    Marine scientist or Organizations: No    Attends Archivist Meetings: Never    Marital Status: Divorced  Human resources officer Violence: Not At Risk (05/30/2022)   Humiliation, Afraid, Rape, and Kick questionnaire    Fear of Current or Ex-Partner: No    Emotionally Abused: No    Physically Abused: No    Sexually Abused: No    FAMILY HISTORY:  No family history on file.  CURRENT MEDICATIONS:  Current Outpatient Medications  Medication Sig Dispense Refill   acyclovir (ZOVIRAX) 200 MG capsule Take 1 capsule (200 mg total) by mouth 2 (two) times daily. 60 capsule 6   albuterol (VENTOLIN HFA) 108 (90 Base) MCG/ACT inhaler Inhale 2 puffs into the lungs every 6 (six) hours as needed for wheezing or shortness of breath. 8 g 5   aspirin EC 81 MG tablet Take 1 tablet (81 mg total) by mouth daily. 30 tablet 11   bortezomib IV (VELCADE) 3.5 MG injection Inject 1.5 mg/m2 into the vein once a week.     budesonide-formoterol (SYMBICORT) 80-4.5 MCG/ACT inhaler Inhale 2 puffs into the lungs 2 (two) times daily as needed (shortness of breath). 10.2 each 5   dexamethasone (DECADRON) 4 MG tablet Take 2.5 tablets (10 mg total) by mouth once a week. Take at home along with Tylenol and Benadryl weekly on the day of daratumumab injection. 30 tablet 2   docusate sodium (COLACE) 100 MG capsule Take 100 mg by mouth daily.     gabapentin (NEURONTIN) 300 MG capsule Take 1 capsule by mouth twice daily 60 capsule 6   HYDROcodone-acetaminophen (NORCO/VICODIN) 5-325 MG tablet Take by mouth.     midodrine (PROAMATINE) 10 MG tablet Take by mouth.     multivitamin (RENA-VIT) TABS tablet Take 1 tablet by mouth daily.     Omega-3 Fatty Acids (FISH OIL) 1000 MG CAPS Take 1,000 mg by mouth in the morning, at noon, and at  bedtime.      oxyCODONE (OXY IR/ROXICODONE) 5 MG immediate release tablet Take 1 tablet (5 mg total) by mouth 2 (two) times daily as needed for severe pain. 30 tablet 0   pantoprazole (PROTONIX) 40 MG tablet Take 1 tablet (40 mg total) by mouth  daily. 90 tablet 3   polyethylene glycol (MIRALAX / GLYCOLAX) packet Take 17 g by mouth daily. Patient states that he takes as needed. 30 each 5   pravastatin (PRAVACHOL) 80 MG tablet Take 1 tablet (80 mg total) by mouth daily. 90 tablet 3   sildenafil (REVATIO) 20 MG tablet Take 1 tablet (20 mg total) by mouth 5 (five) times daily as needed. 30 tablet 2   tamsulosin (FLOMAX) 0.4 MG CAPS capsule Take 1 capsule by mouth once daily 30 capsule 3   tiZANidine (ZANAFLEX) 2 MG tablet TAKE 1 TABLET BY MOUTH EVERY 6 HOURS AS NEEDED FOR MUSCLE SPASM 30 tablet 0   torsemide (DEMADEX) 100 MG tablet Take 100 mg by mouth 2 (two) times daily.     No current facility-administered medications for this visit.   Facility-Administered Medications Ordered in Other Visits  Medication Dose Route Frequency Provider Last Rate Last Admin   palonosetron (ALOXI) 0.25 MG/5ML injection             ALLERGIES:  No Known Allergies  PHYSICAL EXAM:  Performance status (ECOG): 1 - Symptomatic but completely ambulatory  Vitals:   07/04/22 1200  BP: 101/89  Pulse: (!) 102  Resp: 16  Temp: 98.3 F (36.8 C)  SpO2: 95%   Wt Readings from Last 3 Encounters:  07/04/22 254 lb 13.6 oz (115.6 kg)  06/27/22 253 lb 12.8 oz (115.1 kg)  06/13/22 257 lb 6.4 oz (116.8 kg)   Physical Exam Vitals reviewed.  Constitutional:      Appearance: Normal appearance.  Cardiovascular:     Rate and Rhythm: Normal rate and regular rhythm.     Pulses: Normal pulses.     Heart sounds: Normal heart sounds.  Pulmonary:     Effort: Pulmonary effort is normal.     Breath sounds: Normal breath sounds.  Neurological:     General: No focal deficit present.     Mental Status: He is alert and oriented  to person, place, and time.  Psychiatric:        Mood and Affect: Mood normal.        Behavior: Behavior normal.     LABORATORY DATA:  I have reviewed the labs as listed.     Latest Ref Rng & Units 06/27/2022   10:38 AM 06/13/2022   11:37 AM 05/30/2022   10:18 AM  CBC  WBC 4.0 - 10.5 K/uL 4.0  4.5  6.4   Hemoglobin 13.0 - 17.0 g/dL 13.3  12.6  12.3   Hematocrit 39.0 - 52.0 % 38.4  37.5  36.7   Platelets 150 - 400 K/uL 103  111  128       Latest Ref Rng & Units 06/27/2022   10:38 AM 06/13/2022   11:37 AM 05/30/2022   10:18 AM  CMP  Glucose 70 - 99 mg/dL 119  107  113   BUN 8 - 23 mg/dL 37  34  23   Creatinine 0.61 - 1.24 mg/dL 3.97  3.86  3.75   Sodium 135 - 145 mmol/L 137  138  138   Potassium 3.5 - 5.1 mmol/L 3.4  4.0  3.5   Chloride 98 - 111 mmol/L 102  104  103   CO2 22 - 32 mmol/L _0 Calcium 8.9 - 10.3 mg/dL 9.2  9.4  9.3   Total Protein 6.5 - 8.1 g/dL 7.3  7.2  6.8   Total Bilirubin 0.3 - 1.2 mg/dL  0.6  0.8  0.7   Alkaline Phos 38 - 126 U/L 53  55  48   AST 15 - 41 U/L _0 ALT 0 - 44 U/L _1 DIAGNOSTIC IMAGING:  I have independently reviewed the scans and discussed with the patient. No results found.   ASSESSMENT:  1.  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  Basis for CKD is hypertension associated arteriosclerosis with arterionephrosclerosis. - Bone marrow biopsy on 02/21/2021 with slightly hypercellular bone marrow with trilineage hematopoiesis.  Plasma cells increased in number representing 11% of all cells with kappa light chain restriction. - Myeloma FISH panel with no evidence of T p53.  Plasma cell enrichment yielded limited cellularity, therefore only tested for T p53 probes at. - Chromosome analysis 45, X,- Y (8)/46, XY (12) - Labs on 02/01/2021-M spike 2 g, kappa light chain 794, free light  chain ratio 49.97.  Beta-2 microglobulin 5.5, albumin 3.5.  LDH normal. - PET scan on 03/08/2021 with no evidence of active myeloma.  No soft tissue plasmacytoma. - 24-hour urine total protein 2.2 g on 03/13/2021.  Immunofixation positive for IgG kappa. - 4 cycles of CyBorD from 03/14/2021 through 07/04/2021 held due to fluid retention.  Myeloma labs on 08/08/2021 showed progression. - Daratumumab, Velcade and dexamethasone started on 08/30/2021.   2.  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  Smoked 2 packs/day for 32 years and quit smoking cigarettes and vaping now. -Mother had small cell lung cancer.   3.  CKD: -He has stage V CKD thought to be secondary to hypertension. -Renal ultrasound on 12/19/2020 was negative for obstructive uropathy and findings were consistent with chronic medical renal disease.  Bilateral kidney cysts were seen. -He has 2.7 g of proteinuria on 24-hour urine. -Kidney biopsy was done on 01/25/2021.   PLAN:  1.  Stage II, standard risk IgG kappa multiple myeloma: - Reviewed myeloma labs from 06/27/2022.  M spike is stable at 0.2 g. - Free light chain ratio has improved to 3.2-1 downtrending consistently.  Kappa light chains of 38.6.  CBC shows mild thrombocytopenia.  LFTs are normal.  Calcium was normal. - Proceed with monthly Darzalex and Velcade subcu every 2 weeks. - I plan to see him back in 2 months for follow-up.  I plan to repeat myeloma labs along with DIRA prior to next visit.   2.  Smoking history: - Last CT chest on 02/15/2021 was lung RADS 1.   3.  ID prophylaxis: - Continue aspirin 81 mg daily.  Continue acyclovir 200 mg twice daily.   4.  Myeloma bone disease: - Holding denosumab since dialysis started.  5.  Lower extremity swelling: - Continue furosemide twice daily.  6.  ESRD on HD: - Peritoneal dialysis started on 05/15/2022 ongoing well.   Orders placed this encounter:  No orders of the defined types were  placed in this encounter.    Derek Jack, MD [facility] [phone]

## 2022-07-05 DIAGNOSIS — Z992 Dependence on renal dialysis: Secondary | ICD-10-CM | POA: Diagnosis not present

## 2022-07-05 DIAGNOSIS — N186 End stage renal disease: Secondary | ICD-10-CM | POA: Diagnosis not present

## 2022-07-07 ENCOUNTER — Emergency Department (HOSPITAL_COMMUNITY)
Admission: EM | Admit: 2022-07-07 | Discharge: 2022-07-07 | Disposition: A | Discharge status: Home / Self Care | Payer: Medicare HMO | Source: Home / Self Care | Attending: Emergency Medicine | Admitting: Emergency Medicine

## 2022-07-07 ENCOUNTER — Other Ambulatory Visit: Payer: Self-pay

## 2022-07-07 ENCOUNTER — Encounter (HOSPITAL_COMMUNITY): Payer: Self-pay | Admitting: *Deleted

## 2022-07-07 DIAGNOSIS — Z8579 Personal history of other malignant neoplasms of lymphoid, hematopoietic and related tissues: Secondary | ICD-10-CM | POA: Insufficient documentation

## 2022-07-07 DIAGNOSIS — J449 Chronic obstructive pulmonary disease, unspecified: Secondary | ICD-10-CM | POA: Insufficient documentation

## 2022-07-07 DIAGNOSIS — I129 Hypertensive chronic kidney disease with stage 1 through stage 4 chronic kidney disease, or unspecified chronic kidney disease: Secondary | ICD-10-CM | POA: Insufficient documentation

## 2022-07-07 DIAGNOSIS — Z79899 Other long term (current) drug therapy: Secondary | ICD-10-CM | POA: Insufficient documentation

## 2022-07-07 DIAGNOSIS — Z7982 Long term (current) use of aspirin: Secondary | ICD-10-CM | POA: Insufficient documentation

## 2022-07-07 DIAGNOSIS — N186 End stage renal disease: Secondary | ICD-10-CM | POA: Diagnosis not present

## 2022-07-07 DIAGNOSIS — H5461 Unqualified visual loss, right eye, normal vision left eye: Secondary | ICD-10-CM | POA: Diagnosis not present

## 2022-07-07 DIAGNOSIS — H53131 Sudden visual loss, right eye: Secondary | ICD-10-CM

## 2022-07-07 DIAGNOSIS — N189 Chronic kidney disease, unspecified: Secondary | ICD-10-CM | POA: Insufficient documentation

## 2022-07-07 DIAGNOSIS — Z992 Dependence on renal dialysis: Secondary | ICD-10-CM | POA: Diagnosis not present

## 2022-07-07 DIAGNOSIS — H44001 Unspecified purulent endophthalmitis, right eye: Secondary | ICD-10-CM | POA: Diagnosis not present

## 2022-07-07 LAB — COMPREHENSIVE METABOLIC PANEL
ALT: 22 U/L (ref 0–44)
AST: 15 U/L (ref 15–41)
Albumin: 3.7 g/dL (ref 3.5–5.0)
Alkaline Phosphatase: 51 U/L (ref 38–126)
Anion gap: 9 (ref 5–15)
BUN: 39 mg/dL — ABNORMAL HIGH (ref 8–23)
CO2: 22 mmol/L (ref 22–32)
Calcium: 8.8 mg/dL — ABNORMAL LOW (ref 8.9–10.3)
Chloride: 103 mmol/L (ref 98–111)
Creatinine, Ser: 3.99 mg/dL — ABNORMAL HIGH (ref 0.61–1.24)
GFR, Estimated: 16 mL/min — ABNORMAL LOW (ref >60)
Glucose, Bld: 109 mg/dL — ABNORMAL HIGH (ref 70–99)
Potassium: 3.5 mmol/L (ref 3.5–5.1)
Sodium: 134 mmol/L — ABNORMAL LOW (ref 135–145)
Total Bilirubin: 0.8 mg/dL (ref 0.3–1.2)
Total Protein: 6.6 g/dL (ref 6.5–8.1)

## 2022-07-07 LAB — CBC WITH DIFFERENTIAL/PLATELET
Abs Immature Granulocytes: 0.02 10*3/uL (ref 0.00–0.07)
Basophils Absolute: 0 10*3/uL (ref 0.0–0.1)
Basophils Relative: 1 %
Eosinophils Absolute: 0.1 10*3/uL (ref 0.0–0.5)
Eosinophils Relative: 1 %
HCT: 33.8 % — ABNORMAL LOW (ref 39.0–52.0)
Hemoglobin: 11.9 g/dL — ABNORMAL LOW (ref 13.0–17.0)
Immature Granulocytes: 1 %
Lymphocytes Relative: 54 %
Lymphs Abs: 2 10*3/uL (ref 0.7–4.0)
MCH: 35 pg — ABNORMAL HIGH (ref 26.0–34.0)
MCHC: 35.2 g/dL (ref 30.0–36.0)
MCV: 99.4 fL (ref 80.0–100.0)
Monocytes Absolute: 0.2 10*3/uL (ref 0.1–1.0)
Monocytes Relative: 7 %
Neutro Abs: 1.3 10*3/uL — ABNORMAL LOW (ref 1.7–7.7)
Neutrophils Relative %: 36 %
Platelets: 101 10*3/uL — ABNORMAL LOW (ref 150–400)
RBC: 3.4 MIL/uL — ABNORMAL LOW (ref 4.22–5.81)
RDW: 16.8 % — ABNORMAL HIGH (ref 11.5–15.5)
WBC Morphology: ABNORMAL
WBC: 3.7 10*3/uL — ABNORMAL LOW (ref 4.0–10.5)
nRBC: 0 % (ref 0.0–0.2)

## 2022-07-07 LAB — PROTIME-INR
INR: 1 (ref 0.8–1.2)
Prothrombin Time: 13.2 seconds (ref 11.4–15.2)

## 2022-07-07 LAB — CBG MONITORING, ED: Glucose-Capillary: 111 mg/dL — ABNORMAL HIGH (ref 70–99)

## 2022-07-07 LAB — APTT: aPTT: 26 seconds (ref 24–36)

## 2022-07-07 MED ORDER — OXYCODONE-ACETAMINOPHEN 5-325 MG PO TABS
1.0000 | ORAL_TABLET | Freq: Once | ORAL | Status: AC
  Administered 2022-07-07: 1 via ORAL
  Filled 2022-07-07: qty 1

## 2022-07-07 MED ORDER — FENTANYL CITRATE PF 50 MCG/ML IJ SOSY
50.0000 ug | PREFILLED_SYRINGE | Freq: Once | INTRAMUSCULAR | Status: DC
  Filled 2022-07-07: qty 1

## 2022-07-07 NOTE — ED Triage Notes (Signed)
Pt with c/o HA this morning, unable to state when.  Took tylenol for HA.  Pt with redness to right eye, states it's like a film over his eye for past 6 hours per pt. Hx of CVA -mild left sided weakness from old CVA.

## 2022-07-07 NOTE — ED Provider Notes (Signed)
[facility] EMERGENCY DEPARTMENT Provider Note   CSN: [id number] Arrival date & time: 07/07/22  1833     History  Chief Complaint  Patient presents with   Headache    Ernest Barnett is a 66 y.o. male. With past medical history of CVA, CKD, COPD, GERD, HTN, hypercholesterolemia, multiple myeloma who presents to the emergency department with headache.   States beginning around 11 AM this morning he has had worsening blurry vision and pain to the right eye.  States that it feels like a "film" over his right eye.  He has associated headache that is temporal on the right side.  He denies photophobia.  Has not had any nausea or vomiting.  He denies any recent head trauma.  Denies numbness or tingling to the face, numbness or tingling or weakness to the extremities, change in phonation.  He has previous retinal detachment of the left eye and is seen by Dr. Zadie Rhine.   Headache      Home Medications Prior to Admission medications   Medication Sig Start Date End Date Taking? Authorizing Provider  acyclovir (ZOVIRAX) 200 MG capsule Take 1 capsule (200 mg total) by mouth 2 (two) times daily. 03/28/22   Derek Jack, MD  albuterol (VENTOLIN HFA) 108 (90 Base) MCG/ACT inhaler Inhale 2 puffs into the lungs every 6 (six) hours as needed for wheezing or shortness of breath. 05/09/22   Lindell Spar, MD  aspirin EC 81 MG tablet Take 1 tablet (81 mg total) by mouth daily. 05/13/20   Rehman, Mechele Dawley, MD  bortezomib IV (VELCADE) 3.5 MG injection Inject 1.5 mg/m2 into the vein once a week. 03/14/21   [provider]  budesonide-formoterol (SYMBICORT) 80-4.5 MCG/ACT inhaler Inhale 2 puffs into the lungs 2 (two) times daily as needed (shortness of breath). 05/09/22   Lindell Spar, MD  dexamethasone (DECADRON) 4 MG tablet Take 2.5 tablets (10 mg total) by mouth once a week. Take at home along with Tylenol and Benadryl weekly on the day of daratumumab injection. 10/04/21   Derek Jack,  MD  docusate sodium (COLACE) 100 MG capsule Take 100 mg by mouth daily.    [provider]  gabapentin (NEURONTIN) 300 MG capsule Take 1 capsule by mouth twice daily 02/25/22   Derek Jack, MD  HYDROcodone-acetaminophen (NORCO/VICODIN) 5-325 MG tablet Take by mouth. 04/15/22   [provider]  midodrine (PROAMATINE) 10 MG tablet Take by mouth. 09/20/21   [provider]  multivitamin (RENA-VIT) TABS tablet Take 1 tablet by mouth daily. 05/13/22   [provider]  Omega-3 Fatty Acids (FISH OIL) 1000 MG CAPS Take 1,000 mg by mouth in the morning, at noon, and at bedtime.     [provider]  oxyCODONE (OXY IR/ROXICODONE) 5 MG immediate release tablet Take 1 tablet (5 mg total) by mouth 2 (two) times daily as needed for severe pain. 09/11/21   Derek Jack, MD  pantoprazole (PROTONIX) 40 MG tablet Take 1 tablet (40 mg total) by mouth daily. 05/09/22   Lindell Spar, MD  polyethylene glycol [facility] Floria Raveling) packet Take 17 g by mouth daily. Patient states that he takes as needed. 03/31/18   Rogene Houston, MD  pravastatin (PRAVACHOL) 80 MG tablet Take 1 tablet (80 mg total) by mouth daily. 06/25/21   Noreene Larsson, NP  sildenafil (REVATIO) 20 MG tablet Take 1 tablet (20 mg total) by mouth 5 (five) times daily as needed. 10/23/21   Noreene Larsson,  NP  tamsulosin (FLOMAX) 0.4 MG CAPS capsule Take 1 capsule by mouth once daily 05/20/22   Lindell Spar, MD  tiZANidine (ZANAFLEX) 2 MG tablet TAKE 1 TABLET BY MOUTH EVERY 6 HOURS AS NEEDED FOR MUSCLE SPASM 11/01/21   Noreene Larsson, NP  torsemide (DEMADEX) 100 MG tablet Take 100 mg by mouth 2 (two) times daily. 07/27/21   [provider]      Allergies    Patient has no known allergies.    Review of Systems   Review of Systems  Neurological:  Positive for headaches.    Physical Exam Updated Vital Signs BP 107/87   Pulse 100   Resp 15   Ht '5\' 7"'  (1.702 m)   Wt 115.2 kg   SpO2  98%   BMI 39.78 kg/m  Physical Exam Vitals and nursing note reviewed.  Constitutional:      Appearance: He is well-developed. He is obese. He is ill-appearing.  HENT:     Head: Normocephalic.     Mouth/Throat:     Mouth: Mucous membranes are moist.     Pharynx: Oropharynx is clear.  Eyes:     General: Visual field deficit present.        Right eye: Discharge present.     Intraocular pressure: Right eye pressure is 32 mmHg.     Extraocular Movements: Extraocular movements intact.     Right eye: Normal extraocular motion.     Left eye: Normal extraocular motion.     Conjunctiva/sclera:     Right eye: Right conjunctiva is injected.     Comments: Right eye is injected with clear tearing and tender to palpation. Used tetracaine to help numb the eye.  Improved symptoms.  Right eye IOP 32 The pupil is mildly larger than the left pupil Unable to perform thorough funduscopic exam Bedside ultrasound shows no clear retinal detachment No photophobia  Cardiovascular:     Rate and Rhythm: Regular rhythm. Tachycardia present.     Heart sounds: Normal heart sounds. No murmur heard. Pulmonary:     Effort: Pulmonary effort is normal. No respiratory distress.  Abdominal:     General: There is no distension.  Neurological:     Mental Status: He is alert and oriented to person, place, and time.     Cranial Nerves: No cranial nerve deficit, dysarthria or facial asymmetry.     Sensory: No sensory deficit.     Motor: No weakness.  Psychiatric:        Mood and Affect: Mood normal.        Speech: Speech normal.        Behavior: Behavior normal.    ED Results / Procedures / Treatments   Labs (all labs ordered are listed, but only abnormal results are displayed) Labs Reviewed  COMPREHENSIVE METABOLIC PANEL - Abnormal; Notable for the following components:      Result Value   Sodium 134 (*)    Glucose, Bld 109 (*)    BUN 39 (*)    Creatinine, Ser 3.99 (*)    Calcium 8.8 (*)    GFR,  Estimated 16 (*)    All other components within normal limits  CBC WITH DIFFERENTIAL/PLATELET - Abnormal; Notable for the following components:   WBC 3.7 (*)    RBC 3.40 (*)    Hemoglobin 11.9 (*)    HCT 33.8 (*)    MCH 35.0 (*)    RDW 16.8 (*)    Platelets 101 (*)  Neutro Abs 1.3 (*)    All other components within normal limits  CBG MONITORING, ED - Abnormal; Notable for the following components:   Glucose-Capillary 111 (*)    All other components within normal limits  PROTIME-INR  APTT   EKG None  Radiology No results found.  Procedures Procedures   Medications Ordered in ED Medications  oxyCODONE-acetaminophen (PERCOCET/ROXICET) 5-325 MG per tablet 1 tablet (1 tablet Oral Given 07/07/22 2050)    ED Course/ Medical Decision Making/ A&P                           Medical Decision Making Amount and/or Complexity of Data Reviewed Labs: ordered.  Risk Prescription drug management.  This patient presents to the ED with chief complaint(s) of right eye pain with pertinent past medical history of left retinal detachment CVA, CKD, COPD, hypertension, multiple myeloma which further complicates the presenting complaint. The complaint involves an extensive differential diagnosis and also carries with it a high risk of complications and morbidity.    The differential diagnosis includes optic neuritis, temporal arteritis, acute angle-closure glaucoma, endophthalmitis, uveitis, stroke  Additional history obtained: Additional history obtained from spouse Records reviewed Care Everywhere/External Records  ED Course and Reassessment: 66 year old male who presents to the emergency department with right eye pain, vision loss.  Nontraumatic. On physical exam the conjunctiva is injected, clear tearing, the cornea appears clear without haziness or fogginess.  Immediately performed IOP which was 29 and 32, elevated.  We also performed a bedside ultrasound with Dr. Alvino Chapel, ED attending  which showed no obvious retinal detachment.  No foreign body sensation or foreign body on exam so doubt corneal abrasion or ulcer.  No recent eye trauma to suspect microtrauma.  He has no significant photophobia which decreases my suspicion for endophthalmitis. Consulted and spoke with ophthalmology.  Initially spoke with Dr. Prudencio Burly who recommended that I call Dr. Zadie Rhine, the patient's ophthalmologist.  We attempted to do this but they do not have someone on call with hospital privileges.  I then talked to Dr. Prudencio Burly again.  I described the patient's symptoms at bedside with him on the phone.  He also had the opportunity to ask the patient questions on the phone.  He recommends that the patient be sent directly to his office from the emergency department.  Does not think that the patient needs a CTA of the head or neck.  The family is agreeable to this.  This is the best course of treatment for the patient.  We will provide him with oral pain medication prior to discharge.  He and wife understand that they need to go directly to his office from the emergency department.  I provided them with the address.  Per Dr. Prudencio Burly, I have also provided them with his personal cell number for them to call when they are about 10 minutes away.  They verbalized understanding.  Also discussed with attending, Dr. Alvino Chapel who agrees with the plan of care.  Patient will be discharged at this time for ophthalmology evaluation.  Independent labs interpretation:  The following labs were independently interpreted: CBC with decreased white count, hemoglobin and platelets.  He has a history of multiple myeloma. Creatinine 3.99, stable  Independent visualization of imaging: - I independently visualized the following imaging with scope of interpretation limited to determining acute life threatening conditions related to emergency care: N/A  Consultation: - Consulted or discussed management/test interpretation w/ external professional:  ophthalmology Dr.  Katy Apo  Consideration for admission or further workup: considered CTA head neck, per ophthalmology does not recommend Social Determinants of health: none identified Final Clinical Impression(s) / ED Diagnoses Final diagnoses:  Sudden visual loss of right eye    Rx / DC Orders ED Discharge Orders     None         Mickie Hillier, PA-C 07/08/22 1458    Davonna Belling, MD 07/08/22 1537

## 2022-07-07 NOTE — ED Notes (Signed)
Went over [employer] with pt and wife. All questions answered. Unable to get new vitals on patient due to removing self from monitor. Wheeled to lobby .

## 2022-07-07 NOTE — Discharge Instructions (Addendum)
You were seen at [facility] ED for vision loss in your right eye. We have contacted the ophthalmologist and he is recommended that you be seen this evening at his clinic. His name is Dr. Katy Apo. You will be going to [place], [place], [place] [address] straight from the emergency department to be evaluated by him. His cell phone number is [phone].  Please call him when you are 10 minutes away from his office.  He will meet you there.

## 2022-07-07 NOTE — ED Notes (Signed)
ED Provider at bedside. 

## 2022-07-08 ENCOUNTER — Other Ambulatory Visit: Payer: Self-pay | Admitting: *Deleted

## 2022-07-08 DIAGNOSIS — C9000 Multiple myeloma not having achieved remission: Secondary | ICD-10-CM

## 2022-07-08 DIAGNOSIS — Z992 Dependence on renal dialysis: Secondary | ICD-10-CM | POA: Diagnosis not present

## 2022-07-08 DIAGNOSIS — N186 End stage renal disease: Secondary | ICD-10-CM | POA: Diagnosis not present

## 2022-07-08 DIAGNOSIS — H40051 Ocular hypertension, right eye: Secondary | ICD-10-CM | POA: Diagnosis not present

## 2022-07-09 ENCOUNTER — Emergency Department (HOSPITAL_COMMUNITY): Payer: Medicare HMO

## 2022-07-09 ENCOUNTER — Encounter (HOSPITAL_COMMUNITY): Payer: Self-pay

## 2022-07-09 ENCOUNTER — Encounter (INDEPENDENT_AMBULATORY_CARE_PROVIDER_SITE_OTHER): Payer: Self-pay | Admitting: Ophthalmology

## 2022-07-09 ENCOUNTER — Encounter (INDEPENDENT_AMBULATORY_CARE_PROVIDER_SITE_OTHER): Payer: Medicare HMO | Admitting: Ophthalmology

## 2022-07-09 ENCOUNTER — Ambulatory Visit (INDEPENDENT_AMBULATORY_CARE_PROVIDER_SITE_OTHER): Payer: Medicare HMO | Admitting: Ophthalmology

## 2022-07-09 ENCOUNTER — Other Ambulatory Visit: Payer: Self-pay

## 2022-07-09 ENCOUNTER — Inpatient Hospital Stay (HOSPITAL_COMMUNITY)
Admission: EM | Admit: 2022-07-09 | Discharge: 2022-07-10 | DRG: 121 | Disposition: A | Discharge status: Other Acute Inpatient Hospital | Payer: Medicare HMO | Attending: Internal Medicine | Admitting: Internal Medicine

## 2022-07-09 DIAGNOSIS — C9000 Multiple myeloma not having achieved remission: Secondary | ICD-10-CM | POA: Diagnosis present

## 2022-07-09 DIAGNOSIS — H05011 Cellulitis of right orbit: Secondary | ICD-10-CM

## 2022-07-09 DIAGNOSIS — Z961 Presence of intraocular lens: Secondary | ICD-10-CM | POA: Diagnosis present

## 2022-07-09 DIAGNOSIS — H4419 Other endophthalmitis: Secondary | ICD-10-CM | POA: Diagnosis not present

## 2022-07-09 DIAGNOSIS — E559 Vitamin D deficiency, unspecified: Secondary | ICD-10-CM | POA: Diagnosis present

## 2022-07-09 DIAGNOSIS — K219 Gastro-esophageal reflux disease without esophagitis: Secondary | ICD-10-CM | POA: Diagnosis not present

## 2022-07-09 DIAGNOSIS — Z992 Dependence on renal dialysis: Secondary | ICD-10-CM | POA: Diagnosis not present

## 2022-07-09 DIAGNOSIS — N186 End stage renal disease: Secondary | ICD-10-CM

## 2022-07-09 DIAGNOSIS — Z66 Do not resuscitate: Secondary | ICD-10-CM | POA: Diagnosis not present

## 2022-07-09 DIAGNOSIS — D696 Thrombocytopenia, unspecified: Secondary | ICD-10-CM | POA: Diagnosis present

## 2022-07-09 DIAGNOSIS — I6523 Occlusion and stenosis of bilateral carotid arteries: Secondary | ICD-10-CM | POA: Diagnosis not present

## 2022-07-09 DIAGNOSIS — H4901 Third [oculomotor] nerve palsy, right eye: Secondary | ICD-10-CM | POA: Diagnosis not present

## 2022-07-09 DIAGNOSIS — H16031 Corneal ulcer with hypopyon, right eye: Secondary | ICD-10-CM

## 2022-07-09 DIAGNOSIS — J449 Chronic obstructive pulmonary disease, unspecified: Secondary | ICD-10-CM | POA: Diagnosis not present

## 2022-07-09 DIAGNOSIS — B9789 Other viral agents as the cause of diseases classified elsewhere: Secondary | ICD-10-CM | POA: Diagnosis not present

## 2022-07-09 DIAGNOSIS — F17290 Nicotine dependence, other tobacco product, uncomplicated: Secondary | ICD-10-CM | POA: Diagnosis not present

## 2022-07-09 DIAGNOSIS — H4921 Sixth [abducent] nerve palsy, right eye: Secondary | ICD-10-CM

## 2022-07-09 DIAGNOSIS — H1089 Other conjunctivitis: Secondary | ICD-10-CM | POA: Diagnosis not present

## 2022-07-09 DIAGNOSIS — H44001 Unspecified purulent endophthalmitis, right eye: Principal | ICD-10-CM

## 2022-07-09 DIAGNOSIS — H20051 Hypopyon, right eye: Secondary | ICD-10-CM

## 2022-07-09 DIAGNOSIS — G4489 Other headache syndrome: Secondary | ICD-10-CM | POA: Diagnosis not present

## 2022-07-09 DIAGNOSIS — Z9842 Cataract extraction status, left eye: Secondary | ICD-10-CM

## 2022-07-09 DIAGNOSIS — R609 Edema, unspecified: Secondary | ICD-10-CM | POA: Diagnosis not present

## 2022-07-09 DIAGNOSIS — Z79899 Other long term (current) drug therapy: Secondary | ICD-10-CM | POA: Diagnosis not present

## 2022-07-09 DIAGNOSIS — Z8673 Personal history of transient ischemic attack (TIA), and cerebral infarction without residual deficits: Secondary | ICD-10-CM

## 2022-07-09 DIAGNOSIS — H16001 Unspecified corneal ulcer, right eye: Secondary | ICD-10-CM | POA: Diagnosis not present

## 2022-07-09 DIAGNOSIS — H9190 Unspecified hearing loss, unspecified ear: Secondary | ICD-10-CM | POA: Diagnosis not present

## 2022-07-09 DIAGNOSIS — Z743 Need for continuous supervision: Secondary | ICD-10-CM | POA: Diagnosis not present

## 2022-07-09 DIAGNOSIS — H4051X0 Glaucoma secondary to other eye disorders, right eye, stage unspecified: Secondary | ICD-10-CM | POA: Diagnosis present

## 2022-07-09 DIAGNOSIS — I120 Hypertensive chronic kidney disease with stage 5 chronic kidney disease or end stage renal disease: Secondary | ICD-10-CM | POA: Diagnosis not present

## 2022-07-09 DIAGNOSIS — Z9841 Cataract extraction status, right eye: Secondary | ICD-10-CM

## 2022-07-09 DIAGNOSIS — H40051 Ocular hypertension, right eye: Secondary | ICD-10-CM | POA: Diagnosis not present

## 2022-07-09 DIAGNOSIS — Z7982 Long term (current) use of aspirin: Secondary | ICD-10-CM

## 2022-07-09 DIAGNOSIS — E7800 Pure hypercholesterolemia, unspecified: Secondary | ICD-10-CM | POA: Diagnosis not present

## 2022-07-09 LAB — COMPREHENSIVE METABOLIC PANEL
ALT: 21 U/L (ref 0–44)
AST: 14 U/L — ABNORMAL LOW (ref 15–41)
Albumin: 3.4 g/dL — ABNORMAL LOW (ref 3.5–5.0)
Alkaline Phosphatase: 54 U/L (ref 38–126)
Anion gap: 12 (ref 5–15)
BUN: 31 mg/dL — ABNORMAL HIGH (ref 8–23)
CO2: 19 mmol/L — ABNORMAL LOW (ref 22–32)
Calcium: 9.6 mg/dL (ref 8.9–10.3)
Chloride: 109 mmol/L (ref 98–111)
Creatinine, Ser: 3.61 mg/dL — ABNORMAL HIGH (ref 0.61–1.24)
GFR, Estimated: 18 mL/min — ABNORMAL LOW (ref >60)
Glucose, Bld: 127 mg/dL — ABNORMAL HIGH (ref 70–99)
Potassium: 3.5 mmol/L (ref 3.5–5.1)
Sodium: 140 mmol/L (ref 135–145)
Total Bilirubin: 0.6 mg/dL (ref 0.3–1.2)
Total Protein: 6.5 g/dL (ref 6.5–8.1)

## 2022-07-09 LAB — CBC
HCT: 36.4 % — ABNORMAL LOW (ref 39.0–52.0)
Hemoglobin: 12.5 g/dL — ABNORMAL LOW (ref 13.0–17.0)
MCH: 34.9 pg — ABNORMAL HIGH (ref 26.0–34.0)
MCHC: 34.3 g/dL (ref 30.0–36.0)
MCV: 101.7 fL — ABNORMAL HIGH (ref 80.0–100.0)
Platelets: 93 10*3/uL — ABNORMAL LOW (ref 150–400)
RBC: 3.58 MIL/uL — ABNORMAL LOW (ref 4.22–5.81)
RDW: 17.2 % — ABNORMAL HIGH (ref 11.5–15.5)
WBC: 5.6 10*3/uL (ref 4.0–10.5)
nRBC: 0.4 % — ABNORMAL HIGH (ref 0.0–0.2)

## 2022-07-09 LAB — LACTIC ACID, PLASMA: Lactic Acid, Venous: 0.8 mmol/L (ref 0.5–1.9)

## 2022-07-09 MED ORDER — GENTAMICIN FORTIFIED OPHTHALMIC SOLUTION
1.0000 [drp] | OPHTHALMIC | Status: DC
  Filled 2022-07-09: qty 7

## 2022-07-09 MED ORDER — VANCOMYCIN 25 MG/ML COMPOUNDED TOPICAL OPHTHALMIC SOLUTION
1.0000 [drp] | OPHTHALMIC | Status: DC
  Administered 2022-07-09: 1 [drp] via OPHTHALMIC
  Filled 2022-07-09: qty 10

## 2022-07-09 MED ORDER — VANCOMYCIN 25 MG/ML COMPOUNDED TOPICAL OPHTHALMIC SOLUTION
1.0000 [drp] | Freq: Four times a day (QID) | OPHTHALMIC | Status: DC
  Filled 2022-07-09 (×2): qty 10

## 2022-07-09 MED ORDER — GENTAMICIN FORTIFIED OPHTHALMIC SOLUTION
1.0000 [drp] | OPHTHALMIC | Status: DC
  Administered 2022-07-09: 1 [drp] via OPHTHALMIC
  Filled 2022-07-09: qty 5

## 2022-07-09 MED ORDER — METRONIDAZOLE 500 MG/100ML IV SOLN
500.0000 mg | Freq: Once | INTRAVENOUS | Status: AC
  Administered 2022-07-10: 500 mg via INTRAVENOUS
  Filled 2022-07-09: qty 100

## 2022-07-09 MED ORDER — DEXTROSE 5 % IV SOLN
1000.0000 mg | Freq: Once | INTRAVENOUS | Status: AC
Start: 2022-07-10 — End: 2022-07-10
  Administered 2022-07-10: 1000 mg via INTRAVENOUS
  Filled 2022-07-09: qty 20

## 2022-07-09 MED ORDER — VANCOMYCIN 25 MG/ML COMPOUNDED TOPICAL OPHTHALMIC SOLUTION
1.0000 [drp] | OPHTHALMIC | Status: DC
  Filled 2022-07-09: qty 10

## 2022-07-09 MED ORDER — VANCOMYCIN 25 MG/ML COMPOUNDED TOPICAL OPHTHALMIC SOLUTION: 1.0000 [drp] | OPHTHALMIC | Status: DC

## 2022-07-09 MED ORDER — IOHEXOL 350 MG/ML SOLN
75.0000 mL | Freq: Once | INTRAVENOUS | Status: AC | PRN
  Administered 2022-07-09: 75 mL via INTRAVENOUS

## 2022-07-09 MED ORDER — SODIUM CHLORIDE 0.9 % IV SOLN
2.0000 g | Freq: Once | INTRAVENOUS | Status: AC
  Administered 2022-07-09: 2 g via INTRAVENOUS
  Filled 2022-07-09: qty 20

## 2022-07-09 MED ORDER — HYDROMORPHONE HCL 1 MG/ML IJ SOLN: 0.5000 mg | Freq: Once | INTRAMUSCULAR | Status: DC

## 2022-07-09 MED ORDER — VANCOMYCIN HCL 2000 MG/400ML IV SOLN
2000.0000 mg | Freq: Once | INTRAVENOUS | Status: AC
  Administered 2022-07-09: 2000 mg via INTRAVENOUS
  Filled 2022-07-09: qty 400

## 2022-07-09 MED ORDER — VANCOMYCIN 25 MG/ML COMPOUNDED TOPICAL OPHTHALMIC SOLUTION
1.0000 [drp] | OPHTHALMIC | Status: DC
  Administered 2022-07-09 – 2022-07-10 (×13): 1 [drp] via OPHTHALMIC
  Filled 2022-07-09: qty 10

## 2022-07-09 MED ORDER — GENTAMICIN FORTIFIED OPHTHALMIC SOLUTION
1.0000 [drp] | OPHTHALMIC | Status: DC
  Administered 2022-07-09 – 2022-07-10 (×14): 1 [drp] via OPHTHALMIC
  Filled 2022-07-09: qty 7

## 2022-07-09 MED ORDER — VANCOMYCIN VARIABLE DOSE PER UNSTABLE RENAL FUNCTION (PHARMACIST DOSING): Status: DC

## 2022-07-09 NOTE — Assessment & Plan Note (Signed)
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

## 2022-07-09 NOTE — ED Triage Notes (Signed)
Pt arrived by [facility] from eye doctor, Sunday pt started having R eye drainage and swelling, this has been worsening over 2 days. C/o pain to head and R eye. Hx multiple myeloma and hx dialysis. Per pt and EMS, eye doctor said pressure in eye is pushing on nerves causing these symptoms and causing infection

## 2022-07-09 NOTE — ED Notes (Signed)
Eye drops given to pharmacy for cold storage in between doses.

## 2022-07-09 NOTE — Assessment & Plan Note (Signed)
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  And with a blindness thus optic nerve involvement with endophthalmitis as well.  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  Bacterial infection must be ruled out differential diagnosis would also include secondary zoster and/or infiltration from opportunistic infection  The orbital cone and soft tissues are taut with possible early exophthalmos signifying possible orbital cellulitis or orbital infiltration syndrome with multiple myeloma and/or endogenous endophthalmitis triggering orbital apex syndrome. 

## 2022-07-09 NOTE — Progress Notes (Addendum)
07/09/2022     CHIEF COMPLAINT Patient presents for  Chief Complaint  Patient presents with   Retina Evaluation      HISTORY OF PRESENT ILLNESS: Ernest Barnett is a 66 y.o. male who presents to the clinic today for:   HPI     Retina Evaluation           Laterality: right eye         Comments   OD with profound vision loss over the last 48 hours.  Cranial nerve #2 affected with elevated intraocular pressure but more importantly with no signs of vitritis no signs of deep endogenous endophthalmitis but with corneal keratitis and ulcerative corneal disease OD.  Found on examination here today    Diagnosis of multiple myeloma made October 2022.  Recent notes as treated but not in remission      Last edited by Edmon Crape, MD on 07/09/2022  2:59 PM.      Referring physician: Doreatha Massed, MD [address] [place],  [place] [address]  HISTORICAL INFORMATION:   Selected notes from the MEDICAL RECORD NUMBER       CURRENT MEDICATIONS: No current outpatient medications on file. (Ophthalmic Drugs)   No current facility-administered medications for this visit. (Ophthalmic Drugs)   Current Outpatient Medications (Other)  Medication Sig   acyclovir (ZOVIRAX) 200 MG capsule Take 1 capsule (200 mg total) by mouth 2 (two) times daily.   albuterol (VENTOLIN HFA) 108 (90 Base) MCG/ACT inhaler Inhale 2 puffs into the lungs every 6 (six) hours as needed for wheezing or shortness of breath.   aspirin EC 81 MG tablet Take 1 tablet (81 mg total) by mouth daily.   bortezomib IV (VELCADE) 3.5 MG injection Inject 1.5 mg/m2 into the vein once a week.   budesonide-formoterol (SYMBICORT) 80-4.5 MCG/ACT inhaler Inhale 2 puffs into the lungs 2 (two) times daily as needed (shortness of breath).   dexamethasone (DECADRON) 4 MG tablet Take 2.5 tablets (10 mg total) by mouth once a week. Take at home along with Tylenol and Benadryl weekly on the day of daratumumab injection.   docusate  sodium (COLACE) 100 MG capsule Take 100 mg by mouth daily.   gabapentin (NEURONTIN) 300 MG capsule Take 1 capsule by mouth twice daily   HYDROcodone-acetaminophen (NORCO/VICODIN) 5-325 MG tablet Take by mouth.   midodrine (PROAMATINE) 10 MG tablet Take by mouth.   multivitamin (RENA-VIT) TABS tablet Take 1 tablet by mouth daily.   Omega-3 Fatty Acids (FISH OIL) 1000 MG CAPS Take 1,000 mg by mouth in the morning, at noon, and at bedtime.    oxyCODONE (OXY IR/ROXICODONE) 5 MG immediate release tablet Take 1 tablet (5 mg total) by mouth 2 (two) times daily as needed for severe pain.   pantoprazole (PROTONIX) 40 MG tablet Take 1 tablet (40 mg total) by mouth daily.   polyethylene glycol (MIRALAX / GLYCOLAX) packet Take 17 g by mouth daily. Patient states that he takes as needed.   pravastatin (PRAVACHOL) 80 MG tablet Take 1 tablet (80 mg total) by mouth daily.   sildenafil (REVATIO) 20 MG tablet Take 1 tablet (20 mg total) by mouth 5 (five) times daily as needed.   tamsulosin (FLOMAX) 0.4 MG CAPS capsule Take 1 capsule by mouth once daily   tiZANidine (ZANAFLEX) 2 MG tablet TAKE 1 TABLET BY MOUTH EVERY 6 HOURS AS NEEDED FOR MUSCLE SPASM   torsemide (DEMADEX) 100 MG tablet Take 100 mg by mouth 2 (two)  times daily.   No current facility-administered medications for this visit. (Other)   Facility-Administered Medications Ordered in Other Visits (Other)  Medication Route   palonosetron (ALOXI) 0.25 MG/5ML injection       REVIEW OF SYSTEMS: ROS   Positive for: Heme/Lymph Last edited by Edmon Crape, MD on 07/09/2022  2:33 PM.       ALLERGIES No Known Allergies  PAST MEDICAL HISTORY Past Medical History:  Diagnosis Date   Chronic kidney disease    COPD (chronic obstructive pulmonary disease) (HCC)    CVA (cerebral vascular accident) (HCC) 2013   Cystoid macular edema of left eye 03/07/2021   GERD (gastroesophageal reflux disease)    HOH (hard of hearing)    Hypercholesteremia     Hypertension    Left epiretinal membrane 03/07/2021   Pseudophakia 03/07/2021   Stroke (HCC) 03/24/2012   left sided weakness   Vitamin D deficiency    Past Surgical History:  Procedure Laterality Date   A/V FISTULAGRAM Right 01/29/2022   Procedure: A/V Fistulagram;  Surgeon: Nada Libman, MD;  [facility]: [facility];  Service: Cardiovascular;  Laterality: Right;   AV FISTULA PLACEMENT Right 02/08/2021   Procedure: RIGHT ARM ARTERIOVENOUS (AV) FISTULA CREATION;  Surgeon: Larina Earthly, MD;  Location: [facility];  Service: Vascular;  Laterality: Right;   BIOPSY  11/12/2018   Procedure: BIOPSY;  Surgeon: Malissa Hippo, MD;  Location: [facility];  Service: Endoscopy;;  colon   BIOPSY  05/10/2020   Procedure: BIOPSY;  Surgeon: Malissa Hippo, MD;  [facility]: [facility];  Service: Endoscopy;;  esophagus   CATARACT EXTRACTION     right eye   CATARACT EXTRACTION W/PHACO  10/08/2012   Procedure: CATARACT EXTRACTION PHACO AND INTRAOCULAR LENS PLACEMENT (IOC);  Surgeon: Gemma Payor, MD;  Location: [facility];  Service: Ophthalmology;  Laterality: Left;  CDE:6.64   CHOLECYSTECTOMY     [facility]   COLONOSCOPY N/A 11/12/2018   Procedure: COLONOSCOPY;  Surgeon: Malissa Hippo, MD;  [facility]: [facility];  Service: Endoscopy;  Laterality: N/A;  1030   ELBOW FRACTURE SURGERY     left   ESOPHAGEAL DILATION N/A 11/12/2018   Procedure: ESOPHAGEAL DILATION;  Surgeon: Malissa Hippo, MD;  [facility];  Service: Endoscopy;  Laterality: N/A;   ESOPHAGEAL DILATION N/A 05/10/2020   Procedure: ESOPHAGEAL DILATION;  Surgeon: Malissa Hippo, MD;  [facility];  Service: Endoscopy;  Laterality: N/A;   ESOPHAGOGASTRODUODENOSCOPY N/A 11/12/2018   Procedure: ESOPHAGOGASTRODUODENOSCOPY (EGD);  Surgeon: Malissa Hippo, MD;  [facility];  Service: Endoscopy;  Laterality: N/A;   ESOPHAGOGASTRODUODENOSCOPY N/A 05/10/2020   Procedure: ESOPHAGOGASTRODUODENOSCOPY (EGD);  Surgeon: Malissa Hippo, MD;  [facility];  Service: Endoscopy;  Laterality: N/A;  210   HERNIA REPAIR     right inguinal   HYDROCELE EXCISION / REPAIR     IR FLUORO GUIDE CV LINE LEFT  08/17/2021   IR US GUIDE VASC ACCESS LEFT  08/17/2021   LIGATION OF COMPETING BRANCHES OF ARTERIOVENOUS FISTULA Right 09/11/2021   Procedure: LIGATION OF COMPETING BRANCHES OF RIGHT ARM ARTERIOVENOUS FISTULA;  Surgeon: Larina Earthly, MD;  Location: [facility];  Service: Vascular;  Laterality: Right;   PERIPHERAL VASCULAR BALLOON ANGIOPLASTY Right 01/29/2022   Procedure: PERIPHERAL VASCULAR BALLOON ANGIOPLASTY;  Surgeon: Nada Libman, MD;  Location: [facility];  Service: Cardiovascular;  Laterality: Right;  arm fistula   POLYPECTOMY  11/12/2018   Procedure: POLYPECTOMY;  Surgeon: Rogene Houston, MD;  [facility];  Service: Endoscopy;;  colon    RETINAL DETACHMENT SURGERY Left 2019   SPLENECTOMY, TOTAL     TONSILLECTOMY     VOCAL CORD INJECTION     removal of polyp-2005    FAMILY HISTORY No family history on file.  SOCIAL HISTORY Social History   Tobacco Use   Smoking status: Former    Packs/day: 3.00    Years: 30.00    Total pack years: 90.00    Types: Cigarettes   Smokeless tobacco: Current   Tobacco comments:    Currently vape  Vaping Use   Vaping Use: Every day   Substances: Nicotine  Substance Use Topics   Alcohol use: Yes    Alcohol/week: 1.0 - 2.0 standard drink of alcohol    Types: 1 - 2 Cans of beer per week    Comment: Occasionally   Drug use: No         OPHTHALMIC EXAM:  Base Eye Exam     Visual Acuity (ETDRS)       Right Left   Dist [place] NLP          Pupils       APD   Right +4   Left None         Visual Fields       Left Right   Restrictions  Total superior temporal, inferior temporal, superior nasal, inferior nasal deficiencies         Extraocular Movement       Right Left    Abnormal Full    -- -- --  --  --  -- -- --   -- -- --   --  --  -- -- --    OD with limited excursion superiorly, temporally, and inferiorly suggestive of a third and partial 6th nerve palsy.  The orbital cone and soft tissues are taut with possible early exophthalmos signifying possible orbital cellulitis or orbital infiltration syndrome with multiple myeloma and/or endogenous endophthalmitis triggering orbital apex syndrome.        Neuro/Psych     Oriented x3: Yes   Mood/Affect: Normal  Patient in pain with headache upon the right somewhat looks in pain distress          Slit Lamp and Fundus Exam     External Exam       Right Left   External Erythema of lids, and tender to touch          Slit Lamp Exam       Right Left   Lids/Lashes 3+ edema of the lids    Conjunctiva/Sclera Purulent discharge.  Feeder surface.    Cornea Hazy diffusely             IMAGING AND PROCEDURES  Imaging and Procedures for 07/09/22  B-Scan Ultrasound - OS - Left Eye       Quality was good.   Notes Optically empty vitreous cavity.  No retinal tears or detachment.  No obvious infiltrates or debris             ASSESSMENT/PLAN:  Orbital cellulitis on right OD with limited excursion superiorly, temporally, and inferiorly suggestive of a third and partial 6th nerve palsy.  And with a blindness thus optic nerve involvement with endophthalmitis as well.  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  Bacterial infection must be ruled  out differential diagnosis would also include secondary zoster and/or infiltration from opportunistic infection  The orbital cone and soft tissues are taut with possible early exophthalmos signifying possible orbital cellulitis or orbital infiltration syndrome with multiple myeloma and/or endogenous endophthalmitis triggering orbital apex syndrome.  Acute endophthalmitis, right Patient needs emergency evaluation at [facility] emergency room for orbital  cellulitis syndrome with ENT consult, hospitalist consult with neuroimaging, orbital imaging.  Not no light perception vision thus the right eye is not salvageable from a intraocular standpoint  Corneal infiltrates and in fact early corneal melt may be occurring inferiorly in the right eye.  Nonetheless some features iris are notable.  We will need to treat this as topical infection as well.  B-scan ultrasonography of the vitreous cavity is remarkably optically empty  Corneal ulcer with hypopyon, right eye Recommend cultures of the cornea surface in the right eye followed thereafter by empiric topical therapy including vancomycin fortified and topical ceftazidime fortified empirically.  VI nerve palsy, right Possible component of orbital apex syndrome or orbital cellulitis  3rd nerve palsy, partial, right For elevation and depression of the globe.  Intact abduction     ICD-10-CM   1. Orbital cellulitis on right  H05.011 B-Scan Ultrasound - OS - Left Eye    2. Acute endophthalmitis, right  H44.001 B-Scan Ultrasound - OS - Left Eye    3. Corneal ulcer with hypopyon, right eye  H16.031     4. VI nerve palsy, right  H49.21     5. 3rd nerve palsy, partial, right  H49.01       1.Emergency services contacted patient will be sent immediately to emergency room for neuro evaluation orbital evaluation look for endogenous endophthalmitis possible orbital cellulitis with secondary conjunctivitis/corneal keratitis of the right eye with no light perception vision.  2.  OD orbital cellulitis signs with congestion, proptosis of the globe apparent, poor abduction (VI nerve), poor elevation and poor depression, 3rd nerve, and with 2nd nerve involvement.  3.  Ulcerative corneal keratitis is also present yet no secondary spillover vitritis is noted on B-scan ultrasound today.  4.  I will enlist the help of Dr. Katy Apo for use of fortified topical medications likely vancomycin and separately  ceftazidime topically for the apparent secondary bacterial corneal ulcers duration noted anteriorly  [facility] arrived and transfer onto [place] Ordered this visit:  No orders of the defined types were placed in this encounter.      Return for To [facility] emergency room urgently via ambulance now.  Patient Instructions  Emergency services contacted patient will be sent immediately to emergency room for neuro evaluation orbital evaluation look for endogenous endophthalmitis possible orbital cellulitis with secondary conjunctivitis/corneal keratitis of the right eye with no light perception vision.  Explained the diagnoses, plan, and follow up with the patient and they expressed understanding.  Patient expressed understanding of the importance of proper follow up care.   Clent Demark Amaree Loisel M.D. Diseases & Surgery of the Retina and Vitreous Retina & Diabetic [place] 07/09/22     Abbreviations: M myopia (nearsighted); A astigmatism; H hyperopia (farsighted); P presbyopia; Mrx spectacle prescription;  CTL contact lenses; OD right eye; OS left eye; OU both eyes  XT exotropia; ET esotropia; PEK punctate epithelial keratitis; PEE punctate epithelial erosions; DES dry eye syndrome; MGD meibomian gland dysfunction; ATs artificial tears; PFAT's preservative free artificial tears; [place] nuclear sclerotic cataract; PSC posterior subcapsular cataract; ERM epi-retinal membrane; PVD posterior vitreous detachment;  RD retinal detachment; DM diabetes mellitus; DR diabetic retinopathy; NPDR non-proliferative diabetic retinopathy; PDR proliferative diabetic retinopathy; CSME clinically significant macular edema; DME diabetic macular edema; dbh dot blot hemorrhages; CWS cotton wool spot; POAG primary open angle glaucoma; C/D cup-to-disc ratio; HVF humphrey visual field; GVF goldmann visual field; OCT optical coherence tomography; IOP intraocular pressure; BRVO Branch retinal vein  occlusion; CRVO central retinal vein occlusion; CRAO central retinal artery occlusion; BRAO branch retinal artery occlusion; RT retinal tear; SB scleral buckle; PPV pars plana vitrectomy; VH Vitreous hemorrhage; PRP panretinal laser photocoagulation; IVK intravitreal kenalog; VMT vitreomacular traction; MH Macular hole;  NVD neovascularization of the disc; NVE neovascularization elsewhere; AREDS age related eye disease study; ARMD age related macular degeneration; POAG primary open angle glaucoma; EBMD epithelial/anterior basement membrane dystrophy; ACIOL anterior chamber intraocular lens; IOL intraocular lens; PCIOL posterior chamber intraocular lens; Phaco/IOL phacoemulsification with intraocular lens placement; [place] photorefractive keratectomy; LASIK laser assisted in situ keratomileusis; HTN hypertension; DM diabetes mellitus; COPD chronic obstructive pulmonary disease

## 2022-07-09 NOTE — Assessment & Plan Note (Signed)
Recommend cultures of the cornea surface in the right eye followed thereafter by empiric topical therapy including vancomycin fortified and topical ceftazidime fortified empirically.

## 2022-07-09 NOTE — ED Notes (Signed)
Pt off floor for MRI

## 2022-07-09 NOTE — Progress Notes (Signed)
Pharmacy Antibiotic Note  Ernest Barnett is a 66 y.o. male for which pharmacy has been consulted for vancomycin dosing for  Acute endophthalmitis, right, & Orbital cellulitis on right .  Patient with a history of esrd/peritoneal dialysis, multiple myeloma. Patient presenting with unable to see out of right eye for past two days.   Patient is receiving abx eye drops (gentamicin and vancomycin) per ophthalmology recommendations.  Plan: Metronidazole per MD Ceftriaxone per MD Vancomycin 2000 mg once, subsequent dosing as indicated per random vancomycin level pending nephrology plan for patient's PD Trend WBC, Fever, Renal function, & Clinical course F/u cultures, clinical course, WBC, fever De-escalate when able  Height: '5\' 7"'  (170.2 cm) Weight: 115.2 kg (254 lb) IBW/kg (Calculated) : 66.1  Temp (24hrs), Avg:98 F (36.7 C), Min:98 F (36.7 C), Max:98 F (36.7 C)  Recent Labs  Lab 07/07/22 1851  WBC 3.7*  CREATININE 3.99*    Estimated Creatinine Clearance: 22.4 mL/min (A) (by C-G formula based on SCr of 3.99 mg/dL (H)).    No Known Allergies  Antimicrobials this admission: vancomycin 9/5 >>  rocephin 9/5 >>  flagyl 9/5 >>   Microbiology results: Pending  Thank you for allowing pharmacy to be a part of this patient's care.  Lorelei Pont, PharmD, BCPS 07/09/2022 5:08 PM ED Clinical Pharmacist -  [phone]

## 2022-07-09 NOTE — Assessment & Plan Note (Signed)
Patient needs emergency evaluation at [facility] emergency room for orbital cellulitis syndrome with ENT consult, hospitalist consult with neuroimaging, orbital imaging.  Not no light perception vision thus the right eye is not salvageable from a intraocular standpoint  Corneal infiltrates and in fact early corneal melt may be occurring inferiorly in the right eye.  Nonetheless some features iris are notable.  We will need to treat this as topical infection as well.  B-scan ultrasonography of the vitreous cavity is remarkably optically empty

## 2022-07-09 NOTE — H&P (Signed)
History and Physical    Ernest Barnett RDE:[id number] DOB: [birth year] DOA: 07/09/2022  PCP: Lindell Spar, MD  Patient coming from: Home  I have personally briefly reviewed patient's old medical records in [place]  Chief Complaint: Right eye infection with vision loss  HPI: Ernest Barnett is a 66 y.o. male with medical history significant for ESRD on PD, IgG kappa multiple myeloma on active therapy, COPD, history of CVA, HLD, thrombocytopenia, hard of hearing who presented to the ED for evaluation of right eye infection and vision loss.  Patient was initially seen in the ED on 9/3 for right eye vision change with pain and erythema.  Mildly increased right eye intraocular pressure noted, bedside ultrasound did not show detachment.  He was arranged to see ophthalmology, Dr. Prudencio Burly, in the outpatient setting.  Patient did not see ophthalmology until afternoon 9/5.  He was found to have acute right-sided endophthalmitis with orbital cellulitis, corneal ulcer, 6th nerve palsy and partial 3rd nerve palsy.  EMS were contacted and he was sent to the ED for further evaluation and management.  He has significant erythema and purulence of the right eye with vision loss.  He otherwise has no significant complaints.  He is on home peritoneal dialysis.  He reports good urine output.  He denies any chest pain, dyspnea.  ED Course  Labs/Imaging on admission: I have personally reviewed following labs and imaging studies.  Initial vitals showed BP 118/84, pulse 93, RR 18, temp 98.0 F, SPO2 94% on room air.  Labs show WBC 5.6, hemoglobin 12.5, platelets 93,000, lactic acid 0.8, sodium 140, potassium 3.5, bicarb 19, BUN 31, creatinine 3.61, serum glucose 127.  Eye culture in process, Gram stain shows few WBCs, rare gram-positive rods.  MRI unable to be performed due to metal piece associated with PD catheter that is not MRI compatible.  CT orbits with contrast showed right preseptal cellulitis  and early orbital cellulitis.  CTA head/neck and venogram: 1. No intracranial large vessel occlusion. Moderate stenosis in the left M1 and mild multifocal narrowing in the bilateral PCAs. 2. Fusiform dilatation of the right cavernous carotid, measuring up to 8 mm 3. No hemodynamically significant stenosis in the neck. 4. No acute intracranial process. 5. Abduction of the right vocal fold with medial ization of the left vocal fold, concerning for right vocal Ernest paralysis. 6. No evidence of intracranial venous sinus thrombosis or stenosis  Ophthalmology, Dr. Prudencio Burly, consulted.  Patient given IV vancomycin, ceftriaxone, Flagyl and topical vancomycin and gentamicin.  Also given IV acyclovir.  The hospitalist service was consulted to admit for further evaluation management.  Review of Systems: All systems reviewed and are negative except as documented in history of present illness above.   Past Medical History:  Diagnosis Date   Chronic kidney disease    COPD (chronic obstructive pulmonary disease) (HCC)    CVA (cerebral vascular accident) ([place]) 2013   Cystoid macular edema of left eye 03/07/2021   GERD (gastroesophageal reflux disease)    HOH (hard of hearing)    Hypercholesteremia    Hypertension    Left epiretinal membrane 03/07/2021   Pseudophakia 03/07/2021   Stroke ([place]) 03/24/2012   left sided weakness   Vitamin D deficiency     Past Surgical History:  Procedure Laterality Date   A/V FISTULAGRAM Right 01/29/2022   Procedure: A/V Fistulagram;  Surgeon: Serafina Mitchell, MD;  Location: [facility];  Service: Cardiovascular;  Laterality: Right;   AV  FISTULA PLACEMENT Right 02/08/2021   Procedure: RIGHT ARM ARTERIOVENOUS (AV) FISTULA CREATION;  Surgeon: [name], MD;  Location: [facility];  Service: Vascular;  Laterality: Right;   BIOPSY  11/12/2018   Procedure: BIOPSY;  Surgeon: Rogene Houston, MD;  [facility];  Service: Endoscopy;;  colon   BIOPSY  05/10/2020    Procedure: BIOPSY;  Surgeon: Rogene Houston, MD;  [facility];  Service: Endoscopy;;  esophagus   CATARACT EXTRACTION     right eye   CATARACT EXTRACTION [facility]  10/08/2012   Procedure: CATARACT EXTRACTION PHACO AND INTRAOCULAR LENS PLACEMENT ([place]);  Surgeon: Tonny Branch, MD;  Location: [facility];  Service: Ophthalmology;  Laterality: Left;  CDE:6.64   CHOLECYSTECTOMY     [place]   COLONOSCOPY N/A 11/12/2018   Procedure: COLONOSCOPY;  Surgeon: Rogene Houston, MD;  [facility];  Service: Endoscopy;  Laterality: N/A;  1030   ELBOW FRACTURE SURGERY     left   ESOPHAGEAL DILATION N/A 11/12/2018   Procedure: ESOPHAGEAL DILATION;  Surgeon: Rogene Houston, MD;  [facility];  Service: Endoscopy;  Laterality: N/A;   ESOPHAGEAL DILATION N/A 05/10/2020   Procedure: ESOPHAGEAL DILATION;  Surgeon: Rogene Houston, MD;  [facility];  Service: Endoscopy;  Laterality: N/A;   ESOPHAGOGASTRODUODENOSCOPY N/A 11/12/2018   Procedure: ESOPHAGOGASTRODUODENOSCOPY (EGD);  Surgeon: Rogene Houston, MD;  [facility];  Service: Endoscopy;  Laterality: N/A;   ESOPHAGOGASTRODUODENOSCOPY N/A 05/10/2020   Procedure: ESOPHAGOGASTRODUODENOSCOPY (EGD);  Surgeon: Rogene Houston, MD;  [facility];  Service: Endoscopy;  Laterality: N/A;  210   HERNIA REPAIR     right inguinal   HYDROCELE EXCISION / REPAIR     IR FLUORO GUIDE CV LINE LEFT  08/17/2021   IR US GUIDE VASC ACCESS LEFT  08/17/2021   LIGATION OF COMPETING BRANCHES OF ARTERIOVENOUS FISTULA Right 09/11/2021   Procedure: LIGATION OF COMPETING BRANCHES OF RIGHT ARM ARTERIOVENOUS FISTULA;  Surgeon: [name], MD;  Location: [facility];  Service: Vascular;  Laterality: Right;   PERIPHERAL VASCULAR BALLOON ANGIOPLASTY Right 01/29/2022   Procedure: PERIPHERAL VASCULAR BALLOON ANGIOPLASTY;  Surgeon: Serafina Mitchell, MD;  Location: [facility];  Service: Cardiovascular;  Laterality: Right;  arm fistula    POLYPECTOMY  11/12/2018   Procedure: POLYPECTOMY;  Surgeon: Rogene Houston, MD;  [facility];  Service: Endoscopy;;  colon    RETINAL DETACHMENT SURGERY Left 2019   SPLENECTOMY, TOTAL     TONSILLECTOMY     VOCAL Ernest INJECTION     removal of polyp-2005    Social History:  reports that he has quit smoking. His smoking use included cigarettes. He has a 90.00 pack-year smoking history. He uses smokeless tobacco. He reports that he does not currently use alcohol after a past usage of about 1.0 - 2.0 standard drink of alcohol per week. He reports that he does not use drugs.  No Known Allergies  No family history on file.   Prior to Admission medications   Medication Sig Start Date End Date Taking? Authorizing Provider  acyclovir (ZOVIRAX) 200 MG capsule Take 1 capsule (200 mg total) by mouth 2 (two) times daily. 03/28/22   Derek Jack, MD  albuterol (VENTOLIN HFA) 108 (90 Base) MCG/ACT inhaler Inhale 2 puffs into the lungs every 6 (six) hours as needed for wheezing or shortness of breath. 05/09/22   Lindell Spar, MD  aspirin  EC 81 MG tablet Take 1 tablet (81 mg total) by mouth daily. 05/13/20   Rehman, Mechele Dawley, MD  bortezomib IV (VELCADE) 3.5 MG injection Inject 1.5 mg/m2 into the vein once a week. 03/14/21   [provider]  budesonide-formoterol (SYMBICORT) 80-4.5 MCG/ACT inhaler Inhale 2 puffs into the lungs 2 (two) times daily as needed (shortness of breath). 05/09/22   Lindell Spar, MD  dexamethasone (DECADRON) 4 MG tablet Take 2.5 tablets (10 mg total) by mouth once a week. Take at home along with Tylenol and Benadryl weekly on the day of daratumumab injection. 10/04/21   Derek Jack, MD  docusate sodium (COLACE) 100 MG capsule Take 100 mg by mouth daily.    [provider]  gabapentin (NEURONTIN) 300 MG capsule Take 1 capsule by mouth twice daily 02/25/22   Derek Jack, MD  HYDROcodone-acetaminophen (NORCO/VICODIN) 5-325 MG tablet  Take by mouth. 04/15/22   [provider]  midodrine (PROAMATINE) 10 MG tablet Take by mouth. 09/20/21   [provider]  multivitamin (RENA-VIT) TABS tablet Take 1 tablet by mouth daily. 05/13/22   [provider]  Omega-3 Fatty Acids (FISH OIL) 1000 MG CAPS Take 1,000 mg by mouth in the morning, at noon, and at bedtime.     [provider]  oxyCODONE (OXY IR/ROXICODONE) 5 MG immediate release tablet Take 1 tablet (5 mg total) by mouth 2 (two) times daily as needed for severe pain. 09/11/21   Derek Jack, MD  pantoprazole (PROTONIX) 40 MG tablet Take 1 tablet (40 mg total) by mouth daily. 05/09/22   Lindell Spar, MD  polyethylene glycol [facility] Floria Raveling) packet Take 17 g by mouth daily. Patient states that he takes as needed. 03/31/18   Rogene Houston, MD  pravastatin (PRAVACHOL) 80 MG tablet Take 1 tablet (80 mg total) by mouth daily. 06/25/21   Noreene Larsson, NP  sildenafil (REVATIO) 20 MG tablet Take 1 tablet (20 mg total) by mouth 5 (five) times daily as needed. 10/23/21   Noreene Larsson, NP  tamsulosin [facility]) 0.4 MG CAPS capsule Take 1 capsule by mouth once daily 05/20/22   Lindell Spar, MD  tiZANidine (ZANAFLEX) 2 MG tablet TAKE 1 TABLET BY MOUTH EVERY 6 HOURS AS NEEDED FOR MUSCLE SPASM 11/01/21   Noreene Larsson, NP  torsemide (DEMADEX) 100 MG tablet Take 100 mg by mouth 2 (two) times daily. 07/27/21   [provider]    Physical Exam: Vitals:   07/09/22 2000 07/09/22 2105 07/09/22 2200 07/09/22 2232  BP: 121/83 108/68    Pulse: 93 94 (!) 102   Resp: _0 Temp:    98.2 F (36.8 C)  TempSrc:    Oral  SpO2: 96%  96%   Weight:      Height:       Constitutional: Resting in bed, calm, appears fatigued Eyes: Right erythematous conjunctive on, sclera with corneal ulcer/hypopyon.  Surrounding swelling and erythema of the eyelids.  No surrounding blisters.  See picture below. ENMT: Mucous membranes are moist. Posterior  pharynx clear of any exudate or lesions.Normal dentition.  Neck: normal, supple, no masses. Respiratory: clear to auscultation bilaterally, no wheezing, no crackles. Normal respiratory effort. No accessory muscle use.  Cardiovascular: Regular rate and rhythm, no murmurs / rubs / gallops. No extremity edema. 2+ pedal pulses. Abdomen: no tenderness, no masses palpated.  PD catheter in place. Musculoskeletal: no clubbing / cyanosis. No joint deformity upper and lower  extremities. Good ROM, no contractures. Normal muscle tone.  Skin: Right periorbital cellulitis as pictured below Neurologic: Sensation intact. Strength equal bilaterally. Psychiatric: Alert and oriented x 3.     EKG: Not performed.  Assessment/Plan Principal Problem:   Acute endophthalmitis of right eye Active Problems:   ESRD on peritoneal dialysis ([place])   Multiple myeloma without remission ([place])   COPD (chronic obstructive pulmonary disease) (HCC)   Orbital cellulitis on right   Corneal ulcer with hypopyon, right eye   Thrombocytopenia (HCC)   Ernest Barnett is a 66 y.o. male with medical history significant for ESRD on PD, IgG kappa multiple myeloma on active therapy, COPD, history of CVA, HLD, thrombocytopenia, hard of hearing who is admitted with acute right endophthalmitis with orbital and preseptal cellulitis.  Ophthalmology consulting.  Started on IV and topical antibiotics.  Assessment and Plan: * Acute endophthalmitis of right eye Right orbital/preseptal cellulitis Right corneal ulcer with hypopyon Ophthalmology to follow. -Continue antibiotics with IV vancomycin, ceftriaxone, Flagyl -Continue topical vancomycin and gentamicin -Continue IV acyclovir for now  ESRD on peritoneal dialysis [facility]) Nephrology to see in a.m. for routine dialysis needs.  Multiple myeloma without remission ([place]) Follows with oncology, Dr. Delton Coombes.  On active therapy with daratumumab and bortezomib.  Thrombocytopenia (HCC) Mild  without obvious bleeding.  Continue to monitor.  COPD (chronic obstructive pulmonary disease) (HCC) Stable, continue Dulera and albuterol as needed.  DVT prophylaxis: SCDs Start: 07/10/22 0058 Code Status: DNR, confirmed with patient on admission Family Communication: Discussed with son and patient's significant other at bedside Disposition Plan: From home, dispo pending clinical progress Consults called: Ophthalmology, neurology Severity of Illness: The appropriate patient status for this patient is INPATIENT. Inpatient status is judged to be reasonable and necessary in order to provide the required intensity of service to ensure the patient's safety. The patient's presenting symptoms, physical exam findings, and initial radiographic and laboratory data in the context of their chronic comorbidities is felt to place them at high risk for further clinical deterioration. Furthermore, it is not anticipated that the patient will be medically stable for discharge from the hospital within 2 midnights of admission.   * I certify that at the point of admission it is my clinical judgment that the patient will require inpatient hospital care spanning beyond 2 midnights from the point of admission due to high intensity of service, high risk for further deterioration and high frequency of surveillance required.Zada Finders MD [facility]  If 7PM-7AM, please contact night-coverage [email or web address]  07/10/2022, 12:59 AM

## 2022-07-09 NOTE — Assessment & Plan Note (Signed)
Right orbital/preseptal cellulitis Right corneal ulcer with hypopyon Ophthalmology to follow. -Continue antibiotics with IV vancomycin, ceftriaxone, Flagyl -Continue topical vancomycin and gentamicin -Continue IV acyclovir for now

## 2022-07-09 NOTE — Patient Instructions (Signed)
Emergency services contacted patient will be sent immediately to emergency room for neuro evaluation orbital evaluation look for endogenous endophthalmitis possible orbital cellulitis with secondary conjunctivitis/corneal keratitis of the right eye with no light perception vision.

## 2022-07-09 NOTE — Assessment & Plan Note (Signed)
For elevation and depression of the globe.  Intact abduction

## 2022-07-09 NOTE — ED Provider Notes (Addendum)
[facility] DEPARTMENT Provider Note   CSN: [id number] Arrival date & time: 07/09/22  1533     History  Chief Complaint  Patient presents with   Eye Pain   Facial Swelling    Ernest Barnett is a 66 y.o. male.  Patient w hx esrd/peritoneal dialysis, multiple myeloma, c/o unable to see out of right eye for past two days. States initially did have mild right sided headache but no head pain currently. Notes moderate right eye pain/soreness. States cannot see anything from right eye, not even light. No trauma to eye. No chemical exposure. No fever or chills. +eyelash matting and yellowish drainage from eye. No nausea/vomiting. Has seen ophthalmology for same, was sent to ED w concern orbital cellulitis, corneal ulcer/infection. Pt relatively poor historian - level 5 caveat.   The history is provided by the patient, medical records and the EMS personnel.  Eye Pain Pertinent negatives include no chest pain, no abdominal pain and no shortness of breath.       Home Medications Prior to Admission medications   Medication Sig Start Date End Date Taking? Authorizing Provider  acyclovir (ZOVIRAX) 200 MG capsule Take 1 capsule (200 mg total) by mouth 2 (two) times daily. 03/28/22   Derek Jack, MD  albuterol (VENTOLIN HFA) 108 (90 Base) MCG/ACT inhaler Inhale 2 puffs into the lungs every 6 (six) hours as needed for wheezing or shortness of breath. 05/09/22   Lindell Spar, MD  aspirin EC 81 MG tablet Take 1 tablet (81 mg total) by mouth daily. 05/13/20   Rehman, Mechele Dawley, MD  bortezomib IV (VELCADE) 3.5 MG injection Inject 1.5 mg/m2 into the vein once a week. 03/14/21   [provider]  budesonide-formoterol (SYMBICORT) 80-4.5 MCG/ACT inhaler Inhale 2 puffs into the lungs 2 (two) times daily as needed (shortness of breath). 05/09/22   Lindell Spar, MD  dexamethasone (DECADRON) 4 MG tablet Take 2.5 tablets (10 mg total) by mouth once a week. Take at home  along with Tylenol and Benadryl weekly on the day of daratumumab injection. 10/04/21   Derek Jack, MD  docusate sodium (COLACE) 100 MG capsule Take 100 mg by mouth daily.    [provider]  gabapentin (NEURONTIN) 300 MG capsule Take 1 capsule by mouth twice daily 02/25/22   Derek Jack, MD  HYDROcodone-acetaminophen (NORCO/VICODIN) 5-325 MG tablet Take by mouth. 04/15/22   [provider]  midodrine (PROAMATINE) 10 MG tablet Take by mouth. 09/20/21   [provider]  multivitamin (RENA-VIT) TABS tablet Take 1 tablet by mouth daily. 05/13/22   [provider]  Omega-3 Fatty Acids (FISH OIL) 1000 MG CAPS Take 1,000 mg by mouth in the morning, at noon, and at bedtime.     [provider]  oxyCODONE (OXY IR/ROXICODONE) 5 MG immediate release tablet Take 1 tablet (5 mg total) by mouth 2 (two) times daily as needed for severe pain. 09/11/21   Derek Jack, MD  pantoprazole (PROTONIX) 40 MG tablet Take 1 tablet (40 mg total) by mouth daily. 05/09/22   Lindell Spar, MD  polyethylene glycol [facility] Floria Raveling) packet Take 17 g by mouth daily. Patient states that he takes as needed. 03/31/18   Rogene Houston, MD  pravastatin (PRAVACHOL) 80 MG tablet Take 1 tablet (80 mg total) by mouth daily. 06/25/21   Noreene Larsson, NP  sildenafil (REVATIO) 20 MG tablet Take 1 tablet (20 mg total) by mouth 5 (five) times daily  as needed. 10/23/21   Noreene Larsson, NP  tamsulosin [facility]) 0.4 MG CAPS capsule Take 1 capsule by mouth once daily 05/20/22   Lindell Spar, MD  tiZANidine (ZANAFLEX) 2 MG tablet TAKE 1 TABLET BY MOUTH EVERY 6 HOURS AS NEEDED FOR MUSCLE SPASM 11/01/21   Noreene Larsson, NP  torsemide (DEMADEX) 100 MG tablet Take 100 mg by mouth 2 (two) times daily. 07/27/21   [provider]      Allergies    Patient has no known allergies.    Review of Systems   Review of Systems  Constitutional:  Negative for chills and fever.   Eyes:  Positive for pain.  Respiratory:  Negative for shortness of breath.   Cardiovascular:  Negative for chest pain.  Gastrointestinal:  Negative for abdominal pain and vomiting.  Musculoskeletal:  Negative for back pain, neck pain and neck stiffness.  Skin:  Negative for rash.  Neurological:  Negative for speech difficulty, weakness and numbness.    Physical Exam Updated Vital Signs BP 125/81 (BP Location: Left Arm)   Pulse 91   Temp 98 F (36.7 C) (Oral)   Resp 18   Ht 1.702 m ('5\' 7"' )   Wt 115.2 kg   SpO2 96%   BMI 39.78 kg/m  Physical Exam Vitals and nursing note reviewed.  Constitutional:      Appearance: Normal appearance. He is well-developed.  HENT:     Head: Atraumatic.     Nose: Nose normal.     Mouth/Throat:     Mouth: Mucous membranes are moist.     Pharynx: Oropharynx is clear.  Eyes:     General: No scleral icterus.    Comments: Right corneal ulcer/hypopyon. Right conjunctiva and lids erythematous diffusely. 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 See attached photo and ophthalmology exam from today's date.  No shingles type lesions on nose, face or lids/eye  Neck:     Trachea: No tracheal deviation.     Comments: No neck stiffness or rigidity. Normal, full rom without pain.  Cardiovascular:     Rate and Rhythm: Normal rate and regular rhythm.     Pulses: Normal pulses.     Heart sounds: Normal heart sounds. No murmur heard.    No friction rub. No gallop.  Pulmonary:     Effort: Pulmonary effort is normal. No accessory muscle usage or respiratory distress.     Breath sounds: Normal breath sounds.  Abdominal:     General: There is no distension.     Palpations: Abdomen is soft.     Tenderness: There is no abdominal tenderness.  Genitourinary:    Comments: No cva tenderness. Musculoskeletal:        General: No swelling.     Cervical back: Normal range of motion and neck supple. No rigidity.  Skin:     General: Skin is warm and dry.     Findings: No rash.  Neurological:     Mental Status: He is alert.     Comments: Alert, speech clear. Motor/sens grossly intact bil.   Psychiatric:        Mood and Affect: Mood normal.      ED Results / Procedures / Treatments   Labs (all labs ordered are listed, but only abnormal results are displayed) Results for orders placed or performed during the hospital encounter of 07/09/22  Eye culture w Gram Stain   Specimen: Eye  Result Value Ref Range   Specimen Description EYE    Special Requests NONE    Gram Stain      FEW WBC PRESENT, PREDOMINANTLY PMN RARE GRAM POSITIVE RODS Performed at [facility], [place] [address], [place], [place] [address]    Culture PENDING    Report Status PENDING   CBC  Result Value Ref Range   WBC 5.6 4.0 - 10.5 K/uL   RBC 3.58 (L) 4.22 - 5.81 MIL/uL   Hemoglobin 12.5 (L) 13.0 - 17.0 g/dL   HCT 36.4 (L) 39.0 - 52.0 %   MCV 101.7 (H) 80.0 - 100.0 fL   MCH 34.9 (H) 26.0 - 34.0 pg   MCHC 34.3 30.0 - 36.0 g/dL   RDW 17.2 (H) 11.5 - 15.5 %   Platelets 93 (L) 150 - 400 K/uL   nRBC 0.4 (H) 0.0 - 0.2 %  Lactic acid, plasma  Result Value Ref Range   Lactic Acid, Venous 0.8 0.5 - 1.9 mmol/L  Comprehensive metabolic panel  Result Value Ref Range   Sodium 140 135 - 145 mmol/L   Potassium 3.5 3.5 - 5.1 mmol/L   Chloride 109 98 - 111 mmol/L   CO2 19 (L) 22 - 32 mmol/L   Glucose, Bld 127 (H) 70 - 99 mg/dL   BUN 31 (H) 8 - 23 mg/dL   Creatinine, Ser 3.61 (H) 0.61 - 1.24 mg/dL   Calcium 9.6 8.9 - 10.3 mg/dL   Total Protein 6.5 6.5 - 8.1 g/dL   Albumin 3.4 (L) 3.5 - 5.0 g/dL   AST 14 (L) 15 - 41 U/L   ALT 21 0 - 44 U/L   Alkaline Phosphatase 54 38 - 126 U/L   Total Bilirubin 0.6 0.3 - 1.2 mg/dL   GFR, Estimated 18 (L) >60 mL/min   Anion gap 12 5 - 15   B-Scan Ultrasound - OS - Left Eye  Result Date: 07/09/2022 Quality was good. Notes Optically empty vitreous cavity.  No retinal tears or detachment.  No  obvious infiltrates or debris    EKG None  Radiology B-Scan Ultrasound - OS - Left Eye  Result Date: 07/09/2022 Quality was good. Notes Optically empty vitreous cavity.  No retinal tears or detachment.  No obvious infiltrates or debris   Procedures Procedures    Medications Ordered in ED Medications  vancomycin 25 mg/mL compounded topical ophthalmic solution (has no administration in time range)  cefTRIAXone (ROCEPHIN) 2 g in sodium chloride 0.9 % 100 mL IVPB (has no administration in time range)  metroNIDAZOLE (FLAGYL) IVPB 500 mg (has no administration in time range)    ED Course/ Medical Decision Making/ A&P                           Medical Decision Making Problems Addressed: Cellulitis of right orbital region: acute illness or injury with systemic symptoms that poses a threat to life or bodily functions Corneal ulcer of right eye: acute illness or injury with systemic symptoms that poses a threat to life or bodily functions Endophthalmitis purulent, right: acute illness or injury with systemic symptoms that poses a threat to life or bodily functions ESRD on peritoneal dialysis [facility]): acute illness or injury with systemic symptoms that poses a threat to life or bodily functions Hypopyon of right eye: acute illness or injury with systemic symptoms that poses a threat to life or bodily functions Multiple myeloma without remission ([place]): chronic illness or  injury with exacerbation, progression, or side effects of treatment that poses a threat to life or bodily functions Thrombocytopenia ([place]): chronic illness or injury  Amount and/or Complexity of Data Reviewed Independent Historian:     Details: Family, Dr Prudencio Burly - hx External Data Reviewed: notes. Labs: ordered. Decision-making details documented in ED Course. Radiology: ordered and independent interpretation performed. Decision-making details documented in ED Course.  Risk Prescription drug management. Decision regarding  hospitalization.   Iv ns. Continuous pulse ox and cardiac monitoring. Labs ordered/sent. Imaging ordered.   Reviewed nursing notes and prior charts for additional history. External reports reviewed - ophthy note from Dr Zadie Rhine from today reviewed. Additional history from: EMS.   Eye cultures sent. Iv vanc, rocephin and flagyl given. Topical vanc and ceftaz ordered.   Neurology consulted - discussed pt, they recommend mr brain and orbits with and without, and mr venogram.  States that if unable to get contrasted mri studies due to esrd/pd, to get cta/ct orbits. Neurology indicates if no acute cva or acute intracranial process, no need for complete neuro consult - appreciate input on imaging studies.   Dr Radene Ou note references him enlisted Dr Prudencio Burly on case - called Dr Prudencio Burly office, he is not there, but staff indicate they will page him to ED.  Discussed pt with Dr Prudencio Burly - he indicates medicine admission, and that both he and Dr Zadie Rhine will continue to follow patient and will f/u on imagingI studies.   Cardiac monitor: sinus rhythm, rate 90.  Labs reviewed/interpreted by me - wbc 5.6. normal.   While awaiting MRI, MRI tech called to say could not have MRI with contrast given esrd, and called back at ~ 1835 to say could not get any MRI study due to metal piece associated w CAPD catheter that is not MRI compatible.  CTA/CT venogram studies ordered.   CT reviewed/interpreted by me - orbital/periorbital cellulitis.   Hospitalists consulted for admission. Discussed w Dr Posey Pronto, including Dr Zadie Rhine note in chart - will admit.   Recheck pt comfortable, no acute change from initial exam, no new swelling or spreading redness. No severe headache. No neck stiffness/rigidity.   On exam, no vesicular lesions about eye, scalp, face, nose or ear,  no RHS noted - as etiology symptoms/infection not entirely clear, and pt on suppressive therapy for shingles (on acyclovir due to hx multiple myeloma), will also  order iv acyclovir per pharmacy consult.            Final Clinical Impression(s) / ED Diagnoses Final diagnoses:  None    Rx / DC Orders ED Discharge Orders     None         Lajean Saver, MD 07/09/22 2350

## 2022-07-10 ENCOUNTER — Inpatient Hospital Stay: Payer: Medicare HMO

## 2022-07-10 ENCOUNTER — Encounter (HOSPITAL_COMMUNITY): Payer: Self-pay | Admitting: Internal Medicine

## 2022-07-10 DIAGNOSIS — H15009 Unspecified scleritis, unspecified eye: Secondary | ICD-10-CM | POA: Diagnosis not present

## 2022-07-10 DIAGNOSIS — Z0389 Encounter for observation for other suspected diseases and conditions ruled out: Secondary | ICD-10-CM | POA: Diagnosis not present

## 2022-07-10 DIAGNOSIS — H16001 Unspecified corneal ulcer, right eye: Secondary | ICD-10-CM | POA: Diagnosis not present

## 2022-07-10 DIAGNOSIS — D630 Anemia in neoplastic disease: Secondary | ICD-10-CM | POA: Diagnosis not present

## 2022-07-10 DIAGNOSIS — Z66 Do not resuscitate: Secondary | ICD-10-CM | POA: Diagnosis not present

## 2022-07-10 DIAGNOSIS — C9000 Multiple myeloma not having achieved remission: Secondary | ICD-10-CM | POA: Diagnosis not present

## 2022-07-10 DIAGNOSIS — D649 Anemia, unspecified: Secondary | ICD-10-CM | POA: Diagnosis not present

## 2022-07-10 DIAGNOSIS — H44001 Unspecified purulent endophthalmitis, right eye: Secondary | ICD-10-CM | POA: Diagnosis not present

## 2022-07-10 DIAGNOSIS — Z8673 Personal history of transient ischemic attack (TIA), and cerebral infarction without residual deficits: Secondary | ICD-10-CM | POA: Diagnosis not present

## 2022-07-10 DIAGNOSIS — J9811 Atelectasis: Secondary | ICD-10-CM | POA: Diagnosis not present

## 2022-07-10 DIAGNOSIS — R69 Illness, unspecified: Secondary | ICD-10-CM | POA: Diagnosis not present

## 2022-07-10 DIAGNOSIS — H15031 Posterior scleritis, right eye: Secondary | ICD-10-CM | POA: Diagnosis not present

## 2022-07-10 DIAGNOSIS — F17210 Nicotine dependence, cigarettes, uncomplicated: Secondary | ICD-10-CM | POA: Diagnosis not present

## 2022-07-10 DIAGNOSIS — Z992 Dependence on renal dialysis: Secondary | ICD-10-CM | POA: Diagnosis not present

## 2022-07-10 DIAGNOSIS — E785 Hyperlipidemia, unspecified: Secondary | ICD-10-CM | POA: Diagnosis not present

## 2022-07-10 DIAGNOSIS — Z4682 Encounter for fitting and adjustment of non-vascular catheter: Secondary | ICD-10-CM | POA: Diagnosis not present

## 2022-07-10 DIAGNOSIS — H0589 Other disorders of orbit: Secondary | ICD-10-CM | POA: Diagnosis not present

## 2022-07-10 DIAGNOSIS — N2581 Secondary hyperparathyroidism of renal origin: Secondary | ICD-10-CM | POA: Diagnosis not present

## 2022-07-10 DIAGNOSIS — D696 Thrombocytopenia, unspecified: Secondary | ICD-10-CM | POA: Diagnosis not present

## 2022-07-10 DIAGNOSIS — J449 Chronic obstructive pulmonary disease, unspecified: Secondary | ICD-10-CM | POA: Diagnosis not present

## 2022-07-10 DIAGNOSIS — H20051 Hypopyon, right eye: Secondary | ICD-10-CM

## 2022-07-10 DIAGNOSIS — Z79899 Other long term (current) drug therapy: Secondary | ICD-10-CM | POA: Diagnosis not present

## 2022-07-10 DIAGNOSIS — N186 End stage renal disease: Secondary | ICD-10-CM | POA: Diagnosis not present

## 2022-07-10 DIAGNOSIS — H05011 Cellulitis of right orbit: Secondary | ICD-10-CM | POA: Diagnosis not present

## 2022-07-10 DIAGNOSIS — H3091 Unspecified chorioretinal inflammation, right eye: Secondary | ICD-10-CM | POA: Diagnosis not present

## 2022-07-10 DIAGNOSIS — D631 Anemia in chronic kidney disease: Secondary | ICD-10-CM | POA: Diagnosis not present

## 2022-07-10 DIAGNOSIS — H4051X0 Glaucoma secondary to other eye disorders, right eye, stage unspecified: Secondary | ICD-10-CM | POA: Diagnosis not present

## 2022-07-10 DIAGNOSIS — Z961 Presence of intraocular lens: Secondary | ICD-10-CM | POA: Diagnosis not present

## 2022-07-10 DIAGNOSIS — H5710 Ocular pain, unspecified eye: Secondary | ICD-10-CM | POA: Diagnosis not present

## 2022-07-10 DIAGNOSIS — H4921 Sixth [abducent] nerve palsy, right eye: Secondary | ICD-10-CM | POA: Diagnosis not present

## 2022-07-10 DIAGNOSIS — H5711 Ocular pain, right eye: Secondary | ICD-10-CM | POA: Diagnosis not present

## 2022-07-10 DIAGNOSIS — H9190 Unspecified hearing loss, unspecified ear: Secondary | ICD-10-CM | POA: Diagnosis not present

## 2022-07-10 DIAGNOSIS — E7800 Pure hypercholesterolemia, unspecified: Secondary | ICD-10-CM | POA: Diagnosis not present

## 2022-07-10 DIAGNOSIS — Z6841 Body Mass Index (BMI) 40.0 and over, adult: Secondary | ICD-10-CM | POA: Diagnosis not present

## 2022-07-10 DIAGNOSIS — E876 Hypokalemia: Secondary | ICD-10-CM | POA: Diagnosis not present

## 2022-07-10 DIAGNOSIS — I120 Hypertensive chronic kidney disease with stage 5 chronic kidney disease or end stage renal disease: Secondary | ICD-10-CM | POA: Diagnosis not present

## 2022-07-10 DIAGNOSIS — H16031 Corneal ulcer with hypopyon, right eye: Secondary | ICD-10-CM

## 2022-07-10 DIAGNOSIS — H4901 Third [oculomotor] nerve palsy, right eye: Secondary | ICD-10-CM | POA: Diagnosis not present

## 2022-07-10 DIAGNOSIS — E559 Vitamin D deficiency, unspecified: Secondary | ICD-10-CM | POA: Diagnosis not present

## 2022-07-10 DIAGNOSIS — B9789 Other viral agents as the cause of diseases classified elsewhere: Secondary | ICD-10-CM | POA: Diagnosis not present

## 2022-07-10 DIAGNOSIS — H547 Unspecified visual loss: Secondary | ICD-10-CM | POA: Diagnosis not present

## 2022-07-10 DIAGNOSIS — H1089 Other conjunctivitis: Secondary | ICD-10-CM | POA: Diagnosis not present

## 2022-07-10 DIAGNOSIS — F17290 Nicotine dependence, other tobacco product, uncomplicated: Secondary | ICD-10-CM | POA: Diagnosis not present

## 2022-07-10 DIAGNOSIS — Z9842 Cataract extraction status, left eye: Secondary | ICD-10-CM | POA: Diagnosis not present

## 2022-07-10 DIAGNOSIS — Z7951 Long term (current) use of inhaled steroids: Secondary | ICD-10-CM | POA: Diagnosis not present

## 2022-07-10 DIAGNOSIS — G4730 Sleep apnea, unspecified: Secondary | ICD-10-CM | POA: Diagnosis not present

## 2022-07-10 DIAGNOSIS — K219 Gastro-esophageal reflux disease without esophagitis: Secondary | ICD-10-CM | POA: Diagnosis not present

## 2022-07-10 LAB — BASIC METABOLIC PANEL
Anion gap: 11 (ref 5–15)
BUN: 32 mg/dL — ABNORMAL HIGH (ref 8–23)
CO2: 17 mmol/L — ABNORMAL LOW (ref 22–32)
Calcium: 9.3 mg/dL (ref 8.9–10.3)
Chloride: 109 mmol/L (ref 98–111)
Creatinine, Ser: 3.5 mg/dL — ABNORMAL HIGH (ref 0.61–1.24)
GFR, Estimated: 19 mL/min — ABNORMAL LOW (ref >60)
Glucose, Bld: 118 mg/dL — ABNORMAL HIGH (ref 70–99)
Potassium: 3.4 mmol/L — ABNORMAL LOW (ref 3.5–5.1)
Sodium: 137 mmol/L (ref 135–145)

## 2022-07-10 LAB — CBC
HCT: 35.3 % — ABNORMAL LOW (ref 39.0–52.0)
Hemoglobin: 11.8 g/dL — ABNORMAL LOW (ref 13.0–17.0)
MCH: 34.2 pg — ABNORMAL HIGH (ref 26.0–34.0)
MCHC: 33.4 g/dL (ref 30.0–36.0)
MCV: 102.3 fL — ABNORMAL HIGH (ref 80.0–100.0)
Platelets: 91 10*3/uL — ABNORMAL LOW (ref 150–400)
RBC: 3.45 MIL/uL — ABNORMAL LOW (ref 4.22–5.81)
RDW: 17 % — ABNORMAL HIGH (ref 11.5–15.5)
WBC: 4.6 10*3/uL (ref 4.0–10.5)
nRBC: 0.4 % — ABNORMAL HIGH (ref 0.0–0.2)

## 2022-07-10 LAB — HIV ANTIBODY (ROUTINE TESTING W REFLEX): HIV Screen 4th Generation wRfx: NONREACTIVE

## 2022-07-10 MED ORDER — ALBUTEROL SULFATE (2.5 MG/3ML) 0.083% IN NEBU
3.0000 mL | INHALATION_SOLUTION | Freq: Four times a day (QID) | RESPIRATORY_TRACT | Status: DC | PRN
Start: 2022-07-10 — End: 2022-07-10

## 2022-07-10 MED ORDER — ASPIRIN 81 MG PO TBEC
81.0000 mg | DELAYED_RELEASE_TABLET | Freq: Every day | ORAL | Status: DC
  Administered 2022-07-10: 81 mg via ORAL
  Filled 2022-07-10: qty 1

## 2022-07-10 MED ORDER — SODIUM CHLORIDE 0.9 % IV SOLN: 2.0000 g | INTRAVENOUS | Status: DC

## 2022-07-10 MED ORDER — SODIUM CHLORIDE 0.9 % IV SOLN
1.0000 g | INTRAVENOUS | Status: DC
  Filled 2022-07-10: qty 10

## 2022-07-10 MED ORDER — TAMSULOSIN HCL 0.4 MG PO CAPS
0.4000 mg | ORAL_CAPSULE | Freq: Every day | ORAL | Status: DC
  Administered 2022-07-10: 0.4 mg via ORAL
  Filled 2022-07-10: qty 1

## 2022-07-10 MED ORDER — ATROPINE SULFATE 1 % OP SOLN: 1.0000 [drp] | Freq: Four times a day (QID) | OPHTHALMIC | 12 refills | Status: DC

## 2022-07-10 MED ORDER — ATROPINE SULFATE 1 % OP SOLN
1.0000 [drp] | Freq: Four times a day (QID) | OPHTHALMIC | Status: DC
  Administered 2022-07-10: 1 [drp] via OPHTHALMIC
  Filled 2022-07-10: qty 2

## 2022-07-10 MED ORDER — MOMETASONE FURO-FORMOTEROL FUM 100-5 MCG/ACT IN AERO
2.0000 | INHALATION_SPRAY | Freq: Two times a day (BID) | RESPIRATORY_TRACT | Status: DC
  Filled 2022-07-10: qty 8.8

## 2022-07-10 MED ORDER — ONDANSETRON HCL 4 MG/2ML IJ SOLN: 4.0000 mg | Freq: Four times a day (QID) | INTRAMUSCULAR | Status: DC | PRN

## 2022-07-10 MED ORDER — GENTAMICIN FORTIFIED OPHTHALMIC SOLUTION: 1.0000 [drp] | OPHTHALMIC | Status: DC

## 2022-07-10 MED ORDER — VANCOMYCIN 25 MG/ML COMPOUNDED TOPICAL OPHTHALMIC SOLUTION
1.0000 [drp] | OPHTHALMIC | Status: DC
Start: 2022-07-10 — End: 2022-08-02

## 2022-07-10 MED ORDER — SODIUM CHLORIDE 0.9% FLUSH: 3.0000 mL | Freq: Two times a day (BID) | INTRAVENOUS | Status: DC

## 2022-07-10 MED ORDER — PRAVASTATIN SODIUM 40 MG PO TABS
80.0000 mg | ORAL_TABLET | Freq: Every day | ORAL | Status: DC
  Administered 2022-07-10: 80 mg via ORAL
  Filled 2022-07-10: qty 2

## 2022-07-10 MED ORDER — SODIUM CHLORIDE 0.9 % IV SOLN: 1.0000 g | INTRAVENOUS | Status: DC

## 2022-07-10 MED ORDER — ACETAMINOPHEN 650 MG RE SUPP: 650.0000 mg | Freq: Four times a day (QID) | RECTAL | Status: DC | PRN

## 2022-07-10 MED ORDER — SENNOSIDES-DOCUSATE SODIUM 8.6-50 MG PO TABS: 1.0000 | ORAL_TABLET | Freq: Every evening | ORAL | Status: DC | PRN

## 2022-07-10 MED ORDER — DEXTROSE 5 % IV SOLN: 500.0000 mg | INTRAVENOUS | Status: DC

## 2022-07-10 MED ORDER — HEPARIN SODIUM (PORCINE) 5000 UNIT/ML IJ SOLN: 5000.0000 [IU] | Freq: Three times a day (TID) | INTRAMUSCULAR | Status: DC

## 2022-07-10 MED ORDER — ATROPINE SULFATE 1 MG/10ML IJ SOSY: 1.0000 mg | PREFILLED_SYRINGE | Freq: Every day | INTRAMUSCULAR | Status: DC

## 2022-07-10 MED ORDER — METRONIDAZOLE 500 MG/100ML IV SOLN
500.0000 mg | Freq: Two times a day (BID) | INTRAVENOUS | Status: DC
Start: 2022-07-10 — End: 2022-07-10

## 2022-07-10 MED ORDER — ACETAMINOPHEN 325 MG PO TABS: 650.0000 mg | ORAL_TABLET | Freq: Four times a day (QID) | ORAL | Status: DC | PRN

## 2022-07-10 MED ORDER — LACTATED RINGERS IV SOLN: INTRAVENOUS | Status: DC

## 2022-07-10 MED ORDER — ONDANSETRON HCL 4 MG PO TABS: 4.0000 mg | ORAL_TABLET | Freq: Four times a day (QID) | ORAL | Status: DC | PRN

## 2022-07-10 NOTE — Hospital Course (Signed)
Ernest Barnett is a 66 y.o. male with medical history significant for ESRD on PD, IgG kappa multiple myeloma on active therapy, COPD, history of CVA, HLD, thrombocytopenia, hard of hearing who is admitted with acute right endophthalmitis with orbital and preseptal cellulitis.  Ophthalmology consulting.  Started on IV and topical antibiotics.

## 2022-07-10 NOTE — Assessment & Plan Note (Signed)
Nephrology to see in a.m. for routine dialysis needs.

## 2022-07-10 NOTE — Discharge Summary (Signed)
Physician Discharge Summary  Ernest Barnett:[id number] DOB: 05-29-1956 DOA: 07/09/2022  PCP: Lindell Spar, MD  Admit date: 07/09/2022 Discharge date: 07/10/2022  Admitted From: Home Disposition:  [facility]  Recommendations for Outpatient Follow-up:  Follow up for orbital cellulitis probably viral concerned about herpes zoster  Home Health:No Equipment/Devices:None  Discharge Condition:Stable CODE STATUS:Full Diet recommendation: Heart Healthy    Brief/Interim Summary: 66 y.o. male with past medical history for end-stage renal disease on peritoneal dialysis due to multiple myeloma COPD, history of CVA, thrombocytopenia presents to the ED for right eye infection with vision loss, previously seen on September 3 he was seen in follow-up by Dr. Gillian Scarce he did not keep his follow-up appointment now with right-sided endophthalmitis and orbital cellulitis with corneal evisceration and a 6th nerve palsy and 3rd nerve palsy.  MRI is unable to be performed due to metallic piece of the peritoneal catheter, CT of the orbit with contrast showed preseptal cellulitis and early orbital cellulitis, CT angio of head and neck showed no intracranial large vessel occlusion moderate stenosis of the left M1 there is a duction of the right vocal fold with medialization of the left vocal cord concerning for right vocal cord paralysis  Discharge Diagnoses:  Principal Problem:   Acute endophthalmitis of right eye Active Problems:   ESRD on peritoneal dialysis ([facility])   Multiple myeloma without remission ([facility])   COPD (chronic obstructive pulmonary disease) ([facility])   Orbital cellulitis on right   Corneal ulcer with hypopyon, right eye   Thrombocytopenia (HCC)  Acute endophthalmitis of the right eye/orbital cellulitis on the right/corneal ulceration probably due to viral induced orbital apex syndrome: Involving the second, third and 6 nerves with a corneal ulceration and secondary glaucoma. Ophthalmology  was consulted and concerned about a superimposed bacterial infection they recommended IV acyclovir, IV vancomycin IV Rocephin. They recommended transfer to a tertiary center. There was also question of [facility]. Ophthalmology also recommended to transfer the patient to [facility] which he arrange an ED to ED transfer. Dr. Gypsy Decant ophthalmologist will see at [facility]. Of neurology recommended atropine topical for pain control.  End-stage renal disease on peritoneal dialysis: We will continue peritoneal dialysis as scheduled.  Multiple myeloma without remission: ID has been consulted the question will be who we have to hold immunosuppressive therapy.  COPD: Continue inhalers.  Acute thrombocytopenia: Stable around 100 probably due to infectious etiology.  Discharge Instructions   Allergies as of 07/10/2022   No Known Allergies      Medication List     STOP taking these medications    acyclovir 200 MG capsule Commonly known as: ZOVIRAX       TAKE these medications    albuterol 108 (90 Base) MCG/ACT inhaler Commonly known as: VENTOLIN HFA Inhale 2 puffs into the lungs every 6 (six) hours as needed for wheezing or shortness of breath.   aspirin EC 81 MG tablet Take 1 tablet (81 mg total) by mouth daily.   atropine 1 % ophthalmic solution Place 1 drop into the right eye 4 (four) times daily.   bortezomib IV 3.5 MG injection Commonly known as: VELCADE Inject 1.5 mg/m2 into the vein once a week.   brinzolamide 1 % ophthalmic suspension Commonly known as: AZOPT Place 1 drop into the right eye 3 (three) times daily.   budesonide-formoterol 80-4.5 MCG/ACT inhaler Commonly known as: SYMBICORT Inhale 2 puffs into the lungs 2 (two) times daily as needed (shortness of breath).  cefTRIAXone 2 g in sodium chloride 0.9 % 100 mL Inject 2 g into the vein daily.   dexamethasone 4 MG tablet Commonly known as: DECADRON Take 2.5 tablets (10 mg total) by mouth once a  week. Take at home along with Tylenol and Benadryl weekly on the day of daratumumab injection.   docusate sodium 100 MG capsule Commonly known as: COLACE Take 100 mg by mouth daily.   Fish Oil 1000 MG Caps Take 1,000 mg by mouth in the morning, at noon, and at bedtime.   gabapentin 300 MG capsule Commonly known as: NEURONTIN Take 1 capsule by mouth twice daily   gentamicin FORTIFIED 7 ml Soln Place 0.05 mLs (1 drop total) into the right eye every hour.   gentamicin FORTIFIED 7 ml Soln Place 0.05 mLs (1 drop total) into the right eye every 4 (four) hours.   HYDROcodone-acetaminophen 5-325 MG tablet Commonly known as: NORCO/VICODIN Take by mouth.   midodrine 10 MG tablet Commonly known as: PROAMATINE Take by mouth.   multivitamin Tabs tablet Take 1 tablet by mouth daily.   oxyCODONE 5 MG immediate release tablet Commonly known as: Oxy IR/ROXICODONE Take 1 tablet (5 mg total) by mouth 2 (two) times daily as needed for severe pain.   pantoprazole 40 MG tablet Commonly known as: PROTONIX Take 1 tablet (40 mg total) by mouth daily.   polyethylene glycol 17 g packet Commonly known as: MIRALAX / GLYCOLAX Take 17 g by mouth daily. Patient states that he takes as needed.   pravastatin 80 MG tablet Commonly known as: PRAVACHOL Take 1 tablet (80 mg total) by mouth daily.   ranolazine 500 MG 12 hr tablet Commonly known as: RANEXA Take 500 mg by mouth daily.   sildenafil 20 MG tablet Commonly known as: REVATIO Take 1 tablet (20 mg total) by mouth 5 (five) times daily as needed.   tamsulosin 0.4 MG Caps capsule Commonly known as: FLOMAX Take 1 capsule by mouth once daily   tiZANidine 2 MG tablet Commonly known as: ZANAFLEX TAKE 1 TABLET BY MOUTH EVERY 6 HOURS AS NEEDED FOR MUSCLE SPASM   torsemide 100 MG tablet Commonly known as: DEMADEX Take 100 mg by mouth 2 (two) times daily.   vancomycin 25 mg/mL Soln Commonly known as: VANCOCIN Apply 1 drop to eye every  hour.        No Known Allergies  Consultations: Ophthalmology Infectious disease   Procedures/Studies: CT Orbits W Contrast  Result Date: 07/09/2022 CLINICAL DATA:  Orbital cellulitis suspected EXAM: CT ORBITS WITH CONTRAST TECHNIQUE: Multidetector CT images was performed according to the standard protocol following intravenous contrast administration. RADIATION DOSE REDUCTION: This exam was performed according to the departmental dose-optimization program which includes automated exposure control, adjustment of the mA and/or kV according to patient size and/or use of iterative reconstruction technique. CONTRAST:  26m OMNIPAQUE IOHEXOL 350 MG/ML SOLN COMPARISON:  None Available. FINDINGS: Orbits: No orbital mass. Mild hyperenhancement and thickening of the sclera of the right globe (series 31, image 27). Minimal fat stranding posterior to the right globe (series 33, image 29). No definite hyperenhancement of the right optic nerve sheath complex. Normal appearance of the extra-ocular muscles. Right proptosis. Normal appearance of the left orbit. No evidence of postseptal collection or subperiosteal abscess status post bilateral lens replacements. Visible paranasal sinuses: Clear. Soft tissues: Thickening and enhancement of the right eyelid and periorbital soft tissues. Osseous: No fracture or aggressive lesion. Limited intracranial: Please see same day CTA head neck. IMPRESSION: Right  periorbital soft tissue enhancement and thickening, consistent with preseptal cellulitis, with hyperenhancement and thickening of the right sclera and minimal fat stranding posterior to the right globe, concerning for early orbital cellulitis. Electronically Signed   By: Merilyn Baba M.D.   On: 07/09/2022 23:10   CT VENOGRAM HEAD  Result Date: 07/09/2022 CLINICAL DATA:  Stroke suspected EXAM: CT ANGIOGRAPHY HEAD AND NECK CT VENOGRAM HEAD TECHNIQUE: Multidetector CT imaging of the head and neck was performed using the  standard protocol during bolus administration of intravenous contrast. Multiplanar CT image reconstructions and MIPs were obtained to evaluate the vascular anatomy. Carotid stenosis measurements (when applicable) are obtained utilizing NASCET criteria, using the distal internal carotid diameter as the denominator. Venographic phase images of the brain were obtained following the administration of intravenous contrast. Multiplanar reformats and maximum intensity projections were generated. RADIATION DOSE REDUCTION: This exam was performed according to the departmental dose-optimization program which includes automated exposure control, adjustment of the mA and/or kV according to patient size and/or use of iterative reconstruction technique. CONTRAST:  41m OMNIPAQUE IOHEXOL 350 MG/ML SOLN COMPARISON:  None Available. FINDINGS: CT HEAD FINDINGS Brain: No evidence of acute infarct, hemorrhage, mass, mass effect, or midline shift. No hydrocephalus or extra-axial fluid collection. Encephalomalacia in the anterior right frontal lobe. Periventricular white matter changes, likely the sequela of chronic small vessel ischemic disease. Remote lacunar infarcts in the pons Vascular: No hyperdense vessel. Skull: Normal. Negative for fracture or focal lesion. Sinuses/Orbits: No acute finding in the sinuses. Please see same-day CT orbits for orbital findings. Other: The mastoids are not pneumatized. CTA NECK FINDINGS Aortic arch: Two-vessel arch with a common origin of the brachiocephalic and left common carotid arteries. Imaged portion shows no evidence of aneurysm or dissection. No significant stenosis of the major arch vessel origins. Mild aortic atherosclerosis. Right carotid system: No evidence of dissection, occlusion, or hemodynamically significant stenosis (greater than 50%). Atherosclerotic disease at the bifurcation and in the proximal ICA is not hemodynamically significant. Left carotid system: No evidence of dissection,  occlusion, or hemodynamically significant stenosis (greater than 50%). Atherosclerotic disease at the bifurcation and in the proximal ICA is not hemodynamically significant. Significant tortuosity of the left ICA. Vertebral arteries: Evaluation of the proximal vertebral arteries bilaterally is somewhat limited by beam hardening artifact from the contrast bolus. Within this limitation, no evidence of dissection, occlusion, or hemodynamically significant stenosis (greater than 50%). Skeleton: No acute osseous abnormality. Degenerative changes in the cervical spine. Edentulous. Other neck: Medialization of the left vocal fold with abduction of the right vocal fold and enlargement of the right piriform sinus. Upper chest: No acute finding. Review of the MIP images confirms the above findings CTA HEAD FINDINGS Anterior circulation: Both internal carotid arteries are patent to the termini, without significant stenosis. Fusiform dilatation of the right cavernous carotid which measures up to 8 mm (series 18, image 118 and series 20, image 79), compared to 6 mm more proximally (series 20, image 82). A1 segments patent. Normal anterior communicating artery. Anterior cerebral arteries are irregular but patent to their distal aspects. Long segment moderate narrowing in the proximal left M1 (series 18, image 100) MCA branches perfused and symmetric. Posterior circulation: Vertebral arteries patent to the vertebrobasilar junction without stenosis. Posterior inferior cerebellar arteries patent proximally. Basilar patent to its distal aspect. Superior cerebellar arteries patent proximally. Patent but diminutive P1 segments. Patent bilateral posterior communicating arteries with near fetal origin of the bilateral PCAs. Multifocal mild narrowing and irregularity are noted in  the left posterior communicating artery and bilateral PCAs. PCAs perfused to their distal aspects. Anatomic variants: Near fetal origin of the bilateral PCAs.  Review of the MIP images confirms the above findings CTV FINDINGS Superior sagittal sinus: Normal. Straight sinus: Normal. Inferior sagittal sinus, vein of Galen and internal cerebral veins: Normal. Transverse sinuses: Normal. Sigmoid sinuses: Normal. Visualized jugular veins: Normal IMPRESSION: 1. No intracranial large vessel occlusion. Moderate stenosis in the left M1 and mild multifocal narrowing in the bilateral PCAs. 2. Fusiform dilatation of the right cavernous carotid, measuring up to 8 mm 3.  No hemodynamically significant stenosis in the neck. 4.  No acute intracranial process. 5. Abduction of the right vocal fold with medial ization of the left vocal fold, concerning for right vocal cord paralysis. 6. No evidence of intracranial venous sinus thrombosis or stenosis. 7. For orbital findings, please see same-day CT orbits. Electronically Signed   By: Merilyn Baba M.D.   On: 07/09/2022 22:59   CT ANGIO HEAD NECK W WO CM  Result Date: 07/09/2022 CLINICAL DATA:  Stroke suspected EXAM: CT ANGIOGRAPHY HEAD AND NECK CT VENOGRAM HEAD TECHNIQUE: Multidetector CT imaging of the head and neck was performed using the standard protocol during bolus administration of intravenous contrast. Multiplanar CT image reconstructions and MIPs were obtained to evaluate the vascular anatomy. Carotid stenosis measurements (when applicable) are obtained utilizing NASCET criteria, using the distal internal carotid diameter as the denominator. Venographic phase images of the brain were obtained following the administration of intravenous contrast. Multiplanar reformats and maximum intensity projections were generated. RADIATION DOSE REDUCTION: This exam was performed according to the departmental dose-optimization program which includes automated exposure control, adjustment of the mA and/or kV according to patient size and/or use of iterative reconstruction technique. CONTRAST:  77m OMNIPAQUE IOHEXOL 350 MG/ML SOLN COMPARISON:   None Available. FINDINGS: CT HEAD FINDINGS Brain: No evidence of acute infarct, hemorrhage, mass, mass effect, or midline shift. No hydrocephalus or extra-axial fluid collection. Encephalomalacia in the anterior right frontal lobe. Periventricular white matter changes, likely the sequela of chronic small vessel ischemic disease. Remote lacunar infarcts in the pons Vascular: No hyperdense vessel. Skull: Normal. Negative for fracture or focal lesion. Sinuses/Orbits: No acute finding in the sinuses. Please see same-day CT orbits for orbital findings. Other: The mastoids are not pneumatized. CTA NECK FINDINGS Aortic arch: Two-vessel arch with a common origin of the brachiocephalic and left common carotid arteries. Imaged portion shows no evidence of aneurysm or dissection. No significant stenosis of the major arch vessel origins. Mild aortic atherosclerosis. Right carotid system: No evidence of dissection, occlusion, or hemodynamically significant stenosis (greater than 50%). Atherosclerotic disease at the bifurcation and in the proximal ICA is not hemodynamically significant. Left carotid system: No evidence of dissection, occlusion, or hemodynamically significant stenosis (greater than 50%). Atherosclerotic disease at the bifurcation and in the proximal ICA is not hemodynamically significant. Significant tortuosity of the left ICA. Vertebral arteries: Evaluation of the proximal vertebral arteries bilaterally is somewhat limited by beam hardening artifact from the contrast bolus. Within this limitation, no evidence of dissection, occlusion, or hemodynamically significant stenosis (greater than 50%). Skeleton: No acute osseous abnormality. Degenerative changes in the cervical spine. Edentulous. Other neck: Medialization of the left vocal fold with abduction of the right vocal fold and enlargement of the right piriform sinus. Upper chest: No acute finding. Review of the MIP images confirms the above findings CTA HEAD  FINDINGS Anterior circulation: Both internal carotid arteries are patent to the termini, without  significant stenosis. Fusiform dilatation of the right cavernous carotid which measures up to 8 mm (series 18, image 118 and series 20, image 79), compared to 6 mm more proximally (series 20, image 82). A1 segments patent. Normal anterior communicating artery. Anterior cerebral arteries are irregular but patent to their distal aspects. Long segment moderate narrowing in the proximal left M1 (series 18, image 100) MCA branches perfused and symmetric. Posterior circulation: Vertebral arteries patent to the vertebrobasilar junction without stenosis. Posterior inferior cerebellar arteries patent proximally. Basilar patent to its distal aspect. Superior cerebellar arteries patent proximally. Patent but diminutive P1 segments. Patent bilateral posterior communicating arteries with near fetal origin of the bilateral PCAs. Multifocal mild narrowing and irregularity are noted in the left posterior communicating artery and bilateral PCAs. PCAs perfused to their distal aspects. Anatomic variants: Near fetal origin of the bilateral PCAs. Review of the MIP images confirms the above findings CTV FINDINGS Superior sagittal sinus: Normal. Straight sinus: Normal. Inferior sagittal sinus, vein of Galen and internal cerebral veins: Normal. Transverse sinuses: Normal. Sigmoid sinuses: Normal. Visualized jugular veins: Normal IMPRESSION: 1. No intracranial large vessel occlusion. Moderate stenosis in the left M1 and mild multifocal narrowing in the bilateral PCAs. 2. Fusiform dilatation of the right cavernous carotid, measuring up to 8 mm 3.  No hemodynamically significant stenosis in the neck. 4.  No acute intracranial process. 5. Abduction of the right vocal fold with medial ization of the left vocal fold, concerning for right vocal cord paralysis. 6. No evidence of intracranial venous sinus thrombosis or stenosis. 7. For orbital findings,  please see same-day CT orbits. Electronically Signed   By: Merilyn Baba M.D.   On: 07/09/2022 22:59   B-Scan Ultrasound - OS - Left Eye  Result Date: 07/09/2022 Quality was good. Notes Optically empty vitreous cavity.  No retinal tears or detachment.  No obvious infiltrates or debris  (Echo, Carotid, EGD, Colonoscopy, ERCP)    Subjective: Complaining of eye pain.  Discharge Exam: Vitals:   07/10/22 0900 07/10/22 0930  BP: 119/71 95/65  Pulse: (!) 107 (!) 104  Resp: 17 (!) 21  Temp:    SpO2: 95% 95%   Vitals:   07/10/22 0812 07/10/22 0845 07/10/22 0900 07/10/22 0930  BP:  105/66 119/71 95/65  Pulse:  93 (!) 107 (!) 104  Resp:  16 17 (!) 21  Temp: 98.3 F (36.8 C)     TempSrc: Oral     SpO2:  91% 95% 95%  Weight:      Height:        General: Pt is alert, awake, not in acute distress Cardiovascular: RRR, S1/S2 +, no rubs, no gallops Respiratory: CTA bilaterally, no wheezing, no rhonchi Abdominal: Soft, NT, ND, bowel sounds + Extremities: no edema, no cyanosis    The results of significant diagnostics from this hospitalization (including imaging, microbiology, ancillary and laboratory) are listed below for reference.     Microbiology: Recent Results (from the past 240 hour(s))  Eye culture w Gram Stain     Status: None (Preliminary result)   Collection Time: 07/09/22  4:00 PM   Specimen: Eye  Result Value Ref Range Status   Specimen Description EYE  Final   Special Requests NONE  Final   Gram Stain   Final    FEW WBC PRESENT, PREDOMINANTLY PMN RARE GRAM POSITIVE RODS    Culture   Final    NO GROWTH < 24 HOURS Performed at [facility], [address]  [place], [place] [address]    Report Status PENDING  Incomplete  Culture, blood (Routine X 2) w Reflex to ID Panel     Status: None (Preliminary result)   Collection Time: 07/10/22  4:27 AM   Specimen: BLOOD  Result Value Ref Range Status   Specimen Description BLOOD SITE NOT SPECIFIED  Final   Special  Requests   Final    BOTTLES DRAWN AEROBIC AND ANAEROBIC Blood Culture adequate volume   Culture   Final    NO GROWTH < 12 HOURS Performed at [facility], [address], [place], [place] [address]    Report Status PENDING  Incomplete  Culture, blood (Routine X 2) w Reflex to ID Panel     Status: None (Preliminary result)   Collection Time: 07/10/22  4:28 AM   Specimen: BLOOD  Result Value Ref Range Status   Specimen Description BLOOD SITE NOT SPECIFIED  Final   Special Requests   Final    BOTTLES DRAWN AEROBIC AND ANAEROBIC Blood Culture adequate volume   Culture   Final    NO GROWTH < 12 HOURS Performed at [facility], [place] [address] [place], [place] [address]    Report Status PENDING  Incomplete     Labs: BNP (last 3 results) No results for input(s): "BNP" in the last 8760 hours. Basic Metabolic Panel: Recent Labs  Lab 07/07/22 1851 07/09/22 2120 07/10/22 0420  NA 134* 140 137  K 3.5 3.5 3.4*  CL 103 109 109  CO2 22 19* 17*  GLUCOSE 109* 127* 118*  BUN 39* 31* 32*  CREATININE 3.99* 3.61* 3.50*  CALCIUM 8.8* 9.6 9.3   Liver Function Tests: Recent Labs  Lab 07/07/22 1851 07/09/22 2120  AST 15 14*  ALT 22 21  ALKPHOS 51 54  BILITOT 0.8 0.6  PROT 6.6 6.5  ALBUMIN 3.7 3.4*   No results for input(s): "LIPASE", "AMYLASE" in the last 168 hours. No results for input(s): "AMMONIA" in the last 168 hours. CBC: Recent Labs  Lab 07/07/22 1851 07/09/22 1720 07/10/22 0420  WBC 3.7* 5.6 4.6  NEUTROABS 1.3*  --   --   HGB 11.9* 12.5* 11.8*  HCT 33.8* 36.4* 35.3*  MCV 99.4 101.7* 102.3*  PLT 101* 93* 91*   Cardiac Enzymes: No results for input(s): "CKTOTAL", "CKMB", "CKMBINDEX", "TROPONINI" in the last 168 hours. BNP: Invalid input(s): "POCBNP" CBG: Recent Labs  Lab 07/07/22 1850  GLUCAP 111*   D-Dimer No results for input(s): "DDIMER" in the last 72 hours. Hgb A1c No results for input(s): "HGBA1C" in the last 72 hours. Lipid Profile No  results for input(s): "CHOL", "HDL", "LDLCALC", "TRIG", "CHOLHDL", "LDLDIRECT" in the last 72 hours. Thyroid function studies No results for input(s): "TSH", "T4TOTAL", "T3FREE", "THYROIDAB" in the last 72 hours.  Invalid input(s): "FREET3" Anemia work up No results for input(s): "VITAMINB12", "FOLATE", "FERRITIN", "TIBC", "IRON", "RETICCTPCT" in the last 72 hours. Urinalysis No results found for: "COLORURINE", "APPEARANCEUR", "LABSPEC", "PHURINE", "GLUCOSEU", "HGBUR", "BILIRUBINUR", "KETONESUR", "PROTEINUR", "UROBILINOGEN", "NITRITE", "LEUKOCYTESUR" Sepsis Labs Recent Labs  Lab 07/07/22 1851 07/09/22 1720 07/10/22 0420  WBC 3.7* 5.6 4.6   Microbiology Recent Results (from the past 240 hour(s))  Eye culture w Gram Stain     Status: None (Preliminary result)   Collection Time: 07/09/22  4:00 PM   Specimen: Eye  Result Value Ref Range Status   Specimen Description EYE  Final   Special Requests NONE  Final   Gram Stain   Final  FEW WBC PRESENT, PREDOMINANTLY PMN RARE GRAM POSITIVE RODS    Culture   Final    NO GROWTH < 24 HOURS Performed at [facility] [address], [place], [place] [address]    Report Status PENDING  Incomplete  Culture, blood (Routine X 2) w Reflex to ID Panel     Status: None (Preliminary result)   Collection Time: 07/10/22  4:27 AM   Specimen: BLOOD  Result Value Ref Range Status   Specimen Description BLOOD SITE NOT SPECIFIED  Final   Special Requests   Final    BOTTLES DRAWN AEROBIC AND ANAEROBIC Blood Culture adequate volume   Culture   Final    NO GROWTH < 12 HOURS Performed at [facility], [place] [address], [place], [place] [address]    Report Status PENDING  Incomplete  Culture, blood (Routine X 2) w Reflex to ID Panel     Status: None (Preliminary result)   Collection Time: 07/10/22  4:28 AM   Specimen: BLOOD  Result Value Ref Range Status   Specimen Description BLOOD SITE NOT SPECIFIED  Final   Special Requests   Final     BOTTLES DRAWN AEROBIC AND ANAEROBIC Blood Culture adequate volume   Culture   Final    NO GROWTH < 12 HOURS Performed at [facility], [place] [address] [place], [place] [address]    Report Status PENDING  Incomplete      SIGNED:   Charlynne Cousins, MD  [facility] 07/10/2022, 10:51 AM Pager   If 7PM-7AM, please contact night-coverage [email or web address] Password [email or web address]

## 2022-07-10 NOTE — Consult Note (Signed)
Date of Admission:  07/09/2022          Reason for Consult:  bacterial conjunctivitis and orbital cellultis   Referring Provider: Charlynne Cousins ,MD   Assessment:  Bacterial endophthalmitis  orbital cellulitis context of right corneal ulceration Orbital apex syndreme End-stage renal disease on peritoneal dialysis Multiple myeloma on chemotherapy Somnolence  Plan:  Would expect patient needs intravitreal antibiotics and likely enucleation of the right eye I support transfer to tertiary care center I am skeptical of HSV or VZV being behind this pathology given the patient is on prophylactic acyclovir twice a day and there are no clear-cut lesions on skin or in the eye to definitively show that this is behind his pathology We will change his IV antibacterial antibiotics to vancomycin and cefepime --again I am not 100% against acyclovir (see above) Monitor for potential progression Needs dialysis Would consider ABG he may have undiagnosed OSA  Principal Problem:   Acute endophthalmitis of right eye Active Problems:   Multiple myeloma without remission (Ernest Barnett)   COPD (chronic obstructive pulmonary disease) (Ernest Barnett)   ESRD on peritoneal dialysis (Ernest Barnett)   Orbital cellulitis on right   Corneal ulcer with hypopyon, right eye   Thrombocytopenia (Ernest Barnett)   Scheduled Meds:  aspirin EC  81 mg Oral Daily   atropine  1 drop Right Eye QID   gentamicin FORTIFIED  1 drop Right Eye Q1H   Followed by   gentamicin FORTIFIED  1 drop Right Eye Q4H    HYDROmorphone (DILAUDID) injection  0.5 mg Intravenous Once   mometasone-formoterol  2 puff Inhalation BID   pravastatin  80 mg Oral Daily   sodium chloride flush  3 mL Intravenous Q12H   tamsulosin  0.4 mg Oral Daily   vancomycin  1 drop Ophthalmic Q1H   Followed by   vancomycin  1 drop Ophthalmic Q4H   vancomycin variable dose per unstable renal function (pharmacist dosing)   Does not apply See admin instructions   Continuous  Infusions:  cefTRIAXone (ROCEPHIN)  IV     metronidazole     PRN Meds:.acetaminophen **OR** acetaminophen, albuterol, ondansetron **OR** ondansetron (ZOFRAN) IV, senna-docusate  HPI: Ernest Barnett is a 66 y.o. male with hx of ESRD due to multiple myeloma, ESRD on PD, hx of CVA HLD prior retinal detachment in left eye, who had presented on 07/08/2022 with acute visual changes and right eye pain, he was evaluated with bedside Ernest Barnett. NO FB or abrasion was found. Arrangements were made for the patient to be seen by Dr. Prudencio Burly. In speaking with his wife and going over the eyedrops the patient was prescribed it sounds like it is largely given patient's for glaucoma.  Patient was then seen by Dr. Zadie Rhine.  He was found to have acute endophthalmitis in the right side as well and has orbital cellulitis with cranial nerves II III and 6 involved with concern for orbital apex syndrome.  Patient had a corneal ulcer found. He had no light perception and eye felt to not be salvageable.  Patient was to the emergency department for admission and consultation with ENT our team and administration of IV antibiotics and level antibiotics.  I would expect patient's going to need intravitreal antibiotics versus enucleation of the eye in particular since vision seems to be permanently lost.  The idea of varicella-zoster or HSV being behind this pathology seems unlikely given the fact the patient was on prophylactic acyclovir.  He does not have any herpetic lesions  visible outside of the eye and I do not see any notes from ophthalmology showing that the clear DC.  She is identified.  We will go to IV cefepime and vancomycin though Intravitreal antibiotics  We have discontinued acyclovir since we do not think that is herpes virus behind this pathology though if somone  but wanted to reinstated usually not again.  Plans now being made there to be sent to [facility].  He is fairly somnolent when I saw him more so for the  family that he is usually.  This could be certainly related to his having not had dialysis for 1 day he also may have underlying sleep apnea.  I would recommend checking ABG  I spent  121 minutes with the patient including than 50% of the time in face to face counseling of the patient and his wife son and daughter, personally reviewing CT orbits along with review of medical records in preparation for the visit and during the visit and in coordination of his care.      Review of Systems: Review of Systems  Constitutional:  Negative for chills, fever, malaise/fatigue and weight loss.  HENT:  Negative for congestion and sore throat.   Eyes:  Positive for photophobia, pain, discharge and redness. Negative for blurred vision.  Respiratory:  Negative for cough, shortness of breath and wheezing.   Cardiovascular:  Negative for chest pain, palpitations and leg swelling.  Gastrointestinal:  Negative for abdominal pain, blood in stool, constipation, diarrhea, heartburn, melena, nausea and vomiting.  Genitourinary:  Negative for dysuria, flank pain and hematuria.  Musculoskeletal:  Negative for back pain, falls, joint pain and myalgias.  Skin:  Negative for itching and rash.  Neurological:  Negative for dizziness, focal weakness, loss of consciousness, weakness and headaches.  Endo/Heme/Allergies:  Does not bruise/bleed easily.  Psychiatric/Behavioral:  Negative for depression and suicidal ideas. The patient does not have insomnia.     Past Medical History:  Diagnosis Date   Chronic kidney disease    COPD (chronic obstructive pulmonary disease) ([place])    CVA (cerebral vascular accident) (Ernest Barnett) 2013   Cystoid macular edema of left eye 03/07/2021   GERD (gastroesophageal reflux disease)    HOH (hard of hearing)    Hypercholesteremia    Hypertension    Left epiretinal membrane 03/07/2021   Pseudophakia 03/07/2021   Stroke (Ernest Barnett) 03/24/2012   left sided weakness   Vitamin D deficiency     Social  History   Tobacco Use   Smoking status: Former    Packs/day: 3.00    Years: 30.00    Total pack years: 90.00    Types: Cigarettes   Smokeless tobacco: Current   Tobacco comments:    Currently vape  Vaping Use   Vaping Use: Every day   Substances: Nicotine  Substance Use Topics   Alcohol use: Not Currently    Alcohol/week: 1.0 - 2.0 standard drink of alcohol    Types: 1 - 2 Cans of beer per week    Comment: Occasionally   Drug use: No    History reviewed. No pertinent family history. No Known Allergies  OBJECTIVE: Blood pressure 101/67, pulse (!) 102, temperature 98.3 F (36.8 C), temperature source Oral, resp. rate 20, height '5\' 7"'  (1.702 m), weight 115.2 kg, SpO2 94 %.  Physical Exam Constitutional:      Appearance: He is well-developed. He is obese.  HENT:     Head: Normocephalic and atraumatic.  Eyes:  Conjunctiva/sclera: Conjunctivae normal.  Cardiovascular:     Rate and Rhythm: Normal rate and regular rhythm.  Pulmonary:     Effort: Pulmonary effort is normal. No respiratory distress.     Breath sounds: No wheezing.  Abdominal:     General: There is no distension.     Palpations: Abdomen is soft.  Musculoskeletal:        General: No tenderness. Normal range of motion.     Cervical back: Normal range of motion and neck supple.  Skin:    General: Skin is warm and dry.     Coloration: Skin is not pale.     Findings: No erythema or rash.  Neurological:     Mental Status: He is oriented to person, place, and time.  Psychiatric:        Mood and Affect: Mood normal.        Behavior: Behavior normal.        Thought Content: Thought content normal.        Cognition and Memory: Cognition and memory normal.        Judgment: Judgment normal.     Comments: Very sleepy but arousable with children present and with hearing aids    Right eye 07/10/2022:       Lab Results Lab Results  Component Value Date   WBC 4.6 07/10/2022   HGB 11.8 (L) 07/10/2022    HCT 35.3 (L) 07/10/2022   MCV 102.3 (H) 07/10/2022   PLT 91 (L) 07/10/2022    Lab Results  Component Value Date   CREATININE 3.50 (H) 07/10/2022   BUN 32 (H) 07/10/2022   NA 137 07/10/2022   K 3.4 (L) 07/10/2022   CL 109 07/10/2022   CO2 17 (L) 07/10/2022    Lab Results  Component Value Date   ALT 21 07/09/2022   AST 14 (L) 07/09/2022   ALKPHOS 54 07/09/2022   BILITOT 0.6 07/09/2022     Microbiology: Recent Results (from the past 240 hour(s))  Eye culture w Gram Stain     Status: None (Preliminary result)   Collection Time: 07/09/22  4:00 PM   Specimen: Eye  Result Value Ref Range Status   Specimen Description EYE  Final   Special Requests NONE  Final   Gram Stain   Final    FEW WBC PRESENT, PREDOMINANTLY PMN RARE GRAM POSITIVE RODS    Culture   Final    NO GROWTH < 24 HOURS Performed at [facility], [place] [address] [place], [place] [address]    Report Status PENDING  Incomplete  Culture, blood (Routine X 2) w Reflex to ID Panel     Status: None (Preliminary result)   Collection Time: 07/10/22  4:27 AM   Specimen: BLOOD  Result Value Ref Range Status   Specimen Description BLOOD SITE NOT SPECIFIED  Final   Special Requests   Final    BOTTLES DRAWN AEROBIC AND ANAEROBIC Blood Culture adequate volume   Culture   Final    NO GROWTH < 12 HOURS Performed at [facility], [place] [address], [place], [place] [address]    Report Status PENDING  Incomplete  Culture, blood (Routine X 2) w Reflex to ID Panel     Status: None (Preliminary result)   Collection Time: 07/10/22  4:28 AM   Specimen: BLOOD  Result Value Ref Range Status   Specimen Description BLOOD SITE NOT SPECIFIED  Final   Special Requests   Final  BOTTLES DRAWN AEROBIC AND ANAEROBIC Blood Culture adequate volume   Culture   Final    NO GROWTH < 12 HOURS Performed at [facility], [place] [address], [place], [place] [address]    Report Status PENDING  Incomplete    Alcide Evener,  [facility] [phone] pager  07/10/2022, 11:20 AM

## 2022-07-10 NOTE — Progress Notes (Signed)
[facility] PROGRESS NOTE    Progress Note  Ernest Barnett  TJQ:[id number] DOB: 1956-10-19 DOA: 07/09/2022 PCP: Lindell Spar, MD     Brief Narrative:   Ernest Barnett is an 66 y.o. male with past medical history for end-stage renal disease on peritoneal dialysis due to multiple myeloma COPD, history of CVA, thrombocytopenia presents to the ED for right eye infection with vision loss, previously seen on September 3 he was seen in follow-up by Dr. Gillian Scarce he did not keep his follow-up appointment now with right-sided endophthalmitis and orbital cellulitis with corneal evisceration and a 6th nerve palsy and 3rd nerve palsy.  MRI is unable to be performed due to metallic piece of the peritoneal catheter, CT of the orbit with contrast showed preseptal cellulitis and early orbital cellulitis, CT angio of head and neck showed no intracranial large vessel occlusion moderate stenosis of the left M1 there is a duction of the right vocal fold with medialization of the left vocal cord concerning for right vocal cord paralysis   Assessment/Plan:   Acute endophthalmitis of right eye/ Orbital cellulitis on right/Corneal ulcer with hypopyon, right eye: Ophthalmology was consulted they are concerned about bacterial infection also concerned about  zoster infection with a secondary bacterial infection. In an immunocompromise patient with multiple myeloma. About herpes zoster, currently on IV acyclovir, he also has decreased hearing, question Ramsay Hunt syndrome. On eye exam he could barely perceive the light, minimal elevation of the right eye looking up or down and minimal abduction. I have discussed the case with the ENT and his imaging does not show any sinus involvement they recommended to continue antibiotics. I have personally spoken with Dr. Zadie Rhine ophthalmology who saw the patient as an outpatient and will see the patient and agreed with IV antibiotics Rocephin,  IV vancomycin, ophthalmic eyedrops  gentamicin and vancomycin and IV acyclovir. I have also consulted infectious disease. Dr. Zadie Rhine is discussing with [facility] for possible transfer. Start atropine drops. Allow a diet.  ESRD on peritoneal dialysis [facility]) Nephrology has been notified.  Multiple myeloma without remission ([place]) We will discuss with ID to see if we need to hold immunosuppressive agents.  COPD (chronic obstructive pulmonary disease) (HCC) Continue inhalers.  Acute thrombocytopenia (HCC) Probably due to infectious etiology.  DVT prophylaxis: lovenox Family Communication: Margorie John Status is: Inpatient Remains inpatient appropriate because: For acute orbital cellulitis    Code Status:     Code Status Orders  (From admission, onward)           Start     Ordered   07/10/22 0029  Do not attempt resuscitation (DNR)  Continuous       Question Answer Comment  In the event of cardiac or respiratory ARREST Do not call a "code blue"   In the event of cardiac or respiratory ARREST Do not perform Intubation, CPR, defibrillation or ACLS   In the event of cardiac or respiratory ARREST Use medication by any route, position, wound care, and other measures to relive pain and suffering. May use oxygen, suction and manual treatment of airway obstruction as needed for comfort.      07/10/22 0030           Code Status History     This patient has a current code status but no historical code status.         IV Access:   Peripheral IV   Procedures and diagnostic studies:   CT Orbits W Contrast  Result Date: 07/09/2022 CLINICAL DATA:  Orbital cellulitis suspected EXAM: CT ORBITS WITH CONTRAST TECHNIQUE: Multidetector CT images was performed according to the standard protocol following intravenous contrast administration. RADIATION DOSE REDUCTION: This exam was performed according to the departmental dose-optimization program which includes automated exposure control, adjustment of the mA and/or kV  according to patient size and/or use of iterative reconstruction technique. CONTRAST:  36mL OMNIPAQUE IOHEXOL 350 MG/ML SOLN COMPARISON:  None Available. FINDINGS: Orbits: No orbital mass. Mild hyperenhancement and thickening of the sclera of the right globe (series 31, image 27). Minimal fat stranding posterior to the right globe (series 33, image 29). No definite hyperenhancement of the right optic nerve sheath complex. Normal appearance of the extra-ocular muscles. Right proptosis. Normal appearance of the left orbit. No evidence of postseptal collection or subperiosteal abscess status post bilateral lens replacements. Visible paranasal sinuses: Clear. Soft tissues: Thickening and enhancement of the right eyelid and periorbital soft tissues. Osseous: No fracture or aggressive lesion. Limited intracranial: Please see same day CTA head neck. IMPRESSION: Right periorbital soft tissue enhancement and thickening, consistent with preseptal cellulitis, with hyperenhancement and thickening of the right sclera and minimal fat stranding posterior to the right globe, concerning for early orbital cellulitis. Electronically Signed   By: Merilyn Baba M.D.   On: 07/09/2022 23:10   CT VENOGRAM HEAD  Result Date: 07/09/2022 CLINICAL DATA:  Stroke suspected EXAM: CT ANGIOGRAPHY HEAD AND NECK CT VENOGRAM HEAD TECHNIQUE: Multidetector CT imaging of the head and neck was performed using the standard protocol during bolus administration of intravenous contrast. Multiplanar CT image reconstructions and MIPs were obtained to evaluate the vascular anatomy. Carotid stenosis measurements (when applicable) are obtained utilizing NASCET criteria, using the distal internal carotid diameter as the denominator. Venographic phase images of the brain were obtained following the administration of intravenous contrast. Multiplanar reformats and maximum intensity projections were generated. RADIATION DOSE REDUCTION: This exam was performed  according to the departmental dose-optimization program which includes automated exposure control, adjustment of the mA and/or kV according to patient size and/or use of iterative reconstruction technique. CONTRAST:  18mL OMNIPAQUE IOHEXOL 350 MG/ML SOLN COMPARISON:  None Available. FINDINGS: CT HEAD FINDINGS Brain: No evidence of acute infarct, hemorrhage, mass, mass effect, or midline shift. No hydrocephalus or extra-axial fluid collection. Encephalomalacia in the anterior right frontal lobe. Periventricular white matter changes, likely the sequela of chronic small vessel ischemic disease. Remote lacunar infarcts in the pons Vascular: No hyperdense vessel. Skull: Normal. Negative for fracture or focal lesion. Sinuses/Orbits: No acute finding in the sinuses. Please see same-day CT orbits for orbital findings. Other: The mastoids are not pneumatized. CTA NECK FINDINGS Aortic arch: Two-vessel arch with a common origin of the brachiocephalic and left common carotid arteries. Imaged portion shows no evidence of aneurysm or dissection. No significant stenosis of the major arch vessel origins. Mild aortic atherosclerosis. Right carotid system: No evidence of dissection, occlusion, or hemodynamically significant stenosis (greater than 50%). Atherosclerotic disease at the bifurcation and in the proximal ICA is not hemodynamically significant. Left carotid system: No evidence of dissection, occlusion, or hemodynamically significant stenosis (greater than 50%). Atherosclerotic disease at the bifurcation and in the proximal ICA is not hemodynamically significant. Significant tortuosity of the left ICA. Vertebral arteries: Evaluation of the proximal vertebral arteries bilaterally is somewhat limited by beam hardening artifact from the contrast bolus. Within this limitation, no evidence of dissection, occlusion, or hemodynamically significant stenosis (greater than 50%). Skeleton: No acute osseous abnormality. Degenerative  changes in  the cervical spine. Edentulous. Other neck: Medialization of the left vocal fold with abduction of the right vocal fold and enlargement of the right piriform sinus. Upper chest: No acute finding. Review of the MIP images confirms the above findings CTA HEAD FINDINGS Anterior circulation: Both internal carotid arteries are patent to the termini, without significant stenosis. Fusiform dilatation of the right cavernous carotid which measures up to 8 mm (series 18, image 118 and series 20, image 79), compared to 6 mm more proximally (series 20, image 82). A1 segments patent. Normal anterior communicating artery. Anterior cerebral arteries are irregular but patent to their distal aspects. Long segment moderate narrowing in the proximal left M1 (series 18, image 100) MCA branches perfused and symmetric. Posterior circulation: Vertebral arteries patent to the vertebrobasilar junction without stenosis. Posterior inferior cerebellar arteries patent proximally. Basilar patent to its distal aspect. Superior cerebellar arteries patent proximally. Patent but diminutive P1 segments. Patent bilateral posterior communicating arteries with near fetal origin of the bilateral PCAs. Multifocal mild narrowing and irregularity are noted in the left posterior communicating artery and bilateral PCAs. PCAs perfused to their distal aspects. Anatomic variants: Near fetal origin of the bilateral PCAs. Review of the MIP images confirms the above findings CTV FINDINGS Superior sagittal sinus: Normal. Straight sinus: Normal. Inferior sagittal sinus, vein of Galen and internal cerebral veins: Normal. Transverse sinuses: Normal. Sigmoid sinuses: Normal. Visualized jugular veins: Normal IMPRESSION: 1. No intracranial large vessel occlusion. Moderate stenosis in the left M1 and mild multifocal narrowing in the bilateral PCAs. 2. Fusiform dilatation of the right cavernous carotid, measuring up to 8 mm 3.  No hemodynamically significant  stenosis in the neck. 4.  No acute intracranial process. 5. Abduction of the right vocal fold with medial ization of the left vocal fold, concerning for right vocal cord paralysis. 6. No evidence of intracranial venous sinus thrombosis or stenosis. 7. For orbital findings, please see same-day CT orbits. Electronically Signed   By: Merilyn Baba M.D.   On: 07/09/2022 22:59   CT ANGIO HEAD NECK W WO CM  Result Date: 07/09/2022 CLINICAL DATA:  Stroke suspected EXAM: CT ANGIOGRAPHY HEAD AND NECK CT VENOGRAM HEAD TECHNIQUE: Multidetector CT imaging of the head and neck was performed using the standard protocol during bolus administration of intravenous contrast. Multiplanar CT image reconstructions and MIPs were obtained to evaluate the vascular anatomy. Carotid stenosis measurements (when applicable) are obtained utilizing NASCET criteria, using the distal internal carotid diameter as the denominator. Venographic phase images of the brain were obtained following the administration of intravenous contrast. Multiplanar reformats and maximum intensity projections were generated. RADIATION DOSE REDUCTION: This exam was performed according to the departmental dose-optimization program which includes automated exposure control, adjustment of the mA and/or kV according to patient size and/or use of iterative reconstruction technique. CONTRAST:  36m OMNIPAQUE IOHEXOL 350 MG/ML SOLN COMPARISON:  None Available. FINDINGS: CT HEAD FINDINGS Brain: No evidence of acute infarct, hemorrhage, mass, mass effect, or midline shift. No hydrocephalus or extra-axial fluid collection. Encephalomalacia in the anterior right frontal lobe. Periventricular white matter changes, likely the sequela of chronic small vessel ischemic disease. Remote lacunar infarcts in the pons Vascular: No hyperdense vessel. Skull: Normal. Negative for fracture or focal lesion. Sinuses/Orbits: No acute finding in the sinuses. Please see same-day CT orbits for  orbital findings. Other: The mastoids are not pneumatized. CTA NECK FINDINGS Aortic arch: Two-vessel arch with a common origin of the brachiocephalic and left common carotid arteries. Imaged portion shows no evidence  of aneurysm or dissection. No significant stenosis of the major arch vessel origins. Mild aortic atherosclerosis. Right carotid system: No evidence of dissection, occlusion, or hemodynamically significant stenosis (greater than 50%). Atherosclerotic disease at the bifurcation and in the proximal ICA is not hemodynamically significant. Left carotid system: No evidence of dissection, occlusion, or hemodynamically significant stenosis (greater than 50%). Atherosclerotic disease at the bifurcation and in the proximal ICA is not hemodynamically significant. Significant tortuosity of the left ICA. Vertebral arteries: Evaluation of the proximal vertebral arteries bilaterally is somewhat limited by beam hardening artifact from the contrast bolus. Within this limitation, no evidence of dissection, occlusion, or hemodynamically significant stenosis (greater than 50%). Skeleton: No acute osseous abnormality. Degenerative changes in the cervical spine. Edentulous. Other neck: Medialization of the left vocal fold with abduction of the right vocal fold and enlargement of the right piriform sinus. Upper chest: No acute finding. Review of the MIP images confirms the above findings CTA HEAD FINDINGS Anterior circulation: Both internal carotid arteries are patent to the termini, without significant stenosis. Fusiform dilatation of the right cavernous carotid which measures up to 8 mm (series 18, image 118 and series 20, image 79), compared to 6 mm more proximally (series 20, image 82). A1 segments patent. Normal anterior communicating artery. Anterior cerebral arteries are irregular but patent to their distal aspects. Long segment moderate narrowing in the proximal left M1 (series 18, image 100) MCA branches perfused and  symmetric. Posterior circulation: Vertebral arteries patent to the vertebrobasilar junction without stenosis. Posterior inferior cerebellar arteries patent proximally. Basilar patent to its distal aspect. Superior cerebellar arteries patent proximally. Patent but diminutive P1 segments. Patent bilateral posterior communicating arteries with near fetal origin of the bilateral PCAs. Multifocal mild narrowing and irregularity are noted in the left posterior communicating artery and bilateral PCAs. PCAs perfused to their distal aspects. Anatomic variants: Near fetal origin of the bilateral PCAs. Review of the MIP images confirms the above findings CTV FINDINGS Superior sagittal sinus: Normal. Straight sinus: Normal. Inferior sagittal sinus, vein of Galen and internal cerebral veins: Normal. Transverse sinuses: Normal. Sigmoid sinuses: Normal. Visualized jugular veins: Normal IMPRESSION: 1. No intracranial large vessel occlusion. Moderate stenosis in the left M1 and mild multifocal narrowing in the bilateral PCAs. 2. Fusiform dilatation of the right cavernous carotid, measuring up to 8 mm 3.  No hemodynamically significant stenosis in the neck. 4.  No acute intracranial process. 5. Abduction of the right vocal fold with medial ization of the left vocal fold, concerning for right vocal cord paralysis. 6. No evidence of intracranial venous sinus thrombosis or stenosis. 7. For orbital findings, please see same-day CT orbits. Electronically Signed   By: Merilyn Baba M.D.   On: 07/09/2022 22:59   B-Scan Ultrasound - OS - Left Eye  Result Date: 07/09/2022 Quality was good. Notes Optically empty vitreous cavity.  No retinal tears or detachment.  No obvious infiltrates or debris    Medical Consultants:   None.   Subjective:    Ernest Barnett complaining of right eye pain  Objective:    Vitals:   07/10/22 0400 07/10/22 0500 07/10/22 0530 07/10/22 0545  BP: 111/73 103/75 98/68   Pulse: (!) 103 (!) 106 98    Resp:  (!) 6  19  Temp:      TempSrc:      SpO2:  91% 91%   Weight:      Height:       SpO2: 91 %  No intake or  output data in the 24 hours ending 07/10/22 0659 Filed Weights   07/09/22 1557  Weight: 115.2 kg    Exam: General exam: In no acute distress. Respiratory system: Good air movement and clear to auscultation. Eye: There is limited mobility of the right eye up and down and also minimal AB duction, he relates he could barely perceive the light. Cardiovascular system: S1 & S2 heard, RRR. No JVD, murmurs, rubs, gallops or clicks.  Gastrointestinal system: Abdomen is nondistended, soft and nontender.  Extremities: No pedal edema. Skin: No rashes, lesions or ulcers   Data Reviewed:    Labs: Basic Metabolic Panel: Recent Labs  Lab 07/07/22 1851 07/09/22 2120 07/10/22 0420  NA 134* 140 137  K 3.5 3.5 3.4*  CL 103 109 109  CO2 22 19* 17*  GLUCOSE 109* 127* 118*  BUN 39* 31* 32*  CREATININE 3.99* 3.61* 3.50*  CALCIUM 8.8* 9.6 9.3   GFR Estimated Creatinine Clearance: 25.5 mL/min (A) (by C-G formula based on SCr of 3.5 mg/dL (H)). Liver Function Tests: Recent Labs  Lab 07/07/22 1851 07/09/22 2120  AST 15 14*  ALT 22 21  ALKPHOS 51 54  BILITOT 0.8 0.6  PROT 6.6 6.5  ALBUMIN 3.7 3.4*   No results for input(s): "LIPASE", "AMYLASE" in the last 168 hours. No results for input(s): "AMMONIA" in the last 168 hours. Coagulation profile Recent Labs  Lab 07/07/22 1851  INR 1.0   COVID-19 Labs  No results for input(s): "DDIMER", "FERRITIN", "LDH", "CRP" in the last 72 hours.  Lab Results  Component Value Date   SARSCOV2NAA NEGATIVE 02/07/2021   Ramirez-Perez NEGATIVE 05/09/2020    CBC: Recent Labs  Lab 07/07/22 1851 07/09/22 1720 07/10/22 0420  WBC 3.7* 5.6 4.6  NEUTROABS 1.3*  --   --   HGB 11.9* 12.5* 11.8*  HCT 33.8* 36.4* 35.3*  MCV 99.4 101.7* 102.3*  PLT 101* 93* 91*   Cardiac Enzymes: No results for input(s): "CKTOTAL", "CKMB",  "CKMBINDEX", "TROPONINI" in the last 168 hours. BNP (last 3 results) No results for input(s): "PROBNP" in the last 8760 hours. CBG: Recent Labs  Lab 07/07/22 1850  GLUCAP 111*   D-Dimer: No results for input(s): "DDIMER" in the last 72 hours. Hgb A1c: No results for input(s): "HGBA1C" in the last 72 hours. Lipid Profile: No results for input(s): "CHOL", "HDL", "LDLCALC", "TRIG", "CHOLHDL", "LDLDIRECT" in the last 72 hours. Thyroid function studies: No results for input(s): "TSH", "T4TOTAL", "T3FREE", "THYROIDAB" in the last 72 hours.  Invalid input(s): "FREET3" Anemia work up: No results for input(s): "VITAMINB12", "FOLATE", "FERRITIN", "TIBC", "IRON", "RETICCTPCT" in the last 72 hours. Sepsis Labs: Recent Labs  Lab 07/07/22 1851 07/09/22 1720 07/10/22 0420  WBC 3.7* 5.6 4.6  LATICACIDVEN  --  0.8  --    Microbiology Recent Results (from the past 240 hour(s))  Eye culture w Gram Stain     Status: None (Preliminary result)   Collection Time: 07/09/22  4:00 PM   Specimen: Eye  Result Value Ref Range Status   Specimen Description EYE  Final   Special Requests NONE  Final   Gram Stain   Final    FEW WBC PRESENT, PREDOMINANTLY PMN RARE GRAM POSITIVE RODS Performed at [facility], [place] [address], [place], [place] [address]    Culture PENDING  Incomplete   Report Status PENDING  Incomplete     Medications:    aspirin EC  81 mg Oral Daily   gentamicin FORTIFIED  1  drop Right Eye Q1H   Followed by   gentamicin FORTIFIED  1 drop Right Eye Q4H    HYDROmorphone (DILAUDID) injection  0.5 mg Intravenous Once   mometasone-formoterol  2 puff Inhalation BID   pravastatin  80 mg Oral Daily   sodium chloride flush  3 mL Intravenous Q12H   tamsulosin  0.4 mg Oral Daily   vancomycin  1 drop Ophthalmic Q1H   Followed by   vancomycin  1 drop Ophthalmic Q4H   vancomycin variable dose per unstable renal function (pharmacist dosing)   Does not apply See admin instructions    Continuous Infusions:  [START ON 07/11/2022] acyclovir     cefTRIAXone (ROCEPHIN)  IV     lactated ringers 50 mL/hr at 07/10/22 0250   metronidazole        LOS: 1 day   Charlynne Cousins  [facility]  07/10/2022, 6:59 AM

## 2022-07-10 NOTE — Progress Notes (Signed)
Pharmacy Antimicrobial Note  Ernest Barnett is a 66 y.o. male admitted on 07/09/2022 with eye infection.  Pharmacy has been consulted for acyclovir dosing.  Plan: Acyclovir '1000mg'$  IV x1 then '500mg'$  IV Q24H. Maintain IVF.  Height: '5\' 7"'$  (170.2 cm) Weight: 115.2 kg (254 lb) IBW/kg (Calculated) : 66.1  Temp (24hrs), Avg:98.1 F (36.7 C), Min:98 F (36.7 C), Max:98.2 F (36.8 C)  Recent Labs  Lab 07/07/22 1851 07/09/22 1720 07/09/22 2120  WBC 3.7* 5.6  --   CREATININE 3.99*  --  3.61*  LATICACIDVEN  --  0.8  --     Estimated Creatinine Clearance: 24.7 mL/min (A) (by C-G formula based on SCr of 3.61 mg/dL (H)).    No Known Allergies   Thank you for allowing pharmacy to be a part of this patient's care.  Wynona Neat, PharmD, BCPS  07/10/2022 1:14 AM

## 2022-07-10 NOTE — Assessment & Plan Note (Signed)
Stable, continue Dulera and albuterol as needed.

## 2022-07-10 NOTE — Progress Notes (Signed)
      INFECTIOUS DISEASE ATTENDING ADDENDUM:   Date: 07/10/2022  Patient name: Ernest Barnett  Medical record number: [id number]  Date of birth: 12/27/1955   I had extensive discussion with Katy Apo, MD re this patient's presentations, exam findings that he has shared with several specialists at tertiary care centers, with review of his immunosuppressive medications and certainly there appears to be sufficient evidence to consider ophthalmic zoster or even disseminated zoster though would be a zoster sin herpete and in face of acyclovir. We will not harm him with ACV so I am restarting it and sending VZV PCR on serum.    Rhina Brackett Dam 07/10/2022, 1:27 PM

## 2022-07-10 NOTE — Assessment & Plan Note (Signed)
Follows with oncology, Dr. Delton Coombes.  On active therapy with daratumumab and bortezomib.

## 2022-07-10 NOTE — Assessment & Plan Note (Signed)
Mild without obvious bleeding.  Continue to monitor. 

## 2022-07-11 ENCOUNTER — Telehealth: Payer: Self-pay | Admitting: *Deleted

## 2022-07-11 DIAGNOSIS — Z992 Dependence on renal dialysis: Secondary | ICD-10-CM | POA: Diagnosis not present

## 2022-07-11 DIAGNOSIS — N186 End stage renal disease: Secondary | ICD-10-CM | POA: Diagnosis not present

## 2022-07-11 NOTE — Telephone Encounter (Signed)
Transition Care Management Unsuccessful Follow-up Telephone Call  Date of discharge and from where:  07-10-22 [facility]  Attempts:  1st Attempt  Reason for unsuccessful TCM follow-up call:  Unable to leave message

## 2022-07-12 DIAGNOSIS — Z992 Dependence on renal dialysis: Secondary | ICD-10-CM | POA: Diagnosis not present

## 2022-07-12 DIAGNOSIS — N186 End stage renal disease: Secondary | ICD-10-CM | POA: Diagnosis not present

## 2022-07-14 DIAGNOSIS — N186 End stage renal disease: Secondary | ICD-10-CM | POA: Diagnosis not present

## 2022-07-14 DIAGNOSIS — Z992 Dependence on renal dialysis: Secondary | ICD-10-CM | POA: Diagnosis not present

## 2022-07-14 LAB — EYE CULTURE W GRAM STAIN

## 2022-07-15 ENCOUNTER — Telehealth: Payer: Self-pay | Admitting: *Deleted

## 2022-07-15 DIAGNOSIS — N186 End stage renal disease: Secondary | ICD-10-CM | POA: Diagnosis not present

## 2022-07-15 DIAGNOSIS — Z992 Dependence on renal dialysis: Secondary | ICD-10-CM | POA: Diagnosis not present

## 2022-07-15 LAB — CULTURE, BLOOD (ROUTINE X 2)
Culture: NO GROWTH
Culture: NO GROWTH
Special Requests: ADEQUATE
Special Requests: ADEQUATE

## 2022-07-15 NOTE — Telephone Encounter (Signed)
Post ED Visit - Positive Culture Follow-up  Culture report reviewed by antimicrobial stewardship pharmacist: [facility] Team '[]'$  Elenor Quinones, Pharm.D. '[]'$  Heide Guile, Pharm.D., BCPS AQ-ID '[]'$  Parks Neptune, Pharm.D., BCPS '[]'$  Alycia Rossetti, Pharm.D., BCPS '[]'$  Brent, Pharm.D., BCPS, AAHIVP '[]'$  Legrand Como, Pharm.D., BCPS, AAHIVP '[]'$  Salome Arnt, PharmD, BCPS '[]'$  Johnnette Gourd, PharmD, BCPS '[]'$  Hughes Better, PharmD, BCPS '[]'$  Leeroy Cha, PharmD '[]'$  Laqueta Linden, PharmD, BCPS '[]'$  Albertina Parr, PharmD  [place] Team '[]'$  Leodis Sias, PharmD '[]'$  Lindell Spar, PharmD '[]'$  Royetta Asal, PharmD '[]'$  Graylin Shiver, Rph '[]'$  Rema Fendt) Glennon Mac, PharmD '[]'$  Arlyn Dunning, PharmD '[]'$  Netta Cedars, PharmD '[]'$  Dia Sitter, PharmD '[]'$  Leone Haven, PharmD '[]'$  Gretta Arab, PharmD '[]'$  Theodis Shove, PharmD '[]'$  Peggyann Juba, PharmD '[]'$  Reuel Boom, PharmD   Positive eye culture Treated with Vancomycin, organism sensitive to the same and no further patient follow-up is required at this time. Luisa Hart, PharmD  Ernest Barnett 07/15/2022, 10:26 AM

## 2022-07-16 DIAGNOSIS — Z992 Dependence on renal dialysis: Secondary | ICD-10-CM | POA: Diagnosis not present

## 2022-07-16 DIAGNOSIS — N186 End stage renal disease: Secondary | ICD-10-CM | POA: Diagnosis not present

## 2022-07-17 DIAGNOSIS — Z992 Dependence on renal dialysis: Secondary | ICD-10-CM | POA: Diagnosis not present

## 2022-07-17 DIAGNOSIS — N186 End stage renal disease: Secondary | ICD-10-CM | POA: Diagnosis not present

## 2022-07-18 DIAGNOSIS — Z992 Dependence on renal dialysis: Secondary | ICD-10-CM | POA: Diagnosis not present

## 2022-07-18 DIAGNOSIS — N186 End stage renal disease: Secondary | ICD-10-CM | POA: Diagnosis not present

## 2022-07-19 DIAGNOSIS — D631 Anemia in chronic kidney disease: Secondary | ICD-10-CM | POA: Diagnosis not present

## 2022-07-19 DIAGNOSIS — N186 End stage renal disease: Secondary | ICD-10-CM | POA: Diagnosis not present

## 2022-07-19 DIAGNOSIS — D649 Anemia, unspecified: Secondary | ICD-10-CM | POA: Diagnosis not present

## 2022-07-19 DIAGNOSIS — H15009 Unspecified scleritis, unspecified eye: Secondary | ICD-10-CM | POA: Diagnosis not present

## 2022-07-19 DIAGNOSIS — D696 Thrombocytopenia, unspecified: Secondary | ICD-10-CM | POA: Diagnosis not present

## 2022-07-19 DIAGNOSIS — Z992 Dependence on renal dialysis: Secondary | ICD-10-CM | POA: Diagnosis not present

## 2022-07-19 DIAGNOSIS — H15031 Posterior scleritis, right eye: Secondary | ICD-10-CM | POA: Diagnosis not present

## 2022-07-21 DIAGNOSIS — N186 End stage renal disease: Secondary | ICD-10-CM | POA: Diagnosis not present

## 2022-07-21 DIAGNOSIS — Z992 Dependence on renal dialysis: Secondary | ICD-10-CM | POA: Diagnosis not present

## 2022-07-22 ENCOUNTER — Telehealth: Payer: Self-pay | Admitting: *Deleted

## 2022-07-22 DIAGNOSIS — Z992 Dependence on renal dialysis: Secondary | ICD-10-CM | POA: Diagnosis not present

## 2022-07-22 DIAGNOSIS — N186 End stage renal disease: Secondary | ICD-10-CM | POA: Diagnosis not present

## 2022-07-22 NOTE — Patient Outreach (Signed)
  Care Coordination [facility] Note Transition Care Management Unsuccessful Follow-up Telephone Call  Date of discharge and from where:  07/19/22 from [facility]  Attempts:  1st Attempt  Reason for unsuccessful TCM follow-up call:  Voice mail full  Chong Sicilian, BSN, RN-BC RN Care Coordinator [place]: [phone] Main #: [phone]

## 2022-07-23 ENCOUNTER — Telehealth: Payer: Self-pay | Admitting: *Deleted

## 2022-07-23 DIAGNOSIS — N186 End stage renal disease: Secondary | ICD-10-CM | POA: Diagnosis not present

## 2022-07-23 DIAGNOSIS — H16011 Central corneal ulcer, right eye: Secondary | ICD-10-CM | POA: Diagnosis not present

## 2022-07-23 DIAGNOSIS — H15031 Posterior scleritis, right eye: Secondary | ICD-10-CM | POA: Diagnosis not present

## 2022-07-23 DIAGNOSIS — Z992 Dependence on renal dialysis: Secondary | ICD-10-CM | POA: Diagnosis not present

## 2022-07-23 NOTE — Patient Outreach (Signed)
  Care Coordination [facility] Note Transition Care Management Unsuccessful Follow-up Telephone Call  Date of discharge and from where:  [facility] on 07/19/22  Attempts:  2nd Attempt  Reason for unsuccessful TCM follow-up call:  Voice mail full  Chong Sicilian, BSN, RN-BC RN Care Coordinator [place]: [phone] Main #: [phone]

## 2022-07-24 ENCOUNTER — Telehealth: Payer: Self-pay | Admitting: *Deleted

## 2022-07-24 DIAGNOSIS — N186 End stage renal disease: Secondary | ICD-10-CM | POA: Diagnosis not present

## 2022-07-24 DIAGNOSIS — Z992 Dependence on renal dialysis: Secondary | ICD-10-CM | POA: Diagnosis not present

## 2022-07-24 NOTE — Patient Outreach (Signed)
  Care Coordination [facility] Note Transition Care Management Unsuccessful Follow-up Telephone Call  Date of discharge and from where:  [facility] on 07/19/22  Attempts:  3rd Attempt  Reason for unsuccessful TCM follow-up call:  No answer/busy  Chong Sicilian, BSN, RN-BC RN Care Coordinator [place]: [phone] Main #: [phone]

## 2022-07-25 ENCOUNTER — Inpatient Hospital Stay: Payer: Medicare HMO

## 2022-07-25 ENCOUNTER — Inpatient Hospital Stay (HOSPITAL_BASED_OUTPATIENT_CLINIC_OR_DEPARTMENT_OTHER): Payer: Medicare HMO | Admitting: Physician Assistant

## 2022-07-25 ENCOUNTER — Inpatient Hospital Stay: Payer: Medicare HMO | Attending: Hematology

## 2022-07-25 VITALS — BP 101/73 | HR 110 | Temp 98.2°F | Resp 18

## 2022-07-25 DIAGNOSIS — H5461 Unqualified visual loss, right eye, normal vision left eye: Secondary | ICD-10-CM

## 2022-07-25 DIAGNOSIS — D472 Monoclonal gammopathy: Secondary | ICD-10-CM

## 2022-07-25 DIAGNOSIS — M7989 Other specified soft tissue disorders: Secondary | ICD-10-CM | POA: Insufficient documentation

## 2022-07-25 DIAGNOSIS — C9000 Multiple myeloma not having achieved remission: Secondary | ICD-10-CM

## 2022-07-25 DIAGNOSIS — Z79899 Other long term (current) drug therapy: Secondary | ICD-10-CM | POA: Insufficient documentation

## 2022-07-25 DIAGNOSIS — N186 End stage renal disease: Secondary | ICD-10-CM | POA: Diagnosis not present

## 2022-07-25 DIAGNOSIS — Z87891 Personal history of nicotine dependence: Secondary | ICD-10-CM | POA: Diagnosis not present

## 2022-07-25 DIAGNOSIS — I10 Essential (primary) hypertension: Secondary | ICD-10-CM | POA: Diagnosis not present

## 2022-07-25 DIAGNOSIS — Z992 Dependence on renal dialysis: Secondary | ICD-10-CM | POA: Insufficient documentation

## 2022-07-25 LAB — CBC WITH DIFFERENTIAL/PLATELET
Abs Immature Granulocytes: 0 10*3/uL (ref 0.00–0.07)
Basophils Absolute: 0 10*3/uL (ref 0.0–0.1)
Basophils Relative: 0 %
Eosinophils Absolute: 0 10*3/uL (ref 0.0–0.5)
Eosinophils Relative: 0 %
HCT: 36.4 % — ABNORMAL LOW (ref 39.0–52.0)
Hemoglobin: 12.3 g/dL — ABNORMAL LOW (ref 13.0–17.0)
Lymphocytes Relative: 40 %
Lymphs Abs: 2.2 10*3/uL (ref 0.7–4.0)
MCH: 34.8 pg — ABNORMAL HIGH (ref 26.0–34.0)
MCHC: 33.8 g/dL (ref 30.0–36.0)
MCV: 103.1 fL — ABNORMAL HIGH (ref 80.0–100.0)
Monocytes Absolute: 0.1 10*3/uL (ref 0.1–1.0)
Monocytes Relative: 1 %
Neutro Abs: 3.2 10*3/uL (ref 1.7–7.7)
Neutrophils Relative %: 59 %
Platelets: 148 10*3/uL — ABNORMAL LOW (ref 150–400)
RBC: 3.53 MIL/uL — ABNORMAL LOW (ref 4.22–5.81)
RDW: 17.2 % — ABNORMAL HIGH (ref 11.5–15.5)
WBC: 5.5 10*3/uL (ref 4.0–10.5)
nRBC: 4.6 % — ABNORMAL HIGH (ref 0.0–0.2)
nRBC: 5 /100 WBC — ABNORMAL HIGH

## 2022-07-25 LAB — COMPREHENSIVE METABOLIC PANEL
ALT: 54 U/L — ABNORMAL HIGH (ref 0–44)
AST: 28 U/L (ref 15–41)
Albumin: 3.6 g/dL (ref 3.5–5.0)
Alkaline Phosphatase: 53 U/L (ref 38–126)
Anion gap: 12 (ref 5–15)
BUN: 48 mg/dL — ABNORMAL HIGH (ref 8–23)
CO2: 22 mmol/L (ref 22–32)
Calcium: 8.6 mg/dL — ABNORMAL LOW (ref 8.9–10.3)
Chloride: 101 mmol/L (ref 98–111)
Creatinine, Ser: 3.67 mg/dL — ABNORMAL HIGH (ref 0.61–1.24)
GFR, Estimated: 18 mL/min — ABNORMAL LOW (ref >60)
Glucose, Bld: 132 mg/dL — ABNORMAL HIGH (ref 70–99)
Potassium: 3.5 mmol/L (ref 3.5–5.1)
Sodium: 135 mmol/L (ref 135–145)
Total Bilirubin: 0.7 mg/dL (ref 0.3–1.2)
Total Protein: 6.5 g/dL (ref 6.5–8.1)

## 2022-07-25 LAB — ANTITHROMBIN III: AntiThromb III Func: 119 % (ref 75–120)

## 2022-07-25 LAB — MAGNESIUM: Magnesium: 2.5 mg/dL — ABNORMAL HIGH (ref 1.7–2.4)

## 2022-07-25 NOTE — Progress Notes (Signed)
No treatment today per I.Thayil-PA.

## 2022-07-26 ENCOUNTER — Telehealth: Payer: Self-pay | Admitting: Internal Medicine

## 2022-07-26 DIAGNOSIS — Z992 Dependence on renal dialysis: Secondary | ICD-10-CM | POA: Diagnosis not present

## 2022-07-26 DIAGNOSIS — N186 End stage renal disease: Secondary | ICD-10-CM | POA: Diagnosis not present

## 2022-07-26 LAB — BETA-2-GLYCOPROTEIN I ABS, IGG/M/A
Beta-2 Glyco I IgG: <9 GPI IgG units (ref 0–20)
Beta-2-Glycoprotein I IgA: <9 GPI IgA units (ref 0–25)
Beta-2-Glycoprotein I IgM: <9 GPI IgM units (ref 0–32)

## 2022-07-26 LAB — HOMOCYSTEINE: Homocysteine: 29.7 umol/L — ABNORMAL HIGH (ref 0.0–17.2)

## 2022-07-26 LAB — PROTEIN C, TOTAL: Protein C, Total: 175 % — ABNORMAL HIGH (ref 60–150)

## 2022-07-26 NOTE — Telephone Encounter (Signed)
Verbal orders given  

## 2022-07-26 NOTE — Telephone Encounter (Signed)
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

## 2022-07-27 ENCOUNTER — Encounter: Payer: Self-pay | Admitting: Hematology

## 2022-07-27 LAB — PROTEIN S ACTIVITY: Protein S Activity: 86 % (ref 63–140)

## 2022-07-27 LAB — CARDIOLIPIN ANTIBODIES, IGG, IGM, IGA
Anticardiolipin IgA: <9 APL U/mL (ref 0–11)
Anticardiolipin IgG: <9 GPL U/mL (ref 0–14)
Anticardiolipin IgM: <9 MPL U/mL (ref 0–12)

## 2022-07-27 LAB — PROTEIN S, TOTAL: Protein S Ag, Total: 124 % (ref 60–150)

## 2022-07-27 LAB — PROTEIN C ACTIVITY: Protein C Activity: >199 % — ABNORMAL HIGH (ref 73–180)

## 2022-07-27 NOTE — Progress Notes (Signed)
Ernest Barnett, Ernest Barnett   CLINIC:  Medical Oncology/Hematology  PCP:  Lindell Spar, MD [address] / [place] [address] [phone]   REASON FOR VISIT:  Follow-up for multiple myeloma  PRIOR THERAPY: CyBorD  NGS Results: not done  CURRENT THERAPY: Daratumumab SQ + Bortezomib + Dexamethasone (DaraVd) q21d / Daratumumab SQ q28d   BRIEF ONCOLOGIC HISTORY:  Oncology History  Multiple myeloma without remission ([place])  03/06/2021 Initial Diagnosis   Multiple myeloma ([place])   03/06/2021 Cancer Staging   Staging form: Plasma Cell Myeloma and Plasma Cell Disorders, AJCC 8th Edition - Clinical stage from 03/06/2021: RISS Stage II (Beta-2-microglobulin (mg/L): 5.5, Albumin (g/dL): 3.5, ISS: Stage III, High-risk cytogenetics: Absent, LDH: Normal) - Signed by Derek Jack, MD on 03/06/2021 Stage prefix: Initial diagnosis Beta 2 microglobulin range (mg/L): Greater than or equal to 5.5 Albumin range (g/dL): Greater than or equal to 3.5 Cytogenetics: No abnormalities Pretreatment monoclonal protein level in serum (M spike) (g/dL): 2 Pretreatment serum free kappa light chain level (g/L): 794 Pretreatment serum free lambda light chain level (g/L): 15.9   03/14/2021 - 07/04/2021 Chemotherapy   Patient is on Treatment Plan : MULTIPLE MYELOMA CyBorD - Weekly Bortezomib     08/30/2021 -  Chemotherapy   Patient is on Treatment Plan : MYELOMA RELAPSED / REFRACTORY Daratumumab SQ + Bortezomib + Dexamethasone (DaraVd) q21d / Daratumumab SQ q28d        CANCER STAGING:  Cancer Staging  Multiple myeloma without remission (HCC) Staging form: Plasma Cell Myeloma and Plasma Cell Disorders, AJCC 8th Edition - Clinical stage from 03/06/2021: RISS Stage II (Beta-2-microglobulin (mg/L): 5.5, Albumin (g/dL): 3.5, ISS: Stage III, High-risk cytogenetics: Absent, LDH: Normal) - Signed by Derek Jack, MD on 03/06/2021   INTERVAL HISTORY:  Ernest Barnett, a 66 y.o. male, seen for follow-up of multiple myeloma.  Ernest Barnett  was last seen by Dr. Delton Coombes on 07/04/2022.   In the interim, Ernest Barnett was hospitalized form 07/07/2022 then transferred to [facility] on 07/10/2022 for sudden vision loss to the right eye. Ernest Barnett reports that Ernest Barnett began to have pressure around the right eye with some loss of vision. Over the course of the day, his symptoms worsens and his right eye became red, painful and bulging. Treated empirically with IV vanco and cefepime then ceftriaxone  followed by IV acyclovir with no improvement. Cultures with <1 + Staph epi (felt to be skin contaminant) and HSV/VZV negative. Ernest Barnett was started on prednisone taper on 07/18/2022. Temporal artery ultrasound for GCA was negative. [facility] ophthalmology suspects underlying inflammatory or ischemic etiology given abnormal inflammatory labs.   Ernest Barnett reports that it has been a struggle loosing his vision of his right eye since his vision of his left eye is decreased at baseline. Ernest Barnett is adjusting and does require assistance to do his ADLs. Ernest Barnett is eating well and denies nausea or vomiting. His bowel habits are unchanged. Ernest Barnett denies easy bruising or signs of bleeding. Ernest Barnett has chronic shortness of breath with exertion but none at rest. Ernest Barnett denies fevers, chills, sweats, chest pain or cough. Ernest Barnett has no other complaints.  REVIEW OF SYSTEMS:  Review of Systems  Constitutional:  Negative for appetite change, fatigue and unexpected weight change.  Eyes:  Positive for eye problems.  Respiratory:  Positive for shortness of breath. Negative for cough.   Cardiovascular:  Negative for chest pain and leg swelling.  Gastrointestinal:  Negative for abdominal pain,  constipation, diarrhea, nausea and vomiting.  Skin:  Negative for rash.  Neurological:  Negative for numbness.  All other systems reviewed and are negative.   PAST MEDICAL/SURGICAL HISTORY:  Past Medical History:  Diagnosis Date   Chronic kidney disease    COPD  (chronic obstructive pulmonary disease) (HCC)    CVA (cerebral vascular accident) ([place]) 2013   Cystoid macular edema of left eye 03/07/2021   GERD (gastroesophageal reflux disease)    HOH (hard of hearing)    Hypercholesteremia    Hypertension    Left epiretinal membrane 03/07/2021   Pseudophakia 03/07/2021   Stroke ([place]) 03/24/2012   left sided weakness   Vitamin D deficiency    Past Surgical History:  Procedure Laterality Date   A/V FISTULAGRAM Right 01/29/2022   Procedure: A/V Fistulagram;  Surgeon: Serafina Mitchell, MD;  Location: [place] [facility];  Service: Cardiovascular;  Laterality: Right;   AV FISTULA PLACEMENT Right 02/08/2021   Procedure: RIGHT ARM ARTERIOVENOUS (AV) FISTULA CREATION;  Surgeon: [name], MD;  Location: [facility];  Service: Vascular;  Laterality: Right;   BIOPSY  11/12/2018   Procedure: BIOPSY;  Surgeon: Rogene Houston, MD;  Location: [facility];  Service: Endoscopy;;  colon   BIOPSY  05/10/2020   Procedure: BIOPSY;  Surgeon: Rogene Houston, MD;  [facility];  Service: Endoscopy;;  esophagus   CATARACT EXTRACTION     right eye   CATARACT EXTRACTION W/PHACO  10/08/2012   Procedure: CATARACT EXTRACTION PHACO AND INTRAOCULAR LENS PLACEMENT ([place]);  Surgeon: Tonny Branch, MD;  Location: [facility];  Service: Ophthalmology;  Laterality: Left;  CDE:6.64   CHOLECYSTECTOMY     [place]   COLONOSCOPY N/A 11/12/2018   Procedure: COLONOSCOPY;  Surgeon: Rogene Houston, MD;  [facility];  Service: Endoscopy;  Laterality: N/A;  1030   ELBOW FRACTURE SURGERY     left   ESOPHAGEAL DILATION N/A 11/12/2018   Procedure: ESOPHAGEAL DILATION;  Surgeon: Rogene Houston, MD;  [facility];  Service: Endoscopy;  Laterality: N/A;   ESOPHAGEAL DILATION N/A 05/10/2020   Procedure: ESOPHAGEAL DILATION;  Surgeon: Rogene Houston, MD;  [facility];  Service: Endoscopy;  Laterality: N/A;   ESOPHAGOGASTRODUODENOSCOPY N/A 11/12/2018   Procedure:  ESOPHAGOGASTRODUODENOSCOPY (EGD);  Surgeon: Rogene Houston, MD;  [facility];  Service: Endoscopy;  Laterality: N/A;   ESOPHAGOGASTRODUODENOSCOPY N/A 05/10/2020   Procedure: ESOPHAGOGASTRODUODENOSCOPY (EGD);  Surgeon: Rogene Houston, MD;  [facility];  Service: Endoscopy;  Laterality: N/A;  210   HERNIA REPAIR     right inguinal   HYDROCELE EXCISION / REPAIR     IR FLUORO GUIDE CV LINE LEFT  08/17/2021   IR US GUIDE VASC ACCESS LEFT  08/17/2021   LIGATION OF COMPETING BRANCHES OF ARTERIOVENOUS FISTULA Right 09/11/2021   Procedure: LIGATION OF COMPETING BRANCHES OF RIGHT ARM ARTERIOVENOUS FISTULA;  Surgeon: [name], MD;  Location: [facility];  Service: Vascular;  Laterality: Right;   PERIPHERAL VASCULAR BALLOON ANGIOPLASTY Right 01/29/2022   Procedure: PERIPHERAL VASCULAR BALLOON ANGIOPLASTY;  Surgeon: Serafina Mitchell, MD;  Location: [place] [facility];  Service: Cardiovascular;  Laterality: Right;  arm fistula   POLYPECTOMY  11/12/2018   Procedure: POLYPECTOMY;  Surgeon: Rogene Houston, MD;  [facility];  Service: Endoscopy;;  colon    RETINAL DETACHMENT SURGERY Left 2019   SPLENECTOMY, TOTAL     TONSILLECTOMY  VOCAL CORD INJECTION     removal of polyp-2005    SOCIAL HISTORY:  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  Tobacco Use   Smoking status: Former    Packs/day: 3.00    Years: 30.00    Total pack years: 90.00    Types: Cigarettes   Smokeless tobacco: Current   Tobacco comments:    Currently vape  Vaping Use   Vaping Use: Every day   Substances: Nicotine  Substance and Sexual Activity   Alcohol use: Not Currently    Alcohol/week: 1.0 - 2.0 standard drink of alcohol     Types: 1 - 2 Cans of beer per week    Comment: Occasionally   Drug use: No   Sexual activity: Not Currently    Birth control/protection: Abstinence, None  Other Topics Concern   Not on file  Social History Narrative   2 children, both nearby   Social Determinants of Health   Financial Resource Strain: Low Risk  (05/30/2022)   Overall Financial Resource Strain (CARDIA)    Difficulty of Paying Living Expenses: Not hard at all  Food Insecurity: No Food Insecurity (05/30/2022)   Hunger Vital Sign    Worried About Running Out of Food in the Last Year: Never true    Ran Out of Food in the Last Year: Never true  Transportation Needs: No Transportation Needs (05/30/2022)   PRAPARE - Hydrologist (Medical): No    Lack of Transportation (Non-Medical): No  Physical Activity: Unknown (05/30/2022)   Exercise Vital Sign    Days of Exercise per Week: Not on file    Minutes of Exercise per Session: 0 min  Stress: No Stress Concern Present (05/30/2022)   [place]    Feeling of Stress : Not at all  Social Connections: Socially Isolated (05/30/2022)   Social Connection and Isolation Panel [NHANES]    Frequency of Communication with Friends and Family: More than three times a week    Frequency of Social Gatherings with Friends and Family: More than three times a week    Attends Religious Services: Never    Marine scientist or Organizations: No    Attends Archivist Meetings: Never    Marital Status: Divorced  Human resources officer Violence: Not At Risk (05/30/2022)   Humiliation, Afraid, Rape, and Kick questionnaire    Fear of Current or Ex-Partner: No    Emotionally Abused: No    Physically Abused: No    Sexually Abused: No    FAMILY HISTORY:  No family history on file.  CURRENT MEDICATIONS:  Current Outpatient Medications  Medication Sig Dispense Refill   acyclovir (ZOVIRAX) 200 MG  capsule Take 200 mg by mouth 2 (two) times daily.     albuterol (VENTOLIN HFA) 108 (90 Base) MCG/ACT inhaler Inhale 2 puffs into the lungs every 6 (six) hours as needed for wheezing or shortness of breath. 8 g 5   aspirin EC 81 MG tablet Take 1 tablet (81 mg total) by mouth daily. 30 tablet 11   atropine 1 % ophthalmic solution Place 1 drop into the right eye  4 (four) times daily. 2 mL 12   bortezomib IV (VELCADE) 3.5 MG injection Inject 1.5 mg/m2 into the vein once a week.     brinzolamide (AZOPT) 1 % ophthalmic suspension Place 1 drop into the right eye 3 (three) times daily.     budesonide-formoterol (SYMBICORT) 80-4.5 MCG/ACT inhaler Inhale 2 puffs into the lungs 2 (two) times daily as needed (shortness of breath). 10.2 each 5   ceFEPIme 1 g in sodium chloride 0.9 % 100 mL Inject 1 g into the vein daily.     dexamethasone (DECADRON) 4 MG tablet Take 2.5 tablets (10 mg total) by mouth once a week. Take at home along with Tylenol and Benadryl weekly on the day of daratumumab injection. 30 tablet 2   docusate sodium (COLACE) 100 MG capsule Take 100 mg by mouth daily.     gabapentin (NEURONTIN) 300 MG capsule Take 1 capsule by mouth twice daily 60 capsule 6   Gentamicin Sulfate (GENTAMICIN FORTIFIED) 7 ml SOLN Place 0.05 mLs (1 drop total) into the right eye every hour.     Gentamicin Sulfate (GENTAMICIN FORTIFIED) 7 ml SOLN Place 0.05 mLs (1 drop total) into the right eye every 4 (four) hours.     HYDROcodone-acetaminophen (NORCO/VICODIN) 5-325 MG tablet Take by mouth.     midodrine (PROAMATINE) 10 MG tablet Take by mouth.     moxifloxacin (VIGAMOX) 0.5 % ophthalmic solution Apply to eye.     moxifloxacin (VIGAMOX) 0.5 % ophthalmic solution Administer 1 drop to the right eye four (4) times a day.     Multiple Vitamins-Minerals (MULTIVITAMIN MEN 50+ PO) Take 1 tablet by mouth every morning.     multivitamin (RENA-VIT) TABS tablet Take 1 tablet by mouth daily.     Omega-3 Fatty Acids (FISH OIL)  1000 MG CAPS Take 1,000 mg by mouth in the morning, at noon, and at bedtime.      oxyCODONE (OXY IR/ROXICODONE) 5 MG immediate release tablet Take 1 tablet (5 mg total) by mouth 2 (two) times daily as needed for severe pain. 30 tablet 0   pantoprazole (PROTONIX) 40 MG tablet Take 1 tablet (40 mg total) by mouth daily. 90 tablet 3   polyethylene glycol (MIRALAX / GLYCOLAX) packet Take 17 g by mouth daily. Patient states that Ernest Barnett takes as needed. 30 each 5   polyethylene glycol powder (GLYCOLAX/MIRALAX) 17 GM/SCOOP powder Take by mouth.     pravastatin (PRAVACHOL) 80 MG tablet Take 1 tablet (80 mg total) by mouth daily. 90 tablet 3   predniSONE (DELTASONE) 20 MG tablet Take by mouth.     predniSONE (DELTASONE) 20 MG tablet Take by mouth.     ranolazine (RANEXA) 500 MG 12 hr tablet Take 500 mg by mouth daily.     senna (SENOKOT) 8.6 MG tablet Take 2 tablets by mouth at bedtime.     sildenafil (REVATIO) 20 MG tablet Take 1 tablet (20 mg total) by mouth 5 (five) times daily as needed. 30 tablet 2   tamsulosin (FLOMAX) 0.4 MG CAPS capsule Take 1 capsule by mouth once daily 30 capsule 3   tiZANidine (ZANAFLEX) 2 MG tablet TAKE 1 TABLET BY MOUTH EVERY 6 HOURS AS NEEDED FOR MUSCLE SPASM 30 tablet 0   torsemide (DEMADEX) 100 MG tablet Take 100 mg by mouth 2 (two) times daily.     vancomycin (VANCOCIN) 25 mg/mL SOLN Apply 1 drop to eye every hour.     No current facility-administered medications for this visit.  Facility-Administered Medications Ordered in Other Visits  Medication Dose Route Frequency Provider Last Rate Last Admin   palonosetron (ALOXI) 0.25 MG/5ML injection             ALLERGIES:  No Known Allergies  PHYSICAL EXAM:  Performance status (ECOG): 1 - Symptomatic but completely ambulatory  Vitals:   07/25/22 0920  BP: 101/73  Pulse: (!) 110  Resp: 18  Temp: 98.2 F (36.8 C)  SpO2: 95%   Wt Readings from Last 3 Encounters:  07/09/22 254 lb (115.2 kg)  07/07/22 254 lb (115.2  kg)  07/04/22 254 lb 13.6 oz (115.6 kg)   Physical Exam Vitals reviewed.  Constitutional:      Appearance: Normal appearance.  Eyes:     General:        Right eye: No discharge (clear drainage, edema, mild erythema).  Cardiovascular:     Rate and Rhythm: Normal rate and regular rhythm.     Pulses: Normal pulses.     Heart sounds: Normal heart sounds.  Pulmonary:     Effort: Pulmonary effort is normal.     Breath sounds: Normal breath sounds.  Neurological:     General: No focal deficit present.     Mental Status: Ernest Barnett is alert and oriented to person, place, and time.  Psychiatric:        Mood and Affect: Mood normal.        Behavior: Behavior normal.     LABORATORY DATA:  I have reviewed the labs as listed.     Latest Ref Rng & Units 07/25/2022    8:38 AM 07/10/2022    4:20 AM 07/09/2022    5:20 PM  CBC  WBC 4.0 - 10.5 K/uL 5.5  4.6  5.6   Hemoglobin 13.0 - 17.0 g/dL 12.3  11.8  12.5   Hematocrit 39.0 - 52.0 % 36.4  35.3  36.4   Platelets 150 - 400 K/uL 148  91  93       Latest Ref Rng & Units 07/25/2022    8:38 AM 07/10/2022    4:20 AM 07/09/2022    9:20 PM  CMP  Glucose 70 - 99 mg/dL 132  118  127   BUN 8 - 23 mg/dL 48  32  31   Creatinine 0.61 - 1.24 mg/dL 3.67  3.50  3.61   Sodium 135 - 145 mmol/L 135  137  140   Potassium 3.5 - 5.1 mmol/L 3.5  3.4  3.5   Chloride 98 - 111 mmol/L 101  109  109   CO2 22 - 32 mmol/L _0 Calcium 8.9 - 10.3 mg/dL 8.6  9.3  9.6   Total Protein 6.5 - 8.1 g/dL 6.5   6.5   Total Bilirubin 0.3 - 1.2 mg/dL 0.7   0.6   Alkaline Phos 38 - 126 U/L 53   54   AST 15 - 41 U/L 28   14   ALT 0 - 44 U/L 54   21     DIAGNOSTIC IMAGING:  I have independently reviewed the scans and discussed with the patient. CT Orbits W Contrast  Result Date: 07/09/2022 CLINICAL DATA:  Orbital cellulitis suspected EXAM: CT ORBITS WITH CONTRAST TECHNIQUE: Multidetector CT images was performed according to the standard protocol following intravenous contrast  administration. RADIATION DOSE REDUCTION: This exam was performed according to the departmental dose-optimization program which includes automated exposure control, adjustment of the mA and/or kV according to  patient size and/or use of iterative reconstruction technique. CONTRAST:  39mL OMNIPAQUE IOHEXOL 350 MG/ML SOLN COMPARISON:  None Available. FINDINGS: Orbits: No orbital mass. Mild hyperenhancement and thickening of the sclera of the right globe (series 31, image 27). Minimal fat stranding posterior to the right globe (series 33, image 29). No definite hyperenhancement of the right optic nerve sheath complex. Normal appearance of the extra-ocular muscles. Right proptosis. Normal appearance of the left orbit. No evidence of postseptal collection or subperiosteal abscess status post bilateral lens replacements. Visible paranasal sinuses: Clear. Soft tissues: Thickening and enhancement of the right eyelid and periorbital soft tissues. Osseous: No fracture or aggressive lesion. Limited intracranial: Please see same day CTA head neck. IMPRESSION: Right periorbital soft tissue enhancement and thickening, consistent with preseptal cellulitis, with hyperenhancement and thickening of the right sclera and minimal fat stranding posterior to the right globe, concerning for early orbital cellulitis. Electronically Signed   By: Merilyn Baba M.D.   On: 07/09/2022 23:10   CT VENOGRAM HEAD  Result Date: 07/09/2022 CLINICAL DATA:  Stroke suspected EXAM: CT ANGIOGRAPHY HEAD AND NECK CT VENOGRAM HEAD TECHNIQUE: Multidetector CT imaging of the head and neck was performed using the standard protocol during bolus administration of intravenous contrast. Multiplanar CT image reconstructions and MIPs were obtained to evaluate the vascular anatomy. Carotid stenosis measurements (when applicable) are obtained utilizing NASCET criteria, using the distal internal carotid diameter as the denominator. Venographic phase images of the brain  were obtained following the administration of intravenous contrast. Multiplanar reformats and maximum intensity projections were generated. RADIATION DOSE REDUCTION: This exam was performed according to the departmental dose-optimization program which includes automated exposure control, adjustment of the mA and/or kV according to patient size and/or use of iterative reconstruction technique. CONTRAST:  60mL OMNIPAQUE IOHEXOL 350 MG/ML SOLN COMPARISON:  None Available. FINDINGS: CT HEAD FINDINGS Brain: No evidence of acute infarct, hemorrhage, mass, mass effect, or midline shift. No hydrocephalus or extra-axial fluid collection. Encephalomalacia in the anterior right frontal lobe. Periventricular white matter changes, likely the sequela of chronic small vessel ischemic disease. Remote lacunar infarcts in the pons Vascular: No hyperdense vessel. Skull: Normal. Negative for fracture or focal lesion. Sinuses/Orbits: No acute finding in the sinuses. Please see same-day CT orbits for orbital findings. Other: The mastoids are not pneumatized. CTA NECK FINDINGS Aortic arch: Two-vessel arch with a common origin of the brachiocephalic and left common carotid arteries. Imaged portion shows no evidence of aneurysm or dissection. No significant stenosis of the major arch vessel origins. Mild aortic atherosclerosis. Right carotid system: No evidence of dissection, occlusion, or hemodynamically significant stenosis (greater than 50%). Atherosclerotic disease at the bifurcation and in the proximal ICA is not hemodynamically significant. Left carotid system: No evidence of dissection, occlusion, or hemodynamically significant stenosis (greater than 50%). Atherosclerotic disease at the bifurcation and in the proximal ICA is not hemodynamically significant. Significant tortuosity of the left ICA. Vertebral arteries: Evaluation of the proximal vertebral arteries bilaterally is somewhat limited by beam hardening artifact from the  contrast bolus. Within this limitation, no evidence of dissection, occlusion, or hemodynamically significant stenosis (greater than 50%). Skeleton: No acute osseous abnormality. Degenerative changes in the cervical spine. Edentulous. Other neck: Medialization of the left vocal fold with abduction of the right vocal fold and enlargement of the right piriform sinus. Upper chest: No acute finding. Review of the MIP images confirms the above findings CTA HEAD FINDINGS Anterior circulation: Both internal carotid arteries are patent to the termini, without significant  stenosis. Fusiform dilatation of the right cavernous carotid which measures up to 8 mm (series 18, image 118 and series 20, image 79), compared to 6 mm more proximally (series 20, image 82). A1 segments patent. Normal anterior communicating artery. Anterior cerebral arteries are irregular but patent to their distal aspects. Long segment moderate narrowing in the proximal left M1 (series 18, image 100) MCA branches perfused and symmetric. Posterior circulation: Vertebral arteries patent to the vertebrobasilar junction without stenosis. Posterior inferior cerebellar arteries patent proximally. Basilar patent to its distal aspect. Superior cerebellar arteries patent proximally. Patent but diminutive P1 segments. Patent bilateral posterior communicating arteries with near fetal origin of the bilateral PCAs. Multifocal mild narrowing and irregularity are noted in the left posterior communicating artery and bilateral PCAs. PCAs perfused to their distal aspects. Anatomic variants: Near fetal origin of the bilateral PCAs. Review of the MIP images confirms the above findings CTV FINDINGS Superior sagittal sinus: Normal. Straight sinus: Normal. Inferior sagittal sinus, vein of Galen and internal cerebral veins: Normal. Transverse sinuses: Normal. Sigmoid sinuses: Normal. Visualized jugular veins: Normal IMPRESSION: 1. No intracranial large vessel occlusion. Moderate  stenosis in the left M1 and mild multifocal narrowing in the bilateral PCAs. 2. Fusiform dilatation of the right cavernous carotid, measuring up to 8 mm 3.  No hemodynamically significant stenosis in the neck. 4.  No acute intracranial process. 5. Abduction of the right vocal fold with medial ization of the left vocal fold, concerning for right vocal cord paralysis. 6. No evidence of intracranial venous sinus thrombosis or stenosis. 7. For orbital findings, please see same-day CT orbits. Electronically Signed   By: Merilyn Baba M.D.   On: 07/09/2022 22:59   CT ANGIO HEAD NECK W WO CM  Result Date: 07/09/2022 CLINICAL DATA:  Stroke suspected EXAM: CT ANGIOGRAPHY HEAD AND NECK CT VENOGRAM HEAD TECHNIQUE: Multidetector CT imaging of the head and neck was performed using the standard protocol during bolus administration of intravenous contrast. Multiplanar CT image reconstructions and MIPs were obtained to evaluate the vascular anatomy. Carotid stenosis measurements (when applicable) are obtained utilizing NASCET criteria, using the distal internal carotid diameter as the denominator. Venographic phase images of the brain were obtained following the administration of intravenous contrast. Multiplanar reformats and maximum intensity projections were generated. RADIATION DOSE REDUCTION: This exam was performed according to the departmental dose-optimization program which includes automated exposure control, adjustment of the mA and/or kV according to patient size and/or use of iterative reconstruction technique. CONTRAST:  24m OMNIPAQUE IOHEXOL 350 MG/ML SOLN COMPARISON:  None Available. FINDINGS: CT HEAD FINDINGS Brain: No evidence of acute infarct, hemorrhage, mass, mass effect, or midline shift. No hydrocephalus or extra-axial fluid collection. Encephalomalacia in the anterior right frontal lobe. Periventricular white matter changes, likely the sequela of chronic small vessel ischemic disease. Remote lacunar  infarcts in the pons Vascular: No hyperdense vessel. Skull: Normal. Negative for fracture or focal lesion. Sinuses/Orbits: No acute finding in the sinuses. Please see same-day CT orbits for orbital findings. Other: The mastoids are not pneumatized. CTA NECK FINDINGS Aortic arch: Two-vessel arch with a common origin of the brachiocephalic and left common carotid arteries. Imaged portion shows no evidence of aneurysm or dissection. No significant stenosis of the major arch vessel origins. Mild aortic atherosclerosis. Right carotid system: No evidence of dissection, occlusion, or hemodynamically significant stenosis (greater than 50%). Atherosclerotic disease at the bifurcation and in the proximal ICA is not hemodynamically significant. Left carotid system: No evidence of dissection, occlusion, or hemodynamically significant  stenosis (greater than 50%). Atherosclerotic disease at the bifurcation and in the proximal ICA is not hemodynamically significant. Significant tortuosity of the left ICA. Vertebral arteries: Evaluation of the proximal vertebral arteries bilaterally is somewhat limited by beam hardening artifact from the contrast bolus. Within this limitation, no evidence of dissection, occlusion, or hemodynamically significant stenosis (greater than 50%). Skeleton: No acute osseous abnormality. Degenerative changes in the cervical spine. Edentulous. Other neck: Medialization of the left vocal fold with abduction of the right vocal fold and enlargement of the right piriform sinus. Upper chest: No acute finding. Review of the MIP images confirms the above findings CTA HEAD FINDINGS Anterior circulation: Both internal carotid arteries are patent to the termini, without significant stenosis. Fusiform dilatation of the right cavernous carotid which measures up to 8 mm (series 18, image 118 and series 20, image 79), compared to 6 mm more proximally (series 20, image 82). A1 segments patent. Normal anterior communicating  artery. Anterior cerebral arteries are irregular but patent to their distal aspects. Long segment moderate narrowing in the proximal left M1 (series 18, image 100) MCA branches perfused and symmetric. Posterior circulation: Vertebral arteries patent to the vertebrobasilar junction without stenosis. Posterior inferior cerebellar arteries patent proximally. Basilar patent to its distal aspect. Superior cerebellar arteries patent proximally. Patent but diminutive P1 segments. Patent bilateral posterior communicating arteries with near fetal origin of the bilateral PCAs. Multifocal mild narrowing and irregularity are noted in the left posterior communicating artery and bilateral PCAs. PCAs perfused to their distal aspects. Anatomic variants: Near fetal origin of the bilateral PCAs. Review of the MIP images confirms the above findings CTV FINDINGS Superior sagittal sinus: Normal. Straight sinus: Normal. Inferior sagittal sinus, vein of Galen and internal cerebral veins: Normal. Transverse sinuses: Normal. Sigmoid sinuses: Normal. Visualized jugular veins: Normal IMPRESSION: 1. No intracranial large vessel occlusion. Moderate stenosis in the left M1 and mild multifocal narrowing in the bilateral PCAs. 2. Fusiform dilatation of the right cavernous carotid, measuring up to 8 mm 3.  No hemodynamically significant stenosis in the neck. 4.  No acute intracranial process. 5. Abduction of the right vocal fold with medial ization of the left vocal fold, concerning for right vocal cord paralysis. 6. No evidence of intracranial venous sinus thrombosis or stenosis. 7. For orbital findings, please see same-day CT orbits. Electronically Signed   By: Merilyn Baba M.D.   On: 07/09/2022 22:59   B-Scan Ultrasound - OS - Left Eye  Result Date: 07/09/2022 Quality was good. Notes Optically empty vitreous cavity.  No retinal tears or detachment.  No obvious infiltrates or debris    ASSESSMENT:  1.  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  Basis for CKD is hypertension associated arteriosclerosis with arterionephrosclerosis. - Bone marrow biopsy on 02/21/2021 with slightly hypercellular bone marrow with trilineage hematopoiesis.  Plasma cells increased in number representing 11% of all cells with kappa light chain restriction. - Myeloma FISH panel with no evidence of T p53.  Plasma cell enrichment yielded limited cellularity, therefore only tested for T p53 probes at. - Chromosome analysis 45, X,- Y (8)/46, XY (12) - Labs on 02/01/2021-M spike 2 g, kappa light chain 794, free light chain ratio 49.97.  Beta-2 microglobulin 5.5, albumin 3.5.  LDH normal. - PET scan on 03/08/2021 with no evidence of active myeloma.  No soft tissue plasmacytoma. - 24-hour urine total protein 2.2 g on  03/13/2021.  Immunofixation positive for IgG kappa. - 4 cycles of CyBorD from 03/14/2021 through 07/04/2021 held due to fluid retention.  Myeloma labs on 08/08/2021 showed progression. - Daratumumab, Velcade and dexamethasone started on 08/30/2021.   2.  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  No exposure to chemicals.  Smoked 2 packs/day for 32 years and quit smoking cigarettes and vaping now. -Mother had small cell lung cancer.   3.  CKD: -Ernest Barnett has stage V CKD thought to be secondary to hypertension. -Renal ultrasound on 12/19/2020 was negative for obstructive uropathy and findings were consistent with chronic medical renal disease.  Bilateral kidney cysts were seen. -Ernest Barnett has 2.7 g of proteinuria on 24-hour urine. -Kidney biopsy was done on 01/25/2021.   PLAN:  1.  Stage II, standard risk IgG kappa multiple myeloma: - Reviewed labs from today that requires no intervention. WBC 5.5, Hgb 12.3, Plt 148K. Stable creatinine at 3.67. Mild elevation of ALT 54.  --Delay treatment today as patient undergoes  hypercoagulable testing.  --RTC in 1 week with follow and labs to assess if Ernest Barnett should resume treatment.    2.  Smoking history: - Last CT chest on 02/15/2021 was lung RADS 1.   3.  ID prophylaxis: - Continue aspirin 81 mg daily.  Continue acyclovir 200 mg twice daily.   4.  Myeloma bone disease: - Holding denosumab since dialysis started.  5.  Lower extremity swelling: - Continue furosemide twice daily.  6.  ESRD on HD: - Peritoneal dialysis started on 05/15/2022 ongoing well.  7. Right eye vision loss: --[facility] opthalmology is suspecting inflammatory process versus thrombotic event.  --Currently on prednisone taper.  --Ordered hypercoagulable workup today to further evaluate.   Orders placed this encounter:  Orders Placed This Encounter  Procedures   Antithrombin III   Protein C activity   Protein C, total   Protein S activity   Protein S, total   Lupus anticoagulant panel   Beta-2-glycoprotein i abs, IgG/M/A   Homocysteine, serum   Factor 5 leiden   Prothrombin gene mutation   Cardiolipin antibodies, IgG, IgM, IgA    I have spent a total of 30 minutes minutes of face-to-face and non-face-to-face time, preparing to see the patient, performing a medically appropriate examination, counseling and educating the patient, ordering tests/procedures, documenting clinical information in the electronic health record, and care coordination.   Dede Query PA-C Dept of Hematology and [place]

## 2022-07-28 DIAGNOSIS — N186 End stage renal disease: Secondary | ICD-10-CM | POA: Diagnosis not present

## 2022-07-28 DIAGNOSIS — Z992 Dependence on renal dialysis: Secondary | ICD-10-CM | POA: Diagnosis not present

## 2022-07-28 LAB — LUPUS ANTICOAGULANT PANEL
DRVVT: 44.6 s (ref 0.0–47.0)
PTT Lupus Anticoagulant: 26.6 s (ref 0.0–43.5)

## 2022-07-29 ENCOUNTER — Telehealth: Payer: Self-pay | Admitting: Internal Medicine

## 2022-07-29 DIAGNOSIS — N186 End stage renal disease: Secondary | ICD-10-CM | POA: Diagnosis not present

## 2022-07-29 DIAGNOSIS — Z992 Dependence on renal dialysis: Secondary | ICD-10-CM | POA: Diagnosis not present

## 2022-07-29 NOTE — Telephone Encounter (Signed)
Please schedule as follow up too late to make TOC call

## 2022-07-29 NOTE — Telephone Encounter (Signed)
[facility] admitted to [facility] 9/5-9[facility]6  Patient transferred to [facility] 9/6-9/15  D/C from [facility] 9/15  TOC appt scheduled 9/29   TOC call needed

## 2022-07-30 ENCOUNTER — Telehealth: Payer: Self-pay

## 2022-07-30 DIAGNOSIS — N186 End stage renal disease: Secondary | ICD-10-CM | POA: Diagnosis not present

## 2022-07-30 DIAGNOSIS — Z992 Dependence on renal dialysis: Secondary | ICD-10-CM | POA: Diagnosis not present

## 2022-07-30 LAB — FACTOR 5 LEIDEN

## 2022-07-30 NOTE — Telephone Encounter (Signed)
Erline Levine called from [facility] home health patient doing well no additional therapy needed If need to speak to [facility] # [phone]

## 2022-07-31 DIAGNOSIS — Z992 Dependence on renal dialysis: Secondary | ICD-10-CM | POA: Diagnosis not present

## 2022-07-31 DIAGNOSIS — N186 End stage renal disease: Secondary | ICD-10-CM | POA: Diagnosis not present

## 2022-07-31 LAB — PROTHROMBIN GENE MUTATION

## 2022-08-01 ENCOUNTER — Inpatient Hospital Stay: Payer: Medicare HMO

## 2022-08-01 ENCOUNTER — Inpatient Hospital Stay (HOSPITAL_BASED_OUTPATIENT_CLINIC_OR_DEPARTMENT_OTHER): Payer: Medicare HMO | Admitting: Hematology

## 2022-08-01 DIAGNOSIS — C9000 Multiple myeloma not having achieved remission: Secondary | ICD-10-CM

## 2022-08-01 DIAGNOSIS — Z79899 Other long term (current) drug therapy: Secondary | ICD-10-CM | POA: Diagnosis not present

## 2022-08-01 DIAGNOSIS — M7989 Other specified soft tissue disorders: Secondary | ICD-10-CM | POA: Diagnosis not present

## 2022-08-01 DIAGNOSIS — I10 Essential (primary) hypertension: Secondary | ICD-10-CM | POA: Diagnosis not present

## 2022-08-01 DIAGNOSIS — H5461 Unqualified visual loss, right eye, normal vision left eye: Secondary | ICD-10-CM | POA: Diagnosis not present

## 2022-08-01 DIAGNOSIS — Z992 Dependence on renal dialysis: Secondary | ICD-10-CM | POA: Diagnosis not present

## 2022-08-01 DIAGNOSIS — Z87891 Personal history of nicotine dependence: Secondary | ICD-10-CM | POA: Diagnosis not present

## 2022-08-01 DIAGNOSIS — N186 End stage renal disease: Secondary | ICD-10-CM | POA: Diagnosis not present

## 2022-08-01 LAB — CBC WITH DIFFERENTIAL/PLATELET
Abs Immature Granulocytes: 0 10*3/uL (ref 0.00–0.07)
Basophils Absolute: 0 10*3/uL (ref 0.0–0.1)
Basophils Relative: 0 %
Eosinophils Absolute: 0 10*3/uL (ref 0.0–0.5)
Eosinophils Relative: 0 %
HCT: 32.8 % — ABNORMAL LOW (ref 39.0–52.0)
Hemoglobin: 11.2 g/dL — ABNORMAL LOW (ref 13.0–17.0)
Lymphocytes Relative: 63 %
Lymphs Abs: 2.9 10*3/uL (ref 0.7–4.0)
MCH: 35.1 pg — ABNORMAL HIGH (ref 26.0–34.0)
MCHC: 34.1 g/dL (ref 30.0–36.0)
MCV: 102.8 fL — ABNORMAL HIGH (ref 80.0–100.0)
Monocytes Absolute: 0.4 10*3/uL (ref 0.1–1.0)
Monocytes Relative: 8 %
Neutro Abs: 1.3 10*3/uL — ABNORMAL LOW (ref 1.7–7.7)
Neutrophils Relative %: 29 %
Platelets: 113 10*3/uL — ABNORMAL LOW (ref 150–400)
RBC: 3.19 MIL/uL — ABNORMAL LOW (ref 4.22–5.81)
RDW: 18.4 % — ABNORMAL HIGH (ref 11.5–15.5)
WBC: 4.6 10*3/uL (ref 4.0–10.5)
nRBC: 7.8 % — ABNORMAL HIGH (ref 0.0–0.2)

## 2022-08-01 NOTE — Progress Notes (Signed)
[place], [place] [address]   CLINIC:  Medical Oncology/Hematology  PCP:  Lindell Spar, MD [address] / [place] [address] [phone]   REASON FOR VISIT:  Follow-up for multiple myeloma  PRIOR THERAPY: CyBorD  NGS Results: not done  CURRENT THERAPY: Daratumumab SQ + Bortezomib + Dexamethasone (DaraVd) q21d / Daratumumab SQ q28d   BRIEF ONCOLOGIC HISTORY:  Oncology History  Multiple myeloma without remission ([place])  03/06/2021 Initial Diagnosis   Multiple myeloma ([place])   03/06/2021 Cancer Staging   Staging form: Plasma Cell Myeloma and Plasma Cell Disorders, AJCC 8th Edition - Clinical stage from 03/06/2021: RISS Stage II (Beta-2-microglobulin (mg/L): 5.5, Albumin (g/dL): 3.5, ISS: Stage III, High-risk cytogenetics: Absent, LDH: Normal) - Signed by Derek Jack, MD on 03/06/2021 Stage prefix: Initial diagnosis Beta 2 microglobulin range (mg/L): Greater than or equal to 5.5 Albumin range (g/dL): Greater than or equal to 3.5 Cytogenetics: No abnormalities Pretreatment monoclonal protein level in serum (M spike) (g/dL): 2 Pretreatment serum free kappa light chain level (g/L): 794 Pretreatment serum free lambda light chain level (g/L): 15.9   03/14/2021 - 07/04/2021 Chemotherapy   Patient is on Treatment Plan : MULTIPLE MYELOMA CyBorD - Weekly Bortezomib     08/30/2021 -  Chemotherapy   Patient is on Treatment Plan : MYELOMA RELAPSED / REFRACTORY Daratumumab SQ + Bortezomib + Dexamethasone (DaraVd) q21d / Daratumumab SQ q28d        CANCER STAGING:  Cancer Staging  Multiple myeloma without remission (HCC) Staging form: Plasma Cell Myeloma and Plasma Cell Disorders, AJCC 8th Edition - Clinical stage from 03/06/2021: RISS Stage II (Beta-2-microglobulin (mg/L): 5.5, Albumin (g/dL): 3.5, ISS: Stage III, High-risk cytogenetics: Absent, LDH: Normal) - Signed by Derek Jack, MD on 03/06/2021   INTERVAL HISTORY:  [name], a 66 y.o. male, is seen today for follow-up of multiple myeloma.  He was admitted to [facility] with sudden vision loss in the right eye and was discharged on 07/19/2022.  He was thought to have ischemic event and posterior scleritis and corneal ulcer of the right eye.  He does not have any vision in the right eye at this time.  Reports energy levels of 75%.  He was last seen by Dr. Stacey Drain at [facility] ophthalmology on 07/24/2022.  He was started on prednisone.   REVIEW OF SYSTEMS:  Review of Systems  Constitutional:  Negative for appetite change and unexpected weight change.  Respiratory:  Positive for cough and shortness of breath.   Neurological:  Positive for dizziness. Negative for numbness.  All other systems reviewed and are negative.   PAST MEDICAL/SURGICAL HISTORY:  Past Medical History:  Diagnosis Date   Chronic kidney disease    COPD (chronic obstructive pulmonary disease) (HCC)    CVA (cerebral vascular accident) ([place]) 2013   Cystoid macular edema of left eye 03/07/2021   GERD (gastroesophageal reflux disease)    HOH (hard of hearing)    Hypercholesteremia    Hypertension    Left epiretinal membrane 03/07/2021   Pseudophakia 03/07/2021   Stroke ([place]) 03/24/2012   left sided weakness   Vitamin D deficiency    Past Surgical History:  Procedure Laterality Date   A/V FISTULAGRAM Right 01/29/2022   Procedure: A/V Fistulagram;  Surgeon: Serafina Mitchell, MD;  Location: [place] [facility];  Service: Cardiovascular;  Laterality: Right;   AV FISTULA PLACEMENT Right 02/08/2021   Procedure: RIGHT ARM ARTERIOVENOUS (AV) FISTULA  CREATION;  Surgeon: [name], MD;  Location: [facility];  Service: Vascular;  Laterality: Right;   BIOPSY  11/12/2018   Procedure: BIOPSY;  Surgeon: Rogene Houston, MD;  Location[facility];  Service: Endoscopy;;  colon   BIOPSY  05/10/2020   Procedure: BIOPSY;  Surgeon: Rogene Houston, MD;  [facility];  Service: Endoscopy;;  esophagus    CATARACT EXTRACTION     right eye   CATARACT EXTRACTION [facility]  10/08/2012   Procedure: CATARACT EXTRACTION PHACO AND INTRAOCULAR LENS PLACEMENT ([place]);  Surgeon: Tonny Branch, MD;  Location: [facility];  Service: Ophthalmology;  Laterality: Left;  CDE:6.64   CHOLECYSTECTOMY     [place]   COLONOSCOPY N/A 11/12/2018   Procedure: COLONOSCOPY;  Surgeon: Rogene Houston, MD;  [facility];  Service: Endoscopy;  Laterality: N/A;  1030   ELBOW FRACTURE SURGERY     left   ESOPHAGEAL DILATION N/A 11/12/2018   Procedure: ESOPHAGEAL DILATION;  Surgeon: Rogene Houston, MD;  [facility];  Service: Endoscopy;  Laterality: N/A;   ESOPHAGEAL DILATION N/A 05/10/2020   Procedure: ESOPHAGEAL DILATION;  Surgeon: Rogene Houston, MD;  [facility];  Service: Endoscopy;  Laterality: N/A;   ESOPHAGOGASTRODUODENOSCOPY N/A 11/12/2018   Procedure: ESOPHAGOGASTRODUODENOSCOPY (EGD);  Surgeon: Rogene Houston, MD;  [facility];  Service: Endoscopy;  Laterality: N/A;   ESOPHAGOGASTRODUODENOSCOPY N/A 05/10/2020   Procedure: ESOPHAGOGASTRODUODENOSCOPY (EGD);  Surgeon: Rogene Houston, MD;  [facility];  Service: Endoscopy;  Laterality: N/A;  210   HERNIA REPAIR     right inguinal   HYDROCELE EXCISION / REPAIR     IR FLUORO GUIDE CV LINE LEFT  08/17/2021   IR US GUIDE VASC ACCESS LEFT  08/17/2021   LIGATION OF COMPETING BRANCHES OF ARTERIOVENOUS FISTULA Right 09/11/2021   Procedure: LIGATION OF COMPETING BRANCHES OF RIGHT ARM ARTERIOVENOUS FISTULA;  Surgeon: [name], MD;  Location: [facility];  Service: Vascular;  Laterality: Right;   PERIPHERAL VASCULAR BALLOON ANGIOPLASTY Right 01/29/2022   Procedure: PERIPHERAL VASCULAR BALLOON ANGIOPLASTY;  Surgeon: Serafina Mitchell, MD;  Location: [place] [facility];  Service: Cardiovascular;  Laterality: Right;  arm fistula   POLYPECTOMY  11/12/2018   Procedure: POLYPECTOMY;  Surgeon: Rogene Houston, MD;  [facility];   Service: Endoscopy;;  colon    RETINAL DETACHMENT SURGERY Left 2019   SPLENECTOMY, TOTAL     TONSILLECTOMY     VOCAL CORD INJECTION     removal of polyp-2005    SOCIAL HISTORY:  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  Tobacco Use   Smoking status: Former    Packs/day: 3.00    Years: 30.00    Total pack years: 90.00    Types: Cigarettes   Smokeless tobacco: Current   Tobacco comments:    Currently vape  Vaping Use   Vaping Use: Every day   Substances: Nicotine  Substance and Sexual Activity   Alcohol use: Not Currently    Alcohol/week: 1.0 - 2.0 standard drink of alcohol    Types: 1 - 2 Cans of beer per week  Comment: Occasionally   Drug use: No   Sexual activity: Not Currently    Birth control/protection: Abstinence, None  Other Topics Concern   Not on file  Social History Narrative   2 children, both nearby   Social Determinants of Health   Financial Resource Strain: Low Risk  (05/30/2022)   Overall Financial Resource Strain (CARDIA)    Difficulty of Paying Living Expenses: Not hard at all  Food Insecurity: No Food Insecurity (05/30/2022)   Hunger Vital Sign    Worried About Running Out of Food in the Last Year: Never true    Ran Out of Food in the Last Year: Never true  Transportation Needs: No Transportation Needs (05/30/2022)   PRAPARE - Hydrologist (Medical): No    Lack of Transportation (Non-Medical): No  Physical Activity: Unknown (05/30/2022)   Exercise Vital Sign    Days of Exercise per Week: Not on file    Minutes of Exercise per Session: 0 min  Stress: No Stress Concern Present (05/30/2022)   [place]    Feeling of Stress : Not at all  Social Connections: Socially Isolated (05/30/2022)   Social Connection and Isolation Panel [NHANES]    Frequency of Communication with Friends and Family: More than three times a week    Frequency of Social Gatherings with Friends and Family: More than three times a week    Attends Religious Services: Never    Marine scientist or Organizations: No    Attends Archivist Meetings: Never    Marital Status: Divorced  Human resources officer Violence: Not At Risk (05/30/2022)   Humiliation, Afraid, Rape, and Kick questionnaire    Fear of Current or Ex-Partner: No    Emotionally Abused: No    Physically Abused: No    Sexually Abused: No    FAMILY HISTORY:  No family history on file.  CURRENT MEDICATIONS:  Current Outpatient Medications  Medication Sig Dispense Refill   acyclovir (ZOVIRAX) 200 MG capsule Take 200 mg by mouth 2 (two) times daily.     albuterol (VENTOLIN HFA) 108 (90 Base) MCG/ACT inhaler Inhale 2 puffs into the lungs every 6 (six) hours as needed for wheezing or shortness of breath. 8 g 5   aspirin EC 81 MG tablet Take 1 tablet (81 mg total) by mouth daily. 30 tablet 11   atropine 1 % ophthalmic solution Place 1 drop into the right eye 4 (four) times daily. 2 mL 12   bortezomib IV (VELCADE) 3.5 MG injection Inject 1.5 mg/m2 into the vein once a week.     brinzolamide (AZOPT) 1 % ophthalmic suspension Place 1 drop into the right eye 3 (three) times daily.     budesonide-formoterol (SYMBICORT) 80-4.5 MCG/ACT inhaler Inhale 2 puffs into the lungs 2 (two) times daily as needed (shortness of breath). 10.2 each 5   ceFEPIme 1 g in sodium chloride 0.9 % 100 mL Inject 1 g into the vein daily.     dexamethasone (DECADRON) 4 MG tablet Take 2.5 tablets (10 mg total) by mouth once a week. Take at home along with Tylenol and Benadryl weekly on the day of daratumumab injection. 30 tablet 2   docusate sodium (COLACE) 100 MG capsule  Take 100 mg by mouth daily.     gabapentin (NEURONTIN) 300 MG capsule Take 1 capsule by mouth twice daily 60 capsule 6   Gentamicin Sulfate (GENTAMICIN FORTIFIED) 7 ml SOLN  Place 0.05 mLs (1 drop total) into the right eye every hour.     Gentamicin Sulfate (GENTAMICIN FORTIFIED) 7 ml SOLN Place 0.05 mLs (1 drop total) into the right eye every 4 (four) hours.     HYDROcodone-acetaminophen (NORCO/VICODIN) 5-325 MG tablet Take by mouth.     midodrine (PROAMATINE) 10 MG tablet Take by mouth.     moxifloxacin (VIGAMOX) 0.5 % ophthalmic solution Apply to eye.     moxifloxacin (VIGAMOX) 0.5 % ophthalmic solution Administer 1 drop to the right eye four (4) times a day.     Multiple Vitamins-Minerals (MULTIVITAMIN MEN 50+ PO) Take 1 tablet by mouth every morning.     multivitamin (RENA-VIT) TABS tablet Take 1 tablet by mouth daily.     Omega-3 Fatty Acids (FISH OIL) 1000 MG CAPS Take 1,000 mg by mouth in the morning, at noon, and at bedtime.      oxyCODONE (OXY IR/ROXICODONE) 5 MG immediate release tablet Take 1 tablet (5 mg total) by mouth 2 (two) times daily as needed for severe pain. 30 tablet 0   pantoprazole (PROTONIX) 40 MG tablet Take 1 tablet (40 mg total) by mouth daily. 90 tablet 3   polyethylene glycol (MIRALAX / GLYCOLAX) packet Take 17 g by mouth daily. Patient states that he takes as needed. 30 each 5   polyethylene glycol powder (GLYCOLAX/MIRALAX) 17 GM/SCOOP powder Take by mouth.     pravastatin (PRAVACHOL) 80 MG tablet Take 1 tablet (80 mg total) by mouth daily. 90 tablet 3   prednisoLONE acetate (PRED FORTE) 1 % ophthalmic suspension Administer 1 drop into the left eye four (4) times a day for 7 days, THEN 1 drop Three (3) times a day before meals for 7 days, THEN 1 drop Two (2) times a day for 7 days, THEN 1 drop daily for 7 days.     predniSONE (DELTASONE) 20 MG tablet Take by mouth.     predniSONE (DELTASONE) 20 MG tablet Take by mouth.     ranolazine (RANEXA) 500 MG 12 hr tablet Take  500 mg by mouth daily.     senna (SENOKOT) 8.6 MG tablet Take 2 tablets by mouth at bedtime.     sildenafil (REVATIO) 20 MG tablet Take 1 tablet (20 mg total) by mouth 5 (five) times daily as needed. 30 tablet 2   tamsulosin (FLOMAX) 0.4 MG CAPS capsule Take 1 capsule by mouth once daily 30 capsule 3   tiZANidine (ZANAFLEX) 2 MG tablet TAKE 1 TABLET BY MOUTH EVERY 6 HOURS AS NEEDED FOR MUSCLE SPASM 30 tablet 0   torsemide (DEMADEX) 100 MG tablet Take 100 mg by mouth 2 (two) times daily.     vancomycin (VANCOCIN) 25 mg/mL SOLN Apply 1 drop to eye every hour.     No current facility-administered medications for this visit.   Facility-Administered Medications Ordered in Other Visits  Medication Dose Route Frequency Provider Last Rate Last Admin   palonosetron (ALOXI) 0.25 MG/5ML injection             ALLERGIES:  No Known Allergies  PHYSICAL EXAM:  Performance status (ECOG): 1 - Symptomatic but completely ambulatory  There were no vitals filed for this visit.  Wt Readings from Last 3 Encounters:  07/09/22 254 lb (115.2 kg)  07/07/22 254 lb (115.2 kg)  07/04/22 254 lb 13.6 oz (115.6 kg)   Physical Exam Vitals reviewed.  Constitutional:      Appearance: Normal appearance.  Cardiovascular:     Rate and  Rhythm: Normal rate and regular rhythm.     Pulses: Normal pulses.     Heart sounds: Normal heart sounds.  Pulmonary:     Effort: Pulmonary effort is normal.     Breath sounds: Normal breath sounds.  Neurological:     General: No focal deficit present.     Mental Status: He is alert and oriented to person, place, and time.  Psychiatric:        Mood and Affect: Mood normal.        Behavior: Behavior normal.     LABORATORY DATA:  I have reviewed the labs as listed.     Latest Ref Rng & Units 08/01/2022    8:57 AM 07/25/2022    8:38 AM 07/10/2022    4:20 AM  CBC  WBC 4.0 - 10.5 K/uL 4.6  5.5  4.6   Hemoglobin 13.0 - 17.0 g/dL 11.2  12.3  11.8   Hematocrit 39.0 - 52.0 % 32.8   36.4  35.3   Platelets 150 - 400 K/uL 113  148  91       Latest Ref Rng & Units 07/25/2022    8:38 AM 07/10/2022    4:20 AM 07/09/2022    9:20 PM  CMP  Glucose 70 - 99 mg/dL 132  118  127   BUN 8 - 23 mg/dL 48  32  31   Creatinine 0.61 - 1.24 mg/dL 3.67  3.50  3.61   Sodium 135 - 145 mmol/L 135  137  140   Potassium 3.5 - 5.1 mmol/L 3.5  3.4  3.5   Chloride 98 - 111 mmol/L 101  109  109   CO2 22 - 32 mmol/L _0 Calcium 8.9 - 10.3 mg/dL 8.6  9.3  9.6   Total Protein 6.5 - 8.1 g/dL 6.5   6.5   Total Bilirubin 0.3 - 1.2 mg/dL 0.7   0.6   Alkaline Phos 38 - 126 U/L 53   54   AST 15 - 41 U/L 28   14   ALT 0 - 44 U/L 54   21     DIAGNOSTIC IMAGING:  I have independently reviewed the scans and discussed with the patient. CT Orbits W Contrast  Result Date: 07/09/2022 CLINICAL DATA:  Orbital cellulitis suspected EXAM: CT ORBITS WITH CONTRAST TECHNIQUE: Multidetector CT images was performed according to the standard protocol following intravenous contrast administration. RADIATION DOSE REDUCTION: This exam was performed according to the departmental dose-optimization program which includes automated exposure control, adjustment of the mA and/or kV according to patient size and/or use of iterative reconstruction technique. CONTRAST:  52m OMNIPAQUE IOHEXOL 350 MG/ML SOLN COMPARISON:  None Available. FINDINGS: Orbits: No orbital mass. Mild hyperenhancement and thickening of the sclera of the right globe (series 31, image 27). Minimal fat stranding posterior to the right globe (series 33, image 29). No definite hyperenhancement of the right optic nerve sheath complex. Normal appearance of the extra-ocular muscles. Right proptosis. Normal appearance of the left orbit. No evidence of postseptal collection or subperiosteal abscess status post bilateral lens replacements. Visible paranasal sinuses: Clear. Soft tissues: Thickening and enhancement of the right eyelid and periorbital soft tissues.  Osseous: No fracture or aggressive lesion. Limited intracranial: Please see same day CTA head neck. IMPRESSION: Right periorbital soft tissue enhancement and thickening, consistent with preseptal cellulitis, with hyperenhancement and thickening of the right sclera and minimal fat stranding posterior to the right globe, concerning for early  orbital cellulitis. Electronically Signed   By: Merilyn Baba M.D.   On: 07/09/2022 23:10   CT VENOGRAM HEAD  Result Date: 07/09/2022 CLINICAL DATA:  Stroke suspected EXAM: CT ANGIOGRAPHY HEAD AND NECK CT VENOGRAM HEAD TECHNIQUE: Multidetector CT imaging of the head and neck was performed using the standard protocol during bolus administration of intravenous contrast. Multiplanar CT image reconstructions and MIPs were obtained to evaluate the vascular anatomy. Carotid stenosis measurements (when applicable) are obtained utilizing NASCET criteria, using the distal internal carotid diameter as the denominator. Venographic phase images of the brain were obtained following the administration of intravenous contrast. Multiplanar reformats and maximum intensity projections were generated. RADIATION DOSE REDUCTION: This exam was performed according to the departmental dose-optimization program which includes automated exposure control, adjustment of the mA and/or kV according to patient size and/or use of iterative reconstruction technique. CONTRAST:  54mL OMNIPAQUE IOHEXOL 350 MG/ML SOLN COMPARISON:  None Available. FINDINGS: CT HEAD FINDINGS Brain: No evidence of acute infarct, hemorrhage, mass, mass effect, or midline shift. No hydrocephalus or extra-axial fluid collection. Encephalomalacia in the anterior right frontal lobe. Periventricular white matter changes, likely the sequela of chronic small vessel ischemic disease. Remote lacunar infarcts in the pons Vascular: No hyperdense vessel. Skull: Normal. Negative for fracture or focal lesion. Sinuses/Orbits: No acute finding in the  sinuses. Please see same-day CT orbits for orbital findings. Other: The mastoids are not pneumatized. CTA NECK FINDINGS Aortic arch: Two-vessel arch with a common origin of the brachiocephalic and left common carotid arteries. Imaged portion shows no evidence of aneurysm or dissection. No significant stenosis of the major arch vessel origins. Mild aortic atherosclerosis. Right carotid system: No evidence of dissection, occlusion, or hemodynamically significant stenosis (greater than 50%). Atherosclerotic disease at the bifurcation and in the proximal ICA is not hemodynamically significant. Left carotid system: No evidence of dissection, occlusion, or hemodynamically significant stenosis (greater than 50%). Atherosclerotic disease at the bifurcation and in the proximal ICA is not hemodynamically significant. Significant tortuosity of the left ICA. Vertebral arteries: Evaluation of the proximal vertebral arteries bilaterally is somewhat limited by beam hardening artifact from the contrast bolus. Within this limitation, no evidence of dissection, occlusion, or hemodynamically significant stenosis (greater than 50%). Skeleton: No acute osseous abnormality. Degenerative changes in the cervical spine. Edentulous. Other neck: Medialization of the left vocal fold with abduction of the right vocal fold and enlargement of the right piriform sinus. Upper chest: No acute finding. Review of the MIP images confirms the above findings CTA HEAD FINDINGS Anterior circulation: Both internal carotid arteries are patent to the termini, without significant stenosis. Fusiform dilatation of the right cavernous carotid which measures up to 8 mm (series 18, image 118 and series 20, image 79), compared to 6 mm more proximally (series 20, image 82). A1 segments patent. Normal anterior communicating artery. Anterior cerebral arteries are irregular but patent to their distal aspects. Long segment moderate narrowing in the proximal left M1  (series 18, image 100) MCA branches perfused and symmetric. Posterior circulation: Vertebral arteries patent to the vertebrobasilar junction without stenosis. Posterior inferior cerebellar arteries patent proximally. Basilar patent to its distal aspect. Superior cerebellar arteries patent proximally. Patent but diminutive P1 segments. Patent bilateral posterior communicating arteries with near fetal origin of the bilateral PCAs. Multifocal mild narrowing and irregularity are noted in the left posterior communicating artery and bilateral PCAs. PCAs perfused to their distal aspects. Anatomic variants: Near fetal origin of the bilateral PCAs. Review of the MIP images confirms the  above findings CTV FINDINGS Superior sagittal sinus: Normal. Straight sinus: Normal. Inferior sagittal sinus, vein of Galen and internal cerebral veins: Normal. Transverse sinuses: Normal. Sigmoid sinuses: Normal. Visualized jugular veins: Normal IMPRESSION: 1. No intracranial large vessel occlusion. Moderate stenosis in the left M1 and mild multifocal narrowing in the bilateral PCAs. 2. Fusiform dilatation of the right cavernous carotid, measuring up to 8 mm 3.  No hemodynamically significant stenosis in the neck. 4.  No acute intracranial process. 5. Abduction of the right vocal fold with medial ization of the left vocal fold, concerning for right vocal cord paralysis. 6. No evidence of intracranial venous sinus thrombosis or stenosis. 7. For orbital findings, please see same-day CT orbits. Electronically Signed   By: Merilyn Baba M.D.   On: 07/09/2022 22:59   CT ANGIO HEAD NECK W WO CM  Result Date: 07/09/2022 CLINICAL DATA:  Stroke suspected EXAM: CT ANGIOGRAPHY HEAD AND NECK CT VENOGRAM HEAD TECHNIQUE: Multidetector CT imaging of the head and neck was performed using the standard protocol during bolus administration of intravenous contrast. Multiplanar CT image reconstructions and MIPs were obtained to evaluate the vascular anatomy.  Carotid stenosis measurements (when applicable) are obtained utilizing NASCET criteria, using the distal internal carotid diameter as the denominator. Venographic phase images of the brain were obtained following the administration of intravenous contrast. Multiplanar reformats and maximum intensity projections were generated. RADIATION DOSE REDUCTION: This exam was performed according to the departmental dose-optimization program which includes automated exposure control, adjustment of the mA and/or kV according to patient size and/or use of iterative reconstruction technique. CONTRAST:  85m OMNIPAQUE IOHEXOL 350 MG/ML SOLN COMPARISON:  None Available. FINDINGS: CT HEAD FINDINGS Brain: No evidence of acute infarct, hemorrhage, mass, mass effect, or midline shift. No hydrocephalus or extra-axial fluid collection. Encephalomalacia in the anterior right frontal lobe. Periventricular white matter changes, likely the sequela of chronic small vessel ischemic disease. Remote lacunar infarcts in the pons Vascular: No hyperdense vessel. Skull: Normal. Negative for fracture or focal lesion. Sinuses/Orbits: No acute finding in the sinuses. Please see same-day CT orbits for orbital findings. Other: The mastoids are not pneumatized. CTA NECK FINDINGS Aortic arch: Two-vessel arch with a common origin of the brachiocephalic and left common carotid arteries. Imaged portion shows no evidence of aneurysm or dissection. No significant stenosis of the major arch vessel origins. Mild aortic atherosclerosis. Right carotid system: No evidence of dissection, occlusion, or hemodynamically significant stenosis (greater than 50%). Atherosclerotic disease at the bifurcation and in the proximal ICA is not hemodynamically significant. Left carotid system: No evidence of dissection, occlusion, or hemodynamically significant stenosis (greater than 50%). Atherosclerotic disease at the bifurcation and in the proximal ICA is not hemodynamically  significant. Significant tortuosity of the left ICA. Vertebral arteries: Evaluation of the proximal vertebral arteries bilaterally is somewhat limited by beam hardening artifact from the contrast bolus. Within this limitation, no evidence of dissection, occlusion, or hemodynamically significant stenosis (greater than 50%). Skeleton: No acute osseous abnormality. Degenerative changes in the cervical spine. Edentulous. Other neck: Medialization of the left vocal fold with abduction of the right vocal fold and enlargement of the right piriform sinus. Upper chest: No acute finding. Review of the MIP images confirms the above findings CTA HEAD FINDINGS Anterior circulation: Both internal carotid arteries are patent to the termini, without significant stenosis. Fusiform dilatation of the right cavernous carotid which measures up to 8 mm (series 18, image 118 and series 20, image 79), compared to 6 mm more proximally (series  20, image 82). A1 segments patent. Normal anterior communicating artery. Anterior cerebral arteries are irregular but patent to their distal aspects. Long segment moderate narrowing in the proximal left M1 (series 18, image 100) MCA branches perfused and symmetric. Posterior circulation: Vertebral arteries patent to the vertebrobasilar junction without stenosis. Posterior inferior cerebellar arteries patent proximally. Basilar patent to its distal aspect. Superior cerebellar arteries patent proximally. Patent but diminutive P1 segments. Patent bilateral posterior communicating arteries with near fetal origin of the bilateral PCAs. Multifocal mild narrowing and irregularity are noted in the left posterior communicating artery and bilateral PCAs. PCAs perfused to their distal aspects. Anatomic variants: Near fetal origin of the bilateral PCAs. Review of the MIP images confirms the above findings CTV FINDINGS Superior sagittal sinus: Normal. Straight sinus: Normal. Inferior sagittal sinus, vein of Galen  and internal cerebral veins: Normal. Transverse sinuses: Normal. Sigmoid sinuses: Normal. Visualized jugular veins: Normal IMPRESSION: 1. No intracranial large vessel occlusion. Moderate stenosis in the left M1 and mild multifocal narrowing in the bilateral PCAs. 2. Fusiform dilatation of the right cavernous carotid, measuring up to 8 mm 3.  No hemodynamically significant stenosis in the neck. 4.  No acute intracranial process. 5. Abduction of the right vocal fold with medial ization of the left vocal fold, concerning for right vocal cord paralysis. 6. No evidence of intracranial venous sinus thrombosis or stenosis. 7. For orbital findings, please see same-day CT orbits. Electronically Signed   By: Merilyn Baba M.D.   On: 07/09/2022 22:59   B-Scan Ultrasound - OS - Left Eye  Result Date: 07/09/2022 Quality was good. Notes Optically empty vitreous cavity.  No retinal tears or detachment.  No obvious infiltrates or debris    ASSESSMENT:  1.  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  Basis for CKD is hypertension associated arteriosclerosis with arterionephrosclerosis. - Bone marrow biopsy on 02/21/2021 with slightly hypercellular bone marrow with trilineage hematopoiesis.  Plasma cells increased in number representing 11% of all cells with kappa light chain restriction. - Myeloma FISH panel with no evidence of T p53.  Plasma cell enrichment yielded limited cellularity, therefore only tested for T p53 probes at. - Chromosome analysis 45, X,- Y (8)/46, XY (12) - Labs on 02/01/2021-M spike 2 g, kappa light chain 794, free light chain ratio 49.97.  Beta-2 microglobulin 5.5, albumin 3.5.  LDH normal. - PET scan on 03/08/2021 with no evidence of active myeloma.  No soft tissue plasmacytoma. - 24-hour urine total protein 2.2 g on 03/13/2021.  Immunofixation positive for IgG  kappa. - 4 cycles of CyBorD from 03/14/2021 through 07/04/2021 held due to fluid retention.  Myeloma labs on 08/08/2021 showed progression. - Daratumumab, Velcade and dexamethasone started on 08/30/2021.   2.  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  Smoked 2 packs/day for 32 years and quit smoking cigarettes and vaping now. -Mother had small cell lung cancer.   3.  CKD: -He has stage V CKD thought to be secondary to hypertension. -Renal ultrasound on 12/19/2020 was negative for obstructive uropathy and findings were consistent with chronic medical renal disease.  Bilateral kidney cysts were seen. -He has 2.7 g of proteinuria on 24-hour urine. -Kidney biopsy was done on 01/25/2021.   PLAN:  1.  Stage II, standard risk IgG kappa multiple myeloma: - Last treatment with Velcade was on 06/27/2022. - Last M spike  was 0.2 g on 06/27/2022.  Kappa light chains were 38.6 and ratio was 3.22.  Immunofixation shows IgG kappa.  Darzalex specific immunofixation shows IgG kappa after sample has been treated to limit IgG kappa interference from Darzalex. - We will hold his treatment until the etiology of the right eye vision loss is found. - RTC 2 weeks for follow-up.   2.  Smoking history: - Last CT chest on 02/15/2021 was lung RADS 1.   3.  ID prophylaxis: - Continue aspirin 81 mg daily and acyclovir twice daily.   6.  Lower extremity swelling: - Continue furosemide twice daily.  7.  ESRD on HD: - Peritoneal dialysis started on 05/15/2022 and going well.  8.  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  I have reviewed all records from [facility] - Was thought to be possibly due to ischemic event.  They have recommended hypercoagulable work-up. - I have reviewed hypercoagulable labs including protein C, protein S, Antithrombin III,  factor V Leiden, prothrombin gene mutations which were negative.  He is currently on steroids. - He was found to have elevated inflammatory tumor markers including ESR, CRP, C3 and C4. - His multiple myeloma is under control and unlikely etiology for this event. - He has a follow-up with [facility] ophthalmology on 08/07/2022.   Orders placed this encounter:  No orders of the defined types were placed in this encounter.    Derek Jack, MD [place] [phone]

## 2022-08-01 NOTE — Progress Notes (Signed)
Patient seen and examined today by Dr. Delton Coombes. Treatment held today and until further notice per ophthalmology. Primary RN and Pharmacy made aware.

## 2022-08-01 NOTE — Progress Notes (Signed)
Message received from Sylvester Harder RN / Dr. Delton Coombes treatment held today until further notice due to ophthalmologist.

## 2022-08-01 NOTE — Patient Instructions (Addendum)
[place]  Discharge Instructions  You were seen and examined today by Dr. Delton Coombes.  No treatment today, your Myeloma is under control so we will hold treatment until Opthalmology has cleared you from their standpoint.  Follow-up as scheduled.  Thank you for choosing [facility] to provide your oncology and hematology care.   To afford each patient quality time with our provider, please arrive at least 15 minutes before your scheduled appointment time. You may need to reschedule your appointment if you arrive late (10 or more minutes). Arriving late affects you and other patients whose appointments are after yours.  Also, if you miss three or more appointments without notifying the office, you may be dismissed from the clinic at the provider's discretion.    Again, thank you for choosing [facility].  Our hope is that these requests will decrease the amount of time that you wait before being seen by our physicians.   If you have a lab appointment with the [facility] please come in thru the Main Entrance and check in at the main information desk.           _____________________________________________________________  Should you have questions after your visit to [facility], please contact our office at [phone] and follow the prompts.  Our office hours are 8:00 a.m. to 4:30 p.m. Monday - Thursday and 8:00 a.m. to 2:30 p.m. Friday.  Please note that voicemails left after 4:00 p.m. may not be returned until the following business day.  We are closed weekends and all major holidays.  You do have access to a nurse 24-7, just call the main number to the clinic [phone] and do not press any options, hold on the line and a nurse will answer the phone.    For prescription refill requests, have your pharmacy contact our office and allow 72 hours.    Masks are optional in the cancer centers. If you would  like for your care team to wear a mask while they are taking care of you, please let them know. You may have one support person who is at least 65 years old accompany you for your appointments.

## 2022-08-02 ENCOUNTER — Encounter: Payer: Self-pay | Admitting: Internal Medicine

## 2022-08-02 ENCOUNTER — Ambulatory Visit (INDEPENDENT_AMBULATORY_CARE_PROVIDER_SITE_OTHER): Payer: Medicare HMO | Admitting: Internal Medicine

## 2022-08-02 VITALS — BP 94/70 | HR 100 | Resp 16 | Ht 67.0 in | Wt 250.8 lb

## 2022-08-02 DIAGNOSIS — Z992 Dependence on renal dialysis: Secondary | ICD-10-CM | POA: Diagnosis not present

## 2022-08-02 DIAGNOSIS — Z09 Encounter for follow-up examination after completed treatment for conditions other than malignant neoplasm: Secondary | ICD-10-CM | POA: Diagnosis not present

## 2022-08-02 DIAGNOSIS — C9000 Multiple myeloma not having achieved remission: Secondary | ICD-10-CM

## 2022-08-02 DIAGNOSIS — N186 End stage renal disease: Secondary | ICD-10-CM | POA: Diagnosis not present

## 2022-08-02 DIAGNOSIS — H3091 Unspecified chorioretinal inflammation, right eye: Secondary | ICD-10-CM | POA: Diagnosis not present

## 2022-08-02 NOTE — Assessment & Plan Note (Signed)
Hospital chart reviewed, including discharge summary Was admitted for posterior uveitis, has had ophthalmology follow-up Concern for ischemic or embolic event Will discuss with cardiology for possible Holter monitor

## 2022-08-02 NOTE — Assessment & Plan Note (Signed)
On Velcade Followed by heme-onc

## 2022-08-02 NOTE — Progress Notes (Signed)
Established Patient Office Visit  Subjective:  Patient ID: Ernest Barnett, male    DOB: 05-22-1956  Age: 66 y.o. MRN: [id number]  CC:  Chief Complaint  Patient presents with   Transitions Of Care    D/c 9/15 from [facility] hospital     HPI Ernest Barnett is a 66 y.o. male with past medical history of HTN, ESRD, COPD, multiple myeloma, GERD, HLD and morbid obesity who presents for f/u after recent hospitalization for posterior scleritis of right eye.  Patient states that on 9/3, he began to have pressure around the periorbital region of his right eye and some loss of vision. Over the course of a day, his symptoms worsened and his right eye became red, painful, and bulging. He was evaluated at [facility] on 9/4 and was referred to [facility] given concern for endophthalmitis. At [facility], a corneal infiltrate was noticed with no signs of endophthalmitis on B-scan. The patient's globe appeared stunned at that time concerning for orbital apex syndrome, with concerns for underlying viral etiology. Patient was started on fortified vanc and gent and transferred to [facility]. Patient notes that he now has full vision loss of the right eye.   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 He was treated with vancomycin and tobramycin eye drops (9/7-9/11) and empiric IV Vanco and cefepime-->ceftriaxone (9/7-9/12) as MRI of the globe showed concern for possible edema (most likely per discussion with radiology) versus early abscess formation surrounding the eye. Due to concern for HSV, he was also treated with IV acyclovir (renally dosed). Corneal culture with <1+ Staph epi (suspected skin contaminant), and HSV and VZV swabs were negative. Ophthalmology followed closely. Given atypical presentation and posterior scleritis, also considered inflammatory disorders and systemic work-up  revealed normal rheumatoid factor, uric acid, RPR, ANCA, CCP, ANA, ACE, and negative Quant gold. Elevated ESR 107, CRP 153, C3 178, and C4 43.6. A temporal artery PVL was obtained to evaluate for GCA and was negative. Ultimately he did not improve. Ophthalmology do not expect any recovery of vision in his right eye. He underwent Gunderson flap and tarsorrhaphy with Ophthalmology on 07/15/22. Vanc and tobra eye drops and IV vanc/ceftriaxone were discontinued at that time, and he was started on moxifloxacin eye drops. He was started on prednisone taper on 9/14 and should continue that until advised otherwise by Ophthalmology.  He has been following up with ophthalmology clinic at [facility].  He has vision loss on right side.  He has limited vision on left side.  He has been on taper dose of oral prednisone.  Of note, he reports difficulty walking on the day when he had sudden vision loss.  He has had hematology evaluation for coagulation defects, which were unremarkable.  He is currently taking aspirin and statin.  Denies any numbness or tingling of the UE or LE.  He had PT evaluation at home, but has been discharged from home PT.  He has resumed his peritoneal dialysis now.   Past Medical History:  Diagnosis Date   Chronic kidney disease    COPD (chronic obstructive pulmonary disease) ([place])    CVA (cerebral vascular accident) ([place]) 2013   Cystoid macular edema of left eye 03/07/2021   GERD (gastroesophageal reflux disease)    HOH (hard of hearing)    Hypercholesteremia    Hypertension    Left epiretinal membrane 03/07/2021  Pseudophakia 03/07/2021   Stroke ([place]) 03/24/2012   left sided weakness   Vitamin D deficiency     Past Surgical History:  Procedure Laterality Date   A/V FISTULAGRAM Right 01/29/2022   Procedure: A/V Fistulagram;  Surgeon: Serafina Mitchell, MD;  Location: [place] [facility];  Service: Cardiovascular;  Laterality: Right;   AV FISTULA PLACEMENT Right 02/08/2021   Procedure: RIGHT ARM  ARTERIOVENOUS (AV) FISTULA CREATION;  Surgeon: [name], MD;  Location: [facility];  Service: Vascular;  Laterality: Right;   BIOPSY  11/12/2018   Procedure: BIOPSY;  Surgeon: Rogene Houston, MD;  Location: [facility];  Service: Endoscopy;;  colon   BIOPSY  05/10/2020   Procedure: BIOPSY;  Surgeon: Rogene Houston, MD;  [facility];  Service: Endoscopy;;  esophagus   CATARACT EXTRACTION     right eye   CATARACT EXTRACTION W/PHACO  10/08/2012   Procedure: CATARACT EXTRACTION PHACO AND INTRAOCULAR LENS PLACEMENT ([place]);  Surgeon: Tonny Branch, MD;  Location: [facility];  Service: Ophthalmology;  Laterality: Left;  CDE:6.64   CHOLECYSTECTOMY     [place]   COLONOSCOPY N/A 11/12/2018   Procedure: COLONOSCOPY;  Surgeon: Rogene Houston, MD;  Location[facility];  Service: Endoscopy;  Laterality: N/A;  1030   ELBOW FRACTURE SURGERY     left   ESOPHAGEAL DILATION N/A 11/12/2018   Procedure: ESOPHAGEAL DILATION;  Surgeon: Rogene Houston, MD;  [facility];  Service: Endoscopy;  Laterality: N/A;   ESOPHAGEAL DILATION N/A 05/10/2020   Procedure: ESOPHAGEAL DILATION;  Surgeon: Rogene Houston, MD;  [facility];  Service: Endoscopy;  Laterality: N/A;   ESOPHAGOGASTRODUODENOSCOPY N/A 11/12/2018   Procedure: ESOPHAGOGASTRODUODENOSCOPY (EGD);  Surgeon: Rogene Houston, MD;  [facility];  Service: Endoscopy;  Laterality: N/A;   ESOPHAGOGASTRODUODENOSCOPY N/A 05/10/2020   Procedure: ESOPHAGOGASTRODUODENOSCOPY (EGD);  Surgeon: Rogene Houston, MD;  [facility];  Service: Endoscopy;  Laterality: N/A;  210   HERNIA REPAIR     right inguinal   HYDROCELE EXCISION / REPAIR     IR FLUORO GUIDE CV LINE LEFT  08/17/2021   IR US GUIDE VASC ACCESS LEFT  08/17/2021   LIGATION OF COMPETING BRANCHES OF ARTERIOVENOUS FISTULA Right 09/11/2021   Procedure: LIGATION OF COMPETING BRANCHES OF RIGHT ARM ARTERIOVENOUS FISTULA;  Surgeon: [name], MD;  Location: [facility];   Service: Vascular;  Laterality: Right;   PERIPHERAL VASCULAR BALLOON ANGIOPLASTY Right 01/29/2022   Procedure: PERIPHERAL VASCULAR BALLOON ANGIOPLASTY;  Surgeon: Serafina Mitchell, MD;  Location: [place] [facility];  Service: Cardiovascular;  Laterality: Right;  arm fistula   POLYPECTOMY  11/12/2018   Procedure: POLYPECTOMY;  Surgeon: Rogene Houston, MD;  [facility];  Service: Endoscopy;;  colon    RETINAL DETACHMENT SURGERY Left 2019   SPLENECTOMY, TOTAL     TONSILLECTOMY     VOCAL CORD INJECTION     removal of polyp-2005    History reviewed. No pertinent family history.  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  Tobacco Use   Smoking status: Former    Packs/day: 3.00  Years: 30.00    Total pack years: 90.00    Types: Cigarettes   Smokeless tobacco: Current   Tobacco comments:    Currently vape  Vaping Use   Vaping Use: Every day   Substances: Nicotine  Substance and Sexual Activity   Alcohol use: Not Currently    Alcohol/week: 1.0 - 2.0 standard drink of alcohol    Types: 1 - 2 Cans of beer per week    Comment: Occasionally   Drug use: No   Sexual activity: Not Currently    Birth control/protection: Abstinence, None  Other Topics Concern   Not on file  Social History Narrative   2 children, both nearby   Social Determinants of Health   Financial Resource Strain: Low Risk  (05/30/2022)   Overall Financial Resource Strain (CARDIA)    Difficulty of Paying Living Expenses: Not hard at all  Food Insecurity: No Food Insecurity (05/30/2022)   Hunger Vital Sign    Worried About Running Out of Food in the Last Year: Never true    Ran Out of Food in the Last Year: Never true  Transportation Needs: No  Transportation Needs (05/30/2022)   PRAPARE - Hydrologist (Medical): No    Lack of Transportation (Non-Medical): No  Physical Activity: Unknown (05/30/2022)   Exercise Vital Sign    Days of Exercise per Week: Not on file    Minutes of Exercise per Session: 0 min  Stress: No Stress Concern Present (05/30/2022)   [place]    Feeling of Stress : Not at all  Social Connections: Socially Isolated (05/30/2022)   Social Connection and Isolation Panel [NHANES]    Frequency of Communication with Friends and Family: More than three times a week    Frequency of Social Gatherings with Friends and Family: More than three times a week    Attends Religious Services: Never    Marine scientist or Organizations: No    Attends Archivist Meetings: Never    Marital Status: Divorced  Human resources officer Violence: Not At Risk (05/30/2022)   Humiliation, Afraid, Rape, and Kick questionnaire    Fear of Current or Ex-Partner: No    Emotionally Abused: No    Physically Abused: No    Sexually Abused: No    Outpatient Medications Prior to Visit  Medication Sig Dispense Refill   acyclovir (ZOVIRAX) 200 MG capsule Take 200 mg by mouth 2 (two) times daily.     albuterol (VENTOLIN HFA) 108 (90 Base) MCG/ACT inhaler Inhale 2 puffs into the lungs every 6 (six) hours as needed for wheezing or shortness of breath. 8 g 5   aspirin EC 81 MG tablet Take 1 tablet (81 mg total) by mouth daily. 30 tablet 11   budesonide-formoterol (SYMBICORT) 80-4.5 MCG/ACT inhaler Inhale 2 puffs into the lungs 2 (two) times daily as needed (shortness of breath). 10.2 each 5   dexamethasone (DECADRON) 4 MG tablet Take 2.5 tablets (10 mg total) by mouth once a week. Take at home along with Tylenol and Benadryl weekly on the day of daratumumab injection. 30 tablet 2   docusate sodium (COLACE) 100 MG capsule Take 100 mg by mouth daily.      gabapentin (NEURONTIN) 300 MG capsule Take 1 capsule by mouth twice daily 60 capsule 6   Multiple Vitamins-Minerals (MULTIVITAMIN MEN 50+ PO) Take 1 tablet by mouth every morning.     multivitamin (RENA-VIT) TABS tablet  Take 1 tablet by mouth daily.     oxyCODONE (OXY IR/ROXICODONE) 5 MG immediate release tablet Take 1 tablet (5 mg total) by mouth 2 (two) times daily as needed for severe pain. 30 tablet 0   pantoprazole (PROTONIX) 40 MG tablet Take 1 tablet (40 mg total) by mouth daily. 90 tablet 3   polyethylene glycol (MIRALAX / GLYCOLAX) packet Take 17 g by mouth daily. Patient states that he takes as needed. 30 each 5   pravastatin (PRAVACHOL) 80 MG tablet Take 1 tablet (80 mg total) by mouth daily. 90 tablet 3   prednisoLONE acetate (PRED FORTE) 1 % ophthalmic suspension Administer 1 drop into the left eye four (4) times a day for 7 days, THEN 1 drop Three (3) times a day before meals for 7 days, THEN 1 drop Two (2) times a day for 7 days, THEN 1 drop daily for 7 days.     predniSONE (DELTASONE) 20 MG tablet Take by mouth.     senna (SENOKOT) 8.6 MG tablet Take 2 tablets by mouth at bedtime.     tamsulosin (FLOMAX) 0.4 MG CAPS capsule Take 1 capsule by mouth once daily 30 capsule 3   tiZANidine (ZANAFLEX) 2 MG tablet TAKE 1 TABLET BY MOUTH EVERY 6 HOURS AS NEEDED FOR MUSCLE SPASM 30 tablet 0   torsemide (DEMADEX) 100 MG tablet Take 100 mg by mouth 2 (two) times daily.     atropine 1 % ophthalmic solution Place 1 drop into the right eye 4 (four) times daily. (Patient not taking: Reported on 08/02/2022) 2 mL 12   bortezomib IV (VELCADE) 3.5 MG injection Inject 1.5 mg/m2 into the vein once a week. (Patient not taking: Reported on 08/02/2022)     brinzolamide (AZOPT) 1 % ophthalmic suspension Place 1 drop into the right eye 3 (three) times daily. (Patient not taking: Reported on 08/02/2022)     ceFEPIme 1 g in sodium chloride 0.9 % 100 mL Inject 1 g into the vein daily. (Patient not taking: Reported on  08/02/2022)     midodrine (PROAMATINE) 10 MG tablet Take by mouth.     Omega-3 Fatty Acids (FISH OIL) 1000 MG CAPS Take 1,000 mg by mouth in the morning, at noon, and at bedtime.  (Patient not taking: Reported on 08/02/2022)     ranolazine (RANEXA) 500 MG 12 hr tablet Take 500 mg by mouth daily. (Patient not taking: Reported on 08/02/2022)     sildenafil (REVATIO) 20 MG tablet Take 1 tablet (20 mg total) by mouth 5 (five) times daily as needed. (Patient not taking: Reported on 08/02/2022) 30 tablet 2   Gentamicin Sulfate (GENTAMICIN FORTIFIED) 7 ml SOLN Place 0.05 mLs (1 drop total) into the right eye every hour. (Patient not taking: Reported on 08/02/2022)     Gentamicin Sulfate (GENTAMICIN FORTIFIED) 7 ml SOLN Place 0.05 mLs (1 drop total) into the right eye every 4 (four) hours. (Patient not taking: Reported on 08/02/2022)     HYDROcodone-acetaminophen (NORCO/VICODIN) 5-325 MG tablet Take by mouth.     moxifloxacin (VIGAMOX) 0.5 % ophthalmic solution Apply to eye. (Patient not taking: Reported on 08/02/2022)     moxifloxacin (VIGAMOX) 0.5 % ophthalmic solution Administer 1 drop to the right eye four (4) times a day. (Patient not taking: Reported on 08/02/2022)     polyethylene glycol powder (GLYCOLAX/MIRALAX) 17 GM/SCOOP powder Take by mouth.     predniSONE (DELTASONE) 20 MG tablet Take by mouth.     vancomycin (VANCOCIN) 25 mg/mL  SOLN Apply 1 drop to eye every hour.     Facility-Administered Medications Prior to Visit  Medication Dose Route Frequency Provider Last Rate Last Admin   palonosetron (ALOXI) 0.25 MG/5ML injection             No Known Allergies  ROS Review of Systems  Constitutional:  Negative for chills and fever.  HENT:  Negative for congestion and sore throat.   Eyes:  Positive for visual disturbance. Negative for pain and discharge.  Respiratory:  Negative for cough and shortness of breath.   Cardiovascular:  Positive for leg swelling. Negative for chest pain and palpitations.   Gastrointestinal:  Negative for diarrhea, nausea and vomiting.  Endocrine: Negative for polydipsia and polyuria.  Genitourinary:  Negative for dysuria and hematuria.  Musculoskeletal:  Positive for arthralgias and back pain. Negative for neck pain and neck stiffness.  Skin:  Negative for rash.  Neurological:  Negative for dizziness, weakness, numbness and headaches.  Psychiatric/Behavioral:  Negative for agitation and behavioral problems.       Objective:    Physical Exam Vitals reviewed.  Constitutional:      General: He is not in acute distress.    Appearance: He is obese. He is not diaphoretic.  HENT:     Head: Normocephalic and atraumatic.     Nose: Nose normal.     Mouth/Throat:     Mouth: Mucous membranes are moist.  Eyes:     General: No scleral icterus.    Extraocular Movements: Extraocular movements intact.     Comments: Right eye blindness  Cardiovascular:     Rate and Rhythm: Normal rate and regular rhythm.     Heart sounds: Normal heart sounds. No murmur heard. Pulmonary:     Breath sounds: Normal breath sounds. No wheezing or rales.  Abdominal:     Comments: Has peritoneal dialysis catheter  Musculoskeletal:     Cervical back: Neck supple. No tenderness.     Lumbar back: Tenderness present. Decreased range of motion.     Right lower leg: Edema (1+) present.     Left lower leg: Edema (1+) present.  Skin:    General: Skin is warm.     Findings: No rash.  Neurological:     General: No focal deficit present.     Mental Status: He is alert and oriented to person, place, and time.     Sensory: No sensory deficit.     Motor: No weakness.  Psychiatric:        Mood and Affect: Mood normal.        Behavior: Behavior normal.     BP 94/70   Pulse 100   Resp 16   Ht _0  (1.702 m)   Wt 250 lb 12.8 oz (113.8 kg)   SpO2 95%   BMI 39.28 kg/m  Wt Readings from Last 3 Encounters:  08/02/22 250 lb 12.8 oz (113.8 kg)  07/09/22 254 lb (115.2 kg)  07/07/22 254  lb (115.2 kg)    Lab Results  Component Value Date   TSH 1.200 06/20/2021   Lab Results  Component Value Date   WBC 4.6 08/01/2022   HGB 11.2 (L) 08/01/2022   HCT 32.8 (L) 08/01/2022   MCV 102.8 (H) 08/01/2022   PLT 113 (L) 08/01/2022   Lab Results  Component Value Date   NA 135 07/25/2022   K 3.5 07/25/2022   CO2 22 07/25/2022   GLUCOSE 132 (H) 07/25/2022   BUN 48 (H) 07/25/2022  CREATININE 3.67 (H) 07/25/2022   BILITOT 0.7 07/25/2022   ALKPHOS 53 07/25/2022   AST 28 07/25/2022   ALT 54 (H) 07/25/2022   PROT 6.5 07/25/2022   ALBUMIN 3.6 07/25/2022   CALCIUM 8.6 (L) 07/25/2022   ANIONGAP 12 07/25/2022   Lab Results  Component Value Date   CHOL 179 06/20/2021   Lab Results  Component Value Date   HDL 34 (L) 06/20/2021   Lab Results  Component Value Date   LDLCALC 106 (H) 06/20/2021   Lab Results  Component Value Date   TRIG 223 (H) 06/20/2021   No results found for: "CHOLHDL" No results found for: "HGBA1C"    Assessment & Plan:   Problem List Items Addressed This Visit       Other   Multiple myeloma without remission ([place])    On Velcade Followed by heme-onc      ESRD on peritoneal dialysis ([place])    Has peritoneal dialysis catheter now as well Followed by nephrology On torsemide On sodium bicarbonate and vitamin D supplement      Posterior uveitis, right - Primary    S/p abx treatment S/p Gunderson flap and tarsorrhaphy Followed by ophthalmology at [facility] On tapered oral prednisone      Hospital discharge follow-up    Hospital chart reviewed, including discharge summary Was admitted for posterior uveitis, has had ophthalmology follow-up Concern for ischemic or embolic event Will discuss with cardiology for possible Holter monitor       No orders of the defined types were placed in this encounter.   Follow-up: Return if symptoms worsen or fail to improve.    Lindell Spar, MD

## 2022-08-02 NOTE — Assessment & Plan Note (Signed)
S/p abx treatment S/p Gunderson flap and tarsorrhaphy Followed by ophthalmology at [facility] On tapered oral prednisone

## 2022-08-02 NOTE — Patient Instructions (Signed)
Please continue taking medications as prescribed.  Please continue to follow low salt diet and ambulate as tolerated.  Please continue using eye drops as advised by Ophthalmology.

## 2022-08-02 NOTE — Assessment & Plan Note (Signed)
Has peritoneal dialysis catheter now as well Followed by nephrology On torsemide On sodium bicarbonate and vitamin D supplement

## 2022-08-03 DIAGNOSIS — N186 End stage renal disease: Secondary | ICD-10-CM | POA: Diagnosis not present

## 2022-08-03 DIAGNOSIS — Z992 Dependence on renal dialysis: Secondary | ICD-10-CM | POA: Diagnosis not present

## 2022-08-05 ENCOUNTER — Encounter: Payer: Self-pay | Admitting: *Deleted

## 2022-08-05 DIAGNOSIS — Z992 Dependence on renal dialysis: Secondary | ICD-10-CM | POA: Diagnosis not present

## 2022-08-05 DIAGNOSIS — N186 End stage renal disease: Secondary | ICD-10-CM | POA: Diagnosis not present

## 2022-08-06 ENCOUNTER — Other Ambulatory Visit: Payer: Self-pay

## 2022-08-06 DIAGNOSIS — N186 End stage renal disease: Secondary | ICD-10-CM | POA: Diagnosis not present

## 2022-08-06 DIAGNOSIS — Z87891 Personal history of nicotine dependence: Secondary | ICD-10-CM

## 2022-08-06 DIAGNOSIS — Z992 Dependence on renal dialysis: Secondary | ICD-10-CM | POA: Diagnosis not present

## 2022-08-06 DIAGNOSIS — Z122 Encounter for screening for malignant neoplasm of respiratory organs: Secondary | ICD-10-CM

## 2022-08-06 NOTE — Progress Notes (Signed)
LDCT order placed per protocol °

## 2022-08-07 DIAGNOSIS — N186 End stage renal disease: Secondary | ICD-10-CM | POA: Diagnosis not present

## 2022-08-07 DIAGNOSIS — Z992 Dependence on renal dialysis: Secondary | ICD-10-CM | POA: Diagnosis not present

## 2022-08-08 ENCOUNTER — Inpatient Hospital Stay: Payer: Medicare HMO

## 2022-08-08 DIAGNOSIS — N186 End stage renal disease: Secondary | ICD-10-CM | POA: Diagnosis not present

## 2022-08-08 DIAGNOSIS — Z992 Dependence on renal dialysis: Secondary | ICD-10-CM | POA: Diagnosis not present

## 2022-08-09 DIAGNOSIS — Z992 Dependence on renal dialysis: Secondary | ICD-10-CM | POA: Diagnosis not present

## 2022-08-09 DIAGNOSIS — N186 End stage renal disease: Secondary | ICD-10-CM | POA: Diagnosis not present

## 2022-08-12 DIAGNOSIS — N186 End stage renal disease: Secondary | ICD-10-CM | POA: Diagnosis not present

## 2022-08-12 DIAGNOSIS — Z992 Dependence on renal dialysis: Secondary | ICD-10-CM | POA: Diagnosis not present

## 2022-08-13 DIAGNOSIS — N186 End stage renal disease: Secondary | ICD-10-CM | POA: Diagnosis not present

## 2022-08-13 DIAGNOSIS — Z992 Dependence on renal dialysis: Secondary | ICD-10-CM | POA: Diagnosis not present

## 2022-08-14 DIAGNOSIS — N186 End stage renal disease: Secondary | ICD-10-CM | POA: Diagnosis not present

## 2022-08-14 DIAGNOSIS — Z992 Dependence on renal dialysis: Secondary | ICD-10-CM | POA: Diagnosis not present

## 2022-08-15 DIAGNOSIS — Z992 Dependence on renal dialysis: Secondary | ICD-10-CM | POA: Diagnosis not present

## 2022-08-15 DIAGNOSIS — N186 End stage renal disease: Secondary | ICD-10-CM | POA: Diagnosis not present

## 2022-08-16 DIAGNOSIS — H5711 Ocular pain, right eye: Secondary | ICD-10-CM | POA: Diagnosis not present

## 2022-08-16 DIAGNOSIS — H15031 Posterior scleritis, right eye: Secondary | ICD-10-CM | POA: Diagnosis not present

## 2022-08-16 DIAGNOSIS — H16001 Unspecified corneal ulcer, right eye: Secondary | ICD-10-CM | POA: Diagnosis not present

## 2022-08-16 DIAGNOSIS — N186 End stage renal disease: Secondary | ICD-10-CM | POA: Diagnosis not present

## 2022-08-16 DIAGNOSIS — C9000 Multiple myeloma not having achieved remission: Secondary | ICD-10-CM | POA: Diagnosis not present

## 2022-08-16 DIAGNOSIS — Z992 Dependence on renal dialysis: Secondary | ICD-10-CM | POA: Diagnosis not present

## 2022-08-16 DIAGNOSIS — H54415A Blindness right eye category 5, normal vision left eye: Secondary | ICD-10-CM | POA: Diagnosis not present

## 2022-08-19 ENCOUNTER — Other Ambulatory Visit: Payer: Self-pay

## 2022-08-19 ENCOUNTER — Inpatient Hospital Stay: Payer: Medicare HMO | Attending: Hematology

## 2022-08-19 ENCOUNTER — Inpatient Hospital Stay: Payer: Medicare HMO

## 2022-08-19 ENCOUNTER — Inpatient Hospital Stay (HOSPITAL_BASED_OUTPATIENT_CLINIC_OR_DEPARTMENT_OTHER): Payer: Medicare HMO | Admitting: Hematology

## 2022-08-19 VITALS — BP 98/67 | HR 114 | Temp 97.5°F | Resp 19 | Ht 66.5 in | Wt 239.5 lb

## 2022-08-19 DIAGNOSIS — C9000 Multiple myeloma not having achieved remission: Secondary | ICD-10-CM | POA: Insufficient documentation

## 2022-08-19 DIAGNOSIS — N186 End stage renal disease: Secondary | ICD-10-CM | POA: Diagnosis not present

## 2022-08-19 DIAGNOSIS — Z79899 Other long term (current) drug therapy: Secondary | ICD-10-CM | POA: Insufficient documentation

## 2022-08-19 DIAGNOSIS — Z992 Dependence on renal dialysis: Secondary | ICD-10-CM | POA: Diagnosis not present

## 2022-08-19 LAB — CBC WITH DIFFERENTIAL/PLATELET
Abs Immature Granulocytes: 0.1 10*3/uL — ABNORMAL HIGH (ref 0.00–0.07)
Band Neutrophils: 2 %
Basophils Absolute: 0 10*3/uL (ref 0.0–0.1)
Basophils Relative: 0 %
Eosinophils Absolute: 0.1 10*3/uL (ref 0.0–0.5)
Eosinophils Relative: 2 %
HCT: 33.2 % — ABNORMAL LOW (ref 39.0–52.0)
Hemoglobin: 11.3 g/dL — ABNORMAL LOW (ref 13.0–17.0)
Lymphocytes Relative: 59 %
Lymphs Abs: 2.7 10*3/uL (ref 0.7–4.0)
MCH: 35.5 pg — ABNORMAL HIGH (ref 26.0–34.0)
MCHC: 34 g/dL (ref 30.0–36.0)
MCV: 104.4 fL — ABNORMAL HIGH (ref 80.0–100.0)
Metamyelocytes Relative: 2 %
Monocytes Absolute: 0.7 10*3/uL (ref 0.1–1.0)
Monocytes Relative: 16 %
Neutro Abs: 0.9 10*3/uL — ABNORMAL LOW (ref 1.7–7.7)
Neutrophils Relative %: 19 %
Platelets: 118 10*3/uL — ABNORMAL LOW (ref 150–400)
RBC: 3.18 MIL/uL — ABNORMAL LOW (ref 4.22–5.81)
RDW: 18.6 % — ABNORMAL HIGH (ref 11.5–15.5)
WBC: 4.5 10*3/uL (ref 4.0–10.5)
nRBC: 2.4 % — ABNORMAL HIGH (ref 0.0–0.2)
nRBC: 4 /100 WBC — ABNORMAL HIGH

## 2022-08-19 LAB — COMPREHENSIVE METABOLIC PANEL
ALT: 30 U/L (ref 0–44)
AST: 27 U/L (ref 15–41)
Albumin: 3.8 g/dL (ref 3.5–5.0)
Alkaline Phosphatase: 62 U/L (ref 38–126)
Anion gap: 13 (ref 5–15)
BUN: 37 mg/dL — ABNORMAL HIGH (ref 8–23)
CO2: 27 mmol/L (ref 22–32)
Calcium: 9.8 mg/dL (ref 8.9–10.3)
Chloride: 99 mmol/L (ref 98–111)
Creatinine, Ser: 4.29 mg/dL — ABNORMAL HIGH (ref 0.61–1.24)
GFR, Estimated: 15 mL/min — ABNORMAL LOW (ref >60)
Glucose, Bld: 146 mg/dL — ABNORMAL HIGH (ref 70–99)
Potassium: 3.4 mmol/L — ABNORMAL LOW (ref 3.5–5.1)
Sodium: 139 mmol/L (ref 135–145)
Total Bilirubin: 0.7 mg/dL (ref 0.3–1.2)
Total Protein: 7.5 g/dL (ref 6.5–8.1)

## 2022-08-19 MED ORDER — SENNA 8.6 MG PO TABS: 2.0000 | ORAL_TABLET | Freq: Every day | ORAL | 2 refills | Status: DC

## 2022-08-19 NOTE — Patient Instructions (Addendum)
[place] at [facility] Discharge Instructions   You were seen and examined today by Dr. Delton Coombes.  He reviewed the results of your lab work which are normal/stable.   We will hold your treatment until after your eye surgery.   We will see you back about 4 weeks after your surgery to resume treatment.    Thank you for choosing [place] at [facility] to provide your oncology and hematology care.  To afford each patient quality time with our provider, please arrive at least 15 minutes before your scheduled appointment time.   If you have a lab appointment with the [facility] please come in thru the Main Entrance and check in at the main information desk.  You need to re-schedule your appointment should you arrive 10 or more minutes late.  We strive to give you quality time with our providers, and arriving late affects you and other patients whose appointments are after yours.  Also, if you no show three or more times for appointments you may be dismissed from the clinic at the providers discretion.     Again, thank you for choosing [facility].  Our hope is that these requests will decrease the amount of time that you wait before being seen by our physicians.       _____________________________________________________________  Should you have questions after your visit to [facility], please contact our office at [phone] and follow the prompts.  Our office hours are 8:00 a.m. and 4:30 p.m. Monday - Friday.  Please note that voicemails left after 4:00 p.m. may not be returned until the following business day.  We are closed weekends and major holidays.  You do have access to a nurse 24-7, just call the main number to the clinic [phone] and do not press any options, hold on the line and a nurse will answer the phone.    For prescription refill requests, have your pharmacy contact our office and allow 72  hours.    Due to Covid, you will need to wear a mask upon entering the hospital. If you do not have a mask, a mask will be given to you at the Main Entrance upon arrival. For doctor visits, patients may have 1 support person age 63 or older with them. For treatment visits, patients can not have anyone with them due to social distancing guidelines and our immunocompromised population.

## 2022-08-19 NOTE — Progress Notes (Signed)
Ernest Barnett, [name]   CLINIC:  Medical Oncology/Hematology  PCP:  Lindell Spar, MD [address] / [place] [address] [phone]   REASON FOR VISIT:  Follow-up for multiple myeloma  PRIOR THERAPY: CyBorD  NGS Results: not done  CURRENT THERAPY: Daratumumab SQ + Bortezomib + Dexamethasone (DaraVd) q21d / Daratumumab SQ q28d   BRIEF ONCOLOGIC HISTORY:  Oncology History  Multiple myeloma without remission ([place])  03/06/2021 Initial Diagnosis   Multiple myeloma ([place])   03/06/2021 Cancer Staging   Staging form: Plasma Cell Myeloma and Plasma Cell Disorders, AJCC 8th Edition - Clinical stage from 03/06/2021: RISS Stage II (Beta-2-microglobulin (mg/L): 5.5, Albumin (g/dL): 3.5, ISS: Stage III, High-risk cytogenetics: Absent, LDH: Normal) - Signed by Derek Jack, MD on 03/06/2021 Stage prefix: Initial diagnosis Beta 2 microglobulin range (mg/L): Greater than or equal to 5.5 Albumin range (g/dL): Greater than or equal to 3.5 Cytogenetics: No abnormalities Pretreatment monoclonal protein level in serum (M spike) (g/dL): 2 Pretreatment serum free kappa light chain level (g/L): 794 Pretreatment serum free lambda light chain level (g/L): 15.9   03/14/2021 - 07/04/2021 Chemotherapy   Patient is on Treatment Plan : MULTIPLE MYELOMA CyBorD - Weekly Bortezomib     08/30/2021 -  Chemotherapy   Patient is on Treatment Plan : MYELOMA RELAPSED / REFRACTORY Daratumumab SQ + Bortezomib + Dexamethasone (DaraVd) q21d / Daratumumab SQ q28d        CANCER STAGING:  Cancer Staging  Multiple myeloma without remission (HCC) Staging form: Plasma Cell Myeloma and Plasma Cell Disorders, AJCC 8th Edition - Clinical stage from 03/06/2021: RISS Stage II (Beta-2-microglobulin (mg/L): 5.5, Albumin (g/dL): 3.5, ISS: Stage III, High-risk cytogenetics: Absent, LDH: Normal) - Signed by Derek Jack, MD on 03/06/2021   INTERVAL HISTORY:  [name], a 66 y.o. male, seen for follow-up of multiple myeloma.  He was being evaluated for enucleation later this month.  He reports some pain in the right eye as well as right headache on the vertex.  Energy levels are 25%.  Reports some shortness of breath on exertion.  REVIEW OF SYSTEMS:  Review of Systems  Constitutional:  Negative for appetite change and unexpected weight change.  Respiratory:  Positive for cough and shortness of breath.   Neurological:  Positive for dizziness. Negative for numbness.  All other systems reviewed and are negative.   PAST MEDICAL/SURGICAL HISTORY:  Past Medical History:  Diagnosis Date   Chronic kidney disease    COPD (chronic obstructive pulmonary disease) (HCC)    CVA (cerebral vascular accident) ([place]) 2013   Cystoid macular edema of left eye 03/07/2021   GERD (gastroesophageal reflux disease)    HOH (hard of hearing)    Hypercholesteremia    Hypertension    Left epiretinal membrane 03/07/2021   Pseudophakia 03/07/2021   Stroke ([place]) 03/24/2012   left sided weakness   Vitamin D deficiency    Past Surgical History:  Procedure Laterality Date   A/V FISTULAGRAM Right 01/29/2022   Procedure: A/V Fistulagram;  Surgeon: Serafina Mitchell, MD;  Location: [facility];  Service: Cardiovascular;  Laterality: Right;   AV FISTULA PLACEMENT Right 02/08/2021   Procedure: RIGHT ARM ARTERIOVENOUS (AV) FISTULA CREATION;  Surgeon: [name], MD;  Location: [facility];  Service: Vascular;  Laterality: Right;   BIOPSY  11/12/2018   Procedure: BIOPSY;  Surgeon: Rogene Houston, MD;  Location: [facility];  Service: Endoscopy;;  colon   BIOPSY  05/10/2020   Procedure: BIOPSY;  Surgeon: Rogene Houston, MD;  [facility];  Service: Endoscopy;;  esophagus   CATARACT EXTRACTION     right eye   CATARACT EXTRACTION [facility]  10/08/2012   Procedure: CATARACT EXTRACTION PHACO AND INTRAOCULAR LENS PLACEMENT ([employer]);  Surgeon: Tonny Branch, MD;  Location: [facility]  ORS;  Service: Ophthalmology;  Laterality: Left;  CDE:6.64   CHOLECYSTECTOMY     [place]   COLONOSCOPY N/A 11/12/2018   Procedure: COLONOSCOPY;  Surgeon: Rogene Houston, MD;  Location: [facility];  Service: Endoscopy;  Laterality: N/A;  1030   ELBOW FRACTURE SURGERY     left   ESOPHAGEAL DILATION N/A 11/12/2018   Procedure: ESOPHAGEAL DILATION;  Surgeon: Rogene Houston, MD;  [facility];  Service: Endoscopy;  Laterality: N/A;   ESOPHAGEAL DILATION N/A 05/10/2020   Procedure: ESOPHAGEAL DILATION;  Surgeon: Rogene Houston, MD;  [facility];  Service: Endoscopy;  Laterality: N/A;   ESOPHAGOGASTRODUODENOSCOPY N/A 11/12/2018   Procedure: ESOPHAGOGASTRODUODENOSCOPY (EGD);  Surgeon: Rogene Houston, MD;  [facility];  Service: Endoscopy;  Laterality: N/A;   ESOPHAGOGASTRODUODENOSCOPY N/A 05/10/2020   Procedure: ESOPHAGOGASTRODUODENOSCOPY (EGD);  Surgeon: Rogene Houston, MD;  [facility];  Service: Endoscopy;  Laterality: N/A;  210   HERNIA REPAIR     right inguinal   HYDROCELE EXCISION / REPAIR     IR FLUORO GUIDE CV LINE LEFT  08/17/2021   IR US GUIDE VASC ACCESS LEFT  08/17/2021   LIGATION OF COMPETING BRANCHES OF ARTERIOVENOUS FISTULA Right 09/11/2021   Procedure: LIGATION OF COMPETING BRANCHES OF RIGHT ARM ARTERIOVENOUS FISTULA;  Surgeon: [name], MD;  Location: [facility];  Service: Vascular;  Laterality: Right;   PERIPHERAL VASCULAR BALLOON ANGIOPLASTY Right 01/29/2022   Procedure: PERIPHERAL VASCULAR BALLOON ANGIOPLASTY;  Surgeon: Serafina Mitchell, MD;  Location: [facility];  Service: Cardiovascular;  Laterality: Right;  arm fistula   POLYPECTOMY  11/12/2018   Procedure: POLYPECTOMY;  Surgeon: Rogene Houston, MD;  [facility];  Service: Endoscopy;;  colon    RETINAL DETACHMENT SURGERY Left 2019   SPLENECTOMY, TOTAL     TONSILLECTOMY     VOCAL CORD INJECTION     removal of polyp-2005    SOCIAL HISTORY:  Social History    Socioeconomic History   Marital status: Significant Other    Spouse name: Not on file   Number of children: 2   Years of education: Not on file   Highest education level: Not on file  Occupational History   Occupation: Disability since having a stroke    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  Tobacco Use   Smoking status: Former    Packs/day: 3.00    Years: 30.00    Total pack years: 90.00    Types: Cigarettes   Smokeless tobacco: Current   Tobacco comments:    Currently vape  Vaping Use   Vaping Use: Every day   Substances: Nicotine  Substance and Sexual Activity   Alcohol use: Not Currently    Alcohol/week: 1.0 - 2.0 standard drink of alcohol    Types: 1 - 2 Cans of beer per week    Comment: Occasionally   Drug use: No   Sexual activity: Not Currently    Birth control/protection: Abstinence, None  Other Topics Concern   Not on file  Social History Narrative   2 children, both nearby  Social Determinants of Health   Financial Resource Strain: Low Risk  (05/30/2022)   Overall Financial Resource Strain (CARDIA)    Difficulty of Paying Living Expenses: Not hard at all  Food Insecurity: No Food Insecurity (05/30/2022)   Hunger Vital Sign    Worried About Running Out of Food in the Last Year: Never true    Ran Out of Food in the Last Year: Never true  Transportation Needs: No Transportation Needs (05/30/2022)   PRAPARE - Hydrologist (Medical): No    Lack of Transportation (Non-Medical): No  Physical Activity: Unknown (05/30/2022)   Exercise Vital Sign    Days of Exercise per Week: Not on file    Minutes of Exercise per Session: 0 min  Stress: No Stress Concern Present (05/30/2022)   [place]    Feeling of Stress : Not at all  Social Connections: Socially Isolated (05/30/2022)   Social Connection and Isolation Panel [NHANES]    Frequency of Communication with Friends and Family:  More than three times a week    Frequency of Social Gatherings with Friends and Family: More than three times a week    Attends Religious Services: Never    Marine scientist or Organizations: No    Attends Archivist Meetings: Never    Marital Status: Divorced  Human resources officer Violence: Not At Risk (05/30/2022)   Humiliation, Afraid, Rape, and Kick questionnaire    Fear of Current or Ex-Partner: No    Emotionally Abused: No    Physically Abused: No    Sexually Abused: No    FAMILY HISTORY:  No family history on file.  CURRENT MEDICATIONS:  Current Outpatient Medications  Medication Sig Dispense Refill   albuterol (VENTOLIN HFA) 108 (90 Base) MCG/ACT inhaler Inhale 2 puffs into the lungs every 6 (six) hours as needed for wheezing or shortness of breath. 8 g 5   aspirin EC 81 MG tablet Take 1 tablet (81 mg total) by mouth daily. 30 tablet 11   bortezomib IV (VELCADE) 3.5 MG injection Inject 1.5 mg/m2 into the vein once a week.     budesonide-formoterol (SYMBICORT) 80-4.5 MCG/ACT inhaler Inhale 2 puffs into the lungs 2 (two) times daily as needed (shortness of breath). 10.2 each 5   dexamethasone (DECADRON) 4 MG tablet Take 2.5 tablets (10 mg total) by mouth once a week. Take at home along with Tylenol and Benadryl weekly on the day of daratumumab injection. 30 tablet 2   docusate sodium (COLACE) 100 MG capsule Take 100 mg by mouth daily.     gabapentin (NEURONTIN) 300 MG capsule Take 1 capsule by mouth twice daily 60 capsule 6   Multiple Vitamins-Minerals (MULTIVITAMIN MEN 50+ PO) Take 1 tablet by mouth every morning.     multivitamin (RENA-VIT) TABS tablet Take 1 tablet by mouth daily.     Omega-3 Fatty Acids (FISH OIL) 1000 MG CAPS Take 1,000 mg by mouth in the morning, at noon, and at bedtime.     oxyCODONE (OXY IR/ROXICODONE) 5 MG immediate release tablet Take 1 tablet (5 mg total) by mouth 2 (two) times daily as needed for severe pain. 30 tablet 0   pantoprazole  (PROTONIX) 40 MG tablet Take 1 tablet (40 mg total) by mouth daily. 90 tablet 3   polyethylene glycol (MIRALAX / GLYCOLAX) packet Take 17 g by mouth daily. Patient states that he takes as needed. 30 each 5  pravastatin (PRAVACHOL) 80 MG tablet Take 1 tablet (80 mg total) by mouth daily. 90 tablet 3   prednisoLONE acetate (PRED FORTE) 1 % ophthalmic suspension Administer 1 drop into the left eye four (4) times a day for 7 days, THEN 1 drop Three (3) times a day before meals for 7 days, THEN 1 drop Two (2) times a day for 7 days, THEN 1 drop daily for 7 days.     tamsulosin (FLOMAX) 0.4 MG CAPS capsule Take 1 capsule by mouth once daily 30 capsule 3   tiZANidine (ZANAFLEX) 2 MG tablet TAKE 1 TABLET BY MOUTH EVERY 6 HOURS AS NEEDED FOR MUSCLE SPASM 30 tablet 0   torsemide (DEMADEX) 100 MG tablet Take 100 mg by mouth 2 (two) times daily.     atropine 1 % ophthalmic solution Place 1 drop into the right eye 4 (four) times daily. (Patient not taking: Reported on 08/19/2022) 2 mL 12   brinzolamide (AZOPT) 1 % ophthalmic suspension Place 1 drop into the right eye 3 (three) times daily. (Patient not taking: Reported on 08/19/2022)     ceFEPIme 1 g in sodium chloride 0.9 % 100 mL Inject 1 g into the vein daily. (Patient not taking: Reported on 08/19/2022)     midodrine (PROAMATINE) 10 MG tablet Take by mouth. (Patient not taking: Reported on 08/19/2022)     ranolazine (RANEXA) 500 MG 12 hr tablet Take 500 mg by mouth daily. (Patient not taking: Reported on 08/19/2022)     sildenafil (REVATIO) 20 MG tablet Take 1 tablet (20 mg total) by mouth 5 (five) times daily as needed. (Patient not taking: Reported on 08/19/2022) 30 tablet 2   No current facility-administered medications for this visit.   Facility-Administered Medications Ordered in Other Visits  Medication Dose Route Frequency Provider Last Rate Last Admin   palonosetron (ALOXI) 0.25 MG/5ML injection             ALLERGIES:  No Known  Allergies  PHYSICAL EXAM:  Performance status (ECOG): 1 - Symptomatic but completely ambulatory  Vitals:   08/19/22 0929  BP: 98/67  Pulse: (!) 114  Resp: 19  Temp: (!) 97.5 F (36.4 C)  SpO2: 96%    Wt Readings from Last 3 Encounters:  08/19/22 239 lb 8 oz (108.6 kg)  08/02/22 250 lb 12.8 oz (113.8 kg)  07/09/22 254 lb (115.2 kg)   Physical Exam Vitals reviewed.  Constitutional:      Appearance: Normal appearance.  Cardiovascular:     Rate and Rhythm: Normal rate and regular rhythm.     Pulses: Normal pulses.     Heart sounds: Normal heart sounds.  Pulmonary:     Effort: Pulmonary effort is normal.     Breath sounds: Normal breath sounds.  Neurological:     General: No focal deficit present.     Mental Status: He is alert and oriented to person, place, and time.  Psychiatric:        Mood and Affect: Mood normal.        Behavior: Behavior normal.    LABORATORY DATA:  I have reviewed the labs as listed.     Latest Ref Rng & Units 08/01/2022    8:57 AM 07/25/2022    8:38 AM 07/10/2022    4:20 AM  CBC  WBC 4.0 - 10.5 K/uL 4.6  5.5  4.6   Hemoglobin 13.0 - 17.0 g/dL 11.2  12.3  11.8   Hematocrit 39.0 - 52.0 % 32.8  36.4  35.3   Platelets 150 - 400 K/uL 113  148  91       Latest Ref Rng & Units 08/19/2022    8:55 AM 07/25/2022    8:38 AM 07/10/2022    4:20 AM  CMP  Glucose 70 - 99 mg/dL 146  132  118   BUN 8 - 23 mg/dL 37  48  32   Creatinine 0.61 - 1.24 mg/dL 4.29  3.67  3.50   Sodium 135 - 145 mmol/L 139  135  137   Potassium 3.5 - 5.1 mmol/L 3.4  3.5  3.4   Chloride 98 - 111 mmol/L 99  101  109   CO2 22 - 32 mmol/L '27  22  17   ' Calcium 8.9 - 10.3 mg/dL 9.8  8.6  9.3   Total Protein 6.5 - 8.1 g/dL 7.5  6.5    Total Bilirubin 0.3 - 1.2 mg/dL 0.7  0.7    Alkaline Phos 38 - 126 U/L 62  53    AST 15 - 41 U/L 27  28    ALT 0 - 44 U/L 30  54      DIAGNOSTIC IMAGING:  I have independently reviewed the scans and discussed with the patient. No results found.    ASSESSMENT:  1.  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  Basis for CKD is hypertension associated arteriosclerosis with arterionephrosclerosis. - Bone marrow biopsy on 02/21/2021 with slightly hypercellular bone marrow with trilineage hematopoiesis.  Plasma cells increased in number representing 11% of all cells with kappa light chain restriction. - Myeloma FISH panel with no evidence of T p53.  Plasma cell enrichment yielded limited cellularity, therefore only tested for T p53 probes at. - Chromosome analysis 45, X,- Y (8)/46, XY (12) - Labs on 02/01/2021-M spike 2 g, kappa light chain 794, free light chain ratio 49.97.  Beta-2 microglobulin 5.5, albumin 3.5.  LDH normal. - PET scan on 03/08/2021 with no evidence of active myeloma.  No soft tissue plasmacytoma. - 24-hour urine total protein 2.2 g on 03/13/2021.  Immunofixation positive for IgG kappa. - 4 cycles of CyBorD from 03/14/2021 through 07/04/2021 held due to fluid retention.  Myeloma labs on 08/08/2021 showed progression. - Daratumumab, Velcade and dexamethasone started on 08/30/2021.   2.  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  Smoked 2 packs/day for 32 years and quit smoking cigarettes and vaping now. -Mother had small cell lung cancer.   3.  CKD: -He has stage V CKD thought to be secondary to hypertension. -Renal ultrasound on 12/19/2020 was negative for obstructive uropathy and findings were consistent with chronic medical renal disease.  Bilateral kidney cysts were seen. -He has 2.7 g of proteinuria on 24-hour urine. -Kidney biopsy was done on 01/25/2021.  4.  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  I have reviewed all records  from [facility]. - Was thought to be possibly due to ischemic event.  They have recommended hypercoagulable work-up. - I have reviewed hypercoagulable labs including protein C, protein S, Antithrombin III, factor V Leiden, prothrombin gene mutations which were negative.  He is currently on steroids. - He was found to have elevated inflammatory tumor markers including ESR, CRP, C3 and C4. -  His multiple myeloma is under control and unlikely etiology for this event.   PLAN:  1.  Stage II, standard risk IgG kappa multiple myeloma: - Last treatment with Velcade was on 06/27/2022. - Last M spike was 0.2 g on 06/27/2022 and stable. - Reviewed labs today which showed normal LFTs.  CBC was grossly normal with mild thrombocytopenia and anemia. - He is having left eye surgery on 08/28/2022.  I have reviewed records from [facility]. - We will hold his treatment and he will return back in 4 to 6 weeks.  We will obtain myeloma labs 1 week prior and likely restart therapy at that time.   2.  Smoking history: - Last chest CT on 02/15/2021 was lung RADS 1.   3.  ID prophylaxis: - Can continue aspirin 81 mg daily and acyclovir twice daily.   6.  Lower extremity swelling: - Continue furosemide twice daily.  7.  ESRD on HD: - Peritoneal dialysis started on 05/15/2022 and going well.  8.  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 - We will hold his treatments until after surgery.   Orders placed this encounter:  No orders of the defined types were placed in this encounter.    Derek Jack, MD [place] [phone]

## 2022-08-19 NOTE — Progress Notes (Signed)
Per Dr. Delton Coombes no treatment today d/t upcoming surgery, patient will possibly resume treatment after thanksgiving.

## 2022-08-20 DIAGNOSIS — Z992 Dependence on renal dialysis: Secondary | ICD-10-CM | POA: Diagnosis not present

## 2022-08-20 DIAGNOSIS — N186 End stage renal disease: Secondary | ICD-10-CM | POA: Diagnosis not present

## 2022-08-20 LAB — KAPPA/LAMBDA LIGHT CHAINS
Kappa free light chain: 47.4 mg/L — ABNORMAL HIGH (ref 3.3–19.4)
Kappa, lambda light chain ratio: 3.82 — ABNORMAL HIGH (ref 0.26–1.65)
Lambda free light chains: 12.4 mg/L (ref 5.7–26.3)

## 2022-08-21 DIAGNOSIS — N186 End stage renal disease: Secondary | ICD-10-CM | POA: Diagnosis not present

## 2022-08-21 DIAGNOSIS — Z992 Dependence on renal dialysis: Secondary | ICD-10-CM | POA: Diagnosis not present

## 2022-08-21 LAB — PROTEIN ELECTROPHORESIS, SERUM
A/G Ratio: 1.1 (ref 0.7–1.7)
Albumin ELP: 3.5 g/dL (ref 2.9–4.4)
Alpha-1-Globulin: 0.3 g/dL (ref 0.0–0.4)
Alpha-2-Globulin: 1.3 g/dL — ABNORMAL HIGH (ref 0.4–1.0)
Beta Globulin: 0.9 g/dL (ref 0.7–1.3)
Gamma Globulin: 0.6 g/dL (ref 0.4–1.8)
Globulin, Total: 3.1 g/dL (ref 2.2–3.9)
M-Spike, %: 0.2 g/dL — ABNORMAL HIGH
Total Protein ELP: 6.6 g/dL (ref 6.0–8.5)

## 2022-08-22 ENCOUNTER — Inpatient Hospital Stay: Payer: Medicare HMO

## 2022-08-22 DIAGNOSIS — Z992 Dependence on renal dialysis: Secondary | ICD-10-CM | POA: Diagnosis not present

## 2022-08-22 DIAGNOSIS — N186 End stage renal disease: Secondary | ICD-10-CM | POA: Diagnosis not present

## 2022-08-23 DIAGNOSIS — Z992 Dependence on renal dialysis: Secondary | ICD-10-CM | POA: Diagnosis not present

## 2022-08-23 DIAGNOSIS — N186 End stage renal disease: Secondary | ICD-10-CM | POA: Diagnosis not present

## 2022-08-26 DIAGNOSIS — N186 End stage renal disease: Secondary | ICD-10-CM | POA: Diagnosis not present

## 2022-08-26 DIAGNOSIS — Z992 Dependence on renal dialysis: Secondary | ICD-10-CM | POA: Diagnosis not present

## 2022-08-27 DIAGNOSIS — H15031 Posterior scleritis, right eye: Secondary | ICD-10-CM | POA: Diagnosis not present

## 2022-08-27 DIAGNOSIS — Z992 Dependence on renal dialysis: Secondary | ICD-10-CM | POA: Diagnosis not present

## 2022-08-27 DIAGNOSIS — N186 End stage renal disease: Secondary | ICD-10-CM | POA: Diagnosis not present

## 2022-08-27 LAB — MISC LABCORP TEST (SEND OUT): Labcorp test code: 123218

## 2022-08-28 DIAGNOSIS — N186 End stage renal disease: Secondary | ICD-10-CM | POA: Diagnosis not present

## 2022-08-28 DIAGNOSIS — Z6838 Body mass index (BMI) 38.0-38.9, adult: Secondary | ICD-10-CM | POA: Diagnosis not present

## 2022-08-28 DIAGNOSIS — F17210 Nicotine dependence, cigarettes, uncomplicated: Secondary | ICD-10-CM | POA: Diagnosis not present

## 2022-08-28 DIAGNOSIS — Z7982 Long term (current) use of aspirin: Secondary | ICD-10-CM | POA: Diagnosis not present

## 2022-08-28 DIAGNOSIS — H5711 Ocular pain, right eye: Secondary | ICD-10-CM | POA: Diagnosis not present

## 2022-08-28 DIAGNOSIS — R69 Illness, unspecified: Secondary | ICD-10-CM | POA: Diagnosis not present

## 2022-08-28 DIAGNOSIS — I120 Hypertensive chronic kidney disease with stage 5 chronic kidney disease or end stage renal disease: Secondary | ICD-10-CM | POA: Diagnosis not present

## 2022-08-28 DIAGNOSIS — Z992 Dependence on renal dialysis: Secondary | ICD-10-CM | POA: Diagnosis not present

## 2022-08-28 DIAGNOSIS — H5440 Blindness, one eye, unspecified eye: Secondary | ICD-10-CM | POA: Diagnosis not present

## 2022-08-29 DIAGNOSIS — N186 End stage renal disease: Secondary | ICD-10-CM | POA: Diagnosis not present

## 2022-08-29 DIAGNOSIS — Z992 Dependence on renal dialysis: Secondary | ICD-10-CM | POA: Diagnosis not present

## 2022-08-30 DIAGNOSIS — N186 End stage renal disease: Secondary | ICD-10-CM | POA: Diagnosis not present

## 2022-08-30 DIAGNOSIS — Z992 Dependence on renal dialysis: Secondary | ICD-10-CM | POA: Diagnosis not present

## 2022-09-02 DIAGNOSIS — Z992 Dependence on renal dialysis: Secondary | ICD-10-CM | POA: Diagnosis not present

## 2022-09-02 DIAGNOSIS — N186 End stage renal disease: Secondary | ICD-10-CM | POA: Diagnosis not present

## 2022-09-03 DIAGNOSIS — N186 End stage renal disease: Secondary | ICD-10-CM | POA: Diagnosis not present

## 2022-09-03 DIAGNOSIS — Z992 Dependence on renal dialysis: Secondary | ICD-10-CM | POA: Diagnosis not present

## 2022-09-04 DIAGNOSIS — N186 End stage renal disease: Secondary | ICD-10-CM | POA: Diagnosis not present

## 2022-09-04 DIAGNOSIS — Z992 Dependence on renal dialysis: Secondary | ICD-10-CM | POA: Diagnosis not present

## 2022-09-05 ENCOUNTER — Telehealth: Payer: Self-pay | Admitting: Dietician

## 2022-09-05 ENCOUNTER — Inpatient Hospital Stay: Payer: Medicare HMO

## 2022-09-05 ENCOUNTER — Encounter: Payer: Medicare HMO | Admitting: Dietician

## 2022-09-05 ENCOUNTER — Inpatient Hospital Stay: Payer: Medicare HMO | Admitting: Hematology

## 2022-09-05 DIAGNOSIS — N186 End stage renal disease: Secondary | ICD-10-CM | POA: Diagnosis not present

## 2022-09-05 DIAGNOSIS — Z992 Dependence on renal dialysis: Secondary | ICD-10-CM | POA: Diagnosis not present

## 2022-09-05 NOTE — Telephone Encounter (Signed)
Nutrition Assessment   Reason for Assessment: + MST (wt loss)   ASSESSMENT: 66 year old male with multiple myeloma. He is receiving DaraVd q21d + Daratumumab q28d (started 08/25/21). Patient is under the care of Dr. Delton Coombes. Treatment currently on hold secondary to surgery.   Noted left eye enucleation at [facility] on 10/25  Spoke with patient via telephone. Introduced self and services available at [facility]. Patient appreciative of call and agreeable to telephone visit today. Patient reports having a good appetite and eating well. States that eating too much has always been his problem. He endorses intentional weight loss with recent dietary changes. Patient has stopped eating salt and processed foods. He reports his wife has been reading nutrition facts label. Patient is drinking more water. He is drinking some juice and says he has significantly cut back on soda intake. Patient is working to increase physical activity with walking. He says the weight has been falling off s/p lifestyle choices and he feels so much better. Patient denies nutrition impact symptoms.    Anthropometrics: Wt decreased 4.4% (11 lbs) from 250 lb 12.8 oz on 9/29 - severe for time frame  Height: 5'6.5" Weight: 239 lb 8 oz (10/16) UBW: 267 lb (per pt at his heaviest) BMI: 38.08   NUTRITION DIAGNOSIS: No nutrition diagnosis at this time. Pt has intentional weight loss with increased activity and implementation of healthy dietary choices    Next Visit: No follow-up warranted at this time. Pt provided contact information and encouraged to call with nutrition questions/concerns.

## 2022-09-06 DIAGNOSIS — N186 End stage renal disease: Secondary | ICD-10-CM | POA: Diagnosis not present

## 2022-09-06 DIAGNOSIS — Z992 Dependence on renal dialysis: Secondary | ICD-10-CM | POA: Diagnosis not present

## 2022-09-09 DIAGNOSIS — Z992 Dependence on renal dialysis: Secondary | ICD-10-CM | POA: Diagnosis not present

## 2022-09-09 DIAGNOSIS — N186 End stage renal disease: Secondary | ICD-10-CM | POA: Diagnosis not present

## 2022-09-10 ENCOUNTER — Encounter: Payer: Self-pay | Admitting: *Deleted

## 2022-09-10 ENCOUNTER — Other Ambulatory Visit: Payer: Self-pay | Admitting: *Deleted

## 2022-09-10 DIAGNOSIS — Z992 Dependence on renal dialysis: Secondary | ICD-10-CM | POA: Diagnosis not present

## 2022-09-10 DIAGNOSIS — N186 End stage renal disease: Secondary | ICD-10-CM | POA: Diagnosis not present

## 2022-09-10 DIAGNOSIS — I639 Cerebral infarction, unspecified: Secondary | ICD-10-CM

## 2022-09-11 DIAGNOSIS — Z992 Dependence on renal dialysis: Secondary | ICD-10-CM | POA: Diagnosis not present

## 2022-09-11 DIAGNOSIS — N186 End stage renal disease: Secondary | ICD-10-CM | POA: Diagnosis not present

## 2022-09-12 ENCOUNTER — Encounter: Payer: Self-pay | Admitting: *Deleted

## 2022-09-12 DIAGNOSIS — N186 End stage renal disease: Secondary | ICD-10-CM | POA: Diagnosis not present

## 2022-09-12 DIAGNOSIS — Z992 Dependence on renal dialysis: Secondary | ICD-10-CM | POA: Diagnosis not present

## 2022-09-13 DIAGNOSIS — N186 End stage renal disease: Secondary | ICD-10-CM | POA: Diagnosis not present

## 2022-09-13 DIAGNOSIS — Z992 Dependence on renal dialysis: Secondary | ICD-10-CM | POA: Diagnosis not present

## 2022-09-16 DIAGNOSIS — Z992 Dependence on renal dialysis: Secondary | ICD-10-CM | POA: Diagnosis not present

## 2022-09-16 DIAGNOSIS — N186 End stage renal disease: Secondary | ICD-10-CM | POA: Diagnosis not present

## 2022-09-17 DIAGNOSIS — Z992 Dependence on renal dialysis: Secondary | ICD-10-CM | POA: Diagnosis not present

## 2022-09-17 DIAGNOSIS — N186 End stage renal disease: Secondary | ICD-10-CM | POA: Diagnosis not present

## 2022-09-18 DIAGNOSIS — N186 End stage renal disease: Secondary | ICD-10-CM | POA: Diagnosis not present

## 2022-09-18 DIAGNOSIS — Z992 Dependence on renal dialysis: Secondary | ICD-10-CM | POA: Diagnosis not present

## 2022-09-19 DIAGNOSIS — Z992 Dependence on renal dialysis: Secondary | ICD-10-CM | POA: Diagnosis not present

## 2022-09-19 DIAGNOSIS — N186 End stage renal disease: Secondary | ICD-10-CM | POA: Diagnosis not present

## 2022-09-20 DIAGNOSIS — N186 End stage renal disease: Secondary | ICD-10-CM | POA: Diagnosis not present

## 2022-09-20 DIAGNOSIS — Z992 Dependence on renal dialysis: Secondary | ICD-10-CM | POA: Diagnosis not present

## 2022-09-23 DIAGNOSIS — N186 End stage renal disease: Secondary | ICD-10-CM | POA: Diagnosis not present

## 2022-09-23 DIAGNOSIS — Z992 Dependence on renal dialysis: Secondary | ICD-10-CM | POA: Diagnosis not present

## 2022-09-24 ENCOUNTER — Other Ambulatory Visit (HOSPITAL_COMMUNITY): Admission: RE | Admit: 2022-09-24 | Payer: Medicare HMO | Source: Home / Self Care

## 2022-09-24 ENCOUNTER — Other Ambulatory Visit (HOSPITAL_COMMUNITY)
Admission: RE | Admit: 2022-09-24 | Discharge: 2022-09-24 | Disposition: A | Discharge status: Home / Self Care | Payer: Medicare HMO | Source: Ambulatory Visit | Attending: Hematology | Admitting: Hematology

## 2022-09-24 ENCOUNTER — Inpatient Hospital Stay: Payer: Medicare HMO | Attending: Hematology | Admitting: Hematology

## 2022-09-24 ENCOUNTER — Other Ambulatory Visit: Payer: Self-pay | Admitting: *Deleted

## 2022-09-24 DIAGNOSIS — E538 Deficiency of other specified B group vitamins: Secondary | ICD-10-CM | POA: Insufficient documentation

## 2022-09-24 DIAGNOSIS — D649 Anemia, unspecified: Secondary | ICD-10-CM | POA: Insufficient documentation

## 2022-09-24 DIAGNOSIS — Z992 Dependence on renal dialysis: Secondary | ICD-10-CM | POA: Diagnosis not present

## 2022-09-24 DIAGNOSIS — C9000 Multiple myeloma not having achieved remission: Secondary | ICD-10-CM

## 2022-09-24 DIAGNOSIS — N185 Chronic kidney disease, stage 5: Secondary | ICD-10-CM | POA: Insufficient documentation

## 2022-09-24 DIAGNOSIS — N186 End stage renal disease: Secondary | ICD-10-CM | POA: Diagnosis not present

## 2022-09-24 LAB — COMPREHENSIVE METABOLIC PANEL
ALT: 18 U/L (ref 0–44)
AST: 17 U/L (ref 15–41)
Albumin: 3.2 g/dL — ABNORMAL LOW (ref 3.5–5.0)
Alkaline Phosphatase: 44 U/L (ref 38–126)
Anion gap: 10 (ref 5–15)
BUN: 31 mg/dL — ABNORMAL HIGH (ref 8–23)
CO2: 23 mmol/L (ref 22–32)
Calcium: 8.4 mg/dL — ABNORMAL LOW (ref 8.9–10.3)
Chloride: 105 mmol/L (ref 98–111)
Creatinine, Ser: 4.6 mg/dL — ABNORMAL HIGH (ref 0.61–1.24)
GFR, Estimated: 13 mL/min — ABNORMAL LOW (ref >60)
Glucose, Bld: 168 mg/dL — ABNORMAL HIGH (ref 70–99)
Potassium: 2.6 mmol/L — CL (ref 3.5–5.1)
Sodium: 138 mmol/L (ref 135–145)
Total Bilirubin: 0.5 mg/dL (ref 0.3–1.2)
Total Protein: 6.6 g/dL (ref 6.5–8.1)

## 2022-09-24 LAB — CBC WITH DIFFERENTIAL/PLATELET
Abs Immature Granulocytes: 0.3 10*3/uL — ABNORMAL HIGH (ref 0.00–0.07)
Band Neutrophils: 1 %
Basophils Absolute: 0 10*3/uL (ref 0.0–0.1)
Basophils Relative: 1 %
Blasts: 1 %
Eosinophils Absolute: 0.3 10*3/uL (ref 0.0–0.5)
Eosinophils Relative: 6 %
HCT: 20.8 % — ABNORMAL LOW (ref 39.0–52.0)
Hemoglobin: 6.9 g/dL — CL (ref 13.0–17.0)
Lymphocytes Relative: 67 %
Lymphs Abs: 3.1 10*3/uL (ref 0.7–4.0)
MCH: 37.5 pg — ABNORMAL HIGH (ref 26.0–34.0)
MCHC: 33.2 g/dL (ref 30.0–36.0)
MCV: 113 fL — ABNORMAL HIGH (ref 80.0–100.0)
Metamyelocytes Relative: 1 %
Monocytes Absolute: 0.5 10*3/uL (ref 0.1–1.0)
Monocytes Relative: 10 %
Myelocytes: 5 %
Neutro Abs: 0.4 10*3/uL — CL (ref 1.7–7.7)
Neutrophils Relative %: 8 %
Platelets: 85 10*3/uL — ABNORMAL LOW (ref 150–400)
RBC: 1.84 MIL/uL — ABNORMAL LOW (ref 4.22–5.81)
RDW: 22.7 % — ABNORMAL HIGH (ref 11.5–15.5)
WBC: 4.6 10*3/uL (ref 4.0–10.5)
nRBC: 8.8 % — ABNORMAL HIGH (ref 0.0–0.2)

## 2022-09-24 LAB — PREPARE RBC (CROSSMATCH)

## 2022-09-24 LAB — MAGNESIUM: Magnesium: 1.6 mg/dL — ABNORMAL LOW (ref 1.7–2.4)

## 2022-09-24 MED ORDER — POTASSIUM CHLORIDE CRYS ER 20 MEQ PO TBCR: EXTENDED_RELEASE_TABLET | ORAL | 0 refills | Status: DC

## 2022-09-24 NOTE — Progress Notes (Signed)
CRITICAL VALUE ALERT Critical value received:  Potassium 2.6.  Date of notification:  09-24-2022 Time of notification: 09:30 am. Critical value read back:  Yes.   Nurse who received alert:  B. Mary-Anne Polizzi RN MD notified time and response:  Dr. Delton Coombes / A. Anderson RN @ 09:35 am.   CRITICAL VALUE ALERT Critical value received:  HGB 6.9.  Date of notification:  09-24-2022.  Time of notification: 10:08 am.  Critical value read back:  Yes.   Nurse who received alert:  B. Filippa Yarbough RN.  MD notified time and response:  Dr. Delton Coombes / A. Anderson RN @ 10:20 am. Patient called to return to the lab for blood bank type and screen for 1 unit of blood 09-24-2022.   CRITICAL VALUE ALERT Critical value received:  ANC 0.4. Date of notification:  09-24-2022 Time of notification: 10:23 am.  Critical value read back:  Yes.   Nurse who received alert:  B. Thurma Priego RN.  MD notified time and response:  Dr. Delton Coombes / A . Anderson RN at 10:24 am.

## 2022-09-25 ENCOUNTER — Inpatient Hospital Stay: Payer: Medicare HMO

## 2022-09-25 DIAGNOSIS — E538 Deficiency of other specified B group vitamins: Secondary | ICD-10-CM | POA: Diagnosis not present

## 2022-09-25 DIAGNOSIS — C9000 Multiple myeloma not having achieved remission: Secondary | ICD-10-CM

## 2022-09-25 DIAGNOSIS — Z992 Dependence on renal dialysis: Secondary | ICD-10-CM | POA: Diagnosis not present

## 2022-09-25 DIAGNOSIS — N186 End stage renal disease: Secondary | ICD-10-CM | POA: Diagnosis not present

## 2022-09-25 DIAGNOSIS — D649 Anemia, unspecified: Secondary | ICD-10-CM | POA: Diagnosis not present

## 2022-09-25 DIAGNOSIS — N185 Chronic kidney disease, stage 5: Secondary | ICD-10-CM | POA: Diagnosis not present

## 2022-09-25 LAB — KAPPA/LAMBDA LIGHT CHAINS
Kappa free light chain: 52.1 mg/L — ABNORMAL HIGH (ref 3.3–19.4)
Kappa, lambda light chain ratio: 2.34 — ABNORMAL HIGH (ref 0.26–1.65)
Lambda free light chains: 22.3 mg/L (ref 5.7–26.3)

## 2022-09-25 MED ORDER — ACETAMINOPHEN 325 MG PO TABS
650.0000 mg | ORAL_TABLET | Freq: Once | ORAL | Status: AC
  Administered 2022-09-25: 650 mg via ORAL
  Filled 2022-09-25: qty 2

## 2022-09-25 MED ORDER — DIPHENHYDRAMINE HCL 25 MG PO CAPS
25.0000 mg | ORAL_CAPSULE | Freq: Once | ORAL | Status: AC
  Administered 2022-09-25: 25 mg via ORAL
  Filled 2022-09-25: qty 1

## 2022-09-25 MED ORDER — SODIUM CHLORIDE 0.9% IV SOLUTION
250.0000 mL | Freq: Once | INTRAVENOUS | Status: AC
  Administered 2022-09-25: 250 mL via INTRAVENOUS

## 2022-09-25 NOTE — Patient Instructions (Signed)
[facility]  Discharge Instructions: Thank you for choosing [facility] to provide your oncology and hematology care.  If you have a lab appointment with the [facility], please come in thru the Main Entrance and check in at the main information desk.  Wear comfortable clothing and clothing appropriate for easy access to any Portacath or PICC line.   We strive to give you quality time with your provider. You may need to reschedule your appointment if you arrive late (15 or more minutes).  Arriving late affects you and other patients whose appointments are after yours.  Also, if you miss three or more appointments without notifying the office, you may be dismissed from the clinic at the provider's discretion.      For prescription refill requests, have your pharmacy contact our office and allow 72 hours for refills to be completed.    Two units of blood  BELOW ARE SYMPTOMS THAT SHOULD BE REPORTED IMMEDIATELY: *FEVER GREATER THAN 100.4 F (38 C) OR HIGHER *CHILLS OR SWEATING *NAUSEA AND VOMITING THAT IS NOT CONTROLLED WITH YOUR NAUSEA MEDICATION *UNUSUAL SHORTNESS OF BREATH *UNUSUAL BRUISING OR BLEEDING *URINARY PROBLEMS (pain or burning when urinating, or frequent urination) *BOWEL PROBLEMS (unusual diarrhea, constipation, pain near the anus) TENDERNESS IN MOUTH AND THROAT WITH OR WITHOUT PRESENCE OF ULCERS (sore throat, sores in mouth, or a toothache) UNUSUAL RASH, SWELLING OR PAIN  UNUSUAL VAGINAL DISCHARGE OR ITCHING   Items with * indicate a potential emergency and should be followed up as soon as possible or go to the Emergency Department if any problems should occur.  Please show the CHEMOTHERAPY ALERT CARD or IMMUNOTHERAPY ALERT CARD at check-in to the Emergency Department and triage nurse.  Should you have questions after your visit or need to cancel or reschedule your appointment, please contact [facility] [phone]   and follow the prompts.  Office hours are 8:00 a.m. to 4:30 p.m. Monday - Friday. Please note that voicemails left after 4:00 p.m. may not be returned until the following business day.  We are closed weekends and major holidays. You have access to a nurse at all times for urgent questions. Please call the main number to the clinic [phone] and follow the prompts.  For any non-urgent questions, you may also contact your provider using MyChart. We now offer e-Visits for anyone 6 and older to request care online for non-urgent symptoms. For details visit [email or web address].   Also download the MyChart app! Go to the app store, search "MyChart", open the app, select [facility], and log in with your MyChart username and password.  Masks are optional in the cancer centers. If you would like for your care team to wear a mask while they are taking care of you, please let them know. You may have one support person who is at least 65 years old accompany you for your appointments.

## 2022-09-25 NOTE — Progress Notes (Signed)
Pt presents today for 2 units of blood per provider's order. Vital signs stable and pt voice no new complaints at this time.  Peripheral IV started with good blood return pre and post infusion.  2 units of blood given today per MD orders. Tolerated infusion without adverse affects. Vital signs stable. No complaints at this time. Discharged from clinic ambulatory in stable condition. Alert and oriented x 3. F/U with [facility] as scheduled.

## 2022-09-26 DIAGNOSIS — Z992 Dependence on renal dialysis: Secondary | ICD-10-CM | POA: Diagnosis not present

## 2022-09-26 DIAGNOSIS — N186 End stage renal disease: Secondary | ICD-10-CM | POA: Diagnosis not present

## 2022-09-26 LAB — TYPE AND SCREEN
ABO/RH(D): A POS
Antibody Screen: NEGATIVE
Unit division: 0
Unit division: 0

## 2022-09-26 LAB — BPAM RBC
Blood Product Expiration Date: 202312162359
Blood Product Expiration Date: 202312162359
ISSUE DATE / TIME: 202311221107
ISSUE DATE / TIME: 202311221301
Unit Type and Rh: 6200
Unit Type and Rh: 6200

## 2022-09-27 ENCOUNTER — Ambulatory Visit: Payer: Medicare HMO | Attending: Cardiology

## 2022-09-27 DIAGNOSIS — I639 Cerebral infarction, unspecified: Secondary | ICD-10-CM | POA: Diagnosis not present

## 2022-09-27 DIAGNOSIS — Z992 Dependence on renal dialysis: Secondary | ICD-10-CM | POA: Diagnosis not present

## 2022-09-27 DIAGNOSIS — N186 End stage renal disease: Secondary | ICD-10-CM | POA: Diagnosis not present

## 2022-09-30 DIAGNOSIS — N186 End stage renal disease: Secondary | ICD-10-CM | POA: Diagnosis not present

## 2022-09-30 DIAGNOSIS — Z992 Dependence on renal dialysis: Secondary | ICD-10-CM | POA: Diagnosis not present

## 2022-09-30 LAB — PROTEIN ELECTROPHORESIS, SERUM
A/G Ratio: 1.1 (ref 0.7–1.7)
Albumin ELP: 3.1 g/dL (ref 2.9–4.4)
Alpha-1-Globulin: 0.3 g/dL (ref 0.0–0.4)
Alpha-2-Globulin: 1.1 g/dL — ABNORMAL HIGH (ref 0.4–1.0)
Beta Globulin: 0.8 g/dL (ref 0.7–1.3)
Gamma Globulin: 0.5 g/dL (ref 0.4–1.8)
Globulin, Total: 2.7 g/dL (ref 2.2–3.9)
M-Spike, %: 0.1 g/dL — ABNORMAL HIGH
Total Protein ELP: 5.8 g/dL — ABNORMAL LOW (ref 6.0–8.5)

## 2022-10-01 ENCOUNTER — Inpatient Hospital Stay: Payer: Medicare HMO

## 2022-10-01 ENCOUNTER — Other Ambulatory Visit: Payer: Self-pay

## 2022-10-01 ENCOUNTER — Inpatient Hospital Stay: Payer: Medicare HMO | Admitting: Hematology

## 2022-10-01 VITALS — BP 94/73 | HR 124 | Temp 98.6°F | Resp 20

## 2022-10-01 DIAGNOSIS — T451X5A Adverse effect of antineoplastic and immunosuppressive drugs, initial encounter: Secondary | ICD-10-CM | POA: Diagnosis not present

## 2022-10-01 DIAGNOSIS — C9000 Multiple myeloma not having achieved remission: Secondary | ICD-10-CM

## 2022-10-01 DIAGNOSIS — N186 End stage renal disease: Secondary | ICD-10-CM | POA: Diagnosis not present

## 2022-10-01 DIAGNOSIS — D649 Anemia, unspecified: Secondary | ICD-10-CM | POA: Diagnosis not present

## 2022-10-01 DIAGNOSIS — E538 Deficiency of other specified B group vitamins: Secondary | ICD-10-CM | POA: Diagnosis not present

## 2022-10-01 DIAGNOSIS — N185 Chronic kidney disease, stage 5: Secondary | ICD-10-CM | POA: Diagnosis not present

## 2022-10-01 DIAGNOSIS — D6481 Anemia due to antineoplastic chemotherapy: Secondary | ICD-10-CM | POA: Diagnosis not present

## 2022-10-01 DIAGNOSIS — Z992 Dependence on renal dialysis: Secondary | ICD-10-CM | POA: Diagnosis not present

## 2022-10-01 LAB — CBC WITH DIFFERENTIAL/PLATELET
Abs Immature Granulocytes: 0.1 10*3/uL — ABNORMAL HIGH (ref 0.00–0.07)
Basophils Absolute: 0 10*3/uL (ref 0.0–0.1)
Basophils Relative: 0 %
Eosinophils Absolute: 0.1 10*3/uL (ref 0.0–0.5)
Eosinophils Relative: 1 %
HCT: 26.7 % — ABNORMAL LOW (ref 39.0–52.0)
Hemoglobin: 8.8 g/dL — ABNORMAL LOW (ref 13.0–17.0)
Lymphocytes Relative: 89 %
Lymphs Abs: 5.3 10*3/uL — ABNORMAL HIGH (ref 0.7–4.0)
MCH: 35.6 pg — ABNORMAL HIGH (ref 26.0–34.0)
MCHC: 33 g/dL (ref 30.0–36.0)
MCV: 108.1 fL — ABNORMAL HIGH (ref 80.0–100.0)
Metamyelocytes Relative: 2 %
Monocytes Absolute: 0.1 10*3/uL (ref 0.1–1.0)
Monocytes Relative: 2 %
Neutro Abs: 0.4 10*3/uL — CL (ref 1.7–7.7)
Neutrophils Relative %: 6 %
Platelets: 59 10*3/uL — ABNORMAL LOW (ref 150–400)
RBC: 2.47 MIL/uL — ABNORMAL LOW (ref 4.22–5.81)
RDW: 23.9 % — ABNORMAL HIGH (ref 11.5–15.5)
WBC: 6 10*3/uL (ref 4.0–10.5)
nRBC: 8.2 % — ABNORMAL HIGH (ref 0.0–0.2)

## 2022-10-01 LAB — IRON AND TIBC
Iron: 205 ug/dL — ABNORMAL HIGH (ref 45–182)
Saturation Ratios: 65 % — ABNORMAL HIGH (ref 17.9–39.5)
TIBC: 314 ug/dL (ref 250–450)
UIBC: 109 ug/dL

## 2022-10-01 LAB — VITAMIN B12: Vitamin B-12: 120 pg/mL — ABNORMAL LOW (ref 180–914)

## 2022-10-01 LAB — COMPREHENSIVE METABOLIC PANEL
ALT: 19 U/L (ref 0–44)
AST: 18 U/L (ref 15–41)
Albumin: 3.3 g/dL — ABNORMAL LOW (ref 3.5–5.0)
Alkaline Phosphatase: 40 U/L (ref 38–126)
Anion gap: 9 (ref 5–15)
BUN: 39 mg/dL — ABNORMAL HIGH (ref 8–23)
CO2: 23 mmol/L (ref 22–32)
Calcium: 8.8 mg/dL — ABNORMAL LOW (ref 8.9–10.3)
Chloride: 107 mmol/L (ref 98–111)
Creatinine, Ser: 5.48 mg/dL — ABNORMAL HIGH (ref 0.61–1.24)
GFR, Estimated: 11 mL/min — ABNORMAL LOW (ref >60)
Glucose, Bld: 102 mg/dL — ABNORMAL HIGH (ref 70–99)
Potassium: 4 mmol/L (ref 3.5–5.1)
Sodium: 139 mmol/L (ref 135–145)
Total Bilirubin: 0.4 mg/dL (ref 0.3–1.2)
Total Protein: 6.5 g/dL (ref 6.5–8.1)

## 2022-10-01 LAB — LACTATE DEHYDROGENASE: LDH: 323 U/L — ABNORMAL HIGH (ref 98–192)

## 2022-10-01 LAB — FERRITIN: Ferritin: 585 ng/mL — ABNORMAL HIGH (ref 24–336)

## 2022-10-01 LAB — DIRECT ANTIGLOBULIN TEST (NOT AT ARMC)
DAT, IgG: NEGATIVE
DAT, complement: NEGATIVE

## 2022-10-01 LAB — SAMPLE TO BLOOD BANK

## 2022-10-01 LAB — FOLATE: Folate: 18.5 ng/mL (ref >5.9)

## 2022-10-01 NOTE — Patient Instructions (Addendum)
[place]  Discharge Instructions  You were seen and examined today Dr. Delton Coombes.  Your platelets are dropping. Your hemoglobin is better, but you remain anemic. Dr. Delton Coombes cannot explain the changes in your blood work, so he has recommended additional labs today for further evaluation.  Please see the eye doctor as soon as possible. If there is an infection with the eye, that could be the cause of the unexplained changes in blood work.  Follow-up as scheduled.  Thank you for choosing [facility] to provide your oncology and hematology care.   To afford each patient quality time with our provider, please arrive at least 15 minutes before your scheduled appointment time. You may need to reschedule your appointment if you arrive late (10 or more minutes). Arriving late affects you and other patients whose appointments are after yours.  Also, if you miss three or more appointments without notifying the office, you may be dismissed from the clinic at the provider's discretion.    Again, thank you for choosing [facility].  Our hope is that these requests will decrease the amount of time that you wait before being seen by our physicians.   If you have a lab appointment with the [facility] please come in thru the Main Entrance and check in at the main information desk.           _____________________________________________________________  Should you have questions after your visit to [facility], please contact our office at [phone] and follow the prompts.  Our office hours are 8:00 a.m. to 4:30 p.m. Monday - Thursday and 8:00 a.m. to 2:30 p.m. Friday.  Please note that voicemails left after 4:00 p.m. may not be returned until the following business day.  We are closed weekends and all major holidays.  You do have access to a nurse 24-7, just call the main number to the clinic [phone] and do not  press any options, hold on the line and a nurse will answer the phone.    For prescription refill requests, have your pharmacy contact our office and allow 72 hours.    Masks are optional in the cancer centers. If you would like for your care team to wear a mask while they are taking care of you, please let them know. You may have one support person who is at least 65 years old accompany you for your appointments.

## 2022-10-01 NOTE — Progress Notes (Signed)
Hold treatment today per Dr. Delton Coombes due to anemia.  Patient left in stable condition.

## 2022-10-01 NOTE — Progress Notes (Signed)
Ernest Barnett [place], [place] [address]   CLINIC:  Medical Oncology/Hematology  PCP:  Ernest Spar, MD [address] / [place] [address] [phone]   REASON FOR VISIT:  Follow-up for multiple myeloma  PRIOR THERAPY: CyBorD  NGS Results: not done  CURRENT THERAPY: Daratumumab SQ + Bortezomib + Dexamethasone (DaraVd) q21d / Daratumumab SQ q28d   BRIEF ONCOLOGIC HISTORY:  Oncology History  Multiple myeloma without remission ([place])  03/06/2021 Initial Diagnosis   Multiple myeloma ([place])   03/06/2021 Cancer Staging   Staging form: Plasma Cell Myeloma and Plasma Cell Disorders, AJCC 8th Edition - Clinical stage from 03/06/2021: RISS Stage II (Beta-2-microglobulin (mg/L): 5.5, Albumin (g/dL): 3.5, ISS: Stage III, High-risk cytogenetics: Absent, LDH: Normal) - Signed by Ernest Jack, MD on 03/06/2021 Stage prefix: Initial diagnosis Beta 2 microglobulin range (mg/L): Greater than or equal to 5.5 Albumin range (g/dL): Greater than or equal to 3.5 Cytogenetics: No abnormalities Pretreatment monoclonal protein level in serum (M spike) (g/dL): 2 Pretreatment serum free kappa light chain level (g/L): 794 Pretreatment serum free lambda light chain level (g/L): 15.9   03/14/2021 - 07/04/2021 Chemotherapy   Patient is on Treatment Plan : MULTIPLE MYELOMA CyBorD - Weekly Bortezomib     08/30/2021 -  Chemotherapy   Patient is on Treatment Plan : MYELOMA RELAPSED / REFRACTORY Daratumumab SQ + Bortezomib + Dexamethasone (DaraVd) q21d / Daratumumab SQ q28d        CANCER STAGING:  Cancer Staging  Multiple myeloma without remission (HCC) Staging form: Plasma Cell Myeloma and Plasma Cell Disorders, AJCC 8th Edition - Clinical stage from 03/06/2021: RISS Stage II (Beta-2-microglobulin (mg/L): 5.5, Albumin (g/dL): 3.5, ISS: Stage III, High-risk cytogenetics: Absent, LDH: Normal) - Signed by Ernest Jack, MD on 03/06/2021   INTERVAL HISTORY:  [name], a 66 y.o. male, seen for follow-up of multiple myeloma.  He had enucleation of the right eye done on 08/28/2022 at [facility].  He was reportedly receiving Keflex 500 twice daily which she finished on 09/04/2022.  He is applying erythromycin ointment 3 times daily as there is some purulent discharge from the orbit.  Denies any fevers or chills.  REVIEW OF SYSTEMS:  Review of Systems  Constitutional:  Negative for appetite change and unexpected weight change.  Respiratory:  Positive for shortness of breath.   Neurological:  Positive for dizziness and numbness.  All other systems reviewed and are negative.   PAST MEDICAL/SURGICAL HISTORY:  Past Medical History:  Diagnosis Date   Chronic kidney disease    COPD (chronic obstructive pulmonary disease) (HCC)    CVA (cerebral vascular accident) ([place]) 2013   Cystoid macular edema of left eye 03/07/2021   GERD (gastroesophageal reflux disease)    HOH (hard of hearing)    Hypercholesteremia    Hypertension    Left epiretinal membrane 03/07/2021   Pseudophakia 03/07/2021   Stroke ([place]) 03/24/2012   left sided weakness   Vitamin D deficiency    Past Surgical History:  Procedure Laterality Date   A/V FISTULAGRAM Right 01/29/2022   Procedure: A/V Fistulagram;  Surgeon: Serafina Mitchell, MD;  Location: [place] [facility] [facility];  Service: Cardiovascular;  Laterality: Right;   AV FISTULA PLACEMENT Right 02/08/2021   Procedure: RIGHT ARM ARTERIOVENOUS (AV) FISTULA CREATION;  Surgeon: [name], MD;  Location: [facility];  Service: Vascular;  Laterality: Right;   BIOPSY  11/12/2018   Procedure: BIOPSY;  Surgeon: Rogene [place], MD;  Location: [facility];  Service: Endoscopy;;  colon   BIOPSY  05/10/2020   Procedure: BIOPSY;  Surgeon: Rogene [place], MD;  [facility];  Service: Endoscopy;;  esophagus   CATARACT EXTRACTION     right eye   CATARACT EXTRACTION [facility]  10/08/2012   Procedure: CATARACT EXTRACTION PHACO AND INTRAOCULAR LENS  PLACEMENT ([place]);  Surgeon: Tonny Branch, MD;  Location: [facility];  Service: Ophthalmology;  Laterality: Left;  CDE:6.64   CHOLECYSTECTOMY     [place]   COLONOSCOPY N/A 11/12/2018   Procedure: COLONOSCOPY;  Surgeon: Rogene [place], MD;  Location[facility];  Service: Endoscopy;  Laterality: N/A;  1030   ELBOW FRACTURE SURGERY     left   ESOPHAGEAL DILATION N/A 11/12/2018   Procedure: ESOPHAGEAL DILATION;  Surgeon: Rogene [place], MD;  [facility];  Service: Endoscopy;  Laterality: N/A;   ESOPHAGEAL DILATION N/A 05/10/2020   Procedure: ESOPHAGEAL DILATION;  Surgeon: Rogene [place], MD;  [facility];  Service: Endoscopy;  Laterality: N/A;   ESOPHAGOGASTRODUODENOSCOPY N/A 11/12/2018   Procedure: ESOPHAGOGASTRODUODENOSCOPY (EGD);  Surgeon: Rogene [place], MD;  [facility];  Service: Endoscopy;  Laterality: N/A;   ESOPHAGOGASTRODUODENOSCOPY N/A 05/10/2020   Procedure: ESOPHAGOGASTRODUODENOSCOPY (EGD);  Surgeon: Rogene Houston, MD;  [facility];  Service: Endoscopy;  Laterality: N/A;  210   HERNIA REPAIR     right inguinal   HYDROCELE EXCISION / REPAIR     IR FLUORO GUIDE CV LINE LEFT  08/17/2021   IR US GUIDE VASC ACCESS LEFT  08/17/2021   LIGATION OF COMPETING BRANCHES OF ARTERIOVENOUS FISTULA Right 09/11/2021   Procedure: LIGATION OF COMPETING BRANCHES OF RIGHT ARM ARTERIOVENOUS FISTULA;  Surgeon: [name], MD;  Location: [facility];  Service: Vascular;  Laterality: Right;   PERIPHERAL VASCULAR BALLOON ANGIOPLASTY Right 01/29/2022   Procedure: PERIPHERAL VASCULAR BALLOON ANGIOPLASTY;  Surgeon: Serafina Mitchell, MD;  Location: [facility];  Service: Cardiovascular;  Laterality: Right;  arm fistula   POLYPECTOMY  11/12/2018   Procedure: POLYPECTOMY;  Surgeon: Rogene [place], MD;  [facility];  Service: Endoscopy;;  colon    RETINAL DETACHMENT SURGERY Left 2019   SPLENECTOMY, TOTAL     TONSILLECTOMY     VOCAL CORD INJECTION      removal of polyp-2005    SOCIAL HISTORY:  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  Tobacco Use   Smoking status: Former    Packs/day: 3.00    Years: 30.00    Total pack years: 90.00    Types: Cigarettes   Smokeless tobacco: Current   Tobacco comments:    Currently vape  Vaping Use   Vaping Use: Every day   Substances: Nicotine  Substance and Sexual Activity   Alcohol use: Not Currently    Alcohol/week: 1.0 - 2.0 standard drink of alcohol    Types: 1 - 2 Cans of beer per week    Comment: Occasionally   Drug use: No   Sexual activity: Not Currently    Birth control/protection: Abstinence, None  Other Topics Concern   Not on file  Social  History Narrative   2 children, both nearby   Social Determinants of Health   Financial Resource Strain: Low Risk  (05/30/2022)   Overall Financial Resource Strain (CARDIA)    Difficulty of Paying Living Expenses: Not hard at all  Food Insecurity: No Food Insecurity (05/30/2022)   Hunger Vital Sign    Worried About Running Out of Food in the Last Year: Never true    Ran Out of Food in the Last Year: Never true  Transportation Needs: No Transportation Needs (05/30/2022)   PRAPARE - Hydrologist (Medical): No    Lack of Transportation (Non-Medical): No  Physical Activity: Unknown (05/30/2022)   Exercise Vital Sign    Days of Exercise per Week: Not on file    Minutes of Exercise per Session: 0 min  Stress: No Stress Concern Present (05/30/2022)   [place]    Feeling of Stress : Not at all  Social Connections: Socially Isolated (05/30/2022)   Social Connection and Isolation Panel  [NHANES]    Frequency of Communication with Friends and Family: More than three times a week    Frequency of Social Gatherings with Friends and Family: More than three times a week    Attends Religious Services: Never    Marine scientist or Organizations: No    Attends Archivist Meetings: Never    Marital Status: Divorced  Human resources officer Violence: Not At Risk (05/30/2022)   Humiliation, Afraid, Rape, and Kick questionnaire    Fear of Current or Ex-Partner: No    Emotionally Abused: No    Physically Abused: No    Sexually Abused: No    FAMILY HISTORY:  No family history on file.  CURRENT MEDICATIONS:  Current Outpatient Medications  Medication Sig Dispense Refill   acetaminophen-codeine (TYLENOL #3) 300-30 MG tablet Take 2 tablets by mouth every 8 (eight) hours as needed.     acyclovir (ZOVIRAX) 200 MG capsule Take 200 mg by mouth 2 (two) times daily.     albuterol (VENTOLIN HFA) 108 (90 Base) MCG/ACT inhaler Inhale 2 puffs into the lungs every 6 (six) hours as needed for wheezing or shortness of breath. 8 g 5   aspirin EC 81 MG tablet Take 1 tablet (81 mg total) by mouth daily. 30 tablet 11   atropine 1 % ophthalmic solution Place 1 drop into the right eye 4 (four) times daily. 2 mL 12   bortezomib IV (VELCADE) 3.5 MG injection Inject 1.5 mg/m2 into the vein once a week.     brinzolamide (AZOPT) 1 % ophthalmic suspension Place 1 drop into the right eye 3 (three) times daily.     budesonide-formoterol (SYMBICORT) 80-4.5 MCG/ACT inhaler Inhale 2 puffs into the lungs 2 (two) times daily as needed (shortness of breath). 10.2 each 5   ceFEPIme 1 g in sodium chloride 0.9 % 100 mL Inject 1 g into the vein daily.     dexamethasone (DECADRON) 4 MG tablet Take 2.5 tablets (10 mg total) by mouth once a week. Take at home along with Tylenol and Benadryl weekly on the day of daratumumab injection. 30 tablet 2   docusate sodium (COLACE) 100 MG capsule Take 100 mg by mouth daily.      erythromycin ophthalmic ointment Apply to eye.     gabapentin (NEURONTIN) 300 MG capsule Take 1 capsule by mouth twice daily 60 capsule 6   midodrine (PROAMATINE) 10 MG tablet Take by  mouth.     Multiple Vitamins-Minerals (MULTIVITAMIN MEN 50+ PO) Take 1 tablet by mouth every morning.     multivitamin (RENA-VIT) TABS tablet Take 1 tablet by mouth daily.     Omega-3 Fatty Acids (FISH OIL) 1000 MG CAPS Take 1,000 mg by mouth in the morning, at noon, and at bedtime.     oxyCODONE (OXY IR/ROXICODONE) 5 MG immediate release tablet Take 1 tablet (5 mg total) by mouth 2 (two) times daily as needed for severe pain. 30 tablet 0   pantoprazole (PROTONIX) 40 MG tablet Take 1 tablet (40 mg total) by mouth daily. 90 tablet 3   polyethylene glycol (MIRALAX / GLYCOLAX) packet Take 17 g by mouth daily. Patient states that he takes as needed. 30 each 5   potassium chloride SA (KLOR-CON M) 20 MEQ tablet Take 4 tablets by mouth for one dose 4 tablet 0   pravastatin (PRAVACHOL) 80 MG tablet Take 1 tablet (80 mg total) by mouth daily. 90 tablet 3   ranolazine (RANEXA) 500 MG 12 hr tablet Take 500 mg by mouth daily.     senna (SENOKOT) 8.6 MG TABS tablet Take 2 tablets (17.2 mg total) by mouth daily. 120 tablet 2   sildenafil (REVATIO) 20 MG tablet Take 1 tablet (20 mg total) by mouth 5 (five) times daily as needed. 30 tablet 2   tamsulosin (FLOMAX) 0.4 MG CAPS capsule Take 1 capsule by mouth once daily 30 capsule 3   tiZANidine (ZANAFLEX) 2 MG tablet TAKE 1 TABLET BY MOUTH EVERY 6 HOURS AS NEEDED FOR MUSCLE SPASM 30 tablet 0   torsemide (DEMADEX) 100 MG tablet Take 100 mg by mouth 2 (two) times daily.     No current facility-administered medications for this visit.   Facility-Administered Medications Ordered in Other Visits  Medication Dose Route Frequency Provider Last Rate Last Admin   palonosetron (ALOXI) 0.25 MG/5ML injection             ALLERGIES:  No Known Allergies  PHYSICAL EXAM:  Performance  status (ECOG): 1 - Symptomatic but completely ambulatory  Vitals:   10/01/22 1015  BP: 94/73  Pulse: (!) 124  Resp: 20  Temp: 98.6 F (37 C)  SpO2: 97%    Wt Readings from Last 3 Encounters:  08/19/22 239 lb 8 oz (108.6 kg)  08/02/22 250 lb 12.8 oz (113.8 kg)  07/09/22 254 lb (115.2 kg)   Physical Exam Vitals reviewed.  Constitutional:      Appearance: Normal appearance.  Cardiovascular:     Rate and Rhythm: Normal rate and regular rhythm.     Pulses: Normal pulses.     Heart sounds: Normal heart sounds.  Pulmonary:     Effort: Pulmonary effort is normal.     Breath sounds: Normal breath sounds.  Neurological:     General: No focal deficit present.     Mental Status: He is alert and oriented to person, place, and time.  Psychiatric:        Mood and Affect: Mood normal.        Behavior: Behavior normal.     LABORATORY DATA:  I have reviewed the labs as listed.     Latest Ref Rng & Units 10/01/2022    9:53 AM 09/24/2022    8:58 AM 08/19/2022    8:55 AM  CBC  WBC 4.0 - 10.5 K/uL 6.0  4.6  4.5   Hemoglobin 13.0 - 17.0 g/dL 8.8  6.9  11.3   Hematocrit 39.0 -  52.0 % 26.7  20.8  33.2   Platelets 150 - 400 K/uL 59  85  118       Latest Ref Rng & Units 10/01/2022    9:53 AM 09/24/2022    8:58 AM 08/19/2022    8:55 AM  CMP  Glucose 70 - 99 mg/dL 102  168  146   BUN 8 - 23 mg/dL 39  31  37   Creatinine 0.61 - 1.24 mg/dL 5.48  4.60  4.29   Sodium 135 - 145 mmol/L 139  138  139   Potassium 3.5 - 5.1 mmol/L 4.0  2.6  3.4   Chloride 98 - 111 mmol/L 107  105  99   CO2 22 - 32 mmol/L _0 Calcium 8.9 - 10.3 mg/dL 8.8  8.4  9.8   Total Protein 6.5 - 8.1 g/dL 6.5  6.6  7.5   Total Bilirubin 0.3 - 1.2 mg/dL 0.4  0.5  0.7   Alkaline Phos 38 - 126 U/L 40  44  62   AST 15 - 41 U/L _1 ALT 0 - 44 U/L _2 DIAGNOSTIC IMAGING:  I have independently reviewed the scans and discussed with the patient. No results found.   ASSESSMENT:  1.  IgG  kappa multiple myeloma: -Kidney biopsy on 01/25/2021 for proteinuria showed kappa light chain monoclonal immunoglobulin deposition disease with no evidence of AL amyloidosis.  Basis for CKD is hypertension associated arteriosclerosis with arterionephrosclerosis. - Bone marrow biopsy on 02/21/2021 with slightly hypercellular bone marrow with trilineage hematopoiesis.  Plasma cells increased in number representing 11% of all cells with kappa light chain restriction. - Myeloma FISH panel with no evidence of T p53.  Plasma cell enrichment yielded limited cellularity, therefore only tested for T p53 probes at. - Chromosome analysis 45, X,- Y (8)/46, XY (12) - Labs on 02/01/2021-M spike 2 g, kappa light chain 794, free light chain ratio 49.97.  Beta-2 microglobulin 5.5, albumin 3.5.  LDH normal. - PET scan on 03/08/2021 with no evidence of active myeloma.  No soft tissue plasmacytoma. - 24-hour urine total protein 2.2 g on 03/13/2021.  Immunofixation positive for IgG kappa. - 4 cycles of CyBorD from 03/14/2021 through 07/04/2021 held due to fluid retention.  Myeloma labs on 08/08/2021 showed progression. - Daratumumab, Velcade and dexamethasone started on 08/30/2021.   2.  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  Smoked 2 packs/day for 32 years and quit smoking cigarettes and vaping now. -Mother had small cell lung cancer.   3.  CKD: -He has stage V CKD thought to be secondary to hypertension. -Renal ultrasound on 12/19/2020 was negative for obstructive uropathy and findings were consistent with chronic medical renal disease.  Bilateral kidney cysts were seen. -He has 2.7 g of proteinuria on 24-hour urine. -Kidney biopsy was done on 01/25/2021.  4.  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  I have reviewed all records from [facility]. -  Was thought to be possibly due to ischemic event.  They have recommended hypercoagulable work-up. - I have reviewed hypercoagulable labs including protein C, protein S, Antithrombin III, factor V Leiden, prothrombin gene mutations which were negative.  He is currently on steroids. - He was found to  have elevated inflammatory tumor markers including ESR, CRP, C3 and C4. - His multiple myeloma is under control and unlikely etiology for this event.   PLAN:  1.  Stage II, standard risk IgG kappa multiple myeloma: - Last treatment with Velcade was on 06/27/2022. - Reviewed myeloma labs from 09/24/2022.  M spike is down to 0.1 g.  Kappa light chains are 52 and ratio is 2.34. - His myeloma labs are looking better.  Hence I would not start treatment at this time because of ongoing problems with his right eye. - His CBC on 09/24/2022 showed drop in platelet count 85,000 from 118, and hemoglobin to 6.9 from 11.3.  He received 2 units PRBC. - Today CBC repeated showed hemoglobin 8.8.  Platelet count 59.  MCV 108.  White count 6.0.  However ANC is low at 0.4.  Differential with 6% neutrophils, 89% lymphocytes, 2% monocytes and 1% eosinophil. - Ferritin is 585, percent saturation 65.  Vitamin B12 is low at 120.  Folic acid is 56.4.  LDH is 323. - This could be severe B12 deficiency.  However methylmalonic acid is pending.  Direct Coombs test is also pending. - Will start him on B12 injections and see if it improves.  Will continue to monitor his CBC every 2 weeks.  RTC 6 weeks with repeat myeloma labs.   2.  Smoking history: - Last chest CT on 02/15/2021 was lung RADS 1.   3.  ID prophylaxis: - Continue aspirin 81 mg daily-acyclovir twice daily.   6.  Lower extremity swelling: - Continue furosemide twice daily.  7.  ESRD on HD: - Continue peritoneal dialysis which was started on 05/15/2022.  8.  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 - I reviewed pathology which showed endophthalmitis with intravitreal necrotic abscess. - He completed Keflex 100 twice daily from 08/28/2022 through 09/04/2022.  He is applying erythromycin ointment 3 times daily. - He is reporting purulent discharge from the orbit on and off.  Recommend follow-up with ophthalmology at [facility].   Orders placed this encounter:  Orders Placed This Encounter  Procedures   Lactate dehydrogenase   Iron and TIBC ([facility] ONLY)   Ferritin   Vitamin B12   Folate   Methylmalonic acid, serum   Copper, serum   Direct antiglobulin test      Ernest Jack, MD [place] [phone]

## 2022-10-01 NOTE — Progress Notes (Signed)
CRITICAL VALUE ALERT Critical value received:  ANC 0.4 Date of notification:  10-01-2022 Time of notification: 10:42 am.  Critical value read back:  Yes.   Nurse who received alert:  B.Tevita Gomer RN MD notified time and response:  Dr. Delton Coombes @ 10:56 am. Patient has appointment with Dr. Delton Coombes for f/u .

## 2022-10-01 NOTE — Progress Notes (Signed)
Na

## 2022-10-02 DIAGNOSIS — E538 Deficiency of other specified B group vitamins: Secondary | ICD-10-CM | POA: Insufficient documentation

## 2022-10-02 DIAGNOSIS — N186 End stage renal disease: Secondary | ICD-10-CM | POA: Diagnosis not present

## 2022-10-02 DIAGNOSIS — Z992 Dependence on renal dialysis: Secondary | ICD-10-CM | POA: Diagnosis not present

## 2022-10-03 DIAGNOSIS — N186 End stage renal disease: Secondary | ICD-10-CM | POA: Diagnosis not present

## 2022-10-03 DIAGNOSIS — Z992 Dependence on renal dialysis: Secondary | ICD-10-CM | POA: Diagnosis not present

## 2022-10-03 LAB — METHYLMALONIC ACID, SERUM: Methylmalonic Acid, Quantitative: 833 nmol/L — ABNORMAL HIGH (ref 0–378)

## 2022-10-04 DIAGNOSIS — Z992 Dependence on renal dialysis: Secondary | ICD-10-CM | POA: Diagnosis not present

## 2022-10-04 DIAGNOSIS — N186 End stage renal disease: Secondary | ICD-10-CM | POA: Diagnosis not present

## 2022-10-07 DIAGNOSIS — Z992 Dependence on renal dialysis: Secondary | ICD-10-CM | POA: Diagnosis not present

## 2022-10-07 DIAGNOSIS — N186 End stage renal disease: Secondary | ICD-10-CM | POA: Diagnosis not present

## 2022-10-08 ENCOUNTER — Inpatient Hospital Stay: Payer: Medicare HMO | Attending: Hematology

## 2022-10-08 VITALS — BP 116/65 | HR 117 | Temp 98.2°F

## 2022-10-08 DIAGNOSIS — E538 Deficiency of other specified B group vitamins: Secondary | ICD-10-CM | POA: Insufficient documentation

## 2022-10-08 DIAGNOSIS — C9000 Multiple myeloma not having achieved remission: Secondary | ICD-10-CM | POA: Insufficient documentation

## 2022-10-08 DIAGNOSIS — N186 End stage renal disease: Secondary | ICD-10-CM | POA: Diagnosis not present

## 2022-10-08 DIAGNOSIS — Z992 Dependence on renal dialysis: Secondary | ICD-10-CM | POA: Diagnosis not present

## 2022-10-08 MED ORDER — CYANOCOBALAMIN 1000 MCG/ML IJ SOLN
1000.0000 ug | Freq: Once | INTRAMUSCULAR | Status: AC
  Administered 2022-10-08: 1000 ug via INTRAMUSCULAR

## 2022-10-08 NOTE — Patient Instructions (Signed)
[facility] [place]  Discharge Instructions: Thank you for choosing [facility] to provide your oncology and hematology care.  If you have a lab appointment with the Cancer Center, please come in thru the Main Entrance and check in at the main information desk.  Wear comfortable clothing and clothing appropriate for easy access to any Portacath or PICC line.   We strive to give you quality time with your provider. You may need to reschedule your appointment if you arrive late (15 or more minutes).  Arriving late affects you and other patients whose appointments are after yours.  Also, if you miss three or more appointments without notifying the office, you may be dismissed from the clinic at the provider's discretion.      For prescription refill requests, have your pharmacy contact our office and allow 72 hours for refills to be completed.    Today you received the following chemotherapy and/or immunotherapy agents B12 injection.      To help prevent nausea and vomiting after your treatment, we encourage you to take your nausea medication as directed.  BELOW ARE SYMPTOMS THAT SHOULD BE REPORTED IMMEDIATELY: *FEVER GREATER THAN 100.4 F (38 C) OR HIGHER *CHILLS OR SWEATING *NAUSEA AND VOMITING THAT IS NOT CONTROLLED WITH YOUR NAUSEA MEDICATION *UNUSUAL SHORTNESS OF BREATH *UNUSUAL BRUISING OR BLEEDING *URINARY PROBLEMS (pain or burning when urinating, or frequent urination) *BOWEL PROBLEMS (unusual diarrhea, constipation, pain near the anus) TENDERNESS IN MOUTH AND THROAT WITH OR WITHOUT PRESENCE OF ULCERS (sore throat, sores in mouth, or a toothache) UNUSUAL RASH, SWELLING OR PAIN  UNUSUAL VAGINAL DISCHARGE OR ITCHING   Items with * indicate a potential emergency and should be followed up as soon as possible or go to the Emergency Department if any problems should occur.  Please show the CHEMOTHERAPY ALERT CARD or IMMUNOTHERAPY ALERT CARD at check-in to the  Emergency Department and triage nurse.  Should you have questions after your visit or need to cancel or reschedule your appointment, please contact [facility] [phone]  and follow the prompts.  Office hours are 8:00 a.m. to 4:30 p.m. Monday - Friday. Please note that voicemails left after 4:00 p.m. may not be returned until the following business day.  We are closed weekends and major holidays. You have access to a nurse at all times for urgent questions. Please call the main number to the clinic [phone] and follow the prompts.  For any non-urgent questions, you may also contact your provider using MyChart. We now offer e-Visits for anyone 18 and older to request care online for non-urgent symptoms. For details visit [email or web address].   Also download the MyChart app! Go to the app store, search "MyChart", open the app, select [facility], and log in with your MyChart username and password.  Masks are optional in the cancer centers. If you would like for your care team to wear a mask while they are taking care of you, please let them know. You may have one support person who is at least 65 years old accompany you for your appointments.  

## 2022-10-08 NOTE — Progress Notes (Signed)
Ernest Barnett presents today for injection per the provider's orders.  B12 administration without incident; injection site WNL; see MAR for injection details.  Patient tolerated procedure well and without incident.  No questions or complaints noted at this time. Heart rate elevated on arrival. Patient's normal. Patient denies any dizziness or feeling light headed. Patient's wife at the bedside and states he is always in the 120's and Dr. Posey Pronto is aware. Patient wearing a heart monitor at this time.

## 2022-10-09 DIAGNOSIS — N186 End stage renal disease: Secondary | ICD-10-CM | POA: Diagnosis not present

## 2022-10-09 DIAGNOSIS — Z992 Dependence on renal dialysis: Secondary | ICD-10-CM | POA: Diagnosis not present

## 2022-10-09 LAB — COPPER, SERUM: Copper: 87 ug/dL (ref 69–132)

## 2022-10-10 DIAGNOSIS — Z992 Dependence on renal dialysis: Secondary | ICD-10-CM | POA: Diagnosis not present

## 2022-10-10 DIAGNOSIS — N186 End stage renal disease: Secondary | ICD-10-CM | POA: Diagnosis not present

## 2022-10-11 DIAGNOSIS — Z992 Dependence on renal dialysis: Secondary | ICD-10-CM | POA: Diagnosis not present

## 2022-10-11 DIAGNOSIS — N186 End stage renal disease: Secondary | ICD-10-CM | POA: Diagnosis not present

## 2022-10-14 ENCOUNTER — Other Ambulatory Visit: Payer: Self-pay

## 2022-10-14 DIAGNOSIS — N186 End stage renal disease: Secondary | ICD-10-CM | POA: Diagnosis not present

## 2022-10-14 DIAGNOSIS — Z992 Dependence on renal dialysis: Secondary | ICD-10-CM | POA: Diagnosis not present

## 2022-10-14 DIAGNOSIS — D6481 Anemia due to antineoplastic chemotherapy: Secondary | ICD-10-CM

## 2022-10-14 DIAGNOSIS — C9000 Multiple myeloma not having achieved remission: Secondary | ICD-10-CM

## 2022-10-15 ENCOUNTER — Inpatient Hospital Stay: Payer: Medicare HMO

## 2022-10-15 VITALS — BP 92/71 | HR 119 | Temp 98.1°F | Resp 19

## 2022-10-15 DIAGNOSIS — N186 End stage renal disease: Secondary | ICD-10-CM | POA: Diagnosis not present

## 2022-10-15 DIAGNOSIS — C9000 Multiple myeloma not having achieved remission: Secondary | ICD-10-CM

## 2022-10-15 DIAGNOSIS — Z992 Dependence on renal dialysis: Secondary | ICD-10-CM | POA: Diagnosis not present

## 2022-10-15 DIAGNOSIS — D6481 Anemia due to antineoplastic chemotherapy: Secondary | ICD-10-CM

## 2022-10-15 DIAGNOSIS — E538 Deficiency of other specified B group vitamins: Secondary | ICD-10-CM | POA: Diagnosis not present

## 2022-10-15 LAB — CBC WITH DIFFERENTIAL/PLATELET
Abs Immature Granulocytes: 0 10*3/uL (ref 0.00–0.07)
Basophils Absolute: 0 10*3/uL (ref 0.0–0.1)
Basophils Relative: 0 %
Eosinophils Absolute: 0.2 10*3/uL (ref 0.0–0.5)
Eosinophils Relative: 1 %
HCT: 24.1 % — ABNORMAL LOW (ref 39.0–52.0)
Hemoglobin: 7.9 g/dL — ABNORMAL LOW (ref 13.0–17.0)
Lymphocytes Relative: 88 %
Lymphs Abs: 13.8 10*3/uL — ABNORMAL HIGH (ref 0.7–4.0)
MCH: 35.6 pg — ABNORMAL HIGH (ref 26.0–34.0)
MCHC: 32.8 g/dL (ref 30.0–36.0)
MCV: 108.6 fL — ABNORMAL HIGH (ref 80.0–100.0)
Monocytes Absolute: 0.8 10*3/uL (ref 0.1–1.0)
Monocytes Relative: 5 %
Neutro Abs: 0.9 10*3/uL — ABNORMAL LOW (ref 1.7–7.7)
Neutrophils Relative %: 6 %
Platelets: 77 10*3/uL — ABNORMAL LOW (ref 150–400)
RBC: 2.22 MIL/uL — ABNORMAL LOW (ref 4.22–5.81)
RDW: 25.1 % — ABNORMAL HIGH (ref 11.5–15.5)
WBC: 15.7 10*3/uL — ABNORMAL HIGH (ref 4.0–10.5)
nRBC: 5.6 % — ABNORMAL HIGH (ref 0.0–0.2)

## 2022-10-15 LAB — COMPREHENSIVE METABOLIC PANEL
ALT: 17 U/L (ref 0–44)
AST: 12 U/L — ABNORMAL LOW (ref 15–41)
Albumin: 3.6 g/dL (ref 3.5–5.0)
Alkaline Phosphatase: 37 U/L — ABNORMAL LOW (ref 38–126)
Anion gap: 13 (ref 5–15)
BUN: 39 mg/dL — ABNORMAL HIGH (ref 8–23)
CO2: 23 mmol/L (ref 22–32)
Calcium: 9.8 mg/dL (ref 8.9–10.3)
Chloride: 102 mmol/L (ref 98–111)
Creatinine, Ser: 6.51 mg/dL — ABNORMAL HIGH (ref 0.61–1.24)
GFR, Estimated: 9 mL/min — ABNORMAL LOW (ref >60)
Glucose, Bld: 108 mg/dL — ABNORMAL HIGH (ref 70–99)
Potassium: 4.3 mmol/L (ref 3.5–5.1)
Sodium: 138 mmol/L (ref 135–145)
Total Bilirubin: 0.7 mg/dL (ref 0.3–1.2)
Total Protein: 6.9 g/dL (ref 6.5–8.1)

## 2022-10-15 LAB — SAMPLE TO BLOOD BANK

## 2022-10-15 LAB — MAGNESIUM: Magnesium: 2 mg/dL (ref 1.7–2.4)

## 2022-10-15 MED ORDER — CYANOCOBALAMIN 1000 MCG/ML IJ SOLN: 1000.0000 ug | Freq: Once | INTRAMUSCULAR | Status: DC

## 2022-10-15 MED ORDER — CYANOCOBALAMIN 1000 MCG/ML IJ SOLN
1000.0000 ug | Freq: Once | INTRAMUSCULAR | Status: AC
  Administered 2022-10-15: 1000 ug via INTRAMUSCULAR
  Filled 2022-10-15: qty 1

## 2022-10-15 NOTE — Progress Notes (Signed)
Patient presents today for B12 injection per providers order.  Vital signs WNL.  Patient has no new complaints at this time.  Stable during administration without incident; injection site WNL; see MAR for injection details.  Patient tolerated procedure well and without incident.  No questions or complaints noted at this time.  

## 2022-10-15 NOTE — Patient Instructions (Signed)
[facility] [place]  Discharge Instructions: Thank you for choosing [facility] to provide your oncology and hematology care.  If you have a lab appointment with the Cancer Center, please come in thru the Main Entrance and check in at the main information desk.  Wear comfortable clothing and clothing appropriate for easy access to any Portacath or PICC line.   We strive to give you quality time with your provider. You may need to reschedule your appointment if you arrive late (15 or more minutes).  Arriving late affects you and other patients whose appointments are after yours.  Also, if you miss three or more appointments without notifying the office, you may be dismissed from the clinic at the provider's discretion.      For prescription refill requests, have your pharmacy contact our office and allow 72 hours for refills to be completed.    Today you received the following chemotherapy and/or immunotherapy agents B12      To help prevent nausea and vomiting after your treatment, we encourage you to take your nausea medication as directed.  BELOW ARE SYMPTOMS THAT SHOULD BE REPORTED IMMEDIATELY: *FEVER GREATER THAN 100.4 F (38 C) OR HIGHER *CHILLS OR SWEATING *NAUSEA AND VOMITING THAT IS NOT CONTROLLED WITH YOUR NAUSEA MEDICATION *UNUSUAL SHORTNESS OF BREATH *UNUSUAL BRUISING OR BLEEDING *URINARY PROBLEMS (pain or burning when urinating, or frequent urination) *BOWEL PROBLEMS (unusual diarrhea, constipation, pain near the anus) TENDERNESS IN MOUTH AND THROAT WITH OR WITHOUT PRESENCE OF ULCERS (sore throat, sores in mouth, or a toothache) UNUSUAL RASH, SWELLING OR PAIN  UNUSUAL VAGINAL DISCHARGE OR ITCHING   Items with * indicate a potential emergency and should be followed up as soon as possible or go to the Emergency Department if any problems should occur.  Please show the CHEMOTHERAPY ALERT CARD or IMMUNOTHERAPY ALERT CARD at check-in to the Emergency  Department and triage nurse.  Should you have questions after your visit or need to cancel or reschedule your appointment, please contact [facility] [place] [phone]  and follow the prompts.  Office hours are 8:00 a.m. to 4:30 p.m. Monday - Friday. Please note that voicemails left after 4:00 p.m. may not be returned until the following business day.  We are closed weekends and major holidays. You have access to a nurse at all times for urgent questions. Please call the main number to the clinic [phone] and follow the prompts.  For any non-urgent questions, you may also contact your provider using MyChart. We now offer e-Visits for anyone 18 and older to request care online for non-urgent symptoms. For details visit [email or web address].   Also download the MyChart app! Go to the app store, search "MyChart", open the app, select [facility], and log in with your MyChart username and password.  Masks are optional in the cancer centers. If you would like for your care team to wear a mask while they are taking care of you, please let them know. You may have one support person who is at least 65 years old accompany you for your appointments.  

## 2022-10-16 ENCOUNTER — Inpatient Hospital Stay: Payer: Medicare HMO

## 2022-10-16 DIAGNOSIS — Z992 Dependence on renal dialysis: Secondary | ICD-10-CM | POA: Diagnosis not present

## 2022-10-16 DIAGNOSIS — N186 End stage renal disease: Secondary | ICD-10-CM | POA: Diagnosis not present

## 2022-10-16 LAB — KAPPA/LAMBDA LIGHT CHAINS
Kappa free light chain: 85.6 mg/L — ABNORMAL HIGH (ref 3.3–19.4)
Kappa, lambda light chain ratio: 2.55 — ABNORMAL HIGH (ref 0.26–1.65)
Lambda free light chains: 33.6 mg/L — ABNORMAL HIGH (ref 5.7–26.3)

## 2022-10-17 ENCOUNTER — Other Ambulatory Visit: Payer: Self-pay | Admitting: Internal Medicine

## 2022-10-17 DIAGNOSIS — Z992 Dependence on renal dialysis: Secondary | ICD-10-CM | POA: Diagnosis not present

## 2022-10-17 DIAGNOSIS — N186 End stage renal disease: Secondary | ICD-10-CM | POA: Diagnosis not present

## 2022-10-18 DIAGNOSIS — I129 Hypertensive chronic kidney disease with stage 1 through stage 4 chronic kidney disease, or unspecified chronic kidney disease: Secondary | ICD-10-CM | POA: Diagnosis not present

## 2022-10-18 DIAGNOSIS — K219 Gastro-esophageal reflux disease without esophagitis: Secondary | ICD-10-CM | POA: Diagnosis not present

## 2022-10-18 DIAGNOSIS — I69353 Hemiplegia and hemiparesis following cerebral infarction affecting right non-dominant side: Secondary | ICD-10-CM | POA: Diagnosis not present

## 2022-10-18 DIAGNOSIS — E876 Hypokalemia: Secondary | ICD-10-CM | POA: Diagnosis not present

## 2022-10-18 DIAGNOSIS — N189 Chronic kidney disease, unspecified: Secondary | ICD-10-CM | POA: Diagnosis not present

## 2022-10-18 DIAGNOSIS — N186 End stage renal disease: Secondary | ICD-10-CM | POA: Diagnosis not present

## 2022-10-18 DIAGNOSIS — Z992 Dependence on renal dialysis: Secondary | ICD-10-CM | POA: Diagnosis not present

## 2022-10-18 DIAGNOSIS — R609 Edema, unspecified: Secondary | ICD-10-CM | POA: Diagnosis not present

## 2022-10-18 DIAGNOSIS — M62838 Other muscle spasm: Secondary | ICD-10-CM | POA: Diagnosis not present

## 2022-10-18 DIAGNOSIS — J439 Emphysema, unspecified: Secondary | ICD-10-CM | POA: Diagnosis not present

## 2022-10-18 DIAGNOSIS — R32 Unspecified urinary incontinence: Secondary | ICD-10-CM | POA: Diagnosis not present

## 2022-10-18 DIAGNOSIS — Z79891 Long term (current) use of opiate analgesic: Secondary | ICD-10-CM | POA: Diagnosis not present

## 2022-10-18 DIAGNOSIS — N529 Male erectile dysfunction, unspecified: Secondary | ICD-10-CM | POA: Diagnosis not present

## 2022-10-21 DIAGNOSIS — Z992 Dependence on renal dialysis: Secondary | ICD-10-CM | POA: Diagnosis not present

## 2022-10-21 DIAGNOSIS — N186 End stage renal disease: Secondary | ICD-10-CM | POA: Diagnosis not present

## 2022-10-22 ENCOUNTER — Encounter: Payer: Self-pay | Admitting: Cardiology

## 2022-10-22 ENCOUNTER — Ambulatory Visit: Payer: Medicare HMO | Attending: Cardiology | Admitting: Cardiology

## 2022-10-22 VITALS — BP 98/60 | HR 117 | Ht 66.5 in | Wt 267.2 lb

## 2022-10-22 DIAGNOSIS — R0602 Shortness of breath: Secondary | ICD-10-CM

## 2022-10-22 DIAGNOSIS — N186 End stage renal disease: Secondary | ICD-10-CM | POA: Diagnosis not present

## 2022-10-22 DIAGNOSIS — R600 Localized edema: Secondary | ICD-10-CM | POA: Diagnosis not present

## 2022-10-22 DIAGNOSIS — Z992 Dependence on renal dialysis: Secondary | ICD-10-CM | POA: Diagnosis not present

## 2022-10-22 MED ORDER — POTASSIUM CHLORIDE CRYS ER 20 MEQ PO TBCR: 20.0000 meq | EXTENDED_RELEASE_TABLET | Freq: Two times a day (BID) | ORAL | Status: DC

## 2022-10-22 NOTE — Progress Notes (Signed)
Clinical Summary Ernest Barnett is a 66 y.o.male seen today for follow up of the following medical problems  SOB/LE edema - 05/2021 renal stopped norvasc, started lasix 07/2021 echo: LVEF 50-55%, apical hypokinesis,  10/2021 echo: LVEF 50-55% Jan 2023 nuclear stress: mild apical and mid to basal inferoseptal ischemia.Low risk    - chronic ongoing SOB/DOE, +cough, some wheezing.  - poor compliance with inhalers, recently increased compliance.     2. ESRD - followed by Dr [name]  - on peritoneal catheter placement - low bp's at home at times.    3. Prior CVA   4. COPD - compliant with inhalers.    5. OSA eval - upcopming appt with Dr Elsworth Soho    6.Multiple myeloma - followed by heme/onc  7. Vision loss - we had been asked to arrange a 30 day monitor for possible cardioembolic etiology for visition loss - still wearing monitor  8. Chronic sinus tachycardia  Past Medical History:  Diagnosis Date   Chronic kidney disease    COPD (chronic obstructive pulmonary disease) ([place])    CVA (cerebral vascular accident) ([place]) 2013   Cystoid macular edema of left eye 03/07/2021   GERD (gastroesophageal reflux disease)    HOH (hard of hearing)    Hypercholesteremia    Hypertension    Left epiretinal membrane 03/07/2021   Pseudophakia 03/07/2021   Stroke ([place]) 03/24/2012   left sided weakness   Vitamin D deficiency      No Known Allergies   Current Outpatient Medications  Medication Sig Dispense Refill   acetaminophen-codeine (TYLENOL #3) 300-30 MG tablet Take 2 tablets by mouth every 8 (eight) hours as needed.     acyclovir (ZOVIRAX) 200 MG capsule Take 200 mg by mouth 2 (two) times daily.     albuterol (VENTOLIN HFA) 108 (90 Base) MCG/ACT inhaler Inhale 2 puffs into the lungs every 6 (six) hours as needed for wheezing or shortness of breath. 8 g 5   aspirin EC 81 MG tablet Take 1 tablet (81 mg total) by mouth daily. 30 tablet 11   bortezomib IV (VELCADE) 3.5 MG injection Inject  1.5 mg/m2 into the vein once a week.     budesonide-formoterol (SYMBICORT) 80-4.5 MCG/ACT inhaler Inhale 2 puffs into the lungs 2 (two) times daily as needed (shortness of breath). 10.2 each 5   dexamethasone (DECADRON) 4 MG tablet Take 2.5 tablets (10 mg total) by mouth once a week. Take at home along with Tylenol and Benadryl weekly on the day of daratumumab injection. 30 tablet 2   docusate sodium (COLACE) 100 MG capsule Take 100 mg by mouth daily.     erythromycin ophthalmic ointment Apply to eye.     gabapentin (NEURONTIN) 300 MG capsule Take 1 capsule by mouth twice daily 60 capsule 6   midodrine (PROAMATINE) 10 MG tablet Take by mouth.     multivitamin (RENA-VIT) TABS tablet Take 1 tablet by mouth daily.     Omega-3 Fatty Acids (FISH OIL) 1000 MG CAPS Take 1,000 mg by mouth in the morning, at noon, and at bedtime.     oxyCODONE (OXY IR/ROXICODONE) 5 MG immediate release tablet Take 1 tablet (5 mg total) by mouth 2 (two) times daily as needed for severe pain. 30 tablet 0   pantoprazole (PROTONIX) 40 MG tablet Take 1 tablet (40 mg total) by mouth daily. 90 tablet 3   polyethylene glycol (MIRALAX / GLYCOLAX) packet Take 17 g by mouth daily. Patient states that he  takes as needed. 30 each 5   pravastatin (PRAVACHOL) 80 MG tablet Take 1 tablet (80 mg total) by mouth daily. 90 tablet 3   senna (SENOKOT) 8.6 MG TABS tablet Take 2 tablets (17.2 mg total) by mouth daily. 120 tablet 2   sildenafil (REVATIO) 20 MG tablet Take 1 tablet (20 mg total) by mouth 5 (five) times daily as needed. 30 tablet 2   tamsulosin (FLOMAX) 0.4 MG CAPS capsule Take 1 capsule by mouth once daily 30 capsule 0   tiZANidine (ZANAFLEX) 2 MG tablet TAKE 1 TABLET BY MOUTH EVERY 6 HOURS AS NEEDED FOR MUSCLE SPASM 30 tablet 0   torsemide (DEMADEX) 100 MG tablet Take 100 mg by mouth 2 (two) times daily.     No current facility-administered medications for this visit.   Facility-Administered Medications Ordered in Other Visits   Medication Dose Route Frequency Provider Last Rate Last Admin   palonosetron (ALOXI) 0.25 MG/5ML injection              Past Surgical History:  Procedure Laterality Date   A/V FISTULAGRAM Right 01/29/2022   Procedure: A/V Fistulagram;  Surgeon: Serafina Mitchell, MD;  Location: [facility];  Service: Cardiovascular;  Laterality: Right;   AV FISTULA PLACEMENT Right 02/08/2021   Procedure: RIGHT ARM ARTERIOVENOUS (AV) FISTULA CREATION;  Surgeon: [name], MD;  Location: [facility];  Service: Vascular;  Laterality: Right;   BIOPSY  11/12/2018   Procedure: BIOPSY;  Surgeon: Rogene Houston, MD;  [facility];  Service: Endoscopy;;  colon   BIOPSY  05/10/2020   Procedure: BIOPSY;  Surgeon: Rogene Houston, MD;  [facility];  Service: Endoscopy;;  esophagus   CATARACT EXTRACTION     right eye   CATARACT EXTRACTION W/PHACO  10/08/2012   Procedure: CATARACT EXTRACTION PHACO AND INTRAOCULAR LENS PLACEMENT ([place]);  Surgeon: Tonny , MD;  Location: [facility];  Service: Ophthalmology;  Laterality: Left;  CDE:6.64   CHOLECYSTECTOMY     [place]   COLONOSCOPY N/A 11/12/2018   Procedure: COLONOSCOPY;  Surgeon: Rogene Houston, MD;  [facility];  Service: Endoscopy;  Laterality: N/A;  1030   ELBOW FRACTURE SURGERY     left   ESOPHAGEAL DILATION N/A 11/12/2018   Procedure: ESOPHAGEAL DILATION;  Surgeon: Rogene Houston, MD;  [facility];  Service: Endoscopy;  Laterality: N/A;   ESOPHAGEAL DILATION N/A 05/10/2020   Procedure: ESOPHAGEAL DILATION;  Surgeon: Rogene Houston, MD;  [facility];  Service: Endoscopy;  Laterality: N/A;   ESOPHAGOGASTRODUODENOSCOPY N/A 11/12/2018   Procedure: ESOPHAGOGASTRODUODENOSCOPY (EGD);  Surgeon: Rogene Houston, MD;  [facility];  Service: Endoscopy;  Laterality: N/A;   ESOPHAGOGASTRODUODENOSCOPY N/A 05/10/2020   Procedure: ESOPHAGOGASTRODUODENOSCOPY (EGD);  Surgeon: Rogene Houston, MD;  [facility];  Service: Endoscopy;  Laterality: N/A;  210   HERNIA REPAIR     right inguinal   HYDROCELE EXCISION / REPAIR     IR FLUORO GUIDE CV LINE LEFT  08/17/2021   IR US GUIDE VASC ACCESS LEFT  08/17/2021   LIGATION OF COMPETING BRANCHES OF ARTERIOVENOUS FISTULA Right 09/11/2021   Procedure: LIGATION OF COMPETING BRANCHES OF RIGHT ARM ARTERIOVENOUS FISTULA;  Surgeon: [name], MD;  Location: [facility];  Service: Vascular;  Laterality: Right;   PERIPHERAL VASCULAR BALLOON ANGIOPLASTY Right 01/29/2022   Procedure: PERIPHERAL VASCULAR BALLOON ANGIOPLASTY;  Surgeon: Serafina Mitchell, MD;  Location: [facility];  Service: Cardiovascular;  Laterality: Right;  arm fistula   POLYPECTOMY  11/12/2018   Procedure: POLYPECTOMY;  Surgeon: Rogene Houston, MD;  [facility];  Service: Endoscopy;;  colon    RETINAL DETACHMENT SURGERY Left 2019   SPLENECTOMY, TOTAL     TONSILLECTOMY     VOCAL CORD INJECTION     removal of polyp-2005     No Known Allergies    No family history on file.   Social History Ernest Barnett reports that he has quit smoking. His smoking use included cigarettes. He has a 90.00 pack-year smoking history. He uses smokeless tobacco. Ernest Barnett reports that he does not currently use alcohol after a past usage of about 1.0 - 2.0 standard drink of alcohol per week.   Review of Systems CONSTITUTIONAL: No weight loss, fever, chills, weakness or fatigue.  HEENT: Eyes: No visual loss, blurred vision, double vision or yellow sclerae.No hearing loss, sneezing, congestion, runny nose or sore throat.  SKIN: No rash or itching.  CARDIOVASCULAR: per hpi RESPIRATORY: per hpi GASTROINTESTINAL: No anorexia, nausea, vomiting or diarrhea. No abdominal pain or blood.  GENITOURINARY: No burning on urination, no polyuria NEUROLOGICAL: No headache, dizziness, syncope, paralysis, ataxia, numbness or tingling in the extremities. No change in bowel or bladder control.  MUSCULOSKELETAL:  No muscle, back pain, joint pain or stiffness.  LYMPHATICS: No enlarged nodes. No history of splenectomy.  PSYCHIATRIC: No history of depression or anxiety.  ENDOCRINOLOGIC: No reports of sweating, cold or heat intolerance. No polyuria or polydipsia.  [name]   Physical Examination Today's Vitals   10/22/22 1253  BP: 98/60  Pulse: (!) 117  SpO2: 94%  Weight: 267 lb 3.2 oz (121.2 kg)  Height: 5' 6.5" (1.689 m)   Body mass index is 42.48 kg/m.  Gen: resting comfortably, no acute distress HEENT: no scleral icterus, pupils equal round and reactive, no palptable cervical adenopathy,  CV: regular, tachy, no m/rg, no jvd Resp: Clear to auscultation bilaterally GI: abdomen is soft, non-tender, non-distended, normal bowel sounds, no hepatosplenomegaly MSK: extremities are warm, 1+bilateral LE edema Skin: warm, no rash Neuro:  no focal deficits Psych: appropriate affect   Diagnostic Studies  10/2021 echo 1. LImited study with Definity Apical windows foreshortened. Images are  still difficult Mid/Distal inferior and apical hyokinesis. Overall LVEF is  probably 50 to 55%.   2. Right ventricular systolic function is normal. The right ventricular  size is normal.    Jan 2023 nuclear stress  Findings are consistent with ischemia. The study is low risk.   No ST deviation was noted. The ECG was negative for ischemia.   LV perfusion is abnormal.  Small, mild to moderate intensity, apical and mid to basal inferoseptal defects that are partially reversible and consistent with ischemia.   Left ventricular function is normal. Nuclear stress EF: 60 %.   Overall low risk study with small ischemic territories involving the apex and mid inferoseptal walls, LVEF normal at 60% without regional wall motion abnormalities.     Assessment and Plan   1.SOB/DOE/LE edema - echo suggests apical hypokinesis however LVEF is preserved at 50-55% - nculear stress low risk, suggests mild apical and mid to basal  inferoseptal ischemia but mild/small areas. Overall no significant findings to suggest etiology of his symptoms - long time smoker, will order PFTs. May need to see pulm for adjustment of inhalers. If sees pulm would refer to Dr Elsworth Soho or Halford Chessman as he also needs eval for  sleep apnea, had prior appt with Dr Elsworth Soho but patient cancelled   F/u monitor, we had been asked to arrange for possible cardioembolic etiology for stroke.      Arnoldo Lenis, M.D.

## 2022-10-22 NOTE — Patient Instructions (Signed)
Medication Instructions:  Continue all current medications.  Labwork: none  Testing/Procedures: Your physician has recommended that you have a pulmonary function test. Pulmonary Function Tests are a group of tests that measure how well air moves in and out of your lungs.  Office will contact with results via phone, letter or mychart.     Follow-Up: 6 months   Any Other Special Instructions Will Be Listed Below (If Applicable).   If you need a refill on your cardiac medications before your next appointment, please call your pharmacy.

## 2022-10-23 ENCOUNTER — Inpatient Hospital Stay: Payer: Medicare HMO

## 2022-10-23 VITALS — BP 97/69 | HR 128 | Temp 99.9°F | Resp 20

## 2022-10-23 DIAGNOSIS — C9000 Multiple myeloma not having achieved remission: Secondary | ICD-10-CM

## 2022-10-23 DIAGNOSIS — E538 Deficiency of other specified B group vitamins: Secondary | ICD-10-CM | POA: Diagnosis not present

## 2022-10-23 DIAGNOSIS — Z992 Dependence on renal dialysis: Secondary | ICD-10-CM | POA: Diagnosis not present

## 2022-10-23 DIAGNOSIS — N186 End stage renal disease: Secondary | ICD-10-CM | POA: Diagnosis not present

## 2022-10-23 LAB — PROTEIN ELECTROPHORESIS, SERUM
A/G Ratio: 1.2 (ref 0.7–1.7)
Albumin ELP: 3.4 g/dL (ref 2.9–4.4)
Alpha-1-Globulin: 0.3 g/dL (ref 0.0–0.4)
Alpha-2-Globulin: 1 g/dL (ref 0.4–1.0)
Beta Globulin: 0.8 g/dL (ref 0.7–1.3)
Gamma Globulin: 0.7 g/dL (ref 0.4–1.8)
Globulin, Total: 2.8 g/dL (ref 2.2–3.9)
M-Spike, %: 0.2 g/dL — ABNORMAL HIGH
Total Protein ELP: 6.2 g/dL (ref 6.0–8.5)

## 2022-10-23 MED ORDER — CYANOCOBALAMIN 1000 MCG/ML IJ SOLN
1000.0000 ug | Freq: Once | INTRAMUSCULAR | Status: AC
  Administered 2022-10-23: 1000 ug via INTRAMUSCULAR
  Filled 2022-10-23: qty 1

## 2022-10-23 NOTE — Patient Instructions (Signed)
[facility]  Discharge Instructions: Thank you for choosing [facility] to provide your oncology and hematology care.  If you have a lab appointment with the Cancer Center, please come in thru the Main Entrance and check in at the main information desk.  Wear comfortable clothing and clothing appropriate for easy access to any Portacath or PICC line.   We strive to give you quality time with your provider. You may need to reschedule your appointment if you arrive late (15 or more minutes).  Arriving late affects you and other patients whose appointments are after yours.  Also, if you miss three or more appointments without notifying the office, you may be dismissed from the clinic at the provider's discretion.      For prescription refill requests, have your pharmacy contact our office and allow 72 hours for refills to be completed.    Today you received the following chemotherapy and/or immunotherapy agents B12      To help prevent nausea and vomiting after your treatment, we encourage you to take your nausea medication as directed.  BELOW ARE SYMPTOMS THAT SHOULD BE REPORTED IMMEDIATELY: *FEVER GREATER THAN 100.4 F (38 C) OR HIGHER *CHILLS OR SWEATING *NAUSEA AND VOMITING THAT IS NOT CONTROLLED WITH YOUR NAUSEA MEDICATION *UNUSUAL SHORTNESS OF BREATH *UNUSUAL BRUISING OR BLEEDING *URINARY PROBLEMS (pain or burning when urinating, or frequent urination) *BOWEL PROBLEMS (unusual diarrhea, constipation, pain near the anus) TENDERNESS IN MOUTH AND THROAT WITH OR WITHOUT PRESENCE OF ULCERS (sore throat, sores in mouth, or a toothache) UNUSUAL RASH, SWELLING OR PAIN  UNUSUAL VAGINAL DISCHARGE OR ITCHING   Items with * indicate a potential emergency and should be followed up as soon as possible or go to the Emergency Department if any problems should occur.  Please show the CHEMOTHERAPY ALERT CARD or IMMUNOTHERAPY ALERT CARD at check-in to the Emergency  Department and triage nurse.  Should you have questions after your visit or need to cancel or reschedule your appointment, please contact [facility] [phone]  and follow the prompts.  Office hours are 8:00 a.m. to 4:30 p.m. Monday - Friday. Please note that voicemails left after 4:00 p.m. may not be returned until the following business day.  We are closed weekends and major holidays. You have access to a nurse at all times for urgent questions. Please call the main number to the clinic [phone] and follow the prompts.  For any non-urgent questions, you may also contact your provider using MyChart. We now offer e-Visits for anyone 18 and older to request care online for non-urgent symptoms. For details visit [email or web address].   Also download the MyChart app! Go to the app store, search "MyChart", open the app, select [facility], and log in with your MyChart username and password.  Masks are optional in the cancer centers. If you would like for your care team to wear a mask while they are taking care of you, please let them know. You may have one support person who is at least 65 years old accompany you for your appointments.  

## 2022-10-23 NOTE — Progress Notes (Signed)
Ernest Barnett presents today for B12 injection per the provider's orders.  Stable during administration without incident; injection site WNL; see MAR for injection details.  Patient tolerated procedure well and without incident.  No questions or complaints noted at this time.

## 2022-10-24 DIAGNOSIS — N186 End stage renal disease: Secondary | ICD-10-CM | POA: Diagnosis not present

## 2022-10-24 DIAGNOSIS — Z992 Dependence on renal dialysis: Secondary | ICD-10-CM | POA: Diagnosis not present

## 2022-10-25 DIAGNOSIS — Z992 Dependence on renal dialysis: Secondary | ICD-10-CM | POA: Diagnosis not present

## 2022-10-25 DIAGNOSIS — N186 End stage renal disease: Secondary | ICD-10-CM | POA: Diagnosis not present

## 2022-10-28 DIAGNOSIS — Z992 Dependence on renal dialysis: Secondary | ICD-10-CM | POA: Diagnosis not present

## 2022-10-28 DIAGNOSIS — N186 End stage renal disease: Secondary | ICD-10-CM | POA: Diagnosis not present

## 2022-10-29 ENCOUNTER — Inpatient Hospital Stay (HOSPITAL_COMMUNITY): Payer: Medicare HMO | Admitting: Anesthesiology

## 2022-10-29 ENCOUNTER — Other Ambulatory Visit: Payer: Self-pay

## 2022-10-29 ENCOUNTER — Emergency Department (HOSPITAL_COMMUNITY): Payer: Medicare HMO

## 2022-10-29 ENCOUNTER — Encounter (HOSPITAL_COMMUNITY)
Admission: EM | Disposition: A | Discharge status: Other Acute Inpatient Hospital | Payer: Self-pay | Source: Home / Self Care | Attending: Pulmonary Disease

## 2022-10-29 ENCOUNTER — Inpatient Hospital Stay (HOSPITAL_COMMUNITY): Payer: Medicare HMO

## 2022-10-29 ENCOUNTER — Encounter (HOSPITAL_COMMUNITY): Payer: Self-pay

## 2022-10-29 ENCOUNTER — Inpatient Hospital Stay (HOSPITAL_COMMUNITY)
Admission: EM | Admit: 2022-10-29 | Discharge: 2022-11-08 | DRG: 853 | Disposition: A | Discharge status: Other Acute Inpatient Hospital | Payer: Medicare HMO | Attending: Internal Medicine | Admitting: Internal Medicine

## 2022-10-29 DIAGNOSIS — R109 Unspecified abdominal pain: Secondary | ICD-10-CM | POA: Diagnosis not present

## 2022-10-29 DIAGNOSIS — R509 Fever, unspecified: Secondary | ICD-10-CM | POA: Diagnosis not present

## 2022-10-29 DIAGNOSIS — N200 Calculus of kidney: Secondary | ICD-10-CM | POA: Diagnosis not present

## 2022-10-29 DIAGNOSIS — F17290 Nicotine dependence, other tobacco product, uncomplicated: Secondary | ICD-10-CM | POA: Diagnosis present

## 2022-10-29 DIAGNOSIS — J9601 Acute respiratory failure with hypoxia: Secondary | ICD-10-CM | POA: Diagnosis present

## 2022-10-29 DIAGNOSIS — R000 Tachycardia, unspecified: Secondary | ICD-10-CM | POA: Diagnosis not present

## 2022-10-29 DIAGNOSIS — N132 Hydronephrosis with renal and ureteral calculous obstruction: Secondary | ICD-10-CM | POA: Diagnosis present

## 2022-10-29 DIAGNOSIS — I10 Essential (primary) hypertension: Secondary | ICD-10-CM

## 2022-10-29 DIAGNOSIS — I120 Hypertensive chronic kidney disease with stage 5 chronic kidney disease or end stage renal disease: Secondary | ICD-10-CM | POA: Diagnosis present

## 2022-10-29 DIAGNOSIS — G9341 Metabolic encephalopathy: Secondary | ICD-10-CM | POA: Diagnosis not present

## 2022-10-29 DIAGNOSIS — Z801 Family history of malignant neoplasm of trachea, bronchus and lung: Secondary | ICD-10-CM

## 2022-10-29 DIAGNOSIS — Z515 Encounter for palliative care: Secondary | ICD-10-CM | POA: Diagnosis not present

## 2022-10-29 DIAGNOSIS — D696 Thrombocytopenia, unspecified: Secondary | ICD-10-CM | POA: Diagnosis not present

## 2022-10-29 DIAGNOSIS — D649 Anemia, unspecified: Secondary | ICD-10-CM | POA: Diagnosis not present

## 2022-10-29 DIAGNOSIS — E7800 Pure hypercholesterolemia, unspecified: Secondary | ICD-10-CM | POA: Diagnosis present

## 2022-10-29 DIAGNOSIS — Z992 Dependence on renal dialysis: Secondary | ICD-10-CM

## 2022-10-29 DIAGNOSIS — C9000 Multiple myeloma not having achieved remission: Secondary | ICD-10-CM | POA: Diagnosis not present

## 2022-10-29 DIAGNOSIS — Z79899 Other long term (current) drug therapy: Secondary | ICD-10-CM

## 2022-10-29 DIAGNOSIS — Z66 Do not resuscitate: Secondary | ICD-10-CM | POA: Diagnosis not present

## 2022-10-29 DIAGNOSIS — E662 Morbid (severe) obesity with alveolar hypoventilation: Secondary | ICD-10-CM | POA: Diagnosis present

## 2022-10-29 DIAGNOSIS — J690 Pneumonitis due to inhalation of food and vomit: Secondary | ICD-10-CM | POA: Diagnosis present

## 2022-10-29 DIAGNOSIS — D631 Anemia in chronic kidney disease: Secondary | ICD-10-CM | POA: Diagnosis not present

## 2022-10-29 DIAGNOSIS — E876 Hypokalemia: Secondary | ICD-10-CM | POA: Diagnosis present

## 2022-10-29 DIAGNOSIS — C9010 Plasma cell leukemia not having achieved remission: Secondary | ICD-10-CM

## 2022-10-29 DIAGNOSIS — A419 Sepsis, unspecified organism: Secondary | ICD-10-CM | POA: Diagnosis not present

## 2022-10-29 DIAGNOSIS — D6959 Other secondary thrombocytopenia: Secondary | ICD-10-CM | POA: Diagnosis present

## 2022-10-29 DIAGNOSIS — R6521 Severe sepsis with septic shock: Secondary | ICD-10-CM | POA: Diagnosis not present

## 2022-10-29 DIAGNOSIS — N138 Other obstructive and reflux uropathy: Secondary | ICD-10-CM

## 2022-10-29 DIAGNOSIS — Z87891 Personal history of nicotine dependence: Secondary | ICD-10-CM

## 2022-10-29 DIAGNOSIS — Z781 Physical restraint status: Secondary | ICD-10-CM

## 2022-10-29 DIAGNOSIS — J449 Chronic obstructive pulmonary disease, unspecified: Secondary | ICD-10-CM

## 2022-10-29 DIAGNOSIS — D72829 Elevated white blood cell count, unspecified: Secondary | ICD-10-CM | POA: Diagnosis not present

## 2022-10-29 DIAGNOSIS — N186 End stage renal disease: Secondary | ICD-10-CM | POA: Diagnosis not present

## 2022-10-29 DIAGNOSIS — Z1152 Encounter for screening for COVID-19: Secondary | ICD-10-CM

## 2022-10-29 DIAGNOSIS — Z466 Encounter for fitting and adjustment of urinary device: Secondary | ICD-10-CM

## 2022-10-29 DIAGNOSIS — R0902 Hypoxemia: Secondary | ICD-10-CM | POA: Diagnosis not present

## 2022-10-29 DIAGNOSIS — A4189 Other specified sepsis: Principal | ICD-10-CM | POA: Diagnosis present

## 2022-10-29 DIAGNOSIS — E2740 Unspecified adrenocortical insufficiency: Secondary | ICD-10-CM | POA: Diagnosis not present

## 2022-10-29 DIAGNOSIS — J101 Influenza due to other identified influenza virus with other respiratory manifestations: Secondary | ICD-10-CM | POA: Diagnosis not present

## 2022-10-29 DIAGNOSIS — E559 Vitamin D deficiency, unspecified: Secondary | ICD-10-CM | POA: Diagnosis present

## 2022-10-29 DIAGNOSIS — D539 Nutritional anemia, unspecified: Secondary | ICD-10-CM | POA: Diagnosis not present

## 2022-10-29 DIAGNOSIS — C9200 Acute myeloblastic leukemia, not having achieved remission: Secondary | ICD-10-CM | POA: Diagnosis not present

## 2022-10-29 DIAGNOSIS — I959 Hypotension, unspecified: Secondary | ICD-10-CM | POA: Diagnosis not present

## 2022-10-29 DIAGNOSIS — D61818 Other pancytopenia: Secondary | ICD-10-CM | POA: Diagnosis not present

## 2022-10-29 DIAGNOSIS — J384 Edema of larynx: Secondary | ICD-10-CM | POA: Diagnosis present

## 2022-10-29 DIAGNOSIS — I69354 Hemiplegia and hemiparesis following cerebral infarction affecting left non-dominant side: Secondary | ICD-10-CM | POA: Diagnosis not present

## 2022-10-29 DIAGNOSIS — J1000 Influenza due to other identified influenza virus with unspecified type of pneumonia: Secondary | ICD-10-CM | POA: Diagnosis present

## 2022-10-29 DIAGNOSIS — Z743 Need for continuous supervision: Secondary | ICD-10-CM | POA: Diagnosis not present

## 2022-10-29 DIAGNOSIS — K219 Gastro-esophageal reflux disease without esophagitis: Secondary | ICD-10-CM | POA: Diagnosis present

## 2022-10-29 DIAGNOSIS — E860 Dehydration: Secondary | ICD-10-CM | POA: Diagnosis not present

## 2022-10-29 DIAGNOSIS — Z9081 Acquired absence of spleen: Secondary | ICD-10-CM

## 2022-10-29 DIAGNOSIS — N1330 Unspecified hydronephrosis: Secondary | ICD-10-CM | POA: Diagnosis not present

## 2022-10-29 DIAGNOSIS — Z91199 Patient's noncompliance with other medical treatment and regimen due to unspecified reason: Secondary | ICD-10-CM

## 2022-10-29 DIAGNOSIS — R6520 Severe sepsis without septic shock: Secondary | ICD-10-CM | POA: Diagnosis not present

## 2022-10-29 DIAGNOSIS — J9690 Respiratory failure, unspecified, unspecified whether with hypoxia or hypercapnia: Secondary | ICD-10-CM | POA: Diagnosis not present

## 2022-10-29 DIAGNOSIS — N281 Cyst of kidney, acquired: Secondary | ICD-10-CM | POA: Diagnosis not present

## 2022-10-29 DIAGNOSIS — Z6841 Body Mass Index (BMI) 40.0 and over, adult: Secondary | ICD-10-CM | POA: Diagnosis not present

## 2022-10-29 DIAGNOSIS — C9500 Acute leukemia of unspecified cell type not having achieved remission: Secondary | ICD-10-CM

## 2022-10-29 DIAGNOSIS — Z7982 Long term (current) use of aspirin: Secondary | ICD-10-CM

## 2022-10-29 DIAGNOSIS — R4182 Altered mental status, unspecified: Secondary | ICD-10-CM | POA: Diagnosis not present

## 2022-10-29 DIAGNOSIS — J80 Acute respiratory distress syndrome: Secondary | ICD-10-CM | POA: Diagnosis not present

## 2022-10-29 DIAGNOSIS — Z7951 Long term (current) use of inhaled steroids: Secondary | ICD-10-CM

## 2022-10-29 DIAGNOSIS — H9193 Unspecified hearing loss, bilateral: Secondary | ICD-10-CM | POA: Diagnosis present

## 2022-10-29 DIAGNOSIS — N201 Calculus of ureter: Secondary | ICD-10-CM | POA: Diagnosis not present

## 2022-10-29 DIAGNOSIS — Z8719 Personal history of other diseases of the digestive system: Secondary | ICD-10-CM | POA: Diagnosis not present

## 2022-10-29 HISTORY — PX: CYSTOSCOPY WITH RETROGRADE PYELOGRAM, URETEROSCOPY AND STENT PLACEMENT: SHX5789

## 2022-10-29 LAB — CBC WITH DIFFERENTIAL/PLATELET
Abs Immature Granulocytes: 0 10*3/uL (ref 0.00–0.07)
Basophils Absolute: 0 10*3/uL (ref 0.0–0.1)
Basophils Relative: 0 %
Blasts: 7 %
Eosinophils Absolute: 0 10*3/uL (ref 0.0–0.5)
Eosinophils Relative: 0 %
HCT: 18.4 % — ABNORMAL LOW (ref 39.0–52.0)
Hemoglobin: 6 g/dL — CL (ref 13.0–17.0)
Lymphocytes Relative: 87 %
Lymphs Abs: 30.9 10*3/uL — ABNORMAL HIGH (ref 0.7–4.0)
MCH: 36.1 pg — ABNORMAL HIGH (ref 26.0–34.0)
MCHC: 32.6 g/dL (ref 30.0–36.0)
MCV: 110.8 fL — ABNORMAL HIGH (ref 80.0–100.0)
Monocytes Absolute: 1.8 10*3/uL — ABNORMAL HIGH (ref 0.1–1.0)
Monocytes Relative: 5 %
Neutro Abs: 0.4 10*3/uL — CL (ref 1.7–7.7)
Neutrophils Relative %: 1 %
Platelets: 27 10*3/uL — CL (ref 150–400)
RBC: 1.66 MIL/uL — ABNORMAL LOW (ref 4.22–5.81)
RDW: 25.5 % — ABNORMAL HIGH (ref 11.5–15.5)
WBC: 35.5 10*3/uL — ABNORMAL HIGH (ref 4.0–10.5)
nRBC: 0.9 % — ABNORMAL HIGH (ref 0.0–0.2)

## 2022-10-29 LAB — URINALYSIS, ROUTINE W REFLEX MICROSCOPIC
Bacteria, UA: NONE SEEN
Bilirubin Urine: NEGATIVE
Glucose, UA: 50 mg/dL — AB
Ketones, ur: NEGATIVE mg/dL
Leukocytes,Ua: NEGATIVE
Nitrite: NEGATIVE
Protein, ur: 100 mg/dL — AB
Specific Gravity, Urine: 1.013 (ref 1.005–1.030)
pH: 7 (ref 5.0–8.0)

## 2022-10-29 LAB — LACTIC ACID, PLASMA
Lactic Acid, Venous: 1.4 mmol/L (ref 0.5–1.9)
Lactic Acid, Venous: 0.9 mmol/L (ref 0.5–1.9)

## 2022-10-29 LAB — RETICULOCYTES
Immature Retic Fract: 2.6 % (ref 2.3–15.9)
RBC.: 1.95 MIL/uL — ABNORMAL LOW (ref 4.22–5.81)
Retic Count, Absolute: 30.2 10*3/uL (ref 19.0–186.0)
Retic Ct Pct: 1.6 % (ref 0.4–3.1)

## 2022-10-29 LAB — COMPREHENSIVE METABOLIC PANEL
ALT: 21 U/L (ref 0–44)
AST: 22 U/L (ref 15–41)
Albumin: 3.2 g/dL — ABNORMAL LOW (ref 3.5–5.0)
Alkaline Phosphatase: 28 U/L — ABNORMAL LOW (ref 38–126)
Anion gap: 10 (ref 5–15)
BUN: 44 mg/dL — ABNORMAL HIGH (ref 8–23)
CO2: 23 mmol/L (ref 22–32)
Calcium: 8.6 mg/dL — ABNORMAL LOW (ref 8.9–10.3)
Chloride: 108 mmol/L (ref 98–111)
Creatinine, Ser: 7.08 mg/dL — ABNORMAL HIGH (ref 0.61–1.24)
GFR, Estimated: 8 mL/min — ABNORMAL LOW (ref >60)
Glucose, Bld: 113 mg/dL — ABNORMAL HIGH (ref 70–99)
Potassium: 3.5 mmol/L (ref 3.5–5.1)
Sodium: 141 mmol/L (ref 135–145)
Total Bilirubin: 0.6 mg/dL (ref 0.3–1.2)
Total Protein: 6.6 g/dL (ref 6.5–8.1)

## 2022-10-29 LAB — POCT I-STAT EG7
Acid-base deficit: 4 mmol/L — ABNORMAL HIGH (ref 0.0–2.0)
Bicarbonate: 23.2 mmol/L (ref 20.0–28.0)
Calcium, Ion: 1.2 mmol/L (ref 1.15–1.40)
HCT: 23 % — ABNORMAL LOW (ref 39.0–52.0)
Hemoglobin: 7.8 g/dL — ABNORMAL LOW (ref 13.0–17.0)
O2 Saturation: 78 %
Potassium: 4.3 mmol/L (ref 3.5–5.1)
Sodium: 141 mmol/L (ref 135–145)
TCO2: 25 mmol/L (ref 22–32)
pCO2, Ven: 52.5 mmHg (ref 44–60)
pH, Ven: 7.253 (ref 7.25–7.43)
pO2, Ven: 49 mmHg — ABNORMAL HIGH (ref 32–45)

## 2022-10-29 LAB — RESP PANEL BY RT-PCR (RSV, FLU A&B, COVID)  RVPGX2
Influenza A by PCR: POSITIVE — AB
Influenza B by PCR: NEGATIVE
Resp Syncytial Virus by PCR: NEGATIVE
SARS Coronavirus 2 by RT PCR: NEGATIVE

## 2022-10-29 LAB — LACTATE DEHYDROGENASE: LDH: 586 U/L — ABNORMAL HIGH (ref 98–192)

## 2022-10-29 LAB — PREPARE RBC (CROSSMATCH)

## 2022-10-29 LAB — PROTIME-INR
INR: 1.3 — ABNORMAL HIGH (ref 0.8–1.2)
Prothrombin Time: 16.4 seconds — ABNORMAL HIGH (ref 11.4–15.2)

## 2022-10-29 SURGERY — CYSTOURETEROSCOPY, WITH RETROGRADE PYELOGRAM AND STENT INSERTION
Anesthesia: General | Laterality: Right

## 2022-10-29 MED ORDER — IOHEXOL 300 MG/ML  SOLN
INTRAMUSCULAR | Status: DC | PRN
  Administered 2022-10-29: 5 mL via INTRATHECAL

## 2022-10-29 MED ORDER — HALOPERIDOL LACTATE 5 MG/ML IJ SOLN
5.0000 mg | Freq: Once | INTRAMUSCULAR | Status: DC
  Filled 2022-10-29: qty 1

## 2022-10-29 MED ORDER — PROPOFOL 1000 MG/100ML IV EMUL
5.0000 ug/kg/min | INTRAVENOUS | Status: DC
  Administered 2022-10-30 (×2): 50 ug/kg/min via INTRAVENOUS
  Administered 2022-10-30: 40 ug/kg/min via INTRAVENOUS
  Filled 2022-10-29 (×3): qty 100

## 2022-10-29 MED ORDER — VANCOMYCIN HCL IN DEXTROSE 1-5 GM/200ML-% IV SOLN
1000.0000 mg | Freq: Once | INTRAVENOUS | Status: AC
  Administered 2022-10-29: 1000 mg via INTRAVENOUS
  Filled 2022-10-29: qty 200

## 2022-10-29 MED ORDER — ONDANSETRON HCL 4 MG/2ML IJ SOLN
INTRAMUSCULAR | Status: AC
  Filled 2022-10-29: qty 2

## 2022-10-29 MED ORDER — ROCURONIUM 10MG/ML (10ML) SYRINGE FOR MEDFUSION PUMP - OPTIME
INTRAVENOUS | Status: DC | PRN
  Administered 2022-10-29: 50 mg via INTRAVENOUS

## 2022-10-29 MED ORDER — SODIUM CHLORIDE 0.9 % IV SOLN
2.0000 g | Freq: Once | INTRAVENOUS | Status: AC
  Administered 2022-10-29: 2 g via INTRAVENOUS
  Filled 2022-10-29: qty 12.5

## 2022-10-29 MED ORDER — HALOPERIDOL LACTATE 5 MG/ML IJ SOLN
2.0000 mg | Freq: Once | INTRAMUSCULAR | Status: AC
  Administered 2022-10-29: 2 mg via INTRAVENOUS
  Filled 2022-10-29: qty 1

## 2022-10-29 MED ORDER — SUCCINYLCHOLINE 20MG/ML (10ML) SYRINGE FOR MEDFUSION PUMP - OPTIME
INTRAMUSCULAR | Status: DC | PRN
  Administered 2022-10-29: 120 mg via INTRAVENOUS

## 2022-10-29 MED ORDER — SODIUM CHLORIDE 0.9 % IV SOLN: INTRAVENOUS | Status: DC | PRN

## 2022-10-29 MED ORDER — LACTATED RINGERS IV BOLUS
1000.0000 mL | Freq: Once | INTRAVENOUS | Status: AC
  Administered 2022-10-29: 1000 mL via INTRAVENOUS

## 2022-10-29 MED ORDER — PROPOFOL 10 MG/ML IV BOLUS
INTRAVENOUS | Status: AC
  Filled 2022-10-29: qty 20

## 2022-10-29 MED ORDER — LACTATED RINGERS IV BOLUS
2000.0000 mL | Freq: Once | INTRAVENOUS | Status: AC
  Administered 2022-10-29: 2000 mL via INTRAVENOUS

## 2022-10-29 MED ORDER — ACETAMINOPHEN 120 MG RE SUPP
RECTAL | Status: AC
  Filled 2022-10-29: qty 1

## 2022-10-29 MED ORDER — PHENYLEPHRINE HCL (PRESSORS) 10 MG/ML IV SOLN
INTRAVENOUS | Status: DC | PRN
  Administered 2022-10-29 (×2): 160 ug via INTRAVENOUS

## 2022-10-29 MED ORDER — LIDOCAINE 2% (20 MG/ML) 5 ML SYRINGE
INTRAMUSCULAR | Status: AC
  Filled 2022-10-29: qty 5

## 2022-10-29 MED ORDER — PROPOFOL 10 MG/ML IV BOLUS
INTRAVENOUS | Status: DC | PRN
  Administered 2022-10-29: 130 mg via INTRAVENOUS

## 2022-10-29 MED ORDER — SUCCINYLCHOLINE CHLORIDE 200 MG/10ML IV SOSY
PREFILLED_SYRINGE | INTRAVENOUS | Status: AC
  Filled 2022-10-29: qty 10

## 2022-10-29 MED ORDER — FENTANYL CITRATE PF 50 MCG/ML IJ SOSY
25.0000 ug | PREFILLED_SYRINGE | INTRAMUSCULAR | Status: DC | PRN
  Administered 2022-10-29 – 2022-11-04 (×2): 25 ug via INTRAVENOUS
  Filled 2022-10-29 (×2): qty 1

## 2022-10-29 MED ORDER — LORAZEPAM 2 MG/ML IJ SOLN
2.0000 mg | Freq: Once | INTRAMUSCULAR | Status: AC
  Administered 2022-10-29: 2 mg via INTRAVENOUS
  Filled 2022-10-29: qty 1

## 2022-10-29 MED ORDER — LIDOCAINE HCL (CARDIAC) PF 100 MG/5ML IV SOSY
PREFILLED_SYRINGE | INTRAVENOUS | Status: DC | PRN
  Administered 2022-10-29: 40 mg via INTRAVENOUS

## 2022-10-29 MED ORDER — DOCUSATE SODIUM 100 MG PO CAPS: 100.0000 mg | ORAL_CAPSULE | Freq: Two times a day (BID) | ORAL | Status: DC | PRN

## 2022-10-29 MED ORDER — PROPOFOL 1000 MG/100ML IV EMUL
INTRAVENOUS | Status: AC
  Filled 2022-10-29: qty 100

## 2022-10-29 MED ORDER — ACETAMINOPHEN 650 MG RE SUPP
650.0000 mg | Freq: Once | RECTAL | Status: AC
  Administered 2022-10-29: 650 mg via RECTAL
  Filled 2022-10-29: qty 1

## 2022-10-29 MED ORDER — SODIUM CHLORIDE 0.9 % IV SOLN
2.0000 g | INTRAVENOUS | Status: DC
  Administered 2022-10-29: 2 g via INTRAVENOUS
  Filled 2022-10-29: qty 20

## 2022-10-29 MED ORDER — ROCURONIUM BROMIDE 10 MG/ML (PF) SYRINGE
PREFILLED_SYRINGE | INTRAVENOUS | Status: AC
  Filled 2022-10-29: qty 10

## 2022-10-29 MED ORDER — OSELTAMIVIR PHOSPHATE 30 MG PO CAPS
30.0000 mg | ORAL_CAPSULE | ORAL | Status: DC
  Filled 2022-10-29: qty 1

## 2022-10-29 MED ORDER — FENTANYL CITRATE (PF) 250 MCG/5ML IJ SOLN
INTRAMUSCULAR | Status: AC
  Filled 2022-10-29: qty 5

## 2022-10-29 MED ORDER — IOHEXOL 300 MG/ML  SOLN: 100.0000 mL | Freq: Once | INTRAMUSCULAR | Status: DC | PRN

## 2022-10-29 MED ORDER — LACTATED RINGERS IV SOLN: INTRAVENOUS | Status: DC | PRN

## 2022-10-29 MED ORDER — PROPOFOL 500 MG/50ML IV EMUL
INTRAVENOUS | Status: DC | PRN
  Administered 2022-10-29: 50 ug/kg/min via INTRAVENOUS

## 2022-10-29 MED ORDER — DEXAMETHASONE SODIUM PHOSPHATE 10 MG/ML IJ SOLN
INTRAMUSCULAR | Status: AC
  Filled 2022-10-29: qty 1

## 2022-10-29 MED ORDER — SODIUM CHLORIDE 0.9% IV SOLUTION: Freq: Once | INTRAVENOUS | Status: AC

## 2022-10-29 MED ORDER — POLYETHYLENE GLYCOL 3350 17 G PO PACK: 17.0000 g | PACK | Freq: Every day | ORAL | Status: DC | PRN

## 2022-10-29 SURGICAL SUPPLY — 15 items
BAG COUNTER SPONGE SURGICOUNT (BAG) ×1 IMPLANT
BAG URO CATCHER STRL LF (MISCELLANEOUS) ×1 IMPLANT
CATH URET DUAL LUMEN 6-10FR 50 (CATHETERS) IMPLANT
GLOVE BIO SURGEON STRL SZ7.5 (GLOVE) ×1 IMPLANT
GOWN STRL REUS W/ TWL XL LVL3 (GOWN DISPOSABLE) ×1 IMPLANT
GOWN STRL REUS W/TWL XL LVL3 (GOWN DISPOSABLE) ×1
GUIDEWIRE ANG ZIPWIRE 038X150 (WIRE) IMPLANT
GUIDEWIRE STR DUAL SENSOR (WIRE) ×1 IMPLANT
IV NS IRRIG 3000ML ARTHROMATIC (IV SOLUTION) ×2 IMPLANT
KIT TURNOVER KIT B (KITS) ×1 IMPLANT
MANIFOLD NEPTUNE II (INSTRUMENTS) ×1 IMPLANT
NS IRRIG 1000ML POUR BTL (IV SOLUTION) ×1 IMPLANT
PACK CYSTO (CUSTOM PROCEDURE TRAY) ×1 IMPLANT
SET IRRIG Y TYPE TUR BLADDER L (SET/KITS/TRAYS/PACK) ×1 IMPLANT
SOL PREP POV-IOD 4OZ 10% (MISCELLANEOUS) ×1 IMPLANT

## 2022-10-29 NOTE — Anesthesia Preprocedure Evaluation (Addendum)
Anesthesia Evaluation  Patient identified by MRN, date of birth, ID band Patient confused  General Assessment Comment:History noted Dr. Nyoka Cowden  Reviewed: Allergy & Precautions, NPO status , Patient's Chart, lab work & pertinent test results  Airway Mallampati: II       Dental   Pulmonary COPD, former smoker    + wheezing      Cardiovascular hypertension,  Rhythm:Regular Rate:Normal     Neuro/Psych  Neuromuscular disease CVA    GI/Hepatic Neg liver ROS,GERD  ,,  Endo/Other    Renal/GU Renal disease     Musculoskeletal   Abdominal   Peds  Hematology   Anesthesia Other Findings   Reproductive/Obstetrics                             Anesthesia Physical Anesthesia Plan  ASA: 3  Anesthesia Plan: General   Post-op Pain Management:    Induction: Inhalational  PONV Risk Score and Plan: Treatment may vary due to age or medical condition, Ondansetron, Dexamethasone and Midazolam  Airway Management Planned: Oral ETT  Additional Equipment:   Intra-op Plan:   Post-operative Plan: Possible Post-op intubation/ventilation  Informed Consent: I have reviewed the patients History and Physical, chart, labs and discussed the procedure including the risks, benefits and alternatives for the proposed anesthesia with the patient or authorized representative who has indicated his/her understanding and acceptance.     Dental advisory given  Plan Discussed with: CRNA and Anesthesiologist  Anesthesia Plan Comments:        Anesthesia Quick Evaluation

## 2022-10-29 NOTE — ED Triage Notes (Signed)
[facility] [place] going to the bathroom wife called EMS has a fever of 103.3 with EMS.

## 2022-10-29 NOTE — Consult Note (Signed)
Urology Consult   Physician requesting consult: Jacky Kindle, MD  Reason for consult: nephrolithiasis  History of Present Illness: Ernest Barnett is a 66 y.o. male with a PMH of ESRD on peritoneal dialysis, COPD, CVA, GERD, HLD, HTN, stroke, and multiple myeloma who presented as a transfer from an OSH with sepsis and a right obstructing distal ureteral stone. History is primarily obtained from Chart Review and patient's daughter, who accompanies him at the bedside. From her report, patient was in his usual state of health until yesterday evening when he became altered and febrile. She also notes a cough and some congestion over the preceding 3 days. He was brought to [facility] for evaluation of his altered mental status.  In the ED, patient was febrile to 103.3, tachycardic to the 130s, with BP from 90-130/60-110s. He is satting in the mid to low 90s on 3L [place]. He is grossly confused and not oriented or arousable. Labs are notable for WBC 35.5, Hgb 6.0. Cr 7.08--daughter believes he got PD yesterday evening. Resp panel + influenza. CT head was negative for acute intracranial abnormalities. CXR noted some minimal patchy density at the lung bases without dense consolidations. CT A/P with mild right hydronephrosis proximal to a 30m distal right ureteral stone. Also with lower lobe GGOs. No urine has been collected. Daughter is unsure how much urine he makes at baseline, but on CT his bladder is relatively full.    Past Medical History:  Diagnosis Date   Chronic kidney disease    COPD (chronic obstructive pulmonary disease) (HCC)    [place]   Cystoid macular edema of left eye 03/07/2021   GERD (gastroesophageal reflux disease)    HOH (hard of hearing)    Hypercholesteremia    Hypertension    Left epiretinal membrane 03/07/2021   Pseudophakia 03/07/2021   [place]   left sided weakness   Vitamin D deficiency     Past Surgical History:  Procedure  Laterality Date   A/V FISTULAGRAM Right 01/29/2022   Procedure: A/V Fistulagram;  Surgeon: BSerafina Mitchell MD;  [facility];  Service: Cardiovascular;  Laterality: Right;   AV FISTULA PLACEMENT Right 02/08/2021   Procedure: RIGHT ARM ARTERIOVENOUS (AV) FISTULA CREATION;  Surgeon: ERosetta Posner MD;  Location: [facility];  Service: Vascular;  Laterality: Right;   BIOPSY  11/12/2018   Procedure: BIOPSY;  Surgeon: RRogene Houston MD;  Location: [facility];  Service: Endoscopy;;  colon   BIOPSY  05/10/2020   Procedure: BIOPSY;  Surgeon: RRogene Houston MD;  Location: [facility];  Service: Endoscopy;;  esophagus   CATARACT EXTRACTION     right eye   CATARACT EXTRACTION W/PHACO  10/08/2012   Procedure: CATARACT EXTRACTION PHACO AND INTRAOCULAR LENS PLACEMENT ([place];  Surgeon: KTonny Branch MD;  Location: [facility];  Service: Ophthalmology;  Laterality: Left;  CDE:6.64   [place]  COLONOSCOPY N/A 11/12/2018   Procedure: COLONOSCOPY;  Surgeon: RRogene Houston MD;  Location: [facility];  Service: Endoscopy;  Laterality: N/A;  1030   ELBOW FRACTURE SURGERY     left   ESOPHAGEAL DILATION N/A 11/12/2018   Procedure: ESOPHAGEAL DILATION;  Surgeon: RRogene Houston MD;  [facility];  Service: Endoscopy;  Laterality: N/A;   ESOPHAGEAL DILATION N/A 05/10/2020   Procedure: ESOPHAGEAL DILATION;  Surgeon: RRogene Houston MD;  [facility];  Service: Endoscopy;  Laterality: N/A;   ESOPHAGOGASTRODUODENOSCOPY N/A 11/12/2018   Procedure: ESOPHAGOGASTRODUODENOSCOPY (EGD);  Surgeon: Rogene Houston, MD;  [facility];  Service: Endoscopy;  Laterality: N/A;   ESOPHAGOGASTRODUODENOSCOPY N/A 05/10/2020   Procedure: ESOPHAGOGASTRODUODENOSCOPY (EGD);  Surgeon: Rogene Houston, MD;  [facility];  Service: Endoscopy;  Laterality: N/A;  210   HERNIA REPAIR     right inguinal   HYDROCELE EXCISION / REPAIR     IR FLUORO GUIDE CV LINE LEFT   08/17/2021   IR US GUIDE VASC ACCESS LEFT  08/17/2021   LIGATION OF COMPETING BRANCHES OF ARTERIOVENOUS FISTULA Right 09/11/2021   Procedure: LIGATION OF COMPETING BRANCHES OF RIGHT ARM ARTERIOVENOUS FISTULA;  Surgeon: [name], MD;  Location: [facility];  Service: Vascular;  Laterality: Right;   PERIPHERAL VASCULAR BALLOON ANGIOPLASTY Right 01/29/2022   Procedure: PERIPHERAL VASCULAR BALLOON ANGIOPLASTY;  Surgeon: Serafina Mitchell, MD;  Location: [facility];  Service: Cardiovascular;  Laterality: Right;  arm fistula   POLYPECTOMY  11/12/2018   Procedure: POLYPECTOMY;  Surgeon: Rogene Houston, MD;  [facility];  Service: Endoscopy;;  colon    RETINAL DETACHMENT SURGERY Left 2019   SPLENECTOMY, TOTAL     TONSILLECTOMY     VOCAL CORD INJECTION     removal of polyp-2005    Current Hospital Medications:  Home Meds:  Current Facility-Administered Medications on File Prior to Encounter  Medication Dose Route Frequency Provider Last Rate Last Admin   palonosetron (ALOXI) 0.25 MG/5ML injection            Current Outpatient Medications on File Prior to Encounter  Medication Sig Dispense Refill   acetaminophen-codeine (TYLENOL #3) 300-30 MG tablet Take 2 tablets by mouth every 8 (eight) hours as needed.     acyclovir (ZOVIRAX) 200 MG capsule Take 200 mg by mouth 2 (two) times daily.     albuterol (VENTOLIN HFA) 108 (90 Base) MCG/ACT inhaler Inhale 2 puffs into the lungs every 6 (six) hours as needed for wheezing or shortness of breath. 8 g 5   aspirin EC 81 MG tablet Take 1 tablet (81 mg total) by mouth daily. 30 tablet 11   bortezomib IV (VELCADE) 3.5 MG injection Inject 1.5 mg/m2 into the vein once a week.     budesonide-formoterol (SYMBICORT) 80-4.5 MCG/ACT inhaler Inhale 2 puffs into the lungs 2 (two) times daily as needed (shortness of breath). 10.2 each 5   cholecalciferol (VITAMIN D3) 25 MCG (1000 UNIT) tablet Take 1,000 Units by mouth daily.     cyanocobalamin  (VITAMIN B12) 1000 MCG tablet Take 1,000 mcg by mouth daily.     erythromycin ophthalmic ointment Place 1 Application into the right eye 3 (three) times daily.     gabapentin (NEURONTIN) 300 MG capsule Take 1 capsule by mouth twice daily 60 capsule 6   multivitamin (RENA-VIT) TABS tablet Take 1 tablet by mouth daily.     pantoprazole (PROTONIX) 40 MG tablet Take 1 tablet (40 mg total) by mouth daily. 90 tablet 3   potassium chloride SA (KLOR-CON M) 20 MEQ tablet Take 1 tablet (20 mEq total) by mouth 2 (two) times daily.     pravastatin (PRAVACHOL) 80 MG tablet Take 1 tablet (80 mg total) by mouth daily. 90 tablet 3   tamsulosin (FLOMAX) 0.4 MG CAPS capsule Take 1 capsule by mouth once daily 30 capsule 0   tiZANidine (ZANAFLEX) 2 MG tablet TAKE 1 TABLET BY MOUTH EVERY 6 HOURS AS  NEEDED FOR MUSCLE SPASM 30 tablet 0   torsemide (DEMADEX) 100 MG tablet Take 100 mg by mouth 2 (two) times daily.     dexamethasone (DECADRON) 4 MG tablet Take 2.5 tablets (10 mg total) by mouth once a week. Take at home along with Tylenol and Benadryl weekly on the day of daratumumab injection. (Patient not taking: Reported on 10/29/2022) 30 tablet 2   oxyCODONE (OXY IR/ROXICODONE) 5 MG immediate release tablet Take 1 tablet (5 mg total) by mouth 2 (two) times daily as needed for severe pain. (Patient not taking: Reported on 10/29/2022) 30 tablet 0   polyethylene glycol (MIRALAX / GLYCOLAX) packet Take 17 g by mouth daily. Patient states that he takes as needed. (Patient not taking: Reported on 10/29/2022) 30 each 5   senna (SENOKOT) 8.6 MG TABS tablet Take 2 tablets (17.2 mg total) by mouth daily. (Patient taking differently: Take 1 tablet by mouth daily.) 120 tablet 2   sildenafil (REVATIO) 20 MG tablet Take 1 tablet (20 mg total) by mouth 5 (five) times daily as needed. (Patient not taking: Reported on 10/29/2022) 30 tablet 2     Scheduled Meds:  acetaminophen       haloperidol lactate  5 mg Intramuscular Once    oseltamivir  30 mg Oral Q24H   Continuous Infusions:  cefTRIAXone (ROCEPHIN)  IV     lactated ringers     PRN Meds:.acetaminophen, docusate sodium, fentaNYL (SUBLIMAZE) injection, iohexol, polyethylene glycol  Allergies: No Known Allergies  History reviewed. No pertinent family history.  Social History:  reports that he has quit smoking. His smoking use included cigarettes. He has a 90.00 pack-year smoking history. He uses smokeless tobacco. He reports that he does not currently use alcohol after a past usage of about 1.0 - 2.0 standard drink of alcohol per week. He reports that he does not use drugs.  ROS: A complete review of systems was performed.  All systems are negative except for pertinent findings as noted.  Physical Exam:  Vital signs in last 24 hours: Temp:  [98.3 F (36.8 C)-103.3 F (39.6 C)] 98.6 F (37 C) (12/26 1714) Pulse Rate:  [97-134] 129 (12/26 1830) Resp:  [15-30] 23 (12/26 1714) BP: (97-134)/(62-114) 122/77 (12/26 1830) SpO2:  [91 %-98 %] 98 % (12/26 1830) Constitutional:  Chronically ill, not arousable, oriented x0 Cardiovascular: Sinus tachycardia on tele Respiratory: Increased WOB on 3L [place] GI: Abdomen is soft, nondistended, no abdominal masses GU: unable to assess CVA tenderness Lymphatic: No lymphadenopathy  Laboratory Data:  Recent Labs    10/29/22 1025  WBC 35.5*  HGB 6.0*  HCT 18.4*  PLT 27*    Recent Labs    10/29/22 1057  NA 141  K 3.5  CL 108  GLUCOSE 113*  BUN 44*  CALCIUM 8.6*  CREATININE 7.08*     Results for orders placed or performed during the hospital encounter of 10/29/22 (from the past 24 hour(s))  Resp panel by RT-PCR (RSV, Flu A&B, Covid) Anterior Nasal Swab     Status: Abnormal   Collection Time: 10/29/22 10:24 AM   Specimen: Anterior Nasal Swab  Result Value Ref Range   SARS Coronavirus 2 by RT PCR NEGATIVE NEGATIVE   Influenza A by PCR POSITIVE (A) NEGATIVE   Influenza B by PCR NEGATIVE NEGATIVE   Resp  Syncytial Virus by PCR NEGATIVE NEGATIVE  CBC with Differential     Status: Abnormal   Collection Time: 10/29/22 10:25 AM  Result Value Ref Range  WBC 35.5 (H) 4.0 - 10.5 K/uL   RBC 1.66 (L) 4.22 - 5.81 MIL/uL   Hemoglobin 6.0 (LL) 13.0 - 17.0 g/dL   HCT 18.4 (L) 39.0 - 52.0 %   MCV 110.8 (H) 80.0 - 100.0 fL   MCH 36.1 (H) 26.0 - 34.0 pg   MCHC 32.6 30.0 - 36.0 g/dL   RDW 25.5 (H) 11.5 - 15.5 %   Platelets 27 (LL) 150 - 400 K/uL   nRBC 0.9 (H) 0.0 - 0.2 %   Neutrophils Relative % 1 %   Neutro Abs 0.4 (LL) 1.7 - 7.7 K/uL   Lymphocytes Relative 87 %   Lymphs Abs 30.9 (H) 0.7 - 4.0 K/uL   Monocytes Relative 5 %   Monocytes Absolute 1.8 (H) 0.1 - 1.0 K/uL   Eosinophils Relative 0 %   Eosinophils Absolute 0.0 0.0 - 0.5 K/uL   Basophils Relative 0 %   Basophils Absolute 0.0 0.0 - 0.1 K/uL   WBC Morphology      MARKED LEFT SHIFT (>5% METAS,MYELOS AND PROS, OCC BLAST NOTED)   RBC Morphology FEW NRBCS    Smear Review PLATELET COUNT CONFIRMED BY SMEAR    Blasts 7 %   Abs Immature Granulocytes 0.00 0.00 - 0.07 K/uL   Reactive, Benign Lymphocytes PRESENT    Smudge Cells PRESENT    Polychromasia PRESENT   Lactic acid, plasma     Status: None   Collection Time: 10/29/22 10:25 AM  Result Value Ref Range   Lactic Acid, Venous 1.4 0.5 - 1.9 mmol/L  Comprehensive metabolic panel     Status: Abnormal   Collection Time: 10/29/22 10:57 AM  Result Value Ref Range   Sodium 141 135 - 145 mmol/L   Potassium 3.5 3.5 - 5.1 mmol/L   Chloride 108 98 - 111 mmol/L   CO2 23 22 - 32 mmol/L   Glucose, Bld 113 (H) 70 - 99 mg/dL   BUN 44 (H) 8 - 23 mg/dL   Creatinine, Ser 7.08 (H) 0.61 - 1.24 mg/dL   Calcium 8.6 (L) 8.9 - 10.3 mg/dL   Total Protein 6.6 6.5 - 8.1 g/dL   Albumin 3.2 (L) 3.5 - 5.0 g/dL   AST 22 15 - 41 U/L   ALT 21 0 - 44 U/L   Alkaline Phosphatase 28 (L) 38 - 126 U/L   Total Bilirubin 0.6 0.3 - 1.2 mg/dL   GFR, Estimated 8 (L) >60 mL/min   Anion gap 10 5 - 15  Lactic acid, plasma      Status: None   Collection Time: 10/29/22 12:51 PM  Result Value Ref Range   Lactic Acid, Venous 0.9 0.5 - 1.9 mmol/L  Type and screen [facility]     Status: None (Preliminary result)   Collection Time: 10/29/22 12:51 PM  Result Value Ref Range   ABO/RH(D) A POS    Antibody Screen NEG    Sample Expiration 11/01/2022,2359    Unit Number [id number]    Blood Component Type RED CELLS,LR    Unit division 00    Status of Unit ISSUED    Transfusion Status OK TO TRANSFUSE    Crossmatch Result      Compatible Performed at [facility], [address] [place], [place] [address]   Prepare RBC (crossmatch)     Status: None   Collection Time: 10/29/22 12:51 PM  Result Value Ref Range   Order Confirmation      ORDER PROCESSED BY BLOOD BANK Performed  at [facility], [address], [place], [place] [address]    Recent Results (from the past 240 hour(s))  Resp panel by RT-PCR (RSV, Flu A&B, Covid) Anterior Nasal Swab     Status: Abnormal   Collection Time: 10/29/22 10:24 AM   Specimen: Anterior Nasal Swab  Result Value Ref Range Status   SARS Coronavirus 2 by RT PCR NEGATIVE NEGATIVE Final    Comment: (NOTE) SARS-CoV-2 target nucleic acids are NOT DETECTED.  The SARS-CoV-2 RNA is generally detectable in upper respiratory specimens during the acute phase of infection. The lowest concentration of SARS-CoV-2 viral copies this assay can detect is 138 copies/mL. A negative result does not preclude SARS-Cov-2 infection and should not be used as the sole basis for treatment or other patient management decisions. A negative result may occur with  improper specimen collection/handling, submission of specimen other than nasopharyngeal swab, presence of viral mutation(s) within the areas targeted by this assay, and inadequate number of viral copies(<138 copies/mL). A negative result must be combined with clinical observations, patient history, and epidemiological information. The  expected result is Negative.  Fact Sheet for Patients:  EntrepreneurPulse.com.au  Fact Sheet for Healthcare Providers:  IncredibleEmployment.be  This test is no t yet approved or cleared by the [place] FDA and  has been authorized for detection and/or diagnosis of SARS-CoV-2 by FDA under an Emergency Use Authorization (EUA). This EUA will remain  in effect (meaning this test can be used) for the duration of the COVID-19 declaration under Section 564(b)(1) of the Act, 21 U.S.C.section 360bbb-3(b)(1), unless the authorization is terminated  or revoked sooner.       Influenza A by PCR POSITIVE (A) NEGATIVE Final   Influenza B by PCR NEGATIVE NEGATIVE Final    Comment: (NOTE) The Xpert Xpress SARS-CoV-2/FLU/RSV plus assay is intended as an aid in the diagnosis of influenza from Nasopharyngeal swab specimens and should not be used as a sole basis for treatment. Nasal washings and aspirates are unacceptable for Xpert Xpress SARS-CoV-2/FLU/RSV testing.  Fact Sheet for Patients: EntrepreneurPulse.com.au  Fact Sheet for Healthcare Providers: IncredibleEmployment.be  This test is not yet approved or cleared by the [place] FDA and has been authorized for detection and/or diagnosis of SARS-CoV-2 by FDA under an Emergency Use Authorization (EUA). This EUA will remain in effect (meaning this test can be used) for the duration of the COVID-19 declaration under Section 564(b)(1) of the Act, 21 U.S.C. section 360bbb-3(b)(1), unless the authorization is terminated or revoked.     Resp Syncytial Virus by PCR NEGATIVE NEGATIVE Final    Comment: (NOTE) Fact Sheet for Patients: EntrepreneurPulse.com.au  Fact Sheet for Healthcare Providers: IncredibleEmployment.be  This test is not yet approved or cleared by the [place] FDA and has been authorized for detection and/or  diagnosis of SARS-CoV-2 by FDA under an Emergency Use Authorization (EUA). This EUA will remain in effect (meaning this test can be used) for the duration of the COVID-19 declaration under Section 564(b)(1) of the Act, 21 U.S.C. section 360bbb-3(b)(1), unless the authorization is terminated or revoked.  Performed at [facility], [address] [place], [place] [address]     Renal Function: Recent Labs    10/29/22 1057  CREATININE 7.08*   Estimated Creatinine Clearance: 12.9 mL/min (A) (by C-G formula based on SCr of 7.08 mg/dL (H)).  Radiologic Imaging: CT HEAD WO CONTRAST (5MM)  Result Date: 10/29/2022 CLINICAL DATA:  Head trauma, minor. Mental status changes. Combative. EXAM: CT HEAD WITHOUT CONTRAST TECHNIQUE:  Contiguous axial images were obtained from the base of the skull through the vertex without intravenous contrast. RADIATION DOSE REDUCTION: This exam was performed according to the departmental dose-optimization program which includes automated exposure control, adjustment of the mA and/or kV according to patient size and/or use of iterative reconstruction technique. COMPARISON:  07/09/2022 FINDINGS: Brain: The study suffers from motion degradation. There is generalized brain atrophy. There chronic small-vessel ischemic changes throughout the cerebral hemispheric white matter. There is an old right frontal cortical and subcortical infarction. No mass, hemorrhage, hydrocephalus or extra-axial collection. Vascular: There is atherosclerotic calcification of the major vessels at the base of the brain. Skull: Negative Sinuses/Orbits: No significant sinus disease. Globe prosthesis on the right. Other: None IMPRESSION: No acute or traumatic finding. Atrophy and chronic small-vessel ischemic changes of the white matter. 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 Electronically Signed   By: Nelson Chimes M.D.   On: 10/29/2022 15:09   CT ABDOMEN PELVIS WO CONTRAST  Result Date:  10/29/2022 CLINICAL DATA:  Altered mental status and sepsis EXAM: CT ABDOMEN AND PELVIS WITHOUT CONTRAST TECHNIQUE: Multidetector CT imaging of the abdomen and pelvis was performed following the standard protocol without IV contrast. RADIATION DOSE REDUCTION: This exam was performed according to the departmental dose-optimization program which includes automated exposure control, adjustment of the mA and/or kV according to patient size and/or use of iterative reconstruction technique. COMPARISON:  None Available. FINDINGS: Lower chest: Technically challenging examination due to patient motion artifact. Bilateral lower lobe ground-glass opacities. No pleural effusion or pneumothorax demonstrated. Partially imaged heart size is normal. Coronary artery calcifications. Hepatobiliary: No focal hepatic lesions. No intra or extrahepatic biliary ductal dilation. Cholecystectomy. Pancreas: No focal lesions or main ductal dilation. Spleen: Surgically absent. Adrenals/Urinary Tract: No adrenal nodules. No suspicious renal masses. Mild right hydronephrosis to the level of a 4 mm stone in the distal ureter (3:78). Bilateral simple-appearing cysts. No focal bladder wall thickening. Stomach/Bowel: Small hiatal hernia. Normal appearance of the stomach. No evidence of bowel wall thickening, distention, or inflammatory changes. The appendix is not discretely seen. Vascular/Lymphatic: Aortic atherosclerosis. No enlarged abdominal or pelvic lymph nodes. Reproductive: Prostate is unremarkable. Other: Peritoneal dialysis catheter present in the lower abdomen. No free air or free fluid. No fluid collections. Musculoskeletal: No acute or abnormal lytic or blastic osseous lesions. 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 IMPRESSION: 1. Challenging examination due to patient motion artifact. 2. Mild right hydronephrosis to the level of a 4 mm stone in the distal ureter. 3. Bilateral lower lobe  ground-glass opacities, likely infectious/inflammatory. 4. Coronary artery calcification. Aortic Atherosclerosis (ICD10-I70.0). Electronically Signed   By: Darrin Nipper M.D.   On: 10/29/2022 14:43   DG Chest Portable 1 View  Result Date: 10/29/2022 CLINICAL DATA:  Fever.  Sepsis. EXAM: PORTABLE CHEST 1 VIEW COMPARISON:  CT 02/15/2021 FINDINGS: Cardiomegaly. Lordotic positioning. Upper lungs are clear. No lower lobe dense consolidation or collapse. Cannot rule out minimal patchy density at the lung bases. No pleural effusion. No bone finding. IMPRESSION: Cardiomegaly. Lordotic positioning. Cannot rule out minimal patchy density at the lung bases. No dense consolidation or collapse. Electronically Signed   By: Nelson Chimes M.D.   On: 10/29/2022 10:55    I independently reviewed the above imaging studies.  Impression/Recommendation Sepsis Right ureterolithiasis ESRD on PD Influenza  Discussed with patient's daughter in person and his significant other via the phone about need for cystoscopy and right ureteral stent placement for obstructing stone.  We discussed that this may not be the sole contributing issue to his current presentation and ailment, but that it was necessary to rule out a GU etiology to his sepsis. Other possible contributions include influenza or exacerbation of existing comorbidities. Patient is not oriented and does not have power of attorney, but daughter and significant other were in agreement with our plan and believe it is in accordance with his wishes.  Otherwise, recommend broad-spectrum antibiotics. Obtain cultures and cater as culture data becomes available. Patient will eventually require definitive stone management as an outpatient after he is medically stabilized. Urology will coordinate.   Ernest Barnett 10/29/2022, 7:58 PM

## 2022-10-29 NOTE — ED Notes (Addendum)
Patient transported to [facility] a this time in NAD, stable at time of transfer. Chand, MD aware of patient needing to be in soft restraints and patient current state and HR.

## 2022-10-29 NOTE — ED Provider Notes (Signed)
From [facility].  This patient was received in transfer from [facility] ED for fever and altered mental status.  The wife reported that he had been in this condition since last night.  Patient was treated for sepsis.  On CT imaging, he has obstructive uropathy.  This was discussed with urology who recommended transfer to [facility].  Patient also tested positive for flu. Physical Exam  BP (!) 134/114 (BP Location: Left Leg)   Pulse (!) 130   Temp 98.6 F (37 C) (Oral)   Resp (!) 23   SpO2 96%   Physical Exam Constitutional:      Appearance: He is ill-appearing. He is not diaphoretic.  HENT:     Head: Normocephalic and atraumatic.     Right Ear: External ear normal.     Left Ear: External ear normal.     Nose: Nose normal.  Cardiovascular:     Rate and Rhythm: Regular rhythm. Tachycardia present.  Pulmonary:     Effort: Tachypnea present.     Breath sounds: Transmitted upper airway sounds present.  Abdominal:     General: There is no distension.  Musculoskeletal:     Cervical back: No rigidity.  Skin:    General: Skin is warm and dry.  Neurological:     Mental Status: He is alert.     GCS: GCS eye subscore is 4. GCS verbal subscore is 1. GCS motor subscore is 6.     Procedures  Procedures  ED Course / MDM   Clinical Course as of 10/29/22 1839  Tue Oct 29, 2022  1307 CT? [CC]    Clinical Course User Index [CC] Tretha Sciara, MD   Medical Decision Making Amount and/or Complexity of Data Reviewed Labs: ordered. Radiology: ordered.  Risk OTC drugs. Prescription drug management. Decision regarding hospitalization.   On assessment, patient seems to be in pain and is moving around in the bed.  He is tachycardic and tachypneic.  He is normotensive at this time.  He is clearly altered and is unable to speak or answer questions.  He is able to follow commands.  Blood is transfusing.  I spoke with urology who states that he will not be able to go to the OR till 10 PM.   Critical care was consulted for admission.  Critical care came and evaluated the patient.  Patient to be admitted to ICU.       Godfrey Pick, MD 10/30/22 [id number]

## 2022-10-29 NOTE — Anesthesia Procedure Notes (Signed)
Procedure Name: Intubation Date/Time: 10/29/2022 10:28 PM  Performed by: Valetta Fuller, CRNAPre-anesthesia Checklist: Patient identified, Emergency Drugs available, Suction available and Patient being monitored Patient Re-evaluated:Patient Re-evaluated prior to induction Oxygen Delivery Method: Circle system utilized Preoxygenation: Pre-oxygenation with 100% oxygen Induction Type: IV induction Ventilation: Two handed mask ventilation required and Oral airway inserted - appropriate to patient size Laryngoscope Size: Glidescope and 4 Grade View: Grade I Tube type: Oral Tube size: 7.5 mm Number of attempts: 2 Airway Equipment and Method: Stylet and Video-laryngoscopy Placement Confirmation: ETT inserted through vocal cords under direct vision, positive ETCO2 and breath sounds checked- equal and bilateral Secured at: 21 cm Tube secured with: Tape Dental Injury: Teeth and Oropharynx as per pre-operative assessment  Comments: DL x 1 with Miller 2. Pharynx with old blood, and swollen. Unable to visualize cords. Mask ventilate with oral airway.   DL x 1 with glidescope, Gr 1 view, easy intubation.

## 2022-10-29 NOTE — H&P (Signed)
NAME:  Ernest Barnett, MRN:  [id number], DOB:  04/01/1956, LOS: 0 ADMISSION DATE:  10/29/2022, CONSULTATION DATE: 10/29/2022 REFERRING MD: Dr. Doren Custard, CHIEF COMPLAINT: Altered mental status  History of Present Illness:  66 year old male with morbid obesity, obesity hypoventilation syndrome, noncompliant with CPAP, end-stage renal disease on peritoneal dialysis, hypertension and hyperlipidemia who presented to [facility] emergency department with altered mental status since yesterday.  Patient is encephalopathic so most of the history is taken from patient's family. Per patient daughter since yesterday he has been sleeping a lot, lethargic and complaining of abdominal pain with fevers.  In the emergency department he spiked fever of 103F, on further workup he was noted to have 4 mm right ureteral stone with hydroureteronephrosis, he was transferred to [facility] emergency department for further evaluation.  Also he was noted to be positive for flu Patient remained tachycardic with persistent altered mental status, PCCM was consulted for help evaluation and medical management  Pertinent  Medical History   Past Medical History:  Diagnosis Date   Chronic kidney disease    COPD (chronic obstructive pulmonary disease) ([place])    CVA (cerebral vascular accident) ([place]) 2013   Cystoid macular edema of left eye 03/07/2021   GERD (gastroesophageal reflux disease)    HOH (hard of hearing)    Hypercholesteremia    Hypertension    Left epiretinal membrane 03/07/2021   Pseudophakia 03/07/2021   Stroke ([place]) 03/24/2012   left sided weakness   Vitamin D deficiency      Significant Hospital Events: Including procedures, antibiotic start and stop dates in addition to other pertinent events     Interim History / Subjective:    Objective   Blood pressure 122/77, pulse (!) 129, temperature 98.6 F (37 C), temperature source Oral, resp. rate (!) 23, SpO2 98 %.       No intake or output data in the 24 hours  ending 10/29/22 1954 There were no vitals filed for this visit.  Examination:   Physical exam: General: Crtitically ill-appearing morbidly obese male, on nasal cannula oxygen HEENT: [place]/AT, eyes anicteric.  Severely dry mucous membranes Neuro: Sleepy, opens eyes with vocal stimuli, following simple commands Chest: Coarse breath sounds bilaterally, no wheezes or rhonchi Heart: Tachycardic, regular rhythm, no murmurs or gallops Abdomen: Soft, nondistended, bowel sounds present Skin: No rash    Resolved Hospital Problem list     Assessment & Plan:  Severe sepsis due to complicated urinary tract infection in the setting of obstructive uropathy Acute septic encephalopathy Patient presented with altered mental status abdominal pain His white count is elevated to 35 K, CT scan confirmed obstructive uropathy He makes little bit of urine but since yesterday he has not made any urine He received 2 L of IV fluid in the emergency department He looks severely dehydrated to me, I will give him 2 L of IV fluid now Urinalysis is ordered, still pending Blood cultures are sent Urology has been consulted, plan for OR tonight for possible ureteral stent Avoid sedation  Acute on chronic hypoxic/hypercapnic respiratory failure Flu pneumonia COPD, not in exacerbation OSA/OHS, noncompliant with CPAP Patient has history of obstructive sleep apnea, he was prescribed CPAP but noncompliant At this time he is on nasal cannula with lethargy he is desatting into high 80s I placed order for CPAP tonight Started on Tamiflu Continue droplet precautions  End-stage renal disease on peritoneal dialysis Patient receives peritoneal dialysis at home 6 days a week except Saturday Will call nephrology in  the morning  Acute on chronic anemia with severe thrombocytopenia likely due to sepsis Multiple myeloma Patient has multiple myeloma, he follows with heme-onc His baseline hemoglobin is around 11 but since  last month it has been trending down, current hemoglobin is 6.0 and platelet counts are also slowly decreasing large 27 He received 1 unit of PRBC No active signs of bleeding Monitor H&H and platelet count and transfuse if less than 20 or any signs of bleeding  Best Practice (right click and "Reselect all SmartList Selections" daily)   Diet/type: NPO w/ oral meds DVT prophylaxis: prophylactic heparin  GI prophylaxis: N/A Lines: N/A Foley:  N/A Code Status:  full code Last date of multidisciplinary goals of care discussion [pending]  Labs   CBC: Recent Labs  Lab 10/29/22 1025  WBC 35.5*  NEUTROABS 0.4*  HGB 6.0*  HCT 18.4*  MCV 110.8*  PLT 27*    Basic Metabolic Panel: Recent Labs  Lab 10/29/22 1057  NA 141  K 3.5  CL 108  CO2 23  GLUCOSE 113*  BUN 44*  CREATININE 7.08*  CALCIUM 8.6*   GFR: Estimated Creatinine Clearance: 12.9 mL/min (A) (by C-G formula based on SCr of 7.08 mg/dL (H)). Recent Labs  Lab 10/29/22 1025 10/29/22 1251  WBC 35.5*  --   LATICACIDVEN 1.4 0.9    Liver Function Tests: Recent Labs  Lab 10/29/22 1057  AST 22  ALT 21  ALKPHOS 28*  BILITOT 0.6  PROT 6.6  ALBUMIN 3.2*   No results for input(s): "LIPASE", "AMYLASE" in the last 168 hours. No results for input(s): "AMMONIA" in the last 168 hours.  ABG    Component Value Date/Time   TCO2 26 01/29/2022 0624     Coagulation Profile: No results for input(s): "INR", "PROTIME" in the last 168 hours.  Cardiac Enzymes: No results for input(s): "CKTOTAL", "CKMB", "CKMBINDEX", "TROPONINI" in the last 168 hours.  HbA1C: No results found for: "HGBA1C"  CBG: No results for input(s): "GLUCAP" in the last 168 hours.  Review of Systems:   Unable to obtain as patient is encephalopathic  Past Medical History:  He,  has a past medical history of Chronic kidney disease, COPD (chronic obstructive pulmonary disease) ([place]), CVA (cerebral vascular accident) ([place]) (2013), Cystoid macular  edema of left eye (03/07/2021), GERD (gastroesophageal reflux disease), HOH (hard of hearing), Hypercholesteremia, Hypertension, Left epiretinal membrane (03/07/2021), Pseudophakia (03/07/2021), Stroke ([place]) (03/24/2012), and Vitamin D deficiency.   Surgical History:   Past Surgical History:  Procedure Laterality Date   A/V FISTULAGRAM Right 01/29/2022   Procedure: A/V Fistulagram;  Surgeon: Serafina Mitchell, MD;  Location: [place] [facility];  Service: Cardiovascular;  Laterality: Right;   AV FISTULA PLACEMENT Right 02/08/2021   Procedure: RIGHT ARM ARTERIOVENOUS (AV) FISTULA CREATION;  Surgeon: [name], MD;  Location: [facility];  Service: Vascular;  Laterality: Right;   BIOPSY  11/12/2018   Procedure: BIOPSY;  Surgeon: Rogene Houston, MD;  Location: [facility];  Service: Endoscopy;;  colon   BIOPSY  05/10/2020   Procedure: BIOPSY;  Surgeon: Rogene Houston, MD;  [facility];  Service: Endoscopy;;  esophagus   CATARACT EXTRACTION     right eye   CATARACT EXTRACTION W/PHACO  10/08/2012   Procedure: CATARACT EXTRACTION PHACO AND INTRAOCULAR LENS PLACEMENT ([place]);  Surgeon: Tonny Branch, MD;  Location: [facility];  Service: Ophthalmology;  Laterality: Left;  CDE:6.64   CHOLECYSTECTOMY     [facility]   COLONOSCOPY N/A 11/12/2018  Procedure: COLONOSCOPY;  Surgeon: Rogene Houston, MD;  [facility];  Service: Endoscopy;  Laterality: N/A;  1030   ELBOW FRACTURE SURGERY     left   ESOPHAGEAL DILATION N/A 11/12/2018   Procedure: ESOPHAGEAL DILATION;  Surgeon: Rogene Houston, MD;  [facility];  Service: Endoscopy;  Laterality: N/A;   ESOPHAGEAL DILATION N/A 05/10/2020   Procedure: ESOPHAGEAL DILATION;  Surgeon: Rogene Houston, MD;  [facility];  Service: Endoscopy;  Laterality: N/A;   ESOPHAGOGASTRODUODENOSCOPY N/A 11/12/2018   Procedure: ESOPHAGOGASTRODUODENOSCOPY (EGD);  Surgeon: Rogene Houston, MD;  [facility];  Service: Endoscopy;  Laterality: N/A;    ESOPHAGOGASTRODUODENOSCOPY N/A 05/10/2020   Procedure: ESOPHAGOGASTRODUODENOSCOPY (EGD);  Surgeon: Rogene Houston, MD;  [facility];  Service: Endoscopy;  Laterality: N/A;  210   HERNIA REPAIR     right inguinal   HYDROCELE EXCISION / REPAIR     IR FLUORO GUIDE CV LINE LEFT  08/17/2021   IR US GUIDE VASC ACCESS LEFT  08/17/2021   LIGATION OF COMPETING BRANCHES OF ARTERIOVENOUS FISTULA Right 09/11/2021   Procedure: LIGATION OF COMPETING BRANCHES OF RIGHT ARM ARTERIOVENOUS FISTULA;  Surgeon: [name], MD;  Location: [facility];  Service: Vascular;  Laterality: Right;   PERIPHERAL VASCULAR BALLOON ANGIOPLASTY Right 01/29/2022   Procedure: PERIPHERAL VASCULAR BALLOON ANGIOPLASTY;  Surgeon: Serafina Mitchell, MD;  Location: [place] [facility];  Service: Cardiovascular;  Laterality: Right;  arm fistula   POLYPECTOMY  11/12/2018   Procedure: POLYPECTOMY;  Surgeon: Rogene Houston, MD;  [facility];  Service: Endoscopy;;  colon    RETINAL DETACHMENT SURGERY Left 2019   SPLENECTOMY, TOTAL     TONSILLECTOMY     VOCAL CORD INJECTION     removal of polyp-2005     Social History:   reports that he has quit smoking. His smoking use included cigarettes. He has a 90.00 pack-year smoking history. He uses smokeless tobacco. He reports that he does not currently use alcohol after a past usage of about 1.0 - 2.0 standard drink of alcohol per week. He reports that he does not use drugs.   Family History:  His family history is not on file.   Allergies No Known Allergies   Home Medications  Prior to Admission medications   Medication Sig Start Date End Date Taking? Authorizing Provider  acetaminophen-codeine (TYLENOL #3) 300-30 MG tablet Take 2 tablets by mouth every 8 (eight) hours as needed. 08/12/22  Yes [provider]  acyclovir (ZOVIRAX) 200 MG capsule Take 200 mg by mouth 2 (two) times daily. 08/27/22  Yes [provider]  albuterol (VENTOLIN HFA) 108 (90  Base) MCG/ACT inhaler Inhale 2 puffs into the lungs every 6 (six) hours as needed for wheezing or shortness of breath. 05/09/22  Yes Lindell Spar, MD  aspirin EC 81 MG tablet Take 1 tablet (81 mg total) by mouth daily. 05/13/20  Yes Rehman, Mechele Dawley, MD  bortezomib IV (VELCADE) 3.5 MG injection Inject 1.5 mg/m2 into the vein once a week. 03/14/21  Yes [provider]  budesonide-formoterol (SYMBICORT) 80-4.5 MCG/ACT inhaler Inhale 2 puffs into the lungs 2 (two) times daily as needed (shortness of breath). 05/09/22  Yes Lindell Spar, MD  cholecalciferol (VITAMIN D3) 25 MCG (1000 UNIT) tablet Take 1,000 Units by mouth daily.   Yes [provider]  cyanocobalamin (VITAMIN B12) 1000 MCG tablet Take 1,000 mcg by mouth daily.  Yes [provider]  erythromycin ophthalmic ointment Place 1 Application into the right eye 3 (three) times daily. 09/03/22  Yes [provider]  gabapentin (NEURONTIN) 300 MG capsule Take 1 capsule by mouth twice daily 02/25/22  Yes Derek Jack, MD  multivitamin (RENA-VIT) TABS tablet Take 1 tablet by mouth daily. 05/13/22  Yes [provider]  pantoprazole (PROTONIX) 40 MG tablet Take 1 tablet (40 mg total) by mouth daily. 05/09/22  Yes Lindell Spar, MD  potassium chloride SA (KLOR-CON M) 20 MEQ tablet Take 1 tablet (20 mEq total) by mouth 2 (two) times daily. 10/22/22  Yes BranchAlphonse Guild, MD  pravastatin (PRAVACHOL) 80 MG tablet Take 1 tablet (80 mg total) by mouth daily. 06/25/21  Yes Noreene Larsson, NP  tamsulosin [facility]) 0.4 MG CAPS capsule Take 1 capsule by mouth once daily 10/17/22  Yes Lindell Spar, MD  tiZANidine (ZANAFLEX) 2 MG tablet TAKE 1 TABLET BY MOUTH EVERY 6 HOURS AS NEEDED FOR MUSCLE SPASM 11/01/21  Yes Noreene Larsson, NP  torsemide (DEMADEX) 100 MG tablet Take 100 mg by mouth 2 (two) times daily. 07/27/21  Yes [provider]  dexamethasone (DECADRON) 4 MG tablet Take 2.5 tablets (10 mg total) by  mouth once a week. Take at home along with Tylenol and Benadryl weekly on the day of daratumumab injection. Patient not taking: Reported on 10/29/2022 10/04/21   Derek Jack, MD  oxyCODONE (OXY IR/ROXICODONE) 5 MG immediate release tablet Take 1 tablet (5 mg total) by mouth 2 (two) times daily as needed for severe pain. Patient not taking: Reported on 10/29/2022 09/11/21   Derek Jack, MD  polyethylene glycol [facility] Floria Raveling) packet Take 17 g by mouth daily. Patient states that he takes as needed. Patient not taking: Reported on 10/29/2022 03/31/18   Rogene Houston, MD  senna (SENOKOT) 8.6 MG TABS tablet Take 2 tablets (17.2 mg total) by mouth daily. Patient taking differently: Take 1 tablet by mouth daily. 08/19/22   Derek Jack, MD  sildenafil (REVATIO) 20 MG tablet Take 1 tablet (20 mg total) by mouth 5 (five) times daily as needed. Patient not taking: Reported on 10/29/2022 10/23/21   Noreene Larsson, NP     Critical care time:     This patient is critically ill with multiple organ system failure which requires frequent high complexity decision making, assessment, support, evaluation, and titration of therapies. This was completed through the application of advanced monitoring technologies and extensive interpretation of multiple databases.  During this encounter critical care time was devoted to patient care services described in this note for 44 minutes.    Jacky Kindle, MD [facility] See Amion for pager If no response to pager, please call [phone] until 7pm After 7pm, Please call E-link [phone]

## 2022-10-29 NOTE — Op Note (Cosign Needed)
PATIENT:  Ernest Barnett  Preoperative diagnosis:  Right ureteral stent Sepsis   Postoperative diagnosis:  Right ureteral stent  Sepsis  Procedure:  Cystoscopy Right ureteral stent placement Right retrograde pyelography with interpretation  Surgeon: Ardis Hughs, M.D.  Assistant: Theodosia Quay, M.D. - Resident Assisting  Anesthesia: General  Complications: None  EBL: Minimal  Specimens: None  Indication: Ernest Barnett is a 66 y.o. male with ESRD on PD, COPD, CVA, GERD, HTN, HLD, stroke, and multiple myeloma who presented from an OSH with sepsis and a right ureteral stone. After reviewing the management options for treatment, they have elected to proceed with the above surgical procedure(s). We have discussed the potential benefits and risks of the procedure, side effects of the proposed treatment, the likelihood of the patient achieving the goals of the procedure, and any potential problems that might occur during the procedure or recuperation. Informed consent has been obtained.  Findings: Trabeculated, normal capacity bladder without lesion or polyps. Fragments of right ureteral stone ejected from right UO with sensorwire placement, urine mildly turbid. Right RPG with moderate hydroureteronephrosis from the level of a filling defect in the distal right ureter. Successful right 6Fr x 26cm JJ stent placement.  Description of procedure:   The patient was taken to the operating room and general anesthesia was induced.  The patient was placed in the dorsal lithotomy position, prepped and draped in the usual sterile fashion, and preoperative antibiotics were administered. A preoperative time-out was performed.   Cystourethroscopy was performed.  The patient's urethra was examined and was normal. The bladder was then systematically examined in its entirety. There was no evidence for any bladder tumors, stones, or other mucosal pathology.    Attention then turned to the  right ureteral orifice and a ureteral catheter was used to intubate the ureteral orifice.  Omnipaque contrast was injected through the ureteral catheter and a retrograde pyelogram which revealed the above findings.  A Sensor guidewire was then advanced up the right ureter into the renal pelvis under fluoroscopic guidance.  The wire was then backloaded through the cystoscope and a ureteral stent was advance over the wire using Seldinger technique.  The stent was positioned appropriately under fluoroscopic and cystoscopic guidance.  The wire was then removed with an adequate stent curl noted in the renal pelvis as well as in the bladder.  The bladder was then left full and a foley catheter was inserted. The patient appeared to tolerate the procedure well and without complications.  The patient was left extubated post-operatively due to wheezing and laryngeal edema noted on intubation with plans to transfer to the ICU for further management.

## 2022-10-29 NOTE — ED Notes (Signed)
Ernest Barnett with [facility] called to setup transport at this time.

## 2022-10-29 NOTE — ED Notes (Signed)
Patient just got back from CT they said they had a real hard time and he has pulled out both of his IV's can only stick left arm because he has a fistula in the right. Blood transfusion wi be started as soon as we get another IV,

## 2022-10-29 NOTE — ED Provider Notes (Signed)
Pt signed out by Dr. Oswald Hillock pending urology consult.  Pt d/w Dr. Louis Meckel (urology).  He recommends transfer to [facility] or [facility] for a stent placement.  As pt is a dialysis pt (albeit peritoneal), I think [facility] will need [facility] so he can see nephrology as well.  As pt is seriously ill, I think this needs to be done sooner rather than later.  Pt will need ED to ED transfer to facilitate transfer quickly.  Pt accepted by Dr. Doren Custard (ED attending).  Blood has been ordered for transfusion which has been started here.  Pt will need admission to the hospital by medicine.  Dr. Louis Meckel (urology) wants to be called when pt arrives.    Pt is stable for transfer.  CRITICAL CARE Performed by: Isla Pence   Total critical care time: 30 minutes  Critical care time was exclusive of separately billable procedures and treating other patients.  Critical care was necessary to treat or prevent imminent or life-threatening deterioration.  Critical care was time spent personally by me on the following activities: development of treatment plan with patient and/or surrogate as well as nursing, discussions with consultants, evaluation of patient's response to treatment, examination of patient, obtaining history from patient or surrogate, ordering and performing treatments and interventions, ordering and review of laboratory studies, ordering and review of radiographic studies, pulse oximetry and re-evaluation of patient's condition.    Isla Pence, MD 10/29/22 1640

## 2022-10-29 NOTE — Progress Notes (Signed)
[facility] Physician-Brief Progress Note Patient Name: Ernest Barnett DOB: October 25, 1956 MRN: [id number]   Date of Service  10/29/2022  HPI/Events of Note  Patient admitted to the ICU with post-op respiratory failure after going to the OR for a right ureteral stent to treat sepsis secondary to right obstructive uropathy.  eICU Interventions  New Patient Evaluation. Foley catheter, soft bilateral wrist restraints, and Propofol ordered.        Kerry Kass Maurion Walkowiak 10/29/2022, 11:50 PM

## 2022-10-29 NOTE — ED Provider Notes (Signed)
[facility] EMERGENCY DEPARTMENT Provider Note   CSN: [id number] Arrival date & time: 10/29/22  0957     History Chief Complaint  Patient presents with   Altered Mental Status   Fever    Came from home by EMS AMS and fever    HPI Ernest Barnett is a 66 y.o. male presenting for chief complaint of altered mental status.  Patient significant other provides most of the history as patient is grossly confused.  She states that he has been had fever cough and congestion over the last 3 days with decreased p.o. intake today.  She called EMS because he stumbled and fell on the way to the bathroom he.  He is unable to provide any further collateral history due to altered mental status.  Patient's significant other states it has been like this since last night.   Patient's recorded medical, surgical, social, medication list and allergies were reviewed in the Snapshot window as part of the initial history.   Review of Systems   Review of Systems  Constitutional:  Positive for fever. Negative for chills and fatigue.  HENT:  Negative for ear pain and sore throat.   Eyes:  Negative for pain and visual disturbance.  Respiratory:  Negative for cough and shortness of breath.   Cardiovascular:  Negative for chest pain and palpitations.  Gastrointestinal:  Negative for abdominal pain and vomiting.  Genitourinary:  Negative for dysuria and hematuria.  Musculoskeletal:  Negative for arthralgias and back pain.  Skin:  Negative for color change and rash.  Neurological:  Negative for seizures and syncope.  All other systems reviewed and are negative.   Physical Exam Updated Vital Signs BP 108/74 (BP Location: Left Leg)   Pulse (!) 132   Temp 98.3 F (36.8 C) (Axillary)   Resp (!) 28   SpO2 93%  Physical Exam Vitals and nursing note reviewed.  Constitutional:      General: He is not in acute distress.    Appearance: He is well-developed.  HENT:     Head: Normocephalic and atraumatic.   Eyes:     Conjunctiva/sclera: Conjunctivae normal.  Cardiovascular:     Rate and Rhythm: Normal rate and regular rhythm.     Heart sounds: No murmur heard. Pulmonary:     Effort: Pulmonary effort is normal. No respiratory distress.     Breath sounds: Normal breath sounds.  Abdominal:     Palpations: Abdomen is soft.     Tenderness: There is no abdominal tenderness.  Musculoskeletal:        General: Deformity (Right eye enucleation.) present. No swelling.     Cervical back: Neck supple.  Skin:    General: Skin is warm and dry.     Capillary Refill: Capillary refill takes less than 2 seconds.  Neurological:     Mental Status: He is alert. He is disoriented.  Psychiatric:        Mood and Affect: Mood normal.      ED Course/ Medical Decision Making/ A&P Clinical Course as of 10/29/22 1538  Tue Oct 29, 2022  1307 CT? [CC]    Clinical Course User Index [CC] Tretha Sciara, MD    Procedures Procedures   Medications Ordered in ED Medications  iohexol (OMNIPAQUE) 300 MG/ML solution 100 mL (has no administration in time range)  0.9 %  sodium chloride infusion (Manually program via Guardrails IV Fluids) (has no administration in time range)  acetaminophen (TYLENOL) 120 MG suppository (  Not Given  10/29/22 1223)  haloperidol lactate (HALDOL) injection 5 mg (has no administration in time range)  fentaNYL (SUBLIMAZE) injection 25 mcg (has no administration in time range)  lactated ringers bolus 1,000 mL (0 mLs Intravenous Stopped 10/29/22 1100)  vancomycin (VANCOCIN) IVPB 1000 mg/200 mL premix (0 mg Intravenous Stopped 10/29/22 1159)  ceFEPIme (MAXIPIME) 2 g in sodium chloride 0.9 % 100 mL IVPB (0 g Intravenous Stopped 10/29/22 1158)  lactated ringers bolus 1,000 mL (0 mLs Intravenous Stopped 10/29/22 1317)  acetaminophen (TYLENOL) suppository 650 mg (650 mg Rectal Given 10/29/22 1213)  haloperidol lactate (HALDOL) injection 2 mg (2 mg Intravenous Given 10/29/22 1213)   LORazepam (ATIVAN) injection 2 mg (2 mg Intravenous Given 10/29/22 1326)    Medical Decision Making:    Ernest Barnett is a 66 y.o. male who presented to the ED today with chief complaint of altered mental status detailed above.     Handoff received from EMS.  Patient's presentation is complicated by their history of multiple comorbid medical conditions including multiple myeloma.  Patient placed on continuous vitals and telemetry monitoring while in ED which was reviewed periodically.   Complete initial physical exam performed, notably the patient  was tachycardic, hypertensive, febrile to 103 triggering Sepsis.   Notably he is diffusely altered withGCS approximately 12.   Reviewed and confirmed nursing documentation for past medical history, family history, social history.    Initial Assessment:   With the patient's presentation of AMS, most likely diagnosis is sepsis of uncertain etiology.  Most likely source would be urinary versus pulmonary.  Viral etiology is also considered Given his prodrome in 3 to 5 days. Metabolic disruption also possible given his history of peritoneal dialysis.  These are considered less likely due to history of present illness and physical exam findings.   This is most consistent with an acute life/limb threatening illness complicated by underlying chronic conditions.  Initial Plan:  CT abdomen pelvis to evaluate for structural intra-abdominal etiology of patient's hearing AMS CT head to evaluate for structural intracranial etiology Screening labs including CBC and Metabolic panel to evaluate for infectious or metabolic etiology of disease.  Urinalysis with reflex culture ordered to evaluate for UTI or relevant urologic/nephrologic pathology.  CXR to evaluate for structural/infectious intrathoracic pathology.  EKG to evaluate for cardiac pathology. Objective evaluation as below reviewed with plan for close reassessment  Initial Study Results:    Laboratory  Multiple concerning abnormalities.  Patient with critical anemia of hemoglobin of 6.  History of similar per his oncologist Dr. Rema Fendt.  Patient consented for transfusion emergently  EKG EKG was reviewed independently. Rate, rhythm, axis, intervals all examined and without medically relevant abnormality. ST segments without concerns for elevations.    Radiology  All images reviewed independently. Agree with radiology report at this time.   CT HEAD WO CONTRAST (5MM)  Result Date: 10/29/2022 CLINICAL DATA:  Head trauma, minor. Mental status changes. Combative. EXAM: CT HEAD WITHOUT CONTRAST TECHNIQUE: Contiguous axial images were obtained from the base of the skull through the vertex without intravenous contrast. RADIATION DOSE REDUCTION: This exam was performed according to the departmental dose-optimization program which includes automated exposure control, adjustment of the mA and/or kV according to patient size and/or use of iterative reconstruction technique. COMPARISON:  07/09/2022 FINDINGS: Brain: The study suffers from motion degradation. There is generalized brain atrophy. There chronic small-vessel ischemic changes throughout the cerebral hemispheric white matter. There is an old right frontal cortical and subcortical infarction. No mass, hemorrhage, hydrocephalus or  extra-axial collection. Vascular: There is atherosclerotic calcification of the major vessels at the base of the brain. Skull: Negative Sinuses/Orbits: No significant sinus disease. Globe prosthesis on the right. Other: None IMPRESSION: No acute or traumatic finding. Atrophy and chronic small-vessel ischemic changes of the white matter. 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 Electronically Signed   By: Nelson Chimes M.D.   On: 10/29/2022 15:09   CT ABDOMEN PELVIS WO CONTRAST  Result Date: 10/29/2022 CLINICAL DATA:  Altered mental status and sepsis EXAM: CT ABDOMEN AND PELVIS WITHOUT CONTRAST  TECHNIQUE: Multidetector CT imaging of the abdomen and pelvis was performed following the standard protocol without IV contrast. RADIATION DOSE REDUCTION: This exam was performed according to the departmental dose-optimization program which includes automated exposure control, adjustment of the mA and/or kV according to patient size and/or use of iterative reconstruction technique. COMPARISON:  None Available. FINDINGS: Lower chest: Technically challenging examination due to patient motion artifact. Bilateral lower lobe ground-glass opacities. No pleural effusion or pneumothorax demonstrated. Partially imaged heart size is normal. Coronary artery calcifications. Hepatobiliary: No focal hepatic lesions. No intra or extrahepatic biliary ductal dilation. Cholecystectomy. Pancreas: No focal lesions or main ductal dilation. Spleen: Surgically absent. Adrenals/Urinary Tract: No adrenal nodules. No suspicious renal masses. Mild right hydronephrosis to the level of a 4 mm stone in the distal ureter (3:78). Bilateral simple-appearing cysts. No focal bladder wall thickening. Stomach/Bowel: Small hiatal hernia. Normal appearance of the stomach. No evidence of bowel wall thickening, distention, or inflammatory changes. The appendix is not discretely seen. Vascular/Lymphatic: Aortic atherosclerosis. No enlarged abdominal or pelvic lymph nodes. Reproductive: Prostate is unremarkable. Other: Peritoneal dialysis catheter present in the lower abdomen. No free air or free fluid. No fluid collections. Musculoskeletal: No acute or abnormal lytic or blastic osseous lesions. 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 IMPRESSION: 1. Challenging examination due to patient motion artifact. 2. Mild right hydronephrosis to the level of a 4 mm stone in the distal ureter. 3. Bilateral lower lobe ground-glass opacities, likely infectious/inflammatory. 4. Coronary artery calcification. Aortic Atherosclerosis  (ICD10-I70.0). Electronically Signed   By: Darrin Nipper M.D.   On: 10/29/2022 14:43   DG Chest Portable 1 View  Result Date: 10/29/2022 CLINICAL DATA:  Fever.  Sepsis. EXAM: PORTABLE CHEST 1 VIEW COMPARISON:  CT 02/15/2021 FINDINGS: Cardiomegaly. Lordotic positioning. Upper lungs are clear. No lower lobe dense consolidation or collapse. Cannot rule out minimal patchy density at the lung bases. No pleural effusion. No bone finding. IMPRESSION: Cardiomegaly. Lordotic positioning. Cannot rule out minimal patchy density at the lung bases. No dense consolidation or collapse. Electronically Signed   By: Nelson Chimes M.D.   On: 10/29/2022 10:55     Consults:  Case discussed with patient's oncologist Dr. Raliegh Ip.  He did not feel that this represents AML conversion.  He feels that patient's presentation is consistent with sepsis.  Consultation ordered with urology.  Final Assessment and Plan:   ***   ***  Clinical Impression: No diagnosis found.   Data Unavailable   Final Clinical Impression(s) / ED Diagnoses Final diagnoses:  None    Rx / DC Orders ED Discharge Orders     None

## 2022-10-29 NOTE — Consult Note (Signed)
Spoke with the emergency room physician at [facility] regarding this patient.  He has lots of medical issues and it is hard to know if the stone is the proximate cause of his sepsis and altered mental status.  However, I recommended that he be transferred to [facility] so that we could discuss stent placement with him and his family as an option to treat the potential of a uroseptic type picture.  Our plan is to transfer the patient ER to ER, and then have the staff at the [facility] ER consulted [place] for further evaluation.  The patient will be admitted to the internal medicine service.

## 2022-10-29 NOTE — ED Notes (Signed)
Report given to Jolyn Nap

## 2022-10-29 NOTE — Transfer of Care (Signed)
Immediate Anesthesia Transfer of Care Note  Patient: Ernest Barnett  Procedure(s) Performed: CYSTOSCOPY , URETEROSCOPY AND STENT PLACEMENT RIGHT (Right)  Patient Location: NICU  Anesthesia Type:General  Level of Consciousness: Patient remains intubated per anesthesia plan  Airway & Oxygen Therapy: Patient placed on Ventilator (see vital sign flow sheet for setting)  Post-op Assessment: Report given to RN and Post -op Vital signs reviewed and stable  Post vital signs: Reviewed and stable  Last Vitals:  Vitals Value Taken Time  BP 128/83 10/29/22 2330  Temp    Pulse 129 10/29/22 2332  Resp 16 10/29/22 2332  SpO2 92 % 10/29/22 2332  Vitals shown include unvalidated device data.  Last Pain:  Vitals:   10/29/22 1714  TempSrc: Oral  PainSc:       Patients Stated Pain Goal: 0 (09/81/19 1478)  Complications: No notable events documented.

## 2022-10-30 ENCOUNTER — Inpatient Hospital Stay: Payer: Medicare HMO

## 2022-10-30 ENCOUNTER — Encounter (HOSPITAL_COMMUNITY): Payer: Self-pay | Admitting: Urology

## 2022-10-30 ENCOUNTER — Inpatient Hospital Stay (HOSPITAL_COMMUNITY): Payer: Medicare HMO

## 2022-10-30 DIAGNOSIS — N138 Other obstructive and reflux uropathy: Secondary | ICD-10-CM

## 2022-10-30 DIAGNOSIS — N200 Calculus of kidney: Secondary | ICD-10-CM | POA: Diagnosis not present

## 2022-10-30 LAB — POCT I-STAT 7, (LYTES, BLD GAS, ICA,H+H)
Acid-base deficit: 4 mmol/L — ABNORMAL HIGH (ref 0.0–2.0)
Bicarbonate: 21.7 mmol/L (ref 20.0–28.0)
Calcium, Ion: 1.16 mmol/L (ref 1.15–1.40)
HCT: 18 % — ABNORMAL LOW (ref 39.0–52.0)
Hemoglobin: 6.1 g/dL — CL (ref 13.0–17.0)
O2 Saturation: 100 %
Patient temperature: 98.1
Potassium: 3.9 mmol/L (ref 3.5–5.1)
Sodium: 142 mmol/L (ref 135–145)
TCO2: 23 mmol/L (ref 22–32)
pCO2 arterial: 40.9 mmHg (ref 32–48)
pH, Arterial: 7.331 — ABNORMAL LOW (ref 7.35–7.45)
pO2, Arterial: 191 mmHg — ABNORMAL HIGH (ref 83–108)

## 2022-10-30 LAB — DIC (DISSEMINATED INTRAVASCULAR COAGULATION)PANEL
D-Dimer, Quant: 3.53 ug/mL-FEU — ABNORMAL HIGH (ref 0.00–0.50)
Fibrinogen: 651 mg/dL — ABNORMAL HIGH (ref 210–475)
INR: 1.4 — ABNORMAL HIGH (ref 0.8–1.2)
Platelets: 49 10*3/uL — ABNORMAL LOW (ref 150–400)
Prothrombin Time: 16.6 seconds — ABNORMAL HIGH (ref 11.4–15.2)
Smear Review: NONE SEEN
aPTT: 42 seconds — ABNORMAL HIGH (ref 24–36)

## 2022-10-30 LAB — BPAM RBC
Blood Product Expiration Date: 202401172359
ISSUE DATE / TIME: 202312261610
Unit Type and Rh: 6200

## 2022-10-30 LAB — CBC
HCT: 18.3 % — ABNORMAL LOW (ref 39.0–52.0)
Hemoglobin: 6.1 g/dL — CL (ref 13.0–17.0)
MCH: 35.7 pg — ABNORMAL HIGH (ref 26.0–34.0)
MCHC: 33.3 g/dL (ref 30.0–36.0)
MCV: 107 fL — ABNORMAL HIGH (ref 80.0–100.0)
Platelets: 20 10*3/uL — CL (ref 150–400)
RBC: 1.71 MIL/uL — ABNORMAL LOW (ref 4.22–5.81)
RDW: 26.4 % — ABNORMAL HIGH (ref 11.5–15.5)
WBC: 20.9 10*3/uL — ABNORMAL HIGH (ref 4.0–10.5)
nRBC: 1.3 % — ABNORMAL HIGH (ref 0.0–0.2)

## 2022-10-30 LAB — BASIC METABOLIC PANEL
Anion gap: 12 (ref 5–15)
BUN: 42 mg/dL — ABNORMAL HIGH (ref 8–23)
CO2: 21 mmol/L — ABNORMAL LOW (ref 22–32)
Calcium: 8.1 mg/dL — ABNORMAL LOW (ref 8.9–10.3)
Chloride: 107 mmol/L (ref 98–111)
Creatinine, Ser: 7.02 mg/dL — ABNORMAL HIGH (ref 0.61–1.24)
GFR, Estimated: 8 mL/min — ABNORMAL LOW (ref >60)
Glucose, Bld: 102 mg/dL — ABNORMAL HIGH (ref 70–99)
Potassium: 3.6 mmol/L (ref 3.5–5.1)
Sodium: 140 mmol/L (ref 135–145)

## 2022-10-30 LAB — BODY FLUID CELL COUNT WITH DIFFERENTIAL
Eos, Fluid: 0 %
Lymphs, Fluid: 66 %
Monocyte-Macrophage-Serous Fluid: 8 % — ABNORMAL LOW (ref 50–90)
Neutrophil Count, Fluid: 26 % — ABNORMAL HIGH (ref 0–25)
Total Nucleated Cell Count, Fluid: 12 cu mm (ref 0–1000)

## 2022-10-30 LAB — TYPE AND SCREEN
ABO/RH(D): A POS
Antibody Screen: NEGATIVE
Unit division: 0

## 2022-10-30 LAB — IRON AND TIBC
Iron: 91 ug/dL (ref 45–182)
Saturation Ratios: 38 % (ref 17.9–39.5)
TIBC: 241 ug/dL — ABNORMAL LOW (ref 250–450)
UIBC: 150 ug/dL

## 2022-10-30 LAB — PHOSPHORUS: Phosphorus: 5.2 mg/dL — ABNORMAL HIGH (ref 2.5–4.6)

## 2022-10-30 LAB — FERRITIN: Ferritin: 1883 ng/mL — ABNORMAL HIGH (ref 24–336)

## 2022-10-30 LAB — MRSA NEXT GEN BY PCR, NASAL: MRSA by PCR Next Gen: NOT DETECTED

## 2022-10-30 LAB — TRIGLYCERIDES: Triglycerides: 252 mg/dL — ABNORMAL HIGH (ref <150)

## 2022-10-30 LAB — PREPARE RBC (CROSSMATCH)

## 2022-10-30 LAB — MAGNESIUM: Magnesium: 1.6 mg/dL — ABNORMAL LOW (ref 1.7–2.4)

## 2022-10-30 MED ORDER — GENTAMICIN SULFATE 0.1 % EX CREA
1.0000 | TOPICAL_CREAM | Freq: Every day | CUTANEOUS | Status: DC
  Administered 2022-10-30 – 2022-11-07 (×11): 1 via TOPICAL
  Filled 2022-10-30: qty 15

## 2022-10-30 MED ORDER — ORAL CARE MOUTH RINSE
15.0000 mL | OROMUCOSAL | Status: DC
  Administered 2022-10-30 (×9): 15 mL via OROMUCOSAL

## 2022-10-30 MED ORDER — GENTAMICIN SULFATE 0.1 % EX CREA: 1.0000 | TOPICAL_CREAM | Freq: Every day | CUTANEOUS | Status: DC

## 2022-10-30 MED ORDER — ORAL CARE MOUTH RINSE
15.0000 mL | OROMUCOSAL | Status: DC
  Administered 2022-10-31 – 2022-11-02 (×10): 15 mL via OROMUCOSAL

## 2022-10-30 MED ORDER — SODIUM CHLORIDE 0.9% IV SOLUTION: Freq: Once | INTRAVENOUS | Status: DC

## 2022-10-30 MED ORDER — PIPERACILLIN-TAZOBACTAM IN DEX 2-0.25 GM/50ML IV SOLN
2.2500 g | Freq: Three times a day (TID) | INTRAVENOUS | Status: AC
  Administered 2022-10-30 – 2022-11-03 (×14): 2.25 g via INTRAVENOUS
  Filled 2022-10-30 (×15): qty 50

## 2022-10-30 MED ORDER — MAGNESIUM SULFATE IN D5W 1-5 GM/100ML-% IV SOLN
1.0000 g | Freq: Once | INTRAVENOUS | Status: AC
  Administered 2022-10-30: 1 g via INTRAVENOUS
  Filled 2022-10-30: qty 100

## 2022-10-30 MED ORDER — DELFLEX-LC/1.5% DEXTROSE 344 MOSM/L IP SOLN: INTRAPERITONEAL | Status: DC

## 2022-10-30 MED ORDER — FUROSEMIDE 10 MG/ML IJ SOLN
120.0000 mg | Freq: Once | INTRAVENOUS | Status: AC
  Administered 2022-10-30: 120 mg via INTRAVENOUS
  Filled 2022-10-30: qty 10

## 2022-10-30 MED ORDER — OSELTAMIVIR PHOSPHATE 6 MG/ML PO SUSR
30.0000 mg | Freq: Every day | ORAL | Status: DC
  Administered 2022-10-31: 30 mg
  Filled 2022-10-30 (×2): qty 12.5

## 2022-10-30 MED ORDER — ORAL CARE MOUTH RINSE: 15.0000 mL | OROMUCOSAL | Status: DC | PRN

## 2022-10-30 MED ORDER — SODIUM CHLORIDE 0.9 % IV SOLN: 250.0000 mL | INTRAVENOUS | Status: DC

## 2022-10-30 MED ORDER — DELFLEX-LC/1.5% DEXTROSE 344 MOSM/L IP SOLN: INTRAPERITONEAL | Status: AC

## 2022-10-30 MED ORDER — HALOPERIDOL LACTATE 5 MG/ML IJ SOLN
5.0000 mg | Freq: Once | INTRAMUSCULAR | Status: AC
  Administered 2022-10-30: 5 mg via INTRAVENOUS
  Filled 2022-10-30: qty 1

## 2022-10-30 MED ORDER — PROPOFOL 1000 MG/100ML IV EMUL: 5.0000 ug/kg/min | INTRAVENOUS | Status: DC

## 2022-10-30 MED ORDER — CHLORHEXIDINE GLUCONATE CLOTH 2 % EX PADS
6.0000 | MEDICATED_PAD | Freq: Every day | CUTANEOUS | Status: DC
  Administered 2022-10-31 – 2022-11-07 (×7): 6 via TOPICAL

## 2022-10-30 MED ORDER — ACETAMINOPHEN 650 MG RE SUPP
650.0000 mg | RECTAL | Status: DC | PRN
  Administered 2022-10-30: 650 mg via RECTAL
  Filled 2022-10-30 (×2): qty 1

## 2022-10-30 MED ORDER — DEXMEDETOMIDINE HCL IN NACL 400 MCG/100ML IV SOLN
0.0000 ug/kg/h | INTRAVENOUS | Status: DC
  Administered 2022-10-30: 1.2 ug/kg/h via INTRAVENOUS
  Administered 2022-10-30: 0.5 ug/kg/h via INTRAVENOUS
  Administered 2022-10-30 – 2022-10-31 (×2): 1.2 ug/kg/h via INTRAVENOUS
  Administered 2022-10-31: 0.6 ug/kg/h via INTRAVENOUS
  Administered 2022-10-31: 1.2 ug/kg/h via INTRAVENOUS
  Administered 2022-11-02: 0.8 ug/kg/h via INTRAVENOUS
  Administered 2022-11-02 (×2): 0.6 ug/kg/h via INTRAVENOUS
  Administered 2022-11-03: 0.4 ug/kg/h via INTRAVENOUS
  Administered 2022-11-03: 0.5 ug/kg/h via INTRAVENOUS
  Administered 2022-11-03: 0.4 ug/kg/h via INTRAVENOUS
  Filled 2022-10-30: qty 200
  Filled 2022-10-30 (×10): qty 100

## 2022-10-30 MED ORDER — NOREPINEPHRINE 4 MG/250ML-% IV SOLN
2.0000 ug/min | INTRAVENOUS | Status: DC
  Administered 2022-10-30: 2 ug/min via INTRAVENOUS
  Administered 2022-10-31: 3.013 ug/min via INTRAVENOUS
  Administered 2022-10-31: 2 ug/min via INTRAVENOUS
  Filled 2022-10-30 (×3): qty 250

## 2022-10-30 NOTE — Consult Note (Signed)
[name]  INPATIENT CONSULTATION  Reason for Consultation: abd pain in PD patient  Requesting Provider: Dr. Pennie Banter  HPI: Ernest Barnett is an 66 y.o. male with HTN, COPD, h/o CVA, GERD, ESRD on PD ([facility]), OHS, HL, IgG K[facility] MM currently admitted with sepsis and nephrology is consulted for evaluation and management of his ESRD and r/o peritonitis.  Presented to [facility] ED yesterday with several day history of abd pain + fever to 103.  Found to have flu.  CT abd showing 92m R ureteral stone with hydro [facility].  He transferred to the ICU intubated overnight and was extubated this morning.  Combative and confused, requiring bipap for hypoxia.  Pt unable to provide and history and no family present.  Labs with WBC 35k yest down to 21k today;  Hb 7.8 > 6.1.  Plt  27 > 20.  Per I/Os +1.5L but see orders for at least 4L LR yesterday.   On low dose NE.   PMH: Past Medical History:  Diagnosis Date   Chronic kidney disease    COPD (chronic obstructive pulmonary disease) (HCC)    [place]   Cystoid macular edema of left eye 03/07/2021   GERD (gastroesophageal reflux disease)    HOH (hard of hearing)    Hypercholesteremia    Hypertension    Left epiretinal membrane 03/07/2021   Pseudophakia 03/07/2021   Stroke ([place] 03/24/2012   left sided weakness   Vitamin D deficiency    PSH: Past Surgical History:  Procedure Laterality Date   A/V FISTULAGRAM Right 01/29/2022   Procedure: A/V Fistulagram;  Surgeon: BSerafina Mitchell MD;  [facility];  Service: Cardiovascular;  Laterality: Right;   AV FISTULA PLACEMENT Right 02/08/2021   Procedure: RIGHT ARM ARTERIOVENOUS (AV) FISTULA CREATION;  Surgeon: ERosetta Posner MD;  Location: [facility] ORS;  Service: Vascular;  Laterality: Right;   BIOPSY  11/12/2018   Procedure: BIOPSY;  Surgeon: RRogene Houston MD;  Location: [facility] ENDO SUITE;   Service: Endoscopy;;  colon   BIOPSY  05/10/2020   Procedure: BIOPSY;  Surgeon: RRogene Houston MD;  Location: [facility] ENDO SUITE;  Service: Endoscopy;;  esophagus   CATARACT EXTRACTION     right eye   CATARACT EXTRACTION W/PHACO  10/08/2012   Procedure: CATARACT EXTRACTION PHACO AND INTRAOCULAR LENS PLACEMENT ([place];  Surgeon: KTonny Branch MD;  Location: AP ORS;  Service: Ophthalmology;  Laterality: Left;  CDE:6.64   [place]ST[place]Y     [place]  COLONOSCOPY N/A 11/12/2018   Procedure: COLONOSCOPY;  Surgeon: RRogene Houston MD;  Location: [facility] ENDO SUITE;  Service: Endoscopy;  Laterality: N/A;  [place] URETEROSCOPY AND STENT PLACEMENT Right 10/29/2022   Procedure: CYSTOSCOPY , URETEROSCOPY AND STENT PLACEMENT RIGHT;  Surgeon: HArdis Hughs MD;  [place]: [place]  Service: Urology;  Laterality: Right;   ELBOW FRACTURE SURGERY     left   ESOPHAGEAL DILATION N/A 11/12/2018   Procedure: ESOPHAGEAL DILATION;  Surgeon: RRogene Houston MD;  Location: [facility] ENDO SUITE;  Service: Endoscopy;  Laterality: N/A;   ESOPHAGEAL DILATION N/A 05/10/2020   Procedure: ESOPHAGEAL DILATION;  Surgeon: RRogene Houston MD;  Location: [facility] ENDO SUITE;  Service: Endoscopy;  Laterality: N/A;   ESOPHAGOGASTRODUODENOSCOPY N/A 11/12/2018   Procedure: ESOPHAGOGASTRODUODENOSCOPY (EGD);  Surgeon: RRogene Houston MD;  Location: [facility] ENDO SUITE;  Service:  Endoscopy;  Laterality: N/A;   ESOPHAGOGASTRODUODENOSCOPY N/A 05/10/2020   Procedure: ESOPHAGOGASTRODUODENOSCOPY (EGD);  Surgeon: Rogene Houston, MD;  Location: [facility] ENDO SUITE;  Service: Endoscopy;  Laterality: N/A;  210   HERNIA REPAIR     right inguinal   HYDROCELE EXCISION / REPAIR     IR FLUORO GUIDE CV LINE LEFT  08/17/2021   IR US GUIDE VASC ACCESS LEFT  08/17/2021   LIGATION OF COMPETING BRANCHES OF ARTERIOVENOUS FISTULA Right 09/11/2021   Procedure: LIGATION OF COMPETING BRANCHES OF RIGHT ARM ARTERIOVENOUS FISTULA;  Surgeon: [name], MD;   Location: [facility] ORS;  Service: Vascular;  Laterality: Right;   PERIPHERAL VASCULAR BALLOON ANGIOPLASTY Right 01/29/2022   Procedure: PERIPHERAL VASCULAR BALLOON ANGIOPLASTY;  Surgeon: Serafina Mitchell, MD;  [place] [facility];  Service: Cardiovascular;  Laterality: Right;  arm fistula   POLYPECTOMY  11/12/2018   Procedure: POLYPECTOMY;  Surgeon: Rogene Houston, MD;  Location: [facility] ENDO SUITE;  Service: Endoscopy;;  colon    RETINAL DETACHMENT SURGERY Left 2019   SPLENECTOMY, TOTAL     TONSILLECTOMY     VOCAL CORD INJECTION     removal of polyp-2005    Past Medical History:  Diagnosis Date   Chronic kidney disease    COPD (chronic obstructive pulmonary disease) ([place])    CVA (cerebral vascular accident) ([place] 2013   Cystoid macular edema of left eye 03/07/2021   GERD (gastroesophageal reflux disease)    HOH (hard of hearing)    Hypercholesteremia    Hypertension    Left epiretinal membrane 03/07/2021   Pseudophakia 03/07/2021   Stroke ([place]) 03/24/2012   left sided weakness   Vitamin D deficiency     Medications:  I have reviewed the patient's current medications.  Medications Prior to Admission  Medication Sig Dispense Refill   acetaminophen-codeine (TYLENOL #3) 300-30 MG tablet Take 2 tablets by mouth every 8 (eight) hours as needed.     acyclovir (ZOVIRAX) 200 MG capsule Take 200 mg by mouth 2 (two) times daily.     albuterol (VENTOLIN HFA) 108 (90 Base) MCG/ACT inhaler Inhale 2 puffs into the lungs every 6 (six) hours as needed for wheezing or shortness of breath. 8 g 5   aspirin EC 81 MG tablet Take 1 tablet (81 mg total) by mouth daily. 30 tablet 11   bortezomib IV (VELCADE) 3.5 MG injection Inject 1.5 mg/m2 into the vein once a week.     budesonide-formoterol (SYMBICORT) 80-4.5 MCG/ACT inhaler Inhale 2 puffs into the lungs 2 (two) times daily as needed (shortness of breath). 10.2 each 5   cholecalciferol (VITAMIN D3) 25 MCG (1000 UNIT) tablet Take 1,000 Units by mouth  daily.     cyanocobalamin (VITAMIN B12) 1000 MCG tablet Take 1,000 mcg by mouth daily.     erythromycin ophthalmic ointment Place 1 Application into the right eye 3 (three) times daily.     gabapentin (NEURONTIN) 300 MG capsule Take 1 capsule by mouth twice daily 60 capsule 6   multivitamin (RENA-VIT) TABS tablet Take 1 tablet by mouth daily.     pantoprazole (PROTONIX) 40 MG tablet Take 1 tablet (40 mg total) by mouth daily. 90 tablet 3   potassium chloride SA (KLOR-CON M) 20 MEQ tablet Take 1 tablet (20 mEq total) by mouth 2 (two) times daily.     pravastatin (PRAVACHOL) 80 MG tablet Take 1 tablet (80 mg total) by mouth daily. 90 tablet 3   tamsulosin (FLOMAX) 0.4 MG CAPS  capsule Take 1 capsule by mouth once daily 30 capsule 0   tiZANidine (ZANAFLEX) 2 MG tablet TAKE 1 TABLET BY MOUTH EVERY 6 HOURS AS NEEDED FOR MUSCLE SPASM 30 tablet 0   torsemide (DEMADEX) 100 MG tablet Take 100 mg by mouth 2 (two) times daily.     dexamethasone (DECADRON) 4 MG tablet Take 2.5 tablets (10 mg total) by mouth once a week. Take at home along with Tylenol and Benadryl weekly on the day of daratumumab injection. (Patient not taking: Reported on 10/29/2022) 30 tablet 2   oxyCODONE (OXY IR/ROXICODONE) 5 MG immediate release tablet Take 1 tablet (5 mg total) by mouth 2 (two) times daily as needed for severe pain. (Patient not taking: Reported on 10/29/2022) 30 tablet 0   polyethylene glycol (MIRALAX / GLYCOLAX) packet Take 17 g by mouth daily. Patient states that he takes as needed. (Patient not taking: Reported on 10/29/2022) 30 each 5   senna (SENOKOT) 8.6 MG TABS tablet Take 2 tablets (17.2 mg total) by mouth daily. (Patient taking differently: Take 1 tablet by mouth daily.) 120 tablet 2   sildenafil (REVATIO) 20 MG tablet Take 1 tablet (20 mg total) by mouth 5 (five) times daily as needed. (Patient not taking: Reported on 10/29/2022) 30 tablet 2    ALLERGIES:  No Known Allergies  FAM HX: History reviewed. No  pertinent family history.  Social History:   reports that he has quit smoking. His smoking use included cigarettes. He has a 90.00 pack-year smoking history. He uses smokeless tobacco. He reports that he does not currently use alcohol after a past usage of about 1.0 - 2.0 standard drink of alcohol per week. He reports that he does not use drugs.  ROS: unable to obtain given AMS  Blood pressure (!) 117/99, pulse (!) 138, temperature 98.3 F (36.8 C), temperature source Axillary, resp. rate (!) 30, weight 116.9 kg, SpO2 (!) 86 %. PHYSICAL EXAM: Gen: obese man who is agitated and restless  Eyes: anicteric ENT: on bipap Neck: thick CV: tachycardic Abd: obese, soft, nontender with deep palpation, PD catheter with abd binder intact, clean Lungs: clear, no rales or wheezes GU: foley draining amber blood tinged urine Extr: 1+ diffuse edema Neuro: not following commands or conversating, appears fairly nonfocal  Skin: no obvious wounds or rashes   Results for orders placed or performed during the hospital encounter of 10/29/22 (from the past 48 hour(s))  Resp panel by RT-PCR (RSV, Flu A&B, Covid) Anterior Nasal Swab     Status: Abnormal   Collection Time: 10/29/22 10:24 AM   Specimen: Anterior Nasal Swab  Result Value Ref Range   SARS Coronavirus 2 by RT PCR NEGATIVE NEGATIVE    Comment: (NOTE) SARS-CoV-2 target nucleic acids are NOT DETECTED.  The SARS-CoV-2 RNA is generally detectable in upper respiratory specimens during the acute phase of infection. The lowest concentration of SARS-CoV-2 viral copies this assay can detect is 138 copies/mL. A negative result does not preclude SARS-Cov-2 infection and should not be used as the sole basis for treatment or other patient management decisions. A negative result may occur with  improper specimen collection/handling, submission of specimen other than nasopharyngeal swab, presence of viral mutation(s) within the areas targeted by this assay,  and inadequate number of viral copies(<138 copies/mL). A negative result must be combined with clinical observations, patient history, and epidemiological information. The expected result is Negative.  Fact Sheet for Patients:  EntrepreneurPulse.com.au  Fact Sheet for Healthcare Providers:  IncredibleEmployment.be  This test is no t yet approved or cleared by the [place] and  has been authorized for detection and/or diagnosis of SARS-CoV-2 by FDA under an Emergency Use Authorization (EUA). This EUA will remain  in effect (meaning this test can be used) for the duration of the COVID-19 declaration under Section 564(b)(1) of the Act, 21 U.S.C.section 360bbb-3(b)(1), unless the authorization is terminated  or revoked sooner.       Influenza A by PCR POSITIVE (A) NEGATIVE   Influenza B by PCR NEGATIVE NEGATIVE    Comment: (NOTE) The Xpert Xpress SARS-CoV-2/FLU/RSV plus assay is intended as an aid in the diagnosis of influenza from Nasopharyngeal swab specimens and should not be used as a sole basis for treatment. Nasal washings and aspirates are unacceptable for Xpert Xpress SARS-CoV-2/FLU/RSV testing.  Fact Sheet for Patients: EntrepreneurPulse.com.au  Fact Sheet for Healthcare Providers: IncredibleEmployment.be  This test is not yet approved or cleared by the [place] FDA and has been authorized for detection and/or diagnosis of SARS-CoV-2 by FDA under an Emergency Use Authorization (EUA). This EUA will remain in effect (meaning this test can be used) for the duration of the COVID-19 declaration under Section 564(b)(1) of the Act, 21 U.S.C. section 360bbb-3(b)(1), unless the authorization is terminated or revoked.     Resp Syncytial Virus by PCR NEGATIVE NEGATIVE    Comment: (NOTE) Fact Sheet for Patients: EntrepreneurPulse.com.au  Fact Sheet for Healthcare  Providers: IncredibleEmployment.be  This test is not yet approved or cleared by the [place] FDA and has been authorized for detection and/or diagnosis of SARS-CoV-2 by FDA under an Emergency Use Authorization (EUA). This EUA will remain in effect (meaning this test can be used) for the duration of the COVID-19 declaration under Section 564(b)(1) of the Act, 21 U.S.C. section 360bbb-3(b)(1), unless the authorization is terminated or revoked.  Performed at [facility], [address], [place], [place] [address]   CBC with Differential     Status: Abnormal   Collection Time: 10/29/22 10:25 AM  Result Value Ref Range   WBC 35.5 (H) 4.0 - 10.5 K/uL   RBC 1.66 (L) 4.22 - 5.81 MIL/uL   Hemoglobin 6.0 (LL) 13.0 - 17.0 g/dL    Comment: This critical result has verified and been called to TALBOT T. by Sharlot Gowda on 12 26 2023 at 1139, and has been read back.    HCT 18.4 (L) 39.0 - 52.0 %   MCV 110.8 (H) 80.0 - 100.0 fL   MCH 36.1 (H) 26.0 - 34.0 pg   MCHC 32.6 30.0 - 36.0 g/dL   RDW 25.5 (H) 11.5 - 15.5 %   Platelets 27 (LL) 150 - 400 K/uL    Comment: Immature Platelet Fraction may be clinically indicated, consider ordering this additional test [id number] THIS CRITICAL RESULT HAS VERIFIED AND BEEN CALLED TO TALBOT T. BY SUZANNE THOMPSON ON 12 26 2023 AT 1139, AND HAS BEEN READ BACK.     nRBC 0.9 (H) 0.0 - 0.2 %   Neutrophils Relative % 1 %   Neutro Abs 0.4 (LL) 1.7 - 7.7 K/uL    Comment: This critical result has verified and been called to TALBOT T. by Sharlot Gowda on 12 26 2023 at 1157, and has been read back.    Lymphocytes Relative 87 %   Lymphs Abs 30.9 (H) 0.7 - 4.0 K/uL   Monocytes Relative 5 %   Monocytes Absolute 1.8 (H) 0.1 - 1.0 K/uL   Eosinophils Relative 0 %  Eosinophils Absolute 0.0 0.0 - 0.5 K/uL   Basophils Relative 0 %   Basophils Absolute 0.0 0.0 - 0.1 K/uL   WBC Morphology      MARKED LEFT SHIFT (>5% METAS,MYELOS AND PROS, OCC  BLAST NOTED)    Comment: SMUDGE CELLS   RBC Morphology FEW NRBCS    Smear Review PLATELET COUNT CONFIRMED BY SMEAR    Blasts 7 %   Abs Immature Granulocytes 0.00 0.00 - 0.07 K/uL   Reactive, Benign Lymphocytes PRESENT    Smudge Cells PRESENT    Polychromasia PRESENT     Comment: Performed at [facility], [address], [place], [place] [address]  Blood culture (routine x 2)     Status: None (Preliminary result)   Collection Time: 10/29/22 10:25 AM   Specimen: BLOOD  Result Value Ref Range   Specimen Description BLOOD LEFT ARM    Special Requests      BOTTLES DRAWN AEROBIC AND ANAEROBIC Blood Culture results may not be optimal due to an inadequate volume of blood received in culture bottles   Culture      NO GROWTH < 24 HOURS Performed at [facility], [address] [place], [place] [address]    Report Status PENDING   Lactic acid, plasma     Status: None   Collection Time: 10/29/22 10:25 AM  Result Value Ref Range   Lactic Acid, Venous 1.4 0.5 - 1.9 mmol/L    Comment: Performed at [facility], [address], [place], [place] [address]  Comprehensive metabolic panel     Status: Abnormal   Collection Time: 10/29/22 10:57 AM  Result Value Ref Range   Sodium 141 135 - 145 mmol/L   Potassium 3.5 3.5 - 5.1 mmol/L   Chloride 108 98 - 111 mmol/L   CO2 23 22 - 32 mmol/L   Glucose, Bld 113 (H) 70 - 99 mg/dL    Comment: Glucose reference range applies only to samples taken after fasting for at least 8 hours.   BUN 44 (H) 8 - 23 mg/dL   Creatinine, Ser 7.08 (H) 0.61 - 1.24 mg/dL   Calcium 8.6 (L) 8.9 - 10.3 mg/dL   Total Protein 6.6 6.5 - 8.1 g/dL   Albumin 3.2 (L) 3.5 - 5.0 g/dL   AST 22 15 - 41 U/L   ALT 21 0 - 44 U/L   Alkaline Phosphatase 28 (L) 38 - 126 U/L   Total Bilirubin 0.6 0.3 - 1.2 mg/dL   GFR, Estimated 8 (L) >60 mL/min    Comment: (NOTE) Calculated using the CKD-EPI Creatinine Equation (2021)    Anion gap 10 5 - 15    Comment: Performed at [facility],  [address], [place], [place] [address]  Blood culture (routine x 2)     Status: None (Preliminary result)   Collection Time: 10/29/22 10:58 AM   Specimen: BLOOD  Result Value Ref Range   Specimen Description BLOOD RIGHT HAND    Special Requests      BOTTLES DRAWN AEROBIC AND ANAEROBIC Blood Culture results may not be optimal due to an excessive volume of blood received in culture bottles   Culture      NO GROWTH < 24 HOURS Performed at [facility], [address], [place], [place] [address]    Report Status PENDING   Lactic acid, plasma     Status: None   Collection Time: 10/29/22 12:51 PM  Result Value Ref Range   Lactic Acid, Venous 0.9 0.5 -  1.9 mmol/L    Comment: Performed at [facility], [address], [place], [place] [address]  Type and screen [facility]     Status: None   Collection Time: 10/29/22 12:51 PM  Result Value Ref Range   ABO/RH(D) A POS    Antibody Screen NEG    Sample Expiration 11/01/2022,2359    Unit Number [id number]    Blood Component Type RED CELLS,LR    Unit division 00    Status of Unit ISSUED,FINAL    Transfusion Status OK TO TRANSFUSE    Crossmatch Result      Compatible Performed at [facility], [address], [place], [place] [address]   Prepare RBC (crossmatch)     Status: None   Collection Time: 10/29/22 12:51 PM  Result Value Ref Range   Order Confirmation      ORDER PROCESSED BY BLOOD BANK Performed at [facility], [address] [place], [place] [address]   Protime-INR     Status: Abnormal   Collection Time: 10/29/22  9:20 PM  Result Value Ref Range   Prothrombin Time 16.4 (H) 11.4 - 15.2 seconds   INR 1.3 (H) 0.8 - 1.2    Comment: (NOTE) INR goal varies based on device and disease states. Performed at [facility], [place] [address] [place], [place] [address]   Lactate dehydrogenase     Status: Abnormal   Collection Time: 10/29/22  9:20 PM  Result Value Ref Range   LDH 586 (H) 98 - 192 U/L    Comment: Performed  at [place] [address] [place], [place] [address]  Reticulocytes     Status: Abnormal   Collection Time: 10/29/22  9:20 PM  Result Value Ref Range   Retic Ct Pct 1.6 0.4 - 3.1 %   RBC. 1.95 (L) 4.22 - 5.81 MIL/uL   Retic Count, Absolute 30.2 19.0 - 186.0 K/uL   Immature Retic Fract 2.6 2.3 - 15.9 %    Comment: Performed at [place] [address] [place], [place] [address]  Urinalysis, Routine w reflex microscopic Urine, Clean Catch     Status: Abnormal   Collection Time: 10/29/22  9:28 PM  Result Value Ref Range   Color, Urine YELLOW YELLOW   APPearance CLEAR CLEAR   Specific Gravity, Urine 1.013 1.005 - 1.030   pH 7.0 5.0 - 8.0   Glucose, UA 50 (A) NEGATIVE mg/dL   Hgb urine dipstick SMALL (A) NEGATIVE   Bilirubin Urine NEGATIVE NEGATIVE   Ketones, ur NEGATIVE NEGATIVE mg/dL   Protein, ur 100 (A) NEGATIVE mg/dL   Nitrite NEGATIVE NEGATIVE   Leukocytes,Ua NEGATIVE NEGATIVE   RBC / HPF 0-5 0 - 5 RBC/hpf   WBC, UA 0-5 0 - 5 WBC/hpf   Bacteria, UA NONE SEEN NONE SEEN   Squamous Epithelial / LPF 0-5 0 - 5 /HPF    Comment: Performed at [facility], [place] [address], [place], [place] [address]  POCT I-Stat EG7     Status: Abnormal   Collection Time: 10/29/22 10:39 PM  Result Value Ref Range   pH, Ven 7.253 7.25 - 7.43   pCO2, Ven 52.5 44 - 60 mmHg   pO2, Ven 49 (H) 32 - 45 mmHg   Bicarbonate 23.2 20.0 - 28.0 mmol/L   TCO2 25 22 - 32 mmol/L   O2 Saturation 78 %   Acid-base deficit 4.0 (H) 0.0 - 2.0 mmol/L   Sodium 141  135 - 145 mmol/L   Potassium 4.3 3.5 - 5.1 mmol/L   Calcium, Ion 1.20 1.15 - 1.40 mmol/L   HCT 23.0 (L) 39.0 - 52.0 %   Hemoglobin 7.8 (L) 13.0 - 17.0 g/dL   Sample type VENOUS   MRSA Next Gen by PCR, Nasal     Status: None   Collection Time: 10/30/22 12:00 AM   Specimen: Nasal Mucosa; Nasal Swab  Result Value Ref Range   MRSA by PCR Next Gen NOT DETECTED NOT DETECTED    Comment: (NOTE) The GeneXpert MRSA Assay (FDA approved  for NASAL specimens only), is one component of a comprehensive MRSA colonization surveillance program. It is not intended to diagnose MRSA infection nor to guide or monitor treatment for MRSA infections. Test performance is not FDA approved in patients less than 90 years old. Performed at [facility], [place] [address], [place], [place] [address]   I-STAT 7, (LYTES, BLD GAS, ICA, H+H)     Status: Abnormal   Collection Time: 10/30/22  3:52 AM  Result Value Ref Range   pH, Arterial 7.331 (L) 7.35 - 7.45   pCO2 arterial 40.9 32 - 48 mmHg   pO2, Arterial 191 (H) 83 - 108 mmHg   Bicarbonate 21.7 20.0 - 28.0 mmol/L   TCO2 23 22 - 32 mmol/L   O2 Saturation 100 %   Acid-base deficit 4.0 (H) 0.0 - 2.0 mmol/L   Sodium 142 135 - 145 mmol/L   Potassium 3.9 3.5 - 5.1 mmol/L   Calcium, Ion 1.16 1.15 - 1.40 mmol/L   HCT 18.0 (L) 39.0 - 52.0 %   Hemoglobin 6.1 (LL) 13.0 - 17.0 g/dL   Patient temperature 98.1 F    Collection site RADIAL, ALLEN'S TEST ACCEPTABLE    Drawn by RT    Sample type ARTERIAL    Comment NOTIFIED PHYSICIAN   CBC     Status: Abnormal   Collection Time: 10/30/22  5:00 AM  Result Value Ref Range   WBC 20.9 (H) 4.0 - 10.5 K/uL   RBC 1.71 (L) 4.22 - 5.81 MIL/uL   Hemoglobin 6.1 (LL) 13.0 - 17.0 g/dL    Comment: REPEATED TO VERIFY THIS CRITICAL RESULT HAS VERIFIED AND BEEN CALLED TO B ALI RN BY SHAMEKA WALKER ON 12 27 2023 AT 0658, AND HAS BEEN READ BACK.     HCT 18.3 (L) 39.0 - 52.0 %   MCV 107.0 (H) 80.0 - 100.0 fL   MCH 35.7 (H) 26.0 - 34.0 pg   MCHC 33.3 30.0 - 36.0 g/dL   RDW 26.4 (H) 11.5 - 15.5 %   Platelets 20 (LL) 150 - 400 K/uL    Comment: Immature Platelet Fraction may be clinically indicated, consider ordering this additional test [id number] REPEATED TO VERIFY THIS CRITICAL RESULT HAS VERIFIED AND BEEN CALLED TO B ALI RN BY SHAMEKA WALKER ON 12 27 2023 AT 0658, AND HAS BEEN READ BACK.     nRBC 1.3 (H) 0.0 - 0.2 %    Comment: Performed at [facility], [place] [address] [place], [place] [address]  Basic metabolic panel     Status: Abnormal   Collection Time: 10/30/22  5:00 AM  Result Value Ref Range   Sodium 140 135 - 145 mmol/L   Potassium 3.6 3.5 - 5.1 mmol/L   Chloride 107 98 - 111 mmol/L   CO2 21 (L) 22 - 32 mmol/L   Glucose, Bld 102 (H) 70 - 99 mg/dL  Comment: Glucose reference range applies only to samples taken after fasting for at least 8 hours.   BUN 42 (H) 8 - 23 mg/dL   Creatinine, Ser 7.02 (H) 0.61 - 1.24 mg/dL   Calcium 8.1 (L) 8.9 - 10.3 mg/dL   GFR, Estimated 8 (L) >60 mL/min    Comment: (NOTE) Calculated using the CKD-EPI Creatinine Equation (2021)    Anion gap 12 5 - 15    Comment: Performed at [place] [address] [place], [place] [address]  Magnesium     Status: Abnormal   Collection Time: 10/30/22  5:00 AM  Result Value Ref Range   Magnesium 1.6 (L) 1.7 - 2.4 mg/dL    Comment: Performed at [place] [address], [place], [place] [address]  Phosphorus     Status: Abnormal   Collection Time: 10/30/22  5:00 AM  Result Value Ref Range   Phosphorus 5.2 (H) 2.5 - 4.6 mg/dL    Comment: Performed at [place] [address] [place], [place] [address]  Triglycerides     Status: Abnormal   Collection Time: 10/30/22  5:00 AM  Result Value Ref Range   Triglycerides 252 (H) <150 mg/dL    Comment: Performed at [place] [address], [place], [place] [address]    DG C-Arm 1-60 Min  Result Date: 10/30/2022 CLINICAL DATA:  Stent placement EXAM: DG C-ARM 1-60 MIN FLUOROSCOPY: Fluoroscopy Time:  20.6 Radiation Exposure Index (if provided by the fluoroscopic device): 12.2 mGy Number of Acquired Spot Images: 2 FINDINGS: Multiple intraoperative fluoroscopic spot images are provided without a radiologist present. IMPRESSION: Intraoperative fluoroscopic images during stent placement. Electronically Signed   By: Placido Sou M.D.   On: 10/30/2022 00:57   CT HEAD WO  CONTRAST (5MM)  Result Date: 10/29/2022 CLINICAL DATA:  Head trauma, minor. Mental status changes. Combative. EXAM: CT HEAD WITHOUT CONTRAST TECHNIQUE: Contiguous axial images were obtained from the base of the skull through the vertex without intravenous contrast. RADIATION DOSE REDUCTION: This exam was performed according to the departmental dose-optimization program which includes automated exposure control, adjustment of the mA and/or kV according to patient size and/or use of iterative reconstruction technique. COMPARISON:  07/09/2022 FINDINGS: Brain: The study suffers from motion degradation. There is generalized brain atrophy. There chronic small-vessel ischemic changes throughout the cerebral hemispheric white matter. There is an old right frontal cortical and subcortical infarction. No mass, hemorrhage, hydrocephalus or extra-axial collection. Vascular: There is atherosclerotic calcification of the major vessels at the base of the brain. Skull: Negative Sinuses/Orbits: No significant sinus disease. Globe prosthesis on the right. Other: None IMPRESSION: No acute or traumatic finding. Atrophy and chronic small-vessel ischemic changes of the white matter. 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 Electronically Signed   By: Nelson Chimes M.D.   On: 10/29/2022 15:09   CT ABDOMEN PELVIS WO CONTRAST  Result Date: 10/29/2022 CLINICAL DATA:  Altered mental status and sepsis EXAM: CT ABDOMEN AND PELVIS WITHOUT CONTRAST TECHNIQUE: Multidetector CT imaging of the abdomen and pelvis was performed following the standard protocol without IV contrast. RADIATION DOSE REDUCTION: This exam was performed according to the departmental dose-optimization program which includes automated exposure control, adjustment of the mA and/or kV according to patient size and/or use of iterative reconstruction technique. COMPARISON:  None Available. FINDINGS: Lower chest: Technically challenging examination due to  patient motion artifact. Bilateral lower lobe ground-glass opacities. No pleural effusion or pneumothorax demonstrated. Partially  imaged heart size is normal. Coronary artery calcifications. Hepatobiliary: No focal hepatic lesions. No intra or extrahepatic biliary ductal dilation. Cholecystectomy. Pancreas: No focal lesions or main ductal dilation. Spleen: Surgically absent. Adrenals/Urinary Tract: No adrenal nodules. No suspicious renal masses. Mild right hydronephrosis to the level of a 4 mm stone in the distal ureter (3:78). Bilateral simple-appearing cysts. No focal bladder wall thickening. Stomach/Bowel: Small hiatal hernia. Normal appearance of the stomach. No evidence of bowel wall thickening, distention, or inflammatory changes. The appendix is not discretely seen. Vascular/Lymphatic: Aortic atherosclerosis. No enlarged abdominal or pelvic lymph nodes. Reproductive: Prostate is unremarkable. Other: Peritoneal dialysis catheter present in the lower abdomen. No free air or free fluid. No fluid collections. Musculoskeletal: No acute or abnormal lytic or blastic osseous lesions. 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 IMPRESSION: 1. Challenging examination due to patient motion artifact. 2. Mild right hydronephrosis to the level of a 4 mm stone in the distal ureter. 3. Bilateral lower lobe ground-glass opacities, likely infectious/inflammatory. 4. Coronary artery calcification. Aortic Atherosclerosis (ICD10-I70.0). Electronically Signed   By: Darrin Nipper M.D.   On: 10/29/2022 14:43   DG Chest Portable 1 View  Result Date: 10/29/2022 CLINICAL DATA:  Fever.  Sepsis. EXAM: PORTABLE CHEST 1 VIEW COMPARISON:  CT 02/15/2021 FINDINGS: Cardiomegaly. Lordotic positioning. Upper lungs are clear. No lower lobe dense consolidation or collapse. Cannot rule out minimal patchy density at the lung bases. No pleural effusion. No bone finding. IMPRESSION: Cardiomegaly. Lordotic  positioning. Cannot rule out minimal patchy density at the lung bases. No dense consolidation or collapse. Electronically Signed   By: Nelson Chimes M.D.   On: 10/29/2022 10:55    Assessment/Plan **Septic shock:  febrile, marked leukocytosis.  +influenza. S/p ureteral stent for hydro/36m stone.  UA didn't look like UTI.  Collect peritoneal fluid for cell count and culture r/o peritonitis (d/w dialysis RN) - exam not typical for peritonitis.  CXR being repeated. On broad spectrum abx per primary.   **ESRD on PD:  will plan for PD tonight if pt safe to do so.  Labs look acceptable at this time.  Will try to temporize volume with high dose loop diuretic.   **AHRF:  just extubated and worsening hypoxia.  CXR pending.  Giving lasix now.  On tamiflu.   **Anemia:  Hb in 6s, transfusion per primary.  Will obtain outpt ESA report.  Check iron.  Heme notes from 11/28 show eval - started B12 injections.    **IgG Kappa MM: f/b Dr. KDelton Coombes current tx Daratumumab SQ + Bortezomib + Dexamethasone (DaraVd) q21d / Daratumumab SQ q28d.    **AMS: per primary; BUN <<100 doubt uremic  Will follow - call with concerns.   LJustin Mend12/27/2023, 10:24 AM

## 2022-10-30 NOTE — Progress Notes (Addendum)
CCM ground team notified verbally while on the floor of IV teams inability to obtain USGIV access and in need of a CVC. Ground team to assess the patient.

## 2022-10-30 NOTE — Progress Notes (Signed)
Pt taken off bipap and placed on 10L HFNC at this time. Pt with slight increased WOB, RT will closely monitor for need to go back on bipap or possible re-intubation

## 2022-10-30 NOTE — Progress Notes (Signed)
Consult for USGPIV for vasopressor. No veins found adequate for this use. Primary RN notified.

## 2022-10-30 NOTE — Progress Notes (Signed)
eLink Physician-Brief Progress Note Patient Name: FAMOUS EISENHARDT DOB: 1956/10/19 MRN: [id number]   Date of Service  10/30/2022  HPI/Events of Note  Patient's blood pressure is soft on Propofol gtt.  eICU Interventions  Peripheral Norepinephrine gtt ordered for BP support.        Kerry Kass Simranjit Thayer 10/30/2022, 1:51 AM

## 2022-10-30 NOTE — Progress Notes (Signed)
P[t extubated at 105 able to speak follows commands very combative cursing says he want to go home.abdominal breathing. He is trying to refuse O2 and states he wants to die. CCM in to see pt and aware

## 2022-10-30 NOTE — Progress Notes (Signed)
CCM MD notified of critical hgb and plt count. At [facility]

## 2022-10-30 NOTE — Procedures (Signed)
PD Note  This writer instilled 1000 ml into the patient's PD catheter to dwell per order to obtain a culture and cell count. Patient tolerated the treatment well.  The day time set up was left clamped and in place to allow for obtaining the lab.  Patient was agitated when his BiPAP had to be adjusted.

## 2022-10-30 NOTE — Progress Notes (Addendum)
Pt placed on NIV PC on vent due to increased WOB, Low SPO2, snoring respirations, and severe agitation post extubation. CCM NP at bedside

## 2022-10-30 NOTE — Progress Notes (Signed)
eLink Physician-Brief Progress Note Patient Name: Ernest Barnett DOB: October 16, 1956 MRN: [id number]   Date of Service  10/30/2022  HPI/Events of Note  Patient needs restraints renewed for safety.  eICU Interventions  Restraints order renewed.        Kerry Kass Lidie Glade 10/30/2022, 7:30 PM

## 2022-10-30 NOTE — Progress Notes (Signed)
PCCM Interval Progress Note  Off BiPAP roughly 1 hour ago and transitioned to [facility]. Has had some agitation since, pulling at lines etc with intermittent confusion.  Discussed Precedex earlier while on BiPAP but he had seemed to settle out.  Will try now and assess his response. If no improvement or develops increased WOB again or worsening mentation etc then likely will need re-intubation.    Montey Hora, [place] [facility] For pager details, please see AMION or use Epic chat  After 1900, please call [facility] for cross coverage needs 10/30/2022, 4:03 PM

## 2022-10-30 NOTE — Progress Notes (Incomplete)
Date and time results received: 10/30/22 0657   Test: HgB 6.1  Critical Value:   Name of Provider Notified: ***  Orders Received? Or Actions Taken?: {ED Critical Value actions [phone]

## 2022-10-30 NOTE — Progress Notes (Signed)
RT and RN tried to place patient on BiPAP for drop in O2 sats.  Patient kept turning head and would not allow mask to be placed on.  [place] turned from 12L up to 15L.  Patient VSS.  RT will continue to monitor.

## 2022-10-30 NOTE — Anesthesia Postprocedure Evaluation (Signed)
Anesthesia Post Note  Patient: Ernest Barnett  Procedure(s) Performed: CYSTOSCOPY , URETEROSCOPY AND STENT PLACEMENT RIGHT (Right)     Patient location during evaluation: ICU Anesthesia Type: General Level of consciousness: patient remains intubated per anesthesia plan Pain management: pain level controlled Respiratory status: patient remains intubated per anesthesia plan Cardiovascular status: stable Postop Assessment: no apparent nausea or vomiting Anesthetic complications: no   No notable events documented.  Last Vitals:  Vitals:   10/30/22 0010 10/30/22 0037  BP: (!) 84/58 (!) 104/38  Pulse: (!) 124 (!) 121  Resp: (!) 21 (!) 21  Temp:    SpO2: 96% 97%    Last Pain:  Vitals:   10/29/22 1714  TempSrc: Oral  PainSc:                  Rilea Arutyunyan

## 2022-10-30 NOTE — Progress Notes (Signed)
The documentation in the flow sheet for 10/30/22 was information that is not applicable to this patient.  Please disregard any documentation for that time related to hemodialysis

## 2022-10-30 NOTE — Procedures (Signed)
Extubation Procedure Note  Patient Details:   Name: Ernest Barnett DOB: [birth year] MRN: [id number]   Airway Documentation:    Vent end date: 10/30/22 Vent end time: 1005   Evaluation  O2 sats: transiently fell during during procedure and currently acceptable Complications: No apparent complications Patient did tolerate procedure well. Bilateral Breath Sounds: Rhonchi, Diminished   Yes  Pt extubated to 4L [name], Increased to 12L HFNC to maintain spo2 >88%. Positive cuff leak noted prior to extubation. Pt would not let RN or RT suction his mouth post extubation. Pt very agitated and uncooperative  Jesse Sans 10/30/2022, 10:19 AM

## 2022-10-30 NOTE — Procedures (Signed)
Central Venous Catheter Insertion Procedure Note  KIEL COCKERELL  [id number]  February 20, 1956  Date:10/30/22  Time:3:36 AM   Provider Performing:Mylia Pondexter [name] Ayesha Rumpf   Procedure: Insertion of Non-tunneled Central Venous Catheter(36556) with US guidance ([id number])   Indication(s) Medication administration and Difficult access  Consent Unable to obtain consent due to emergent nature of procedure.  Anesthesia Topical only with 1% lidocaine   Timeout Verified patient identification, verified procedure, site/side was marked, verified correct patient position, special equipment/implants available, medications/allergies/relevant history reviewed, required imaging and test results available.  Sterile Technique Maximal sterile technique including full sterile barrier drape, hand hygiene, sterile gown, sterile gloves, mask, hair covering, sterile ultrasound probe cover (if used).  Procedure Description Area of catheter insertion was cleaned with chlorhexidine and draped in sterile fashion.  With real-time ultrasound guidance a central venous catheter was placed into the right femoral vein. Nonpulsatile blood flow and easy flushing noted in all ports.  The catheter was sutured in place and sterile dressing applied.  Complications/Tolerance None; patient tolerated the procedure well. Chest X-ray is ordered to verify placement for internal jugular or subclavian cannulation.   Chest x-ray is not ordered for femoral cannulation.  EBL Minimal  Specimen(s) None  Lestine Mount, [place] [facility] 10/30/22 3:37 AM  Please see [email or web address] for pager details.  From 7A-7P if no response, please call [phone] After hours, please call ELink [phone]

## 2022-10-30 NOTE — Progress Notes (Signed)
NAME:  Ernest Barnett, MRN:  [id number], DOB:  1956/02/28, LOS: 1 ADMISSION DATE:  10/29/2022, CONSULTATION DATE: 10/29/2022 REFERRING MD: Dr. Doren Custard, CHIEF COMPLAINT: Altered mental status  History of Present Illness:  66 year old male with morbid obesity, obesity hypoventilation syndrome, noncompliant with CPAP, end-stage renal disease on peritoneal dialysis, hypertension and hyperlipidemia who presented to [facility] emergency department with altered mental status since yesterday.  Patient is encephalopathic so most of the history is taken from patient's family. Per patient daughter since yesterday he has been sleeping a lot, lethargic and complaining of abdominal pain with fevers.  In the emergency department he spiked fever of 103F, on further workup he was noted to have 4 mm right ureteral stone with hydroureteronephrosis, he was transferred to [facility] emergency department for further evaluation.  Also he was noted to be positive for flu Patient remained tachycardic with persistent altered mental status, PCCM was consulted for help evaluation and medical management  Pertinent  Medical History   Past Medical History:  Diagnosis Date   Chronic kidney disease    COPD (chronic obstructive pulmonary disease) ([place])    CVA (cerebral vascular accident) ([place]) 2013   Cystoid macular edema of left eye 03/07/2021   GERD (gastroesophageal reflux disease)    HOH (hard of hearing)    Hypercholesteremia    Hypertension    Left epiretinal membrane 03/07/2021   Pseudophakia 03/07/2021   Stroke ([place]) 03/24/2012   left sided weakness   Vitamin D deficiency      Significant Hospital Events: Including procedures, antibiotic start and stop dates in addition to other pertinent events   12/26 admitted, to OR with urology for cystoscopy and right ureteral stent  Interim History / Subjective:  Weaning on PSV 5/5. Great vent mechanics despite 50 Propofol, just turned off for WUA and probable  extubation.  Objective   Blood pressure (!) 106/53, pulse (!) 103, temperature 98.3 F (36.8 C), temperature source Axillary, resp. rate (!) 21, weight 116.9 kg, SpO2 97 %.    Vent Mode: PRVC FiO2 (%):  [40 %-60 %] 40 % Set Rate:  [16 bmp] 16 bmp Vt Set:  [520 mL-550 mL] 520 mL PEEP:  [5 cmH20] 5 cmH20 Plateau Pressure:  [10 cmH20] 10 cmH20   Intake/Output Summary (Last 24 hours) at 10/30/2022 0913 Last data filed at 10/30/2022 0800 Gross per 24 hour  Intake 1537.03 ml  Output 450 ml  Net 1087.03 ml   Filed Weights   10/30/22 0130  Weight: 116.9 kg    Examination: General: Adult male, resting in bed, in NAD. Neuro: Sedated, not responsive yet but was on 50 propofol which has now been turned off for WUA and probable extubation. HEENT: [place]/AT. Sclerae anicteric. ETT in place. Cardiovascular: RRR, no M/R/G.  Lungs: Respirations even and unlabored.  CTA bilaterally, No W/R/R. Abdomen: Obese, PD catheter in place and appears clean. BS x 4, soft, NT/ND.  Musculoskeletal: No gross deformities, no edema.  Skin: Intact, warm, no rashes.   Assessment & Plan:   Severe sepsis due to complicated urinary tract infection in the setting of obstructive uropathy - s/p cystoscopy and right ureteral stent placement overnight 12/26. UA suprisingly clean, culture pending Acute septic encephalopathy - Continue CTX. - Follow cultures. - Continue low dose Levophed for goal MAP > 65, suspect exacerbated by Propofol which is now off; therefore, suspect Levo will come off shortly.  Acute on chronic hypoxic/hypercapnic respiratory failure - s/p intubation for OR. Flu A pneumonia. COPD,  not in exacerbation. OSA/OHS, noncompliant with CPAP. - Propofol turned off for WUA . - Continue PSV and once more awake likely proceed with extubation. - Nocturnal CPAP ordered. - Tamiflu. - Droplet precautions.  End-stage renal disease on home peritoneal dialysis 6 days/week. - Nephrology notified of  admission, appreciate the assistance.  Hypomagnesemia. - Replete.  Acute on chronic anemia with severe thrombocytopenia - worsened since admit and has some blood tinged urine in foley. Hx Multiple myeloma. - Transfuse 1u PRBC and 1u Platelets. - F/u CBC. - F/u with heme/onc as outpatient.  Best Practice (right click and "Reselect all SmartList Selections" daily)   Diet/type: NPO w/ oral meds DVT prophylaxis: prophylactic heparin  GI prophylaxis: N/A Lines: N/A Foley:  N/A Code Status:  full code Last date of multidisciplinary goals of care discussion [pending]  Critical care time: 30 min.    Montey Hora, [place] [facility] For pager details, please see AMION or use Epic chat  After 1900, please call [facility] for cross coverage needs 10/30/2022, 9:51 AM

## 2022-10-30 NOTE — Procedures (Signed)
PD Note: Patient sample was obtained and the catheter drained a total of 600 ml of the 1000 ml instilled.  The patient was attempting to remove any line or tube he could get hold of.  Disconnected as soon as it became apparent that we wound not be able to protect the tube any further.  New cap applied.  Patient had been increasingly agitated and the patient's nurse stated he was pulling at the dialysis tubing. He was doing this when this Probation officer entered the room.  Lab specimen being taken to lab for analysis.

## 2022-10-31 ENCOUNTER — Inpatient Hospital Stay (HOSPITAL_COMMUNITY): Payer: Medicare HMO

## 2022-10-31 ENCOUNTER — Inpatient Hospital Stay: Payer: Medicare HMO

## 2022-10-31 DIAGNOSIS — R4182 Altered mental status, unspecified: Secondary | ICD-10-CM

## 2022-10-31 DIAGNOSIS — D61818 Other pancytopenia: Secondary | ICD-10-CM | POA: Diagnosis not present

## 2022-10-31 DIAGNOSIS — N200 Calculus of kidney: Secondary | ICD-10-CM | POA: Diagnosis not present

## 2022-10-31 DIAGNOSIS — N138 Other obstructive and reflux uropathy: Secondary | ICD-10-CM | POA: Diagnosis not present

## 2022-10-31 LAB — CBC
HCT: 21 % — ABNORMAL LOW (ref 39.0–52.0)
Hemoglobin: 7 g/dL — ABNORMAL LOW (ref 13.0–17.0)
MCH: 34.7 pg — ABNORMAL HIGH (ref 26.0–34.0)
MCHC: 33.3 g/dL (ref 30.0–36.0)
MCV: 104 fL — ABNORMAL HIGH (ref 80.0–100.0)
Platelets: 39 10*3/uL — ABNORMAL LOW (ref 150–400)
RBC: 2.02 MIL/uL — ABNORMAL LOW (ref 4.22–5.81)
RDW: 26.2 % — ABNORMAL HIGH (ref 11.5–15.5)
WBC: 13.3 10*3/uL — ABNORMAL HIGH (ref 4.0–10.5)
nRBC: 2.2 % — ABNORMAL HIGH (ref 0.0–0.2)

## 2022-10-31 LAB — TYPE AND SCREEN
ABO/RH(D): A POS
Antibody Screen: NEGATIVE
Unit division: 0

## 2022-10-31 LAB — BPAM PLATELET PHERESIS
Blood Product Expiration Date: 202312292359
ISSUE DATE / TIME: 202312271533
Unit Type and Rh: 6200

## 2022-10-31 LAB — PREPARE PLATELET PHERESIS: Unit division: 0

## 2022-10-31 LAB — BPAM RBC
Blood Product Expiration Date: 202401182359
ISSUE DATE / TIME: 202312271706
Unit Type and Rh: 6200

## 2022-10-31 LAB — BASIC METABOLIC PANEL
Anion gap: 11 (ref 5–15)
BUN: 47 mg/dL — ABNORMAL HIGH (ref 8–23)
CO2: 23 mmol/L (ref 22–32)
Calcium: 8.2 mg/dL — ABNORMAL LOW (ref 8.9–10.3)
Chloride: 109 mmol/L (ref 98–111)
Creatinine, Ser: 6.89 mg/dL — ABNORMAL HIGH (ref 0.61–1.24)
GFR, Estimated: 8 mL/min — ABNORMAL LOW (ref >60)
Glucose, Bld: 146 mg/dL — ABNORMAL HIGH (ref 70–99)
Potassium: 3.5 mmol/L (ref 3.5–5.1)
Sodium: 143 mmol/L (ref 135–145)

## 2022-10-31 LAB — MAGNESIUM: Magnesium: 2 mg/dL (ref 1.7–2.4)

## 2022-10-31 LAB — URINE CULTURE

## 2022-10-31 LAB — PHOSPHORUS: Phosphorus: 5.3 mg/dL — ABNORMAL HIGH (ref 2.5–4.6)

## 2022-10-31 LAB — ADAMTS13 ACTIVITY: Adamts 13 Activity: 54.4 % — ABNORMAL LOW (ref >66.8)

## 2022-10-31 LAB — ADAMTS13 ACTIVITY REFLEX

## 2022-10-31 MED ORDER — IPRATROPIUM-ALBUTEROL 0.5-2.5 (3) MG/3ML IN SOLN
3.0000 mL | RESPIRATORY_TRACT | Status: DC
  Administered 2022-10-31 – 2022-11-01 (×5): 3 mL via RESPIRATORY_TRACT
  Filled 2022-10-31 (×5): qty 3

## 2022-10-31 MED ORDER — MOMETASONE FURO-FORMOTEROL FUM 200-5 MCG/ACT IN AERO
2.0000 | INHALATION_SPRAY | Freq: Two times a day (BID) | RESPIRATORY_TRACT | Status: DC
  Administered 2022-10-31 – 2022-11-07 (×10): 2 via RESPIRATORY_TRACT
  Filled 2022-10-31: qty 8.8

## 2022-10-31 MED ORDER — POLYETHYLENE GLYCOL 3350 17 G PO PACK
17.0000 g | PACK | Freq: Every day | ORAL | Status: DC
  Administered 2022-11-01: 17 g via ORAL
  Filled 2022-10-31 (×3): qty 1

## 2022-10-31 MED ORDER — OSELTAMIVIR PHOSPHATE 30 MG PO CAPS
30.0000 mg | ORAL_CAPSULE | Freq: Every day | ORAL | Status: DC
  Administered 2022-11-01 – 2022-11-02 (×2): 30 mg via ORAL
  Filled 2022-10-31 (×2): qty 1

## 2022-10-31 MED ORDER — FUROSEMIDE 10 MG/ML IJ SOLN
120.0000 mg | Freq: Once | INTRAVENOUS | Status: AC
  Administered 2022-10-31: 120 mg via INTRAVENOUS
  Filled 2022-10-31: qty 10

## 2022-10-31 MED ORDER — ACETAMINOPHEN 325 MG PO TABS
650.0000 mg | ORAL_TABLET | Freq: Four times a day (QID) | ORAL | Status: DC | PRN
  Administered 2022-10-31 – 2022-11-06 (×2): 650 mg via ORAL
  Filled 2022-10-31 (×2): qty 2

## 2022-10-31 MED ORDER — NOREPINEPHRINE 4 MG/250ML-% IV SOLN
0.0000 ug/min | INTRAVENOUS | Status: DC
  Administered 2022-10-31 (×2): 15 ug/min via INTRAVENOUS
  Administered 2022-11-01: 16 ug/min via INTRAVENOUS
  Administered 2022-11-01: 6 ug/min via INTRAVENOUS
  Administered 2022-11-01: 16 ug/min via INTRAVENOUS
  Administered 2022-11-01: 12 ug/min via INTRAVENOUS
  Filled 2022-10-31 (×5): qty 250

## 2022-10-31 MED ORDER — QUETIAPINE FUMARATE 50 MG PO TABS
50.0000 mg | ORAL_TABLET | Freq: Two times a day (BID) | ORAL | Status: DC
  Administered 2022-10-31 – 2022-11-07 (×13): 50 mg via ORAL
  Filled 2022-10-31 (×2): qty 1
  Filled 2022-10-31 (×2): qty 2
  Filled 2022-10-31: qty 1
  Filled 2022-10-31 (×3): qty 2
  Filled 2022-10-31: qty 1
  Filled 2022-10-31: qty 2
  Filled 2022-10-31: qty 1
  Filled 2022-10-31 (×4): qty 2

## 2022-10-31 MED ORDER — HALOPERIDOL LACTATE 5 MG/ML IJ SOLN
2.0000 mg | Freq: Two times a day (BID) | INTRAMUSCULAR | Status: DC
  Administered 2022-10-31: 2 mg via INTRAVENOUS
  Filled 2022-10-31: qty 1

## 2022-10-31 MED ORDER — HYDROCORTISONE SOD SUC (PF) 100 MG IJ SOLR
100.0000 mg | Freq: Two times a day (BID) | INTRAMUSCULAR | Status: DC
  Administered 2022-10-31 – 2022-11-03 (×6): 100 mg via INTRAVENOUS
  Filled 2022-10-31 (×6): qty 2

## 2022-10-31 NOTE — Progress Notes (Signed)
[place] notified in due to patient being agitated and combative. Ankle restraints ordered. Elink MD notified, no new orders/sedation at this time. This RN mentioned BiPap not in use as patient was agitated and turning head not allowing RT and nurse to put on Bipap. HFNC at 15 L since start of shift. Patient not following commands or answering questions. MD aware.

## 2022-10-31 NOTE — Progress Notes (Signed)
[facility] Progress Note   Subjective:   remains confused, PD overnight - remains connected but done with treatment  Objective Vitals:   10/31/22 1000 10/31/22 1005 10/31/22 1010 10/31/22 1121  BP: (!) 122/48 (!) 126/46 (!) 118/43 (!) 125/48  Pulse: 81 80 98 86  Resp: '15 17 15 16  '$ Temp:    (S) 99.2 F (37.3 C)  TempSrc:    Oral  SpO2: 100% 100% 100% 99%  Weight:       Physical Exam General: obese man who is sleeping but arousable, in soft restraints Heart: RRR Lungs: dec BS bases, rhonchi ant Abdomen:soft, obese, PD fluid clear Extremities: trace edema   Additional Objective Labs: Basic Metabolic Panel: Recent Labs  Lab 10/29/22 1057 10/29/22 2239 10/30/22 0352 10/30/22 0500 10/31/22 0534  NA 141   < > 142 140 143  K 3.5   < > 3.9 3.6 3.5  CL 108  --   --  107 109  CO2 23  --   --  21* 23  GLUCOSE 113*  --   --  102* 146*  BUN 44*  --   --  42* 47*  CREATININE 7.08*  --   --  7.02* 6.89*  CALCIUM 8.6*  --   --  8.1* 8.2*  PHOS  --   --   --  5.2* 5.3*   < > = values in this interval not displayed.   Liver Function Tests: Recent Labs  Lab 10/29/22 1057  AST 22  ALT 21  ALKPHOS 28*  BILITOT 0.6  PROT 6.6  ALBUMIN 3.2*   No results for input(s): "LIPASE", "AMYLASE" in the last 168 hours. CBC: Recent Labs  Lab 10/29/22 1025 10/29/22 2239 10/30/22 0352 10/30/22 0500 10/30/22 1723 10/31/22 0534  WBC 35.5*  --   --  20.9*  --  13.3*  NEUTROABS 0.4*  --   --   --   --   --   HGB 6.0*   < > 6.1* 6.1*  --  7.0*  HCT 18.4*   < > 18.0* 18.3*  --  21.0*  MCV 110.8*  --   --  107.0*  --  104.0*  PLT 27*  --   --  20* 49* 39*   < > = values in this interval not displayed.   Blood Culture    Component Value Date/Time   SDES PERITONEAL 10/30/2022 1028   SPECREQUEST PERITONEAL DIALYSATE 10/30/2022 1028   CULT  10/30/2022 1028    NO GROWTH < 12 HOURS Performed at [facility], [place] [address], [place], [place] [address]     REPTSTATUS PENDING 10/30/2022 1028    Cardiac Enzymes: No results for input(s): "CKTOTAL", "CKMB", "CKMBINDEX", "TROPONINI" in the last 168 hours. CBG: No results for input(s): "GLUCAP" in the last 168 hours. Iron Studies:  Recent Labs    10/30/22 1723  IRON 91  TIBC 241*  FERRITIN 1,883*   '@lablastinr3'$ @ Studies/Results: EEG adult  Result Date: 10/31/2022 Ernest Havens, MD     10/31/2022 10:47 AM Patient Name: Ernest Barnett MRN: [id number] Epilepsy Attending: Lora Barnett Referring Physician/Provider: Juanito Doom, MD Date: 10/31/2022 Duration: 22.37 mins Patient history: 66 year old male with altered mental status.  EEG to evaluate for seizure. Level of alertness: Awake, drowsy AEDs during EEG study: None Technical aspects: This EEG study was done with scalp electrodes positioned according to the 10-20 International system of electrode placement. Electrical activity was reviewed with  band pass filter of 1-'70Hz'$ , sensitivity of 7 uV/mm, display speed of 35m/sec with a '60Hz'$  notched filter applied as appropriate. EEG data were recorded continuously and digitally stored.  Video monitoring was available and reviewed as appropriate. Description: The posterior dominant rhythm consists of 8 Hz activity of moderate voltage (25-35 uV) seen predominantly in posterior head regions, symmetric and reactive to eye opening and eye closing. Drowsiness was characterized by attenuation of the posterior background rhythm.  EEG showed no regional 3 to 7 Hz theta- delta slowing. Hyperventilation and photic stimulation were not performed.   ABNORMALITY - Intermittent slow, generalized IMPRESSION: This study is suggestive of mild diffuse encephalopathy, nonspecific etiology. No seizures or epileptiform discharges were seen throughout the recording. PLora Barnett  DG CHEST PORT 1 VIEW  Result Date: 10/30/2022 CLINICAL DATA:  Acute respiratory failure with hypoxemia. EXAM: PORTABLE CHEST 1 VIEW  COMPARISON:  Radiographs 10/29/2022, PET-CT 03/08/2021 and chest CT 02/15/2021. FINDINGS: 1039 hours. Lordotic positioning. The heart size and mediastinal contours are stable with aortic atherosclerosis. There is new focal airspace disease in the right infrahilar region which may be within the middle or lower lobe. The left lung is clear. No pleural effusion or pneumothorax. No acute osseous findings are evident. Telemetry leads overlie the chest. IMPRESSION: New right infrahilar airspace disease suspicious for pneumonia. Followup PA and lateral chest X-ray is recommended in 3-4 weeks following trial of antibiotic therapy to ensure resolution and exclude underlying malignancy. Electronically Signed   By: WRichardean SaleM.D.   On: 10/30/2022 11:14   DG C-Arm 1-60 Min  Result Date: 10/30/2022 CLINICAL DATA:  Stent placement EXAM: DG C-ARM 1-60 MIN FLUOROSCOPY: Fluoroscopy Time:  20.6 Radiation Exposure Index (if provided by the fluoroscopic device): 12.2 mGy Number of Acquired Spot Images: 2 FINDINGS: Multiple intraoperative fluoroscopic spot images are provided without a radiologist present. IMPRESSION: Intraoperative fluoroscopic images during stent placement. Electronically Signed   By: TPlacido SouM.D.   On: 10/30/2022 00:57   CT HEAD WO CONTRAST (5MM)  Result Date: 10/29/2022 CLINICAL DATA:  Head trauma, minor. Mental status changes. Combative. EXAM: CT HEAD WITHOUT CONTRAST TECHNIQUE: Contiguous axial images were obtained from the base of the skull through the vertex without intravenous contrast. RADIATION DOSE REDUCTION: This exam was performed according to the departmental dose-optimization program which includes automated exposure control, adjustment of the mA and/or kV according to patient size and/or use of iterative reconstruction technique. COMPARISON:  07/09/2022 FINDINGS: Brain: The study suffers from motion degradation. There is generalized brain atrophy. There chronic small-vessel  ischemic changes throughout the cerebral hemispheric white matter. There is an old right frontal cortical and subcortical infarction. No mass, hemorrhage, hydrocephalus or extra-axial collection. Vascular: There is atherosclerotic calcification of the major vessels at the base of the brain. Skull: Negative Sinuses/Orbits: No significant sinus disease. Globe prosthesis on the right. Other: None IMPRESSION: No acute or traumatic finding. Atrophy and chronic small-vessel ischemic changes of the white matter. 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 Electronically Signed   By: MNelson ChimesM.D.   On: 10/29/2022 15:09   CT ABDOMEN PELVIS WO CONTRAST  Result Date: 10/29/2022 CLINICAL DATA:  Altered mental status and sepsis EXAM: CT ABDOMEN AND PELVIS WITHOUT CONTRAST TECHNIQUE: Multidetector CT imaging of the abdomen and pelvis was performed following the standard protocol without IV contrast. RADIATION DOSE REDUCTION: This exam was performed according to the departmental dose-optimization program which includes automated exposure control, adjustment of the mA and/or kV according to  patient size and/or use of iterative reconstruction technique. COMPARISON:  None Available. FINDINGS: Lower chest: Technically challenging examination due to patient motion artifact. Bilateral lower lobe ground-glass opacities. No pleural effusion or pneumothorax demonstrated. Partially imaged heart size is normal. Coronary artery calcifications. Hepatobiliary: No focal hepatic lesions. No intra or extrahepatic biliary ductal dilation. Cholecystectomy. Pancreas: No focal lesions or main ductal dilation. Spleen: Surgically absent. Adrenals/Urinary Tract: No adrenal nodules. No suspicious renal masses. Mild right hydronephrosis to the level of a 4 mm stone in the distal ureter (3:78). Bilateral simple-appearing cysts. No focal bladder wall thickening. Stomach/Bowel: Small hiatal hernia. Normal appearance of the stomach. No  evidence of bowel wall thickening, distention, or inflammatory changes. The appendix is not discretely seen. Vascular/Lymphatic: Aortic atherosclerosis. No enlarged abdominal or pelvic lymph nodes. Reproductive: Prostate is unremarkable. Other: Peritoneal dialysis catheter present in the lower abdomen. No free air or free fluid. No fluid collections. Musculoskeletal: No acute or abnormal lytic or blastic osseous lesions. 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 IMPRESSION: 1. Challenging examination due to patient motion artifact. 2. Mild right hydronephrosis to the level of a 4 mm stone in the distal ureter. 3. Bilateral lower lobe ground-glass opacities, likely infectious/inflammatory. 4. Coronary artery calcification. Aortic Atherosclerosis (ICD10-I70.0). Electronically Signed   By: Darrin Nipper M.D.   On: 10/29/2022 14:43   Medications:  sodium chloride     dexmedetomidine (PRECEDEX) IV infusion 1.2 mcg/kg/hr (10/31/22 0600)   dialysis solution 1.5% low-MG/low-CA     norepinephrine (LEVOPHED) Adult infusion 2 mcg/min (10/31/22 0801)   piperacillin-tazobactam (ZOSYN)  IV 2.25 g (10/31/22 0757)    sodium chloride   Intravenous Once   Chlorhexidine Gluconate Cloth  6 each Topical Daily   gentamicin cream  1 Application Topical Daily   mouth rinse  15 mL Mouth Rinse Q4H   [START ON 11/01/2022] oseltamivir  30 mg Oral Daily   QUEtiapine  50 mg Oral BID    Assessment/Plan **Septic shock:  febrile, marked leukocytosis.  +influenza. S/p ureteral stent for hydro/24m stone.  UA didn't look like UTI.  CXR with R infiltrate.  12/27 PD fluid not consistent with peritonitis. On tamiflu and pip/tazo per primary.    **ESRD on PD: does PD 6x/wk, off Sat - had PD overnight - will plan for treatment again tonight - using all 1.5% for now; makes good UOP can use diuretics PRN.   Bowel regimen.  Daily PD cath care.     **AHRF:  12/27 extubated and worsening hypoxia.  CXR  with R infiltrate.   On tamiflu and pip/tazo   **Anemia:  Hb in 6s, transfusion per primary.  Iron replete 12/27.  Heme notes from 11/28 show eval - started B12 injections.    Would defer ESA dosing to heme who has been consulted today.    **IgG Kappa MM: f/b Dr. KDelton Coombes current tx Daratumumab SQ + Bortezomib + Dexamethasone (DaraVd) q21d / Daratumumab SQ q28d.   appears blasts noted on AM labs and hematology being consulted   **AMS: per primary; BUN <<100 doubt uremic -- prob delirium; appears has happened in prior admissions   Will follow - call with concerns.     LJannifer HickMD 10/31/2022, 12:15 PM  [facility] Pager: [phone]

## 2022-10-31 NOTE — Progress Notes (Signed)
[facility]  Called from pathology: CBC showed multiple blast like forms  Will consult hematology given his known multiple myeloma  Roselie Awkward, MD [place] [facility] Pager: [phone] Cell: [phone] After 7:00 pm call Elink  [phone]

## 2022-10-31 NOTE — Progress Notes (Signed)
   NAME:  Ernest Barnett, MRN:  [id number], DOB:  [birth year], LOS: 2 ADMISSION DATE:  10/29/2022, CONSULTATION DATE:  12/26 REFERRING MD:  Doren Custard, CHIEF COMPLAINT:  Confusion   History of Present Illness:  66 y/o male with multiple medical problems presented to [facility] with abdominal pain and fever to 103.  Found to be FLU A positive.  Also noted to have 60mmR ureteral stone with hydroureteronephrosis.  Moved to the ICU for sepsis, intubated and take to the OR for R nephrostomy stent and stone removal.    Pertinent  Medical History  COPD ESRD on PD CVA GERD Hyperlipidemia Hypertension Multiple myeloma  Significant Hospital Events: Including procedures, antibiotic start and stop dates in addition to other pertinent events   12/26 admitted, to OR with urology for cystoscopy and right ureteral stent; GPC 1/4 bottles 12/27 extubated, confused, found to have RLL infiltrate, on BIPAP and HHF O2 and precedex.  Peritoneal fluid analysis showed 12 WBC. Changed from ceftriaxone to zosyn.   Interim History / Subjective:  Combative Speaking clearly with abusive language Off BIPAP Breathing comfortably   Objective   Blood pressure (!) 119/52, pulse 81, temperature (!) 100.7 F (38.2 C), temperature source Axillary, resp. rate 19, weight 116.9 kg, SpO2 99 %.    Vent Mode: BIPAP FiO2 (%):  [40 %-50 %] 40 % Set Rate:  [8 bmp] 8 bmp PEEP:  [5 cmH20] 5 cmH20   Intake/Output Summary (Last 24 hours) at 10/31/2022 [id number] data filed at 10/31/2022 0600 Gross per 24 hour  Intake 1812.62 ml  Output 900 ml  Net 912.62 ml   Filed Weights   10/30/22 0130  Weight: 116.9 kg    Examination:  General:  Resting comfortably in bed, intermittently agitated HENT: NCAT OP clear PULM: CTA B, normal effort CV: RRR, no mgr GI: BS+, soft, nontender MSK: normal bulk and tone Neuro: drowsy, speaking clearly, combative (kicked me on exam)   Resolved Hospital Problem list     Assessment & Plan:   Severe sepsis due to: obstructive pyelonephritis, right lower lobe aspiration pneumonia GPC 1/4 bottles> contaminant Zosyn to continue  Monitor urine culture  Acute metabolic encephalopathy with combativeness > daughter says [facility] an [facility] Minimize sedatives Low dose haldol for severe agitation, combativeness and risk to self Change to seroquel when able to take by mouth Precedex if not controlled with haldol  Flu A Tamiflu  ESRD on PD PD per renal Monitor UOP Monitor BMET and UOP Replace electrolytes as needed  Acute on chronic anemia with severe thrombocytopenia Multiple myeloma Monitor for bleeding Transfuse PRBC for Hgb < 7 gm/dL  Swallowing assessment: After haldol bedside [facility] screen  Hypotension: medication related? (Precedex) Levophed as needed for MAP > 65  Best Practice (right click and "Reselect all SmartList Selections" daily)   Diet/type: NPO > advance post [facility] DVT prophylaxis: SCD GI prophylaxis: N/A Lines: N/A Foley:  Yes, and it is no longer needed Code Status:  DNR Last date of multidisciplinary goals of care discussion [12/28: see iPal]     Critical care time: 35 minutes    BRoselie Awkward MD [place] [facility] Pager: [phone]: [phone] 7:00 pm call Elink  [phone]

## 2022-10-31 NOTE — Consult Note (Signed)
New Hematology/Oncology Consult   Requesting MD: Simonne Maffucci       Reason for Consult: Pancytopenia  HPI: Ernest Barnett is followed by Dr. Delton Coombes for treatment of multiple myeloma.  He was diagnosed with multiple myeloma in May 2022.  He has been treated with multiple systemic regimens including CyBorD and daratumumab/Velcade/Decadron.  He has been maintained off of systemic therapy since August of this year.  The serum M spike and serum free kappa light chains were mildly elevated later this month. He was diagnosed with right eye posterior scleritis in September 2023.  He underwent a right eye enucleation on 08/28/2022 at [facility].  Ernest Barnett presented to the emergency room 10/29/2022 with confusion, fever, and cough.  He had a fall on the day of admission.  CT abdomen/pelvis 10/29/2022 revealed right hydronephrosis and a 4 mm stone in the distal ureter.  Her lower lobe groundglass opacities.  He underwent right ureter stent placement 10/29/2022.  He was placed on Zosyn.  An influenza a screen returned positive.  Tamiflu was added.  Returned positive for gram-positive cocci in the aerobic bottle only.  He was admitted to the intensive care unit and extubated on 10/30/2022.  CBC on hospital admission found the hemoglobin at 6, platelets 27,000, WBC 35.5 with an absolute count of 0.4.  I am consulted to evaluate the severe pancytopenia.  A CBC on 10/15/2022 found the hemoglobin at 1.9, platelets 77,000, WBC 15.7, ANC 0.9, and absolute lymphocyte count 13.8.   Past Medical History:  Diagnosis Date   Chronic kidney disease    COPD (chronic obstructive pulmonary disease) ([place])    CVA (cerebral vascular accident) ([place]) 2013   Cystoid macular edema of left eye 03/07/2021   GERD (gastroesophageal reflux disease)    HOH (hard of hearing)    Hypercholesteremia    Hypertension    Left epiretinal membrane 03/07/2021   Pseudophakia 03/07/2021   Stroke ([place]) 03/24/2012   left sided weakness    Vitamin D deficiency    .  Right eye posterior scleritis September 2023     Past Surgical History:  Procedure Laterality Date   A/V FISTULAGRAM Right 01/29/2022   Procedure: A/V Fistulagram;  Surgeon: Serafina Mitchell, MD;  Location: [place] [facility];  Service: Cardiovascular;  Laterality: Right;   AV FISTULA PLACEMENT Right 02/08/2021   Procedure: RIGHT ARM ARTERIOVENOUS (AV) FISTULA CREATION;  Surgeon: [name], MD;  Location: [facility];  Service: Vascular;  Laterality: Right;   BIOPSY  11/12/2018   Procedure: BIOPSY;  Surgeon: Rogene Houston, MD;  Location: [facility];  Service: Endoscopy;;  colon   BIOPSY  05/10/2020   Procedure: BIOPSY;  Surgeon: Rogene Houston, MD;  [facility];  Service: Endoscopy;;  esophagus   CATARACT EXTRACTION     right eye   CATARACT EXTRACTION W/PHACO  10/08/2012   Procedure: CATARACT EXTRACTION PHACO AND INTRAOCULAR LENS PLACEMENT ([place]);  Surgeon: Tonny Branch, MD;  Location: [facility];  Service: Ophthalmology;  Laterality: Left;  CDE:6.64   CHOLECYSTECTOMY     [place]   COLONOSCOPY N/A 11/12/2018   Procedure: COLONOSCOPY;  Surgeon: Rogene Houston, MD;  [facility];  Service: Endoscopy;  Laterality: N/A;  [place], URETEROSCOPY AND STENT PLACEMENT Right 10/29/2022   Procedure: CYSTOSCOPY , URETEROSCOPY AND STENT PLACEMENT RIGHT;  Surgeon: Ardis Hughs, MD;  Location: [facility];  Service: Urology;  Laterality: Right;   ELBOW FRACTURE SURGERY  left   ESOPHAGEAL DILATION N/A 11/12/2018   Procedure: ESOPHAGEAL DILATION;  Surgeon: Rogene Houston, MD;  [facility];  Service: Endoscopy;  Laterality: N/A;   ESOPHAGEAL DILATION N/A 05/10/2020   Procedure: ESOPHAGEAL DILATION;  Surgeon: Rogene Houston, MD;  [facility];  Service: Endoscopy;  Laterality: N/A;   ESOPHAGOGASTRODUODENOSCOPY N/A 11/12/2018   Procedure: ESOPHAGOGASTRODUODENOSCOPY (EGD);  Surgeon: Rogene Houston, MD;   [facility];  Service: Endoscopy;  Laterality: N/A;   ESOPHAGOGASTRODUODENOSCOPY N/A 05/10/2020   Procedure: ESOPHAGOGASTRODUODENOSCOPY (EGD);  Surgeon: Rogene Houston, MD;  [facility];  Service: Endoscopy;  Laterality: N/A;  210   HERNIA REPAIR     right inguinal   HYDROCELE EXCISION / REPAIR     IR FLUORO GUIDE CV LINE LEFT  08/17/2021   IR US GUIDE VASC ACCESS LEFT  08/17/2021   LIGATION OF COMPETING BRANCHES OF ARTERIOVENOUS FISTULA Right 09/11/2021   Procedure: LIGATION OF COMPETING BRANCHES OF RIGHT ARM ARTERIOVENOUS FISTULA;  Surgeon: [name], MD;  Location: [facility];  Service: Vascular;  Laterality: Right;   PERIPHERAL VASCULAR BALLOON ANGIOPLASTY Right 01/29/2022   Procedure: PERIPHERAL VASCULAR BALLOON ANGIOPLASTY;  Surgeon: Serafina Mitchell, MD;  Location: [place] [facility];  Service: Cardiovascular;  Laterality: Right;  arm fistula   POLYPECTOMY  11/12/2018   Procedure: POLYPECTOMY;  Surgeon: Rogene Houston, MD;  [facility];  Service: Endoscopy;;  colon    RETINAL DETACHMENT SURGERY Left 2019   SPLENECTOMY, TOTAL     TONSILLECTOMY     VOCAL CORD INJECTION     removal of polyp-2005  :   Current Facility-Administered Medications:    0.9 %  sodium chloride infusion (Manually program via Guardrails IV Fluids), , Intravenous, Once, Desai, Rahul P, PA-C   0.9 %  sodium chloride infusion, 250 mL, Intravenous, Continuous, Ogan, Okoronkwo U, MD   acetaminophen (TYLENOL) suppository 650 mg, 650 mg, Rectal, Q4H PRN, Frederik Pear, MD, 650 mg at 10/30/22 2019   acetaminophen (TYLENOL) tablet 650 mg, 650 mg, Oral, Q6H PRN, Simonne Maffucci B, MD, 650 mg at 10/31/22 1622   Chlorhexidine Gluconate Cloth 2 % PADS 6 each, 6 each, Topical, Daily, Chand, Sudham, MD, 6 each at 10/31/22 0229   dexmedetomidine (PRECEDEX) 400 MCG/100ML (4 mcg/mL) infusion, 0-1.2 mcg/kg/hr, Intravenous, Titrated, Desai, Rahul P, PA-C, Last Rate: 17.54 mL/hr at 10/31/22  1234, 0.6 mcg/kg/hr at 10/31/22 1234   dialysis solution 1.5% low-MG/low-CA dianeal solution, , Intraperitoneal, Q24H, Justin Mend, MD, [place] at 10/30/22 1855   docusate sodium (COLACE) capsule 100 mg, 100 mg, Oral, BID PRN, Jacky Kindle, MD   fentaNYL (SUBLIMAZE) injection 25 mcg, 25 mcg, Intravenous, Q2H PRN, Countryman, Chase, MD, 25 mcg at 10/29/22 1539   furosemide (LASIX) 120 mg in dextrose 5 % 50 mL IVPB, 120 mg, Intravenous, Once, McQuaid, Naren B, MD   gentamicin cream (GARAMYCIN) 0.1 % 1 Application, 1 Application, Topical, Daily, Justin Mend, MD, 1 Application at 95/18/84 1850   hydrocortisone sodium succinate (SOLU-CORTEF) 100 MG injection 100 mg, 100 mg, Intravenous, Q12H, McQuaid, Ovadia B, MD, 100 mg at 10/31/22 1716   iohexol (OMNIPAQUE) 300 MG/ML solution 100 mL, 100 mL, Intravenous, Once PRN, Countryman, Chase, MD   ipratropium-albuterol (DUONEB) 0.5-2.5 (3) MG/3ML nebulizer solution 3 mL, 3 mL, Nebulization, Q4H, McQuaid, Trebor B, MD, 3 mL at 10/31/22 1731   mometasone-formoterol (DULERA) 200-5 MCG/ACT inhaler 2 puff, 2 puff, Inhalation, BID, McQuaid,  Ronie Spies, MD   norepinephrine (LEVOPHED) 98m in 253m(0.016 mg/mL) premix infusion, 2-10 mcg/min, Intravenous, Titrated, Ogan, Okoronkwo U, MD, Last Rate: 11.3 mL/hr at 10/31/22 1445, 3.013 mcg/min at 10/31/22 1445   Oral care mouth rinse, 15 mL, Mouth Rinse, PRN, Ogan, OkKerry KassMD   Oral care mouth rinse, 15 mL, Mouth Rinse, Q4H, Ogan, Okoronkwo U, MD, 15 mL at 10/31/22 1600   [START ON 11/01/2022] oseltamivir (TAMIFLU) capsule 30 mg, 30 mg, Oral, Daily, McQuaid, Elo B, MD   piperacillin-tazobactam (ZOSYN) IVPB 2.25 g, 2.25 g, Intravenous, Q8H, Desai, Rahul P, PA-C, Last Rate: 100 mL/hr at 10/31/22 1444, 2.25 g at 10/31/22 1444   polyethylene glycol (MIRALAX / GLYCOLAX) packet 17 g, 17 g, Oral, Daily, KrJustin MendMD   QUEtiapine (SEROQUEL) tablet 50 mg, 50 mg, Oral, BID, McQuaid, Mahamed B, MD, 50  mg at 10/31/22 1235  Facility-Administered Medications Ordered in Other Encounters:    palonosetron (ALOXI) 0.25 MG/5ML injection, , , , :   sodium chloride   Intravenous Once   Chlorhexidine Gluconate Cloth  6 each Topical Daily   gentamicin cream  1 Application Topical Daily   hydrocortisone sodium succinate  100 mg Intravenous Q12H   ipratropium-albuterol  3 mL Nebulization Q4H   mometasone-formoterol  2 puff Inhalation BID   mouth rinse  15 mL Mouth Rinse Q4H   [START ON 11/01/2022] oseltamivir  30 mg Oral Daily   polyethylene glycol  17 g Oral Daily   QUEtiapine  50 mg Oral BID  :  No Known Allergies:  FH: Mother had small cell lung cancer  SOCIAL HISTORY: Lives with his significant other in [place] He is a retired trAdministrator He has a remote history of tobacco use.  Review of Systems: Unable to obtain as patient is confused    Physical Exam:  Blood pressure 106/65, pulse (!) 108, temperature (!) 101.4 F (38.6 C), temperature source Axillary, resp. rate (!) 35, weight 257 lb 11.5 oz (116.9 kg), SpO2 100 %.  HEENT: Status post right eye enucleation Lungs: Clear anteriorly, no respiratory distress Cardiac: Distant heart sounds Abdomen: No hepatosplenomegaly, right lower abdomen peritoneal dialysis catheter site without evidence of infection  Vascular: Leg edema Lymph nodes: No cervical, supraclavicular, axillary, or inguinal nodes Neurologic: Lethargic, arousable, follows a few simple commands, not conversant, confused  LABS:   Recent Labs    10/30/22 0500 10/30/22 1723 10/31/22 0534  WBC 20.9*  --  13.3*  HGB 6.1*  --  7.0*  HCT 18.3*  --  21.0*  PLT 20* 49* 39*   Blood Smear: The platelets are decreased in number.  No platelet clumps.  Scattered large platelets.  There are ovalocytes, teardrops, rare schistocytes.  Rare nucleated red cell.  No neutrophils seen.  The majority the white cells are mononuclear cells with plasmacytoid features.  Some of the  cells are binucleate and a few have the appearance of blasts.  Recent Labs    10/30/22 0500 10/31/22 0534  NA 140 143  K 3.6 3.5  CL 107 109  CO2 21* 23  GLUCOSE 102* 146*  BUN 42* 47*  CREATININE 7.02* 6.89*  CALCIUM 8.1* 8.2*      RADIOLOGY:  EEG adult  Result Date: 10/31/2022 YaLora HavensMD     10/31/2022 10:47 AM Patient Name: DoOIVA DIBARIRN: [id number] Attending: PrLora Havenseferring Physician/Provider: McJuanito DoomMD Date: 10/31/2022 Duration: 22.37 mins Patient history: [age]ear old male  with altered mental status.  EEG to evaluate for seizure. Level of alertness: Awake, drowsy AEDs during EEG study: None Technical aspects: This EEG study was done with scalp electrodes positioned according to the 10-20 International system of electrode placement. Electrical activity was reviewed with band pass filter of 1-_0 , sensitivity of 7 uV/mm, display speed of 67m/sec with a _1  notched filter applied as appropriate. EEG data were recorded continuously and digitally stored.  Video monitoring was available and reviewed as appropriate. Description: The posterior dominant rhythm consists of 8 Hz activity of moderate voltage (25-35 uV) seen predominantly in posterior head regions, symmetric and reactive to eye opening and eye closing. Drowsiness was characterized by attenuation of the posterior background rhythm.  EEG showed no regional 3 to 7 Hz theta- delta slowing. Hyperventilation and photic stimulation were not performed.   ABNORMALITY - Intermittent slow, generalized IMPRESSION: This study is suggestive of mild diffuse encephalopathy, nonspecific etiology. No seizures or epileptiform discharges were seen throughout the recording. PLora Havens  DG CHEST PORT 1 VIEW  Result Date: 10/30/2022 CLINICAL DATA:  Acute respiratory failure with hypoxemia. EXAM: PORTABLE CHEST 1 VIEW COMPARISON:  Radiographs 10/29/2022, PET-CT 03/08/2021 and chest CT  02/15/2021. FINDINGS: 1039 hours. Lordotic positioning. The heart size and mediastinal contours are stable with aortic atherosclerosis. There is new focal airspace disease in the right infrahilar region which may be within the middle or lower lobe. The left lung is clear. No pleural effusion or pneumothorax. No acute osseous findings are evident. Telemetry leads overlie the chest. IMPRESSION: New right infrahilar airspace disease suspicious for pneumonia. Followup PA and lateral chest X-ray is recommended in 3-4 weeks following trial of antibiotic therapy to ensure resolution and exclude underlying malignancy. Electronically Signed   By: WRichardean SaleM.D.   On: 10/30/2022 11:14   DG C-Arm 1-60 Min  Result Date: 10/30/2022 CLINICAL DATA:  Stent placement EXAM: DG C-ARM 1-60 MIN FLUOROSCOPY: Fluoroscopy Time:  20.6 Radiation Exposure Index (if provided by the fluoroscopic device): 12.2 mGy Number of Acquired Spot Images: 2 FINDINGS: Multiple intraoperative fluoroscopic spot images are provided without a radiologist present. IMPRESSION: Intraoperative fluoroscopic images during stent placement. Electronically Signed   By: TPlacido SouM.D.   On: 10/30/2022 00:57   CT HEAD WO CONTRAST (5MM)  Result Date: 10/29/2022 CLINICAL DATA:  Head trauma, minor. Mental status changes. Combative. EXAM: CT HEAD WITHOUT CONTRAST TECHNIQUE: Contiguous axial images were obtained from the base of the skull through the vertex without intravenous contrast. RADIATION DOSE REDUCTION: This exam was performed according to the departmental dose-optimization program which includes automated exposure control, adjustment of the mA and/or kV according to patient size and/or use of iterative reconstruction technique. COMPARISON:  07/09/2022 FINDINGS: Brain: The study suffers from motion degradation. There is generalized brain atrophy. There chronic small-vessel ischemic changes throughout the cerebral hemispheric white matter. There  is an old right frontal cortical and subcortical infarction. No mass, hemorrhage, hydrocephalus or extra-axial collection. Vascular: There is atherosclerotic calcification of the major vessels at the base of the brain. Skull: Negative Sinuses/Orbits: No significant sinus disease. Globe prosthesis on the right. Other: None IMPRESSION: No acute or traumatic finding. Atrophy and chronic small-vessel ischemic changes of the white matter. 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 Electronically Signed   By: MNelson ChimesM.D.   On: 10/29/2022 15:09   CT ABDOMEN PELVIS WO CONTRAST  Result Date: 10/29/2022 CLINICAL DATA:  Altered mental status and sepsis EXAM: CT ABDOMEN AND PELVIS WITHOUT  CONTRAST TECHNIQUE: Multidetector CT imaging of the abdomen and pelvis was performed following the standard protocol without IV contrast. RADIATION DOSE REDUCTION: This exam was performed according to the departmental dose-optimization program which includes automated exposure control, adjustment of the mA and/or kV according to patient size and/or use of iterative reconstruction technique. COMPARISON:  None Available. FINDINGS: Lower chest: Technically challenging examination due to patient motion artifact. Bilateral lower lobe ground-glass opacities. No pleural effusion or pneumothorax demonstrated. Partially imaged heart size is normal. Coronary artery calcifications. Hepatobiliary: No focal hepatic lesions. No intra or extrahepatic biliary ductal dilation. Cholecystectomy. Pancreas: No focal lesions or main ductal dilation. Spleen: Surgically absent. Adrenals/Urinary Tract: No adrenal nodules. No suspicious renal masses. Mild right hydronephrosis to the level of a 4 mm stone in the distal ureter (3:78). Bilateral simple-appearing cysts. No focal bladder wall thickening. Stomach/Bowel: Small hiatal hernia. Normal appearance of the stomach. No evidence of bowel wall thickening, distention, or inflammatory changes. The  appendix is not discretely seen. Vascular/Lymphatic: Aortic atherosclerosis. No enlarged abdominal or pelvic lymph nodes. Reproductive: Prostate is unremarkable. Other: Peritoneal dialysis catheter present in the lower abdomen. No free air or free fluid. No fluid collections. Musculoskeletal: No acute or abnormal lytic or blastic osseous lesions. 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 IMPRESSION: 1. Challenging examination due to patient motion artifact. 2. Mild right hydronephrosis to the level of a 4 mm stone in the distal ureter. 3. Bilateral lower lobe ground-glass opacities, likely infectious/inflammatory. 4. Coronary artery calcification. Aortic Atherosclerosis (ICD10-I70.0). Electronically Signed   By: Darrin Nipper M.D.   On: 10/29/2022 14:43   DG Chest Portable 1 View  Result Date: 10/29/2022 CLINICAL DATA:  Fever.  Sepsis. EXAM: PORTABLE CHEST 1 VIEW COMPARISON:  CT 02/15/2021 FINDINGS: Cardiomegaly. Lordotic positioning. Upper lungs are clear. No lower lobe dense consolidation or collapse. Cannot rule out minimal patchy density at the lung bases. No pleural effusion. No bone finding. IMPRESSION: Cardiomegaly. Lordotic positioning. Cannot rule out minimal patchy density at the lung bases. No dense consolidation or collapse. Electronically Signed   By: Nelson Chimes M.D.   On: 10/29/2022 10:55    Assessment and Plan:   Admission with sepsis syndrome Influenza A/pneumonia 1 blood culture positive for gram-positive cocci in the aerobic bottle only IgG kappa multiple myeloma, currently maintained off of specific therapy Peripheral blood lymphocytosis consistent with plasmacytosis Severe neutropenia, anemia, and thrombocytopenia End-stage renal disease maintained on peritoneal hemodialysis Right posterior scleritis September 2023, status post right eye enucleation 08/28/2022 COPD Hypertension Status post splenectomy Mild right hydronephrosis/right ureter  stone on CT abdomen/pelvis 10/29/2022, placement of right ureter stent 10/30/2022  Ernest Barnett has a complex medical history including multiple myeloma and end-stage renal disease maintained on peritoneal dialysis.  He is admitted with sepsis syndrome secondary to influenza A/pneumonia and a possible urinary tract infection.  He remains febrile, confused, and hypoxic.  He has severe neutropenia, anemia, and thrombocytopenia.  Review of the peripheral blood smear is consistent with development of plasma cell leukemia.  I suspect the severe cytopenias are related to progression of the multiple myeloma. I discussed his current status and the poor prognosis with son and daughter at the bedside.  They understand Ernest Barnett may not survive the current admission.  He may be a candidate for salvage systemic therapy for treatment of the myeloma/plasma cell leukemia if he survives the acute febrile illness.  Recommendations: Management of sepsis syndrome per critical care medicine continue antibiotics and supportive care  COPD/hypoxia management per critical care medicine Transfuse platelets for count of less than 10,000 or bleeding Consider G-CSF support if the fever/hypotension persist Diagnostic bone marrow biopsy and peripheral blood flow cytometry if his clinical status improves Salvage systemic therapy treatment of plasma cell leukemia if his clinical status improves  Nephrology consult as needed I will check on him 11/01/2022 and I will alert Dr. Delton Coombes hospital admission   Betsy Coder, MD 10/31/2022, 6:34 PM

## 2022-10-31 NOTE — Progress Notes (Signed)
[facility]  Worsening agitation shock this evening  Peripheral smear reviewed by hematology: picture consistent with plasma cell leukemia, poor prognosis  Family updated at length at bedside  With worsening condition and poor prognosis discussed moving to comfort measures if he continues to worsen tonight.  Will give lasix now Make NPO PD tonight  If no improvement or if worsening dyspnea/air hunger then need to move to comfort measures: stopping levophed, starting fetanyl for confusion  Daughter and Son updated bedside and agree  Roselie Awkward, MD [place] [facility] Pager: [phone] Cell: [phone] After 7:00 pm call [place]  [phone]

## 2022-10-31 NOTE — Progress Notes (Signed)
Pt in bed resting family at bedside pt stable no distress noted vitals stable     10/31/22 1846  Peritoneal Catheter Right lower abdomen Continuous cycling  No placement date or time found.   Catheter Location: Right lower abdomen  Dialysis Type: Continuous cycling  Site Assessment Clean, Dry, Intact  Drainage Description None  Catheter status Accessed;Patent  Dressing Gauze/Drain sponge  Dressing Status Clean, Dry, Intact  Dressing Intervention Dressing changed  Cycler Setup  Total Number of Night Cycles 4  Night Fill Volume 1700  Dianeal Solution Dextrose 1.5% in 6000 mL Low Cal/Low Mag  Night Dwell Time per Cycle - Hour(s) 2  Night Time Therapy - Minute(s) 43  Night Time Therapy - Hour(s) 9  Minimum Initial Drain Volume 0  Maximum Peritoneal Volume 1700  Night/Total Therapy Volume 6800

## 2022-10-31 NOTE — Procedures (Signed)
Patient Name: Ernest Barnett  MRN: [id number]  Epilepsy Attending: [name]  Referring Physician/Provider: Juanito Doom, MD  Date: 10/31/2022 Duration: 22.37 mins  Patient history: 66 year old male with altered mental status.  EEG to evaluate for seizure.  Level of alertness: Awake, drowsy  AEDs during EEG study: None  Technical aspects: This EEG study was done with scalp electrodes positioned according to the 10-20 International system of electrode placement. Electrical activity was reviewed with band pass filter of 1-'70Hz'$ , sensitivity of 7 uV/mm, display speed of 50m/sec with a '60Hz'$  notched filter applied as appropriate. EEG data were recorded continuously and digitally stored.  Video monitoring was available and reviewed as appropriate.  Description: The posterior dominant rhythm consists of 8 Hz activity of moderate voltage (25-35 uV) seen predominantly in posterior head regions, symmetric and reactive to eye opening and eye closing. Drowsiness was characterized by attenuation of the posterior background rhythm.  EEG showed no regional 3 to 7 Hz theta- delta slowing. Hyperventilation and photic stimulation were not performed.     ABNORMALITY - Intermittent slow, generalized  IMPRESSION: This study is suggestive of mild diffuse encephalopathy, nonspecific etiology. No seizures or epileptiform discharges were seen throughout the recording.  Anderia Lorenzo OBarbra Sarks

## 2022-10-31 NOTE — Progress Notes (Signed)
EEG complete - results pending 

## 2022-11-01 ENCOUNTER — Other Ambulatory Visit: Payer: Self-pay | Admitting: Hematology

## 2022-11-01 DIAGNOSIS — D696 Thrombocytopenia, unspecified: Secondary | ICD-10-CM

## 2022-11-01 DIAGNOSIS — N138 Other obstructive and reflux uropathy: Secondary | ICD-10-CM | POA: Diagnosis not present

## 2022-11-01 DIAGNOSIS — N200 Calculus of kidney: Secondary | ICD-10-CM | POA: Diagnosis not present

## 2022-11-01 LAB — DIFFERENTIAL
Abs Immature Granulocytes: 0 10*3/uL (ref 0.00–0.07)
Basophils Absolute: 0.2 10*3/uL — ABNORMAL HIGH (ref 0.0–0.1)
Basophils Relative: 1 %
Eosinophils Absolute: 0 10*3/uL (ref 0.0–0.5)
Eosinophils Relative: 0 %
Lymphocytes Relative: 21 %
Lymphs Abs: 3.4 10*3/uL (ref 0.7–4.0)
Monocytes Absolute: 0.5 10*3/uL (ref 0.1–1.0)
Monocytes Relative: 3 %
Neutro Abs: 0.2 10*3/uL — CL (ref 1.7–7.7)
Neutrophils Relative %: 1 %
Other: 74 %

## 2022-11-01 LAB — CBC
HCT: 22.4 % — ABNORMAL LOW (ref 39.0–52.0)
Hemoglobin: 7.6 g/dL — ABNORMAL LOW (ref 13.0–17.0)
MCH: 34.1 pg — ABNORMAL HIGH (ref 26.0–34.0)
MCHC: 33.9 g/dL (ref 30.0–36.0)
MCV: 100.4 fL — ABNORMAL HIGH (ref 80.0–100.0)
Platelets: 33 10*3/uL — ABNORMAL LOW (ref 150–400)
RBC: 2.23 MIL/uL — ABNORMAL LOW (ref 4.22–5.81)
RDW: 25.7 % — ABNORMAL HIGH (ref 11.5–15.5)
WBC: 16.1 10*3/uL — ABNORMAL HIGH (ref 4.0–10.5)
nRBC: 1.6 % — ABNORMAL HIGH (ref 0.0–0.2)

## 2022-11-01 LAB — GLUCOSE, CAPILLARY: Glucose-Capillary: 157 mg/dL — ABNORMAL HIGH (ref 70–99)

## 2022-11-01 LAB — BASIC METABOLIC PANEL
Anion gap: 17 — ABNORMAL HIGH (ref 5–15)
BUN: 45 mg/dL — ABNORMAL HIGH (ref 8–23)
CO2: 22 mmol/L (ref 22–32)
Calcium: 8.5 mg/dL — ABNORMAL LOW (ref 8.9–10.3)
Chloride: 104 mmol/L (ref 98–111)
Creatinine, Ser: 6.4 mg/dL — ABNORMAL HIGH (ref 0.61–1.24)
GFR, Estimated: 9 mL/min — ABNORMAL LOW (ref >60)
Glucose, Bld: 158 mg/dL — ABNORMAL HIGH (ref 70–99)
Potassium: 2.9 mmol/L — ABNORMAL LOW (ref 3.5–5.1)
Sodium: 143 mmol/L (ref 135–145)

## 2022-11-01 MED ORDER — IPRATROPIUM-ALBUTEROL 0.5-2.5 (3) MG/3ML IN SOLN
3.0000 mL | Freq: Three times a day (TID) | RESPIRATORY_TRACT | Status: DC
  Administered 2022-11-01 – 2022-11-04 (×5): 3 mL via RESPIRATORY_TRACT
  Filled 2022-11-01 (×8): qty 3

## 2022-11-01 MED ORDER — TBO-FILGRASTIM 480 MCG/0.8ML ~~LOC~~ SOSY
480.0000 ug | PREFILLED_SYRINGE | Freq: Every day | SUBCUTANEOUS | Status: DC
  Administered 2022-11-01 – 2022-11-03 (×3): 480 ug via SUBCUTANEOUS
  Filled 2022-11-01 (×3): qty 0.8

## 2022-11-01 MED ORDER — POTASSIUM CHLORIDE 10 MEQ/100ML IV SOLN
10.0000 meq | INTRAVENOUS | Status: AC
  Administered 2022-11-01 (×4): 10 meq via INTRAVENOUS
  Filled 2022-11-01 (×4): qty 100

## 2022-11-01 MED ORDER — IPRATROPIUM-ALBUTEROL 0.5-2.5 (3) MG/3ML IN SOLN
3.0000 mL | RESPIRATORY_TRACT | Status: DC | PRN
  Administered 2022-11-03: 3 mL via RESPIRATORY_TRACT

## 2022-11-01 NOTE — Progress Notes (Signed)
Upon 2000 assessment of patient, patient receiving peritoneal dialysis with no complications.  Around 2130, peritoneal dialysis machine kept alarming "insufficient drainage amount." Spoke with hemodialysis nurse Cardell Peach who advised the appropriate next step was to call the tech support line. Patient receiving 1st of 4 cycles. Tech support called by this Therapist, sports. Checked for kinks in line, re-positioned patient, and added one cycle to the machine per advisement of tech support. At this time peritoneal dialysis machine began to run smoothly. Patient now receiving 2nd of five cycles.   This RN followed up with hemodialysis nurse to inform her of progress. She advised me, with permission from her manager, to hit "end treatment" if machine began to alarm again. Was also notified that no hemodialysis nurse would be staffed after 0000.   0242 - Peritoneal dialysis machine alarming. "End treatment" selected by this RN. Patient remains hooked up to machine.   Below are summary numbers that may be needed for hemodialysis nurse coming onto shift in the morning:   Therapy Deviation Lost Dwell Time: 179 min Lost Therapy Volume: 2549 ml Expected UF Deviation: -978m  Treatment Complete Initial drain volume: 1 mL Average dwell time: 100 min Average drain time: 46 min Total therapy volume: 4251 mL Total UF: -931 mL Day UF: 0 mL Night UF: -931 UF Total therapy time: 7 hr 43 min    Care ongoing. SWyn Quaker RN

## 2022-11-01 NOTE — Progress Notes (Signed)
IP PROGRESS NOTE  Subjective:   [name] is alert this morning.  He appears agitated.  Objective: Vital signs in last 24 hours: Blood pressure 121/69, pulse 87, temperature 100.1 F (37.8 C), temperature source Axillary, resp. rate 17, weight 257 lb 11.5 oz (116.9 kg), SpO2 93 %.  Intake/Output from previous day: 12/28 0701 - 12/29 0700 In: 6426.1 [I.V.:979.4; Blood:5103.3; IV Piggyback:258.4] Out: 2825 [Urine:2825]  Physical Exam:  HEENT: No thrush or bleeding Lungs: Bilateral expiratory wheeze at the anterior chest, no respiratory distress Cardiac: Distant heart sounds Abdomen: Nontender, no hepatosplenomegaly, right abdomen dialysis catheter site without evidence of infection Extremities: No leg edema Neurologic: Alert, hard of hearing, agitated, follows some simple commands   Lab Results: Recent Labs    10/31/22 0534 11/01/22 0438  WBC 13.3* 16.1*  HGB 7.0* 7.6*  HCT 21.0* 22.4*  PLT 39* 33*    BMET Recent Labs    10/31/22 0534 11/01/22 0438  NA 143 143  K 3.5 2.9*  CL 109 104  CO2 23 22  GLUCOSE 146* 158*  BUN 47* 45*  CREATININE 6.89* 6.40*  CALCIUM 8.2* 8.5*    No results found for: "CEA1", "CEA", "MVH846", "CA125"  Studies/Results: EEG adult  Result Date: 10/31/2022 [name], MD     10/31/2022 10:47 AM Patient Name: Ernest Barnett MRN: [id number] Epilepsy Attending: [name] Referring Physician/Provider: Juanito Doom, MD Date: 10/31/2022 Duration: 22.37 mins Patient history: 66 year old male with altered mental status.  EEG to evaluate for seizure. Level of alertness: Awake, drowsy AEDs during EEG study: None Technical aspects: This EEG study was done with scalp electrodes positioned according to the 10-20 International system of electrode placement. Electrical activity was reviewed with band pass filter of 1-_0 , sensitivity of 7 uV/mm, display speed of 61m/sec with a _1  notched filter applied as appropriate. EEG data were  recorded continuously and digitally stored.  Video monitoring was available and reviewed as appropriate. Description: The posterior dominant rhythm consists of 8 Hz activity of moderate voltage (25-35 uV) seen predominantly in posterior head regions, symmetric and reactive to eye opening and eye closing. Drowsiness was characterized by attenuation of the posterior background rhythm.  EEG showed no regional 3 to 7 Hz theta- delta slowing. Hyperventilation and photic stimulation were not performed.   ABNORMALITY - Intermittent slow, generalized IMPRESSION: This study is suggestive of mild diffuse encephalopathy, nonspecific etiology. No seizures or epileptiform discharges were seen throughout the recording. [name]   Medications: I have reviewed the patient's current medications.  Assessment/Plan:  Admission with sepsis syndrome Influenza A/pneumonia 1 blood culture positive for gram-positive cocci in the aerobic bottle only IgG kappa multiple myeloma, currently maintained off of specific therapy Peripheral blood lymphocytosis consistent with plasmacytosis Severe neutropenia, anemia, and thrombocytopenia End-stage renal disease maintained on peritoneal hemodialysis Right posterior scleritis September 2023, status post right eye enucleation 08/28/2022 COPD Hypertension Status post splenectomy Mild right hydronephrosis/right ureter stone on CT abdomen/pelvis 10/29/2022, placement of right ureter stent 10/30/2022 Encephalopathy secondary to critical illness   Mr. SDubrayappears more alert today.  He is stable from a hematologic standpoint.  The white cell differential from this morning is pending.  I recommend beginning G-CSF support for an ANC of less than 0.5.  He should continue supportive care measures and management of the sepsis syndrome per critical care medicine.  I alerted Dr. KDelton Coombesof the current hospital admission.  Mr. SAlgernon Huxleymay be a candidate for salvage treatment of the  myeloma/plasma cell leukemia if he recovers from the current hospital admission.  Recommendations: Supportive care/management of sepsis syndrome per critical care medicine Transfuse platelets for a count of less than 10,000 or bleeding, transfuse packed red cells as needed for symptomatic anemia Begin G-CSF for a neutrophil count of less than 500 and continue G-CSF until the neutrophil count returns at greater than 1000, neutrophil count pending today Management of end-stage renal failure per nephrology Follow-up peripheral blood flow cytometry Please call hematology over the weekend as needed.  We will check on him next week   LOS: 3 days   Betsy Coder, MD   11/01/2022, 11:25 AM

## 2022-11-01 NOTE — Progress Notes (Signed)
[facility] Progress Note   Subjective:   remains confused, PD overnight - per charting, issues with alarms overnight, treatment terminated at 2:42am. On levo currently. Not allowing me to examine his abdomen.  Objective Vitals:   11/01/22 0715 11/01/22 0800 11/01/22 1100 11/01/22 1105  BP: (!) 164/66  121/69   Pulse: 90  80 87  Resp: '16  19 17  '$ Temp:  100.1 F (37.8 C)    TempSrc:  Axillary    SpO2: 96%  99% 93%  Weight:       Physical Exam General: obese man, confused/agitated Heart: RRR Lungs: dec BS bases, rhonchi ant Abdomen:soft, obese, nontender on palpation, patient not allowing me to fully examined abdomen Extremities: trace edema Neuro: confused/agitated   Additional Objective Labs: Basic Metabolic Panel: Recent Labs  Lab 10/30/22 0500 10/31/22 0534 11/01/22 0438  NA 140 143 143  K 3.6 3.5 2.9*  CL 107 109 104  CO2 21* 23 22  GLUCOSE 102* 146* 158*  BUN 42* 47* 45*  CREATININE 7.02* 6.89* 6.40*  CALCIUM 8.1* 8.2* 8.5*  PHOS 5.2* 5.3*  --    Liver Function Tests: Recent Labs  Lab 10/29/22 1057  AST 22  ALT 21  ALKPHOS 28*  BILITOT 0.6  PROT 6.6  ALBUMIN 3.2*   No results for input(s): "LIPASE", "AMYLASE" in the last 168 hours. CBC: Recent Labs  Lab 10/29/22 1025 10/29/22 2239 10/30/22 0500 10/30/22 1723 10/31/22 0534 11/01/22 0438  WBC 35.5*  --  20.9*  --  13.3* 16.1*  NEUTROABS 0.4*  --   --   --   --   --   HGB 6.0*   < > 6.1*  --  7.0* 7.6*  HCT 18.4*   < > 18.3*  --  21.0* 22.4*  MCV 110.8*  --  107.0*  --  104.0* 100.4*  PLT 27*  --  20* 49* 39* 33*   < > = values in this interval not displayed.   Blood Culture    Component Value Date/Time   SDES PERITONEAL 10/30/2022 1028   SPECREQUEST PERITONEAL DIALYSATE 10/30/2022 1028   CULT  10/30/2022 1028    NO GROWTH 2 DAYS Performed at [facility], [place] [address] [place], [place] [address]    REPTSTATUS PENDING 10/30/2022 1028    Cardiac Enzymes: No  results for input(s): "CKTOTAL", "CKMB", "CKMBINDEX", "TROPONINI" in the last 168 hours. CBG: Recent Labs  Lab 11/01/22 0844  GLUCAP 157*   Iron Studies:  Recent Labs    10/30/22 1723  IRON 91  TIBC 241*  FERRITIN 1,883*   '@lablastinr3'$ @ Studies/Results: EEG adult  Result Date: 10/31/2022 [name], MD     10/31/2022 10:47 AM Patient Name: XSAVIER SEELEY MRN: [id number] Epilepsy Attending: [name] Referring Physician/Provider: Juanito Doom, MD Date: 10/31/2022 Duration: 22.37 mins Patient history: 66 year old male with altered mental status.  EEG to evaluate for seizure. Level of alertness: Awake, drowsy AEDs during EEG study: None Technical aspects: This EEG study was done with scalp electrodes positioned according to the 10-20 International system of electrode placement. Electrical activity was reviewed with band pass filter of 1-'70Hz'$ , sensitivity of 7 uV/mm, display speed of 10m/sec with a '60Hz'$  notched filter applied as appropriate. EEG data were recorded continuously and digitally stored.  Video monitoring was available and reviewed as appropriate. Description: The posterior dominant rhythm consists of 8 Hz activity of moderate voltage (25-35 uV) seen predominantly in posterior head regions, symmetric  and reactive to eye opening and eye closing. Drowsiness was characterized by attenuation of the posterior background rhythm.  EEG showed no regional 3 to 7 Hz theta- delta slowing. Hyperventilation and photic stimulation were not performed.   ABNORMALITY - Intermittent slow, generalized IMPRESSION: This study is suggestive of mild diffuse encephalopathy, nonspecific etiology. No seizures or epileptiform discharges were seen throughout the recording. Priyanka Barbra Sarks   Medications:  sodium chloride     dexmedetomidine (PRECEDEX) IV infusion Stopped (10/31/22 1238)   dialysis solution 1.5% low-MG/low-CA     norepinephrine (LEVOPHED) Adult infusion 12 mcg/min (11/01/22  0919)   piperacillin-tazobactam (ZOSYN)  IV Stopped (11/01/22 0536)   potassium chloride 10 mEq (11/01/22 1143)    sodium chloride   Intravenous Once   Chlorhexidine Gluconate Cloth  6 each Topical Daily   gentamicin cream  1 Application Topical Daily   hydrocortisone sodium succinate  100 mg Intravenous Q12H   ipratropium-albuterol  3 mL Nebulization Q4H   mometasone-formoterol  2 puff Inhalation BID   mouth rinse  15 mL Mouth Rinse Q4H   oseltamivir  30 mg Oral Daily   polyethylene glycol  17 g Oral Daily   QUEtiapine  50 mg Oral BID    Assessment/Plan **Septic shock:  febrile, marked leukocytosis.  +influenza. S/p ureteral stent for hydro/34m stone.  UA didn't look like UTI.  CXR with R infiltrate.  12/27 PD fluid not consistent with peritonitis. On tamiflu and pip/tazo per primary. Pressor support per CCM   **ESRD on PD: does PD 6x/wk, off Sat - will re-attempt treatment again tonight - using all 1.5% for now; makes good UOP can use diuretics PRN.   Bowel regimen.  Daily PD cath care.     **AHRF:  12/27 extubated and worsening hypoxia.  CXR with R infiltrate.   On tamiflu and pip/tazo   **Anemia:  Hb in 6s, transfusion per primary.  Iron replete 12/27.  Heme notes from 11/28 show eval - started B12 injections.    Would defer ESA dosing to heme who has been consulted. Hgb 7.6 today   **IgG Kappa MM: f/b Dr. KDelton Coombes current tx Daratumumab SQ + Bortezomib + Dexamethasone (DaraVd) q21d / Daratumumab SQ q28d.   appears blasts noted on AM labs and hematology consulted   **AMS: per primary; BUN <<100 doubt uremic -- prob delirium; appears has happened in prior admissions    VGean Quint MD [place]

## 2022-11-01 NOTE — Progress Notes (Signed)
NAME:  Ernest Barnett, MRN:  [id number], DOB:  11-02-1956, LOS: 3 ADMISSION DATE:  10/29/2022, CONSULTATION DATE:  12/26 REFERRING MD:  Doren Custard, CHIEF COMPLAINT:  Confusion   History of Present Illness:  66 y/o male with multiple medical problems presented to [facility] with abdominal pain and fever to 103.  Found to be FLU A positive.  Also noted to have 89mmR ureteral stone with hydroureteronephrosis.  Moved to the ICU for sepsis, intubated and take to the OR for R nephrostomy stent and stone removal.    Pertinent  Medical History  COPD ESRD on PD CVA GERD Hyperlipidemia Hypertension Multiple myeloma  Significant Hospital Events: Including procedures, antibiotic start and stop dates in addition to other pertinent events   12/26 admitted, to OR with urology for cystoscopy and right ureteral stent; GPC 1/4 bottles 12/27 extubated, confused, found to have RLL infiltrate, on BIPAP and HHF O2 and precedex.  Peritoneal fluid analysis showed 12 WBC. Changed from ceftriaxone to zosyn.  12/28 Agitation improved yesterday  Had worsening shock and work of breathing last night peripheral smear: acute plasma cell leukemia Hematology consult: could consider G-CSF and salvage chemo if he survives this illness  Interim History / Subjective:   Agitation improved yesterday Had worsening shock and work of breathing last night Peripheral smear: acute plasma cell leukemia Hematology consult: could consider G-CSF and salvage chemo if he survives this illness Concern for aspiraiton last night, never witnessed  Objective   Blood pressure (!) 164/66, pulse 90, temperature (!) 100.7 F (38.2 C), temperature source Axillary, resp. rate 16, weight 116.9 kg, SpO2 96 %.        Intake/Output Summary (Last 24 hours) at 11/01/2022 0801 Last data filed at 11/01/2022 0750 Gross per 24 hour  Intake 6426.11 ml  Output 3375 ml  Net 3051.11 ml   Filed Weights   10/30/22 0130  Weight: 116.9 kg     Examination:  General:  mild agitation, no increase work of breathing this morning HENT: NCAT OP clear PULM: Crackles bases B, normal effort CV: RRR, no mgr GI: BS+, soft, nontender MSK: normal bulk and tone Neuro: awake, alert, confused, very hard of hearing, [facility]air[facility] HospitalProblem list     Assessment & Plan:  Severe sepsis due to: obstructive pyelonephritis, right lower lobe aspiration pneumonia GPC 1/4 bottles> contaminant Zosyn 5 days Monitor cultures  Acute metabolic encephalopathy with combativeness > related to prior frontal lobe stroke and acute infection (has had this before) Minimize sedatives Frequent orientation Supportive care Scheduled seroquel  Flu A tamilfu  ESRD on PD PD per renal  Monitor BMET and UOP Replace electrolytes as needed  Acute on chronic macrocytic anemia with severe thrombocytopenia Multiple myeloma Acute plasma cell leukemia, poor prognosis Supportive care for now If survives this illness could consider salvage chemotherapy Monitor for bleeding Transfuse PRBC for Hgb < 7 gm/dL  Swallowing assessment: D3 diet Attempt to eat again today, monitor for aspiration  Septic shock Adrenal insufficiency Levophed for MAP > 65 Hydrocortisone 1076mIV 12h   Best Practice (right click and "Reselect all SmartList Selections" daily)   Diet/type: NPO > advance post [facility] DVT prophylaxis: SCD GI prophylaxis: N/A Lines: N/A Foley:  Yes, and it is no longer needed Code Status:  DNR Last date of multidisciplinary goals of care discussion [see multiple notes yesterday]   Comfort if dyspnea/work of breathing worsesn  Critical care time: 32 minutes    BrRoselie AwkwardMD [facility]CCM  Pager: [phone] Cell: [phone] After 7:00 pm call Elink  [phone]

## 2022-11-02 DIAGNOSIS — N200 Calculus of kidney: Secondary | ICD-10-CM | POA: Diagnosis not present

## 2022-11-02 DIAGNOSIS — N138 Other obstructive and reflux uropathy: Secondary | ICD-10-CM | POA: Diagnosis not present

## 2022-11-02 LAB — CBC
HCT: 21.6 % — ABNORMAL LOW (ref 39.0–52.0)
Hemoglobin: 7.1 g/dL — ABNORMAL LOW (ref 13.0–17.0)
MCH: 33.2 pg (ref 26.0–34.0)
MCHC: 32.9 g/dL (ref 30.0–36.0)
MCV: 100.9 fL — ABNORMAL HIGH (ref 80.0–100.0)
Platelets: 30 10*3/uL — ABNORMAL LOW (ref 150–400)
RBC: 2.14 MIL/uL — ABNORMAL LOW (ref 4.22–5.81)
RDW: 25.3 % — ABNORMAL HIGH (ref 11.5–15.5)
WBC: 25.2 10*3/uL — ABNORMAL HIGH (ref 4.0–10.5)
nRBC: 0.9 % — ABNORMAL HIGH (ref 0.0–0.2)

## 2022-11-02 LAB — BASIC METABOLIC PANEL
Anion gap: 15 (ref 5–15)
BUN: 35 mg/dL — ABNORMAL HIGH (ref 8–23)
CO2: 22 mmol/L (ref 22–32)
Calcium: 8.1 mg/dL — ABNORMAL LOW (ref 8.9–10.3)
Chloride: 107 mmol/L (ref 98–111)
Creatinine, Ser: 4.98 mg/dL — ABNORMAL HIGH (ref 0.61–1.24)
GFR, Estimated: 12 mL/min — ABNORMAL LOW (ref >60)
Glucose, Bld: 121 mg/dL — ABNORMAL HIGH (ref 70–99)
Potassium: 2.9 mmol/L — ABNORMAL LOW (ref 3.5–5.1)
Sodium: 144 mmol/L (ref 135–145)

## 2022-11-02 MED ORDER — HALOPERIDOL LACTATE 5 MG/ML IJ SOLN
INTRAMUSCULAR | Status: AC
  Administered 2022-11-02: 5 mg via INTRAVENOUS
  Filled 2022-11-02: qty 1

## 2022-11-02 MED ORDER — ORAL CARE MOUTH RINSE: 15.0000 mL | OROMUCOSAL | Status: DC | PRN

## 2022-11-02 MED ORDER — HALOPERIDOL LACTATE 5 MG/ML IJ SOLN: 5.0000 mg | Freq: Once | INTRAMUSCULAR | Status: AC

## 2022-11-02 MED ORDER — POTASSIUM CHLORIDE CRYS ER 20 MEQ PO TBCR: 40.0000 meq | EXTENDED_RELEASE_TABLET | Freq: Once | ORAL | Status: DC

## 2022-11-02 MED ORDER — OSELTAMIVIR PHOSPHATE 30 MG PO CAPS
30.0000 mg | ORAL_CAPSULE | Freq: Every day | ORAL | Status: AC
  Administered 2022-11-03: 30 mg via ORAL
  Filled 2022-11-02: qty 1

## 2022-11-02 MED ORDER — POTASSIUM CHLORIDE CRYS ER 20 MEQ PO TBCR
40.0000 meq | EXTENDED_RELEASE_TABLET | Freq: Once | ORAL | Status: AC
  Administered 2022-11-02: 40 meq via ORAL
  Filled 2022-11-02: qty 2

## 2022-11-02 MED ORDER — POTASSIUM CHLORIDE CRYS ER 20 MEQ PO TBCR: 40.0000 meq | EXTENDED_RELEASE_TABLET | Freq: Two times a day (BID) | ORAL | Status: DC

## 2022-11-02 MED ORDER — POTASSIUM CHLORIDE 10 MEQ/100ML IV SOLN
10.0000 meq | INTRAVENOUS | Status: AC
  Administered 2022-11-02 (×4): 10 meq via INTRAVENOUS
  Filled 2022-11-02 (×4): qty 100

## 2022-11-02 NOTE — Progress Notes (Signed)
   NAME:  Ernest Barnett, MRN:  [id number], DOB:  1956/09/07, LOS: 4 ADMISSION DATE:  10/29/2022, CONSULTATION DATE:  12/26 REFERRING MD:  Doren Custard, CHIEF COMPLAINT:  Confusion   History of Present Illness:  66 y/o male with multiple medical problems presented to [facility] with abdominal pain and fever to 103.  Found to be FLU A positive.  Also noted to have 42mmR ureteral stone with hydroureteronephrosis.  Moved to the ICU for sepsis, intubated and take to the OR for R nephrostomy stent and stone removal.    Pertinent  Medical History  COPD ESRD on PD CVA GERD Hyperlipidemia Hypertension Multiple myeloma  Significant Hospital Events: Including procedures, antibiotic start and stop dates in addition to other pertinent events   12/26 admitted, to OR with urology for cystoscopy and right ureteral stent; GPC 1/4 bottles 12/27 extubated, confused, found to have RLL infiltrate, on BIPAP and HHF O2 and precedex.  Peritoneal fluid analysis showed 12 WBC. Changed from ceftriaxone to zosyn.  12/28 Agitation improved yesterday  Had worsening shock and work of breathing last night peripheral smear: acute plasma cell leukemia Hematology consult: could consider G-CSF and salvage chemo if he survives this illness  Interim History / Subjective:   Off oxygen WBC UP PD again overnight  Objective   Blood pressure 107/62, pulse 100, temperature 98.5 F (36.9 C), temperature source Oral, resp. rate 19, weight 116.9 kg, SpO2 91 %.        Intake/Output Summary (Last 24 hours) at 11/02/2022 0743 Last data filed at 11/02/2022 0600 Gross per 24 hour  Intake 1421.98 ml  Output 2665 ml  Net -1243.02 ml   Filed Weights   10/30/22 0130  Weight: 116.9 kg    Examination:  General:  Resting comfortably in bed HENT: NCAT OP clear PULM: CTA B, normal effort CV: RRR, no mgr GI: BS+, soft, nontender MSK: normal bulk and tone Neuro: awake, alert, no distress, MAEW    Resolved Hospital Problem list    Acute respiratory failure with hypoxemia  Assessment & Plan:  Severe sepsis due to: obstructive pyelonephritis, right lower lobe aspiration pneumonia GPC 1/4 bottles> contaminant Zosyn 5 days end today Monitor cultures  Acute metabolic encephalopathy with combativeness > related to prior frontal lobe stroke and acute infection (has had this before) Minimize sedatives Frequent orientation Supportive care seroquel  Flu A Tamilfu end today  ESRD on PD Hypokalemia Ureteral stone s/p R uretal stent PD per renal Monitor BMET and UOP Replace electrolytes as needed  Acute on chronic macrocytic anemia with severe thrombocytopenia Multiple myeloma Acute plasma cell leukemia, poor prognosis Neutropenia GCSF today Monitor for bleeding Transfuse PRBC for Hgb < 7 gm/dL If survives could be considered for salvage chemotherapy> will ask H/O if they would consider as inpatient  Swallowing assessment: D3 diet  Septic shock >resolved Adrenal insufficiency Monitor off leveophed Hydrocortisone 1062mIV q12h  Best Practice (right click and "Reselect all SmartList Selections" daily)   Diet/type: NPO > advance post [facility] DVT prophylaxis: SCD GI prophylaxis: N/A Lines: N/A Foley:  Yes, and it is no longer needed Code Status:  DNR Last date of multidisciplinary goals of care discussion [see multiple notes yesterday]   Comfort if dyspnea/work of breathing worsens  Critical care time: n/a    BrRoselie AwkwardMD [facility]denCCM Pager: [phone]: [phone] 7:00 pm call Elink  [phone]

## 2022-11-02 NOTE — Progress Notes (Signed)
Pt becoming increasingly agitated. Trying to climb out of bed, pulling at equipment, screaming obscenities at staff and attempting to hit and kick staff. Does not respond to redirection. Dr. Lake Bells notified and order received for '5mg'$  Haldol IV and belt restraint to prevent pt from climbing out of bed. Pt tolerated well and at present, is more calm.

## 2022-11-02 NOTE — Progress Notes (Signed)
[facility] Progress Note   Subjective:   remains confused, no issues with PD overnight. Discussed with daughter and wife in hallway. Off levo  Objective Vitals:   11/02/22 1000 11/02/22 1100 11/02/22 1108 11/02/22 1200  BP: 120/84  119/75 106/74  Pulse: (!) 103 (!) 201 99 (!) 106  Resp: (!) 22 (!) '21 17 17  '$ Temp:      TempSrc:      SpO2: (!) 88% 90% (!) 89% 92%  Weight:       Physical Exam General: obese man, confused/agitated Heart: RRR Lungs: dec BS bases, rhonchi ant Abdomen:soft, obese, nontender on palpation, PD cath c/d/i Extremities: trace edema Neuro: confused   Additional Objective Labs: Basic Metabolic Panel: Recent Labs  Lab 10/30/22 0500 10/31/22 0534 11/01/22 0438 11/02/22 0530  NA 140 143 143 144  K 3.6 3.5 2.9* 2.9*  CL 107 109 104 107  CO2 21* '23 22 22  '$ GLUCOSE 102* 146* 158* 121*  BUN 42* 47* 45* 35*  CREATININE 7.02* 6.89* 6.40* 4.98*  CALCIUM 8.1* 8.2* 8.5* 8.1*  PHOS 5.2* 5.3*  --   --    Liver Function Tests: Recent Labs  Lab 10/29/22 1057  AST 22  ALT 21  ALKPHOS 28*  BILITOT 0.6  PROT 6.6  ALBUMIN 3.2*   No results for input(s): "LIPASE", "AMYLASE" in the last 168 hours. CBC: Recent Labs  Lab 10/29/22 1025 10/29/22 2239 10/30/22 0500 10/30/22 1723 10/31/22 0534 11/01/22 0438 11/02/22 0530  WBC 35.5*  --  20.9*  --  13.3* 16.1* 25.2*  NEUTROABS 0.4*  --   --   --   --  0.2*  --   HGB 6.0*   < > 6.1*  --  7.0* 7.6* 7.1*  HCT 18.4*   < > 18.3*  --  21.0* 22.4* 21.6*  MCV 110.8*  --  107.0*  --  104.0* 100.4* 100.9*  PLT 27*  --  20*   < > 39* 33* 30*   < > = values in this interval not displayed.   Blood Culture    Component Value Date/Time   SDES PERITONEAL 10/30/2022 1028   SPECREQUEST PERITONEAL DIALYSATE 10/30/2022 1028   CULT  10/30/2022 1028    NO GROWTH 3 DAYS Performed at [facility], [place] [address], [place], [place] [address]    REPTSTATUS PENDING 10/30/2022 1028    Cardiac  Enzymes: No results for input(s): "CKTOTAL", "CKMB", "CKMBINDEX", "TROPONINI" in the last 168 hours. CBG: Recent Labs  Lab 11/01/22 0844  GLUCAP 157*   Iron Studies:  Recent Labs    10/30/22 1723  IRON 91  TIBC 241*  FERRITIN 1,883*   '@lablastinr3'$ @ Studies/Results: No results found. Medications:  sodium chloride     dexmedetomidine (PRECEDEX) IV infusion Stopped (10/31/22 1238)   dialysis solution 1.5% low-MG/low-CA     norepinephrine (LEVOPHED) Adult infusion Stopped (11/02/22 0729)   piperacillin-tazobactam (ZOSYN)  IV Stopped (11/02/22 0559)   potassium chloride 10 mEq (11/02/22 1243)    sodium chloride   Intravenous Once   Chlorhexidine Gluconate Cloth  6 each Topical Daily   gentamicin cream  1 Application Topical Daily   hydrocortisone sodium succinate  100 mg Intravenous Q12H   ipratropium-albuterol  3 mL Nebulization TID   mometasone-formoterol  2 puff Inhalation BID   mouth rinse  15 mL Mouth Rinse Q4H   polyethylene glycol  17 g Oral Daily   QUEtiapine  50 mg Oral BID   Tbo-filgastrim (  GRANIX) SQ  480 mcg Subcutaneous Daily    Assessment/Plan **Septic shock:  febrile, marked leukocytosis.  +influenza. S/p ureteral stent for hydro/28m stone.  UA didn't look like UTI.  CXR with R infiltrate.  12/27 PD fluid not consistent with peritonitis. On tamiflu and pip/tazo per primary. Off pressors   **ESRD on PD: does PD 6x/wk, off Sat, home unit: [facility] (followed by Dr. LEduard Clos - using all 1.5% for now; makes good UOP can use diuretics PRN.   Bowel regimen.  Daily PD cath care.  PD tonight   **AHRF:  12/27 extubated and worsening hypoxia.  CXR with R infiltrate.   On tamiflu and pip/tazo   **Anemia:  Hb in 6s, transfusion per primary.  Iron replete 12/27.  Heme notes from 11/28 show eval - started B12 injections.    Would defer ESA dosing to heme who has been consulted. Hgb 7.1 today   **IgG Kappa MM: f/b Dr. KDelton Coombes current tx Daratumumab SQ + Bortezomib +  Dexamethasone (DaraVd) q21d / Daratumumab SQ q28d.   appears blasts noted on AM labs and hematology consulted   **AMS: per primary; BUN <<100 doubt uremic -- prob delirium; appears has happened in prior admissions  **Hypokalemia -replete PRN (cautiously in the context of ESRD)    VGean Quint MD [place]

## 2022-11-02 NOTE — Progress Notes (Signed)
Received call from [facility]. Pt had called 911 from his personal cell phone and was calling to make sure that pt was OK. Pt remains disoriented to place and situation. Cell phone placed out of reach from pt in room. Pt continues to threaten staff.

## 2022-11-03 DIAGNOSIS — N186 End stage renal disease: Secondary | ICD-10-CM | POA: Diagnosis not present

## 2022-11-03 DIAGNOSIS — N138 Other obstructive and reflux uropathy: Secondary | ICD-10-CM | POA: Diagnosis not present

## 2022-11-03 DIAGNOSIS — N200 Calculus of kidney: Secondary | ICD-10-CM | POA: Diagnosis not present

## 2022-11-03 DIAGNOSIS — Z992 Dependence on renal dialysis: Secondary | ICD-10-CM | POA: Diagnosis not present

## 2022-11-03 LAB — CBC WITH DIFFERENTIAL/PLATELET
Abs Immature Granulocytes: 0 10*3/uL (ref 0.00–0.07)
Basophils Absolute: 0 10*3/uL (ref 0.0–0.1)
Basophils Relative: 0 %
Eosinophils Absolute: 0 10*3/uL (ref 0.0–0.5)
Eosinophils Relative: 0 %
HCT: 23.8 % — ABNORMAL LOW (ref 39.0–52.0)
Hemoglobin: 7.9 g/dL — ABNORMAL LOW (ref 13.0–17.0)
Lymphocytes Relative: 19 %
Lymphs Abs: 7.7 10*3/uL — ABNORMAL HIGH (ref 0.7–4.0)
MCH: 33.9 pg (ref 26.0–34.0)
MCHC: 33.2 g/dL (ref 30.0–36.0)
MCV: 102.1 fL — ABNORMAL HIGH (ref 80.0–100.0)
Monocytes Absolute: 1.6 10*3/uL — ABNORMAL HIGH (ref 0.1–1.0)
Monocytes Relative: 4 %
Neutro Abs: 3.2 10*3/uL (ref 1.7–7.7)
Neutrophils Relative %: 8 %
Other: 69 %
Platelets: 24 10*3/uL — CL (ref 150–400)
RBC: 2.33 MIL/uL — ABNORMAL LOW (ref 4.22–5.81)
RDW: 25.2 % — ABNORMAL HIGH (ref 11.5–15.5)
WBC: 40.3 10*3/uL — ABNORMAL HIGH (ref 4.0–10.5)
nRBC: 0.5 % — ABNORMAL HIGH (ref 0.0–0.2)

## 2022-11-03 LAB — CBC
HCT: 24.4 % — ABNORMAL LOW (ref 39.0–52.0)
Hemoglobin: 7.9 g/dL — ABNORMAL LOW (ref 13.0–17.0)
MCH: 33.3 pg (ref 26.0–34.0)
MCHC: 32.4 g/dL (ref 30.0–36.0)
MCV: 103 fL — ABNORMAL HIGH (ref 80.0–100.0)
Platelets: 26 10*3/uL — CL (ref 150–400)
RBC: 2.37 MIL/uL — ABNORMAL LOW (ref 4.22–5.81)
RDW: 25.2 % — ABNORMAL HIGH (ref 11.5–15.5)
WBC: 42.7 10*3/uL — ABNORMAL HIGH (ref 4.0–10.5)
nRBC: 0.5 % — ABNORMAL HIGH (ref 0.0–0.2)

## 2022-11-03 LAB — BODY FLUID CULTURE W GRAM STAIN
Culture: NO GROWTH
Gram Stain: NONE SEEN

## 2022-11-03 LAB — COMPREHENSIVE METABOLIC PANEL
ALT: 44 U/L (ref 0–44)
AST: 48 U/L — ABNORMAL HIGH (ref 15–41)
Albumin: 2.3 g/dL — ABNORMAL LOW (ref 3.5–5.0)
Alkaline Phosphatase: 28 U/L — ABNORMAL LOW (ref 38–126)
Anion gap: 8 (ref 5–15)
BUN: 39 mg/dL — ABNORMAL HIGH (ref 8–23)
CO2: 22 mmol/L (ref 22–32)
Calcium: 8.4 mg/dL — ABNORMAL LOW (ref 8.9–10.3)
Chloride: 109 mmol/L (ref 98–111)
Creatinine, Ser: 5.07 mg/dL — ABNORMAL HIGH (ref 0.61–1.24)
GFR, Estimated: 12 mL/min — ABNORMAL LOW (ref >60)
Glucose, Bld: 132 mg/dL — ABNORMAL HIGH (ref 70–99)
Potassium: 3.5 mmol/L (ref 3.5–5.1)
Sodium: 139 mmol/L (ref 135–145)
Total Bilirubin: 0.5 mg/dL (ref 0.3–1.2)
Total Protein: 5.9 g/dL — ABNORMAL LOW (ref 6.5–8.1)

## 2022-11-03 LAB — BASIC METABOLIC PANEL
Anion gap: 9 (ref 5–15)
BUN: 37 mg/dL — ABNORMAL HIGH (ref 8–23)
CO2: 21 mmol/L — ABNORMAL LOW (ref 22–32)
Calcium: 8.3 mg/dL — ABNORMAL LOW (ref 8.9–10.3)
Chloride: 110 mmol/L (ref 98–111)
Creatinine, Ser: 5.02 mg/dL — ABNORMAL HIGH (ref 0.61–1.24)
GFR, Estimated: 12 mL/min — ABNORMAL LOW (ref >60)
Glucose, Bld: 121 mg/dL — ABNORMAL HIGH (ref 70–99)
Potassium: 3.3 mmol/L — ABNORMAL LOW (ref 3.5–5.1)
Sodium: 140 mmol/L (ref 135–145)

## 2022-11-03 LAB — CULTURE, BLOOD (ROUTINE X 2)
Culture  Setup Time: NONE SEEN
Culture: NO GROWTH
Culture: NO GROWTH

## 2022-11-03 LAB — PATHOLOGIST SMEAR REVIEW

## 2022-11-03 MED ORDER — DELFLEX-LC/1.5% DEXTROSE 344 MOSM/L IP SOLN: INTRAPERITONEAL | Status: DC

## 2022-11-03 NOTE — Procedures (Signed)
PD note:  Patient treatment terminated at 0200 because the machine stated that there was a blockage on the patient's side. There was a small amount of fibrin in the effluent bag.  The patient did not report any pain related to the treatment.  He responded with awareness of self, although may not remember the events in the middle of the night.  Dr. Candiss Norse will be notified.  Artist Pais, RN

## 2022-11-03 NOTE — Progress Notes (Signed)
   NAME:  Ernest Barnett, MRN:  [id number], DOB:  [birth year], LOS: 5 ADMISSION DATE:  10/29/2022, CONSULTATION DATE:  12/26 REFERRING MD:  Doren Custard, CHIEF COMPLAINT:  Confusion   History of Present Illness:  66 y/o male with multiple medical problems presented to [facility] with abdominal pain and fever to 103.  Found to be FLU A positive.  Also noted to have 65mmR ureteral stone with hydroureteronephrosis.  Moved to the ICU for sepsis, intubated and take to the OR for R nephrostomy stent and stone removal.    Pertinent  Medical History  COPD ESRD on PD CVA GERD Hyperlipidemia Hypertension Multiple myeloma  Significant Hospital Events: Including procedures, antibiotic start and stop dates in addition to other pertinent events   12/26 admitted, to OR with urology for cystoscopy and right ureteral stent; GPC 1/4 bottles 12/27 extubated, confused, found to have RLL infiltrate, on BIPAP and HHF O2 and precedex.  Peritoneal fluid analysis showed 12 WBC. Changed from ceftriaxone to zosyn.  12/28 Agitation improved yesterday  Had worsening shock and work of breathing last night peripheral smear: acute plasma cell leukemia Hematology consult: could consider G-CSF and salvage chemo if he survives this illness  Interim History / Subjective:   Agitated yesterday, threatening staff, started on precedex More calm this morning Afebrile WBC uop Remains neutropenic  Objective   Blood pressure 120/79, pulse 78, temperature 98.2 F (36.8 C), temperature source Axillary, resp. rate 18, weight 116.9 kg, SpO2 95 %.        Intake/Output Summary (Last 24 hours) at 11/03/2022 07035Last data filed at 11/03/2022 0900 Gross per 24 hour  Intake 372.85 ml  Output 2775 ml  Net -2402.15 ml   Filed Weights   10/30/22 0130  Weight: 116.9 kg    Examination:  General:  Resting comfortably in bed HENT: NCAT OP clear PULM: CTA B, normal effort CV: RRR, no mgr GI: BS+, soft, nontender MSK: normal bulk and  tone Neuro: awake, alert, no distress, MRiley HospitalProblem list   Acute respiratory failure with hypoxemia Flu A  Assessment & Plan:  Severe sepsis due to: obstructive pyelonephritis, right lower lobe aspiration pneumonia GPC 1/4 bottles> contaminant Zosyn end tomorrow Monitor cultures  Acute metabolic encephalopathy with combativeness > related to prior frontal lobe stroke and acute infection (has had this before) ICU delirium> severe intermittent agitation, improved 12/31 Seroquel Frequent orientation Precedex wean off Out of bed if able PT/OT  ESRD on PD Hypokalemia Ureteral stone s/p R uretal stent PD per renal Monitor BMET and UOP Replace electrolytes as needed   Acute on chronic macrocytic anemia with severe thrombocytopenia Multiple myeloma Acute plasma cell leukemia, poor prognosis Neutropenia GMCSF per heme/neuro Monitor for bleeding Transfuse PRBC for Hgb < 7 gm/dL Consider inpatient leukemia treatment depending on overall improvement  Swallowing assessment: D3 diet  Septic shock >resolved Monitor off levophed Stop hydrocortisone (contributing to confusion?)  Best Practice (right click and "Reselect all SmartList Selections" daily)   Diet/type: NPO > advance post [facility] DVT prophylaxis: SCD GI prophylaxis: N/A Lines: N/A Foley:  Yes, and it is no longer needed Code Status:  DNR Last date of multidisciplinary goals of care discussion [see multiple notes yesterday]   Comfort if dyspnea/work of breathing worsens  Critical care time: n/a    BRoselie Awkward MD L[facility] Point-[facility] PointPCCM Pager: [phone]: [phone] 7:00 pm call Elink  [phone]

## 2022-11-03 NOTE — Progress Notes (Signed)
[facility] Progress Note   Subjective:   alarms with PD again last night. Per chart "insufficient drainage, no occlusion noted in transfer set therefore treatment ended. On precedex now given agitation.  Objective Vitals:   11/03/22 0900 11/03/22 1000 11/03/22 1100 11/03/22 1200  BP: 120/79 117/71 123/78 132/78  Pulse: 78 (!) 55 63 62  Resp: '18 16 16 18  '$ Temp:      TempSrc:      SpO2: 95% 95% 92% 95%  Weight:       Physical Exam General: obese man, calm today Heart: RRR Lungs: dec BS bases, rhonchi ant Abdomen:soft, obese, nontender on palpation, RLQ PD cath c/d/i Extremities: trace edema Neuro: awake, alert, calm   Additional Objective Labs: Basic Metabolic Panel: Recent Labs  Lab 10/30/22 0500 10/31/22 0534 11/01/22 0438 11/02/22 0530 11/03/22 0531 11/03/22 0949  NA 140 143   < > 144 140 139  K 3.6 3.5   < > 2.9* 3.3* 3.5  CL 107 109   < > 107 110 109  CO2 21* 23   < > 22 21* 22  GLUCOSE 102* 146*   < > 121* 121* 132*  BUN 42* 47*   < > 35* 37* 39*  CREATININE 7.02* 6.89*   < > 4.98* 5.02* 5.07*  CALCIUM 8.1* 8.2*   < > 8.1* 8.3* 8.4*  PHOS 5.2* 5.3*  --   --   --   --    < > = values in this interval not displayed.   Liver Function Tests: Recent Labs  Lab 10/29/22 1057 11/03/22 0949  AST 22 48*  ALT 21 44  ALKPHOS 28* 28*  BILITOT 0.6 0.5  PROT 6.6 5.9*  ALBUMIN 3.2* 2.3*   No results for input(s): "LIPASE", "AMYLASE" in the last 168 hours. CBC: Recent Labs  Lab 10/29/22 1025 10/29/22 2239 10/30/22 0500 10/30/22 1723 10/31/22 0534 11/01/22 0438 11/02/22 0530 11/03/22 0531  WBC 35.5*  --  20.9*  --  13.3* 16.1* 25.2* 42.7*  NEUTROABS 0.4*  --   --   --   --  0.2*  --   --   HGB 6.0*   < > 6.1*  --  7.0* 7.6* 7.1* 7.9*  HCT 18.4*   < > 18.3*  --  21.0* 22.4* 21.6* 24.4*  MCV 110.8*  --  107.0*  --  104.0* 100.4* 100.9* 103.0*  PLT 27*  --  20*   < > 39* 33* 30* 26*   < > = values in this interval not displayed.   Blood  Culture    Component Value Date/Time   SDES PERITONEAL 10/30/2022 1028   SPECREQUEST PERITONEAL DIALYSATE 10/30/2022 1028   CULT  10/30/2022 1028    NO GROWTH 3 DAYS Performed at [facility], [place] [address] [place], [place] [address]    REPTSTATUS 11/03/2022 FINAL 10/30/2022 1028    Cardiac Enzymes: No results for input(s): "CKTOTAL", "CKMB", "CKMBINDEX", "TROPONINI" in the last 168 hours. CBG: Recent Labs  Lab 11/01/22 0844  GLUCAP 157*   Iron Studies:  No results for input(s): "IRON", "TIBC", "TRANSFERRIN", "FERRITIN" in the last 72 hours.  '@lablastinr3'$ @ Studies/Results: No results found. Medications:  sodium chloride     dexmedetomidine (PRECEDEX) IV infusion 0.4 mcg/kg/hr (11/03/22 1200)   dialysis solution 1.5% low-MG/low-CA     norepinephrine (LEVOPHED) Adult infusion Stopped (11/02/22 0729)   piperacillin-tazobactam (ZOSYN)  IV Stopped (11/03/22 0555)    sodium chloride   Intravenous Once  Chlorhexidine Gluconate Cloth  6 each Topical Daily   gentamicin cream  1 Application Topical Daily   ipratropium-albuterol  3 mL Nebulization TID   mometasone-formoterol  2 puff Inhalation BID   polyethylene glycol  17 g Oral Daily   QUEtiapine  50 mg Oral BID   Tbo-filgastrim (GRANIX) SQ  480 mcg Subcutaneous Daily    Assessment/Plan **Septic shock:  febrile, marked leukocytosis.  +influenza. S/p ureteral stent for hydro/74m stone.  UA didn't look like UTI.  CXR with R infiltrate.  12/27 PD fluid not consistent with peritonitis. On tamiflu and pip/tazo per primary. Off pressors   **ESRD on PD: does PD 6x/wk, off Sat, home unit: [facility] (followed by Dr. LEduard Clos - using all 1.5% for now; makes good UOP can use diuretics PRN.   Bowel regimen.  Daily PD cath care.  Will re-attempt PD tonight   **AHRF:  12/27 extubated and worsening hypoxia.  CXR with R infiltrate.   On tamiflu and pip/tazo   **Anemia:  transfuse prn.  Iron replete 12/27.  Heme notes from 11/28  show eval - started B12 injections.    Would defer ESA dosing to heme who has been consulted. Hgb 7.9 today   **IgG Kappa MM, acute plasma cell leukemia, neutropenia: f/b Dr. KDelton Coombes current tx Daratumumab SQ + Bortezomib + Dexamethasone (DaraVd) q21d / Daratumumab SQ q28d. hematology consulted. On GMCSF. Poor prognosis   **AMS: per primary; BUN <<100 doubt uremic -- prob delirium; appears has happened in prior admissions  **Hypokalemia -replete PRN (cautiously in the context of ESRD)    VGean Quint MD [place]oche[place]

## 2022-11-03 NOTE — Progress Notes (Signed)
Peritoneal Dialysis machine alarming for insufficient drainage. RN tried to restart treatment, but machine continued to have same error. This RN contacted [employer] support since Dialysis RNs are not available e over night to trouble shoot machine. After a 40 minute call with the [employer] tech support representative machine began to alarm for occlusion in patient line. RN was instructed to inspect the transfer set. No occlusion noted in the transfer set. [employer] tech support advised RN to end treatment. Treatment ended and patient remains connected to the machine.

## 2022-11-03 NOTE — Procedures (Signed)
PD Note  Patient placed on treatment at [facility] with no issues.  Dressing change done successfully using the gentamicin cream.  Site without any issues.

## 2022-11-04 DIAGNOSIS — D61818 Other pancytopenia: Secondary | ICD-10-CM | POA: Diagnosis not present

## 2022-11-04 DIAGNOSIS — C9010 Plasma cell leukemia not having achieved remission: Secondary | ICD-10-CM | POA: Diagnosis not present

## 2022-11-04 LAB — BASIC METABOLIC PANEL
Anion gap: 16 — ABNORMAL HIGH (ref 5–15)
BUN: 48 mg/dL — ABNORMAL HIGH (ref 8–23)
CO2: 20 mmol/L — ABNORMAL LOW (ref 22–32)
Calcium: 8.4 mg/dL — ABNORMAL LOW (ref 8.9–10.3)
Chloride: 103 mmol/L (ref 98–111)
Creatinine, Ser: 5.31 mg/dL — ABNORMAL HIGH (ref 0.61–1.24)
GFR, Estimated: 11 mL/min — ABNORMAL LOW (ref >60)
Glucose, Bld: 102 mg/dL — ABNORMAL HIGH (ref 70–99)
Potassium: 2.3 mmol/L — CL (ref 3.5–5.1)
Sodium: 139 mmol/L (ref 135–145)

## 2022-11-04 LAB — CBC WITH DIFFERENTIAL/PLATELET
Abs Immature Granulocytes: 2.3 10*3/uL — ABNORMAL HIGH (ref 0.00–0.07)
Band Neutrophils: 1 %
Basophils Absolute: 3.4 10*3/uL — ABNORMAL HIGH (ref 0.0–0.1)
Basophils Relative: 3 %
Eosinophils Absolute: 0 10*3/uL (ref 0.0–0.5)
Eosinophils Relative: 0 %
HCT: 21.4 % — ABNORMAL LOW (ref 39.0–52.0)
Hemoglobin: 7.2 g/dL — ABNORMAL LOW (ref 13.0–17.0)
Lymphocytes Relative: 12 %
Lymphs Abs: 13.6 10*3/uL — ABNORMAL HIGH (ref 0.7–4.0)
MCH: 34.1 pg — ABNORMAL HIGH (ref 26.0–34.0)
MCHC: 33.6 g/dL (ref 30.0–36.0)
MCV: 101.4 fL — ABNORMAL HIGH (ref 80.0–100.0)
Monocytes Absolute: 2.3 10*3/uL — ABNORMAL HIGH (ref 0.1–1.0)
Monocytes Relative: 2 %
Neutro Abs: 1.1 10*3/uL — ABNORMAL LOW (ref 1.7–7.7)
Neutrophils Relative %: 0 %
Other: 80 %
Platelets: 39 10*3/uL — ABNORMAL LOW (ref 150–400)
Promyelocytes Relative: 2 %
RBC: 2.11 MIL/uL — ABNORMAL LOW (ref 4.22–5.81)
RDW: 24.9 % — ABNORMAL HIGH (ref 11.5–15.5)
WBC: 113 10*3/uL (ref 4.0–10.5)
nRBC: 0.1 % (ref 0.0–0.2)

## 2022-11-04 MED ORDER — LOPERAMIDE HCL 2 MG PO CAPS: 2.0000 mg | ORAL_CAPSULE | ORAL | Status: DC | PRN

## 2022-11-04 MED ORDER — POTASSIUM CHLORIDE 10 MEQ/50ML IV SOLN
10.0000 meq | INTRAVENOUS | Status: AC
  Administered 2022-11-04 (×4): 10 meq via INTRAVENOUS
  Filled 2022-11-04: qty 50

## 2022-11-04 MED ORDER — POTASSIUM CHLORIDE CRYS ER 20 MEQ PO TBCR
40.0000 meq | EXTENDED_RELEASE_TABLET | ORAL | Status: AC
  Administered 2022-11-04 (×2): 40 meq via ORAL
  Filled 2022-11-04 (×2): qty 2

## 2022-11-04 MED ORDER — ALBUMIN HUMAN 25 % IV SOLN
12.5000 g | Freq: Once | INTRAVENOUS | Status: AC
  Administered 2022-11-04: 12.5 g via INTRAVENOUS
  Filled 2022-11-04: qty 50

## 2022-11-04 NOTE — Progress Notes (Signed)
eLink Physician-Brief Progress Note Patient Name: NICKOLUS WADDING DOB: 07-16-1956 MRN: [id number]   Date of Service  11/04/2022  HPI/Events of Note  Mild hypotension with MAP of 61. No distress on camera. HR 110. O2 91 on room air. Has levophed ordered which has been on intermittently (not currently on)  eICU Interventions  Albumin x 1 Hold infusion if develops any hypoxia or fluid overload Start levophed if remains hypotensive      Intervention Category Major Interventions: Hypotension - evaluation and management  Cayley Pester G Hali Balgobin 11/04/2022, 4:21 AM

## 2022-11-04 NOTE — Progress Notes (Signed)
NAME:  Ernest Barnett, MRN:  [id number], DOB:  13-Jul-1956, LOS: 6 ADMISSION DATE:  10/29/2022, CONSULTATION DATE:  12/26 REFERRING MD:  Doren Custard, CHIEF COMPLAINT:  Confusion   History of Present Illness:  67 y/o male with multiple medical problems presented to [facility] with abdominal pain and fever to 103.  Found to be FLU A positive.  Also noted to have 43mmR ureteral stone with hydroureteronephrosis.  Moved to the ICU for sepsis, intubated and take to the OR for R nephrostomy stent and stone removal.    Pertinent  Medical History  COPD ESRD on PD CVA GERD Hyperlipidemia Hypertension Multiple myeloma  Significant Hospital Events: Including procedures, antibiotic start and stop dates in addition to other pertinent events   12/26 admitted, to OR with urology for cystoscopy and right ureteral stent; GPC 1/4 bottles 12/27 extubated, confused, found to have RLL infiltrate, on BIPAP and HHF O2 and precedex.  Peritoneal fluid analysis showed 12 WBC. Changed from ceftriaxone to zosyn.  12/28 Agitation improved yesterday  Had worsening shock and work of breathing last night peripheral smear: acute plasma cell leukemia Hematology consult: could consider G-CSF and salvage chemo if he survives this illness  Interim History / Subjective:   Worsening WBC Peripheral blood flow cytometry ordered by neurology Hypotensive overnight, given albumin  Objective   Blood pressure 99/67, pulse (!) 112, temperature 99.2 F (37.3 C), temperature source Oral, resp. rate (!) 22, weight 116.9 kg, SpO2 (!) 87 %.        Intake/Output Summary (Last 24 hours) at 11/04/2022 0803 Last data filed at 11/04/2022 0700 Gross per 24 hour  Intake 271.77 ml  Output 800 ml  Net -528.23 ml   Filed Weights   10/30/22 0130  Weight: 116.9 kg    Examination:  General:  Resting comfortably in bed HENT: NCAT OP clear PULM: CTA B, normal effort CV: RRR, no mgr GI: BS+, soft, nontender MSK: normal bulk and tone Neuro: awake,  alert, no distress, MAEW    Resolved Hospital Problem list   Acute respiratory failure with hypoxemia Flu A Severe sepsis due to: right lower lobe aspiration pneumonia, possibly obstructive pyelo though CT did not show stranding around kidney Septic shock >resolved  Assessment & Plan:  Severe sepsis due to: right lower lobe aspiration pneumonia, possibly obstructive pyelo though CT did not show stranding around kidney GPC 1/4 bottles> contaminant Zosyn stops today Monitor cultures  Acute metabolic encephalopathy with combativeness > related to prior frontal lobe stroke and acute infection (has had this before) ICU delirium> severe intermittent agitation, improved 12/31 Seroquel Frequent orientation Precedex wean off Out of bed if able PT/OT  ESRD on PD Hypokalemia Ureteral stone s/p R uretal stent PD per renal  Monitor BMET and UOP Replace electrolytes as needed  Acute on chronic macrocytic anemia with severe thrombocytopenia Multiple myeloma Acute plasma cell leukemia, poor prognosis Neutropenia Monitor neutrophil count Monitor for bleeding Transfuse PRBC for Hgb < 7 gm/dL F/u with hematology on 1/2; flow cytometry pending ? Inpatient treatment for leukemia Continue supportive care  Swallowing assessment: D3 diet  Prognosis poor.  Have updated family by phone.   Best Practice (right click and "Reselect all SmartList Selections" daily)   Diet/type: NPO > advance post [facility] DVT prophylaxis: SCD GI prophylaxis: N/A Lines: N/A Foley:  Yes, and it is no longer needed Code Status:  DNR Last date of multidisciplinary goals of care discussion [12/31 lengthy conversation with daughter by phone, remains DNR, hopeful for possible  treatment of leukemia but they understand he may not survive.]   Comfort if dyspnea/work of breathing worsens  Critical care time: n/a    Roselie Awkward, MD [place] [facility] Pager: [phone] Cell: [phone] After 7:00 pm call Elink   [phone]

## 2022-11-04 NOTE — Evaluation (Signed)
Occupational Therapy Evaluation Patient Details Name: Ernest Barnett MRN: [id number] DOB: November 19, 1955 Today's Date: 11/04/2022   History of Present Illness 67 year old male admitted with AMS, transferred from [facility] on 12/26. Pt found to have ureteral stone with hydroureteronephorosis. Pt underwent R nephrostomy stent and stone removal on 12/26. PMH: morbid obesity, obesity hypoventilation syndrome, noncompliant with CPAP, COPD, end-stage renal disease on peritoneal dialysis, hypertension, hyperlipidemia, and multiple myeloma   Clinical Impression   PT admitted with AMS FLU + and hyroreteronphnorsis. Pt currently with functional limitiations due to the deficits listed below (see OT problem list). Per son, he feels that long time partner [name] can manage patient at current level at home. Pt progressed from bed to chair with two person (A). Pt needs to sit very quickly so could benefit from w/c use to help with transfers in the home.  Pt will benefit from skilled OT to increase their independence and safety with adls and balance to allow discharge [place]. If [name] expressed inability to manage at current level will require placement.        Recommendations for follow up therapy are one component of a multi-disciplinary discharge planning process, led by the attending physician.  Recommendations may be updated based on patient status, additional functional criteria and insurance authorization.   Follow Up Recommendations  Home health OT     Assistance Recommended at Discharge Intermittent Supervision/Assistance  Patient can return home with the following Two people to help with walking and/or transfers;Two people to help with bathing/dressing/bathroom    Functional Status Assessment  Patient has had a recent decline in their functional status and demonstrates the ability to make significant improvements in function in a reasonable and predictable amount of time.  Equipment Recommendations  Wheelchair  (measurements OT);Wheelchair cushion (measurements OT);BSC/3in1    Recommendations for Other Services       Precautions / Restrictions Precautions Precautions: Fall Precaution Comments: peritoneal dialysis Restrictions Weight Bearing Restrictions: No      Mobility Bed Mobility Overal bed mobility: Needs Assistance Bed Mobility: Supine to Sit     Supine to sit: Mod assist, +2 for physical assistance, HOB elevated     General bed mobility comments: pt initiated moving LEs off EOB and then reached for son and therapist for assistance. increased time, DOE, HR up to 140 with transfer    Transfers Overall transfer level: Needs assistance Equipment used: 2 person hand held assist Transfers: Sit to/from Stand, Bed to chair/wheelchair/BSC Sit to Stand: Mod assist, +2 physical assistance     Step pivot transfers: Max assist, +2 physical assistance     General transfer comment: max directional verbal and tactile cues, short standing tolerance requiring seated rest break prior to transferring to the chair. Pt antalgia in bilat knees with stepping especially the L knee, limited foot clearance when stepping, wide base of support      Balance Overall balance assessment: Needs assistance Sitting-balance support: Feet supported, Bilateral upper extremity supported Sitting balance-Leahy Scale: Fair Sitting balance - Comments: unable to lean forward without LOB   Standing balance support: Bilateral upper extremity supported, During functional activity, Reliant on assistive device for balance Standing balance-Leahy Scale: Poor Standing balance comment: dependent on external support                           ADL either performed or assessed with clinical judgement   ADL Overall ADL's : Needs assistance/impaired Eating/Feeding: Minimal assistance;Sitting   Grooming: Minimal assistance;Sitting  Grooming Details (indicate cue type and reason): washed face Upper Body Bathing:  Minimal assistance;Sitting   Lower Body Bathing: Maximal assistance           Toilet Transfer: +2 for physical assistance;Moderate assistance   Toileting- Clothing Manipulation and Hygiene: Total assistance               Vision Baseline Vision/History: 2 Legally blind Additional Comments: 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 Blurred vision baseline L eye     Perception     Praxis      Pertinent Vitals/Pain Pain Assessment Pain Assessment: Faces Faces Pain Scale: Hurts whole lot Pain Location: "My ass is raw." Pain Descriptors / Indicators: Grimacing, Discomfort Pain Intervention(s): Monitored during session, Repositioned, Premedicated before session, Limited activity within patient's tolerance     Hand Dominance Right   Extremity/Trunk Assessment Upper Extremity Assessment Upper Extremity Assessment: Generalized weakness   Lower Extremity Assessment Lower Extremity Assessment: Generalized weakness;Defer to PT evaluation   Cervical / Trunk Assessment Cervical / Trunk Assessment: Other exceptions (obesity)   Communication Communication Communication: HOH (extremely HOH despite hearing aides)   Cognition Arousal/Alertness: Awake/alert Behavior During Therapy: Restless Overall Cognitive Status: Impaired/Different from baseline Area of Impairment: Safety/judgement, Problem solving                         Safety/Judgement: Decreased awareness of safety, Decreased awareness of deficits   Problem Solving: Difficulty sequencing, Requires verbal cues, Requires tactile cues General Comments: Son present and reports patients cognition is near baseline but still "a little off." and stated "He doesn't normally talk this loud." Pt with poor safety awareness and slightly impulsive     General Comments  complaining of pain at buttock. pt noted to have a sticker at the top of the buttock and RN TK informed. Rn felt that  it could be the part of the flexiseal that was removed. Max HR during session 148 with peri care and application of a barrier cream. pt with DOE 4out4 and requires seated rest breask    Exercises     Shoulder Instructions      Home Living Family/patient expects to be discharged to:: Private residence Living Arrangements: Spouse/significant other Available Help at Discharge: Family;Available 24 hours/day Type of Home: House Home Access: Stairs to enter [employer] of Steps: 1 Entrance Stairs-Rails: None Home Layout: One level     Bathroom Shower/Tub: Occupational psychologist: Handicapped height     Home Equipment: None          Prior Functioning/Environment Prior Level of Function : Needs assist             Mobility Comments: wasn't using any AD but since pt lost his R eye 3 months ago pt with impaired balance and has fallen 2x ADLs Comments: per son pt's significant other assists him with dressing and bathing as he can't maintain his balance        OT Problem List: Decreased strength;Decreased activity tolerance;Impaired balance (sitting and/or standing);Decreased cognition;Decreased safety awareness;Decreased knowledge of use of DME or AE;Decreased knowledge of precautions;Cardiopulmonary status limiting activity;Obesity      OT Treatment/Interventions: Self-care/ADL training;Therapeutic exercise;DME and/or AE instruction;Energy conservation;Manual therapy;Modalities;Therapeutic activities;Cognitive remediation/compensation;Visual/perceptual remediation/compensation;Patient/family education;Balance training    OT Goals(Current goals can be found in the care plan section) Acute Rehab OT Goals Patient Stated Goal: to go home OT Goal Formulation: With patient/family Time For  Goal Achievement: 11/18/22 Potential to Achieve Goals: Good  OT Frequency: Min 2X/week    Co-evaluation PT/OT/SLP Co-Evaluation/Treatment: Yes Reason for Co-Treatment:  Complexity of the patient's impairments (multi-system involvement);Necessary to address cognition/behavior during functional activity;For patient/therapist safety;To address functional/ADL transfers PT goals addressed during session: Mobility/safety with mobility OT goals addressed during session: ADL's and self-care;Proper use of Adaptive equipment and DME;Strengthening/ROM      AM-PAC OT "6 Clicks" Daily Activity     Outcome Measure Help from another person eating meals?: A Little Help from another person taking care of personal grooming?: A Little Help from another person toileting, which includes using toliet, bedpan, or urinal?: A Lot Help from another person bathing (including washing, rinsing, drying)?: A Lot Help from another person to put on and taking off regular upper body clothing?: A Little Help from another person to put on and taking off regular lower body clothing?: A Lot 6 Click Score: 15   End of Session Equipment Utilized During Treatment: Gait belt;Oxygen Nurse Communication: Mobility status;Precautions  Activity Tolerance: Patient tolerated treatment well Patient left: in chair;with call bell/phone within reach;with chair alarm set;with family/visitor present  OT Visit Diagnosis: Unsteadiness on feet (R26.81);Muscle weakness (generalized) (M62.81)                Time: 1100-1131 OT Time Calculation (min): 31 min Charges:  OT General Charges $OT Visit: 1 Visit OT Evaluation $OT Eval Moderate Complexity: 1 Mod   Brynn, OTR/L  Acute Rehabilitation Services Office: [phone] .   Jeri Modena 11/04/2022, 12:37 PM

## 2022-11-04 NOTE — Progress Notes (Signed)
Date and time results received: 11/04/22 7:41 AM  Test: WBC Critical Value: 113  Name of Provider Notified: Dr Lake Bells CCM  Orders Received? Or Actions Taken?: CCM provider above and patient's primary nurse, Ave Filter, notified. No further orders at this time.

## 2022-11-04 NOTE — Procedures (Signed)
PD Note  Patient very hard of hearing.  Stated he did not have issues with PD machine during the night.  Unsure if he understood the question.  The nurses report that the "slow drain" alarm often triggered.  The treatment was able to be completed with them resuming it each time it alarmed.  There is a small amount of fibrin in effluent.  Patient not able to move easily in bed at this point in his stay.  Will advise nursing to change his position this evening to possibly assist with draining.   Artist Pais, RN  [facility]

## 2022-11-04 NOTE — Progress Notes (Signed)
IP PROGRESS NOTE  Subjective:   Ernest Barnett is alert.  He is asking to eat and go home.  No specific complaint.  Objective: Vital signs in last 24 hours: Blood pressure (!) 86/61, pulse (!) 114, temperature 99.2 F (37.3 C), temperature source Oral, resp. rate (!) 24, weight 257 lb 11.5 oz (116.9 kg), SpO2 (!) 86 %.  Intake/Output from previous day: 12/31 0701 - 01/01 0700 In: 559.4 [I.V.:160.4; IV Piggyback:100.1] Out: 800 [Urine:800]  Physical Exam:  HEENT: No thrush or bleeding Lungs: Clear anteriorly, no respiratory distress Cardiac: Distant heart sounds Abdomen: Nontender, no hepatosplenomegaly, right abdomen dialysis catheter site without evidence of infection Extremities: No leg edema Neurologic: Alert, hard of hearing, agitated, appears confused, follows some commands   Lab Results: Recent Labs    11/02/22 0530 11/03/22 0531  WBC 25.2* 40.3*  42.7*  HGB 7.1* 7.9*  7.9*  HCT 21.6* 23.8*  24.4*  PLT 30* 24*  26*    BMET Recent Labs    11/03/22 0531 11/03/22 0949  NA 140 139  K 3.3* 3.5  CL 110 109  CO2 21* 22  GLUCOSE 121* 132*  BUN 37* 39*  CREATININE 5.02* 5.07*  CALCIUM 8.3* 8.4*    No results found for: "CEA1", "CEA", "UJW119", "CA125"  Studies/Results: No results found.  Medications: I have reviewed the patient's current medications.  Assessment/Plan:  Admission with sepsis syndrome Influenza A/pneumonia 1 blood culture positive for gram-positive cocci in the aerobic bottle only IgG kappa multiple myeloma, currently maintained off of specific therapy Peripheral blood lymphocytosis consistent with plasmacytosis Severe neutropenia, anemia, and thrombocytopenia Granix 11/01/2022 - 11/03/2022 End-stage renal disease maintained on peritoneal hemodialysis Right posterior scleritis September 2023, status post right eye enucleation 08/28/2022 COPD Hypertension Status post splenectomy Mild right hydronephrosis/right ureter stone on CT  abdomen/pelvis 10/29/2022, placement of right ureter stent 10/30/2022 Encephalopathy secondary to critical illness  The sepsis syndrome appears improved.  He remains confused and agitated.  The CBC is pending this morning.  I attempted to discuss the poor prognosis with regard to the myeloma and pancytopenia.  Ernest Barnett is unable to participate in discussion this morning.  I asked the nursing staff to have his family present early tomorrow morning.  The neutropenia responded to G-CSF.  I discontinued the G-CSF yesterday.  We will follow-up on the CBC from today.  Treatment options for the multiple myeloma/plasma cell leukemia are limited given his treatment history and multiple comorbid conditions.  I will submit peripheral blood for a myeloma FISH panel.  He has previously been treated with CyBorD and daratumumab/Velcade/Decadron.  I will repeat a myeloma panel.  We can consider repeat treatment with CyBorD or carfilzomib.  He does not appear to be a candidate for bispecific antibody therapy.  Recommendations: Supportive care/management of respiratory failure per critical care medicine Transfuse platelets for a count of less than 10,000 or bleeding, transfuse packed red cells as needed for symptomatic anemia Serum protein electrophoresis, serum free light chains, peripheral blood myeloma FISH panel Management of end-stage renal failure per nephrology Follow-up peripheral blood flow cytometry I will see him in the early a.m. on 11/05/2022   LOS: 6 days   Betsy Coder, MD   11/04/2022, 6:46 AM

## 2022-11-04 NOTE — Evaluation (Signed)
Physical Therapy Evaluation Patient Details Name: Ernest Barnett MRN: [id number] DOB: October 26, 1956 Today's Date: 11/04/2022  History of Present Illness  67 year old male admitted with AMS, transferred from [facility] on 12/26. Pt found to have ureteral stone with hydroureteronephorosis. Pt underwent R nephrostomy stent and stone removal on 12/26. PMH: morbid obesity, obesity hypoventilation syndrome, noncompliant with CPAP, COPD, end-stage renal disease on peritoneal dialysis, hypertension, hyperlipidemia, and multiple myeloma   Clinical Impression  Pt admitted with above. Pt extremely HOH and has very impaired vision due to having no R eye and previously impaired vision in the L eye. Pt impulsive with decreased insight to deficits and safety. At this time pt requiring modAx2 for OOB mobility and is unable to tolerate ambulation due to DOE of 4/4 and L knee pain. Pt with noted impaired balance and recent fall history. Discussed at length with son regarding patients decline in function and cognition. Pt's son feels that the patient's significant other can continue to help patient at home at this level of care with some assistance from himself and sister. Encouraged son to talk to [name] (patients significant other) to see if she feels comfortable providing 24/7 assist at modAx2. PT to cont to follow and reassess d/c recommendations.        Recommendations for follow up therapy are one component of a multi-disciplinary discharge planning process, led by the attending physician.  Recommendations may be updated based on patient status, additional functional criteria and insurance authorization.  Follow Up Recommendations Home health PT; per son, he feels like [name] can handle him with some help from himself and pt's dtr.  Son repeatedly stated "I know he's a handful." PT/OT asked multiple times if [name] could handle him at this level and son feels she can      Assistance Recommended at Discharge Frequent or  constant Supervision/Assistance  Patient can return home with the following  A lot of help with walking and/or transfers;A lot of help with bathing/dressing/bathroom;Direct supervision/assist for medications management;Assist for transportation;Help with stairs or ramp for entrance    Equipment Recommendations Rolling walker (2 wheels);BSC/3in1  Recommendations for Other Services       Functional Status Assessment Patient has had a recent decline in their functional status and demonstrates the ability to make significant improvements in function in a reasonable and predictable amount of time.     Precautions / Restrictions Precautions Precautions: Fall Precaution Comments: peritoneal dialysis Restrictions Weight Bearing Restrictions: No      Mobility  Bed Mobility Overal bed mobility: Needs Assistance Bed Mobility: Supine to Sit     Supine to sit: Mod assist, +2 for physical assistance, HOB elevated     General bed mobility comments: pt initiated moving LEs off EOB and then reached for son and therapist for assistance. increased time, DOE, HR up to 140 with transfer    Transfers Overall transfer level: Needs assistance Equipment used: 2 person hand held assist Transfers: Sit to/from Stand, Bed to chair/wheelchair/BSC Sit to Stand: Mod assist, +2 physical assistance   Step pivot transfers: Max assist, +2 physical assistance       General transfer comment: max directional verbal and tactile cues, short standing tolerance requiring seated rest break prior to transferring to the chair. Pt antalgia in bilat knees with stepping especially the L knee, limited foot clearance when stepping, wide base of support    Ambulation/Gait               General Gait Details: unable to tolerate  this date due to knee pain and impaired balance in addition to impulsivity and decreased safety awareness  Stairs            Wheelchair Mobility    Modified Rankin (Stroke Patients  Only)       Balance Overall balance assessment: Needs assistance Sitting-balance support: Feet supported, Bilateral upper extremity supported Sitting balance-Leahy Scale: Fair Sitting balance - Comments: unable to lean forward without LOB   Standing balance support: Bilateral upper extremity supported, During functional activity, Reliant on assistive device for balance Standing balance-Leahy Scale: Poor Standing balance comment: dependent on external support                             Pertinent Vitals/Pain Pain Assessment Pain Assessment: Faces Faces Pain Scale: Hurts whole lot Pain Location: "My ass is raw." Pain Intervention(s): Repositioned    Home Living Family/patient expects to be discharged to:: Private residence Living Arrangements: Spouse/significant other Available Help at Discharge: Family;Available 24 hours/day Type of Home: House Home Access: Stairs to enter Entrance Stairs-Rails: None Entrance Stairs-Number of Steps: 1   Home Layout: One level Home Equipment: None      Prior Function Prior Level of Function : Needs assist             Mobility Comments: wasn't using any AD but since pt lost his R eye 3 months ago pt with impaired balance and has fallen 2x ADLs Comments: per son pt's significant other assists him with dressing and bathing as he can't maintain his balance     Hand Dominance   Dominant Hand: Right    Extremity/Trunk Assessment   Upper Extremity Assessment Upper Extremity Assessment: Defer to OT evaluation    Lower Extremity Assessment Lower Extremity Assessment: Generalized weakness (bilat knee pain (L worse than R) limiting WBing tolerance)    Cervical / Trunk Assessment Cervical / Trunk Assessment: Other exceptions (obesity)  Communication   Communication: HOH (extremely HOH despite hearing aides)  Cognition Arousal/Alertness: Awake/alert Behavior During Therapy: Restless Overall Cognitive Status:  Impaired/Different from baseline Area of Impairment: Safety/judgement, Problem solving                         Safety/Judgement: Decreased awareness of safety, Decreased awareness of deficits   Problem Solving: Difficulty sequencing, Requires verbal cues, Requires tactile cues General Comments: Son present and reports patients cognition is near baseline but still "a little off." and stated "He doesn't normally talk this loud." Pt with poor safety awareness and slightly impulsive        General Comments General comments (skin integrity, edema, etc.): bilat LE edema, bruising t/o UEs, pt with aline in R groin, HR in 110s-20 at rest, up to 148 during mobility. Noted 4/4 DOE with OOB mobility, son reports this to be normal    Exercises     Assessment/Plan    PT Assessment Patient needs continued PT services  PT Problem List Decreased strength;Decreased activity tolerance;Decreased balance;Decreased mobility;Decreased coordination;Decreased cognition;Decreased range of motion;Decreased safety awareness       PT Treatment Interventions DME instruction;Gait training;Stair training;Functional mobility training;Therapeutic activities;Therapeutic exercise;Balance training;Neuromuscular re-education    PT Goals (Current goals can be found in the Care Plan section)  Acute Rehab PT Goals Patient Stated Goal: to get his butt to stop hurting PT Goal Formulation: With patient/family Time For Goal Achievement: 11/18/22 Potential to Achieve Goals: Good    Frequency Min 3X/week  Co-evaluation PT/OT/SLP Co-Evaluation/Treatment: Yes Reason for Co-Treatment: To address functional/ADL transfers PT goals addressed during session: Mobility/safety with mobility         AM-PAC PT "6 Clicks" Mobility  Outcome Measure Help needed turning from your back to your side while in a flat bed without using bedrails?: A Lot Help needed moving from lying on your back to sitting on the side of a  flat bed without using bedrails?: A Lot Help needed moving to and from a bed to a chair (including a wheelchair)?: A Lot Help needed standing up from a chair using your arms (e.g., wheelchair or bedside chair)?: A Lot Help needed to walk in hospital room?: A Lot Help needed climbing 3-5 steps with a railing? : Total 6 Click Score: 11    End of Session Equipment Utilized During Treatment: Gait belt Activity Tolerance: Patient limited by fatigue Patient left: in chair;with call bell/phone within reach;with chair alarm set;with family/visitor present;with nursing/sitter in room Nurse Communication: Mobility status PT Visit Diagnosis: Unsteadiness on feet (R26.81);Muscle weakness (generalized) (M62.81);Difficulty in walking, not elsewhere classified (R26.2)    Time: 3710-6269 PT Time Calculation (min) (ACUTE ONLY): 37 min   Charges:   PT Evaluation $PT Eval Moderate Complexity: 1 Mod          Kittie Plater, PT, DPT Acute Rehabilitation Services Secure chat preferred Office #: [phone]   Berline Lopes 11/04/2022, 12:02 PM

## 2022-11-04 NOTE — Progress Notes (Signed)
Patient having increased shortness of breath. Attempted to place patient on bipap, patient ripped bipap mask off and said he wasn't going to wear it.

## 2022-11-04 NOTE — Progress Notes (Signed)
[facility] NEPHROLOGY PROGRESS NOTE  Assessment/ Plan:  # Septic shock due to right lower lobe aspiration pneumonia, possible pyelotreated with Zosyn, completing today.  Off of pressors.  Per primary team.   # ESRD on PD: does PD 6x/wk, off Sat, home unit: [facility] (followed by Dr. Eduard Clos) - using all 1.5% for now; makes good UOP can use diuretics PRN.   Bowel regimen.  Daily PD cath care.  Continue PD.  #AHRF:  12/27 extubated and worsening hypoxia.  CXR with R infiltrate.  Treated with Tamiflu and Zosyn.   #Anemia:  transfuse prn.  Iron replete 12/27.  Heme notes from 11/28 show eval - started B12 injections.    Would defer ESA due to new diagnosis of myeloma.  #IgG Kappa MM, acute plasma cell leukemia, neutropenia: f/b Dr. Delton Coombes; current tx Daratumumab SQ + Bortezomib + Dexamethasone (DaraVd) q21d / Daratumumab SQ q28d. Defer to oncology team.   #AMS: per primary; BUN <<100 doubt uremic -- prob delirium; appears has happened in prior admissions.  He is also hard of hearing.   #Hypokalemia: Potassium level very low therefore receiving both IV and oral potassium chloride.  Subjective: Seen and examined at bedside.  Denies nausea, vomiting, chest pain, shortness of breath.  His son was present at the bedside.  Very hard of hearing.  No new issue with PD. Objective Vital signs in last 24 hours: Vitals:   11/04/22 0700 11/04/22 0800 11/04/22 0900 11/04/22 1000  BP: 99/67 (!) 92/53 (!) 96/57 (!) 129/115  Pulse: (!) 112 (!) 115 (!) 112 (!) 117  Resp: (!) '22 18 15 19  '$ Temp:  98.5 F (36.9 C)    TempSrc:  Axillary    SpO2: (!) 87% 93% (!) 89% (!) 89%  Weight:       Weight change:   Intake/Output Summary (Last 24 hours) at 11/04/2022 1153 Last data filed at 11/04/2022 0818 Gross per 24 hour  Intake 228 ml  Output 1233 ml  Net -1005 ml       Labs: RENAL PANEL Recent Labs    05/02/22 0946 05/16/22 1025 05/30/22 1018 06/13/22 1137 06/27/22 1038  06/27/22 1039 07/07/22 1851 07/09/22 2120 07/10/22 0420 07/25/22 0838 08/19/22 0855 09/24/22 0858 10/01/22 0953 10/15/22 1251 10/29/22 1057 10/29/22 2239 10/30/22 0352 10/30/22 0500 10/31/22 0534 11/01/22 0438 11/02/22 0530 11/03/22 0531 11/03/22 0949 11/04/22 0526  NA 139 140 138 138 137  --  134* 140   < > '135 139 138 139 138 141 141 142 140 143 '$ 143 144 140 139 139  K 3.9 3.0* 3.5 4.0 3.4*  --  3.5 3.5   < > 3.5 3.4* 2.6* 4.0 4.3 3.5 4.3 3.9 3.6 3.5 2.9* 2.9* 3.3* 3.5 2.3*  CL 110 105 103 104 102  --  103 109   < > 101 99 105 107 102 108  --   --  107 109 104 107 110 109 103  CO2 21* '26 26 25 24  '$ --  22 19*   < > '22 27 23 23 23 23  '$ --   --  21* '23 22 22 '$ 21* 22 20*  GLUCOSE 103* 131* 113* 107* 119*  --  109* 127*   < > 132* 146* 168* 102* 108* 113*  --   --  102* 146* 158* 121* 121* 132* 102*  BUN 26* 30* 23 34* 37*  --  39* 31*   < > 48* 37* 31* 39* 39* 44*  --   --  42* 47* 45* 35* 37* 39* 48*  CREATININE 3.43* 3.73* 3.75* 3.86* 3.97*  --  3.99* 3.61*   < > 3.67* 4.29* 4.60* 5.48* 6.51* 7.08*  --   --  7.02* 6.89* 6.40* 4.98* 5.02* 5.07* 5.31*  CALCIUM 9.0 9.2 9.3 9.4 9.2  --  8.8* 9.6   < > 8.6* 9.8 8.4* 8.8* 9.8 8.6*  --   --  8.1* 8.2* 8.5* 8.1* 8.3* 8.4* 8.4*  MG 2.2 2.3 2.1 2.6*  --  2.3  --   --   --  2.5*  --  1.6*  --  2.0  --   --   --  1.6* 2.0  --   --   --   --   --   PHOS  --   --   --   --   --   --   --   --   --   --   --   --   --   --   --   --   --  5.2* 5.3*  --   --   --   --   --   ALBUMIN 3.6 3.9 3.9 3.9 4.0  --  3.7 3.4*  --  3.6 3.8 3.2* 3.3* 3.6 3.2*  --   --   --   --   --   --   --  2.3*  --    < > = values in this interval not displayed.     Liver Function Tests: Recent Labs  Lab 10/29/22 1057 11/03/22 0949  AST 22 48*  ALT 21 44  ALKPHOS 28* 28*  BILITOT 0.6 0.5  PROT 6.6 5.9*  ALBUMIN 3.2* 2.3*   No results for input(s): "LIPASE", "AMYLASE" in the last 168 hours. No results for input(s): "AMMONIA" in the last 168 hours. CBC: Recent  Labs    10/01/22 1209 10/15/22 1251 10/29/22 2120 10/29/22 2239 10/30/22 1723 10/31/22 0534 11/01/22 0438 11/02/22 0530 11/03/22 0531 11/04/22 0526  HGB  --    < >  --    < >  --  7.0* 7.6* 7.1* 7.9*  7.9* 7.2*  MCV  --    < >  --    < >  --  104.0* 100.4* 100.9* 102.1*  103.0* 101.4*  VITAMINB12 120*  --   --   --   --   --   --   --   --   --   FOLATE 18.5  --   --   --   --   --   --   --   --   --   FERRITIN 585*  --   --   --  1,883*  --   --   --   --   --   TIBC 314  --   --   --  241*  --   --   --   --   --   IRON 205*  --   --   --  91  --   --   --   --   --   RETICCTPCT  --   --  1.6  --   --   --   --   --   --   --    < > = values in this interval not displayed.    Cardiac Enzymes: No results for input(s): "CKTOTAL", "CKMB", "CKMBINDEX", "TROPONINI" in the last  168 hours. CBG: Recent Labs  Lab 11/01/22 0844  GLUCAP 157*    Iron Studies: No results for input(s): "IRON", "TIBC", "TRANSFERRIN", "FERRITIN" in the last 72 hours. Studies/Results: No results found.  Medications: Infusions:  sodium chloride     dialysis solution 1.5% low-MG/low-CA     norepinephrine (LEVOPHED) Adult infusion Stopped (11/02/22 0729)   potassium chloride      Scheduled Medications:  sodium chloride   Intravenous Once   Chlorhexidine Gluconate Cloth  6 each Topical Daily   gentamicin cream  1 Application Topical Daily   mometasone-formoterol  2 puff Inhalation BID   polyethylene glycol  17 g Oral Daily   potassium chloride  40 mEq Oral Q4H   QUEtiapine  50 mg Oral BID    have reviewed scheduled and prn medications.  Physical Exam: General:NAD, comfortable Heart:RRR, s1s2 nl Lungs:clear b/l, no crackle Abdomen:soft, Non-tender, non-distended Extremities:No edema Dialysis Access: PD catheter in place  Omayra Tulloch Prasad Enora Trillo 11/04/2022,11:53 AM  LOS: 6 days

## 2022-11-05 DIAGNOSIS — N186 End stage renal disease: Secondary | ICD-10-CM | POA: Diagnosis not present

## 2022-11-05 DIAGNOSIS — Z992 Dependence on renal dialysis: Secondary | ICD-10-CM

## 2022-11-05 DIAGNOSIS — D61818 Other pancytopenia: Secondary | ICD-10-CM | POA: Diagnosis not present

## 2022-11-05 DIAGNOSIS — J101 Influenza due to other identified influenza virus with other respiratory manifestations: Secondary | ICD-10-CM | POA: Diagnosis not present

## 2022-11-05 LAB — CBC WITH DIFFERENTIAL/PLATELET
Abs Immature Granulocytes: 11.14 10*3/uL — ABNORMAL HIGH (ref 0.00–0.07)
Basophils Absolute: 0.2 10*3/uL — ABNORMAL HIGH (ref 0.0–0.1)
Basophils Relative: 0 %
Eosinophils Absolute: 0 10*3/uL (ref 0.0–0.5)
Eosinophils Relative: 0 %
HCT: 19.9 % — ABNORMAL LOW (ref 39.0–52.0)
Hemoglobin: 6.6 g/dL — CL (ref 13.0–17.0)
Immature Granulocytes: 9 %
Lymphocytes Relative: 5 %
Lymphs Abs: 5.6 10*3/uL — ABNORMAL HIGH (ref 0.7–4.0)
MCH: 33.2 pg (ref 26.0–34.0)
MCHC: 33.2 g/dL (ref 30.0–36.0)
MCV: 100 fL (ref 80.0–100.0)
Monocytes Absolute: 90.4 10*3/uL — ABNORMAL HIGH (ref 0.1–1.0)
Monocytes Relative: 72 %
Neutro Abs: 18 10*3/uL — ABNORMAL HIGH (ref 1.7–7.7)
Neutrophils Relative %: 14 %
Platelets: 37 10*3/uL — ABNORMAL LOW (ref 150–400)
RBC: 1.99 MIL/uL — ABNORMAL LOW (ref 4.22–5.81)
RDW: 24.7 % — ABNORMAL HIGH (ref 11.5–15.5)
WBC: 125.3 10*3/uL (ref 4.0–10.5)
nRBC: 0.1 % (ref 0.0–0.2)

## 2022-11-05 LAB — PREPARE RBC (CROSSMATCH)

## 2022-11-05 LAB — RENAL FUNCTION PANEL
Albumin: 2.5 g/dL — ABNORMAL LOW (ref 3.5–5.0)
Albumin: 2.4 g/dL — ABNORMAL LOW (ref 3.5–5.0)
Anion gap: 15 (ref 5–15)
Anion gap: 14 (ref 5–15)
BUN: 44 mg/dL — ABNORMAL HIGH (ref 8–23)
BUN: 45 mg/dL — ABNORMAL HIGH (ref 8–23)
CO2: 19 mmol/L — ABNORMAL LOW (ref 22–32)
CO2: 17 mmol/L — ABNORMAL LOW (ref 22–32)
Calcium: 8.6 mg/dL — ABNORMAL LOW (ref 8.9–10.3)
Calcium: 8 mg/dL — ABNORMAL LOW (ref 8.9–10.3)
Chloride: 106 mmol/L (ref 98–111)
Chloride: 109 mmol/L (ref 98–111)
Creatinine, Ser: 5.3 mg/dL — ABNORMAL HIGH (ref 0.61–1.24)
Creatinine, Ser: 5.3 mg/dL — ABNORMAL HIGH (ref 0.61–1.24)
GFR, Estimated: 11 mL/min — ABNORMAL LOW (ref >60)
GFR, Estimated: 11 mL/min — ABNORMAL LOW (ref >60)
Glucose, Bld: 119 mg/dL — ABNORMAL HIGH (ref 70–99)
Glucose, Bld: 106 mg/dL — ABNORMAL HIGH (ref 70–99)
Phosphorus: 1.2 mg/dL — ABNORMAL LOW (ref 2.5–4.6)
Phosphorus: 2.3 mg/dL — ABNORMAL LOW (ref 2.5–4.6)
Potassium: 2.5 mmol/L — CL (ref 3.5–5.1)
Potassium: 4 mmol/L (ref 3.5–5.1)
Sodium: 140 mmol/L (ref 135–145)
Sodium: 140 mmol/L (ref 135–145)

## 2022-11-05 LAB — MAGNESIUM: Magnesium: 1.7 mg/dL (ref 1.7–2.4)

## 2022-11-05 MED ORDER — ALLOPURINOL 100 MG PO TABS
50.0000 mg | ORAL_TABLET | Freq: Every day | ORAL | Status: DC
  Administered 2022-11-05 – 2022-11-06 (×2): 50 mg via ORAL
  Filled 2022-11-05 (×3): qty 1

## 2022-11-05 MED ORDER — SODIUM CHLORIDE 0.9% IV SOLUTION: Freq: Once | INTRAVENOUS | Status: DC

## 2022-11-05 MED ORDER — RISAQUAD PO CAPS
1.0000 | ORAL_CAPSULE | Freq: Every day | ORAL | Status: DC
  Administered 2022-11-05 – 2022-11-06 (×2): 1 via ORAL
  Filled 2022-11-05 (×3): qty 1

## 2022-11-05 MED ORDER — GERHARDT'S BUTT CREAM
TOPICAL_CREAM | Freq: Three times a day (TID) | CUTANEOUS | Status: DC
  Filled 2022-11-05 (×2): qty 1

## 2022-11-05 MED ORDER — POTASSIUM CHLORIDE CRYS ER 20 MEQ PO TBCR
40.0000 meq | EXTENDED_RELEASE_TABLET | Freq: Four times a day (QID) | ORAL | Status: AC
  Administered 2022-11-05 – 2022-11-06 (×4): 40 meq via ORAL
  Filled 2022-11-05 (×3): qty 2

## 2022-11-05 MED ORDER — ONDANSETRON HCL 4 MG/2ML IJ SOLN
4.0000 mg | Freq: Four times a day (QID) | INTRAMUSCULAR | Status: DC | PRN
  Administered 2022-11-05: 4 mg via INTRAVENOUS
  Filled 2022-11-05: qty 2

## 2022-11-05 MED ORDER — CALCITRIOL 0.25 MCG PO CAPS
0.2500 ug | ORAL_CAPSULE | Freq: Every day | ORAL | Status: DC
  Administered 2022-11-05 – 2022-11-06 (×2): 0.25 ug via ORAL
  Filled 2022-11-05 (×3): qty 1

## 2022-11-05 MED ORDER — HYDROXYUREA 500 MG PO CAPS
1000.0000 mg | ORAL_CAPSULE | Freq: Once | ORAL | Status: DC
  Filled 2022-11-05 (×2): qty 2

## 2022-11-05 MED ORDER — POTASSIUM CHLORIDE 10 MEQ/100ML IV SOLN
10.0000 meq | INTRAVENOUS | Status: AC
  Administered 2022-11-05 (×3): 10 meq via INTRAVENOUS
  Filled 2022-11-05 (×3): qty 100

## 2022-11-05 MED ORDER — POTASSIUM PHOSPHATES 15 MMOLE/5ML IV SOLN
15.0000 mmol | Freq: Once | INTRAVENOUS | Status: AC
  Administered 2022-11-05: 15 mmol via INTRAVENOUS
  Filled 2022-11-05: qty 5

## 2022-11-05 NOTE — Progress Notes (Signed)
[facility] NEPHROLOGY PROGRESS NOTE  Assessment/ Plan:  # Septic shock due to right lower lobe aspiration pneumonia, possible pyelo; treated with Zosyn.  Off of pressors.  Per primary team.   # ESRD on PD: does PD 6x/wk, off Sat, home unit: [facility] (followed by Dr. Eduard Clos) - using all 1.5% for now; makes good UOP can use diuretics PRN.   Bowel regimen.  Daily PD cath care.  Continue PD.  #AHRF:  12/27 extubated and worsening hypoxia.  CXR with R infiltrate.  Treated with Tamiflu and Zosyn.   #Anemia:  transfuse prn.  Iron replete 12/27.  Heme notes from 11/28 show eval - started B12 injections.    Would defer ESA due to new diagnosis of myeloma.  #IgG Kappa MM, acute plasma cell leukemia, neutropenia: f/b Dr. Delton Coombes; current tx Daratumumab SQ + Bortezomib + Dexamethasone (DaraVd) q21d / Daratumumab SQ q28d.  Management as per oncology team.     #AMS: per primary; BUN <<100 doubt uremic -- prob delirium; appears has happened in prior admissions.  He is also hard of hearing.   #Hypokalemia: Probably due to PD and decreased oral intake.  Magnesium level acceptable.  Continue IV and oral potassium chloride repletion.  Orders adjusted.  Repeat lab in the afternoon.  # Hypophosphatemia: Not on binders.  Corrected calcium level acceptable.  I will start calcitriol, check PTH level.  Discussed with dialysis nurse.  Subjective: Seen and examined at bedside.  He is quite somnolent this morning.  PD with around 800 cc net UF.  Electrolytes abnormalities discussed with the nurse and ordered repletion.  Discussed with his son at bedside as well. Objective Vital signs in last 24 hours: Vitals:   11/05/22 0500 11/05/22 0600 11/05/22 0800 11/05/22 0855  BP:   121/81   Pulse:      Resp: (!) 21 (!) 21 (!) 25   Temp:   97.8 F (36.6 C)   TempSrc:   Oral   SpO2:   92% 94%  Weight:       Weight change:   Intake/Output Summary (Last 24 hours) at 11/05/2022 1254 Last data filed  at 11/05/2022 0951 Gross per 24 hour  Intake 1300.27 ml  Output 300 ml  Net 1000.27 ml        Labs: RENAL PANEL Recent Labs    05/16/22 1025 05/30/22 1018 06/13/22 1137 06/27/22 1038 06/27/22 1039 07/07/22 1851 07/09/22 2120 07/10/22 0420 07/25/22 8032 08/19/22 0855 09/24/22 0858 10/01/22 0953 10/15/22 1251 10/29/22 1057 10/29/22 2239 10/30/22 0352 10/30/22 0500 10/31/22 0534 11/01/22 0438 11/02/22 0530 11/03/22 0531 11/03/22 0949 11/04/22 0526 11/05/22 0504  NA 140 138 138   < >  --  134* 140   < > '135 139 138 139 138 141 141 142 140 143 '$ 143 144 140 139 139 140  K 3.0* 3.5 4.0   < >  --  3.5 3.5   < > 3.5 3.4* 2.6* 4.0 4.3 3.5 4.3 3.9 3.6 3.5 2.9* 2.9* 3.3* 3.5 2.3* 2.5*  CL 105 103 104   < >  --  103 109   < > 101 99 105 107 102 108  --   --  107 109 104 107 110 109 103 106  CO2 '26 26 25   '$ < >  --  22 19*   < > '22 27 23 23 23 23  '$ --   --  21* '23 22 22 '$ 21* 22 20* 19*  GLUCOSE 131* 113* 107*   < >  --  109* 127*   < > 132* 146* 168* 102* 108* 113*  --   --  102* 146* 158* 121* 121* 132* 102* 119*  BUN 30* 23 34*   < >  --  39* 31*   < > 48* 37* 31* 39* 39* 44*  --   --  42* 47* 45* 35* 37* 39* 48* 44*  CREATININE 3.73* 3.75* 3.86*   < >  --  3.99* 3.61*   < > 3.67* 4.29* 4.60* 5.48* 6.51* 7.08*  --   --  7.02* 6.89* 6.40* 4.98* 5.02* 5.07* 5.31* 5.30*  CALCIUM 9.2 9.3 9.4   < >  --  8.8* 9.6   < > 8.6* 9.8 8.4* 8.8* 9.8 8.6*  --   --  8.1* 8.2* 8.5* 8.1* 8.3* 8.4* 8.4* 8.6*  MG 2.3 2.1 2.6*  --  2.3  --   --   --  2.5*  --  1.6*  --  2.0  --   --   --  1.6* 2.0  --   --   --   --   --  1.7  PHOS  --   --   --   --   --   --   --   --   --   --   --   --   --   --   --   --  5.2* 5.3*  --   --   --   --   --  1.2*  ALBUMIN 3.9 3.9 3.9   < >  --  3.7 3.4*  --  3.6 3.8 3.2* 3.3* 3.6 3.2*  --   --   --   --   --   --   --  2.3*  --  2.5*   < > = values in this interval not displayed.      Liver Function Tests: Recent Labs  Lab 11/03/22 0949 11/05/22 0504  AST 48*   --   ALT 44  --   ALKPHOS 28*  --   BILITOT 0.5  --   PROT 5.9*  --   ALBUMIN 2.3* 2.5*    No results for input(s): "LIPASE", "AMYLASE" in the last 168 hours. No results for input(s): "AMMONIA" in the last 168 hours. CBC: Recent Labs    10/01/22 1209 10/15/22 1251 10/29/22 2120 10/29/22 2239 10/30/22 1723 10/31/22 0534 11/01/22 0438 11/02/22 0530 11/03/22 0531 11/04/22 0526  HGB  --    < >  --    < >  --  7.0* 7.6* 7.1* 7.9*  7.9* 7.2*  MCV  --    < >  --    < >  --  104.0* 100.4* 100.9* 102.1*  103.0* 101.4*  VITAMINB12 120*  --   --   --   --   --   --   --   --   --   FOLATE 18.5  --   --   --   --   --   --   --   --   --   FERRITIN 585*  --   --   --  1,883*  --   --   --   --   --   TIBC 314  --   --   --  241*  --   --   --   --   --   IRON 205*  --   --   --  91  --   --   --   --   --   RETICCTPCT  --   --  1.6  --   --   --   --   --   --   --    < > = values in this interval not displayed.     Cardiac Enzymes: No results for input(s): "CKTOTAL", "CKMB", "CKMBINDEX", "TROPONINI" in the last 168 hours. CBG: Recent Labs  Lab 11/01/22 0844  GLUCAP 157*     Iron Studies: No results for input(s): "IRON", "TIBC", "TRANSFERRIN", "FERRITIN" in the last 72 hours. Studies/Results: No results found.  Medications: Infusions:  sodium chloride     dialysis solution 1.5% low-MG/low-CA     potassium PHOSPHATE IVPB (in mmol) 43 mL/hr at 11/05/22 0900    Scheduled Medications:  sodium chloride   Intravenous Once   acidophilus  1 capsule Oral Daily   Chlorhexidine Gluconate Cloth  6 each Topical Daily   gentamicin cream  1 Application Topical Daily   mometasone-formoterol  2 puff Inhalation BID   potassium chloride  40 mEq Oral Q6H   QUEtiapine  50 mg Oral BID    have reviewed scheduled and prn medications.  Physical Exam: General: Somnolent, not in distress Heart:RRR, s1s2 nl Lungs:clear b/l, no crackle Abdomen:soft, Non-tender,  non-distended Extremities:No edema Dialysis Access: PD catheter in place  Laurinda Carreno Prasad Arden Axon 11/05/2022,12:54 PM  LOS: 7 days

## 2022-11-05 NOTE — Progress Notes (Signed)
PROGRESS NOTE    Ernest Barnett  WTU:[id number] DOB: [birth year] DOA: 10/29/2022 PCP: Lindell Spar, MD    Brief Narrative:  67 y/o male with multiple medical problems presented to [facility] with abdominal pain and fever to 103. Found to be FLU A positive. Also noted to have 57mmR ureteral stone with hydroureteronephrosis. Moved to the ICU for sepsis, intubated and take to the OR for R nephrostomy stent and stone removal.   12/26 admitted, to OR with urology for cystoscopy and right ureteral stent; GPC 1/4 bottles 12/27 extubated, confused, found to have RLL infiltrate, on BIPAP and HHF O2 and precedex.  Peritoneal fluid analysis showed 12 WBC. Changed from ceftriaxone to zosyn.  12/28 Agitation improved yesterday  Had worsening shock and work of breathing last night peripheral smear: acute plasma cell leukemia Hematology consult: could consider G-CSF and salvage chemo if he survives this illness [facility]  Assessment and Plan: Severe sepsis due to: right lower lobe aspiration pneumonia, possibly obstructive pyelo though CT did not show stranding around kidney GPC 1/4 BC likely contaminant Zosyn stops 1/1 Monitor cultures   Acute metabolic encephalopathy with combativeness > related to prior frontal lobe stroke and acute infection (has had this before) ICU delirium> severe intermittent agitation, improved 12/31 Seroquel -delirium precautions  Precedex wean off Out of bed if able PT/OT-SNF   ESRD on PD Hypokalemia Ureteral stone s/p R uretal stent PD per renal  Monitor BMET and UOP Replace electrolytes per renal   Acute on chronic macrocytic anemia with severe thrombocytopenia Multiple myeloma Acute plasma cell leukemia, poor prognosis Neutropenia Transfuse PRBC for Hgb < 7 gm/dL F/u with hematology on 1/2; flow cytometry pending ? Inpatient treatment for leukemia Continue supportive care   Nausea -PRN zofran  Obesity Estimated body mass index is 40.97 kg/m as  calculated from the following:   Height as of 10/22/22: 5' 6.5" (1.689 m).   Weight as of this encounter: 116.9 kg.     Poor overall prognosis  DVT prophylaxis: SCDs Start: 10/29/22 1947    Code Status: DNR Family Communication: at bedside  Disposition Plan:  Level of care: Progressive Status is: Inpatient Remains inpatient appropriate because: sick    Consultants:  PCCM Oncology renal   Subjective: Nursing reports loose stools this AM and vomiting   Objective: Vitals:   11/05/22 0345 11/05/22 0400 11/05/22 0500 11/05/22 0600  BP:  102/72    Pulse: (!) 125 (!) 119    Resp: 18 18 (!) 21 (!) 21  Temp:      TempSrc:      SpO2: 90% 91%    Weight:        Intake/Output Summary (Last 24 hours) at 11/05/2022 0735 Last data filed at 11/05/2022 0700 Gross per 24 hour  Intake 237.16 ml  Output 733 ml  Net -495.84 ml   Filed Weights   10/30/22 0130  Weight: 116.9 kg    Examination:   General: Appearance:    Severely obese male in no acute distress     Lungs:      respirations unlabored  Heart:    Tachycardic.     MS:   All extremities are intact.    Neurologic:   Awake, alert but confused       Data Reviewed: I have personally reviewed following labs and imaging studies  CBC: Recent Labs  Lab 10/29/22 1025 10/29/22 2239 10/31/22 0534 11/01/22 0438 11/02/22 0530 11/03/22 0531 11/04/22 0526  WBC  35.5*   < > 13.3* 16.1* 25.2* 40.3*  42.7* 113.0*  NEUTROABS 0.4*  --   --  0.2*  --  3.2 1.1*  HGB 6.0*   < > 7.0* 7.6* 7.1* 7.9*  7.9* 7.2*  HCT 18.4*   < > 21.0* 22.4* 21.6* 23.8*  24.4* 21.4*  MCV 110.8*   < > 104.0* 100.4* 100.9* 102.1*  103.0* 101.4*  PLT 27*   < > 39* 33* 30* 24*  26* 39*   < > = values in this interval not displayed.   Basic Metabolic Panel: Recent Labs  Lab 10/30/22 0500 10/31/22 0534 11/01/22 0438 11/02/22 0530 11/03/22 0531 11/03/22 0949 11/04/22 0526 11/05/22 0504  NA 140 143   < > 144 140 139 139 140  K 3.6  3.5   < > 2.9* 3.3* 3.5 2.3* 2.5*  CL 107 109   < > 107 110 109 103 106  CO2 21* 23   < > 22 21* 22 20* 19*  GLUCOSE 102* 146*   < > 121* 121* 132* 102* 119*  BUN 42* 47*   < > 35* 37* 39* 48* 44*  CREATININE 7.02* 6.89*   < > 4.98* 5.02* 5.07* 5.31* 5.30*  CALCIUM 8.1* 8.2*   < > 8.1* 8.3* 8.4* 8.4* 8.6*  MG 1.6* 2.0  --   --   --   --   --  1.7  PHOS 5.2* 5.3*  --   --   --   --   --  1.2*   < > = values in this interval not displayed.   GFR: Estimated Creatinine Clearance: 16.9 mL/min (A) (by C-G formula based on SCr of 5.3 mg/dL (H)). Liver Function Tests: Recent Labs  Lab 10/29/22 1057 11/03/22 0949 11/05/22 0504  AST 22 48*  --   ALT 21 44  --   ALKPHOS 28* 28*  --   BILITOT 0.6 0.5  --   PROT 6.6 5.9*  --   ALBUMIN 3.2* 2.3* 2.5*   No results for input(s): "LIPASE", "AMYLASE" in the last 168 hours. No results for input(s): "AMMONIA" in the last 168 hours. Coagulation Profile: Recent Labs  Lab 10/29/22 2120 10/30/22 1723  INR 1.3* 1.4*   Cardiac Enzymes: No results for input(s): "CKTOTAL", "CKMB", "CKMBINDEX", "TROPONINI" in the last 168 hours. BNP (last 3 results) No results for input(s): "PROBNP" in the last 8760 hours. HbA1C: No results for input(s): "HGBA1C" in the last 72 hours. CBG: Recent Labs  Lab 11/01/22 0844  GLUCAP 157*   Lipid Profile: No results for input(s): "CHOL", "HDL", "LDLCALC", "TRIG", "CHOLHDL", "LDLDIRECT" in the last 72 hours. Thyroid Function Tests: No results for input(s): "TSH", "T4TOTAL", "FREET4", "T3FREE", "THYROIDAB" in the last 72 hours. Anemia Panel: No results for input(s): "VITAMINB12", "FOLATE", "FERRITIN", "TIBC", "IRON", "RETICCTPCT" in the last 72 hours. Sepsis Labs: Recent Labs  Lab 10/29/22 1025 10/29/22 1251  LATICACIDVEN 1.4 0.9    Recent Results (from the past 240 hour(s))  Resp panel by RT-PCR (RSV, Flu A&B, Covid) Anterior Nasal Swab     Status: Abnormal   Collection Time: 10/29/22 10:24 AM   Specimen:  Anterior Nasal Swab  Result Value Ref Range Status   SARS Coronavirus 2 by RT PCR NEGATIVE NEGATIVE Final    Comment: (NOTE) SARS-CoV-2 target nucleic acids are NOT DETECTED.  The SARS-CoV-2 RNA is generally detectable in upper respiratory specimens during the acute phase of infection. The lowest concentration of SARS-CoV-2 viral copies this assay  can detect is 138 copies/mL. A negative result does not preclude SARS-Cov-2 infection and should not be used as the sole basis for treatment or other patient management decisions. A negative result may occur with  improper specimen collection/handling, submission of specimen other than nasopharyngeal swab, presence of viral mutation(s) within the areas targeted by this assay, and inadequate number of viral copies(<138 copies/mL). A negative result must be combined with clinical observations, patient history, and epidemiological information. The expected result is Negative.  Fact Sheet for Patients:  EntrepreneurPulse.com.au  Fact Sheet for Healthcare Providers:  IncredibleEmployment.be  This test is no t yet approved or cleared by the [place] FDA and  has been authorized for detection and/or diagnosis of SARS-CoV-2 by FDA under an Emergency Use Authorization (EUA). This EUA will remain  in effect (meaning this test can be used) for the duration of the COVID-19 declaration under Section 564(b)(1) of the Act, 21 U.S.C.section 360bbb-3(b)(1), unless the authorization is terminated  or revoked sooner.       Influenza A by PCR POSITIVE (A) NEGATIVE Final   Influenza B by PCR NEGATIVE NEGATIVE Final    Comment: (NOTE) The Xpert Xpress SARS-CoV-2/FLU/RSV plus assay is intended as an aid in the diagnosis of influenza from Nasopharyngeal swab specimens and should not be used as a sole basis for treatment. Nasal washings and aspirates are unacceptable for Xpert Xpress  SARS-CoV-2/FLU/RSV testing.  Fact Sheet for Patients: EntrepreneurPulse.com.au  Fact Sheet for Healthcare Providers: IncredibleEmployment.be  This test is not yet approved or cleared by the [place] FDA and has been authorized for detection and/or diagnosis of SARS-CoV-2 by FDA under an Emergency Use Authorization (EUA). This EUA will remain in effect (meaning this test can be used) for the duration of the COVID-19 declaration under Section 564(b)(1) of the Act, 21 U.S.C. section 360bbb-3(b)(1), unless the authorization is terminated or revoked.     Resp Syncytial Virus by PCR NEGATIVE NEGATIVE Final    Comment: (NOTE) Fact Sheet for Patients: EntrepreneurPulse.com.au  Fact Sheet for Healthcare Providers: IncredibleEmployment.be  This test is not yet approved or cleared by the [place] FDA and has been authorized for detection and/or diagnosis of SARS-CoV-2 by FDA under an Emergency Use Authorization (EUA). This EUA will remain in effect (meaning this test can be used) for the duration of the COVID-19 declaration under Section 564(b)(1) of the Act, 21 U.S.C. section 360bbb-3(b)(1), unless the authorization is terminated or revoked.  Performed at [facility], [address], [place], [place] [address]   Blood culture (routine x 2)     Status: None   Collection Time: 10/29/22 10:25 AM   Specimen: BLOOD  Result Value Ref Range Status   Specimen Description BLOOD LEFT ARM  Final   Special Requests   Final    BOTTLES DRAWN AEROBIC AND ANAEROBIC Blood Culture results may not be optimal due to an inadequate volume of blood received in culture bottles   Culture   Final    NO GROWTH 5 DAYS Performed at [facility], [address] [place], [place] [address]    Report Status 11/03/2022 FINAL  Final  Blood culture (routine x 2)     Status: None   Collection Time: 10/29/22 10:58 AM   Specimen: BLOOD   Result Value Ref Range Status   Specimen Description   Final    BLOOD RIGHT HAND Performed at [facility], [address] [place], [place] [address]    Special Requests   Final  BOTTLES DRAWN AEROBIC AND ANAEROBIC Blood Culture results may not be optimal due to an excessive volume of blood received in culture bottles Performed at [facility], [address] [place], [place] [address]    Culture  Setup Time   Final    NO ORGANISMS SEEN PREVIOUSLY REPORTED AS: GRAM POSITIVE COCCI CORRECTED RESULTS CALLED TO: [place], AT 1116 11/01/22 D. VANHOOK Performed at [facility], [place] [address] [place], [place] [address]    Culture   Final    NO GROWTH 5 DAYS Performed at [facility], [address] [place], [place] [address]    Report Status 11/03/2022 FINAL  Final  Urine Culture     Status: Abnormal   Collection Time: 10/29/22  9:28 PM   Specimen: Urine, Catheterized  Result Value Ref Range Status   Specimen Description URINE, CATHETERIZED  Final   Special Requests   Final    NONE Performed at [facility], [place] [address] [place], [place] [address]    Culture MULTIPLE SPECIES PRESENT, SUGGEST RECOLLECTION (A)  Final   Report Status 10/31/2022 FINAL  Final  MRSA Next Gen by PCR, Nasal     Status: None   Collection Time: 10/30/22 12:00 AM   Specimen: Nasal Mucosa; Nasal Swab  Result Value Ref Range Status   MRSA by PCR Next Gen NOT DETECTED NOT DETECTED Final    Comment: (NOTE) The GeneXpert MRSA Assay (FDA approved for NASAL specimens only), is one component of a comprehensive MRSA colonization surveillance program. It is not intended to diagnose MRSA infection nor to guide or monitor treatment for MRSA infections. Test performance is not FDA approved in patients less than 40 years old. Performed at [facility], [place] [address] [place], [place] [address]   Body fluid culture w Gram Stain     Status: None   Collection Time: 10/30/22 10:28 AM    Specimen: Peritoneal Washings; Body Fluid  Result Value Ref Range Status   Specimen Description PERITONEAL  Final   Special Requests PERITONEAL DIALYSATE  Final   Gram Stain NO WBC SEEN NO ORGANISMS SEEN CYTOSPIN SMEAR   Final   Culture   Final    NO GROWTH 3 DAYS Performed at [facility], [address], [place], [place] [address]    Report Status 11/03/2022 FINAL  Final         Radiology Studies: No results found.      Scheduled Meds:  sodium chloride   Intravenous Once   Chlorhexidine Gluconate Cloth  6 each Topical Daily   gentamicin cream  1 Application Topical Daily   mometasone-formoterol  2 puff Inhalation BID   polyethylene glycol  17 g Oral Daily   QUEtiapine  50 mg Oral BID   Continuous Infusions:  sodium chloride     dialysis solution 1.5% low-MG/low-CA     norepinephrine (LEVOPHED) Adult infusion Stopped (11/02/22 0729)   potassium chloride Stopped (11/05/22 0658)   potassium PHOSPHATE IVPB (in mmol) 43 mL/hr at 11/05/22 0700     LOS: 7 days    Time spent: 45 minutes spent on chart review, discussion with nursing staff, consultants, updating family and interview/physical exam; more than 50% of that time was spent in counseling and/or coordination of care.    Geradine Girt, DO [facility] Available via Epic secure chat 7am-7pm After these hours, please refer to coverage provider listed on [email or web address] 11/05/2022, 7:35 AM

## 2022-11-05 NOTE — Progress Notes (Signed)
Physical Therapy Treatment Patient Details Name: Ernest Barnett MRN: [id number] DOB: [birth year] Today's Date: 11/05/2022   History of Present Illness 67 year old male admitted with AMS, transferred from [facility] on 12/26. Pt found to have ureteral stone with hydroureteronephorosis. Pt underwent R nephrostomy stent and stone removal on 12/26. PMH: morbid obesity, obesity hypoventilation syndrome, noncompliant with CPAP, COPD, end-stage renal disease on peritoneal dialysis, hypertension, hyperlipidemia, and multiple myeloma    PT Comments    Pt sleeping in bed on arrival. Pt began to rouse with initiation of mobility. Pt is very HOH which affects his ability to follow commands consistently. He required +2 max assist supine to sit, +2 max assist sit to stand, and +2 max assist step pivot transfer to recliner. Pt in recliner with feet elevated at end of session.    Recommendations for follow up therapy are one component of a multi-disciplinary discharge planning process, led by the attending physician.  Recommendations may be updated based on patient status, additional functional criteria and insurance authorization.  Follow Up Recommendations  Skilled nursing-short term rehab (<3 hours/day)     Assistance Recommended at Discharge Frequent or constant Supervision/Assistance  Patient can return home with the following A lot of help with walking and/or transfers;A lot of help with bathing/dressing/bathroom;Direct supervision/assist for medications management;Assist for transportation;Help with stairs or ramp for entrance   Equipment Recommendations  Rolling walker (2 wheels);BSC/3in1    Recommendations for Other Services       Precautions / Restrictions Precautions Precautions: Fall Precaution Comments: peritoneal dialysis     Mobility  Bed Mobility Overal bed mobility: Needs Assistance Bed Mobility: Rolling, Supine to Sit Rolling: +2 for physical assistance, Total assist   Supine to sit:  +2 for physical assistance, Max assist     General bed mobility comments: sleepy on arrival but easily roused with initiation of mobility    Transfers Overall transfer level: Needs assistance Equipment used: 2 person hand held assist Transfers: Sit to/from Stand, Bed to chair/wheelchair/BSC Sit to Stand: +2 physical assistance, +2 safety/equipment, Max assist   Step pivot transfers: +2 physical assistance, +2 safety/equipment, Max assist       General transfer comment: uncontrolled descent into recliner    Ambulation/Gait                   Stairs             Wheelchair Mobility    Modified Rankin (Stroke Patients Only)       Balance Overall balance assessment: Needs assistance Sitting-balance support: Bilateral upper extremity supported, Feet supported Sitting balance-Leahy Scale: Fair     Standing balance support: Bilateral upper extremity supported, During functional activity Standing balance-Leahy Scale: Poor Standing balance comment: dependent on external support                            Cognition Arousal/Alertness: Awake/alert Behavior During Therapy: Restless Overall Cognitive Status: Impaired/Different from baseline Area of Impairment: Awareness, Safety/judgement, Problem solving                         Safety/Judgement: Decreased awareness of safety, Decreased awareness of deficits Awareness: Emergent Problem Solving: Difficulty sequencing, Requires verbal cues, Requires tactile cues General Comments: pt HOH and affecting understanding of what therapists are requesting        Exercises      General Comments General comments (skin integrity, edema, etc.): pt with incontinence of  stool noted on bed surface. pt increased risk for skin break down due need to (A) with transfer      Pertinent Vitals/Pain Pain Assessment Pain Assessment: Faces Faces Pain Scale: Hurts even more Pain Location: bilat knees Pain  Descriptors / Indicators: Discomfort, Grimacing, Guarding Pain Intervention(s): Monitored during session, Repositioned, Limited activity within patient's tolerance    Home Living                          Prior Function            PT Goals (current goals can now be found in the care plan section) Acute Rehab PT Goals Patient Stated Goal: not stated Progress towards PT goals: Progressing toward goals    Frequency    Min 3X/week      PT Plan Discharge plan needs to be updated    Co-evaluation PT/OT/SLP Co-Evaluation/Treatment: Yes Reason for Co-Treatment: Complexity of the patient's impairments (multi-system involvement);To address functional/ADL transfers;For patient/therapist safety PT goals addressed during session: Mobility/safety with mobility;Balance OT goals addressed during session: ADL's and self-care;Proper use of Adaptive equipment and DME;Strengthening/ROM      AM-PAC PT "6 Clicks" Mobility   Outcome Measure  Help needed turning from your back to your side while in a flat bed without using bedrails?: A Lot Help needed moving from lying on your back to sitting on the side of a flat bed without using bedrails?: A Lot Help needed moving to and from a bed to a chair (including a wheelchair)?: Total Help needed standing up from a chair using your arms (e.g., wheelchair or bedside chair)?: Total Help needed to walk in hospital room?: Total Help needed climbing 3-5 steps with a railing? : Total 6 Click Score: 8    End of Session Equipment Utilized During Treatment: Gait belt Activity Tolerance: Patient tolerated treatment well Patient left: in chair;with chair alarm set;with call bell/phone within reach Nurse Communication: Mobility status PT Visit Diagnosis: Unsteadiness on feet (R26.81);Muscle weakness (generalized) (M62.81);Difficulty in walking, not elsewhere classified (R26.2)     Time: 1004-1030 PT Time Calculation (min) (ACUTE ONLY): 26  min  Charges:  $Therapeutic Activity: 8-22 mins                     Lorrin Goodell, PT  Office # [phone] Pager [phone]    Lorriane Shire 11/05/2022, 12:33 PM

## 2022-11-05 NOTE — Progress Notes (Signed)
IP PROGRESS NOTE  Subjective:   [name] is alert.  His family is at the bedside.  Objective: Vital signs in last 24 hours: Blood pressure 121/81, pulse (!) 119, temperature 97.8 F (36.6 C), temperature source Oral, resp. rate (!) 25, weight 257 lb 11.5 oz (116.9 kg), SpO2 94 %.  Intake/Output from previous day: 01/01 0701 - 01/02 0700 In: 237.2 [IV Piggyback:237.2] Out: 733 [Urine:300]  Physical Exam:  HEENT: No thrush or bleeding  Abdomen: Nontender, no hepatosplenomegaly, right abdomen dialysis catheter site with a gauze dressing Extremities: No leg edema Neurologic: Alert, hard of hearing, more cooperative today, follows commands, conversant Skin: Few ecchymoses over the arms   Lab Results: Recent Labs    11/03/22 0531 11/04/22 0526  WBC 40.3*  42.7* 113.0*  HGB 7.9*  7.9* 7.2*  HCT 23.8*  24.4* 21.4*  PLT 24*  26* 39*  Peripheral blood smear from 11/04/2021-marked leukocytosis, the majority the white cells are urged mononuclear cells with a "blast "like appearance.  Rare neutrophil.  Some of the mononuclear cells are bilobed.  Increased number of basophils.  The platelets appear decreased in number.  Few nucleated red cells.  BMET Recent Labs    11/04/22 0526 11/05/22 0504  NA 139 140  K 2.3* 2.5*  CL 103 106  CO2 20* 19*  GLUCOSE 102* 119*  BUN 48* 44*  CREATININE 5.31* 5.30*  CALCIUM 8.4* 8.6*    Medications: I have reviewed the patient's current medications.  Assessment/Plan:  Admission with sepsis syndrome Influenza A/pneumonia 1 blood culture positive for gram-positive cocci in the aerobic bottle only IgG kappa multiple myeloma, currently maintained off of specific therapy Peripheral blood lymphocytosis consistent with plasmacytosis Severe neutropenia, anemia, and thrombocytopenia Granix 11/01/2022 - 11/03/2022 End-stage renal disease maintained on peritoneal hemodialysis Right posterior scleritis September 2023, status post right eye  enucleation 08/28/2022 COPD Hypertension Status post splenectomy Mild right hydronephrosis/right ureter stone on CT abdomen/pelvis 10/29/2022, placement of right ureter stent 10/30/2022 Encephalopathy secondary to critical illness  [name] with sepsis syndrome related to influenza infection.  The sepsis has resolved. He has a history of multiple myeloma.  He now has severe anemia/thrombocytopenia and leukocytosis.  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  We are waiting on results from the cytometry on the peripheral blood. I discussed the poor prognosis with [name].  I reviewed his treatment history.  We discussed proceeding with treatment of the myeloma/leukemia versus comfort care.  Treatment options are complicated by his dialysis dependent renal failure.  He would like to discuss treatment options with Dr. Delton Coombes.  Recommendations: Supportive care, increase out of bed and ambulation as tolerated I will follow-up on the peripheral blood flow cytometry result and CBC from today I will communicate with Dr. Delton Coombes and recommend treatment of the myeloma/leukemia versus hospice Management of end-stage renal failure per nephrology    LOS: 7 days   Betsy Coder, MD   11/05/2022, 1:37 PM

## 2022-11-05 NOTE — Progress Notes (Incomplete)
1630: Attempted to call report to 5W, RN tied up, requesting ~38m 1703: Attempted to call report, RN not answering at this time. Will try again. 1730: Re-tried to call report, RN busy, sNetwork engineertook my number and will have her call back. 1750: Re-tried report, sNetwork engineertransferred me to RN and got sent to voicemail. Called back and asked to speak with CN, was placed on hold. 4N charge aware.

## 2022-11-05 NOTE — Progress Notes (Signed)
Date and time results received: 11/05/22 0620 (use smartphrase ".now" to insert current time)  Test: Potassium Critical Value: 2.5  Name of Provider Notified: message left with [facility] nurse who will be relaying to on call doctor  Orders Received? Or Actions Taken?: Actions Taken: elink nurse returns call and instructs me to notify nephrology

## 2022-11-05 NOTE — Progress Notes (Signed)
CSW following for further decisions on care per oncology. Therapy notes family is requesting SNF placement. If that is the chosen course of treatment, will follow up with Renal Navigator for patient to transition to hemodialysis as SNF cannot accommodate peritoneal dialysis.   Gilmore Laroche, MSW, [facility]

## 2022-11-05 NOTE — Progress Notes (Signed)
Patient transferred to room 11 from 4th floor. Patient is a/x 2. MP shows STACH. VSS> Patient has a red'pink excoriated bottom and perineum. Patient is on room air. Flu positive this admission. Septic on admission. Patient being worked up for leukemia new diagnosis. Patient in the bed oriented to room and bed. Family with patient. 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

## 2022-11-05 NOTE — Progress Notes (Signed)
[facility] Progress Note Patient Name: Ernest Barnett DOB: Mar 16, 1956 MRN: [id number]   Date of Service  11/05/2022  HPI/Events of Note  K+ 2.5 Phos 1.2 crt 5.30 Ongoing PD  eICU Interventions  Ordered [facility] 80mol Nephrology already informed and will defer further replacements to them     Intervention Category Intermediate Interventions: Electrolyte abnormality - evaluation and management  EJudd Lien1/12/2022, 6:31 AM

## 2022-11-05 NOTE — Progress Notes (Signed)
Occupational Therapy Treatment Patient Details Name: Ernest Barnett MRN: [id number] DOB: 08-Dec-1955 Today's Date: 11/05/2022   History of present illness 67 year old male admitted with AMS, transferred from [facility] on 12/26. Pt found to have ureteral stone with hydroureteronephorosis. Pt underwent R nephrostomy stent and stone removal on 12/26. PMH: morbid obesity, obesity hypoventilation syndrome, noncompliant with CPAP, COPD, end-stage renal disease on peritoneal dialysis, hypertension, hyperlipidemia, and multiple myeloma   OT comments  Pt transferred oob to chair this session total+2 max (A) with decreased control to his descend to surface. Per RN , family is now requesting SNF care as they do not feel they can manage patient at home level. Pt fatigued and resting in chair with pending new hospital bed arrival. Recommendation updated to SNF.    Recommendations for follow up therapy are one component of a multi-disciplinary discharge planning process, led by the attending physician.  Recommendations may be updated based on patient status, additional functional criteria and insurance authorization.    Follow Up Recommendations  Skilled nursing-short term rehab (<3 hours/day)     Assistance Recommended at Discharge Intermittent Supervision/Assistance  Patient can return home with the following  Two people to help with walking and/or transfers;Two people to help with bathing/dressing/bathroom   Equipment Recommendations  Wheelchair (measurements OT);Wheelchair cushion (measurements OT);BSC/3in1    Recommendations for Other Services      Precautions / Restrictions Precautions Precautions: Fall Precaution Comments: peritoneal dialysis Restrictions Weight Bearing Restrictions: No       Mobility Bed Mobility Overal bed mobility: Needs Assistance Bed Mobility: Rolling, Supine to Sit Rolling: +2 for physical assistance, Total assist   Supine to sit: +2 for physical assistance, Max  assist     General bed mobility comments: pt very sleepy on arrival. pt awakes and greets therapists. pt requires increased encouragement to engage in OOB to chair task for bed change. pt HOH and difficutly  understanding staff communication    Transfers Overall transfer level: Needs assistance   Transfers: Sit to/from Stand Sit to Stand: +2 physical assistance, +2 safety/equipment, Max assist     Step pivot transfers: +2 physical assistance, +2 safety/equipment, Max assist     General transfer comment: pt descends without control to surface. pt needs (A) to guide to chair safely     Balance Overall balance assessment: Needs assistance Sitting-balance support: Bilateral upper extremity supported, Feet supported Sitting balance-Leahy Scale: Fair     Standing balance support: Bilateral upper extremity supported, During functional activity Standing balance-Leahy Scale: Poor                             ADL either performed or assessed with clinical judgement   ADL Overall ADL's : Needs assistance/impaired                         Toilet Transfer: +2 for physical assistance;Moderate assistance;Stand-pivot;BSC/3in1 [employer] forces technical officer Details (indicate cue type and reason): simulated bed to chair                Extremity/Trunk Assessment              Vision       Perception     Praxis      Cognition Arousal/Alertness: Awake/alert Behavior During Therapy: Restless Overall Cognitive Status: Impaired/Different from baseline Area of Impairment: Awareness  Awareness: Emergent   General Comments: pt HOH and affecting understanding of what therapists are requesting        Exercises      Shoulder Instructions       General Comments pt with incontinence of stool noted on bed surface. pt increased risk for skin break down due need to (A) with transfer    Pertinent Vitals/ Pain       Pain  Assessment Pain Assessment: Faces Faces Pain Scale: Hurts even more Pain Location: kneess Pain Descriptors / Indicators: Discomfort, Grimacing, Guarding Pain Intervention(s): Monitored during session, Repositioned  Home Living                                          Prior Functioning/Environment              Frequency  Min 2X/week        Progress Toward Goals  OT Goals(current goals can now be found in the care plan section)  Progress towards OT goals: Progressing toward goals  Acute Rehab OT Goals Patient Stated Goal: to sleep OT Goal Formulation: With patient/family Time For Goal Achievement: 11/18/22 Potential to Achieve Goals: Good ADL Goals Pt Will Perform Grooming: with modified independence;sitting Pt Will Perform Upper Body Bathing: with supervision;sitting Pt Will Perform Lower Body Bathing: with mod assist;sit to/from stand Pt Will Transfer to Toilet: with mod assist;bedside commode;stand pivot transfer Additional ADL Goal #1: pt will complete bed mobility min (A) as precursor to adls.  Plan Discharge plan needs to be updated    Co-evaluation    PT/OT/SLP Co-Evaluation/Treatment: Yes Reason for Co-Treatment: Complexity of the patient's impairments (multi-system involvement);Necessary to address cognition/behavior during functional activity;For patient/therapist safety;To address functional/ADL transfers   OT goals addressed during session: ADL's and self-care;Proper use of Adaptive equipment and DME;Strengthening/ROM      AM-PAC OT "6 Clicks" Daily Activity     Outcome Measure   Help from another person eating meals?: A Little Help from another person taking care of personal grooming?: A Little Help from another person toileting, which includes using toliet, bedpan, or urinal?: A Lot Help from another person bathing (including washing, rinsing, drying)?: A Lot Help from another person to put on and taking off regular upper body  clothing?: A Little Help from another person to put on and taking off regular lower body clothing?: A Lot 6 Click Score: 15    End of Session Equipment Utilized During Treatment: Gait belt  OT Visit Diagnosis: Unsteadiness on feet (R26.81);Muscle weakness (generalized) (M62.81)   Activity Tolerance Patient tolerated treatment well   Patient Left in chair;with call bell/phone within reach;with chair alarm set   Nurse Communication Mobility status;Precautions        Time: 2094 [phone] OT Time Calculation (min): 20 min  Charges: OT General Charges $OT Visit: 1 Visit OT Treatments $Self Care/Home Management : 8-22 mins   Brynn, OTR/L  Acute Rehabilitation Services Office: [phone] .   Jeri Modena 11/05/2022, 11:02 AM

## 2022-11-06 ENCOUNTER — Encounter: Payer: Medicare HMO | Admitting: Internal Medicine

## 2022-11-06 ENCOUNTER — Inpatient Hospital Stay (HOSPITAL_COMMUNITY): Payer: Medicare HMO

## 2022-11-06 ENCOUNTER — Encounter (HOSPITAL_COMMUNITY): Payer: Self-pay | Admitting: Internal Medicine

## 2022-11-06 DIAGNOSIS — C9010 Plasma cell leukemia not having achieved remission: Secondary | ICD-10-CM

## 2022-11-06 DIAGNOSIS — D61818 Other pancytopenia: Secondary | ICD-10-CM | POA: Diagnosis not present

## 2022-11-06 DIAGNOSIS — Z992 Dependence on renal dialysis: Secondary | ICD-10-CM | POA: Diagnosis not present

## 2022-11-06 DIAGNOSIS — N186 End stage renal disease: Secondary | ICD-10-CM | POA: Diagnosis not present

## 2022-11-06 DIAGNOSIS — N138 Other obstructive and reflux uropathy: Secondary | ICD-10-CM

## 2022-11-06 DIAGNOSIS — N200 Calculus of kidney: Secondary | ICD-10-CM

## 2022-11-06 DIAGNOSIS — Z515 Encounter for palliative care: Secondary | ICD-10-CM

## 2022-11-06 DIAGNOSIS — R6520 Severe sepsis without septic shock: Secondary | ICD-10-CM

## 2022-11-06 DIAGNOSIS — J101 Influenza due to other identified influenza virus with other respiratory manifestations: Secondary | ICD-10-CM | POA: Diagnosis not present

## 2022-11-06 DIAGNOSIS — J9601 Acute respiratory failure with hypoxia: Secondary | ICD-10-CM

## 2022-11-06 LAB — BASIC METABOLIC PANEL
Anion gap: 11 (ref 5–15)
BUN: 43 mg/dL — ABNORMAL HIGH (ref 8–23)
CO2: 19 mmol/L — ABNORMAL LOW (ref 22–32)
Calcium: 8.2 mg/dL — ABNORMAL LOW (ref 8.9–10.3)
Chloride: 111 mmol/L (ref 98–111)
Creatinine, Ser: 5.22 mg/dL — ABNORMAL HIGH (ref 0.61–1.24)
GFR, Estimated: 11 mL/min — ABNORMAL LOW (ref >60)
Glucose, Bld: 115 mg/dL — ABNORMAL HIGH (ref 70–99)
Potassium: 3.6 mmol/L (ref 3.5–5.1)
Sodium: 141 mmol/L (ref 135–145)

## 2022-11-06 LAB — CBC WITH DIFFERENTIAL/PLATELET
Abs Immature Granulocytes: 2.5 10*3/uL — ABNORMAL HIGH (ref 0.00–0.07)
Basophils Absolute: 0 10*3/uL (ref 0.0–0.1)
Basophils Relative: 0 %
Eosinophils Absolute: 0 10*3/uL (ref 0.0–0.5)
Eosinophils Relative: 0 %
HCT: 19.8 % — ABNORMAL LOW (ref 39.0–52.0)
Hemoglobin: 6.3 g/dL — CL (ref 13.0–17.0)
Lymphocytes Relative: 8 %
Lymphs Abs: 9.9 10*3/uL — ABNORMAL HIGH (ref 0.7–4.0)
MCH: 32.5 pg (ref 26.0–34.0)
MCHC: 31.8 g/dL (ref 30.0–36.0)
MCV: 102.1 fL — ABNORMAL HIGH (ref 80.0–100.0)
Monocytes Absolute: 1.2 10*3/uL — ABNORMAL HIGH (ref 0.1–1.0)
Monocytes Relative: 1 %
Neutro Abs: 0 10*3/uL — CL (ref 1.7–7.7)
Neutrophils Relative %: 0 %
Other: 89 %
Platelets: 20 10*3/uL — CL (ref 150–400)
Promyelocytes Relative: 2 %
RBC: 1.94 MIL/uL — ABNORMAL LOW (ref 4.22–5.81)
RDW: 24.9 % — ABNORMAL HIGH (ref 11.5–15.5)
WBC: 123.4 10*3/uL (ref 4.0–10.5)
nRBC: 0.1 % (ref 0.0–0.2)

## 2022-11-06 LAB — KAPPA/LAMBDA LIGHT CHAINS
Kappa free light chain: 35.4 mg/L — ABNORMAL HIGH (ref 3.3–19.4)
Kappa, lambda light chain ratio: 1.59 (ref 0.26–1.65)
Lambda free light chains: 22.2 mg/L (ref 5.7–26.3)

## 2022-11-06 LAB — PATHOLOGIST SMEAR REVIEW

## 2022-11-06 LAB — URIC ACID: Uric Acid, Serum: 8.6 mg/dL (ref 3.7–8.6)

## 2022-11-06 LAB — PHOSPHORUS: Phosphorus: 2.2 mg/dL — ABNORMAL LOW (ref 2.5–4.6)

## 2022-11-06 LAB — MAGNESIUM: Magnesium: 1.8 mg/dL (ref 1.7–2.4)

## 2022-11-06 MED ORDER — POTASSIUM CHLORIDE 10 MEQ/100ML IV SOLN
10.0000 meq | INTRAVENOUS | Status: AC
  Administered 2022-11-06 (×2): 10 meq via INTRAVENOUS
  Filled 2022-11-06 (×2): qty 100

## 2022-11-06 MED ORDER — HYDROXYUREA 500 MG PO CAPS: 1000.0000 mg | ORAL_CAPSULE | Freq: Once | ORAL | Status: DC

## 2022-11-06 MED ORDER — FENTANYL CITRATE (PF) 100 MCG/2ML IJ SOLN
INTRAMUSCULAR | Status: AC | PRN
  Administered 2022-11-06 (×2): 25 ug via INTRAVENOUS

## 2022-11-06 MED ORDER — PSYLLIUM 95 % PO PACK
1.0000 | PACK | Freq: Every day | ORAL | Status: DC
  Filled 2022-11-06 (×3): qty 1

## 2022-11-06 MED ORDER — VALACYCLOVIR HCL 500 MG PO TABS
500.0000 mg | ORAL_TABLET | Freq: Every day | ORAL | Status: DC
  Filled 2022-11-06 (×3): qty 1

## 2022-11-06 MED ORDER — FLUCONAZOLE 100 MG PO TABS
100.0000 mg | ORAL_TABLET | ORAL | Status: DC
  Administered 2022-11-06: 100 mg via ORAL
  Filled 2022-11-06: qty 1

## 2022-11-06 MED ORDER — LEVOFLOXACIN 500 MG PO TABS
500.0000 mg | ORAL_TABLET | ORAL | Status: DC
  Filled 2022-11-06 (×2): qty 1

## 2022-11-06 MED ORDER — FENTANYL CITRATE (PF) 100 MCG/2ML IJ SOLN
INTRAMUSCULAR | Status: AC
  Filled 2022-11-06: qty 4

## 2022-11-06 MED ORDER — MIDAZOLAM HCL 2 MG/2ML IJ SOLN
INTRAMUSCULAR | Status: AC
  Filled 2022-11-06: qty 4

## 2022-11-06 MED ORDER — MIDAZOLAM HCL 2 MG/2ML IJ SOLN
INTRAMUSCULAR | Status: AC | PRN
  Administered 2022-11-06 (×2): .5 mg via INTRAVENOUS
  Administered 2022-11-06: 1 mg via INTRAVENOUS

## 2022-11-06 MED ORDER — MAGNESIUM SULFATE 2 GM/50ML IV SOLN
2.0000 g | Freq: Once | INTRAVENOUS | Status: AC
  Administered 2022-11-06: 2 g via INTRAVENOUS
  Filled 2022-11-06: qty 50

## 2022-11-06 MED ORDER — FLUCONAZOLE 100 MG PO TABS
100.0000 mg | ORAL_TABLET | Freq: Every day | ORAL | Status: DC
  Filled 2022-11-06: qty 1

## 2022-11-06 NOTE — Progress Notes (Addendum)
IP PROGRESS NOTE  Subjective:   Mr. Beza is alert.  His son, daughter, and significant other at the bedside.  He was transferred to [address] last night.  He received a dose of hydroxyurea last night.  He was started on allopurinol.  Objective: Vital signs in last 24 hours: Blood pressure 94/67, pulse (!) 124, temperature 99.4 F (37.4 C), temperature source Oral, resp. rate 17, weight 257 lb 11.5 oz (116.9 kg), SpO2 92 %.  Intake/Output from previous day: 01/02 0701 - 01/03 0700 In: 1192.1 [IV Piggyback:392.1] Out: -   Physical Exam:  HEENT: No thrush or bleeding  Abdomen: Nontender, no hepatosplenomegaly, right abdomen dialysis catheter site with a gauze dressing Extremities: No leg edema Neurologic: Alert, hard of hearing, cooperative, conversant Skin: Few ecchymoses over the arms   Lab Results: Recent Labs    11/05/22 1659 11/06/22 0416  WBC 125.3* 123.4*  HGB 6.6* 6.3*  HCT 19.9* 19.8*  PLT 37* 20*  Peripheral blood smear from 11/04/2021-marked leukocytosis, the majority the white cells are urged mononuclear cells with a "blast "like appearance.  Rare neutrophil.  Some of the mononuclear cells are bilobed.  Increased number of basophils.  The platelets appear decreased in number.  Few nucleated red cells.  BMET Recent Labs    11/05/22 1508 11/06/22 0416  NA 140 141  K 4.0 3.6  CL 109 111  CO2 17* 19*  GLUCOSE 106* 115*  BUN 45* 43*  CREATININE 5.30* 5.22*  CALCIUM 8.0* 8.2*    Medications: I have reviewed the patient's current medications.  Assessment/Plan:  Admission with sepsis syndrome Influenza A/pneumonia 1 blood culture positive for gram-positive cocci in the aerobic bottle only IgG kappa multiple myeloma, currently maintained off of specific therapy Peripheral blood lymphocytosis consistent with plasmacytosis Severe neutropenia, anemia, and thrombocytopenia [facility] 11/01/2022 - 11/03/2022 End-stage renal disease maintained on peritoneal  hemodialysis Right posterior scleritis September 2023, status post right eye enucleation 08/28/2022 COPD Hypertension Status post splenectomy Mild right hydronephrosis/right ureter stone on CT abdomen/pelvis 10/29/2022, placement of right ureter stent 10/30/2022 Encephalopathy secondary to critical illness Marked lymphocytosis with blast population on peripheral blood smear beginning 11/04/2022, hydroxyurea started 11/05/2022 Severe neutropenia secondary to leukemia  Mr. Minix was admitted with sepsis syndrome related to influenza infection.  The sepsis has resolved. He has a history of multiple myeloma.  He now has severe anemia/thrombocytopenia and leukocytosis.  The peripheral blood smear findings are consistent with conversion to an acute leukemic process..  Flow cytometry submitted on 10/31/2022 reveals changes consistent with AML versus plasmablastic leukemia.  I reviewed the flow cytometry and blood smear findings with Dr. Gari Crown yesterday.  He requested a fresh peripheral blood sample for flow cytometry.  I discussed the case with Dr. Delton Coombes.  He recommends proceeding with a diagnostic bone marrow biopsy.  This will be scheduled for today.     I discussed the poor prognosis with Mr. Desmith and his family.  We discussed potential treatment for AML and plasma cell leukemia.  I explained the predicted prognosis of days to weeks in the absence of treatment.   He is thinking about whether he would consider treatment versus comfort care.  He has a poor performance status at present.  He is not able to be cared for at home in his current condition.  I will contact his family when the flow cytometry result is available. He will start antiviral, antifungal, and antibiotic prophylaxis in the setting of severe neutropenia.   Recommendations: Supportive care,  increase out of bed and ambulation as tolerated, physical therapy Follow-up final flow cytometry results from today Bone marrow  biopsy Management of end-stage renal failure per nephrology Continue allopurinol Daily CBC with differential Hydroxyurea today Start antibiotic and antifungal prophylaxis with the severe neutropenia    LOS: 8 days   Betsy Coder, MD   11/06/2022, 12:53 PM

## 2022-11-06 NOTE — Progress Notes (Signed)
Chart reviewed for LDCT follow-up need, patient lung cancer screening referral closed due to complexity of patient conditions at this time. Referring provider aware.

## 2022-11-06 NOTE — Consult Note (Signed)
Consultation Note Date: 11/06/2022 at 1500  Patient Name: Ernest Barnett  DOB: Jul 31, 1956  MRN: [id number]  Age / Sex: 67 y.o., male  PCP: Lindell Spar, MD Referring Physician: Geradine Girt, DO  Reason for Consultation: Establishing goals of care  HPI/Patient Profile: 67 y.o. male  with past medical history of morbid obesity, obesity hypoventilation syndrome, ESRD (PD), HTN, noncompliant with CPAP, and HLD admitted to [facility] ED on 10/29/2022 with AMS.  In [facility] ED, patient noted to have fever of 103F and 4 mm right ureteral stone with hydroureteronephrosis.  Patient was transferred to [facility] for further evaluation.  Patient also found to be positive for influenza A.  Patient is being treated for severe sepsis due to complicated UTI in the setting of obstructive uropathy and acute septic encephalopathy.  Patient also has history of multiple myeloma and now has severe anemia, thrombocytopenia, and leukocytosis.  Dr. Benay Spice is following and bone marrow biopsy performed today.Dr. Delton Coombes also following and has requested fresh peripheral blood sample for flow cytometry.   PMT was consulted to discuss goals of care.  Clinical Assessment and Goals of Care: I have reviewed medical records including EPIC notes, labs and imaging, I assessed the patient at bedside.  He was completely naked without linens at bedside.  I provided a sheet.  He was able to acknowledge my presence but is unable to make his wishes known to me today.  Patient is unable to participate in goals of care or complex decision-making discussions independently at this time.  Thus, I spoke with patient's significant other [name] over the phone to discuss diagnosis prognosis, [place], EOL wishes, disposition and options.   I introduced Palliative Medicine as specialized medical care for people living with serious illness. It focuses on  providing relief from the symptoms and stress of a serious illness. The goal is to improve quality of life for both the patient and the family.  We discussed a brief life review of the patient. [name] and Ernest Barnett have been friend and living together for 26 years. [name] has been helping take care of Ernest Barnett but they are not married.  As far as functional and nutritional status [name] shares patient has been declining over the last several months. She shares he is stubborn and would not admit needing help with ADLs. However, she shares he has recently said he "didn't think it would turn out this way" and "feels old" despite being 86.   We discussed patient's current illness and what it means in the larger context of patient's on-going co-morbidities. Education on bone marrow biopsy, AML and plasma cell leukemia discussed. [name] is familiar with plasma cell leukemia as her sister is currently battling it. Natural disease trajectory discussed.  I attempted to elicit values and goals of care important to the patient. Advance directives, concepts specific to code status, artificial feeding and hydration, and rehospitalization were considered and discussed. [name] shares that the patient has shared with her previously that he did not see his life  playing out this way.  However, he has been resistant in the past to multiple attempts at creating a living will or discussing advance care planning.  Education offered regarding concept specific to human mortality and the limitations of medical interventions to prolong life when the body begins to fail to thrive.  Family is facing treatment option decisions, advanced directive, and anticipatory care needs.    Discussed with [name] the importance of continued conversation with family and the medical providers regarding overall plan of care and treatment options, ensuring decisions are within the context of the patient's values and GOCs.    [name] shares she is communicating  with patient's 2 children.  She plans to let them know that Dr. Rondel Oh expects him to be bedside between 6 and 6:30 AM tomorrow in order to discuss results of biopsy.  I shared that I plan to be bedside at 9 AM to continue goals of care discussions with patient, [name], and any other family that is available at that time.  Questions and concerns were addressed.  PMT to continue to follow.  Primary Decision Maker NEXT OF KIN  Physical Exam Constitutional:      General: He is not in acute distress.    Appearance: He is obese. He is not ill-appearing.  HENT:     Head: Normocephalic.     Mouth/Throat:     Mouth: Mucous membranes are moist.  Cardiovascular:     Rate and Rhythm: Tachycardia present.     Pulses: Normal pulses.  Pulmonary:     Effort: Pulmonary effort is normal.  Abdominal:     Palpations: Abdomen is soft.  Musculoskeletal:     Comments: Generalized weakness  Neurological:     Mental Status: He is alert.     Comments: Oriented to self     Palliative Assessment/Data: 30%     Thank you for this consult. Palliative medicine will continue to follow and assist holistically.   Time Total: 75 minutes Greater than 50%  of this time was spent counseling and coordinating care related to the above assessment and plan.  Signed by: Jordan Hawks, DNP, FNP-BC Palliative Medicine    Please contact Palliative Medicine Team phone at [phone] for questions and concerns.  For individual provider: See Shea Evans

## 2022-11-06 NOTE — Consult Note (Addendum)
Chief Complaint: Severe anemia/thrombocytopenia and leukocytosis.   Referring Physician(s): Betsy Coder  Supervising Physician: Sandi Mariscal  Patient Status: [facility] - In-pt  History of Present Illness: Ernest Barnett is a 67 y.o. male with medical issues including CKD on peritoneal dialysis, COPD, CVA, HTN, history of stroke, and history of multiple myeloma.  He presented to the hospital on 10/29/22 with altered mental status.  He was found to have sepsis syndrome due to influenza which has resolved but he has severe anemia/thrombocytopenia and leukocytosis.  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   We are asked to perform a bone marrow biopsy.  He is NPO.   Past Medical History:  Diagnosis Date   Chronic kidney disease    COPD (chronic obstructive pulmonary disease) (HCC)    CVA (cerebral vascular accident) ([place]) 2013   Cystoid macular edema of left eye 03/07/2021   GERD (gastroesophageal reflux disease)    HOH (hard of hearing)    Hypercholesteremia    Hypertension    Left epiretinal membrane 03/07/2021   Pseudophakia 03/07/2021   Stroke ([place]) 03/24/2012   left sided weakness   Vitamin D deficiency     Past Surgical History:  Procedure Laterality Date   A/V FISTULAGRAM Right 01/29/2022   Procedure: A/V Fistulagram;  Surgeon: Serafina Mitchell, MD;  Location: [place] [facility];  Service: Cardiovascular;  Laterality: Right;   AV FISTULA PLACEMENT Right 02/08/2021   Procedure: RIGHT ARM ARTERIOVENOUS (AV) FISTULA CREATION;  Surgeon: [name], MD;  Location: [facility];  Service: Vascular;  Laterality: Right;   BIOPSY  11/12/2018   Procedure: BIOPSY;  Surgeon: Rogene Houston, MD;  [facility];  Service: Endoscopy;;  colon   BIOPSY  05/10/2020   Procedure: BIOPSY;  Surgeon: Rogene Houston, MD;  [facility];  Service: Endoscopy;;  esophagus   CATARACT  EXTRACTION     right eye   CATARACT EXTRACTION W/PHACO  10/08/2012   Procedure: CATARACT EXTRACTION PHACO AND INTRAOCULAR LENS PLACEMENT ([place]);  Surgeon: Tonny Branch, MD;  Location: [facility];  Service: Ophthalmology;  Laterality: Left;  CDE:6.64   CHOLECYSTECTOMY     [place]   COLONOSCOPY N/A 11/12/2018   Procedure: COLONOSCOPY;  Surgeon: Rogene Houston, MD;  [facility];  Service: Endoscopy;  Laterality: N/A;  [place], URETEROSCOPY AND STENT PLACEMENT Right 10/29/2022   Procedure: CYSTOSCOPY , URETEROSCOPY AND STENT PLACEMENT RIGHT;  Surgeon: Ardis Hughs, MD;  Location: [place];  Service: Urology;  Laterality: Right;   ELBOW FRACTURE SURGERY     left   ESOPHAGEAL DILATION N/A 11/12/2018   Procedure: ESOPHAGEAL DILATION;  Surgeon: Rogene Houston, MD;  [facility];  Service: Endoscopy;  Laterality: N/A;   ESOPHAGEAL DILATION N/A 05/10/2020   Procedure: ESOPHAGEAL DILATION;  Surgeon: Rogene Houston, MD;  [facility];  Service: Endoscopy;  Laterality: N/A;   ESOPHAGOGASTRODUODENOSCOPY N/A 11/12/2018   Procedure: ESOPHAGOGASTRODUODENOSCOPY (EGD);  Surgeon: Rogene Houston, MD;  [facility];  Service: Endoscopy;  Laterality: N/A;   ESOPHAGOGASTRODUODENOSCOPY N/A 05/10/2020   Procedure: ESOPHAGOGASTRODUODENOSCOPY (EGD);  Surgeon: Rogene Houston, MD;  [facility];  Service: Endoscopy;  Laterality: N/A;  210   HERNIA REPAIR     right inguinal   HYDROCELE EXCISION / REPAIR     IR FLUORO GUIDE  CV LINE LEFT  08/17/2021   IR US GUIDE VASC ACCESS LEFT  08/17/2021   LIGATION OF COMPETING BRANCHES OF ARTERIOVENOUS FISTULA Right 09/11/2021   Procedure: LIGATION OF COMPETING BRANCHES OF RIGHT ARM ARTERIOVENOUS FISTULA;  Surgeon: [name], MD;  Location: [facility];  Service: Vascular;  Laterality: Right;   PERIPHERAL VASCULAR BALLOON ANGIOPLASTY Right 01/29/2022   Procedure: PERIPHERAL VASCULAR BALLOON ANGIOPLASTY;   Surgeon: Serafina Mitchell, MD;  Location: [place] [facility];  Service: Cardiovascular;  Laterality: Right;  arm fistula   POLYPECTOMY  11/12/2018   Procedure: POLYPECTOMY;  Surgeon: Rogene Houston, MD;  [facility];  Service: Endoscopy;;  colon    RETINAL DETACHMENT SURGERY Left 2019   SPLENECTOMY, TOTAL     TONSILLECTOMY     VOCAL CORD INJECTION     removal of polyp-2005    Allergies: Patient has no known allergies.  Medications: Prior to Admission medications   Medication Sig Start Date End Date Taking? Authorizing Provider  acetaminophen-codeine (TYLENOL #3) 300-30 MG tablet Take 2 tablets by mouth every 8 (eight) hours as needed. 08/12/22  Yes [provider]  acyclovir (ZOVIRAX) 200 MG capsule Take 200 mg by mouth 2 (two) times daily. 08/27/22  Yes [provider]  albuterol (VENTOLIN HFA) 108 (90 Base) MCG/ACT inhaler Inhale 2 puffs into the lungs every 6 (six) hours as needed for wheezing or shortness of breath. 05/09/22  Yes Lindell Spar, MD  aspirin EC 81 MG tablet Take 1 tablet (81 mg total) by mouth daily. 05/13/20  Yes Rehman, Mechele Dawley, MD  bortezomib IV (VELCADE) 3.5 MG injection Inject 1.5 mg/m2 into the vein once a week. 03/14/21  Yes [provider]  budesonide-formoterol (SYMBICORT) 80-4.5 MCG/ACT inhaler Inhale 2 puffs into the lungs 2 (two) times daily as needed (shortness of breath). 05/09/22  Yes Lindell Spar, MD  cholecalciferol (VITAMIN D3) 25 MCG (1000 UNIT) tablet Take 1,000 Units by mouth daily.   Yes [provider]  cyanocobalamin (VITAMIN B12) 1000 MCG tablet Take 1,000 mcg by mouth daily.   Yes [provider]  erythromycin ophthalmic ointment Place 1 Application into the right eye 3 (three) times daily. 09/03/22  Yes [provider]  gabapentin (NEURONTIN) 300 MG capsule Take 1 capsule by mouth twice daily 02/25/22  Yes Derek Jack, MD  multivitamin (RENA-VIT) TABS tablet Take 1 tablet  by mouth daily. 05/13/22  Yes [provider]  pantoprazole (PROTONIX) 40 MG tablet Take 1 tablet (40 mg total) by mouth daily. 05/09/22  Yes Lindell Spar, MD  potassium chloride SA (KLOR-CON M) 20 MEQ tablet Take 1 tablet (20 mEq total) by mouth 2 (two) times daily. 10/22/22  Yes BranchAlphonse Guild, MD  pravastatin (PRAVACHOL) 80 MG tablet Take 1 tablet (80 mg total) by mouth daily. 06/25/21  Yes Noreene Larsson, NP  tamsulosin [facility]) 0.4 MG CAPS capsule Take 1 capsule by mouth once daily 10/17/22  Yes Lindell Spar, MD  tiZANidine (ZANAFLEX) 2 MG tablet TAKE 1 TABLET BY MOUTH EVERY 6 HOURS AS NEEDED FOR MUSCLE SPASM 11/01/21  Yes Noreene Larsson, NP  torsemide (DEMADEX) 100 MG tablet Take 100 mg by mouth 2 (two) times daily. 07/27/21  Yes [provider]  dexamethasone (DECADRON) 4 MG tablet Take 2.5 tablets (10 mg total) by mouth once a week. Take at home along with Tylenol and Benadryl weekly on the day of daratumumab injection. Patient not taking: Reported on 10/29/2022  10/04/21   Derek Jack, MD  oxyCODONE (OXY IR/ROXICODONE) 5 MG immediate release tablet Take 1 tablet (5 mg total) by mouth 2 (two) times daily as needed for severe pain. Patient not taking: Reported on 10/29/2022 09/11/21   Derek Jack, MD  polyethylene glycol [facility] Floria Raveling) packet Take 17 g by mouth daily. Patient states that he takes as needed. Patient not taking: Reported on 10/29/2022 03/31/18   Rogene Houston, MD  senna (SENOKOT) 8.6 MG TABS tablet Take 2 tablets (17.2 mg total) by mouth daily. Patient taking differently: Take 1 tablet by mouth daily. 08/19/22   Derek Jack, MD  sildenafil (REVATIO) 20 MG tablet Take 1 tablet (20 mg total) by mouth 5 (five) times daily as needed. Patient not taking: Reported on 10/29/2022 10/23/21   Noreene Larsson, NP     History reviewed. No pertinent family history.  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  Tobacco Use   Smoking status: Former    Packs/day: 3.00    Years: 30.00    Total pack years: 90.00    Types: Cigarettes   Smokeless tobacco: Current   Tobacco comments:    Currently vape  Vaping Use   Vaping Use: Every day   Substances: Nicotine  Substance and Sexual Activity   Alcohol use: Not Currently    Alcohol/week: 1.0 - 2.0 standard drink of alcohol    Types: 1 - 2 Cans of beer per week    Comment: Occasionally   Drug use: No   Sexual activity: Not Currently    Birth control/protection: Abstinence, None  Other Topics Concern   Not on file  Social History Narrative   2 children, both nearby   Social Determinants of Health   Financial Resource Strain: Low Risk  (05/30/2022)   Overall Financial Resource Strain (CARDIA)    Difficulty of Paying Living Expenses: Not hard at all  Food Insecurity: No Food Insecurity (05/30/2022)   Hunger Vital Sign    Worried About Running Out of Food in the Last Year: Never true    Ran Out of Food in the Last Year: Never true  Transportation Needs: No Transportation Needs (05/30/2022)   PRAPARE - Hydrologist (Medical): No    Lack of Transportation (Non-Medical): No  Physical Activity: Unknown (05/30/2022)   Exercise Vital Sign    Days of Exercise per Week: Not on file    Minutes of Exercise per Session: 0 min  Stress: No Stress Concern Present (05/30/2022)   [place]    Feeling of Stress : Not at all  Social Connections: Socially Isolated (05/30/2022)   Social Connection and Isolation Panel [NHANES]    Frequency of Communication with Friends and Family: More than three times a week    Frequency of  Social Gatherings with Friends and Family: More than three times a week    Attends Religious Services: Never    Marine scientist or Organizations: No    Attends Archivist Meetings: Never    Marital Status: Divorced     Review of Systems  Unable to perform ROS:  Other  Patient very sleepy this morning. Arousable but falls right back to sleep.  Vital Signs: BP (!) 82/73 (BP Location: Left Arm)   Pulse 78   Temp 99.5 F (37.5 C) (Oral)   Resp 17   Wt 257 lb 11.5 oz (116.9 kg)   SpO2 94%   BMI 40.97 kg/m   Physical Exam Vitals reviewed.  Constitutional:      Appearance: Normal appearance.  HENT:     Head: Normocephalic and atraumatic.  Cardiovascular:     Rate and Rhythm: Normal rate and regular rhythm.  Pulmonary:     Effort: Pulmonary effort is normal. No respiratory distress.     Breath sounds: Normal breath sounds.  Abdominal:     General: There is no distension.     Palpations: Abdomen is soft.     Tenderness: There is no abdominal tenderness.  Musculoskeletal:        General: Normal range of motion.     Cervical back: Normal range of motion.  Skin:    General: Skin is warm and dry.  Neurological:     General: No focal deficit present.     Mental Status: He is alert and oriented to person, place, and time.  Psychiatric:     Comments: Very sleepy this morning. Arousable but falls right back to sleep.     Imaging: EEG adult  Result Date: 10/31/2022 [name], MD     10/31/2022 10:47 AM Patient Name: LUCIOUS ZOU MRN: [id number] Epilepsy Attending: [name] Referring Physician/Provider: Juanito Doom, MD Date: 10/31/2022 Duration: 22.37 mins Patient history: 67 year old male with altered mental status.  EEG to evaluate for seizure. Level of alertness: Awake, drowsy AEDs during EEG study: None Technical aspects: This EEG study was done with scalp electrodes positioned according to the 10-20 International system of electrode  placement. Electrical activity was reviewed with band pass filter of 1-_0 , sensitivity of 7 uV/mm, display speed of 73m/sec with a _1  notched filter applied as appropriate. EEG data were recorded continuously and digitally stored.  Video monitoring was available and reviewed as appropriate. Description: The posterior dominant rhythm consists of 8 Hz activity of moderate voltage (25-35 uV) seen predominantly in posterior head regions, symmetric and reactive to eye opening and eye closing. Drowsiness was characterized by attenuation of the posterior background rhythm.  EEG showed no regional 3 to 7 Hz theta- delta slowing. Hyperventilation and photic stimulation were not performed.   ABNORMALITY - Intermittent slow, generalized IMPRESSION: This study is suggestive of mild diffuse encephalopathy, nonspecific etiology. No seizures or epileptiform discharges were seen throughout the recording. PLora Havens  DG CHEST PORT 1 VIEW  Result Date: 10/30/2022 CLINICAL DATA:  Acute respiratory failure with hypoxemia. EXAM: PORTABLE CHEST 1 VIEW COMPARISON:  Radiographs 10/29/2022, PET-CT 03/08/2021 and chest CT 02/15/2021. FINDINGS: 1039 hours. Lordotic positioning. The heart size and mediastinal contours are stable with aortic atherosclerosis. There is new focal airspace disease in the right infrahilar region which may be within the middle or lower lobe. The left lung is clear. No pleural effusion or pneumothorax. No acute osseous findings are evident. Telemetry leads overlie the chest. IMPRESSION: New right infrahilar airspace disease suspicious for pneumonia. Followup PA and lateral chest X-ray is recommended in 3-4 weeks following trial of antibiotic therapy to ensure resolution and exclude underlying malignancy. Electronically Signed   By: WRichardean SaleM.D.   On: 10/30/2022 11:14   DG C-Arm 1-60 Min  Result Date: 10/30/2022  CLINICAL DATA:  Stent placement EXAM: DG C-ARM 1-60 MIN FLUOROSCOPY:  Fluoroscopy Time:  20.6 Radiation Exposure Index (if provided by the fluoroscopic device): 12.2 mGy Number of Acquired Spot Images: 2 FINDINGS: Multiple intraoperative fluoroscopic spot images are provided without a radiologist present. IMPRESSION: Intraoperative fluoroscopic images during stent placement. Electronically Signed   By: Placido Sou M.D.   On: 10/30/2022 00:57   CT HEAD WO CONTRAST (5MM)  Result Date: 10/29/2022 CLINICAL DATA:  Head trauma, minor. Mental status changes. Combative. EXAM: CT HEAD WITHOUT CONTRAST TECHNIQUE: Contiguous axial images were obtained from the base of the skull through the vertex without intravenous contrast. RADIATION DOSE REDUCTION: This exam was performed according to the departmental dose-optimization program which includes automated exposure control, adjustment of the mA and/or kV according to patient size and/or use of iterative reconstruction technique. COMPARISON:  07/09/2022 FINDINGS: Brain: The study suffers from motion degradation. There is generalized brain atrophy. There chronic small-vessel ischemic changes throughout the cerebral hemispheric white matter. There is an old right frontal cortical and subcortical infarction. No mass, hemorrhage, hydrocephalus or extra-axial collection. Vascular: There is atherosclerotic calcification of the major vessels at the base of the brain. Skull: Negative Sinuses/Orbits: No significant sinus disease. Globe prosthesis on the right. Other: None IMPRESSION: No acute or traumatic finding. Atrophy and chronic small-vessel ischemic changes of the white matter. 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 Electronically Signed   By: Nelson Chimes M.D.   On: 10/29/2022 15:09   CT ABDOMEN PELVIS WO CONTRAST  Result Date: 10/29/2022 CLINICAL DATA:  Altered mental status and sepsis EXAM: CT ABDOMEN AND PELVIS WITHOUT CONTRAST TECHNIQUE: Multidetector CT imaging of the abdomen and pelvis was performed following the  standard protocol without IV contrast. RADIATION DOSE REDUCTION: This exam was performed according to the departmental dose-optimization program which includes automated exposure control, adjustment of the mA and/or kV according to patient size and/or use of iterative reconstruction technique. COMPARISON:  None Available. FINDINGS: Lower chest: Technically challenging examination due to patient motion artifact. Bilateral lower lobe ground-glass opacities. No pleural effusion or pneumothorax demonstrated. Partially imaged heart size is normal. Coronary artery calcifications. Hepatobiliary: No focal hepatic lesions. No intra or extrahepatic biliary ductal dilation. Cholecystectomy. Pancreas: No focal lesions or main ductal dilation. Spleen: Surgically absent. Adrenals/Urinary Tract: No adrenal nodules. No suspicious renal masses. Mild right hydronephrosis to the level of a 4 mm stone in the distal ureter (3:78). Bilateral simple-appearing cysts. No focal bladder wall thickening. Stomach/Bowel: Small hiatal hernia. Normal appearance of the stomach. No evidence of bowel wall thickening, distention, or inflammatory changes. The appendix is not discretely seen. Vascular/Lymphatic: Aortic atherosclerosis. No enlarged abdominal or pelvic lymph nodes. Reproductive: Prostate is unremarkable. Other: Peritoneal dialysis catheter present in the lower abdomen. No free air or free fluid. No fluid collections. Musculoskeletal: No acute or abnormal lytic or blastic osseous lesions. 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 IMPRESSION: 1. Challenging examination due to patient motion artifact. 2. Mild right hydronephrosis to the level of a 4 mm stone in the distal ureter. 3. Bilateral lower lobe ground-glass opacities, likely infectious/inflammatory. 4. Coronary artery calcification. Aortic Atherosclerosis (ICD10-I70.0). Electronically Signed   By: Darrin Nipper M.D.   On: 10/29/2022 14:43   DG  Chest Portable 1 View  Result Date: 10/29/2022 CLINICAL DATA:  Fever.  Sepsis. EXAM: PORTABLE CHEST 1 VIEW COMPARISON:  CT 02/15/2021 FINDINGS: Cardiomegaly. Lordotic positioning. Upper lungs are clear. No lower lobe dense consolidation or collapse. Cannot  rule out minimal patchy density at the lung bases. No pleural effusion. No bone finding. IMPRESSION: Cardiomegaly. Lordotic positioning. Cannot rule out minimal patchy density at the lung bases. No dense consolidation or collapse. Electronically Signed   By: Nelson Chimes M.D.   On: 10/29/2022 10:55    Labs:  CBC: Recent Labs    11/03/22 0531 11/04/22 0526 11/05/22 1659 11/06/22 0416  WBC 40.3*  42.7* 113.0* 125.3* 123.4*  HGB 7.9*  7.9* 7.2* 6.6* 6.3*  HCT 23.8*  24.4* 21.4* 19.9* 19.8*  PLT 24*  26* 39* 37* 20*    COAGS: Recent Labs    07/07/22 1851 10/29/22 2120 10/30/22 1723  INR 1.0 1.3* 1.4*  APTT 26  --  42*    BMP: Recent Labs    11/04/22 0526 11/05/22 0504 11/05/22 1508 11/06/22 0416  NA 139 140 140 141  K 2.3* 2.5* 4.0 3.6  CL 103 106 109 111  CO2 20* 19* 17* 19*  GLUCOSE 102* 119* 106* 115*  BUN 48* 44* 45* 43*  CALCIUM 8.4* 8.6* 8.0* 8.2*  CREATININE 5.31* 5.30* 5.30* 5.22*  GFRNONAA 11* 11* 11* 11*    LIVER FUNCTION TESTS: Recent Labs    10/01/22 0953 10/15/22 1251 10/29/22 1057 11/03/22 0949 11/05/22 0504 11/05/22 1508  BILITOT 0.4 0.7 0.6 0.5  --   --   AST 18 12* 22 48*  --   --   ALT _0 44  --   --   ALKPHOS 40 37* 28* 28*  --   --   PROT 6.5 6.9 6.6 5.9*  --   --   ALBUMIN 3.3* 3.6 3.2* 2.3* 2.5* 2.4*    TUMOR MARKERS: No results for input(s): "AFPTM", "CEA", "CA199", "CHROMGRNA" in the last 8760 hours.  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   Will proceed with image guided bone marrow biopsy today by Dr.  Pascal Lux.  Risks and benefits of bone marrow biopsy was discussed with the patient and/or patient's family including, but not limited to bleeding, infection, damage to adjacent structures or low yield requiring additional tests.  All of the questions were answered and there is agreement to proceed.  Consent signed and in chart.  Thank you for allowing our service to participate in Ernest Barnett 's care.  Electronically Signed: Murrell Redden, PA-C   11/06/2022, 8:50 AM      I spent a total of 40 Minutes  in face to face in clinical consultation, greater than 50% of which was counseling/coordinating care for bone marrow biopsy.

## 2022-11-06 NOTE — Progress Notes (Signed)
Patient back from IR. Patient had a bone marrow biopsy. Bandaid dry and intact to lower back. Patient awake and alert.

## 2022-11-06 NOTE — Progress Notes (Addendum)
PROGRESS NOTE    Ernest Barnett  VOH:[id number] DOB: 1956-08-14 DOA: 10/29/2022 PCP: Lindell Spar, MD    Brief Narrative:  67 y/o male with multiple medical problems presented to [facility] with abdominal pain and fever to 103. Found to be FLU A positive. Also noted to have 11mmR ureteral stone with hydroureteronephrosis. Moved to the ICU for sepsis, intubated and take to the OR for R nephrostomy stent and stone removal.   12/26 admitted, to OR with urology for cystoscopy and right ureteral stent; GPC 1/4 bottles 12/27 extubated, confused, found to have RLL infiltrate, on BIPAP and HHF O2 and precedex.  Peritoneal fluid analysis showed 12 WBC. Changed from ceftriaxone to zosyn.  12/28 Agitation improved yesterday  Had worsening shock and work of breathing last night peripheral smear: acute plasma cell leukemia Hematology consult: could consider G-CSF and salvage chemo if he survives this illness 1/2 [place] [facility] and palliative care consult   Assessment and Plan: Severe sepsis due to: right lower lobe aspiration pneumonia, possibly obstructive pyelo though CT did not show stranding around kidney GPC 1/4 BC likely contaminant Zosyn stopped 1/1 Monitor cultures   Influenza A -droplet precautions  Acute metabolic encephalopathy with combativeness > related to prior frontal lobe stroke and acute infection (has had this before) ICU delirium> severe intermittent agitation, improved 12/31 Seroquel -delirium precautions  Precedex wean off Out of bed if able PT/OT-SNF   ESRD on PD Hypokalemia Ureteral stone s/p R uretal stent PD per renal  Monitor BMET and UOP Replace electrolytes per renal   Acute on chronic macrocytic anemia with severe thrombocytopenia Multiple myeloma Acute plasma cell leukemia, poor prognosis Neutropenia Transfuse PRBC for Hgb < 7 gm/dL- currently refusing F/u with hematology on 1/2; flow cytometry pending -s/p BMB --refusing hydroxyurea Continue  supportive care   Nausea -PRN zofran ? Ileus developing -liquid diet -xray with air filled loops of bowel  Obesity Estimated body mass index is 40.97 kg/m as calculated from the following:   Height as of 10/22/22: 5' 6.5" (1.689 m).   Weight as of this encounter: 116.9 kg.     Poor overall prognosis- discussed with wife need for palliative care consult as patient is refusing most interventions  DVT prophylaxis: SCDs Start: 10/29/22 1947    Code Status: DNR Family Communication: wife at bedside  Disposition Plan:  Level of care: Progressive Status is: Inpatient Remains inpatient appropriate because: sick    Consultants:  PCCM Oncology Renal Palliative care   Subjective: Justback from BMB-- agreeable for transfusion for me but refused the RN   Objective: Vitals:   11/06/22 1020 11/06/22 1043 11/06/22 1100 11/06/22 1200  BP: (!) 91/53 94/67    Pulse: (!) 122 (!) 124    Resp: _0 Temp:  99.4 F (37.4 C)  99.4 F (37.4 C)  TempSrc:  Oral  Oral  SpO2: 92%     Weight:        Intake/Output Summary (Last 24 hours) at 11/06/2022 1235 Last data filed at 11/06/2022 1059 Gross per 24 hour  Intake --  Output 206 ml  Net -206 ml   Filed Weights   10/30/22 0130  Weight: 116.9 kg    Examination:    General: Appearance:    Severely obese male in no acute distress     Lungs:     respirations unlabored  Heart:    Tachycardic.    MS:   All extremities are intact.  Neurologic:   Awake, confused       Data Reviewed: I have personally reviewed following labs and imaging studies  CBC: Recent Labs  Lab 11/01/22 0438 11/02/22 0530 11/03/22 0531 11/04/22 0526 11/05/22 1659 11/06/22 0416  WBC 16.1* 25.2* 40.3*  42.7* 113.0* 125.3* 123.4*  NEUTROABS 0.2*  --  3.2 1.1* 18.0* 0.0*  HGB 7.6* 7.1* 7.9*  7.9* 7.2* 6.6* 6.3*  HCT 22.4* 21.6* 23.8*  24.4* 21.4* 19.9* 19.8*  MCV 100.4* 100.9* 102.1*  103.0* 101.4* 100.0 102.1*  PLT 33* 30* 24*  26*  39* 37* 20*   Basic Metabolic Panel: Recent Labs  Lab 10/31/22 0534 11/01/22 0438 11/03/22 0949 11/04/22 0526 11/05/22 0504 11/05/22 1508 11/06/22 0416  NA 143   < > 139 139 140 140 141  K 3.5   < > 3.5 2.3* 2.5* 4.0 3.6  CL 109   < > 109 103 106 109 111  CO2 23   < > 22 20* 19* 17* 19*  GLUCOSE 146*   < > 132* 102* 119* 106* 115*  BUN 47*   < > 39* 48* 44* 45* 43*  CREATININE 6.89*   < > 5.07* 5.31* 5.30* 5.30* 5.22*  CALCIUM 8.2*   < > 8.4* 8.4* 8.6* 8.0* 8.2*  MG 2.0  --   --   --  1.7  --  1.8  PHOS 5.3*  --   --   --  1.2* 2.3* 2.2*   < > = values in this interval not displayed.   GFR: Estimated Creatinine Clearance: 17.1 mL/min (A) (by C-G formula based on SCr of 5.22 mg/dL (H)). Liver Function Tests: Recent Labs  Lab 11/03/22 0949 11/05/22 0504 11/05/22 1508  AST 48*  --   --   ALT 44  --   --   ALKPHOS 28*  --   --   BILITOT 0.5  --   --   PROT 5.9*  --   --   ALBUMIN 2.3* 2.5* 2.4*   No results for input(s): "LIPASE", "AMYLASE" in the last 168 hours. No results for input(s): "AMMONIA" in the last 168 hours. Coagulation Profile: Recent Labs  Lab 10/30/22 1723  INR 1.4*   Cardiac Enzymes: No results for input(s): "CKTOTAL", "CKMB", "CKMBINDEX", "TROPONINI" in the last 168 hours. BNP (last 3 results) No results for input(s): "PROBNP" in the last 8760 hours. HbA1C: No results for input(s): "HGBA1C" in the last 72 hours. CBG: Recent Labs  Lab 11/01/22 0844  GLUCAP 157*   Lipid Profile: No results for input(s): "CHOL", "HDL", "LDLCALC", "TRIG", "CHOLHDL", "LDLDIRECT" in the last 72 hours. Thyroid Function Tests: No results for input(s): "TSH", "T4TOTAL", "FREET4", "T3FREE", "THYROIDAB" in the last 72 hours. Anemia Panel: No results for input(s): "VITAMINB12", "FOLATE", "FERRITIN", "TIBC", "IRON", "RETICCTPCT" in the last 72 hours. Sepsis Labs: No results for input(s): "PROCALCITON", "LATICACIDVEN" in the last 168 hours.   Recent Results (from  the past 240 hour(s))  Resp panel by RT-PCR (RSV, Flu A&B, Covid) Anterior Nasal Swab     Status: Abnormal   Collection Time: 10/29/22 10:24 AM   Specimen: Anterior Nasal Swab  Result Value Ref Range Status   SARS Coronavirus 2 by RT PCR NEGATIVE NEGATIVE Final    Comment: (NOTE) SARS-CoV-2 target nucleic acids are NOT DETECTED.  The SARS-CoV-2 RNA is generally detectable in upper respiratory specimens during the acute phase of infection. The lowest concentration of SARS-CoV-2 viral copies this assay can detect is 138 copies/mL. A negative  result does not preclude SARS-Cov-2 infection and should not be used as the sole basis for treatment or other patient management decisions. A negative result may occur with  improper specimen collection/handling, submission of specimen other than nasopharyngeal swab, presence of viral mutation(s) within the areas targeted by this assay, and inadequate number of viral copies(<138 copies/mL). A negative result must be combined with clinical observations, patient history, and epidemiological information. The expected result is Negative.  Fact Sheet for Patients:  EntrepreneurPulse.com.au  Fact Sheet for Healthcare Providers:  IncredibleEmployment.be  This test is no t yet approved or cleared by the [place] FDA and  has been authorized for detection and/or diagnosis of SARS-CoV-2 by FDA under an Emergency Use Authorization (EUA). This EUA will remain  in effect (meaning this test can be used) for the duration of the COVID-19 declaration under Section 564(b)(1) of the Act, 21 U.S.C.section 360bbb-3(b)(1), unless the authorization is terminated  or revoked sooner.       Influenza A by PCR POSITIVE (A) NEGATIVE Final   Influenza B by PCR NEGATIVE NEGATIVE Final    Comment: (NOTE) The Xpert Xpress SARS-CoV-2/FLU/RSV plus assay is intended as an aid in the diagnosis of influenza from Nasopharyngeal swab  specimens and should not be used as a sole basis for treatment. Nasal washings and aspirates are unacceptable for Xpert Xpress SARS-CoV-2/FLU/RSV testing.  Fact Sheet for Patients: EntrepreneurPulse.com.au  Fact Sheet for Healthcare Providers: IncredibleEmployment.be  This test is not yet approved or cleared by the [place] FDA and has been authorized for detection and/or diagnosis of SARS-CoV-2 by FDA under an Emergency Use Authorization (EUA). This EUA will remain in effect (meaning this test can be used) for the duration of the COVID-19 declaration under Section 564(b)(1) of the Act, 21 U.S.C. section 360bbb-3(b)(1), unless the authorization is terminated or revoked.     Resp Syncytial Virus by PCR NEGATIVE NEGATIVE Final    Comment: (NOTE) Fact Sheet for Patients: EntrepreneurPulse.com.au  Fact Sheet for Healthcare Providers: IncredibleEmployment.be  This test is not yet approved or cleared by the [place] FDA and has been authorized for detection and/or diagnosis of SARS-CoV-2 by FDA under an Emergency Use Authorization (EUA). This EUA will remain in effect (meaning this test can be used) for the duration of the COVID-19 declaration under Section 564(b)(1) of the Act, 21 U.S.C. section 360bbb-3(b)(1), unless the authorization is terminated or revoked.  Performed at [facility], [address], [place], [place] [address]   Blood culture (routine x 2)     Status: None   Collection Time: 10/29/22 10:25 AM   Specimen: BLOOD  Result Value Ref Range Status   Specimen Description BLOOD LEFT ARM  Final   Special Requests   Final    BOTTLES DRAWN AEROBIC AND ANAEROBIC Blood Culture results may not be optimal due to an inadequate volume of blood received in culture bottles   Culture   Final    NO GROWTH 5 DAYS Performed at [facility], [address] [place], [place] [address]    Report Status  11/03/2022 FINAL  Final  Blood culture (routine x 2)     Status: None   Collection Time: 10/29/22 10:58 AM   Specimen: BLOOD  Result Value Ref Range Status   Specimen Description   Final    BLOOD RIGHT HAND Performed at [facility], [address] [place], [place] [address]    Special Requests   Final    BOTTLES DRAWN AEROBIC AND ANAEROBIC Blood  Culture results may not be optimal due to an excessive volume of blood received in culture bottles Performed at [facility], [address], [place], [place] [address]    Culture  Setup Time   Final    NO ORGANISMS SEEN PREVIOUSLY REPORTED AS: GRAM POSITIVE COCCI CORRECTED RESULTS CALLED TO: [place], AT 1116 11/01/22 D. VANHOOK Performed at [facility], [place] [address] [place], [place] [address]    Culture   Final    NO GROWTH 5 DAYS Performed at [facility], [address] [place], [place] [address]    Report Status 11/03/2022 FINAL  Final  Urine Culture     Status: Abnormal   Collection Time: 10/29/22  9:28 PM   Specimen: Urine, Catheterized  Result Value Ref Range Status   Specimen Description URINE, CATHETERIZED  Final   Special Requests   Final    NONE Performed at [facility], [place] [address], [place], [place] [address]    Culture MULTIPLE SPECIES PRESENT, SUGGEST RECOLLECTION (A)  Final   Report Status 10/31/2022 FINAL  Final  MRSA Next Gen by PCR, Nasal     Status: None   Collection Time: 10/30/22 12:00 AM   Specimen: Nasal Mucosa; Nasal Swab  Result Value Ref Range Status   MRSA by PCR Next Gen NOT DETECTED NOT DETECTED Final    Comment: (NOTE) The GeneXpert MRSA Assay (FDA approved for NASAL specimens only), is one component of a comprehensive MRSA colonization surveillance program. It is not intended to diagnose MRSA infection nor to guide or monitor treatment for MRSA infections. Test performance is not FDA approved in patients less than 28 years old. Performed at [facility], [place] [address] [place], [place] [address]   Body fluid culture w Gram Stain     Status: None   Collection Time: 10/30/22 10:28 AM   Specimen: Peritoneal Washings; Body Fluid  Result Value Ref Range Status   Specimen Description PERITONEAL  Final   Special Requests PERITONEAL DIALYSATE  Final   Gram Stain NO WBC SEEN NO ORGANISMS SEEN CYTOSPIN SMEAR   Final   Culture   Final    NO GROWTH 3 DAYS Performed at [facility], [address] [place], [place] [address]    Report Status 11/03/2022 FINAL  Final         Radiology Studies: No results found.      Scheduled Meds:  sodium chloride   Intravenous Once   sodium chloride   Intravenous Once   acidophilus  1 capsule Oral Daily   allopurinol  50 mg Oral Daily   calcitRIOL  0.25 mcg Oral Daily   Chlorhexidine Gluconate Cloth  6 each Topical Daily   fentaNYL       gentamicin cream  1 Application Topical Daily   Gerhardt's butt cream   Topical TID   hydroxyurea  1,000 mg Oral Once   hydroxyurea  1,000 mg Oral Once   midazolam       mometasone-formoterol  2 puff Inhalation BID   QUEtiapine  50 mg Oral BID   Continuous Infusions:  sodium chloride     dialysis solution 1.5% low-MG/low-CA       LOS: 8 days    Time spent: 45 minutes spent on chart review, discussion with nursing staff, consultants, updating family and interview/physical exam; more than 50% of that time was spent in counseling and/or coordination of care.    Geradine Girt,  DO [facility] Available via Epic secure chat 7am-7pm After these hours, please refer to coverage provider listed on [email or web address] 11/06/2022, 12:35 PM

## 2022-11-06 NOTE — Progress Notes (Addendum)
PHARMACY NOTE:  ANTIMICROBIAL RENAL DOSAGE ADJUSTMENT  Current antimicrobial regimen includes a mismatch between antimicrobial dosage and estimated renal function.  As per policy approved by the Pharmacy & Therapeutics and Medical Executive Committees, the antimicrobial dosage will be adjusted accordingly.  Current antimicrobial dosage:  fluconazole '100mg'$  PO q48h  Indication: antifungal prophylaxis  Renal Function:  Estimated Creatinine Clearance: 16.9 mL/min (A) (by C-G formula based on SCr of 5.22 mg/dL (H)). '[]'$      On intermittent HD, scheduled: '[]'$      On CRRT '[x]'$      On peritoneal dialysis     Antimicrobial dosage has been changed to:  fluconazole '100mg'$  PO q24 hours  Additional comments: monitor for PD tolerability   Thank you for allowing pharmacy to be a part of this patient's care.  Dimple Nanas, PharmD, BCPS 11/06/2022 8:34 PM

## 2022-11-06 NOTE — Progress Notes (Signed)
Patient has refused po meds and refused to leave leads and pulse ox on. Patient coughs when takes a drink of liquid. May need speech eval to r/o aspiration. MD aware pain refusing meds and not cooperative.

## 2022-11-06 NOTE — Progress Notes (Signed)
Patient PD machine alarmed throughout the night for slow drain .Machine screen showing that :lost dwell time: 360 min. Lost therapy volume : 5101 ml. Report given to charge nurse Tory who set up the PD machine last night.

## 2022-11-06 NOTE — Progress Notes (Signed)
[facility] NEPHROLOGY PROGRESS NOTE  Assessment/ Plan:  # Septic shock due to right lower lobe aspiration pneumonia, possible pyelo; treated with Zosyn.  Off of pressors.  Per primary team.   # ESRD on PD: does PD 6x/wk, off Sat, home unit: [facility] (followed by Dr. Eduard Clos) - using all 1.5% for now; makes good UOP can use diuretics PRN.   Bowel regimen.  Daily PD cath care.  There was a problem with PD fluid drainage and with the machine alarm.  We will obtain abdominal x-ray to look for PD catheter.  Try to drain and continue PD tonight.  I will add Metamucil as a stool softener.  #AHRF:  12/27 extubated and worsening hypoxia.  CXR with R infiltrate.  Treated with Tamiflu and Zosyn.   #Anemia:  transfuse prn.  Iron replete 12/27.  Heme notes from 11/28 show eval - started B12 injections.    Would defer ESA due to new diagnosis of myeloma.  #IgG Kappa MM, acute plasma cell leukemia, neutropenia: f/b Dr. Delton Coombes; current tx Daratumumab SQ + Bortezomib + Dexamethasone (DaraVd) q21d / Daratumumab SQ q28d.   Now concern for AML versus plasmablastic leukemia.  Status post bone marrow biopsy.  Oncology is inclining towards mostly conservative management.  Continue to follow.     #AMS: per primary; BUN <<100 doubt uremic -- prob delirium; appears has happened in prior admissions.  He is also hard of hearing.   #Hypokalemia: Probably due to PD and decreased oral intake.  Magnesium level acceptable.  Continue oral potassium chloride repletion.   # Hypophosphatemia: Not on binders.  Corrected calcium level acceptable.  I will start calcitriol, check PTH level.  Discussed with dialysis nurse.  Subjective: Seen and examined at bedside.  Status post bone marrow biopsy.  Problem with PD fluid drainage therefore getting abdominal x-ray.  Family member at the bedside.  Denies chest pain, shortness of breath, nausea or vomiting.  Objective Vital signs in last 24 hours: Vitals:    11/06/22 1020 11/06/22 1043 11/06/22 1100 11/06/22 1200  BP: (!) 91/53 94/67    Pulse: (!) 122 (!) 124    Resp: _0 Temp:  99.4 F (37.4 C)  99.4 F (37.4 C)  TempSrc:  Oral  Oral  SpO2: 92%     Weight:       Weight change:   Intake/Output Summary (Last 24 hours) at 11/06/2022 1314 Last data filed at 11/06/2022 1059 Gross per 24 hour  Intake --  Output 206 ml  Net -206 ml        Labs: RENAL PANEL Recent Labs    05/30/22 1018 06/13/22 1137 06/27/22 1038 06/27/22 1039 07/07/22 1851 07/09/22 2120 07/10/22 0420 07/25/22 0838 08/19/22 0855 09/24/22 0858 10/01/22 0953 10/15/22 1251 10/29/22 1057 10/29/22 2239 10/30/22 0500 10/31/22 0534 11/01/22 0438 11/02/22 0530 11/03/22 0531 11/03/22 0949 11/04/22 0526 11/05/22 0504 11/05/22 1508 11/06/22 0416  NA 138 138   < >  --    < > 140   < > 135 139 138 139 138 141   < > _1  K 3.5 4.0   < >  --    < > 3.5   < > 3.5 3.4* 2.6* 4.0 4.3 3.5   < > 3.6 3.5 2.9* 2.9* 3.3* 3.5 2.3* 2.5* 4.0 3.6  CL 103 104   < >  --    < > 109   < >  101 99 105 107 102 108  --  _0  CO2 26 25   < >  --    < > 19*   < > _1 --  21* _2 21* 22 20* 19* 17* 19*  GLUCOSE 113* 107*   < >  --    < > 127*   < > 132* 146* 168* 102* 108* 113*  --  102* 146* 158* 121* 121* 132* 102* 119* 106* 115*  BUN 23 34*   < >  --    < > 31*   < > 48* 37* 31* 39* 39* 44*  --  42* 47* 45* 35* 37* 39* 48* 44* 45* 43*  CREATININE 3.75* 3.86*   < >  --    < > 3.61*   < > 3.67* 4.29* 4.60* 5.48* 6.51* 7.08*  --  7.02* 6.89* 6.40* 4.98* 5.02* 5.07* 5.31* 5.30* 5.30* 5.22*  CALCIUM 9.3 9.4   < >  --    < > 9.6   < > 8.6* 9.8 8.4* 8.8* 9.8 8.6*  --  8.1* 8.2* 8.5* 8.1* 8.3* 8.4* 8.4* 8.6* 8.0* 8.2*  MG 2.1 2.6*  --  2.3  --   --   --  2.5*  --  1.6*  --  2.0  --   --  1.6* 2.0  --   --   --   --   --  1.7  --  1.8  PHOS  --   --   --   --   --   --   --   --   --   --   --   --    --   --  5.2* 5.3*  --   --   --   --   --  1.2* 2.3* 2.2*  ALBUMIN 3.9 3.9   < >  --    < > 3.4*  --  3.6 3.8 3.2* 3.3* 3.6 3.2*  --   --   --   --   --   --  2.3*  --  2.5* 2.4*  --    < > = values in this interval not displayed.      Liver Function Tests: Recent Labs  Lab 11/03/22 0949 11/05/22 0504 11/05/22 1508  AST 48*  --   --   ALT 44  --   --   ALKPHOS 28*  --   --   BILITOT 0.5  --   --   PROT 5.9*  --   --   ALBUMIN 2.3* 2.5* 2.4*    No results for input(s): "LIPASE", "AMYLASE" in the last 168 hours. No results for input(s): "AMMONIA" in the last 168 hours. CBC: Recent Labs    10/01/22 1209 10/15/22 1251 10/29/22 2120 10/29/22 2239 10/30/22 1723 10/31/22 0534 11/02/22 0530 11/03/22 0531 11/04/22 0526 11/05/22 1659 11/06/22 0416  HGB  --    < >  --    < >  --    < > 7.1* 7.9*  7.9* 7.2* 6.6* 6.3*  MCV  --    < >  --    < >  --    < > 100.9* 102.1*  103.0* 101.4* 100.0 102.1*  VITAMINB12 120*  --   --   --   --   --   --   --   --   --   --  FOLATE 18.5  --   --   --   --   --   --   --   --   --   --   FERRITIN 585*  --   --   --  1,883*  --   --   --   --   --   --   TIBC 314  --   --   --  241*  --   --   --   --   --   --   IRON 205*  --   --   --  91  --   --   --   --   --   --   RETICCTPCT  --   --  1.6  --   --   --   --   --   --   --   --    < > = values in this interval not displayed.     Cardiac Enzymes: No results for input(s): "CKTOTAL", "CKMB", "CKMBINDEX", "TROPONINI" in the last 168 hours. CBG: Recent Labs  Lab 11/01/22 0844  GLUCAP 157*     Iron Studies: No results for input(s): "IRON", "TIBC", "TRANSFERRIN", "FERRITIN" in the last 72 hours. Studies/Results: CT BONE MARROW BIOPSY & ASPIRATION  Result Date: 11/06/2022 INDICATION: History of multiple myeloma.  Question leukemia. EXAM: CT GUIDED BONE MARROW ASPIRATION AND CORE BIOPSY MEDICATIONS: None. ANESTHESIA/SEDATION: Moderate (conscious) sedation was employed during this  procedure. A total of Versed 2 mg and Fentanyl 50 mcg was administered intravenously. Moderate Sedation Time: 16 minutes. The patient's level of consciousness and vital signs were monitored continuously by radiology nursing throughout the procedure under my direct supervision. FLUOROSCOPY TIME:  CT dose in mGy was not provided. COMPLICATIONS: None immediate. Estimated blood loss: <5 mL PROCEDURE: RADIATION DOSE REDUCTION: This exam was performed according to the departmental dose-optimization program which includes automated exposure control, adjustment of the mA and/or kV according to patient size and/or use of iterative reconstruction technique. Informed written consent was obtained from the the patient and/or patient's representative after a thorough discussion of the procedural risks, benefits and alternatives. All questions were addressed. Maximal Sterile Barrier Technique was utilized including caps, mask, sterile gowns, sterile gloves, sterile drape, hand hygiene and skin antiseptic. A timeout was performed prior to the initiation of the procedure. The patient was positioned prone and non-contrast localization CT was performed of the pelvis to demonstrate the iliac marrow spaces. Maximal barrier sterile technique utilized including caps, mask, sterile gowns, sterile gloves, large sterile drape, hand hygiene, and chlorhexidine prep. Under sterile conditions and local anesthesia, an 11 gauge coaxial bone biopsy needle was advanced into the RIGHT iliac marrow space. Needle position was confirmed with CT imaging. Initially, bone marrow aspiration was performed. Next, the 11 gauge outer cannula was utilized to obtain a 1 iliac bone marrow core biopsy. Needle was removed. Hemostasis was obtained with compression. The patient tolerated the procedure well. Samples were prepared with the cytotechnologist. IMPRESSION: Successful CT-guided bone marrow aspiration and biopsy, as above. Michaelle Birks, MD [facility] Radiology Electronically Signed   By: Michaelle Birks M.D.   On: 11/06/2022 12:52    Medications: Infusions:  sodium chloride     dialysis solution 1.5% low-MG/low-CA      Scheduled Medications:  sodium chloride   Intravenous Once   sodium chloride   Intravenous Once   acidophilus  1 capsule Oral Daily  allopurinol  50 mg Oral Daily   calcitRIOL  0.25 mcg Oral Daily   Chlorhexidine Gluconate Cloth  6 each Topical Daily   fentaNYL       fluconazole  100 mg Oral Daily   gentamicin cream  1 Application Topical Daily   Gerhardt's butt cream   Topical TID   hydroxyurea  1,000 mg Oral Once   hydroxyurea  1,000 mg Oral Once   levofloxacin  500 mg Oral Daily   midazolam       mometasone-formoterol  2 puff Inhalation BID   QUEtiapine  50 mg Oral BID   valACYclovir  500 mg Oral Daily    have reviewed scheduled and prn medications.  Physical Exam: General: Able to lie flat, not in distress Heart:RRR, s1s2 nl Lungs:clear b/l, no crackle Abdomen:soft, Non-tender, distended Extremities:No edema Dialysis Access: PD catheter in place  Anshu Wehner Prasad Husam Hohn 11/06/2022,1:14 PM  LOS: 8 days

## 2022-11-06 NOTE — Procedures (Signed)
Vascular and Interventional Radiology Procedure Note  Patient: Ernest Barnett DOB: 11-Feb-1956 Medical Record Number: [id number] Note Date/Time: 11/06/22 8:48 AM   Performing Physician: Michaelle Birks, MD Assistant(s): None  Diagnosis: Hx MM. Q Leukemia   Procedure: BONE MARROW ASPIRATION and BIOPSY  Anesthesia: Conscious Sedation Complications: None Estimated Blood Loss: Minimal Specimens: Sent for Pathology  Findings:  Successful CT-guided bone marrow aspiration and biopsy A total of 1 core was obtained. Hemostasis of the tract was achieved using Manual Pressure.  Plan: Bed rest for 1 hours.  See detailed procedure note with images in PACS. The patient tolerated the procedure well without incident or complication and was returned to Floor Bed in stable condition.    Michaelle Birks, MD Vascular and Interventional Radiology Specialists [facility] Radiology   Pager. [place]

## 2022-11-06 NOTE — Progress Notes (Signed)
Pt is alert and oriented times 2. Pt refusing care. Pt stated to leave him alone. Pt will not allow me to start  fluids for his blood transfusion can start. Pt is aggressive at this time, noted. Pt received PIV from IV team and pt stated he will snatch out the PIV, noted. Pt is lying in bed at this time, noted. MD notified and Charge Nurse notified.

## 2022-11-06 NOTE — Progress Notes (Signed)
Patient to IR for biopsy.

## 2022-11-07 ENCOUNTER — Other Ambulatory Visit: Payer: Self-pay | Admitting: *Deleted

## 2022-11-07 DIAGNOSIS — Z66 Do not resuscitate: Secondary | ICD-10-CM

## 2022-11-07 DIAGNOSIS — D61818 Other pancytopenia: Secondary | ICD-10-CM | POA: Diagnosis not present

## 2022-11-07 DIAGNOSIS — Z992 Dependence on renal dialysis: Secondary | ICD-10-CM | POA: Diagnosis not present

## 2022-11-07 DIAGNOSIS — C9010 Plasma cell leukemia not having achieved remission: Secondary | ICD-10-CM | POA: Diagnosis not present

## 2022-11-07 DIAGNOSIS — J101 Influenza due to other identified influenza virus with other respiratory manifestations: Secondary | ICD-10-CM | POA: Diagnosis not present

## 2022-11-07 DIAGNOSIS — N186 End stage renal disease: Secondary | ICD-10-CM | POA: Diagnosis not present

## 2022-11-07 DIAGNOSIS — Z515 Encounter for palliative care: Secondary | ICD-10-CM | POA: Diagnosis not present

## 2022-11-07 LAB — BASIC METABOLIC PANEL
Anion gap: 10 (ref 5–15)
BUN: 42 mg/dL — ABNORMAL HIGH (ref 8–23)
CO2: 18 mmol/L — ABNORMAL LOW (ref 22–32)
Calcium: 8.1 mg/dL — ABNORMAL LOW (ref 8.9–10.3)
Chloride: 112 mmol/L — ABNORMAL HIGH (ref 98–111)
Creatinine, Ser: 5.09 mg/dL — ABNORMAL HIGH (ref 0.61–1.24)
GFR, Estimated: 12 mL/min — ABNORMAL LOW (ref >60)
Glucose, Bld: 122 mg/dL — ABNORMAL HIGH (ref 70–99)
Potassium: 3.8 mmol/L (ref 3.5–5.1)
Sodium: 140 mmol/L (ref 135–145)

## 2022-11-07 LAB — CBC WITH DIFFERENTIAL/PLATELET
Abs Immature Granulocytes: 4.48 10*3/uL — ABNORMAL HIGH (ref 0.00–0.07)
Basophils Absolute: 0.1 10*3/uL (ref 0.0–0.1)
Basophils Relative: 0 %
Eosinophils Absolute: 0 10*3/uL (ref 0.0–0.5)
Eosinophils Relative: 0 %
HCT: 17.5 % — ABNORMAL LOW (ref 39.0–52.0)
Hemoglobin: 5.9 g/dL — CL (ref 13.0–17.0)
Immature Granulocytes: 5 %
Lymphocytes Relative: 6 %
Lymphs Abs: 5.4 10*3/uL — ABNORMAL HIGH (ref 0.7–4.0)
MCH: 33.9 pg (ref 26.0–34.0)
MCHC: 33.7 g/dL (ref 30.0–36.0)
MCV: 100.6 fL — ABNORMAL HIGH (ref 80.0–100.0)
Monocytes Absolute: 73.5 10*3/uL — ABNORMAL HIGH (ref 0.1–1.0)
Monocytes Relative: 85 %
Neutro Abs: 3.5 10*3/uL (ref 1.7–7.7)
Neutrophils Relative %: 4 %
Platelets: 13 10*3/uL — CL (ref 150–400)
RBC: 1.74 MIL/uL — ABNORMAL LOW (ref 4.22–5.81)
RDW: 24.6 % — ABNORMAL HIGH (ref 11.5–15.5)
WBC: 86.9 10*3/uL (ref 4.0–10.5)
nRBC: 0.1 % (ref 0.0–0.2)

## 2022-11-07 LAB — PROTEIN ELECTROPHORESIS, SERUM
A/G Ratio: 1.1 (ref 0.7–1.7)
Albumin ELP: 2.8 g/dL — ABNORMAL LOW (ref 2.9–4.4)
Alpha-1-Globulin: 0.3 g/dL (ref 0.0–0.4)
Alpha-2-Globulin: 1.1 g/dL — ABNORMAL HIGH (ref 0.4–1.0)
Beta Globulin: 0.5 g/dL — ABNORMAL LOW (ref 0.7–1.3)
Gamma Globulin: 0.6 g/dL (ref 0.4–1.8)
Globulin, Total: 2.5 g/dL (ref 2.2–3.9)
M-Spike, %: 0.1 g/dL — ABNORMAL HIGH
Total Protein ELP: 5.3 g/dL — ABNORMAL LOW (ref 6.0–8.5)

## 2022-11-07 LAB — URIC ACID: Uric Acid, Serum: 7.7 mg/dL (ref 3.7–8.6)

## 2022-11-07 LAB — PARATHYROID HORMONE, INTACT (NO CA): PTH: 117 pg/mL — ABNORMAL HIGH (ref 15–65)

## 2022-11-07 LAB — FLOW CYTOMETRY

## 2022-11-07 LAB — PHOSPHORUS: Phosphorus: 2.8 mg/dL (ref 2.5–4.6)

## 2022-11-07 MED ORDER — ALLOPURINOL 100 MG PO TABS: 50.0000 mg | ORAL_TABLET | Freq: Every day | ORAL | Status: AC

## 2022-11-07 MED ORDER — ACETAMINOPHEN 325 MG PO TABS: 650.0000 mg | ORAL_TABLET | Freq: Four times a day (QID) | ORAL | Status: AC | PRN

## 2022-11-07 MED ORDER — HEPARIN SODIUM (PORCINE) 1000 UNIT/ML IJ SOLN
INTRAPERITONEAL | Status: DC | PRN
  Filled 2022-11-07 (×2): qty 6000

## 2022-11-07 MED ORDER — HYDROXYUREA 500 MG PO CAPS: 1000.0000 mg | ORAL_CAPSULE | Freq: Once | ORAL | 0 refills | Status: AC

## 2022-11-07 MED ORDER — LEVOFLOXACIN 500 MG PO TABS: 500.0000 mg | ORAL_TABLET | ORAL | Status: AC

## 2022-11-07 MED ORDER — QUETIAPINE FUMARATE 50 MG PO TABS: 50.0000 mg | ORAL_TABLET | Freq: Two times a day (BID) | ORAL | Status: AC

## 2022-11-07 MED ORDER — FLUCONAZOLE 100 MG PO TABS: 100.0000 mg | ORAL_TABLET | Freq: Every day | ORAL | Status: AC

## 2022-11-07 MED ORDER — DELFLEX-LC/1.5% DEXTROSE 344 MOSM/L IP SOLN: INTRAPERITONEAL | Status: DC

## 2022-11-07 MED ORDER — VALACYCLOVIR HCL 500 MG PO TABS: 500.0000 mg | ORAL_TABLET | Freq: Every day | ORAL | Status: AC

## 2022-11-07 MED ORDER — IPRATROPIUM-ALBUTEROL 0.5-2.5 (3) MG/3ML IN SOLN: 3.0000 mL | RESPIRATORY_TRACT | Status: AC | PRN

## 2022-11-07 MED ORDER — RISAQUAD PO CAPS: 1.0000 | ORAL_CAPSULE | Freq: Every day | ORAL | Status: AC

## 2022-11-07 NOTE — Care Management Important Message (Signed)
Important Message  Patient Details  Name: Ernest Barnett MRN: [id number] Date of Birth: September 07, 1956   Medicare Important Message Given:  Yes  Patient has a Contact Precaution order in Place. Called patient room patient advised me to send to home address  Will mail the copy to the patient home address.     Teva Bronkema 11/07/2022, 3:07 PM

## 2022-11-07 NOTE — Consult Note (Addendum)
   [facility] CM Inpatient Consult   11/07/2022  Ernest Barnett [birth year] [id number]  [employer] [ACO] Patient: [name] Medicare   Primary Care Provider:  Lindell Spar, MD with [facility] Primary [facility]  *Addendum: on Droplet Precautions   Patient screened for hospitalization with noted high risk score for unplanned readmission risk and length of stay and to assess for potential [place] [facility] service needs for post hospital transition for care coordination.  Review of patient's electronic medical record reveals patient is being recommended for a skilled nursing facility level of care for post hospital transition. Patient maybe transitioning to another inpatient facility when bed available.   Plan:   Referral request for community care coordination:  Patient also for potential transfer to [facility] per Oncologist MD notes. Continue to follow.  Of note, [facility] Care Management/Population Health does not replace or interfere with any arrangements made by the Inpatient Transition of Care team.  For questions contact:   Natividad Brood, RN BSN [place]  [phone] business mobile phone Toll free office [phone]  *[place]  [phone] Fax number: [phone] [email or web address]

## 2022-11-07 NOTE — Progress Notes (Signed)
[facility] NEPHROLOGY PROGRESS NOTE  Assessment/ Plan:  # Septic shock due to right lower lobe aspiration pneumonia, possible pyelo; treated with Zosyn.  Resolved   # ESRD on PD: does PD 6x/wk, off Sat, home unit: [facility] (followed by Dr. Eduard Clos) - using all 1.5% for now; makes good UOP can use diuretics PRN.   Bowel regimen but having diarrhea. Daily PD cath care.   Having sluggish drains. Havign diarrhea, AXR reassuring.  Add some heparin to PD fluid.    #AHRF:  12/27 extubated and worsening hypoxia.  CXR with R infiltrate.  Treated with Tamiflu and Zosyn.   #Anemia:  transfuse prn.  Iron replete 12/27.  Heme notes from 11/28 show eval - started B12 injections.   Mgmt per heme.   #IgG Kappa MM, acute plasma cell leukemia, neutropenia: f/b Dr. Delton Coombes; current tx Daratumumab SQ + Bortezomib + Dexamethasone (DaraVd) q21d / Daratumumab SQ q28d. Now with AML transformation, to go to [facility]    #AMS: per primary; BUN <<100 doubt uremic -- prob delirium; appears has happened in prior admissions.  He is also hard of hearing.   #Hypokalemia: Probably due to PD and decreased oral intake +/- diarrhea.  Magnesium level acceptable.  Continue oral potassium chloride repletion.   # Hypophosphatemia: Not on binders.  Corrected calcium level acceptable.  I will start calcitriol, check PTH level.  Discussed with dialysis nurse.  Subjective:  Ongoing sluggish drains BM Bx consistent with transformation to AML, plan is for [facility] transfer today Family at bedside Using all 1.5% dextrose,  AXR confirmed catheter in proper position.  Having diarrhea  Objective Vital signs in last 24 hours: Vitals:   11/07/22 0723 11/07/22 0900 11/07/22 1000 11/07/22 1100  BP: 118/69     Pulse: (!) 119 (!) 114 (!) 119   Resp: (!) 24 13 (!) 21 18  Temp: (S) 98.7 F (37.1 C)     TempSrc: Oral     SpO2: 94% 93% 95%   Weight: 115.2 kg      Weight change:   Intake/Output Summary (Last 24 hours)  at 11/07/2022 1257 Last data filed at 11/07/2022 1124 Gross per 24 hour  Intake 841.86 ml  Output 1112 ml  Net -270.14 ml        Labs: RENAL PANEL Recent Labs    05/30/22 1018 06/13/22 1137 06/27/22 1038 06/27/22 1039 07/07/22 1851 07/09/22 2120 07/10/22 0420 07/25/22 0838 08/19/22 0855 09/24/22 0858 10/01/22 0953 10/15/22 1251 10/29/22 1057 10/29/22 2239 10/30/22 0500 10/31/22 0534 11/01/22 0438 11/02/22 0530 11/03/22 0531 11/03/22 0949 11/04/22 0526 11/05/22 0504 11/05/22 1508 11/06/22 0416  NA 138 138   < >  --    < > 140   < > 135 139 138 139 138 141   < > _0  K 3.5 4.0   < >  --    < > 3.5   < > 3.5 3.4* 2.6* 4.0 4.3 3.5   < > 3.6 3.5 2.9* 2.9* 3.3* 3.5 2.3* 2.5* 4.0 3.6  CL 103 104   < >  --    < > 109   < > 101 99 105 107 102 108  --  _1  CO2 26 25   < >  --    < > 19*   < > _2 --  21* 23 22  22 21* 22 20* 19* 17* 19*  GLUCOSE 113* 107*   < >  --    < > 127*   < > 132* 146* 168* 102* 108* 113*  --  102* 146* 158* 121* 121* 132* 102* 119* 106* 115*  BUN 23 34*   < >  --    < > 31*   < > 48* 37* 31* 39* 39* 44*  --  42* 47* 45* 35* 37* 39* 48* 44* 45* 43*  CREATININE 3.75* 3.86*   < >  --    < > 3.61*   < > 3.67* 4.29* 4.60* 5.48* 6.51* 7.08*  --  7.02* 6.89* 6.40* 4.98* 5.02* 5.07* 5.31* 5.30* 5.30* 5.22*  CALCIUM 9.3 9.4   < >  --    < > 9.6   < > 8.6* 9.8 8.4* 8.8* 9.8 8.6*  --  8.1* 8.2* 8.5* 8.1* 8.3* 8.4* 8.4* 8.6* 8.0* 8.2*  MG 2.1 2.6*  --  2.3  --   --   --  2.5*  --  1.6*  --  2.0  --   --  1.6* 2.0  --   --   --   --   --  1.7  --  1.8  PHOS  --   --   --   --   --   --   --   --   --   --   --   --   --   --  5.2* 5.3*  --   --   --   --   --  1.2* 2.3* 2.2*  ALBUMIN 3.9 3.9   < >  --    < > 3.4*  --  3.6 3.8 3.2* 3.3* 3.6 3.2*  --   --   --   --   --   --  2.3*  --  2.5* 2.4*  --    < > = values in this interval not displayed.      Liver Function Tests: Recent  Labs  Lab 11/03/22 0949 11/05/22 0504 11/05/22 1508  AST 48*  --   --   ALT 44  --   --   ALKPHOS 28*  --   --   BILITOT 0.5  --   --   PROT 5.9*  --   --   ALBUMIN 2.3* 2.5* 2.4*    No results for input(s): "LIPASE", "AMYLASE" in the last 168 hours. No results for input(s): "AMMONIA" in the last 168 hours. CBC: Recent Labs    10/01/22 1209 10/15/22 1251 10/29/22 2120 10/29/22 2239 10/30/22 1723 10/31/22 0534 11/02/22 0530 11/03/22 0531 11/04/22 0526 11/05/22 1659 11/06/22 0416  HGB  --    < >  --    < >  --    < > 7.1* 7.9*  7.9* 7.2* 6.6* 6.3*  MCV  --    < >  --    < >  --    < > 100.9* 102.1*  103.0* 101.4* 100.0 102.1*  VITAMINB12 120*  --   --   --   --   --   --   --   --   --   --   FOLATE 18.5  --   --   --   --   --   --   --   --   --   --   FERRITIN 585*  --   --   --  1,883*  --   --   --   --   --   --   TIBC 314  --   --   --  241*  --   --   --   --   --   --   IRON 205*  --   --   --  91  --   --   --   --   --   --   RETICCTPCT  --   --  1.6  --   --   --   --   --   --   --   --    < > = values in this interval not displayed.     Cardiac Enzymes: No results for input(s): "CKTOTAL", "CKMB", "CKMBINDEX", "TROPONINI" in the last 168 hours. CBG: Recent Labs  Lab 11/01/22 0844  GLUCAP 157*     Iron Studies: No results for input(s): "IRON", "TIBC", "TRANSFERRIN", "FERRITIN" in the last 72 hours. Studies/Results: DG Abd Portable 1V  Result Date: 11/06/2022 CLINICAL DATA:  Ileus EXAM: PORTABLE ABDOMEN - 1 VIEW COMPARISON:  CT 10/29/2022 FINDINGS: Films are under penetrated peripheral overlapping artifacts. Indwelling right ureteral stent. Proximal pigtail in the expected location of the right renal hilum and distal to the left side of the bladder. Separate peritoneal dialysis catheter noted in the pelvis on the left side. Gas is seen in nondilated loops of small and large bowel. No frank obstruction. No definite free air on these portable supine  radiographs. Degenerative changes of the spine. IMPRESSION: Nonspecific bowel gas pattern. Scattered air-filled loops of nondilated bowel. Ureteral stent.  Peritoneal dialysis catheter. Electronically Signed   By: Jill Side M.D.   On: 11/06/2022 13:40   CT BONE MARROW BIOPSY & ASPIRATION  Result Date: 11/06/2022 INDICATION: History of multiple myeloma.  Question leukemia. EXAM: CT GUIDED BONE MARROW ASPIRATION AND CORE BIOPSY MEDICATIONS: None. ANESTHESIA/SEDATION: Moderate (conscious) sedation was employed during this procedure. A total of Versed 2 mg and Fentanyl 50 mcg was administered intravenously. Moderate Sedation Time: 16 minutes. The patient's level of consciousness and vital signs were monitored continuously by radiology nursing throughout the procedure under my direct supervision. FLUOROSCOPY TIME:  CT dose in mGy was not provided. COMPLICATIONS: None immediate. Estimated blood loss: <5 mL PROCEDURE: RADIATION DOSE REDUCTION: This exam was performed according to the departmental dose-optimization program which includes automated exposure control, adjustment of the mA and/or kV according to patient size and/or use of iterative reconstruction technique. Informed written consent was obtained from the the patient and/or patient's representative after a thorough discussion of the procedural risks, benefits and alternatives. All questions were addressed. Maximal Sterile Barrier Technique was utilized including caps, mask, sterile gowns, sterile gloves, sterile drape, hand hygiene and skin antiseptic. A timeout was performed prior to the initiation of the procedure. The patient was positioned prone and non-contrast localization CT was performed of the pelvis to demonstrate the iliac marrow spaces. Maximal barrier sterile technique utilized including caps, mask, sterile gowns, sterile gloves, large sterile drape, hand hygiene, and chlorhexidine prep. Under sterile conditions and local anesthesia, an 11  gauge coaxial bone biopsy needle was advanced into the RIGHT iliac marrow space. Needle position was confirmed with CT imaging. Initially, bone marrow aspiration was performed. Next, the 11 gauge outer cannula was utilized to obtain a 1 iliac bone marrow core biopsy. Needle was removed. Hemostasis was obtained with compression. The patient tolerated the procedure well. Samples were prepared with  the cytotechnologist. IMPRESSION: Successful CT-guided bone marrow aspiration and biopsy, as above. Michaelle Birks, MD Vascular and Interventional Radiology Specialists [facility] Radiology Electronically Signed   By: Michaelle Birks M.D.   On: 11/06/2022 12:52    Medications: Infusions:  sodium chloride     dialysis solution 1.5% low-MG/low-CA      Scheduled Medications:  sodium chloride   Intravenous Once   sodium chloride   Intravenous Once   acidophilus  1 capsule Oral Daily   allopurinol  50 mg Oral Daily   calcitRIOL  0.25 mcg Oral Daily   Chlorhexidine Gluconate Cloth  6 each Topical Daily   fluconazole  100 mg Oral Daily   gentamicin cream  1 Application Topical Daily   Gerhardt's butt cream   Topical TID   hydroxyurea  1,000 mg Oral Once   levofloxacin  500 mg Oral Q48H   mometasone-formoterol  2 puff Inhalation BID   psyllium  1 packet Oral Daily   QUEtiapine  50 mg Oral BID   valACYclovir  500 mg Oral Daily    have reviewed scheduled and prn medications.  Physical Exam: General: Able to lie flat, not in distress Heart:RRR, s1s2 nl Lungs:clear b/l, no crackle Abdomen:soft, Non-tender, distended Extremities:No edema Dialysis Access: PD catheter in place  Erique Kaser B Camylle Whicker 11/07/2022,12:57 PM  LOS: 9 days

## 2022-11-07 NOTE — Discharge Summary (Addendum)
Physician Discharge Summary  Ernest Barnett QJF:[id number] DOB: Oct 29, 1956 DOA: 10/29/2022  PCP: Lindell Spar, MD  Admit date: 10/29/2022 Discharge date: 11/07/2022  Admitted From: home Discharge disposition: [facility] inpatient oncology    Recommendations for Outpatient Follow-Up:   Tx accepted by Dr. 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  Code status: DNR   History of Present Illness:   67 y/o male with multiple medical problems presented to [facility] with abdominal pain and fever to 103. Found to be FLU A positive. Also noted to have 30mmR ureteral stone with hydroureteronephrosis. Moved to the ICU for sepsis, intubated and take to the OR for R nephrostomy stent and stone removal.    12/26 admitted, to OR with urology for cystoscopy and right ureteral stent; GPC 1/4 bottles 12/27 extubated, confused, found to have RLL infiltrate, on BIPAP and HHF O2 and precedex.  Peritoneal fluid analysis showed 12 WBC. Changed from ceftriaxone to zosyn.  12/28 Agitation improved.  Had worsening shock and work of breathing last night peripheral smear: acute plasma cell leukemia Hematology consult: could consider G-CSF and salvage chemo if he survives this illness 1/2 tx to [facility] 1/3 s/p BMB and palliative care consult 1/4  Flow cytometry submitted on 10/31/2022 reveals changes consistent with AML versus plasmablastic leukemia.  He underwent a bone marrow biopsy yesterday.  Preliminary review of the bone marrow biopsy is consistent with AML.      Hospital Course by  Problem:   Severe sepsis due to: right lower lobe aspiration pneumonia, possibly obstructive pyelo though CT did not show stranding around kidney GPC 1/4 BC likely contaminant Zosyn stopped 1/1   Influenza A -droplet precautions   Acute metabolic encephalopathy with combativeness > related to prior frontal lobe stroke and acute infection (has had this before) ICU delirium> severe intermittent agitation, improved 12/31 Seroquel -delirium precautions  -his mental status appears to wax and wane but at bedside with his son/daughter/wife he did say he wanted to be transferred and see what options he has for his "cancer" Precedex weaned off Out of bed if able PT/OT-SNF   ESRD on PD Hypokalemia Ureteral stone s/p R uretal stent PD per renal  Monitor BMET and UOP Replace electrolytes per renal   Acute on chronic macrocytic anemia with severe thrombocytopenia Multiple myeloma Acute plasma cell leukemia, poor prognosis Neutropenia Flow cytometry submitted on 10/31/2022 reveals changes consistent with AML versus plasmablastic leukemia.  He underwent a bone marrow biopsy yesterday.  Preliminary review of the bone marrow biopsy is consistent with AML.  Transfuse PRBC for Hgb < 7 gm/dL- currently refusing- hgb 5.9 -hematology consult -s/p BMB -- hydroxyurea   Nausea -PRN zofran -appear resolved, asking for Dr. PMalachi Bonds-liquid diet -xray with air filled loops of bowel   Obesity Estimated body mass index is 40.97 kg/m as calculated from the following:   Height as of 10/22/22: 5' 6.5" (1.689 m).   Weight as of this encounter: 116.9 kg.           Medical Consultants:   Palliative care Oncology PCCM   Discharge  Exam:   Vitals:   11/07/22 1000 11/07/22 1100  BP:    Pulse: (!) 119   Resp: (!) 21 18  Temp:    SpO2: 95%    Vitals:   11/07/22 0723 11/07/22 0900 11/07/22 1000 11/07/22 1100  BP: 118/69     Pulse: (!) 119 (!) 114 (!) 119   Resp: (!) 24 13 (!) 21 18   Temp: (S) 98.7 F (37.1 C)     TempSrc: Oral     SpO2: 94% 93% 95%   Weight: 115.2 kg       General exam: Appears calm and comfortable.    The results of significant diagnostics from this hospitalization (including imaging, microbiology, ancillary and laboratory) are listed below for reference.     Procedures and Diagnostic Studies:   DG CHEST PORT 1 VIEW  Result Date: 10/30/2022 CLINICAL DATA:  Acute respiratory failure with hypoxemia. EXAM: PORTABLE CHEST 1 VIEW COMPARISON:  Radiographs 10/29/2022, PET-CT 03/08/2021 and chest CT 02/15/2021. FINDINGS: 1039 hours. Lordotic positioning. The heart size and mediastinal contours are stable with aortic atherosclerosis. There is new focal airspace disease in the right infrahilar region which may be within the middle or lower lobe. The left lung is clear. No pleural effusion or pneumothorax. No acute osseous findings are evident. Telemetry leads overlie the chest. IMPRESSION: New right infrahilar airspace disease suspicious for pneumonia. Followup PA and lateral chest X-ray is recommended in 3-4 weeks following trial of antibiotic therapy to ensure resolution and exclude underlying malignancy. Electronically Signed   By: Richardean Sale M.D.   On: 10/30/2022 11:14   DG C-Arm 1-60 Min  Result Date: 10/30/2022 CLINICAL DATA:  Stent placement EXAM: DG C-ARM 1-60 MIN FLUOROSCOPY: Fluoroscopy Time:  20.6 Radiation Exposure Index (if provided by the fluoroscopic device): 12.2 mGy Number of Acquired Spot Images: 2 FINDINGS: Multiple intraoperative fluoroscopic spot images are provided without a radiologist present. IMPRESSION: Intraoperative fluoroscopic images during stent placement. Electronically Signed   By: Placido Sou M.D.   On: 10/30/2022 00:57   CT HEAD WO CONTRAST (5MM)  Result Date: 10/29/2022 CLINICAL DATA:  Head trauma, minor. Mental status changes. Combative. EXAM: CT HEAD WITHOUT CONTRAST TECHNIQUE: Contiguous axial images were  obtained from the base of the skull through the vertex without intravenous contrast. RADIATION DOSE REDUCTION: This exam was performed according to the departmental dose-optimization program which includes automated exposure control, adjustment of the mA and/or kV according to patient size and/or use of iterative reconstruction technique. COMPARISON:  07/09/2022 FINDINGS: Brain: The study suffers from motion degradation. There is generalized brain atrophy. There chronic small-vessel ischemic changes throughout the cerebral hemispheric white matter. There is an old right frontal cortical and subcortical infarction. No mass, hemorrhage, hydrocephalus or extra-axial collection. Vascular: There is atherosclerotic calcification of the major vessels at the base of the brain. Skull: Negative Sinuses/Orbits: No significant sinus disease. Globe prosthesis on the right. Other: None IMPRESSION: No acute or traumatic finding. Atrophy and chronic small-vessel ischemic changes of the white matter. 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 Electronically Signed   By: Nelson Chimes M.D.   On: 10/29/2022 15:09   CT ABDOMEN PELVIS WO CONTRAST  Result Date: 10/29/2022 CLINICAL DATA:  Altered mental status and sepsis EXAM: CT ABDOMEN AND PELVIS WITHOUT CONTRAST TECHNIQUE: Multidetector CT imaging of the abdomen and pelvis was performed following the standard protocol without IV contrast. RADIATION DOSE REDUCTION: This exam was performed according to the departmental dose-optimization program which includes automated exposure control,  adjustment of the mA and/or kV according to patient size and/or use of iterative reconstruction technique. COMPARISON:  None Available. FINDINGS: Lower chest: Technically challenging examination due to patient motion artifact. Bilateral lower lobe ground-glass opacities. No pleural effusion or pneumothorax demonstrated. Partially imaged heart size is normal. Coronary artery calcifications.  Hepatobiliary: No focal hepatic lesions. No intra or extrahepatic biliary ductal dilation. Cholecystectomy. Pancreas: No focal lesions or main ductal dilation. Spleen: Surgically absent. Adrenals/Urinary Tract: No adrenal nodules. No suspicious renal masses. Mild right hydronephrosis to the level of a 4 mm stone in the distal ureter (3:78). Bilateral simple-appearing cysts. No focal bladder wall thickening. Stomach/Bowel: Small hiatal hernia. Normal appearance of the stomach. No evidence of bowel wall thickening, distention, or inflammatory changes. The appendix is not discretely seen. Vascular/Lymphatic: Aortic atherosclerosis. No enlarged abdominal or pelvic lymph nodes. Reproductive: Prostate is unremarkable. Other: Peritoneal dialysis catheter present in the lower abdomen. No free air or free fluid. No fluid collections. Musculoskeletal: No acute or abnormal lytic or blastic osseous lesions. 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 IMPRESSION: 1. Challenging examination due to patient motion artifact. 2. Mild right hydronephrosis to the level of a 4 mm stone in the distal ureter. 3. Bilateral lower lobe ground-glass opacities, likely infectious/inflammatory. 4. Coronary artery calcification. Aortic Atherosclerosis (ICD10-I70.0). Electronically Signed   By: Darrin Nipper M.D.   On: 10/29/2022 14:43   DG Chest Portable 1 View  Result Date: 10/29/2022 CLINICAL DATA:  Fever.  Sepsis. EXAM: PORTABLE CHEST 1 VIEW COMPARISON:  CT 02/15/2021 FINDINGS: Cardiomegaly. Lordotic positioning. Upper lungs are clear. No lower lobe dense consolidation or collapse. Cannot rule out minimal patchy density at the lung bases. No pleural effusion. No bone finding. IMPRESSION: Cardiomegaly. Lordotic positioning. Cannot rule out minimal patchy density at the lung bases. No dense consolidation or collapse. Electronically Signed   By: Nelson Chimes M.D.   On: 10/29/2022 10:55     Labs:    Basic Metabolic Panel: Recent Labs  Lab 11/03/22 0949 11/04/22 0526 11/05/22 0504 11/05/22 1508 11/06/22 0416  NA 139 139 140 140 141  K 3.5 2.3* 2.5* 4.0 3.6  CL 109 103 106 109 111  CO2 22 20* 19* 17* 19*  GLUCOSE 132* 102* 119* 106* 115*  BUN 39* 48* 44* 45* 43*  CREATININE 5.07* 5.31* 5.30* 5.30* 5.22*  CALCIUM 8.4* 8.4* 8.6* 8.0* 8.2*  MG  --   --  1.7  --  1.8  PHOS  --   --  1.2* 2.3* 2.2*   GFR Estimated Creatinine Clearance: 17 mL/min (A) (by C-G formula based on SCr of 5.22 mg/dL (H)). Liver Function Tests: Recent Labs  Lab 11/03/22 0949 11/05/22 0504 11/05/22 1508  AST 48*  --   --   ALT 44  --   --   ALKPHOS 28*  --   --   BILITOT 0.5  --   --   PROT 5.9*  --   --   ALBUMIN 2.3* 2.5* 2.4*   No results for input(s): "LIPASE", "AMYLASE" in the last 168 hours. No results for input(s): "AMMONIA" in the last 168 hours. Coagulation profile No results for input(s): "INR", "PROTIME" in the last 168 hours.  CBC: Recent Labs  Lab 11/01/22 0438 11/02/22 0530 11/03/22 0531 11/04/22 0526 11/05/22 1659 11/06/22 0416  WBC 16.1* 25.2* 40.3*  42.7* 113.0* 125.3* 123.4*  NEUTROABS 0.2*  --  3.2 1.1* 18.0* 0.0*  HGB 7.6* 7.1* 7.9*  7.9* 7.2*  6.6* 6.3*  HCT 22.4* 21.6* 23.8*  24.4* 21.4* 19.9* 19.8*  MCV 100.4* 100.9* 102.1*  103.0* 101.4* 100.0 102.1*  PLT 33* 30* 24*  26* 39* 37* 20*   Cardiac Enzymes: No results for input(s): "CKTOTAL", "CKMB", "CKMBINDEX", "TROPONINI" in the last 168 hours. BNP: Invalid input(s): "POCBNP" CBG: Recent Labs  Lab 11/01/22 0844  GLUCAP 157*   D-Dimer No results for input(s): "DDIMER" in the last 72 hours. Hgb A1c No results for input(s): "HGBA1C" in the last 72 hours. Lipid Profile No results for input(s): "CHOL", "HDL", "LDLCALC", "TRIG", "CHOLHDL", "LDLDIRECT" in the last 72 hours. Thyroid function studies No results for input(s): "TSH", "T4TOTAL", "T3FREE", "THYROIDAB" in the last 72 hours.  Invalid  input(s): "FREET3" Anemia work up No results for input(s): "VITAMINB12", "FOLATE", "FERRITIN", "TIBC", "IRON", "RETICCTPCT" in the last 72 hours. Microbiology Recent Results (from the past 240 hour(s))  Resp panel by RT-PCR (RSV, Flu A&B, Covid) Anterior Nasal Swab     Status: Abnormal   Collection Time: 10/29/22 10:24 AM   Specimen: Anterior Nasal Swab  Result Value Ref Range Status   SARS Coronavirus 2 by RT PCR NEGATIVE NEGATIVE Final    Comment: (NOTE) SARS-CoV-2 target nucleic acids are NOT DETECTED.  The SARS-CoV-2 RNA is generally detectable in upper respiratory specimens during the acute phase of infection. The lowest concentration of SARS-CoV-2 viral copies this assay can detect is 138 copies/mL. A negative result does not preclude SARS-Cov-2 infection and should not be used as the sole basis for treatment or other patient management decisions. A negative result may occur with  improper specimen collection/handling, submission of specimen other than nasopharyngeal swab, presence of viral mutation(s) within the areas targeted by this assay, and inadequate number of viral copies(<138 copies/mL). A negative result must be combined with clinical observations, patient history, and epidemiological information. The expected result is Negative.  Fact Sheet for Patients:  EntrepreneurPulse.com.au  Fact Sheet for Healthcare Providers:  IncredibleEmployment.be  This test is no t yet approved or cleared by the [place] FDA and  has been authorized for detection and/or diagnosis of SARS-CoV-2 by FDA under an Emergency Use Authorization (EUA). This EUA will remain  in effect (meaning this test can be used) for the duration of the COVID-19 declaration under Section 564(b)(1) of the Act, 21 U.S.C.section 360bbb-3(b)(1), unless the authorization is terminated  or revoked sooner.       Influenza A by PCR POSITIVE (A) NEGATIVE Final   Influenza  B by PCR NEGATIVE NEGATIVE Final    Comment: (NOTE) The Xpert Xpress SARS-CoV-2/FLU/RSV plus assay is intended as an aid in the diagnosis of influenza from Nasopharyngeal swab specimens and should not be used as a sole basis for treatment. Nasal washings and aspirates are unacceptable for Xpert Xpress SARS-CoV-2/FLU/RSV testing.  Fact Sheet for Patients: EntrepreneurPulse.com.au  Fact Sheet for Healthcare Providers: IncredibleEmployment.be  This test is not yet approved or cleared by the [place] FDA and has been authorized for detection and/or diagnosis of SARS-CoV-2 by FDA under an Emergency Use Authorization (EUA). This EUA will remain in effect (meaning this test can be used) for the duration of the COVID-19 declaration under Section 564(b)(1) of the Act, 21 U.S.C. section 360bbb-3(b)(1), unless the authorization is terminated or revoked.     Resp Syncytial Virus by PCR NEGATIVE NEGATIVE Final    Comment: (NOTE) Fact Sheet for Patients: EntrepreneurPulse.com.au  Fact Sheet for Healthcare Providers: IncredibleEmployment.be  This test is not yet approved or cleared by the  [place] Architectural technologist and has been authorized for detection and/or diagnosis of SARS-CoV-2 by FDA under an Print production planner (EUA). This EUA will remain in effect (meaning this test can be used) for the duration of the COVID-19 declaration under Section 564(b)(1) of the Act, 21 U.S.C. section 360bbb-3(b)(1), unless the authorization is terminated or revoked.  Performed at [facility], [address] [place], [place] [address]   Blood culture (routine x 2)     Status: None   Collection Time: 10/29/22 10:25 AM   Specimen: BLOOD  Result Value Ref Range Status   Specimen Description BLOOD LEFT ARM  Final   Special Requests   Final    BOTTLES DRAWN AEROBIC AND ANAEROBIC Blood Culture results may not be optimal due to an  inadequate volume of blood received in culture bottles   Culture   Final    NO GROWTH 5 DAYS Performed at [facility], [address] [place], [place] [address]    Report Status 11/03/2022 FINAL  Final  Blood culture (routine x 2)     Status: None   Collection Time: 10/29/22 10:58 AM   Specimen: BLOOD  Result Value Ref Range Status   Specimen Description   Final    BLOOD RIGHT HAND Performed at [facility], [address], [place], [place] [address]    Special Requests   Final    BOTTLES DRAWN AEROBIC AND ANAEROBIC Blood Culture results may not be optimal due to an excessive volume of blood received in culture bottles Performed at [facility], [address] [place], [place] [address]    Culture  Setup Time   Final    NO ORGANISMS SEEN PREVIOUSLY REPORTED AS: GRAM POSITIVE COCCI CORRECTED RESULTS CALLED TO: [facility], AT 1116 11/01/22 D. VANHOOK Performed at [facility], [place] [address], [place], [place] [address]    Culture   Final    NO GROWTH 5 DAYS Performed at [facility], [address], [place], [place] [address]    Report Status 11/03/2022 FINAL  Final  Urine Culture     Status: Abnormal   Collection Time: 10/29/22  9:28 PM   Specimen: Urine, Catheterized  Result Value Ref Range Status   Specimen Description URINE, CATHETERIZED  Final   Special Requests   Final    NONE Performed at [facility], [place] [address] [place], [place] [address]    Culture MULTIPLE SPECIES PRESENT, SUGGEST RECOLLECTION (A)  Final   Report Status 10/31/2022 FINAL  Final  MRSA Next Gen by PCR, Nasal     Status: None   Collection Time: 10/30/22 12:00 AM   Specimen: Nasal Mucosa; Nasal Swab  Result Value Ref Range Status   MRSA by PCR Next Gen NOT DETECTED NOT DETECTED Final    Comment: (NOTE) The GeneXpert MRSA Assay (FDA approved for NASAL specimens only), is one component of a comprehensive MRSA colonization surveillance program. It is not intended to diagnose MRSA  infection nor to guide or monitor treatment for MRSA infections. Test performance is not FDA approved in patients less than 46 years old. Performed at [facility], [place] [address], [place],  [address]   Body fluid culture w Gram Stain     Status: None   Collection Time: 10/30/22 10:28 AM   Specimen: Peritoneal Washings; Body Fluid  Result Value Ref Range Status   Specimen Description PERITONEAL  Final   Special Requests PERITONEAL DIALYSATE  Final   Gram  Stain NO WBC SEEN NO ORGANISMS SEEN CYTOSPIN SMEAR   Final   Culture   Final    NO GROWTH 3 DAYS Performed at [facility], [place] [address] [place], [place] [address]    Report Status 11/03/2022 FINAL  Final     Discharge Instructions:   Discharge Instructions     Increase activity slowly   Complete by: As directed    No wound care   Complete by: As directed       Allergies as of 11/07/2022   No Known Allergies      Medication List     STOP taking these medications    acetaminophen-codeine 300-30 MG tablet Commonly known as: TYLENOL #3   acyclovir 200 MG capsule Commonly known as: ZOVIRAX   aspirin EC 81 MG tablet   bortezomib IV 3.5 MG injection Commonly known as: VELCADE   dexamethasone 4 MG tablet Commonly known as: DECADRON   erythromycin ophthalmic ointment   gabapentin 300 MG capsule Commonly known as: NEURONTIN   oxyCODONE 5 MG immediate release tablet Commonly known as: Oxy IR/ROXICODONE   polyethylene glycol 17 g packet Commonly known as: MIRALAX / GLYCOLAX   potassium chloride SA 20 MEQ tablet Commonly known as: KLOR-CON M   pravastatin 80 MG tablet Commonly known as: PRAVACHOL   senna 8.6 MG Tabs tablet Commonly known as: SENOKOT   sildenafil 20 MG tablet Commonly known as: REVATIO   tamsulosin 0.4 MG Caps capsule Commonly known as: FLOMAX   tiZANidine 2 MG tablet Commonly known as: ZANAFLEX   torsemide 100 MG tablet Commonly known as: DEMADEX        TAKE these medications    acetaminophen 325 MG tablet Commonly known as: TYLENOL Take 2 tablets (650 mg total) by mouth every 6 (six) hours as needed for fever or mild pain.   acidophilus Caps capsule Take 1 capsule by mouth daily. Start taking on: November 08, 2022   albuterol 108 (90 Base) MCG/ACT inhaler Commonly known as: VENTOLIN HFA Inhale 2 puffs into the lungs every 6 (six) hours as needed for wheezing or shortness of breath.   allopurinol 100 MG tablet Commonly known as: ZYLOPRIM Take 0.5 tablets (50 mg total) by mouth daily. Start taking on: November 08, 2022   budesonide-formoterol 80-4.5 MCG/ACT inhaler Commonly known as: SYMBICORT Inhale 2 puffs into the lungs 2 (two) times daily as needed (shortness of breath).   cholecalciferol 25 MCG (1000 UNIT) tablet Commonly known as: VITAMIN D3 Take 1,000 Units by mouth daily.   cyanocobalamin 1000 MCG tablet Commonly known as: VITAMIN B12 Take 1,000 mcg by mouth daily.   fluconazole 100 MG tablet Commonly known as: DIFLUCAN Take 1 tablet (100 mg total) by mouth daily. Start taking on: November 08, 2022   hydroxyurea 500 MG capsule Commonly known as: HYDREA Take 2 capsules (1,000 mg total) by mouth once for 1 dose. May take with food to minimize GI side effects.   ipratropium-albuterol 0.5-2.5 (3) MG/3ML Soln Commonly known as: DUONEB Take 3 mLs by nebulization every 4 (four) hours as needed.   levofloxacin 500 MG tablet Commonly known as: LEVAQUIN Take 1 tablet (500 mg total) by mouth every other day. Start taking on: November 08, 2022   multivitamin Tabs tablet Take 1 tablet by mouth daily.   pantoprazole 40 MG tablet Commonly known as: PROTONIX Take 1 tablet (40 mg total) by mouth daily.   QUEtiapine 50 MG tablet Commonly known as: SEROQUEL Take 1 tablet (  50 mg total) by mouth 2 (two) times daily.   valACYclovir 500 MG tablet Commonly known as: VALTREX Take 1 tablet (500 mg total) by mouth daily.         Follow-up Information     Ardis Hughs, MD. Schedule an appointment as soon as possible for a visit.   Specialty: Urology Why: To discuss removal of kidney stone Contact information: [place] [phone] [phone]                  Time coordinating discharge: 45 min  Signed:  Geradine Girt DO  [facility] 11/07/2022, 12:19 PM

## 2022-11-07 NOTE — Progress Notes (Signed)
Palliative Care Progress Note, Assessment & Plan   Patient Name: Ernest Barnett       Date: 11/07/2022 DOB: Apr 01, 1956  Age: 67 y.o. MRN#: [id number] Attending Physician: Ernest Girt, DO Primary Care Physician: Ernest Spar, MD Admit Date: 10/29/2022  Subjective: Patient is lying in bed in NAD.  He is asleep, easily arousable, and quickly falls back to sleep.  During my encounter the phlebotomist attempted to draw blood and patient refused.  Patient's daughter and significant other are at bedside.  HPI: 67 y.o. male  with past medical history of morbid obesity, obesity hypoventilation syndrome, ESRD (PD), HTN, noncompliant with CPAP, and HLD admitted to [facility], ED on 10/29/2022 with AMS.  In [facility] ED, patient noted to have fever of 103F and 4 mm right ureteral stone with hydroureteronephrosis.  Patient was transferred to [facility] ED for further evaluation.  Patient also found to be positive for influenza A.   Patient is being treated for severe sepsis due to complicated UTI in the setting of obstructive uropathy and acute septic encephalopathy.   Patient also has history of multiple myeloma and now has severe anemia, thrombocytopenia, and leukocytosis.  Dr. Benay Spice is following and bone marrow biopsy performed and preliminary result consistent with AML.    PMT was consulted to discuss goals of care.  Summary of counseling/coordination of care: After reviewing the patient's chart and assessing the patient at bedside, I discussed goals of care and treatment plan with patient and family.  I spoke with patient's significant other caring and his daughter Ernest Barnett in person as well as his son Ernest Barnett via speaker phone.  Brief medical update given.  Discussion of treatment options in light of patient's  values and wishes.  Family shares that if patient was of clear minded today that he would likely not want to move forward with treatment.  However, when asked by Dr. Rondel Oh this morning patient said yes he would be accepting of treatment.  I attempted to have patient awaken and engage in discussions.  However, he did not stay awake and participate independently.  Aggressive medical treatment of leukemia versus aging in place at home/focusing on comfort with hospice reviewed in detail.  Family was in agreement that they would not want to do anything to take more from the patient than what medical treatment could be giving to him.  Quality of life versus quantity of life discussed.  Family asked appropriate questions regarding hospice care.  Ernest Barnett is a former [employer] and knowledgeable of hospice services.  Family shares they would like time to deliberate and discuss options as well as hopefully have patient participate in discussions.  Attending made aware of discussions with family.  PMT will continue to follow and discuss [place].   Addendum:  Dr. Eliseo Barnett confirmed with patient and conveyed to this provider that patient wants to proceed with transfer to [facility] and treatment of AML. PMT will remain available to patient/family throughout his hospitalization.   Physical Exam Vitals reviewed.  Constitutional:      General: He is not in acute distress.    Appearance: He is obese.  HENT:     Head: Normocephalic.  Eyes:  Pupils: Pupils are equal, round, and reactive to light.  Cardiovascular:     Rate and Rhythm: Normal rate.     Pulses: Normal pulses.  Pulmonary:     Effort: Pulmonary effort is normal.  Abdominal:     Palpations: Abdomen is soft.  Musculoskeletal:     Comments: Generalized weakness  Skin:    General: Skin is warm and dry.     Comments: PD catheter in place             Palliative Assessment/Data: 40%    Total Time 50 minutes   Thank you for allowing the  Palliative Medicine Team to assist in the care of this patient.  Schlusser Ilsa Iha, FNP-BC Palliative Medicine Team Team Phone # [phone]

## 2022-11-07 NOTE — Progress Notes (Signed)
Patient refused his night time medication tonight.

## 2022-11-07 NOTE — Progress Notes (Signed)
IP PROGRESS NOTE  Subjective:   [name] is alert.  He has son, daughter, and significant other are at the bedside.  He tolerated the bone marrow biopsy well.  No bleeding.  No pain.  He declined hydroxyurea yesterday.  Objective: Vital signs in last 24 hours: Blood pressure 118/69, pulse (!) 119, temperature (S) 98.7 F (37.1 C), temperature source Oral, resp. rate (!) 24, weight 253 lb 15.5 oz (115.2 kg), SpO2 94 %.  Intake/Output from previous day: 01/03 0701 - 01/04 0700 In: 734.9 [P.O.:640; IV Piggyback:94.9] Out: 910 [Urine:909; Stool:1]  Physical Exam:  HEENT: No thrush or bleeding Lungs: Scattered bilateral inspiratory/expiratory wheeze and rhonchi, no respiratory distress Cardiac: Tachycardia, regular rhythm Abdomen: Nontender, no hepatosplenomegaly, right abdomen dialysis catheter site with a gauze dressing Extremities: No leg edema Neurologic: Alert, hard of hearing, cooperative, conversant Skin: Few ecchymoses over the arms, small ecchymosis at the iliac bone marrow site   Lab Results: Recent Labs    11/05/22 1659 11/06/22 0416  WBC 125.3* 123.4*  HGB 6.6* 6.3*  HCT 19.9* 19.8*  PLT 37* 20*  Peripheral blood smear from 11/04/2021-marked leukocytosis, the majority the white cells are urged mononuclear cells with a "blast "like appearance.  Rare neutrophil.  Some of the mononuclear cells are bilobed.  Increased number of basophils.  The platelets appear decreased in number.  Few nucleated red cells.  BMET Recent Labs    11/05/22 1508 11/06/22 0416  NA 140 141  K 4.0 3.6  CL 109 111  CO2 17* 19*  GLUCOSE 106* 115*  BUN 45* 43*  CREATININE 5.30* 5.22*  CALCIUM 8.0* 8.2*    Medications: I have reviewed the patient's current medications.  Assessment/Plan:  Admission with sepsis syndrome Influenza A/pneumonia 1 blood culture positive for gram-positive cocci in the aerobic bottle only IgG kappa multiple myeloma, currently maintained off of specific  therapy Peripheral blood lymphocytosis admission with peripheral blood smear consistent with plasmacytosis Severe neutropenia, anemia, and thrombocytopenia Granix 11/01/2022 - 11/03/2022 End-stage renal disease maintained on peritoneal hemodialysis Right posterior scleritis September 2023, status post right eye enucleation 08/28/2022 COPD Hypertension Status post splenectomy Mild right hydronephrosis/right ureter stone on CT abdomen/pelvis 10/29/2022, placement of right ureter stent 10/30/2022 Encephalopathy secondary to critical illness Acute leukemia -marked lymphocytosis with blast population on peripheral blood smear beginning 11/04/2022, hydroxyurea started 11/05/2022 Severe neutropenia secondary to leukemia Fever 11/06/2022-tumor fever?,  On Levaquin, Diflucan, valacyclovir prophylaxis  [name] with sepsis syndrome related to influenza infection.  The sepsis has resolved. He has a history of multiple myeloma.  He now has severe anemia/thrombocytopenia and leukocytosis.  The peripheral blood smear findings are consistent with conversion to an acute leukemic process..  Flow cytometry submitted on 10/31/2022 reveals changes consistent with AML versus plasmablastic leukemia.  He underwent a bone marrow biopsy yesterday.  Preliminary review of the bone marrow biopsy is consistent with AML.  Flow cytometry and molecular studies are pending.    I discussed the acute leukemia diagnosis, prognosis, treatment options with [name] and his family.  The prognosis for survival beyond days or a few weeks is very poor in the absence of treatment.  We reviewed standard treatment of acute leukemia.  Treatment options will be complicated in his case by comorbid conditions.  He may be a candidate for treatment with 5- azacytidine, decitabine, +/- venetoclax.   I contacted Dr. Linus Orn at the leukemia service at [facility]  We reviewed the case.  He agrees to accept [name] in transfer  today.   Recommendations: Hydroxyurea this morning Follow-up bone marrow aspirate/biopsy result today Follow-up CBC this a.m. Transfer to the adult leukemia service at [facility] today    LOS: 9 days   Betsy Coder, MD   11/07/2022, 8:15 AM

## 2022-11-08 DIAGNOSIS — N185 Chronic kidney disease, stage 5: Secondary | ICD-10-CM | POA: Diagnosis not present

## 2022-11-08 DIAGNOSIS — H5789 Other specified disorders of eye and adnexa: Secondary | ICD-10-CM | POA: Diagnosis not present

## 2022-11-08 DIAGNOSIS — J690 Pneumonitis due to inhalation of food and vomit: Secondary | ICD-10-CM | POA: Diagnosis not present

## 2022-11-08 DIAGNOSIS — J9602 Acute respiratory failure with hypercapnia: Secondary | ICD-10-CM | POA: Diagnosis not present

## 2022-11-08 DIAGNOSIS — Z781 Physical restraint status: Secondary | ICD-10-CM | POA: Diagnosis not present

## 2022-11-08 DIAGNOSIS — M792 Neuralgia and neuritis, unspecified: Secondary | ICD-10-CM | POA: Diagnosis not present

## 2022-11-08 DIAGNOSIS — C9500 Acute leukemia of unspecified cell type not having achieved remission: Secondary | ICD-10-CM | POA: Diagnosis not present

## 2022-11-08 DIAGNOSIS — R600 Localized edema: Secondary | ICD-10-CM | POA: Diagnosis not present

## 2022-11-08 DIAGNOSIS — D61810 Antineoplastic chemotherapy induced pancytopenia: Secondary | ICD-10-CM | POA: Diagnosis not present

## 2022-11-08 DIAGNOSIS — I444 Left anterior fascicular block: Secondary | ICD-10-CM | POA: Diagnosis not present

## 2022-11-08 DIAGNOSIS — R5081 Fever presenting with conditions classified elsewhere: Secondary | ICD-10-CM | POA: Diagnosis not present

## 2022-11-08 DIAGNOSIS — L539 Erythematous condition, unspecified: Secondary | ICD-10-CM | POA: Diagnosis not present

## 2022-11-08 DIAGNOSIS — Z992 Dependence on renal dialysis: Secondary | ICD-10-CM | POA: Diagnosis not present

## 2022-11-08 DIAGNOSIS — Z720 Tobacco use: Secondary | ICD-10-CM | POA: Diagnosis not present

## 2022-11-08 DIAGNOSIS — E8770 Fluid overload, unspecified: Secondary | ICD-10-CM | POA: Diagnosis not present

## 2022-11-08 DIAGNOSIS — Z6841 Body Mass Index (BMI) 40.0 and over, adult: Secondary | ICD-10-CM | POA: Diagnosis not present

## 2022-11-08 DIAGNOSIS — C9300 Acute monoblastic/monocytic leukemia, not having achieved remission: Secondary | ICD-10-CM | POA: Diagnosis not present

## 2022-11-08 DIAGNOSIS — J9601 Acute respiratory failure with hypoxia: Secondary | ICD-10-CM | POA: Diagnosis not present

## 2022-11-08 DIAGNOSIS — C9200 Acute myeloblastic leukemia, not having achieved remission: Secondary | ICD-10-CM | POA: Diagnosis not present

## 2022-11-08 DIAGNOSIS — K219 Gastro-esophageal reflux disease without esophagitis: Secondary | ICD-10-CM | POA: Diagnosis not present

## 2022-11-08 DIAGNOSIS — R1312 Dysphagia, oropharyngeal phase: Secondary | ICD-10-CM | POA: Diagnosis not present

## 2022-11-08 DIAGNOSIS — D539 Nutritional anemia, unspecified: Secondary | ICD-10-CM | POA: Diagnosis not present

## 2022-11-08 DIAGNOSIS — R6521 Severe sepsis with septic shock: Secondary | ICD-10-CM | POA: Diagnosis not present

## 2022-11-08 DIAGNOSIS — R197 Diarrhea, unspecified: Secondary | ICD-10-CM | POA: Diagnosis not present

## 2022-11-08 DIAGNOSIS — T451X5A Adverse effect of antineoplastic and immunosuppressive drugs, initial encounter: Secondary | ICD-10-CM | POA: Diagnosis not present

## 2022-11-08 DIAGNOSIS — R4182 Altered mental status, unspecified: Secondary | ICD-10-CM | POA: Diagnosis not present

## 2022-11-08 DIAGNOSIS — I69354 Hemiplegia and hemiparesis following cerebral infarction affecting left non-dominant side: Secondary | ICD-10-CM | POA: Diagnosis not present

## 2022-11-08 DIAGNOSIS — D6959 Other secondary thrombocytopenia: Secondary | ICD-10-CM | POA: Diagnosis not present

## 2022-11-08 DIAGNOSIS — D649 Anemia, unspecified: Secondary | ICD-10-CM | POA: Diagnosis not present

## 2022-11-08 DIAGNOSIS — Z66 Do not resuscitate: Secondary | ICD-10-CM | POA: Diagnosis not present

## 2022-11-08 DIAGNOSIS — Z515 Encounter for palliative care: Secondary | ICD-10-CM | POA: Diagnosis not present

## 2022-11-08 DIAGNOSIS — E872 Acidosis, unspecified: Secondary | ICD-10-CM | POA: Diagnosis not present

## 2022-11-08 DIAGNOSIS — J1000 Influenza due to other identified influenza virus with unspecified type of pneumonia: Secondary | ICD-10-CM | POA: Diagnosis not present

## 2022-11-08 DIAGNOSIS — D696 Thrombocytopenia, unspecified: Secondary | ICD-10-CM | POA: Diagnosis not present

## 2022-11-08 DIAGNOSIS — F17200 Nicotine dependence, unspecified, uncomplicated: Secondary | ICD-10-CM | POA: Diagnosis not present

## 2022-11-08 DIAGNOSIS — N1330 Unspecified hydronephrosis: Secondary | ICD-10-CM | POA: Diagnosis not present

## 2022-11-08 DIAGNOSIS — D849 Immunodeficiency, unspecified: Secondary | ICD-10-CM | POA: Diagnosis not present

## 2022-11-08 DIAGNOSIS — E785 Hyperlipidemia, unspecified: Secondary | ICD-10-CM | POA: Diagnosis not present

## 2022-11-08 DIAGNOSIS — E662 Morbid (severe) obesity with alveolar hypoventilation: Secondary | ICD-10-CM | POA: Diagnosis not present

## 2022-11-08 DIAGNOSIS — Z5111 Encounter for antineoplastic chemotherapy: Secondary | ICD-10-CM | POA: Diagnosis not present

## 2022-11-08 DIAGNOSIS — R000 Tachycardia, unspecified: Secondary | ICD-10-CM | POA: Diagnosis not present

## 2022-11-08 DIAGNOSIS — Z1152 Encounter for screening for COVID-19: Secondary | ICD-10-CM | POA: Diagnosis not present

## 2022-11-08 DIAGNOSIS — N186 End stage renal disease: Secondary | ICD-10-CM | POA: Diagnosis not present

## 2022-11-08 DIAGNOSIS — R633 Feeding difficulties, unspecified: Secondary | ICD-10-CM | POA: Diagnosis not present

## 2022-11-08 DIAGNOSIS — I959 Hypotension, unspecified: Secondary | ICD-10-CM | POA: Diagnosis not present

## 2022-11-08 DIAGNOSIS — G9341 Metabolic encephalopathy: Secondary | ICD-10-CM | POA: Diagnosis not present

## 2022-11-08 DIAGNOSIS — J449 Chronic obstructive pulmonary disease, unspecified: Secondary | ICD-10-CM | POA: Diagnosis not present

## 2022-11-08 DIAGNOSIS — D61818 Other pancytopenia: Secondary | ICD-10-CM | POA: Diagnosis not present

## 2022-11-08 DIAGNOSIS — J9811 Atelectasis: Secondary | ICD-10-CM | POA: Diagnosis not present

## 2022-11-08 DIAGNOSIS — Z01810 Encounter for preprocedural cardiovascular examination: Secondary | ICD-10-CM | POA: Diagnosis not present

## 2022-11-08 DIAGNOSIS — J440 Chronic obstructive pulmonary disease with acute lower respiratory infection: Secondary | ICD-10-CM | POA: Diagnosis not present

## 2022-11-08 DIAGNOSIS — D72829 Elevated white blood cell count, unspecified: Secondary | ICD-10-CM | POA: Diagnosis not present

## 2022-11-08 DIAGNOSIS — R579 Shock, unspecified: Secondary | ICD-10-CM | POA: Diagnosis not present

## 2022-11-08 DIAGNOSIS — E860 Dehydration: Secondary | ICD-10-CM | POA: Diagnosis not present

## 2022-11-08 DIAGNOSIS — A4189 Other specified sepsis: Secondary | ICD-10-CM | POA: Diagnosis not present

## 2022-11-08 DIAGNOSIS — Z95828 Presence of other vascular implants and grafts: Secondary | ICD-10-CM | POA: Diagnosis not present

## 2022-11-08 DIAGNOSIS — D709 Neutropenia, unspecified: Secondary | ICD-10-CM | POA: Diagnosis not present

## 2022-11-08 DIAGNOSIS — E2740 Unspecified adrenocortical insufficiency: Secondary | ICD-10-CM | POA: Diagnosis not present

## 2022-11-08 DIAGNOSIS — J810 Acute pulmonary edema: Secondary | ICD-10-CM | POA: Diagnosis not present

## 2022-11-08 DIAGNOSIS — R69 Illness, unspecified: Secondary | ICD-10-CM | POA: Diagnosis not present

## 2022-11-08 DIAGNOSIS — E883 Tumor lysis syndrome: Secondary | ICD-10-CM | POA: Diagnosis not present

## 2022-11-08 DIAGNOSIS — D631 Anemia in chronic kidney disease: Secondary | ICD-10-CM | POA: Diagnosis not present

## 2022-11-08 DIAGNOSIS — N132 Hydronephrosis with renal and ureteral calculous obstruction: Secondary | ICD-10-CM | POA: Diagnosis not present

## 2022-11-08 DIAGNOSIS — N400 Enlarged prostate without lower urinary tract symptoms: Secondary | ICD-10-CM | POA: Diagnosis not present

## 2022-11-08 DIAGNOSIS — C9010 Plasma cell leukemia not having achieved remission: Secondary | ICD-10-CM | POA: Diagnosis not present

## 2022-11-08 DIAGNOSIS — N2581 Secondary hyperparathyroidism of renal origin: Secondary | ICD-10-CM | POA: Diagnosis not present

## 2022-11-08 DIAGNOSIS — E875 Hyperkalemia: Secondary | ICD-10-CM | POA: Diagnosis not present

## 2022-11-08 DIAGNOSIS — J811 Chronic pulmonary edema: Secondary | ICD-10-CM | POA: Diagnosis not present

## 2022-11-08 DIAGNOSIS — R7981 Abnormal blood-gas level: Secondary | ICD-10-CM | POA: Diagnosis not present

## 2022-11-08 DIAGNOSIS — R918 Other nonspecific abnormal finding of lung field: Secondary | ICD-10-CM | POA: Diagnosis not present

## 2022-11-08 DIAGNOSIS — I120 Hypertensive chronic kidney disease with stage 5 chronic kidney disease or end stage renal disease: Secondary | ICD-10-CM | POA: Diagnosis not present

## 2022-11-08 DIAGNOSIS — A419 Sepsis, unspecified organism: Secondary | ICD-10-CM | POA: Diagnosis not present

## 2022-11-08 DIAGNOSIS — R410 Disorientation, unspecified: Secondary | ICD-10-CM | POA: Diagnosis not present

## 2022-11-08 DIAGNOSIS — G8929 Other chronic pain: Secondary | ICD-10-CM | POA: Diagnosis not present

## 2022-11-08 DIAGNOSIS — C9000 Multiple myeloma not having achieved remission: Secondary | ICD-10-CM | POA: Diagnosis not present

## 2022-11-08 LAB — BASIC METABOLIC PANEL
Anion gap: 12 (ref 5–15)
BUN: 36 mg/dL — ABNORMAL HIGH (ref 8–23)
CO2: 19 mmol/L — ABNORMAL LOW (ref 22–32)
Calcium: 8.5 mg/dL — ABNORMAL LOW (ref 8.9–10.3)
Chloride: 109 mmol/L (ref 98–111)
Creatinine, Ser: 4.64 mg/dL — ABNORMAL HIGH (ref 0.61–1.24)
GFR, Estimated: 13 mL/min — ABNORMAL LOW (ref >60)
Glucose, Bld: 119 mg/dL — ABNORMAL HIGH (ref 70–99)
Potassium: 3.2 mmol/L — ABNORMAL LOW (ref 3.5–5.1)
Sodium: 140 mmol/L (ref 135–145)

## 2022-11-08 LAB — TYPE AND SCREEN
ABO/RH(D): A POS
Antibody Screen: NEGATIVE
Unit division: 0

## 2022-11-08 LAB — CBC WITH DIFFERENTIAL/PLATELET
Abs Immature Granulocytes: 0 10*3/uL (ref 0.00–0.07)
Basophils Absolute: 0 10*3/uL (ref 0.0–0.1)
Basophils Relative: 0 %
Eosinophils Absolute: 0 10*3/uL (ref 0.0–0.5)
Eosinophils Relative: 0 %
HCT: 20.9 % — ABNORMAL LOW (ref 39.0–52.0)
Hemoglobin: 7.3 g/dL — ABNORMAL LOW (ref 13.0–17.0)
Lymphocytes Relative: 9 %
Lymphs Abs: 7.2 10*3/uL — ABNORMAL HIGH (ref 0.7–4.0)
MCH: 33.2 pg (ref 26.0–34.0)
MCHC: 34.9 g/dL (ref 30.0–36.0)
MCV: 95 fL (ref 80.0–100.0)
Monocytes Absolute: 4 10*3/uL — ABNORMAL HIGH (ref 0.1–1.0)
Monocytes Relative: 5 %
Neutro Abs: 0 10*3/uL — CL (ref 1.7–7.7)
Neutrophils Relative %: 0 %
Other: 86 %
Platelets: 12 10*3/uL — CL (ref 150–400)
RBC: 2.2 MIL/uL — ABNORMAL LOW (ref 4.22–5.81)
RDW: 24 % — ABNORMAL HIGH (ref 11.5–15.5)
WBC: 80 10*3/uL (ref 4.0–10.5)
nRBC: 0.1 % (ref 0.0–0.2)

## 2022-11-08 LAB — URIC ACID: Uric Acid, Serum: 6.8 mg/dL (ref 3.7–8.6)

## 2022-11-08 LAB — SURGICAL PATHOLOGY

## 2022-11-08 LAB — BPAM RBC
Blood Product Expiration Date: 202401232359
ISSUE DATE / TIME: 202401041629
Unit Type and Rh: 6200

## 2022-11-08 LAB — PHOSPHORUS: Phosphorus: 2.7 mg/dL (ref 2.5–4.6)

## 2022-11-08 NOTE — Progress Notes (Signed)
Patient pending transport to [facility] this AM.  Bed [id number] per transfer center.  Labs pending as well this AM. Son at bedside.  Eulogio Bear DO

## 2022-11-08 NOTE — Progress Notes (Signed)
                                                     Palliative Care Progress Note   Patient Name: [name]       Date: 11/08/2022 DOB: 04-03-1956  Age: 67 y.o. MRN#: [id number] Attending Physician: Geradine Girt, DO Primary Care Physician: Lindell Spar, MD Admit Date: 10/29/2022  Patient is awaiting transfer to [place].   No acute palliative needs at this time.   PMT will shadow the chart and monitor peripherally until discharge.   Please re-engage with PMT if goals change, if patient/family requests, or if patient's health deteriorates during hospitalization.   Thank you for allowing the Palliative Medicine Team to assist in the care of Ernest Barnett.  [place] Ilsa Iha, FNP-BC Palliative Medicine Team Team Phone # [phone]  NO CHARGE

## 2022-11-11 ENCOUNTER — Encounter (HOSPITAL_COMMUNITY): Payer: Self-pay | Admitting: Oncology

## 2022-11-11 ENCOUNTER — Encounter: Payer: Self-pay | Admitting: *Deleted

## 2022-11-11 NOTE — Progress Notes (Signed)
Molecular results faxed to Dr Linus Orn at [facility] at [phone]

## 2022-11-12 ENCOUNTER — Encounter (HOSPITAL_COMMUNITY): Payer: Self-pay | Admitting: Oncology

## 2022-11-12 LAB — PATHOLOGIST SMEAR REVIEW

## 2022-11-13 ENCOUNTER — Inpatient Hospital Stay: Payer: Medicare HMO | Admitting: Hematology

## 2022-11-13 ENCOUNTER — Inpatient Hospital Stay: Payer: Medicare HMO

## 2022-11-13 ENCOUNTER — Encounter (HOSPITAL_COMMUNITY): Payer: Self-pay | Admitting: Oncology

## 2022-11-14 ENCOUNTER — Inpatient Hospital Stay: Payer: Medicare HMO

## 2022-11-14 ENCOUNTER — Encounter (HOSPITAL_COMMUNITY): Payer: Self-pay | Admitting: Oncology

## 2022-11-15 ENCOUNTER — Encounter (HOSPITAL_COMMUNITY): Payer: Self-pay | Admitting: Oncology

## 2022-11-16 ENCOUNTER — Other Ambulatory Visit (HOSPITAL_COMMUNITY): Payer: Self-pay | Admitting: Hematology

## 2022-11-16 ENCOUNTER — Other Ambulatory Visit: Payer: Self-pay | Admitting: Internal Medicine

## 2022-11-18 ENCOUNTER — Encounter: Payer: Self-pay | Admitting: Hematology

## 2022-11-19 ENCOUNTER — Encounter (HOSPITAL_COMMUNITY): Payer: Self-pay

## 2022-11-19 IMAGING — US IR FLUORO GUIDE CV LINE*L*
1 series · 2 of 2 positions shown · non-contrast
Comparison: none

INDICATION: End-stage renal disease. In need of durable intravenous access for
the initiation of hemodialysis.

[Series 1: ir fluoro guide cv line*left* · 2 of 2 slices shown]
[im 1/2]
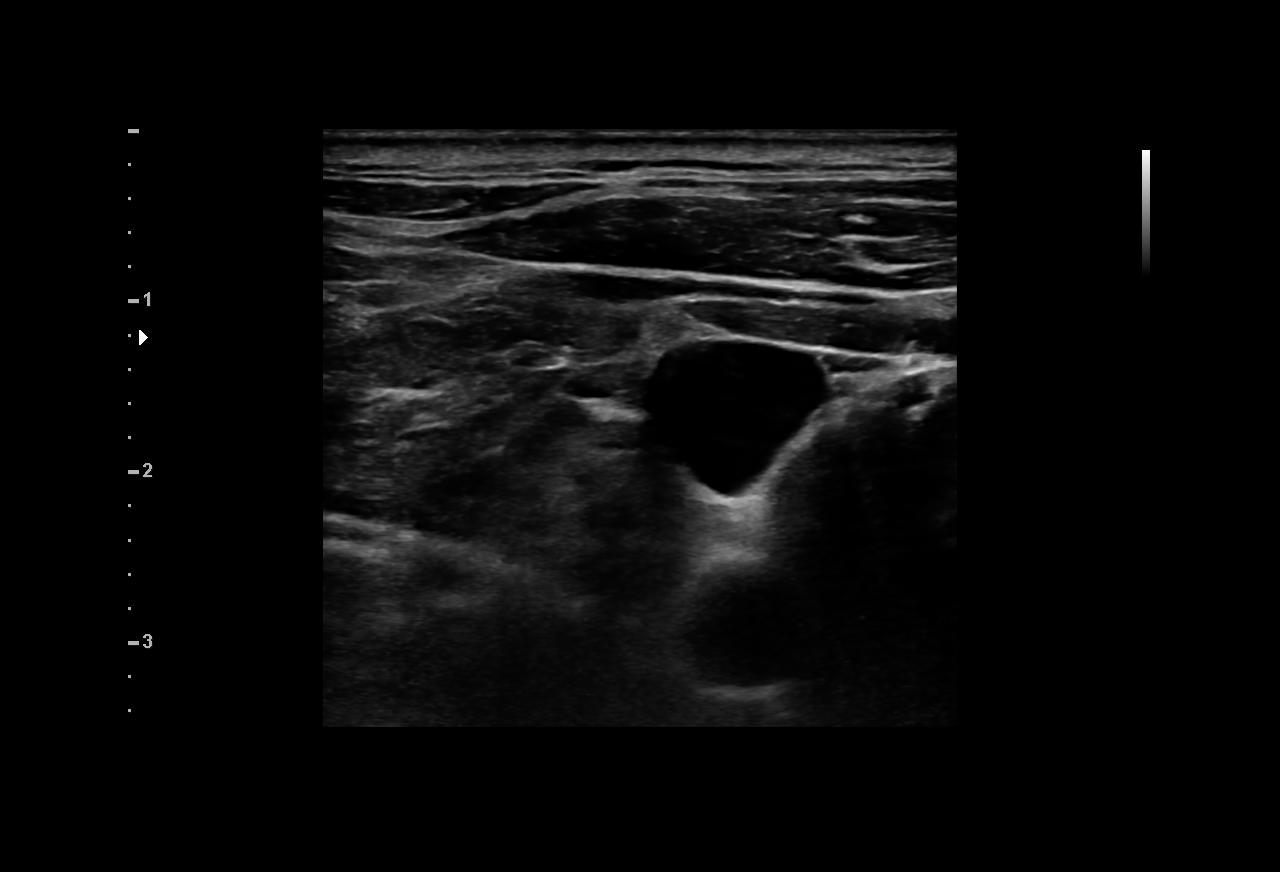
[im 2/2]
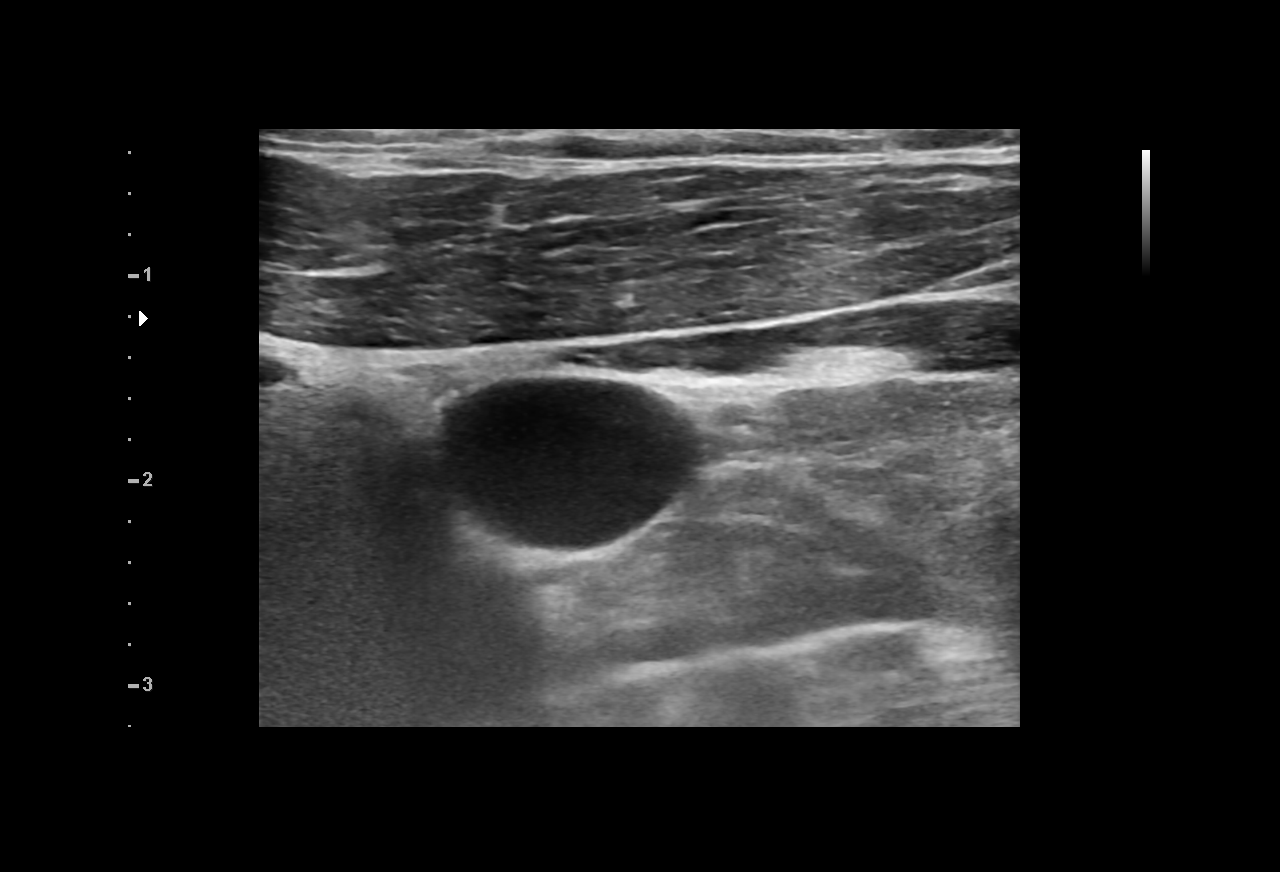

[2 of 2 positions shown; findings below may reference images not displayed]

EXAM:
TUNNELED CENTRAL VENOUS HEMODIALYSIS CATHETER PLACEMENT WITH
ULTRASOUND AND FLUOROSCOPIC GUIDANCE

MEDICATIONS:
Ancef 2 gm IV . The antibiotic was given in an appropriate time
interval prior to skin puncture.

ANESTHESIA/SEDATION:
Moderate (conscious) sedation was employed during this procedure as
administered by the [REDACTED].

A total of Versed 1.5 mg and Fentanyl 75 mcg was administered
intravenously.

Moderate Sedation Time: 29 minutes. The patient's level of
consciousness and vital signs were monitored continuously by
radiology nursing throughout the procedure under my direct
supervision.

FLUOROSCOPY TIME:  3 minutes, 42 seconds (52 mGy)

COMPLICATIONS:
None immediate.

PROCEDURE:
Informed written consent was obtained from the patient after a
discussion of the risks, benefits, and alternatives to treatment.
Questions regarding the procedure were encouraged and answered.
Given the presence of the maturing right forearm AV fistula, the
decision was made to proceed with left internal jugular approach
dialysis catheter placement as to not impede central venous flow
from the right upper extremity.

As such, the left neck and chest were prepped with chlorhexidine in
a sterile fashion, and a sterile drape was applied covering the
operative field. Maximum barrier sterile technique with sterile
gowns and gloves were used for the procedure. A timeout was
performed prior to the initiation of the procedure.

After creating a small venotomy incision, a micropuncture kit was
utilized to access the internal jugular vein. Real-time ultrasound
guidance was utilized for vascular access including the acquisition
of a permanent ultrasound image documenting patency of the accessed
vessel. The microwire was utilized to measure appropriate catheter
length.

A stiff Glidewire was advanced to the level of the IVC and the
micropuncture sheath was exchanged for a peel-away sheath.
Initially, a palindrome tunneled hemodialysis catheter measuring 23
cm from tip to cuff was tunneled in a retrograde fashion from the
anterior chest wall to the venotomy incision.

The catheter was then placed through the peel-away sheath with tips
ultimately positioned within the mid SVC. As such, decision was made
to exchange the catheter for a slightly longer hemodialysis
catheter.

As such, both lumens of the dialysis catheter was cannulated with
stiff glide wires which were advanced to the level of the main
pulmonary artery.

Next, under intermittent fluoroscopic guidance, the existing
dialysis catheter was exchanged for a new, slightly longer now 28 cm
palindrome hemodialysis catheter with tip ultimately terminating
within the superior aspect of the right atrium. Final catheter
positioning was confirmed and documented with a spot radiographic
image. The catheter aspirates and flushes normally. The catheter was
flushed with appropriate volume heparin dwells.

The catheter exit site was secured with a 0-Prolene retention
suture. The venotomy incision was closed with Dermabond and
Amikko. Dressings were applied. The patient tolerated the
procedure well without immediate post procedural complication.
IMPRESSION: Successful placement of 28 cm tip to cuff tunneled hemodialysis
catheter via the left internal jugular vein with tips terminating
within the superior aspect of the right atrium. The catheter is
ready for immediate use.

Note, a left internal jugular approach dialysis catheter was placed
secondary to the presence of a maturing right forearm AV fistula.

## 2022-11-21 DIAGNOSIS — C9200 Acute myeloblastic leukemia, not having achieved remission: Secondary | ICD-10-CM | POA: Diagnosis not present

## 2022-11-21 DIAGNOSIS — R5081 Fever presenting with conditions classified elsewhere: Secondary | ICD-10-CM | POA: Diagnosis not present

## 2022-11-21 DIAGNOSIS — D709 Neutropenia, unspecified: Secondary | ICD-10-CM | POA: Diagnosis not present

## 2022-11-21 DIAGNOSIS — Z5111 Encounter for antineoplastic chemotherapy: Secondary | ICD-10-CM | POA: Diagnosis not present

## 2022-11-21 DIAGNOSIS — J9691 Respiratory failure, unspecified with hypoxia: Secondary | ICD-10-CM | POA: Diagnosis not present

## 2022-11-21 DIAGNOSIS — J111 Influenza due to unidentified influenza virus with other respiratory manifestations: Secondary | ICD-10-CM | POA: Diagnosis not present

## 2022-11-21 DIAGNOSIS — N186 End stage renal disease: Secondary | ICD-10-CM | POA: Diagnosis not present

## 2022-11-21 DIAGNOSIS — A419 Sepsis, unspecified organism: Secondary | ICD-10-CM | POA: Diagnosis not present

## 2022-11-21 DIAGNOSIS — Z992 Dependence on renal dialysis: Secondary | ICD-10-CM | POA: Diagnosis not present

## 2022-11-21 DIAGNOSIS — J811 Chronic pulmonary edema: Secondary | ICD-10-CM | POA: Diagnosis not present

## 2022-11-21 DIAGNOSIS — R6521 Severe sepsis with septic shock: Secondary | ICD-10-CM | POA: Diagnosis not present

## 2022-12-02 ENCOUNTER — Inpatient Hospital Stay: Payer: Medicare HMO

## 2022-12-05 DEATH — deceased

## 2023-05-01 ENCOUNTER — Ambulatory Visit: Payer: Medicare HMO | Admitting: Cardiology
